# Patient Record
Sex: Male | Born: 1940 | Race: White | Hispanic: No | Marital: Married | Smoking: Never smoker
Health system: Southern US, Community
[De-identification: ages and names within clinical notes are randomized; demographics above are authoritative.]

## PROBLEM LIST (undated history)

## (undated) DIAGNOSIS — C801 Malignant (primary) neoplasm, unspecified: Secondary | ICD-10-CM

## (undated) DIAGNOSIS — I10 Essential (primary) hypertension: Secondary | ICD-10-CM

## (undated) DIAGNOSIS — Z8673 Personal history of transient ischemic attack (TIA), and cerebral infarction without residual deficits: Secondary | ICD-10-CM

## (undated) DIAGNOSIS — I219 Acute myocardial infarction, unspecified: Secondary | ICD-10-CM

## (undated) DIAGNOSIS — R42 Dizziness and giddiness: Secondary | ICD-10-CM

## (undated) DIAGNOSIS — N184 Chronic kidney disease, stage 4 (severe): Secondary | ICD-10-CM

## (undated) DIAGNOSIS — E785 Hyperlipidemia, unspecified: Secondary | ICD-10-CM

## (undated) DIAGNOSIS — I951 Orthostatic hypotension: Secondary | ICD-10-CM

## (undated) DIAGNOSIS — R0602 Shortness of breath: Secondary | ICD-10-CM

## (undated) DIAGNOSIS — F0390 Unspecified dementia without behavioral disturbance: Secondary | ICD-10-CM

## (undated) DIAGNOSIS — D649 Anemia, unspecified: Secondary | ICD-10-CM

## (undated) DIAGNOSIS — Z941 Heart transplant status: Secondary | ICD-10-CM

## (undated) DIAGNOSIS — I639 Cerebral infarction, unspecified: Secondary | ICD-10-CM

## (undated) DIAGNOSIS — E119 Type 2 diabetes mellitus without complications: Secondary | ICD-10-CM

## (undated) DIAGNOSIS — K439 Ventral hernia without obstruction or gangrene: Secondary | ICD-10-CM

## (undated) DIAGNOSIS — Z9289 Personal history of other medical treatment: Secondary | ICD-10-CM

## (undated) DIAGNOSIS — I2510 Atherosclerotic heart disease of native coronary artery without angina pectoris: Secondary | ICD-10-CM

## (undated) DIAGNOSIS — R51 Headache: Secondary | ICD-10-CM

## (undated) HISTORY — PX: CATARACT EXTRACTION: SUR2

## (undated) HISTORY — PX: EYE SURGERY: SHX253

## (undated) HISTORY — PX: CORONARY ARTERY BYPASS GRAFT: SHX141

## (undated) HISTORY — PX: HEART TRANSPLANT: SHX268

## (undated) HISTORY — PX: SKIN GRAFT FULL THICKNESS LEG: SUR1299

## (undated) HISTORY — PX: CARDIAC SURGERY: SHX584

## (undated) HISTORY — PX: VASECTOMY: SHX75

---

## 2013-06-20 DIAGNOSIS — I639 Cerebral infarction, unspecified: Secondary | ICD-10-CM

## 2013-06-20 HISTORY — DX: Cerebral infarction, unspecified: I63.9

## 2014-06-20 ENCOUNTER — Encounter (HOSPITAL_COMMUNITY): Payer: Self-pay | Admitting: Emergency Medicine

## 2014-06-20 ENCOUNTER — Inpatient Hospital Stay (HOSPITAL_COMMUNITY)
Admission: EM | Admit: 2014-06-20 | Discharge: 2014-06-25 | DRG: 682 | Disposition: A | Discharge status: Home / Self Care | Payer: 59 | Attending: Internal Medicine | Admitting: Internal Medicine

## 2014-06-20 DIAGNOSIS — D631 Anemia in chronic kidney disease: Secondary | ICD-10-CM | POA: Diagnosis present

## 2014-06-20 DIAGNOSIS — Z951 Presence of aortocoronary bypass graft: Secondary | ICD-10-CM | POA: Diagnosis not present

## 2014-06-20 DIAGNOSIS — IMO0002 Reserved for concepts with insufficient information to code with codable children: Secondary | ICD-10-CM | POA: Diagnosis present

## 2014-06-20 DIAGNOSIS — N039 Chronic nephritic syndrome with unspecified morphologic changes: Secondary | ICD-10-CM

## 2014-06-20 DIAGNOSIS — I2510 Atherosclerotic heart disease of native coronary artery without angina pectoris: Secondary | ICD-10-CM | POA: Diagnosis present

## 2014-06-20 DIAGNOSIS — Z833 Family history of diabetes mellitus: Secondary | ICD-10-CM | POA: Diagnosis not present

## 2014-06-20 DIAGNOSIS — E440 Moderate protein-calorie malnutrition: Secondary | ICD-10-CM | POA: Diagnosis present

## 2014-06-20 DIAGNOSIS — E119 Type 2 diabetes mellitus without complications: Secondary | ICD-10-CM | POA: Diagnosis present

## 2014-06-20 DIAGNOSIS — E039 Hypothyroidism, unspecified: Secondary | ICD-10-CM

## 2014-06-20 DIAGNOSIS — N179 Acute kidney failure, unspecified: Secondary | ICD-10-CM

## 2014-06-20 DIAGNOSIS — Z8249 Family history of ischemic heart disease and other diseases of the circulatory system: Secondary | ICD-10-CM

## 2014-06-20 DIAGNOSIS — N186 End stage renal disease: Secondary | ICD-10-CM | POA: Diagnosis present

## 2014-06-20 DIAGNOSIS — B259 Cytomegaloviral disease, unspecified: Secondary | ICD-10-CM | POA: Diagnosis present

## 2014-06-20 DIAGNOSIS — I10 Essential (primary) hypertension: Secondary | ICD-10-CM | POA: Diagnosis not present

## 2014-06-20 DIAGNOSIS — Z9849 Cataract extraction status, unspecified eye: Secondary | ICD-10-CM

## 2014-06-20 DIAGNOSIS — I119 Hypertensive heart disease without heart failure: Secondary | ICD-10-CM | POA: Diagnosis present

## 2014-06-20 DIAGNOSIS — E1165 Type 2 diabetes mellitus with hyperglycemia: Secondary | ICD-10-CM | POA: Diagnosis present

## 2014-06-20 DIAGNOSIS — E7800 Pure hypercholesterolemia, unspecified: Secondary | ICD-10-CM

## 2014-06-20 DIAGNOSIS — E785 Hyperlipidemia, unspecified: Secondary | ICD-10-CM | POA: Diagnosis present

## 2014-06-20 DIAGNOSIS — Z7982 Long term (current) use of aspirin: Secondary | ICD-10-CM | POA: Diagnosis not present

## 2014-06-20 DIAGNOSIS — I951 Orthostatic hypotension: Secondary | ICD-10-CM | POA: Diagnosis present

## 2014-06-20 DIAGNOSIS — I129 Hypertensive chronic kidney disease with stage 1 through stage 4 chronic kidney disease, or unspecified chronic kidney disease: Secondary | ICD-10-CM

## 2014-06-20 DIAGNOSIS — I120 Hypertensive chronic kidney disease with stage 5 chronic kidney disease or end stage renal disease: Secondary | ICD-10-CM | POA: Diagnosis not present

## 2014-06-20 DIAGNOSIS — Z941 Heart transplant status: Secondary | ICD-10-CM

## 2014-06-20 DIAGNOSIS — N189 Chronic kidney disease, unspecified: Secondary | ICD-10-CM

## 2014-06-20 DIAGNOSIS — E118 Type 2 diabetes mellitus with unspecified complications: Secondary | ICD-10-CM

## 2014-06-20 DIAGNOSIS — Z8673 Personal history of transient ischemic attack (TIA), and cerebral infarction without residual deficits: Secondary | ICD-10-CM | POA: Diagnosis not present

## 2014-06-20 DIAGNOSIS — Z79899 Other long term (current) drug therapy: Secondary | ICD-10-CM

## 2014-06-20 DIAGNOSIS — N184 Chronic kidney disease, stage 4 (severe): Secondary | ICD-10-CM

## 2014-06-20 HISTORY — DX: Orthostatic hypotension: I95.1

## 2014-06-20 HISTORY — DX: Essential (primary) hypertension: I10

## 2014-06-20 HISTORY — DX: Hypercalcemia: E83.52

## 2014-06-20 HISTORY — DX: Hyperlipidemia, unspecified: E78.5

## 2014-06-20 HISTORY — DX: Personal history of transient ischemic attack (TIA), and cerebral infarction without residual deficits: Z86.73

## 2014-06-20 HISTORY — DX: Cerebral infarction, unspecified: I63.9

## 2014-06-20 HISTORY — DX: Heart transplant status: Z94.1

## 2014-06-20 HISTORY — DX: Ventral hernia without obstruction or gangrene: K43.9

## 2014-06-20 HISTORY — DX: Type 2 diabetes mellitus without complications: E11.9

## 2014-06-20 HISTORY — DX: Chronic kidney disease, stage 4 (severe): N18.4

## 2014-06-20 LAB — CBC
HCT: 33.4 % — ABNORMAL LOW (ref 39.0–52.0)
HEMOGLOBIN: 10.3 g/dL — AB (ref 13.0–17.0)
MCH: 29.8 pg (ref 26.0–34.0)
MCHC: 30.8 g/dL (ref 30.0–36.0)
MCV: 96.5 fL (ref 78.0–100.0)
Platelets: 183 10*3/uL (ref 150–400)
RBC: 3.46 MIL/uL — ABNORMAL LOW (ref 4.22–5.81)
RDW: 14.4 % (ref 11.5–15.5)
WBC: 6.7 10*3/uL (ref 4.0–10.5)

## 2014-06-20 LAB — URINALYSIS, ROUTINE W REFLEX MICROSCOPIC
Bilirubin Urine: NEGATIVE
Glucose, UA: 100 mg/dL — AB
Ketones, ur: NEGATIVE mg/dL
Leukocytes, UA: NEGATIVE
NITRITE: NEGATIVE
Protein, ur: >300 mg/dL — AB
SPECIFIC GRAVITY, URINE: 1.018 (ref 1.005–1.030)
Urobilinogen, UA: 0.2 mg/dL (ref 0.0–1.0)
pH: 7 (ref 5.0–8.0)

## 2014-06-20 LAB — BASIC METABOLIC PANEL
Anion gap: 14 (ref 5–15)
BUN: 60 mg/dL — AB (ref 6–23)
CALCIUM: 8.4 mg/dL (ref 8.4–10.5)
CO2: 22 meq/L (ref 19–32)
CREATININE: 4.76 mg/dL — AB (ref 0.50–1.35)
Chloride: 103 mEq/L (ref 96–112)
GFR calc Af Amer: 13 mL/min — ABNORMAL LOW (ref >90)
GFR calc non Af Amer: 11 mL/min — ABNORMAL LOW (ref >90)
Glucose, Bld: 159 mg/dL — ABNORMAL HIGH (ref 70–99)
Potassium: 4.6 mEq/L (ref 3.7–5.3)
Sodium: 139 mEq/L (ref 137–147)

## 2014-06-20 LAB — MRSA PCR SCREENING: MRSA by PCR: NEGATIVE

## 2014-06-20 LAB — URINE MICROSCOPIC-ADD ON

## 2014-06-20 LAB — GLUCOSE, CAPILLARY: Glucose-Capillary: 113 mg/dL — ABNORMAL HIGH (ref 70–99)

## 2014-06-20 LAB — TROPONIN I

## 2014-06-20 MED ORDER — SODIUM CHLORIDE 0.45 % IV SOLN
INTRAVENOUS | Status: DC
  Administered 2014-06-20: 22:00:00 via INTRAVENOUS

## 2014-06-20 MED ORDER — HYDRALAZINE HCL 20 MG/ML IJ SOLN
5.0000 mg | Freq: Once | INTRAMUSCULAR | Status: AC
  Administered 2014-06-20: 5 mg via INTRAVENOUS
  Filled 2014-06-20: qty 1

## 2014-06-20 MED ORDER — MYCOPHENOLATE MOFETIL 250 MG PO CAPS
500.0000 mg | ORAL_CAPSULE | Freq: Every day | ORAL | Status: DC
  Administered 2014-06-21 – 2014-06-25 (×5): 500 mg via ORAL
  Filled 2014-06-20 (×5): qty 2

## 2014-06-20 MED ORDER — LABETALOL HCL 200 MG PO TABS
200.0000 mg | ORAL_TABLET | Freq: Three times a day (TID) | ORAL | Status: DC
  Administered 2014-06-20: 200 mg via ORAL
  Filled 2014-06-20: qty 1

## 2014-06-20 MED ORDER — FERROUS SULFATE 325 (65 FE) MG PO TABS
325.0000 mg | ORAL_TABLET | Freq: Three times a day (TID) | ORAL | Status: DC
  Administered 2014-06-21 – 2014-06-25 (×13): 325 mg via ORAL
  Filled 2014-06-20 (×16): qty 1

## 2014-06-20 MED ORDER — SODIUM CHLORIDE 0.9 % IV BOLUS (SEPSIS)
1000.0000 mL | Freq: Once | INTRAVENOUS | Status: AC
  Administered 2014-06-20: 1000 mL via INTRAVENOUS

## 2014-06-20 MED ORDER — SACCHAROMYCES BOULARDII 250 MG PO CAPS
250.0000 mg | ORAL_CAPSULE | ORAL | Status: DC
  Administered 2014-06-21 – 2014-06-25 (×3): 250 mg via ORAL
  Filled 2014-06-20 (×3): qty 1

## 2014-06-20 MED ORDER — ACETAMINOPHEN 325 MG PO TABS
650.0000 mg | ORAL_TABLET | Freq: Four times a day (QID) | ORAL | Status: DC | PRN
  Administered 2014-06-21 – 2014-06-24 (×3): 650 mg via ORAL
  Filled 2014-06-20 (×4): qty 2

## 2014-06-20 MED ORDER — METOPROLOL TARTRATE 25 MG PO TABS: 75.0000 mg | ORAL_TABLET | Freq: Two times a day (BID) | ORAL | Status: DC

## 2014-06-20 MED ORDER — INSULIN ASPART 100 UNIT/ML ~~LOC~~ SOLN
0.0000 [IU] | Freq: Three times a day (TID) | SUBCUTANEOUS | Status: DC
  Administered 2014-06-21: 1 [IU] via SUBCUTANEOUS
  Administered 2014-06-22 (×2): 2 [IU] via SUBCUTANEOUS
  Administered 2014-06-23: 1 [IU] via SUBCUTANEOUS
  Administered 2014-06-24: 2 [IU] via SUBCUTANEOUS

## 2014-06-20 MED ORDER — HYDRALAZINE HCL 50 MG PO TABS
100.0000 mg | ORAL_TABLET | Freq: Three times a day (TID) | ORAL | Status: DC
  Administered 2014-06-21 – 2014-06-25 (×13): 100 mg via ORAL
  Filled 2014-06-20 (×15): qty 2

## 2014-06-20 MED ORDER — ACETAMINOPHEN 650 MG RE SUPP: 650.0000 mg | Freq: Four times a day (QID) | RECTAL | Status: DC | PRN

## 2014-06-20 MED ORDER — LEVOTHYROXINE SODIUM 75 MCG PO TABS
75.0000 ug | ORAL_TABLET | Freq: Every day | ORAL | Status: DC
  Administered 2014-06-21 – 2014-06-25 (×5): 75 ug via ORAL
  Filled 2014-06-20 (×6): qty 1

## 2014-06-20 MED ORDER — ASPIRIN EC 325 MG PO TBEC
325.0000 mg | DELAYED_RELEASE_TABLET | Freq: Every day | ORAL | Status: DC
  Administered 2014-06-20 – 2014-06-24 (×5): 325 mg via ORAL
  Filled 2014-06-20 (×6): qty 1

## 2014-06-20 MED ORDER — MYCOPHENOLATE MOFETIL 250 MG PO CAPS
250.0000 mg | ORAL_CAPSULE | Freq: Every day | ORAL | Status: DC
  Administered 2014-06-20 – 2014-06-24 (×5): 250 mg via ORAL
  Filled 2014-06-20 (×7): qty 1

## 2014-06-20 MED ORDER — HEPARIN SODIUM (PORCINE) 5000 UNIT/ML IJ SOLN
5000.0000 [IU] | Freq: Three times a day (TID) | INTRAMUSCULAR | Status: DC
  Administered 2014-06-20 – 2014-06-25 (×14): 5000 [IU] via SUBCUTANEOUS
  Filled 2014-06-20 (×17): qty 1

## 2014-06-20 MED ORDER — BACITRACIN-NEOMYCIN-POLYMYXIN 400-5-5000 EX OINT
TOPICAL_OINTMENT | Freq: Every day | CUTANEOUS | Status: DC
  Administered 2014-06-21 – 2014-06-23 (×3): 1 via TOPICAL

## 2014-06-20 MED ORDER — SIMVASTATIN 10 MG PO TABS: 5.0000 mg | ORAL_TABLET | Freq: Every day | ORAL | Status: DC

## 2014-06-20 MED ORDER — PANTOPRAZOLE SODIUM 40 MG PO TBEC
40.0000 mg | DELAYED_RELEASE_TABLET | Freq: Every day | ORAL | Status: DC
  Administered 2014-06-20 – 2014-06-25 (×6): 40 mg via ORAL
  Filled 2014-06-20 (×6): qty 1

## 2014-06-20 MED ORDER — FLUOXETINE HCL 20 MG PO CAPS
20.0000 mg | ORAL_CAPSULE | Freq: Every day | ORAL | Status: DC
  Administered 2014-06-21 – 2014-06-25 (×5): 20 mg via ORAL
  Filled 2014-06-20 (×6): qty 1

## 2014-06-20 MED ORDER — VITAMIN B-12 1000 MCG PO TABS
1000.0000 ug | ORAL_TABLET | Freq: Every day | ORAL | Status: DC
  Administered 2014-06-21 – 2014-06-25 (×5): 1000 ug via ORAL
  Filled 2014-06-20 (×6): qty 1

## 2014-06-20 MED ORDER — CYCLOSPORINE MODIFIED (NEORAL) 25 MG PO CAPS
50.0000 mg | ORAL_CAPSULE | Freq: Two times a day (BID) | ORAL | Status: DC
  Administered 2014-06-20 – 2014-06-25 (×10): 50 mg via ORAL
  Filled 2014-06-20 (×12): qty 2

## 2014-06-20 MED ORDER — VITAMIN D3 25 MCG (1000 UNIT) PO TABS
1000.0000 [IU] | ORAL_TABLET | Freq: Every day | ORAL | Status: DC
  Administered 2014-06-21 – 2014-06-24 (×4): 1000 [IU] via ORAL
  Filled 2014-06-20 (×5): qty 1

## 2014-06-20 NOTE — H&P (Addendum)
History and Physical:    [name] [name] YJE:[id number] DOB: 1941-08-14 DOA: 06/20/2014  Referring physician:  Dr. Canary Brim PCP: No primary provider on file.   Chief Complaint:  Abnormal labs and hypertension  History of Present Illness:   [name] [name] is an 73 y.o. male  with known history of heart transplant in 2001.  Subsequent medical conditions are hypertension with usual systolic BP in [id number].  His cardiologist recently increased his metoprolol from 50mg  -75 mg bib and his hydralazine from 50 to 100 mg TID.  His nephrologist has been following his CRT for some time. His usual value is 3.9 but Thursday labs were drawn and today when the results were checked he was up to 5. 1. The moving truck was packed and so he was told to report to the ED on arrival into [place].  In Ed  CRt was 4.76.  He was completely asymptomatic but his blood pressure was in the 200's .  He denied chest pain, blurry vision , or headache.  He has been in the 200's before.  He has not been eating or drinking today and he admits that he did not take his BP meds last evening or his neoral because they could not find the meds.   ROS:   Constitutional: No fever, no chills;  Appetite normal; No weight loss, no weight gain, no fatigue.  HEENT: No blurry vision, no diplopia, no pharyngitis, no dysphagia CV: No chest pain, no palpitations, no PND, no orthopnea, trace edema.  Resp: No SOB, no cough, no pleuritic pain. GI: No nausea, no vomiting, no diarrhea, no melena, no hematochezia, no constipation last movement this am., no abdominal pain.  GU: No dysuria, no hematuria, no frequency, no urgency. MSK: no myalgias, no arthralgias.  Neuro:  H/O headache, no focal neurological deficits, no history of seizures.  Psych: treated for depression, no anxiety.  Endo: No heat intolerance, no cold intolerance, no polyuria, no polydipsia  Skin: No rashes, no skin lesions.  Heme: No easy bruising.  Travel history: Drove from  [place] today, moving to [place].   Past Medical History:   Past Medical History  Diagnosis Date  . Renal disorder   . Hypertension   . Diabetes mellitus without complication   . Stroke   . Hypercalcemia     Past Surgical History:   Past Surgical History  Procedure Laterality Date  . Cardiac surgery    . Heart transplant      Social History:   History   Social History  . Marital Status: Married    Spouse Name: N/A    Number of Children: N/A  . Years of Education: N/A   Occupational History  . Education officer, community    Social History Main Topics  . Smoking status: Never Smoker   . Smokeless tobacco: Not on file  . Alcohol Use: No  . Drug Use: No  . Sexual Activity: Not on file   Other Topics Concern  . Not on file   Social History Narrative  . No narrative on file    Family history:   Mom and dad both deceased had heart failure , dm, hypertension. Father had traumatic injury and became paraplegic prior to death.  Allergies   Norvasc  Current Medications:   Prior to Admission medications   Medication Sig Start Date End Date Taking? Authorizing Provider  acetaminophen (TYLENOL) 500 MG tablet Take 1,000 mg by mouth every 6 (six) hours as needed  for mild pain.   Yes Historical Provider, MD  amoxicillin (AMOXIL) 875 MG tablet Take 875 mg by mouth 2 (two) times daily as needed (for sinus's).   Yes Historical Provider, MD  aspirin EC 325 MG tablet Take 325 mg by mouth at bedtime.   Yes Historical Provider, MD  cholecalciferol (VITAMIN D) 1000 UNITS tablet Take 1,000 Units by mouth daily with breakfast.   Yes Historical Provider, MD  cycloSPORINE modified (NEORAL) 50 MG capsule Take 50 mg by mouth 2 (two) times daily.   Yes Historical Provider, MD  diphenoxylate-atropine (LOMOTIL) 2.5-0.025 MG per tablet Take 1 tablet by mouth 4 (four) times daily as needed for diarrhea or loose stools.   Yes Historical Provider, MD  Epoetin Alfa (PROCRIT IJ)  Inject as directed every 30 (thirty) days.   Yes Historical Provider, MD  ferrous sulfate 325 (65 FE) MG tablet Take 325 mg by mouth 3 (three) times daily with meals.   Yes Historical Provider, MD  FLUoxetine (PROZAC) 20 MG capsule Take 20 mg by mouth daily with breakfast.   Yes Historical Provider, MD  glimepiride (AMARYL) 1 MG tablet Take 0.5 mg by mouth daily as needed (for high blood sugar). Takes if blood sugar is over 120 in the morning   Yes Historical Provider, MD  hydrALAZINE (APRESOLINE) 100 MG tablet Take 100 mg by mouth 3 (three) times daily.   Yes Historical Provider, MD  lansoprazole (PREVACID) 30 MG capsule Take 30 mg by mouth 2 (two) times daily before a meal.   Yes Historical Provider, MD  levothyroxine (SYNTHROID, LEVOTHROID) 75 MCG tablet Take 75 mcg by mouth daily before breakfast.   Yes Historical Provider, MD  magnesium oxide (MAG-OX) 400 MG tablet Take 800 mg by mouth 2 (two) times daily.   Yes Historical Provider, MD  metoprolol (LOPRESSOR) 50 MG tablet Take 75 mg by mouth 2 (two) times daily.    Yes Historical Provider, MD  mycophenolate (CELLCEPT) 250 MG capsule Take 250-500 mg by mouth 2 (two) times daily. Takes 500mg  in the morning and 250mg  in the evening   Yes Historical Provider, MD  pravastatin (PRAVACHOL) 10 MG tablet Take 10 mg by mouth daily with breakfast.   Yes Historical Provider, MD  tiZANidine (ZANAFLEX) 4 MG tablet Take 4 mg by mouth every 6 (six) hours as needed for muscle spasms.   Yes Historical Provider, MD  vitamin B-12 (CYANOCOBALAMIN) 1000 MCG tablet Take 1,000 mcg by mouth daily with breakfast.   Yes Historical Provider, MD    Physical Exam:   Filed Vitals:   06/20/14 1922 06/20/14 1930 06/20/14 2002 06/20/14 2015  BP: 236/81 229/83  236/88  Pulse: 65     Temp:   97.9 F (36.6 C)   TempSrc:   Oral   Resp:  16  22  SpO2:         Physical Exam: Blood pressure 236/88, pulse 65, temperature 97.9 F (36.6 C), temperature source Oral, resp.  rate 22, SpO2 100.00%. Gen: No acute distress. Head: Normocephalic, atraumatic. Eyes: PERRL, EOMI, sclerae nonicteric. Mouth: Oropharynx moist, some missing teeth on upper palate, no oral lesions Neck: Supple, no thyromegaly, no lymphadenopathy, no jugular venous distention. Chest: Lungs clear , no RRW CV: Heart sounds distant but regular, no MRG noted Abdomen: Soft, nontender, nondistended with normal active bowel sounds. 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 Neuro: Alert and oriented times 3; cranial nerves II through XII grossly intact. Psych: Mood and affect normal.   Data Review:    Labs: Basic Metabolic Panel:  Recent Labs Lab 06/20/14 1739  NA 139  K 4.6  CL 103  CO2 22  GLUCOSE 159*  BUN 60*  CREATININE 4.76*  CALCIUM 8.4     CBC:  Recent Labs Lab 06/20/14 1739  WBC 6.7  HGB 10.3*  HCT 33.4*  MCV 96.5  PLT 183   Cardiac Enzymes:  Recent Labs Lab 06/20/14 1739  TROPONINI <0.30     CBG: To be measured.  Patient takes amaryl prn.  Will hold in light of worsening renal function and [place] SSI if CBG elevated.  Radiographic Studies: Not Indicated at this time. [place] of Renals ordered for am.   EKG: Independently reviewed.  Sinus with RBB incomplete   Assessment/Plan:   Principle Problem: 1. Hypertensive chronic kidney disease:  Obtain Nephrology consult in am, obtain renal ultrasound in am . Gentle control of blood pressure overnight as this is not a new state for him and he is completely asymptomatic.  Discussed with Nephro- continue cellcept and neoral.  No neurological symptoms so no need for drug levels.  Patient was told by his Nephrologist he may need dialysis in the next 6 months.  He is open to dialysis if needed. Will leave phone  numbers for the patient's physicians in [place] on his chart. Discussed case with Cardiology- they recommend using labetalol in place of the metoprolol.  Please call in the am to obtain official consult as patient will need to establish care.  Active Problems:  2. Acute on chronic renal failure: as above    3.Type II or unspecified type diabetes mellitus with unspecified complication, uncontrolled: check HgA1c. Patient relates last number 5.6 . SSI . Hold oral hypoglycemics with renal failure.   4. Heart transplanted: Cards consult in am please call.  Continue Immunosuppressants. No neurologic symptoms to suggest toxicity.continue statin and aspirin.    5. Unspecified hypothyroidism- check TSH continue home synthroid   6. Hypercholesterolemia- continue statin    7. CMV (cytomegalovirus infection) status positive- JUST FYI . No symptoms to address. 8. Intermittent diarrhea responds to probiotic q other day   DVT prophylaxis: heparin SQ  Code Status: Limited- no cpr, no vent support if found without pulse or without respirations, would like the opportunity to recover and live a little longer so treat treatable issue with medications and vasopressors. Surrogate decision maker is his wife who is a Marine scientist. Patient has a living will . Family Communication: Ruta Hinds [phone], Luvenia Redden [phone] at the bedside. Disposition Plan: Home when stable.  Time spent: 745 pm- 900 pm Discussed with Dr. Melvia Heaps, consult requested for am. Discussed with on Call Cardiology: [place] consultation.  Billey Chang [facility] [phone]    If 7PM-7AM, please contact night-coverage [email or web address] Password [facility] 06/20/2014, 9:39 PM    *

## 2014-06-20 NOTE — ED Notes (Addendum)
Pt reports a headache since April from falling. He reports that he rates his everyday 6/10. He denies blurred vision or double vision. Patient is alert and oriented. He states he has not been eating or drinking very much lately.

## 2014-06-20 NOTE — ED Provider Notes (Signed)
CSN: [id number]     Arrival date & time 06/20/14  1634 History   First MD Initiated Contact with Patient 06/20/14 1722     Chief Complaint  Patient presents with  . Abnormal Lab  . Hypertension     (Consider location/radiation/quality/duration/timing/severity/associated sxs/prior Treatment) HPI Pt presenting with c/o being told he has an elevated creatinine of 5.1.  He saw his primary doctor in [place] earlier this week for a recheck and was notified that creatinine was elevated. He was in the process of moving, so moved to [place]- arrived last night and came to the ED today for evaluation.  He has hx of renal insufficiency but wife states creat has not been above 2 until this past couple of months.  He c/o feeling increased weakness over the past several months as well.   No sob, no chest pain, no leg swelling that is changed from his baseline.  He also has had difficulty controlling his blood pressure over the past several months- despite being on multiple BP meds.  There are no other associated systemic symptoms, there are no other alleviating or modifying factors.   Past Medical History  Diagnosis Date  . CKD III-IV     a. since transplant in 2001 with significant worsening since 06/2013.  Marland Kitchen Hypertension     a. Long h/o HTN, came off of all meds following transplant/wt loss-->meds resumed 06/2013, difficult to control since.  . Diabetes mellitus without complication   . History of stroke   . Hypercalcemia     a. 06/2013 Hospitalized in [place].  Marland Kitchen Heart transplanted     a. 1995 CABG x 4 in [place], [place];  b. 2001 Developed CHF;  c. 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 10/2000 Cardiac transplant @ [facility], [place];  e. Historically nl Bx and stress testing.  Last stress test ~ 5 yrs ago.  Marland Kitchen Hernia of abdominal wall   . Hyperlipidemia   . Orthostatic hypotension     a. Has tried support hose and compression stockings - prefers not to wear.   Past Surgical History  Procedure Laterality  Date  . Cardiac surgery    . Heart transplant    . Cataract extraction    . Vasectomy    . Skin graft full thickness leg      cat scratch that did not heal   Family History  Problem Relation Age of Onset  . Heart attack Father     died @ 68  . Heart attack Mother    History  Substance Use Topics  . Smoking status: Never Smoker   . Smokeless tobacco: Not on file  . Alcohol Use: No    Review of Systems ROS reviewed and all otherwise negative except for mentioned in HPI    Allergies  Norvasc  Home Medications   Prior to Admission medications   Medication Sig Start Date End Date Taking? Authorizing Provider  acetaminophen (TYLENOL) 500 MG tablet Take 1,000 mg by mouth every 6 (six) hours as needed for mild pain.   Yes Historical Provider, MD  amoxicillin (AMOXIL) 875 MG tablet Take 875 mg by mouth 2 (two) times daily as needed (for sinus's).   Yes Historical Provider, MD  aspirin EC 325 MG tablet Take 325 mg by mouth at bedtime.   Yes Historical Provider, MD  cholecalciferol (VITAMIN D) 1000 UNITS tablet Take 1,000 Units by mouth daily with breakfast.   Yes Historical Provider, MD  cycloSPORINE modified (NEORAL) 50 MG capsule  Take 50 mg by mouth 2 (two) times daily.   Yes Historical Provider, MD  diphenoxylate-atropine (LOMOTIL) 2.5-0.025 MG per tablet Take 1 tablet by mouth 4 (four) times daily as needed for diarrhea or loose stools.   Yes Historical Provider, MD  Epoetin Alfa (PROCRIT IJ) Inject as directed every 30 (thirty) days.   Yes Historical Provider, MD  ferrous sulfate 325 (65 FE) MG tablet Take 325 mg by mouth 3 (three) times daily with meals.   Yes Historical Provider, MD  FLUoxetine (PROZAC) 20 MG capsule Take 20 mg by mouth daily with breakfast.   Yes Historical Provider, MD  glimepiride (AMARYL) 1 MG tablet Take 0.5 mg by mouth daily as needed (for high blood sugar). Takes if blood sugar is over 120 in the morning   Yes Historical Provider, MD  hydrALAZINE  (APRESOLINE) 100 MG tablet Take 100 mg by mouth 3 (three) times daily.   Yes Historical Provider, MD  lansoprazole (PREVACID) 30 MG capsule Take 30 mg by mouth 2 (two) times daily before a meal.   Yes Historical Provider, MD  levothyroxine (SYNTHROID, LEVOTHROID) 75 MCG tablet Take 75 mcg by mouth daily before breakfast.   Yes Historical Provider, MD  magnesium oxide (MAG-OX) 400 MG tablet Take 800 mg by mouth 2 (two) times daily.   Yes Historical Provider, MD  metoprolol (LOPRESSOR) 50 MG tablet Take 75 mg by mouth 2 (two) times daily.    Yes Historical Provider, MD  mycophenolate (CELLCEPT) 250 MG capsule Take 250-500 mg by mouth 2 (two) times daily. Takes 500mg  in the morning and 250mg  in the evening   Yes Historical Provider, MD  pravastatin (PRAVACHOL) 10 MG tablet Take 10 mg by mouth daily with breakfast.   Yes Historical Provider, MD  tiZANidine (ZANAFLEX) 4 MG tablet Take 4 mg by mouth every 6 (six) hours as needed for muscle spasms.   Yes Historical Provider, MD  vitamin B-12 (CYANOCOBALAMIN) 1000 MCG tablet Take 1,000 mcg by mouth daily with breakfast.   Yes Historical Provider, MD   BP 234/71  Pulse 62  Temp(Src) 98 F (36.7 C) (Oral)  Resp 17  Ht 6\' 2"  (1.88 m)  Wt 159 lb 13.3 oz (72.5 kg)  BMI 20.51 kg/m2  SpO2 99% Vitals reviewed Physical Exam Physical Examination: General appearance - alert, well appearing, and in no distress Mental status - alert, oriented to person, place, and time Eyes - no conjunctival injection, no scleral icterus Mouth - mucous membranes moist, pharynx normal without lesions Chest - clear to auscultation, no wheezes, rales or rhonchi, symmetric air entry Heart - normal rate, regular rhythm, normal S1, S2, no murmurs, rubs, clicks or gallops Abdomen - soft, nontender, nondistended, no masses or organomegaly Extremities - peripheral pulses normal, bilateral 2+ pitting edema, no clubbing or cyanosis Skin - normal coloration and turgor, no rashes   ED  Course  Procedures (including critical care time)  CRITICAL CARE Performed by: Threasa Beards Total critical care time: 35 Critical care time was exclusive of separately billable procedures and treating other patients. Critical care was necessary to treat or prevent imminent or life-threatening deterioration. Critical care was time spent personally by me on the following activities: development of treatment plan with patient and/or surrogate as well as nursing, discussions with consultants, evaluation of patient's response to treatment, examination of patient, obtaining history from patient or surrogate, ordering and performing treatments and interventions, ordering and review of laboratory studies, ordering and review of radiographic studies, pulse oximetry and  re-evaluation of patient's condition. Labs Review Labs Reviewed  CBC - Abnormal; Notable for the following:    RBC 3.46 (*)    Hemoglobin 10.3 (*)    HCT 33.4 (*)    All other components within normal limits  BASIC METABOLIC PANEL - Abnormal; Notable for the following:    Glucose, Bld 159 (*)    BUN 60 (*)    Creatinine, Ser 4.76 (*)    GFR calc non Af Amer 11 (*)    GFR calc Af Amer 13 (*)    All other components within normal limits  URINALYSIS, ROUTINE W REFLEX MICROSCOPIC - Abnormal; Notable for the following:    Glucose, UA 100 (*)    Hgb urine dipstick TRACE (*)    Protein, ur >300 (*)    All other components within normal limits  BASIC METABOLIC PANEL - Abnormal; Notable for the following:    Glucose, Bld 156 (*)    BUN 57 (*)    Creatinine, Ser 4.91 (*)    Calcium 7.9 (*)    GFR calc non Af Amer 11 (*)    GFR calc Af Amer 12 (*)    All other components within normal limits  HEMOGLOBIN A1C - Abnormal; Notable for the following:    Hemoglobin A1C 6.3 (*)    Mean Plasma Glucose 134 (*)    All other components within normal limits  GLUCOSE, CAPILLARY - Abnormal; Notable for the following:    Glucose-Capillary 113  (*)    All other components within normal limits  IRON AND TIBC - Abnormal; Notable for the following:    TIBC 179 (*)    UIBC 120 (*)    All other components within normal limits  FERRITIN - Abnormal; Notable for the following:    Ferritin 419 (*)    All other components within normal limits  MRSA PCR SCREENING  TROPONIN I  URINE MICROSCOPIC-ADD ON  TSH  VITAMIN B12  FOLATE  SODIUM, URINE, RANDOM  CREATININE, URINE, RANDOM  CBC  RENAL FUNCTION PANEL  LIPID PANEL    Imaging Review US Renal  06/21/2014   CLINICAL DATA:  Acute on chronic renal failure  EXAM: RENAL/URINARY TRACT ULTRASOUND COMPLETE  COMPARISON:  None.  FINDINGS: Right Kidney:  Length: 11.0 cm. 8 mm interpolar calculus. No mass or hydronephrosis.  Left Kidney:  Length: 10.0 cm.  No mass or hydronephrosis.  Bladder:  Within normal limits.  IMPRESSION: 8 mm interpolar right renal calculus.  No hydronephrosis.   Electronically Signed   By: Julian Hy M.D.   On: 06/21/2014 09:21     EKG Interpretation   Date/Time:  Saturday June 20 2014 17:27:50 EDT Ventricular Rate:  65 PR Interval:  121 QRS Duration: 118 QT Interval:  471 QTC Calculation: 490 R Axis:   92 Text Interpretation:  Sinus rhythm Incomplete right bundle branch block No  old tracing to compare Confirmed by [facility]  MD, [place] [phone]) on 06/20/2014  10:30:34 PM      MDM   Final diagnoses:  Hypertensive chronic kidney disease, stage 1-4 or unspecified chronic kidney disease  Acute on chronic renal failure  Heart transplanted  Hypercholesterolemia  Malignant hypertension  Type II or unspecified type diabetes mellitus with unspecified complication, uncontrolled    Pt presenting with c/o elevated creatinine, hypertension in the setting of increased weakness and fatigue.  Pt started on IV fluids, given IV hydralazine for malignant hypertension in the ED.  Admitted to [facility] for further management.  Pt to go to step down bed.      Threasa Beards, MD 06/21/14 [phone]

## 2014-06-20 NOTE — ED Notes (Signed)
Unit called for report, charge nurse to call back to receive report.

## 2014-06-20 NOTE — ED Notes (Signed)
Pt ambulated to restroom with steady gait.

## 2014-06-20 NOTE — ED Notes (Signed)
Pt presents to ed with c/o elevated creatinine (5.1), last month was 3.9. Pt sts they just moved from [place] yesterday; per wife has hx of renal insufficiency, but his crt usually was never over 2 until last month. Pt is hypertensive with SBP over 200. Wife also sts pt is getting increasingly weak for the last 3-4 months. Also sts pt had heart transplant in 2001 which he sts he has no issues with.

## 2014-06-21 ENCOUNTER — Inpatient Hospital Stay (HOSPITAL_COMMUNITY): Payer: 59

## 2014-06-21 ENCOUNTER — Encounter (HOSPITAL_COMMUNITY): Payer: Self-pay | Admitting: Nurse Practitioner

## 2014-06-21 DIAGNOSIS — I10 Essential (primary) hypertension: Secondary | ICD-10-CM | POA: Diagnosis present

## 2014-06-21 DIAGNOSIS — I951 Orthostatic hypotension: Secondary | ICD-10-CM | POA: Diagnosis present

## 2014-06-21 DIAGNOSIS — Z941 Heart transplant status: Secondary | ICD-10-CM

## 2014-06-21 DIAGNOSIS — I119 Hypertensive heart disease without heart failure: Secondary | ICD-10-CM | POA: Diagnosis present

## 2014-06-21 DIAGNOSIS — E1165 Type 2 diabetes mellitus with hyperglycemia: Secondary | ICD-10-CM

## 2014-06-21 DIAGNOSIS — E118 Type 2 diabetes mellitus with unspecified complications: Secondary | ICD-10-CM

## 2014-06-21 DIAGNOSIS — IMO0002 Reserved for concepts with insufficient information to code with codable children: Secondary | ICD-10-CM

## 2014-06-21 DIAGNOSIS — N184 Chronic kidney disease, stage 4 (severe): Secondary | ICD-10-CM | POA: Diagnosis present

## 2014-06-21 DIAGNOSIS — I517 Cardiomegaly: Secondary | ICD-10-CM

## 2014-06-21 LAB — IRON AND TIBC
Iron: 59 ug/dL (ref 42–135)
Saturation Ratios: 33 % (ref 20–55)
TIBC: 179 ug/dL — ABNORMAL LOW (ref 215–435)
UIBC: 120 ug/dL — ABNORMAL LOW (ref 125–400)

## 2014-06-21 LAB — HEMOGLOBIN A1C
Hgb A1c MFr Bld: 6.3 % — ABNORMAL HIGH (ref <5.7)
MEAN PLASMA GLUCOSE: 134 mg/dL — AB (ref <117)

## 2014-06-21 LAB — BASIC METABOLIC PANEL
ANION GAP: 12 (ref 5–15)
BUN: 57 mg/dL — ABNORMAL HIGH (ref 6–23)
CO2: 23 meq/L (ref 19–32)
Calcium: 7.9 mg/dL — ABNORMAL LOW (ref 8.4–10.5)
Chloride: 106 mEq/L (ref 96–112)
Creatinine, Ser: 4.91 mg/dL — ABNORMAL HIGH (ref 0.50–1.35)
GFR calc Af Amer: 12 mL/min — ABNORMAL LOW (ref >90)
GFR calc non Af Amer: 11 mL/min — ABNORMAL LOW (ref >90)
GLUCOSE: 156 mg/dL — AB (ref 70–99)
POTASSIUM: 4.2 meq/L (ref 3.7–5.3)
SODIUM: 141 meq/L (ref 137–147)

## 2014-06-21 LAB — FERRITIN: FERRITIN: 419 ng/mL — AB (ref 22–322)

## 2014-06-21 LAB — GLUCOSE, CAPILLARY
GLUCOSE-CAPILLARY: 144 mg/dL — AB (ref 70–99)
Glucose-Capillary: 80 mg/dL (ref 70–99)
Glucose-Capillary: 119 mg/dL — ABNORMAL HIGH (ref 70–99)

## 2014-06-21 LAB — TSH: TSH: 2.96 u[IU]/mL (ref 0.350–4.500)

## 2014-06-21 LAB — FOLATE: Folate: 4.3 ng/mL

## 2014-06-21 LAB — VITAMIN B12: VITAMIN B 12: 573 pg/mL (ref 211–911)

## 2014-06-21 MED ORDER — GI COCKTAIL ~~LOC~~
30.0000 mL | Freq: Three times a day (TID) | ORAL | Status: DC | PRN
  Administered 2014-06-21 (×2): 30 mL via ORAL
  Filled 2014-06-21 (×2): qty 30

## 2014-06-21 MED ORDER — FUROSEMIDE 40 MG PO TABS
40.0000 mg | ORAL_TABLET | Freq: Two times a day (BID) | ORAL | Status: DC
  Administered 2014-06-21 – 2014-06-25 (×9): 40 mg via ORAL
  Filled 2014-06-21 (×11): qty 1

## 2014-06-21 MED ORDER — PRAVASTATIN SODIUM 10 MG PO TABS
10.0000 mg | ORAL_TABLET | Freq: Every day | ORAL | Status: DC
  Administered 2014-06-21 – 2014-06-25 (×5): 10 mg via ORAL
  Filled 2014-06-21 (×5): qty 1

## 2014-06-21 MED ORDER — LABETALOL HCL 200 MG PO TABS
300.0000 mg | ORAL_TABLET | Freq: Three times a day (TID) | ORAL | Status: DC
  Administered 2014-06-21 (×3): 300 mg via ORAL
  Filled 2014-06-21 (×6): qty 1

## 2014-06-21 NOTE — Progress Notes (Signed)
Echocardiogram 2D Echocardiogram has been performed.  Doyle Askew 06/21/2014, 10:47 AM

## 2014-06-21 NOTE — Consult Note (Signed)
CARDIOLOGY CONSULT NOTE   Patient ID: [name] [name] MRN: [id number], DOB/AGE: Aug 02, 1941   Admit date: 06/20/2014 Date of Consult: 06/21/2014   Primary Physician: No primary provider on file. Primary Cardiologist: new to    Pt. 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  Problem List  Past Medical History  Diagnosis Date  . CKD III-IV     a. since transplant in 2001 with significant worsening since 06/2013.  Marland Kitchen Hypertension     a. Long h/o HTN, came off of all meds following transplant/wt loss-->meds resumed 06/2013, difficult to control since.  . Diabetes mellitus without complication   . History of stroke   . Hypercalcemia     a. 06/2013 Hospitalized in [place].  Marland Kitchen Heart transplanted     a. 1995 CABG x 4 in [place], [place];  b. 2001 Developed CHF;  c. 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 10/2000 Cardiac transplant @ [facility], [place];  e. Historically nl Bx and stress testing.  Last stress test ~ 5 yrs ago.  Marland Kitchen Hernia of abdominal wall   . Hyperlipidemia   . Orthostatic hypotension     a. Has tried support hose and compression stockings - prefers not to wear.      Past Surgical History  Procedure Laterality Date  . Cardiac surgery    . Heart transplant    . Cataract extraction    . Vasectomy    . Skin graft full thickness leg      cat scratch that did not heal     Allergies  Allergies  Allergen Reactions  . Norvasc [Amlodipine Besylate] Swelling    Swelling of the lower body     HPI   41 y /o male with the above complex problem list.  His cardiac hx dates back to 1995, when he began to experience exertional chest pain and dyspnea.  He had an abnl stress test that led to cath revealing severe LM dzs.  He then underwent CABG x 4 in [place], [place].  He lived in [place] @ the time and he subsequently retired in 1996.  He and his wife moved to [place].  In  2001, he developed dyspnea and was found to have LV dysfxn.  He says that his cath apparently did not show significant graft disease.  He sought a second opinion and went back to see his prior cardiologist in [place] who then referred him to [facility] in [place], [place].  There, he was told that he would require a transplant.  He was apparently too sick for transplant initially but subsequently improved and a LVAD was placed 08/2000.  He then underwent successful cardiac transplant in 10/2000.  Post-op course was relatively uncomplicated.  He says that he had regular biopsies and never experienced any rejection.  His last bx was ~ 9 yrs ago, while his last stress test, he believes, was about 5 yrs ago.  He has been followed q27mos @ [facility] in [place] and has overall been doing well without chest pain or significant limitations in activities, though he does experience DOE with inclines.  This has been stable.  Following transplant, he was quite hypertensive but he dieted, lost weight, and was able to come off of both diabetic and antihypertensive meds.  He has had renal insufficiency since transplant and says that his creat was typically in the low 2's, but stable.  In August 2014, he was admitted to the hospital with hypercalcemia and hypertension.  He was placed back on antihypertensive meds including metoprolol and amlodipine.  Amlodipine was subsequently discontinued 2/2 significant lower ext edema and he has since been on hydralazine 100mg  TID and metoprolol 50mg  bid.  His BP is typically in the 170's @ rest but drops to the 130's with standing, at which point he feels lightheaded.  He has worn support and compression hose in the past but has not really tolerated wearing them due to the difficulty of getting them on/off.  He is able to control his degree of orthostasis however, simply by getting up more slowly. His blood pressures in his cardiologist's office for recorded at 140-160 on the most recent  visits.  Also since 06/2013, his creat has been climbing with a new nl in the 3's.  He was followed closely by nephrology in [facility].  Due to his and his wife's health, they decided to move to [facility] to be closer to their son.  In preparation for this move, they arranged for new primary care f/u however the first avail appt was October.  His nephrologist in [place] checked his creat on 7/30, and it was more elevated @ 5.1.  His nephrologist recommended that he have a creat checked @ a local ER in [place] when he arrives in [place].  As a result, upon coming to [place] yesterday, he presented to the [facility] ED for labs.  He was found to be markedly HTN with a BP of 242/96.  Creat returned @ 4.76.  He was not in any acute distress.  Given malignant HTN and acute on chronic renal failure, he was admitted for further evaluation.  He has been placed on labetalol and this has been titrated to 300 mg TID up to this point.  We have been asked to evaluate him given his HTN, significant cardiac hx, and also to assist in arranging for future outpt f/u.  He currently has no complaints. 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  Inpatient Medications  . aspirin EC  325 mg Oral QHS  . cholecalciferol  1,000 Units Oral Q breakfast  . cycloSPORINE modified  50 mg Oral BID  . ferrous sulfate  325 mg Oral TID WC  . FLUoxetine  20 mg Oral Q breakfast  . furosemide  40 mg Oral BID  . heparin  5,000 Units Subcutaneous 3 times per day  . hydrALAZINE  100 mg Oral TID  . insulin aspart  0-9 Units Subcutaneous TID WC  . labetalol  300 mg Oral TID  . levothyroxine  75 mcg Oral QAC breakfast  . mycophenolate  250 mg Oral QHS  . mycophenolate  500 mg Oral Daily  . neomycin-bacitracin-polymyxin   Topical Daily  . pantoprazole  40 mg Oral Daily  . pravastatin  10 mg Oral Daily  . saccharomyces boulardii  250 mg Oral QODAY  . vitamin B-12  1,000 mcg Oral Q breakfast    Family History Family History   Problem Relation Age of Onset  . Heart attack Father     died @ 1  . Heart attack Mother      Social History History   Social History  . Marital Status: Married    Spouse Name: N/A    Number of Children: N/A  . Years of Education: N/A   Occupational History  . Education officer, community    Social History Main  Topics  . Smoking status: Never Smoker   . Smokeless tobacco: Not on file  . Alcohol Use: No  . Drug Use: No  . Sexual Activity: Not on file   Other Topics Concern  . Not on file   Social History Narrative   Patient is from [place].  Moved to [place] in late '90's.  Moved to [place] 06/20/2014 to live closer to son.  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  Plans to continue to work now that he is in [place].     Review of Systems  General:  No chills, fever, night sweats or weight changes.  Cardiovascular:  +++ dyspnea on exertion with inclinces.  No chest pain, edema, orthopnea, palpitations, paroxysmal nocturnal dyspnea. Dermatological: No rash, lesions/masses Respiratory: No cough, +++ dyspnea with inclines. Urologic: No hematuria, dysuria Abdominal:   No nausea, vomiting, diarrhea, bright red blood per rectum, melena, or hematemesis Neurologic:  No visual changes, wkns, changes in mental status. All other systems reviewed and are otherwise negative except as noted above.  Physical Exam  Blood pressure 217/80, pulse 72, temperature 97.9 F (36.6 C), temperature source Oral, resp. rate 16, height 6\' 2"  (1.88 m), weight 159 lb 13.3 oz (72.5 kg), SpO2 97.00%.  General: Pleasant, NAD Psych: Normal affect. Neuro: Alert and oriented X 3. Moves all extremities spontaneously. HEENT: Normal  Neck: Supple without bruits or JVD. Lungs:  Resp regular and unlabored, clear to auscultation, healed median sternotomy scar Heart: RRR no s3, s4, or murmurs. Abdomen: Soft, non-tender, non-distended, BS + x 4.  Extremities: No clubbing, cyanosis or edema.  DP/PT/Radials 2+ and equal bilaterally.  Labs   Recent Labs  06/20/14 1739  TROPONINI <0.30   Lab Results  Component Value Date   WBC 6.7 06/20/2014   HGB 10.3* 06/20/2014   HCT 33.4* 06/20/2014   MCV 96.5 06/20/2014   PLT 183 06/20/2014     Recent Labs Lab 06/21/14 0355  NA 141  K 4.2  CL 106  CO2 23  BUN 57*  CREATININE 4.91*  CALCIUM 7.9*  GLUCOSE 156*   Radiology/Studies  US Renal  06/21/2014   CLINICAL DATA:  Acute on chronic renal failure  EXAM: RENAL/URINARY TRACT ULTRASOUND COMPLETE  COMPARISON:  None.  FINDINGS: Right Kidney:  Length: 11.0 cm. 8 mm interpolar calculus. No mass or hydronephrosis.  Left Kidney:  Length: 10.0 cm.  No mass or hydronephrosis.  Bladder:  Within normal limits.  IMPRESSION: 8 mm interpolar right renal calculus.  No hydronephrosis.   Electronically Signed   By: Julian Hy M.D.   On: 06/21/2014 09:21   ECG  Rsr, 65, inc rbbb, no acute st/t changes.  ASSESSMENT AND PLAN  1.  Accelerated HTN:  Pt has a long h/o HTN but was able to come off of all meds some time following his transplant in 2001.  Meds were resumed in 06/2013 and since then, BP's have been difficult to control.  Timing coincides with worsening renal failure.  He was on lopressor 50mg  bid and hydralazine 100mg  tid @ home previously and says that his resting pressures were typically about 165mmHg.  He also has relative orthostatic hypotension with pressures dropping to 130 with standing, resulting in lightheadedness.  He has worn support/compression hose in the past and prefers not to use them.  He presented yesterday to the ED for f/u of his creatinine and BP was markedly elevated @ 242/96.  Pressures have remained elevated since admission.  Labetalol was added and has since been titrated to 300mg  BID.  He remains on hydralazine 100mg  TID and lasix 40mg  BID was just added by nephrology.  Continue this regimen and assess outcome over the course of the day.  There is still room to  titrate labetalol further, though HR is currently in the mid-60's.  He had previously been tried on amlodipine resulting in lower ext edema.  He is not a candidate for ACE/ARB 2/2 acute on chronic renal failure.  If pressures cannot be reasonably managed with titration of current meds, we could consider minoxidil next.  We will have to watch for orthostatic Ss closely as we titrate meds. Recommend titrating blood pressures to his standing blood pressures.  I did not see in the review of records whether he has had a look done previously at his renal arteries. A renal artery Doppler could reveal whether he has any significant renal artery stenosis that could cause difficult to control blood pressure.  He also used Aleve briefly about 6 months ago but has not used any recently.  2.  H/O ICM s/p cardiac transplant in 2001:  He reports a relatively uneventful post-op course from a cardiac standpoint and has been able to be active @ home and continues to work.  He denies chest pain or dyspnea with normal activities, but does become dyspneic with inclines (stable over time).  Echocardiogram today shows preserved systolic function with moderate LVH We will arrange for outpatient f/u with Dr. Haroldine Laws in the [facility].  He remains on prior home doses of anti-rejection meds.  3. 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  Nephrology following.  4. 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 Monitor standing blood pressures and titrate medicines up. Target blood pressure will be around 364 systolic to 680 systolic. 2. Renal artery Doppler 3. He will followed by the chronic heart failure clinic in light of his cardiac transplantation   W. Tollie Eth, Brooke Bonito. MD [facility]

## 2014-06-21 NOTE — Consult Note (Signed)
Renal Service Consult Note [facility]  [name] [name] 06/21/2014 [name] Requesting Physician:  Dr Grandville Silos  Reason for Consult:  CKD pt w hx heart transplant admitted for acute on CRF and high BP's HPI: The patient is a 73 y.o. year-old with hx of heart transplant in 2001 also has CKD w baseline creat in 3-4 range.  He just moved to this area from [place] a few days ago.  He got word from his transplant doctor in [place] that his labwork which was done this week showed a new elevation in Cr to 5.1.  The patient was in process of moving and didn't have time to see his doctor in [place], so presented to ED yesterday with this history.  He has been advised that CKD is worsening and that he will probably require dialysis soon.  Highest BP's in ED were 242/96 range, still elevated now around 200/60.    Patient says that for the last year or so his BP has been high, difficult to control.  He developed edema from Norvasc in his legs so they are trying to stay away from that.  He was told by his heart doctor that they didn't want to use lasix.  His home BP meds are hydralazine 100 tid, Lopressor 75 bid.  He takes cyclosporine and Cellcept for his heart transplant. He has no tremors, confusion.  His CyA levels over the years have all been "good" according to patient. He rec'd routine heart biopsies for several years after the transplant and he had no problems with rejection.    Here for BP he is getting hydralazine and labetalol.  HR is 72.   ROS  denies use of nsaids  no n/v/d  no abd pain  no lethargy, fatigue or anorexia  no sob, leg swelling or cough  Past Medical History  Past Medical History  Diagnosis Date  . Renal disorder   . Hypertension   . Diabetes mellitus without complication   . Stroke   . Hypercalcemia     stopped antihypertensives that caused this.  . Heart transplanted     LVAD R/L  . Hernia of abdominal wall    Past Surgical History  Past  Surgical History  Procedure Laterality Date  . Cardiac surgery    . Heart transplant    . Cataract extraction    . Vasectomy    . Skin graft full thickness leg      cat scratch that did not heal   Family History No family history on file. Social History  reports that he has never smoked. He does not have any smokeless tobacco history on file. He reports that he does not drink alcohol or use illicit drugs. Allergies  Allergies  Allergen Reactions  . Norvasc [Amlodipine Besylate] Swelling    Swelling of the lower body    Home medications Prior to Admission medications   Medication Sig Start Date End Date Taking? Authorizing Provider  acetaminophen (TYLENOL) 500 MG tablet Take 1,000 mg by mouth every 6 (six) hours as needed for mild pain.   Yes Historical Provider, MD  amoxicillin (AMOXIL) 875 MG tablet Take 875 mg by mouth 2 (two) times daily as needed (for sinus's).   Yes Historical Provider, MD  aspirin EC 325 MG tablet Take 325 mg by mouth at bedtime.   Yes Historical Provider, MD  cholecalciferol (VITAMIN D) 1000 UNITS tablet Take 1,000 Units by mouth daily with breakfast.   Yes Historical Provider, MD  cycloSPORINE  modified (NEORAL) 50 MG capsule Take 50 mg by mouth 2 (two) times daily.   Yes Historical Provider, MD  diphenoxylate-atropine (LOMOTIL) 2.5-0.025 MG per tablet Take 1 tablet by mouth 4 (four) times daily as needed for diarrhea or loose stools.   Yes Historical Provider, MD  Epoetin Alfa (PROCRIT IJ) Inject as directed every 30 (thirty) days.   Yes Historical Provider, MD  ferrous sulfate 325 (65 FE) MG tablet Take 325 mg by mouth 3 (three) times daily with meals.   Yes Historical Provider, MD  FLUoxetine (PROZAC) 20 MG capsule Take 20 mg by mouth daily with breakfast.   Yes Historical Provider, MD  glimepiride (AMARYL) 1 MG tablet Take 0.5 mg by mouth daily as needed (for high blood sugar). Takes if blood sugar is over 120 in the morning   Yes Historical Provider, MD   hydrALAZINE (APRESOLINE) 100 MG tablet Take 100 mg by mouth 3 (three) times daily.   Yes Historical Provider, MD  lansoprazole (PREVACID) 30 MG capsule Take 30 mg by mouth 2 (two) times daily before a meal.   Yes Historical Provider, MD  levothyroxine (SYNTHROID, LEVOTHROID) 75 MCG tablet Take 75 mcg by mouth daily before breakfast.   Yes Historical Provider, MD  magnesium oxide (MAG-OX) 400 MG tablet Take 800 mg by mouth 2 (two) times daily.   Yes Historical Provider, MD  metoprolol (LOPRESSOR) 50 MG tablet Take 75 mg by mouth 2 (two) times daily.    Yes Historical Provider, MD  mycophenolate (CELLCEPT) 250 MG capsule Take 250-500 mg by mouth 2 (two) times daily. Takes 500mg  in the morning and 250mg  in the evening   Yes Historical Provider, MD  pravastatin (PRAVACHOL) 10 MG tablet Take 10 mg by mouth daily with breakfast.   Yes Historical Provider, MD  tiZANidine (ZANAFLEX) 4 MG tablet Take 4 mg by mouth every 6 (six) hours as needed for muscle spasms.   Yes Historical Provider, MD  vitamin B-12 (CYANOCOBALAMIN) 1000 MCG tablet Take 1,000 mcg by mouth daily with breakfast.   Yes Historical Provider, MD   Liver Function Tests No results found for this basename: AST, ALT, ALKPHOS, BILITOT, PROT, ALBUMIN,  in the last 168 hours No results found for this basename: LIPASE, AMYLASE,  in the last 168 hours CBC  Recent Labs Lab 06/20/14 1739  WBC 6.7  HGB 10.3*  HCT 33.4*  MCV 96.5  PLT 093   Basic Metabolic Panel  Recent Labs Lab 06/20/14 1739 06/21/14 0355  NA 139 141  K 4.6 4.2  CL 103 106  CO2 22 23  GLUCOSE 159* 156*  BUN 60* 57*  CREATININE 4.76* 4.91*  CALCIUM 8.4 7.9*    Filed Vitals:   06/21/14 0300 06/21/14 0400 06/21/14 0500 06/21/14 0600  BP: 199/70 189/76 207/71 202/58  Pulse: 74 76 72 72  Temp:  98.2 F (36.8 C)    TempSrc:  Oral    Resp: 18 20 18 16   Height:      Weight:      SpO2: 99% 99% 97% 97%   Exam Pleasant older adult male, no distress, calm No  rash, cyanosis or gangrene Sclera anicteric, throat clear No jvd, flat neck veins, no bruits Chest occ rales R base, o/w clear RRR no MRG, large mid sternal scar Abd is soft, NTND, no ascites or HSM Trace pretib edema bilat LE's, no ulcer or gangrene Pulses diminished in the feet Neuro is fully alert, Ox 3, nonfocal   UA- >300 prot,  0-2 wbc, 3-6 rbc K+ 4.2, CO2 23, BUN 57, Cr 4.91 today    Assessment: 1 Acute on CKD, vs progression of known underlying CKD.  Baseline creat mid 3's and today is 4.9.  At higher levels this does not reflect a huge loss of renal function.  He has no uremic signs or symptoms and won't require HD acutely it appears. We should try and control his BP better if we can.  His heart doctors wanted to avoid lasix but I don't know that this will be possible in the later stages of CKD.  I will d/w the heart surgery team here, but I suspect their concern is to avoid the risk of dehydration and CyA toxicity.  I suspect that vol excess due to CKD is driving the BP issues.Marland KitchenHe is brady cardic and we can't really use ACEi/ARB so there are limited options.  I don't like the idea of an alpha-blocker with a transplanted heart w regards to orthostatic hypotension and risk of syncope.  2 HTN, uncontrolled- as above 3 Heart transplant 2001 on CyA and Cellcept 4 HOH 5 DM2 on oral meds   Plan- will start lasix at 40 mg bid, see if this helps BP. Check daily creat to see where he levels off. Check CyA trough level.  Renal US to r/o obstruction. Can titrate labetalol to max 600 bid if HR will tolerate. No need for RRT now.  [name] MD (pgr) [phone]    ([phone] 06/21/2014, 7:57 AM

## 2014-06-21 NOTE — Progress Notes (Signed)
[facility] PROGRESS NOTE  [name] [name] IWP:[id number] DOB: 04-07-1941 DOA: 06/20/2014 PCP: No primary provider on file.  Assessment/Plan: #1 malignant hypertension Questionable etiology. On admission patient's systolic blood pressures worse in the 230s. Blood pressure currently systolic in the 505. Patient denies any chest pain, no shortness of breath, no abdominal pain. Patient does state that had some chronic headaches have been ongoing for the past few months. Patient states systolic blood pressure usually in the 170s to the 180s. Continue hydralazine. Increased labetalol to 300 mg by mouth 3 times a day. Lasix has been added per nephrology. If no improvement will start low-dose Norvasc at 5 mg daily and titrate. Follow.  #2 acute on chronic kidney disease Unknown etiology. May be secondary to progressive chronic kidney disease. Urinalysis with greater than 300 proteinuria. Check a urine sodium. Check a urine creatinine. Renal ultrasound pending. Lasix has been started per nephrology. Nephrology following and appreciate input and recommendations.  #3 status post heart transplant Continue immunosuppressants. Check a 2-D echo. Continue aspirin, Zocor, labetalol, Lasix. Cardiology has been consulted.  #4 hypothyroidism TSH pending. Continue home dose Synthroid.  #5 hyperlipidemia Check a fasting lipid panel. Continue statin.  #6 type 2 diabetes Hemoglobin A1c pending. Oral hypoglycemic agents on hold. Continue sliding scale.  #7 CMV status positive  #8 history of intermittent diarrhea Continue probiotic every other day.  #9 anemia Likely anemia of chronic disease. Check an anemia panel. Follow H&H.  #10 prophylaxis Heparin for DVT prophylaxis.    Code Status: Partial: Family Communication: Updated patient no family at bedside. Disposition Plan: Remaining step down unit   Consultants:  Nephrology: Dr Melvia Heaps 18 2015  Cardiology pending  Procedures:  Renal  ultrasound 06/21/2014    Antibiotics:  None  HPI/Subjective: Patient sitting up in bed. No chest pain. No shortness of breath. Patient does complain of some headaches. No other associated symptoms.  Objective: Filed Vitals:   06/21/14 0809  BP: 217/80  Pulse:   Temp:   Resp:     Intake/Output Summary (Last 24 hours) at 06/21/14 0902 Last data filed at 06/21/14 0600  Gross per 24 hour  Intake 626.67 ml  Output    475 ml  Net 151.67 ml   Filed Weights   06/20/14 2127  Weight: 72.5 kg (159 lb 13.3 oz)    Exam:   General:  NAD  Cardiovascular: RRR  Respiratory: CTAB  Abdomen: Soft, nontender, nondistended, positive bowel sounds  Musculoskeletal:  No clubbing cyanosis or edema  Data Reviewed: Basic Metabolic Panel:  Recent Labs Lab 06/20/14 1739 06/21/14 0355  NA 139 141  K 4.6 4.2  CL 103 106  CO2 22 23  GLUCOSE 159* 156*  BUN 60* 57*  CREATININE 4.76* 4.91*  CALCIUM 8.4 7.9*   Liver Function Tests: No results found for this basename: AST, ALT, ALKPHOS, BILITOT, PROT, ALBUMIN,  in the last 168 hours No results found for this basename: LIPASE, AMYLASE,  in the last 168 hours No results found for this basename: AMMONIA,  in the last 168 hours CBC:  Recent Labs Lab 06/20/14 1739  WBC 6.7  HGB 10.3*  HCT 33.4*  MCV 96.5  PLT 183   Cardiac Enzymes:  Recent Labs Lab 06/20/14 1739  TROPONINI <0.30   BNP (last 3 results) No results found for this basename: PROBNP,  in the last 8760 hours CBG:  Recent Labs Lab 06/20/14 2227  GLUCAP 113*    Recent Results (from the past 240 hour(s))  MRSA  PCR SCREENING     Status: None   Collection Time    06/20/14  9:55 PM      Result Value Ref Range Status   MRSA by PCR NEGATIVE  NEGATIVE Final   Comment:            The GeneXpert MRSA Assay (FDA     approved for NASAL specimens     only), is one component of a     comprehensive MRSA colonization     surveillance program. It is not      intended to diagnose MRSA     infection nor to guide or     monitor treatment for     MRSA infections.     Studies: No results found.  Scheduled Meds: . aspirin EC  325 mg Oral QHS  . cholecalciferol  1,000 Units Oral Q breakfast  . cycloSPORINE modified  50 mg Oral BID  . ferrous sulfate  325 mg Oral TID WC  . FLUoxetine  20 mg Oral Q breakfast  . heparin  5,000 Units Subcutaneous 3 times per day  . hydrALAZINE  100 mg Oral TID  . insulin aspart  0-9 Units Subcutaneous TID WC  . labetalol  300 mg Oral TID  . levothyroxine  75 mcg Oral QAC breakfast  . mycophenolate  250 mg Oral QHS  . mycophenolate  500 mg Oral Daily  . neomycin-bacitracin-polymyxin   Topical Daily  . pantoprazole  40 mg Oral Daily  . saccharomyces boulardii  250 mg Oral QODAY  . simvastatin  5 mg Oral q1800  . vitamin B-12  1,000 mcg Oral Q breakfast   Continuous Infusions: . sodium chloride 50 mL/hr at 06/20/14 2216    Principal Problem:   Malignant hypertension Active Problems:   Acute on chronic renal failure   Hypertensive chronic kidney disease   Type II or unspecified type diabetes mellitus with unspecified complication, uncontrolled   Heart transplanted   Unspecified hypothyroidism   Hypercholesterolemia   CMV (cytomegalovirus infection) status positive    Time spent: 76 mins    [facility] MD [facility] Pager [phone]. If 7PM-7AM, please contact night-coverage at [email or web address], password [facility] 06/21/2014, 9:02 AM  LOS: 1 day

## 2014-06-21 NOTE — Progress Notes (Signed)
PT Cancellation Note  Patient Details Name: [name] [name] MRN: [id number] DOB: March 09, 1941   Cancelled Treatment:    Reason Eval/Treat Not Completed: Medical issues which prohibited therapy (Increased BP)   Claretha Cooper 06/21/2014, 1:32 PM

## 2014-06-22 DIAGNOSIS — I10 Essential (primary) hypertension: Secondary | ICD-10-CM

## 2014-06-22 DIAGNOSIS — N183 Chronic kidney disease, stage 3 unspecified: Secondary | ICD-10-CM

## 2014-06-22 LAB — SODIUM, URINE, RANDOM: SODIUM UR: 81 meq/L

## 2014-06-22 LAB — CBC
HEMATOCRIT: 27.5 % — AB (ref 39.0–52.0)
HEMOGLOBIN: 8.8 g/dL — AB (ref 13.0–17.0)
MCH: 30.1 pg (ref 26.0–34.0)
MCHC: 32 g/dL (ref 30.0–36.0)
MCV: 94.2 fL (ref 78.0–100.0)
Platelets: 178 10*3/uL (ref 150–400)
RBC: 2.92 MIL/uL — ABNORMAL LOW (ref 4.22–5.81)
RDW: 14.6 % (ref 11.5–15.5)
WBC: 6.5 10*3/uL (ref 4.0–10.5)

## 2014-06-22 LAB — LIPID PANEL
Cholesterol: 139 mg/dL (ref 0–200)
HDL: 29 mg/dL — AB (ref >39)
LDL CALC: 74 mg/dL (ref 0–99)
Total CHOL/HDL Ratio: 4.8 RATIO
Triglycerides: 181 mg/dL — ABNORMAL HIGH (ref <150)
VLDL: 36 mg/dL (ref 0–40)

## 2014-06-22 LAB — RENAL FUNCTION PANEL
ALBUMIN: 2.5 g/dL — AB (ref 3.5–5.2)
ANION GAP: 14 (ref 5–15)
BUN: 57 mg/dL — ABNORMAL HIGH (ref 6–23)
CHLORIDE: 103 meq/L (ref 96–112)
CO2: 21 meq/L (ref 19–32)
CREATININE: 5.09 mg/dL — AB (ref 0.50–1.35)
Calcium: 8 mg/dL — ABNORMAL LOW (ref 8.4–10.5)
GFR calc Af Amer: 12 mL/min — ABNORMAL LOW (ref >90)
GFR calc non Af Amer: 10 mL/min — ABNORMAL LOW (ref >90)
GLUCOSE: 102 mg/dL — AB (ref 70–99)
POTASSIUM: 4.2 meq/L (ref 3.7–5.3)
Phosphorus: 4.6 mg/dL (ref 2.3–4.6)
Sodium: 138 mEq/L (ref 137–147)

## 2014-06-22 LAB — GLUCOSE, CAPILLARY
GLUCOSE-CAPILLARY: 156 mg/dL — AB (ref 70–99)
Glucose-Capillary: 93 mg/dL (ref 70–99)
Glucose-Capillary: 93 mg/dL (ref 70–99)
Glucose-Capillary: 146 mg/dL — ABNORMAL HIGH (ref 70–99)

## 2014-06-22 LAB — CREATININE, URINE, RANDOM: CREATININE, URINE: 64.8 mg/dL

## 2014-06-22 MED ORDER — LABETALOL HCL 200 MG PO TABS
400.0000 mg | ORAL_TABLET | Freq: Three times a day (TID) | ORAL | Status: DC
  Administered 2014-06-22 – 2014-06-25 (×10): 400 mg via ORAL
  Filled 2014-06-22 (×12): qty 2

## 2014-06-22 MED ORDER — ONDANSETRON HCL 4 MG/2ML IJ SOLN
4.0000 mg | Freq: Four times a day (QID) | INTRAMUSCULAR | Status: DC | PRN
  Administered 2014-06-22: 4 mg via INTRAVENOUS
  Filled 2014-06-22: qty 2

## 2014-06-22 NOTE — Progress Notes (Signed)
*  PRELIMINARY RESULTS* Vascular Ultrasound Renal Artery Duplex has been completed. Study was technically difficult due to overlying bowel gas, patient's medical history, and current status. Blood pressure at the time of exam 250/91.  There is no obvious evidence of hemodynamically significant renal artery stenosis greater than 60%. The left intrarenal arteries exhibit elevated resistive indices, suggestive of intrarenal disease. 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 Although the aorta is within normal limits, limited evaluation reveals an area of dilation in the mid segment, however due to overlying bowel gas it was not thoroughly visualized.   06/22/2014 10:31 AM Maudry Mayhew, RVT, RDCS, RDMS

## 2014-06-22 NOTE — Progress Notes (Signed)
SUBJECTIVE:  He feels well.  No pain.  No SOB   PHYSICAL EXAM Filed Vitals:   06/22/14 0200 06/22/14 0320 06/22/14 0323 06/22/14 0400  BP: 198/68 163/78 126/59   Pulse: 83 82 86   Temp:    98.5 F (36.9 C)  TempSrc:    Oral  Resp: 22 18 15    Height:      Weight:    160 lb 4.4 oz (72.7 kg)  SpO2: 97% 99% 97%    General:  No distress Lungs:  Clear Heart:  RRR Abdomen:  Positive bowel sounds, no rebound no guarding Extremities:  No edema Neck:  No JVD  LABS: Lab Results  Component Value Date   TROPONINI <0.30 06/20/2014   Results for orders placed during the hospital encounter of 06/20/14 (from the past 24 hour(s))  GLUCOSE, CAPILLARY     Status: None   Collection Time    06/21/14  8:32 AM      Result Value Ref Range   Glucose-Capillary 80  70 - 99 mg/dL   Comment 1 Documented in Chart     Comment 2 Notify RN    VITAMIN B12     Status: None   Collection Time    06/21/14  9:37 AM      Result Value Ref Range   Vitamin B-12 573  211 - 911 pg/mL  FOLATE     Status: None   Collection Time    06/21/14  9:37 AM      Result Value Ref Range   Folate 4.3    IRON AND TIBC     Status: Abnormal   Collection Time    06/21/14  9:37 AM      Result Value Ref Range   Iron 59  42 - 135 ug/dL   TIBC 179 (*) 215 - 435 ug/dL   Saturation Ratios 33  20 - 55 %   UIBC 120 (*) 125 - 400 ug/dL  FERRITIN     Status: Abnormal   Collection Time    06/21/14  9:37 AM      Result Value Ref Range   Ferritin 419 (*) 22 - 322 ng/mL  GLUCOSE, CAPILLARY     Status: Abnormal   Collection Time    06/21/14  1:04 PM      Result Value Ref Range   Glucose-Capillary 144 (*) 70 - 99 mg/dL  GLUCOSE, CAPILLARY     Status: Abnormal   Collection Time    06/21/14  4:05 PM      Result Value Ref Range   Glucose-Capillary 119 (*) 70 - 99 mg/dL  SODIUM, URINE, RANDOM     Status: None   Collection Time    06/21/14 10:40 PM      Result Value Ref Range   Sodium, Ur 81    CREATININE, URINE, RANDOM      Status: None   Collection Time    06/21/14 10:40 PM      Result Value Ref Range   Creatinine, Urine 64.8    GLUCOSE, CAPILLARY     Status: None   Collection Time    06/22/14 12:13 AM      Result Value Ref Range   Glucose-Capillary 93  70 - 99 mg/dL  CBC     Status: Abnormal   Collection Time    06/22/14  3:40 AM      Result Value Ref Range   WBC 6.5  4.0 - 10.5 K/uL  RBC 2.92 (*) 4.22 - 5.81 MIL/uL   Hemoglobin 8.8 (*) 13.0 - 17.0 g/dL   HCT 27.5 (*) 39.0 - 52.0 %   MCV 94.2  78.0 - 100.0 fL   MCH 30.1  26.0 - 34.0 pg   MCHC 32.0  30.0 - 36.0 g/dL   RDW 14.6  11.5 - 15.5 %   Platelets 178  150 - 400 K/uL  RENAL FUNCTION PANEL     Status: Abnormal   Collection Time    06/22/14  3:40 AM      Result Value Ref Range   Sodium 138  137 - 147 mEq/L   Potassium 4.2  3.7 - 5.3 mEq/L   Chloride 103  96 - 112 mEq/L   CO2 21  19 - 32 mEq/L   Glucose, Bld 102 (*) 70 - 99 mg/dL   BUN 57 (*) 6 - 23 mg/dL   Creatinine, Ser 5.09 (*) 0.50 - 1.35 mg/dL   Calcium 8.0 (*) 8.4 - 10.5 mg/dL   Phosphorus 4.6  2.3 - 4.6 mg/dL   Albumin 2.5 (*) 3.5 - 5.2 g/dL   GFR calc non Af Amer 10 (*) >90 mL/min   GFR calc Af Amer 12 (*) >90 mL/min   Anion gap 14  5 - 15    Intake/Output Summary (Last 24 hours) at 06/22/14 0729 Last data filed at 06/22/14 0600  Gross per 24 hour  Intake    820 ml  Output   1300 ml  Net   -480 ml     ASSESSMENT AND PLAN:  HTN:   BP has fluctuated.  It is back up this AM.  HR will allow beta blocker titration.    CARDIAC TRANSPLANT:  Echo with normal EF.   No change in therapy.   The patient will be followed in the CHF clinic.   CKD:  Creat up slightly.  Nephrology following.  Renal ultrasound with no obstruction.     Jeneen Rinks [facility] 06/22/2014 7:29 AM

## 2014-06-22 NOTE — Consult Note (Signed)
Patient name: [name] [name] MRN: [id number] DOB: Feb 13, 1941 Sex: male   Referred by: Lorrene Reid  Reason for referral:  Chief Complaint  Patient presents with  . Abnormal Lab  . Hypertension    HISTORY OF PRESENT ILLNESS: Patient is a very pleasant 73 year old gentleman who just moved [place] from [place]. He has a long history of cardiac disease having undergone heart transplant in 2001. He has a long history of renal insufficiency and [place] and was told he was having worsening renal function. We are being consulted for discussion of hemodialysis access. He is making urine. He has no history of prior hemodialysis.  Past Medical History  Diagnosis Date  . CKD III-IV     a. since transplant in 2001 with significant worsening since 06/2013.  Marland Kitchen Hypertension     a. Long h/o HTN, came off of all meds following transplant/wt loss-->meds resumed 06/2013, difficult to control since.  . Diabetes mellitus without complication   . History of stroke   . Hypercalcemia     a. 06/2013 Hospitalized in [place].  Marland Kitchen Heart transplanted     a. 1995 CABG x 4 in [place], [place];  b. 2001 Developed CHF;  c. 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 10/2000 Cardiac transplant @ [facility], [place];  e. Historically nl Bx and stress testing.  Last stress test ~ 5 yrs ago.  Marland Kitchen Hernia of abdominal wall   . Hyperlipidemia   . Orthostatic hypotension     a. Has tried support hose and compression stockings - prefers not to wear.    Past Surgical History  Procedure Laterality Date  . Cardiac surgery    . Heart transplant    . Cataract extraction    . Vasectomy    . Skin graft full thickness leg      cat scratch that did not heal    History   Social History  . Marital Status: Married    Spouse Name: N/A    Number of Children: N/A  . Years of Education: N/A   Occupational History  . Education officer, community    Social History Main Topics  . Smoking status: Never Smoker   . Smokeless tobacco: Not on file    . Alcohol Use: No  . Drug Use: No  . Sexual Activity: Not on file   Other Topics Concern  . Not on file   Social History Narrative   Patient is from [place].  Moved to [place] in late '90's.  Moved to [place] 06/20/2014 to live closer to son.  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  Plans to continue to work now that he is in [place].    Family History  Problem Relation Age of Onset  . Heart attack Father     died @ 78  . Heart attack Mother     Allergies as of 06/20/2014 - Review Complete 06/20/2014  Allergen Reaction Noted  . Norvasc [amlodipine besylate] Swelling 06/20/2014    No current facility-administered medications on file prior to encounter.   No current outpatient prescriptions on file prior to encounter.     REVIEW OF SYSTEMS:  Reviewed in his history and physical with nothing to add  PHYSICAL EXAMINATION:  General: The patient is a well-nourished male, in no acute distress. Vital signs are BP 202/85  Pulse 76  Temp(Src) 97.5 F (36.4 C) (Oral)  Resp 23  Ht 6\' 2"  (1.88 m)  Wt 160 lb 4.4 oz (  72.7 kg)  BMI 20.57 kg/m2  SpO2 100% Pulmonary: There is a good air exchange  Musculoskeletal: There are no major deformities.  There is no significant extremity pain. Neurologic: No focal weakness or paresthesias are detected, Skin: There are no ulcer or rashes noted. Psychiatric: The patient has normal affect. Cardiovascular: 2+ radial pulses bilaterally Surface veins are relatively small. Does have a patent cephalic vein at the wrist with an IV site had been discontinued just above the wrist. Very small antecubital vein.   Vascular Lab Studies:  Upper extremity vein map is pending  Impression and Plan:  Long discussion with the patient. I explained options for hemodialysis to include hemodialysis catheter, AV graft and AV fistula. [place] would be to have functioning long-term access prior to initiation of hemodialysis. His vein map is  pending. I explained that if vein for equal right-to-left we would recommend his nondominant left arm as his initial access. I explained preference for fistula placement with the potential non-maturation. Also explained the surgery is not done [facility] and that he would need to have surgery at [facility].  Will review his vein map for suggestions regarding initial access. Deferred to nephrology regarding timing. If he needs surgery during this admission [facility].    Shalisha Clausing [facility] [place] Office: [phone]

## 2014-06-22 NOTE — Evaluation (Signed)
Physical Therapy Evaluation Patient Details Name: [name] [name] MRN: [id number] DOB: 1941/03/15 Today's Date: 06/22/2014   History of Present Illness  [name] [name] is an 73 y.o. male  with known history of heart transplant in 2001.  PMH is signficant for hypertension, stroke, and diabetes. He is admitted for BP in 200s.   Clinical Impression  Pt admitted with malignant hypertension. Pt currently with functional limitations due to the deficits listed below (see PT Problem List).  Pt will benefit from skilled PT to increase their independence and safety with mobility to allow discharge to the venue listed below.  RN reports MD states okay to see pt today despite hypertension at rest and orthostatic hypotension upon standing.  Pt ambulated in hallway and reports no symptoms.  Pt reports increased falls since April and PT was recommended however states "they" wanted to wait for BP to be under control.  Pt would most benefit from outpatient f/u for strengthening and balance.     Follow Up Recommendations Outpatient PT    Equipment Recommendations  None recommended by PT    Recommendations for Other Services       Precautions / Restrictions Precautions Precautions: Fall Precaution Comments: monitor BP Restrictions Weight Bearing Restrictions: No      Mobility  Bed Mobility Overal bed mobility: Modified Independent                Transfers Overall transfer level: Needs assistance Equipment used: Rolling walker (2 wheeled) Transfers: Sit to/from Stand Sit to Stand: Min guard;From elevated surface         General transfer comment: verbal cues for safe technique  Ambulation/Gait Ambulation/Gait assistance: Min guard Ambulation Distance (Feet): 200 Feet Assistive device: Rolling walker (2 wheeled) Gait Pattern/deviations: Step-through pattern;Trunk flexed     General Gait Details: verbal cues for use of RW initially, LLD R >L and pt reports typically wears shoe  with lift  Stairs            Wheelchair Mobility    Modified Rankin (Stroke Patients Only)       Balance Overall balance assessment: History of Falls (reports multiple falls in April and May of this year)                                           Pertinent Vitals/Pain BP supine 214/82 mmHg 78 bpm Standing 152/73 mmHg 86 bpm End of session 192/93 mmHg 80 bpm    Home Living Family/patient expects to be discharged to:: Private residence Living Arrangements: Spouse/significant other Available Help at Discharge: Family Type of Home: Apartment Home Access: Level entry     Home Layout: One level Home Equipment: Cane - single point;Shower seat;Walker - 2 wheels Additional Comments: lives in apt beside son, just moved here from [facility]    Prior Function Level of Independence: Independent with assistive device(s)         Comments: used cane sometimes for outside     Hand Dominance        Extremity/Trunk Assessment   Upper Extremity Assessment: Overall WFL for tasks assessed           Lower Extremity Assessment: Generalized weakness         Communication   Communication: No difficulties  Cognition Arousal/Alertness: Awake/alert Behavior During Therapy: WFL for tasks assessed/performed Overall Cognitive Status: Within Functional Limits for tasks assessed  General Comments      Exercises        Assessment/Plan    PT Assessment Patient needs continued PT services  PT Diagnosis Difficulty walking;Generalized weakness   PT Problem List Decreased strength;Decreased balance;Decreased mobility  PT Treatment Interventions DME instruction;Gait training;Therapeutic activities;Functional mobility training;Therapeutic exercise;Patient/family education;Balance training   PT Goals (Current goals can be found in the Care Plan section) Acute Rehab PT Goals PT Goal Formulation: With patient/family Time For Goal  Achievement: 06/29/14 Potential to Achieve Goals: Good    Frequency Min 3X/week   Barriers to discharge        Co-evaluation               End of Session   Activity Tolerance: Patient tolerated treatment well Patient left: in bed;with call bell/phone within reach;with family/visitor present Nurse Communication: Mobility status         Time: 0601-5615 PT Time Calculation (min): 17 min   Charges:   PT Evaluation $Initial PT Evaluation Tier I: 1 Procedure PT Treatments $Gait Training: 8-22 mins   PT G Codes:          [name] [name],[name] [name] 06/22/2014, 3:44 PM [name] [name], PT, DPT 06/22/2014 Pager: [phone]

## 2014-06-22 NOTE — Evaluation (Signed)
Occupational Therapy Evaluation Patient Details Name: [name] [name] MRN: [id number] DOB: [birth year] Today's Date: 06/22/2014    History of Present Illness [name] [name] is an 73 y.o. male  with known history of heart transplant in 2001.  PMH is signficant for hypertension, stroke, and diabetes. He is admitted for BP in 200s.    Clinical Impression   Pt will benefit from skilled OT to progress ADL independence for d/c home with wife.    Follow Up Recommendations  No OT follow up;Supervision/Assistance - 24 hour    Equipment Recommendations  None recommended by OT    Recommendations for Other Services       Precautions / Restrictions Precautions Precautions: Fall Precaution Comments: monitor BP Restrictions Weight Bearing Restrictions: No      Mobility Bed Mobility Overal bed mobility: Modified Independent                Transfers Overall transfer level: Needs assistance Equipment used: None Transfers: Sit to/from Stand Sit to Stand: Min guard         General transfer comment: verbal cues for safe technique    Balance                                           ADL Overall ADL's : Needs assistance/impaired Eating/Feeding: Independent;Sitting   Grooming: Wash/dry face;Set up;Sitting   Upper Body Bathing: Set up;Sitting   Lower Body Bathing: Sit to/from stand;Min guard   Upper Body Dressing : Set up;Sitting   Lower Body Dressing: Sit to/from stand;Min guard   Toilet Transfer: Min guard;Stand-pivot   Toileting- Clothing Manipulation and Hygiene: Min guard;Sit to/from stand         General ADL Comments: Wife present for session. Pt able to cross LEs up to don socks. Pivot around to sit up in the chair. Pt has a shower seat and states he sometimes uses it.     Vision                     Perception     Praxis      Pertinent Vitals/Pain BP supine 218/82, sitting 179/100, standing 135/70. HR 75 at rest, 99% O2  at rest.     Hand Dominance     Extremity/Trunk Assessment Upper Extremity Assessment Upper Extremity Assessment: Overall WFL for tasks assessed          Communication Communication Communication: No difficulties   Cognition Arousal/Alertness: Awake/alert Behavior During Therapy: WFL for tasks assessed/performed Overall Cognitive Status: Within Functional Limits for tasks assessed                     General Comments       Exercises       Shoulder Instructions      Home Living Family/patient expects to be discharged to:: Private residence Living Arrangements: Spouse/significant other Available Help at Discharge: Family Type of Home: Apartment Home Access: Level entry     Home Layout: One level     Bathroom Shower/Tub: Occupational psychologist: Standard     Home Equipment: Cane - single point;Shower seat;Walker - 2 wheels   Additional Comments: lives in apt beside son, just moved here from [facility]      Prior Functioning/Environment Level of Independence: Independent with assistive device(s)        Comments: used cane sometimes for outside  OT Diagnosis: Generalized weakness   OT Problem List: Decreased strength;Decreased knowledge of use of DME or AE   OT Treatment/Interventions: Self-care/ADL training;Patient/family education;Therapeutic activities;DME and/or AE instruction    OT Goals(Current goals can be found in the care plan section) Acute Rehab OT Goals Patient Stated Goal: agreeable up to chair OT Goal Formulation: With patient/family Time For Goal Achievement: 07/06/14 Potential to Achieve Goals: Good  OT Frequency: Min 2X/week   Barriers to D/C:            Co-evaluation              End of Session    Activity Tolerance: Patient tolerated treatment well Patient left: in chair;with call bell/phone within reach;with family/visitor present   Time: 1510-1529 OT Time Calculation (min): 19 min Charges:  OT  General Charges $OT Visit: 1 Procedure OT Evaluation $Initial OT Evaluation Tier I: 1 Procedure OT Treatments $Therapeutic Activity: 8-22 mins G-Codes:    Jules Schick [phone] 06/22/2014, 3:49 PM

## 2014-06-22 NOTE — Progress Notes (Signed)
[facility] PROGRESS NOTE  [name] [name] GQQ:[id number] DOB: 09/24/1941 DOA: 06/20/2014 PCP: No primary provider on file.  Assessment/Plan: #1 malignant hypertension Questionable etiology. On admission patient's systolic blood pressures worse in the 230s. Blood pressure currently systolic in the 671-245Y and fluctuated over past 24 hours. BP elevated this morning. Patient denies any chest pain, no shortness of breath, no abdominal pain. Patient does state that had some chronic headaches have been ongoing for the past few months. Patient states systolic blood pressure usually in the 170s to the 180s. Renal Duplex pending. Continue hydralazine and lasix.  Labetalol dose has been increased to 400 mg by mouth 3 times a day per cardiology.Patient with history of orthostasis, mnonitor closely.  Follow.  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 May be secondary to progressive chronic kidney disease. Cr at 5.09 fron 4.91 yesterday with no significant change in GFR. Urinalysis with greater than 300 proteinuria. FENa = 4.35%. Renal ultrasound neg for hydronephrosis. pending. Lasix has been started per nephrology. Nephrology following and appreciate input and recommendations.  #3 status post heart transplant Continue immunosuppressants. 2-D echo EF = 55-60% nwma. Continue aspirin, Zocor, labetalol, Lasix. Cardiology FF. Patient to follow up in heart failure clinic as outpatient.  #4 hypothyroidism TSH 2.960.  Continue Synthroid.  #5 hyperlipidemia FLP with LDL 74. Continue statin.  #6 well controlled type 2 diabetes Hemoglobin A1c is 6.3. CBG 93 - 144. Continue sliding scale.  #7 CMV status positive  #8 history of intermittent diarrhea Continue probiotic every other day.  #9 anemia Likely anemia of chronic disease. Hgb 8.8 from 10.3 on admit. No s/sx of bleeding.  #10 prophylaxis Heparin for DVT prophylaxis.    Code Status: Partial: Family Communication:  Updated patient no family at bedside. Disposition Plan: Remaining step down unit   Consultants:  Nephrology: Dr Melvia Heaps 06/21/2014  Cardiology : Dr Wynonia Lawman  06/21/14  Procedures:  Renal ultrasound 06/21/2014  2 d echo 06/21/14  Antibiotics:  None  HPI/Subjective: Patient sitting up in bed. No chest pain. No shortness of breath. Patient with no complaints.  Objective: Filed Vitals:   06/22/14 0800  BP:   Pulse:   Temp: 98.5 F (36.9 C)  Resp:     Intake/Output Summary (Last 24 hours) at 06/22/14 0841 Last data filed at 06/22/14 0755  Gross per 24 hour  Intake    770 ml  Output   1570 ml  Net   -800 ml   Filed Weights   06/20/14 2127 06/22/14 0400  Weight: 72.5 kg (159 lb 13.3 oz) 72.7 kg (160 lb 4.4 oz)    Exam:   General:  NAD  Cardiovascular: RRR  Respiratory: CTAB  Abdomen: Soft, nontender, nondistended, positive bowel sounds  Musculoskeletal:  No clubbing cyanosis or edema  Data Reviewed: Basic Metabolic Panel:  Recent Labs Lab 06/20/14 1739 06/21/14 0355 06/22/14 0340  NA 139 141 138  K 4.6 4.2 4.2  CL 103 106 103  CO2 22 23 21   GLUCOSE 159* 156* 102*  BUN 60* 57* 57*  CREATININE 4.76* 4.91* 5.09*  CALCIUM 8.4 7.9* 8.0*  PHOS  --   --  4.6   Liver Function Tests:  Recent Labs Lab 06/22/14 0340  ALBUMIN 2.5*   No results found for this basename: LIPASE, AMYLASE,  in the last 168 hours No results found for this basename: AMMONIA,  in the last 168 hours CBC:  Recent Labs Lab 06/20/14 1739 06/22/14 0340  WBC 6.7 6.5  HGB 10.3* 8.8*  HCT 33.4* 27.5*  MCV 96.5 94.2  PLT 183 178   Cardiac Enzymes:  Recent Labs Lab 06/20/14 1739  TROPONINI <0.30   BNP (last 3 results) No results found for this basename: PROBNP,  in the last 8760 hours CBG:  Recent Labs Lab 06/21/14 0832 06/21/14 1304 06/21/14 1605 06/22/14 0013 06/22/14 0805  GLUCAP 80 144* 119* 93 93    Recent Results (from the past 240 hour(s))  MRSA PCR  SCREENING     Status: None   Collection Time    06/20/14  9:55 PM      Result Value Ref Range Status   MRSA by PCR NEGATIVE  NEGATIVE Final   Comment:            The GeneXpert MRSA Assay (FDA     approved for NASAL specimens     only), is one component of a     comprehensive MRSA colonization     surveillance program. It is not     intended to diagnose MRSA     infection nor to guide or     monitor treatment for     MRSA infections.     Studies: US Renal  06/21/2014   CLINICAL DATA:  Acute on chronic renal failure  EXAM: RENAL/URINARY TRACT ULTRASOUND COMPLETE  COMPARISON:  None.  FINDINGS: Right Kidney:  Length: 11.0 cm. 8 mm interpolar calculus. No mass or hydronephrosis.  Left Kidney:  Length: 10.0 cm.  No mass or hydronephrosis.  Bladder:  Within normal limits.  IMPRESSION: 8 mm interpolar right renal calculus.  No hydronephrosis.   Electronically Signed   By: Julian Hy M.D.   On: 06/21/2014 09:21    Scheduled Meds: . aspirin EC  325 mg Oral QHS  . cholecalciferol  1,000 Units Oral Q breakfast  . cycloSPORINE modified  50 mg Oral BID  . ferrous sulfate  325 mg Oral TID WC  . FLUoxetine  20 mg Oral Q breakfast  . furosemide  40 mg Oral BID  . heparin  5,000 Units Subcutaneous 3 times per day  . hydrALAZINE  100 mg Oral TID  . insulin aspart  0-9 Units Subcutaneous TID WC  . labetalol  400 mg Oral TID  . levothyroxine  75 mcg Oral QAC breakfast  . mycophenolate  250 mg Oral QHS  . mycophenolate  500 mg Oral Daily  . neomycin-bacitracin-polymyxin   Topical Daily  . pantoprazole  40 mg Oral Daily  . pravastatin  10 mg Oral Daily  . saccharomyces boulardii  250 mg Oral QODAY  . vitamin B-12  1,000 mcg Oral Q breakfast   Continuous Infusions:    Principal Problem:   Malignant hypertension Active Problems:   Acute on chronic renal failure   Hypertensive chronic kidney disease   Type II or unspecified type diabetes mellitus with unspecified complication,  uncontrolled   Hypothyroidism   Hypercholesterolemia   CMV (cytomegalovirus infection) status positive   Heart transplanted   Hypertensive heart disease   CKD III-IV   Orthostatic hypotension    Time spent: 40 mins    [facility] MD [facility] Pager [phone]. If 7PM-7AM, please contact night-coverage at [email or web address], password [facility] 06/22/2014, 8:41 AM  LOS: 2 days

## 2014-06-22 NOTE — Progress Notes (Signed)
[facility] ROUNDING NOTE  BACKGROUND The patient is a 73 y.o. year-old with hx of heart transplant in 2001 also has CKD w baseline creat in 3-4 range. Just moved to this area from [place], got word from his transplant doctor in [place] that his labwork which was done this week showed a new elevation in Cr to 5.1. Patient was in process of moving and didn't have time to see his doctor in [place], so presented to ED 8/1 with this history. He haD been advised that CKD was worsening and that he would probably require dialysis soon. Reported difficult to control hypertension for the past year, with marked orthostasis, with as much as a 40-50 mm drop with standing. Renal artery duplex is negative for RAS.  ASSESSMENT/RECOMMENDATIONS 1 Acute on CKD 4, vs progression of known underlying CKD. Baseline creat mid 3's, 4.9 on admission, 5.09 today. At stage 5 CKD now. Current GFR around 10.  (At higher levels this change does not reflect a huge loss of GFR). He has no uremic signs or symptoms and likely won't require HD acutely. BP control has improved some. He was made aware by his [place] MD's that he would eventually require didalysis but did but did not receive any education on CKD or have any vascular access planning while in [place].  He and wife are agreeable to [facility] evaluation for AVF.  Will order vein mapping, consult VVS. Show dialysis and CKD videos. Check PTH.  2 HTN, Erratic but improving.  Notably orthostatic (786->767 systolic last BP check). Will need to use sitting or standing and not supine BP's for adjustment of meds. 3 Heart transplant 2001 on CyA and Cellcept  4 HOH  5 DM2 on oral meds  6 Anemia Tsat normal. Suspect in large part due to CKD.  Once HTN better controlled, add aranesp.  Subjective:  Got up and walked with PT Martin Majestic well Has sig orthostasis (known prob) Says has prev interfered with his working at [employer] No CP or SOB Aware of his Stage 5 CKD We discussed  access, etc  Objective Vital signs in last 24 hours: Filed Vitals:   06/22/14 0845 06/22/14 1000 06/22/14 1200 06/22/14 1245  BP: 163/88 250/91 228/81 146/77  Pulse: 83 77 88 87  Temp:   97.8 F (36.6 C)   TempSrc:   Oral   Resp: 16 19 16 27   Height:      Weight:      SpO2: 97% 98% 99% 97%   Weight change: 0.2 kg (7.1 oz)  Intake/Output Summary (Last 24 hours) at 06/22/14 1459 Last data filed at 06/22/14 1400  Gross per 24 hour  Intake    240 ml  Output   2120 ml  Net  -1880 ml   Physical Exam:  Blood pressure 146/77, pulse 87, temperature 97.8 F (36.6 C), temperature source Oral, resp. rate 27, height 6\' 2"  (1.88 m), weight 72.7 kg (160 lb 4.4 oz), SpO2 97.00%. Pleasant older adult male, very pleasant  Hearing aids No rash, cyanosis or gangrene  Sclera anicteric, throat clear  No jvd, flat neck veins, no bruits  Chest clear RRR no MRG, large mid sternal scar  Abd is soft, NTND, no ascites or HSM  Trace pretib edema bilat LE's, no ulcer or gangrene  Pulses diminished in the feet  Neuro is fully alert, Ox 3, nonfocal  Labs: Basic Metabolic Panel:  Recent Labs Lab 06/20/14 1739 06/21/14 0355 06/22/14 0340  NA 139 141 138  K  4.6 4.2 4.2  CL 103 106 103  CO2 22 23 21   GLUCOSE 159* 156* 102*  BUN 60* 57* 57*  CREATININE 4.76* 4.91* 5.09*  CALCIUM 8.4 7.9* 8.0*  PHOS  --   --  4.6    Recent Labs Lab 06/22/14 0340  ALBUMIN 2.5*    Recent Labs Lab 06/20/14 1739 06/22/14 0340  WBC 6.7 6.5  HGB 10.3* 8.8*  HCT 33.4* 27.5*  MCV 96.5 94.2  PLT 183 178    Recent Labs Lab 06/20/14 1739  TROPONINI <0.30    Recent Labs Lab 06/21/14 0832 06/21/14 1304 06/21/14 1605 06/22/14 0013 06/22/14 0805  GLUCAP 80 144* 119* 93 93    Recent Labs Lab 06/21/14 0937  IRON 59  TIBC 179*  FERRITIN 419*   Studies/Results: US Renal  06/21/2014   CLINICAL DATA:  Acute on chronic renal failure  EXAM: RENAL/URINARY TRACT ULTRASOUND COMPLETE  COMPARISON:   None.  FINDINGS: Right Kidney:  Length: 11.0 cm. 8 mm interpolar calculus. No mass or hydronephrosis.  Left Kidney:  Length: 10.0 cm.  No mass or hydronephrosis.  Bladder:  Within normal limits.  IMPRESSION: 8 mm interpolar right renal calculus.  No hydronephrosis.   Electronically Signed   By: Julian Hy M.D.   On: 06/21/2014 09:21   Medications:   . aspirin EC  325 mg Oral QHS  . cholecalciferol  1,000 Units Oral Q breakfast  . cycloSPORINE modified  50 mg Oral BID  . ferrous sulfate  325 mg Oral TID WC  . FLUoxetine  20 mg Oral Q breakfast  . furosemide  40 mg Oral BID  . heparin  5,000 Units Subcutaneous 3 times per day  . hydrALAZINE  100 mg Oral TID  . insulin aspart  0-9 Units Subcutaneous TID WC  . labetalol  400 mg Oral TID  . levothyroxine  75 mcg Oral QAC breakfast  . mycophenolate  250 mg Oral QHS  . mycophenolate  500 mg Oral Daily  . neomycin-bacitracin-polymyxin   Topical Daily  . pantoprazole  40 mg Oral Daily  . pravastatin  10 mg Oral Daily  . saccharomyces boulardii  250 mg Oral QODAY  . vitamin B-12  1,000 mcg Oral Q breakfast   Jamal Maes, MD [facility] [phone] pager 06/22/2014, 2:59 PM

## 2014-06-23 DIAGNOSIS — E039 Hypothyroidism, unspecified: Secondary | ICD-10-CM

## 2014-06-23 DIAGNOSIS — N184 Chronic kidney disease, stage 4 (severe): Secondary | ICD-10-CM

## 2014-06-23 DIAGNOSIS — Z01810 Encounter for preprocedural cardiovascular examination: Secondary | ICD-10-CM

## 2014-06-23 LAB — CBC
HEMATOCRIT: 27.2 % — AB (ref 39.0–52.0)
HEMOGLOBIN: 8.8 g/dL — AB (ref 13.0–17.0)
MCH: 30.7 pg (ref 26.0–34.0)
MCHC: 32.4 g/dL (ref 30.0–36.0)
MCV: 94.8 fL (ref 78.0–100.0)
PLATELETS: 172 10*3/uL (ref 150–400)
RBC: 2.87 MIL/uL — ABNORMAL LOW (ref 4.22–5.81)
RDW: 14.6 % (ref 11.5–15.5)
WBC: 5.5 10*3/uL (ref 4.0–10.5)

## 2014-06-23 LAB — RENAL FUNCTION PANEL
Albumin: 2.5 g/dL — ABNORMAL LOW (ref 3.5–5.2)
Anion gap: 13 (ref 5–15)
BUN: 61 mg/dL — AB (ref 6–23)
CALCIUM: 8.1 mg/dL — AB (ref 8.4–10.5)
CHLORIDE: 105 meq/L (ref 96–112)
CO2: 20 meq/L (ref 19–32)
CREATININE: 5.66 mg/dL — AB (ref 0.50–1.35)
GFR calc Af Amer: 10 mL/min — ABNORMAL LOW (ref >90)
GFR calc non Af Amer: 9 mL/min — ABNORMAL LOW (ref >90)
Glucose, Bld: 89 mg/dL (ref 70–99)
Phosphorus: 4.4 mg/dL (ref 2.3–4.6)
Potassium: 4.3 mEq/L (ref 3.7–5.3)
Sodium: 138 mEq/L (ref 137–147)

## 2014-06-23 LAB — CYCLOSPORINE LEVEL: CYCLOSPORINE: 96 ng/mL — AB (ref 140–330)

## 2014-06-23 LAB — GLUCOSE, CAPILLARY
GLUCOSE-CAPILLARY: 113 mg/dL — AB (ref 70–99)
GLUCOSE-CAPILLARY: 127 mg/dL — AB (ref 70–99)
Glucose-Capillary: 75 mg/dL (ref 70–99)
Glucose-Capillary: 96 mg/dL (ref 70–99)
Glucose-Capillary: 141 mg/dL — ABNORMAL HIGH (ref 70–99)

## 2014-06-23 MED ORDER — KIDNEY FAILURE BOOK
Freq: Once | Status: AC
  Administered 2014-06-24: 12:00:00
  Filled 2014-06-23 (×2): qty 1

## 2014-06-23 MED ORDER — DARBEPOETIN ALFA-POLYSORBATE 100 MCG/0.5ML IJ SOLN
100.0000 ug | INTRAMUSCULAR | Status: DC
  Administered 2014-06-23: 100 ug via SUBCUTANEOUS
  Filled 2014-06-23: qty 0.5

## 2014-06-23 MED ORDER — SODIUM BICARBONATE 650 MG PO TABS
650.0000 mg | ORAL_TABLET | Freq: Three times a day (TID) | ORAL | Status: DC
  Administered 2014-06-23 – 2014-06-25 (×6): 650 mg via ORAL
  Filled 2014-06-23 (×8): qty 1

## 2014-06-23 NOTE — Progress Notes (Signed)
SUBJECTIVE:  He feels well.  No pain.  No SOB   PHYSICAL EXAM Filed Vitals:   06/23/14 0400 06/23/14 0455 06/23/14 0800 06/23/14 0849  BP:  219/74  241/77  Pulse:  80  78  Temp: 98.4 F (36.9 C)  98.3 F (36.8 C)   TempSrc: Oral  Oral   Resp:  17    Height:      Weight: 158 lb 15.2 oz (72.1 kg)     SpO2:  98%  100%   General:  No distress Lungs:  Clear Heart:  RRR Abdomen:  Positive bowel sounds, no rebound no guarding Extremities:  No edema Neck:  No JVD  LABS: Lab Results  Component Value Date   TROPONINI <0.30 06/20/2014   Results for orders placed during the hospital encounter of 06/20/14 (from the past 24 hour(s))  GLUCOSE, CAPILLARY     Status: Abnormal   Collection Time    06/22/14 12:10 PM      Result Value Ref Range   Glucose-Capillary 156 (*) 70 - 99 mg/dL  GLUCOSE, CAPILLARY     Status: Abnormal   Collection Time    06/22/14  4:43 PM      Result Value Ref Range   Glucose-Capillary 146 (*) 70 - 99 mg/dL  GLUCOSE, CAPILLARY     Status: None   Collection Time    06/22/14 10:14 PM      Result Value Ref Range   Glucose-Capillary 75  70 - 99 mg/dL  RENAL FUNCTION PANEL     Status: Abnormal   Collection Time    06/23/14  3:44 AM      Result Value Ref Range   Sodium 138  137 - 147 mEq/L   Potassium 4.3  3.7 - 5.3 mEq/L   Chloride 105  96 - 112 mEq/L   CO2 20  19 - 32 mEq/L   Glucose, Bld 89  70 - 99 mg/dL   BUN 61 (*) 6 - 23 mg/dL   Creatinine, Ser 5.66 (*) 0.50 - 1.35 mg/dL   Calcium 8.1 (*) 8.4 - 10.5 mg/dL   Phosphorus 4.4  2.3 - 4.6 mg/dL   Albumin 2.5 (*) 3.5 - 5.2 g/dL   GFR calc non Af Amer 9 (*) >90 mL/min   GFR calc Af Amer 10 (*) >90 mL/min   Anion gap 13  5 - 15  CBC     Status: Abnormal   Collection Time    06/23/14  3:44 AM      Result Value Ref Range   WBC 5.5  4.0 - 10.5 K/uL   RBC 2.87 (*) 4.22 - 5.81 MIL/uL   Hemoglobin 8.8 (*) 13.0 - 17.0 g/dL   HCT 27.2 (*) 39.0 - 52.0 %   MCV 94.8  78.0 - 100.0 fL   MCH 30.7  26.0 -  34.0 pg   MCHC 32.4  30.0 - 36.0 g/dL   RDW 14.6  11.5 - 15.5 %   Platelets 172  150 - 400 K/uL  GLUCOSE, CAPILLARY     Status: None   Collection Time    06/23/14  8:18 AM      Result Value Ref Range   Glucose-Capillary 96  70 - 99 mg/dL   Comment 1 Documented in Chart     Comment 2 Notify RN      Intake/Output Summary (Last 24 hours) at 06/23/14 1102 Last data filed at 06/23/14 0852  Gross per 24 hour  Intake  0 ml  Output   1250 ml  Net  -1250 ml     ASSESSMENT AND PLAN:  HTN:   BP has fluctuated.  Sitting he is in the 712W systolic.  No change to therapy at this point.   I increased the beta blocker yesterday.  I will not suggest a change today.    CARDIAC TRANSPLANT:  Echo with normal EF.   No change in therapy.   The patient will be followed in the Advanced HF clinic.   CKD:  Creat up slightly.  Nephrology following. [facility]VS has been consulted for vascular access for the eventuality of dialysis .     Jeneen Rinks [facility] 06/23/2014 11:02 AM

## 2014-06-23 NOTE — Progress Notes (Signed)
VASCULAR LAB PRELIMINARY  PRELIMINARY  PRELIMINARY  PRELIMINARY  Right  Upper Extremity Vein Map    Cephalic  Segment Diameter Comment  1. Axilla mm Not visualized  2. Mid upper arm mm Not visualized  3. Above AC 1.6 mm Patent compressible  4. In AC 2.1 mm Patent compressible  5. Below AC 2.4 mm Patent compressible  6. Mid forearm 2.5 mm Patent compresible  7. Wrist 2.6 mm Patent compressible   Basilic  Segment Diameter Depth Comment  2. Mid upper arm 6.7 mm 10.8 mm Patent compressible, enters brachial  3. Above AC 5.8 mm 7.7 mm Patent compressible  4. In AC 4.7 mm 6.0 mm Patent compressible Branch which is 6.2 mm with a depth of 6.7 mm  5. Below AC 4.5 mm 10.8 mm Patent compressible  6. Mid forearm 3.1 mm 4.4 mm Patent compressible  7. Wrist 2.0 mm 1.7 mm Patent compressible    Left Upper Extremity Vein Map    Cephalic  Segment Diameter Comment  1. Axilla mm Not visualized  2. Mid upper arm 1.4 mm Patent compressible  3. Above AC mm Not visualized  4. In AC 1.6 mm Patent compressible  5. Below AC mm Not visualized  6. Mid forearm 2.2 mm Patent compressible  7. Wrist 1.8 mm Patent compressible   Basilic  Segment Diameter Depth Comment  2. Mid upper arm 5.3 mm 6.1 mm Patent compressible enters brachial  3. Above AC 6 mm 4.6 mm Patent compressible  4. In AC 4.9 mm 4.7 mm Patent compressible Branch measures 3.3 mm with a depth of 3.2 mm  5. Below AC 4.7 mm 4.6 mm Patent compressible Branch measures 2.7 mm with a depth of 4.3 mm  6. Mid forearm 3.4 mm 1.7 mm Patent compressible  7. Wrist 2.2 mm 1.7 mm Patent compressible    Marykate Heuberger, RVS 06/23/2014, 11:53 AM

## 2014-06-23 NOTE — Progress Notes (Signed)
[facility] ROUNDING NOTE  BACKGROUND The patient is a 73 y.o. year-old with hx of heart transplant in 2001 also has CKD w baseline creat in 3-4 range. 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  Just moved to this area from [place], got word from his transplant doctor in [place] that his labwork which was done this week showed a new elevation in Cr to 5.1. Patient was in process of moving and didn't have time to see his doctor in [place], so presented to ED 8/1 with this history. He had been advised that CKD was worsening and that he would probably require dialysis soon. Reported difficult to control hypertension for the past year, with marked orthostasis, with as much as a 40-50 mm drop with standing. Renal artery duplex is negative for RAS.   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 Baseline creat mid 3's, 4.9 on admission, 5.6 today. At stage 5 CKD now. Current GFR around 10.  (At higher levels this change does not reflect a huge loss of GFR). He has no uremic signs or symptoms and likely won't require HD acutely. BP control has improved some. He was made aware by his [place] MD's that he would eventually require dialysis but did but did not receive any education on CKD or have any vascular access planning while in [place].  Has now been seen by Dr. Donnetta Hutching. He could not place AVF this admission unless pt transferred to [facility] - not sure that is necessary but would like AVF done near future.  PTH is pending.  Adding oral bicarb for slowly declining CO2. To watch educational videos today. CyA level 93 - not high.  HTN, Erratic but improving.  Notably orthostatic. Continue to use sitting or standing and not supine BP's for adjustment of meds.  Heart transplant 2001 on CyA and Cellcept   HOH   DM2 on  oral meds   Anemia Tsat normal. Suspect in large part due to CKD.  Will give dose of Aranesp 100. (was on monthly procrit in [place])  Subjective:  Dr. Donnetta Hutching has seen re access Cards adjusting BP meds. Sig orthostasis Creatinine rising (I suspect impaired autoregulation with BP control playing role currently) Objective Vital signs in last 24 hours: Filed Vitals:   06/23/14 1114 06/23/14 1116 06/23/14 1200 06/23/14 1325  BP: 174/101 109/57  200/78  Pulse: 87 91  80  Temp:   98.2 F (36.8 C)   TempSrc:   Oral   Resp: 15 17  16   Height:      Weight:      SpO2: 99% 99%  99%   Weight change: -0.6 kg (-1 lb 5.2 oz)  Intake/Output Summary (Last 24 hours) at 06/23/14 1557 Last data filed at 06/23/14 1326  Gross per 24 hour  Intake    240 ml  Output   1400 ml  Net  -1160 ml   Physical Exam:  Blood pressure 146/77, pulse 87, temperature 97.8 F (36.6 C), temperature source Oral, resp. rate 27, height 6\' 2"  (1.88 m), weight 72.7 kg (160 lb 4.4 oz), SpO2 97.00%. Pleasant WM Hearing aids No rash, cyanosis or gangrene  Sclera anicteric, throat clear  No jvd, flat neck veins, no bruits  Chest clear RRR no MRG, large mid sternal scar  Abd is soft, NTND, no ascites or HSM  Trace pretib edema bilat LE's,  no ulcer or gangrene  Pulses diminished in the feet  Neuro is fully alert, Ox 3, nonfocal  Labs: Basic Metabolic Panel:  Recent Labs Lab 06/20/14 1739 06/21/14 0355 06/22/14 0340 06/23/14 0344  NA 139 141 138 138  K 4.6 4.2 4.2 4.3  CL 103 106 103 105  CO2 22 23 21 20   GLUCOSE 159* 156* 102* 89  BUN 60* 57* 57* 61*  CREATININE 4.76* 4.91* 5.09* 5.66*  CALCIUM 8.4 7.9* 8.0* 8.1*  PHOS  --   --  4.6 4.4    Recent Labs Lab 06/22/14 0340 06/23/14 0344  ALBUMIN 2.5* 2.5*    Recent Labs Lab 06/20/14 1739 06/22/14 0340 06/23/14 0344  WBC 6.7 6.5 5.5  HGB 10.3* 8.8* 8.8*  HCT 33.4* 27.5* 27.2*  MCV 96.5 94.2 94.8  PLT 183 178 172    Recent Labs Lab  06/20/14 1739  TROPONINI <0.30    Recent Labs Lab 06/22/14 1210 06/22/14 1643 06/22/14 2214 06/23/14 0818 06/23/14 1320  GLUCAP 156* 146* 75 96 141*    Recent Labs Lab 06/21/14 0937  IRON 59  TIBC 179*  FERRITIN 419*   Studies/Results: No results found. Medications:   . aspirin EC  325 mg Oral QHS  . cholecalciferol  1,000 Units Oral Q breakfast  . cycloSPORINE modified  50 mg Oral BID  . ferrous sulfate  325 mg Oral TID WC  . FLUoxetine  20 mg Oral Q breakfast  . furosemide  40 mg Oral BID  . heparin  5,000 Units Subcutaneous 3 times per day  . hydrALAZINE  100 mg Oral TID  . insulin aspart  0-9 Units Subcutaneous TID WC  . labetalol  400 mg Oral TID  . levothyroxine  75 mcg Oral QAC breakfast  . mycophenolate  250 mg Oral QHS  . mycophenolate  500 mg Oral Daily  . neomycin-bacitracin-polymyxin   Topical Daily  . pantoprazole  40 mg Oral Daily  . pravastatin  10 mg Oral Daily  . saccharomyces boulardii  250 mg Oral QODAY  . vitamin B-12  1,000 mcg Oral Q breakfast   Jamal Maes, MD [facility] [phone] pager 06/23/2014, 3:57 PM

## 2014-06-23 NOTE — Progress Notes (Signed)
[facility] PROGRESS NOTE  Marcelle Bebout HWE:[id number] DOB: October 16, 1941 DOA: 06/20/2014 PCP: No primary provider on file.  Assessment/Plan: #1 malignant hypertension Questionable etiology. On admission patient's systolic blood pressures worse in the 230s. Blood pressure currently systolic in the 893Y and fluctuated over past 24 hours. BP elevated this morning. Patient denies any chest pain, no shortness of breath, no abdominal pain. Patient does state that had some chronic headaches have been ongoing for the past few months. Patient states systolic blood pressure usually in the 170s to the 180s. Renal Duplex with no obvious evidence of hemodynamic significant renal artery stenosis greater than 60%. The left intrarenal arteries exhibit elevated resistive indices suggestive of intrarenal disease. 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 Continue hydralazine and lasix.  Labetalol dose has been increased to 400 mg by mouth 3 times a day per cardiology yesterday. Due to patient's history of orthostasis and per nephrology blood pressures likely need to be checked in the sitting position in the standing position only. Cardiology is following and appreciate input and recommendations.  Follow.  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 May be secondary to progressive chronic kidney disease. Cr at 5.66 from 5.09 fron 4.91 yesterday with no significant change in GFR. Urinalysis with greater than 300 proteinuria. FENa = 4.35%. Renal ultrasound neg for hydronephrosis. pending. Lasix has been started per nephrology. Vascular surgery also has been consulted per nephrology for vein mapping. Nephrology following and appreciate input and recommendations.  #3 status post heart transplant Continue immunosuppressants. 2-D echo EF = 55-60% nwma. Continue aspirin, Zocor, labetalol, Lasix. Cardiology FF. Patient to follow up in heart failure  clinic as outpatient.  #4 hypothyroidism TSH 2.960.  Continue Synthroid.  #5 hyperlipidemia FLP with LDL 74. Continue statin.  #6 well controlled type 2 diabetes Hemoglobin A1c is 6.3. CBG 93 - 144. Continue sliding scale.  #7 CMV status positive  #8 history of intermittent diarrhea Continue probiotic every other day.  #9 anemia Likely anemia of chronic disease. Hgb stable and at 8.8 from 8.8 from 10.3 on admit. No s/sx of bleeding.  #10 prophylaxis Heparin for DVT prophylaxis.    Code Status: Partial: Family Communication: Updated patient and wife at bedside. Disposition Plan: Remaining step down unit   Consultants:  Nephrology: Dr Melvia Heaps 06/21/2014  Cardiology : Dr Wynonia Lawman  06/21/14  Vascular surgery: Dr. Donnetta Hutching 06/22/2014  Procedures:  Renal ultrasound 06/21/2014  2 d echo 06/21/14  Renal duplex 06/22/2014  Antibiotics:  None  HPI/Subjective: Patient sitting up in bed. No chest pain. No shortness of breath. Patient with no complaints.  Objective: Filed Vitals:   06/23/14 0849  BP: 241/77  Pulse: 78  Temp:   Resp:     Intake/Output Summary (Last 24 hours) at 06/23/14 0957 Last data filed at 06/23/14 1017  Gross per 24 hour  Intake      0 ml  Output   1550 ml  Net  -1550 ml   Filed Weights   06/20/14 2127 06/22/14 0400 06/23/14 0400  Weight: 72.5 kg (159 lb 13.3 oz) 72.7 kg (160 lb 4.4 oz) 72.1 kg (158 lb 15.2 oz)    Exam:   General:  NAD  Cardiovascular: RRR  Respiratory: CTAB  Abdomen: Soft, nontender, nondistended, positive bowel sounds  Musculoskeletal:  No clubbing cyanosis or edema  Data Reviewed: Basic Metabolic Panel:  Recent Labs Lab 06/20/14 1739 06/21/14 0355 06/22/14 0340 06/23/14 0344  NA 139 141  138 138  K 4.6 4.2 4.2 4.3  CL 103 106 103 105  CO2 22 23 21 20   GLUCOSE 159* 156* 102* 89  BUN 60* 57* 57* 61*  CREATININE 4.76* 4.91* 5.09* 5.66*  CALCIUM 8.4 7.9* 8.0* 8.1*  PHOS  --   --  4.6 4.4   Liver  Function Tests:  Recent Labs Lab 06/22/14 0340 06/23/14 0344  ALBUMIN 2.5* 2.5*   No results found for this basename: LIPASE, AMYLASE,  in the last 168 hours No results found for this basename: AMMONIA,  in the last 168 hours CBC:  Recent Labs Lab 06/20/14 1739 06/22/14 0340 06/23/14 0344  WBC 6.7 6.5 5.5  HGB 10.3* 8.8* 8.8*  HCT 33.4* 27.5* 27.2*  MCV 96.5 94.2 94.8  PLT 183 178 172   Cardiac Enzymes:  Recent Labs Lab 06/20/14 1739  TROPONINI <0.30   BNP (last 3 results) No results found for this basename: PROBNP,  in the last 8760 hours CBG:  Recent Labs Lab 06/22/14 0805 06/22/14 1210 06/22/14 1643 06/22/14 2214 06/23/14 0818  GLUCAP 93 156* 146* 75 96    Recent Results (from the past 240 hour(s))  MRSA PCR SCREENING     Status: None   Collection Time    06/20/14  9:55 PM      Result Value Ref Range Status   MRSA by PCR NEGATIVE  NEGATIVE Final   Comment:            The GeneXpert MRSA Assay (FDA     approved for NASAL specimens     only), is one component of a     comprehensive MRSA colonization     surveillance program. It is not     intended to diagnose MRSA     infection nor to guide or     monitor treatment for     MRSA infections.     Studies: No results found.  Scheduled Meds: . aspirin EC  325 mg Oral QHS  . cholecalciferol  1,000 Units Oral Q breakfast  . cycloSPORINE modified  50 mg Oral BID  . ferrous sulfate  325 mg Oral TID WC  . FLUoxetine  20 mg Oral Q breakfast  . furosemide  40 mg Oral BID  . heparin  5,000 Units Subcutaneous 3 times per day  . hydrALAZINE  100 mg Oral TID  . insulin aspart  0-9 Units Subcutaneous TID WC  . labetalol  400 mg Oral TID  . levothyroxine  75 mcg Oral QAC breakfast  . mycophenolate  250 mg Oral QHS  . mycophenolate  500 mg Oral Daily  . neomycin-bacitracin-polymyxin   Topical Daily  . pantoprazole  40 mg Oral Daily  . pravastatin  10 mg Oral Daily  . saccharomyces boulardii  250 mg Oral  QODAY  . vitamin B-12  1,000 mcg Oral Q breakfast   Continuous Infusions:    Principal Problem:   Malignant hypertension Active Problems:   Acute on chronic renal failure   Hypertensive chronic kidney disease   Type II or unspecified type diabetes mellitus with unspecified complication, uncontrolled   Hypothyroidism   Hypercholesterolemia   CMV (cytomegalovirus infection) status positive   Heart transplanted   Hypertensive heart disease   CKD III-IV   Orthostatic hypotension    Time spent: 40 mins    [facility] MD [facility] Pager [phone]. If 7PM-7AM, please contact night-coverage at [email or web address], password [facility] 06/23/2014, 9:57 AM  LOS: 3 days

## 2014-06-24 LAB — RENAL FUNCTION PANEL
ALBUMIN: 2.5 g/dL — AB (ref 3.5–5.2)
Anion gap: 15 (ref 5–15)
BUN: 62 mg/dL — ABNORMAL HIGH (ref 6–23)
CALCIUM: 8.1 mg/dL — AB (ref 8.4–10.5)
CO2: 21 mEq/L (ref 19–32)
Chloride: 103 mEq/L (ref 96–112)
Creatinine, Ser: 5.86 mg/dL — ABNORMAL HIGH (ref 0.50–1.35)
GFR, EST AFRICAN AMERICAN: 10 mL/min — AB (ref >90)
GFR, EST NON AFRICAN AMERICAN: 9 mL/min — AB (ref >90)
GLUCOSE: 86 mg/dL (ref 70–99)
PHOSPHORUS: 5 mg/dL — AB (ref 2.3–4.6)
POTASSIUM: 4.3 meq/L (ref 3.7–5.3)
Sodium: 139 mEq/L (ref 137–147)

## 2014-06-24 LAB — GLUCOSE, CAPILLARY
GLUCOSE-CAPILLARY: 95 mg/dL (ref 70–99)
Glucose-Capillary: 173 mg/dL — ABNORMAL HIGH (ref 70–99)
Glucose-Capillary: 90 mg/dL (ref 70–99)
Glucose-Capillary: 157 mg/dL — ABNORMAL HIGH (ref 70–99)
Glucose-Capillary: 104 mg/dL — ABNORMAL HIGH (ref 70–99)

## 2014-06-24 LAB — CBC
HCT: 28 % — ABNORMAL LOW (ref 39.0–52.0)
HEMOGLOBIN: 8.9 g/dL — AB (ref 13.0–17.0)
MCH: 30.1 pg (ref 26.0–34.0)
MCHC: 31.8 g/dL (ref 30.0–36.0)
MCV: 94.6 fL (ref 78.0–100.0)
Platelets: 181 10*3/uL (ref 150–400)
RBC: 2.96 MIL/uL — ABNORMAL LOW (ref 4.22–5.81)
RDW: 14.7 % (ref 11.5–15.5)
WBC: 5.1 10*3/uL (ref 4.0–10.5)

## 2014-06-24 LAB — PARATHYROID HORMONE, INTACT (NO CA): PTH: 146.8 pg/mL — ABNORMAL HIGH (ref 14.0–72.0)

## 2014-06-24 MED ORDER — CALCITRIOL 0.25 MCG PO CAPS
0.2500 ug | ORAL_CAPSULE | ORAL | Status: DC
  Administered 2014-06-24: 0.25 ug via ORAL
  Filled 2014-06-24: qty 1

## 2014-06-24 NOTE — Care Management Note (Addendum)
    Page 1 of 1   06/25/2014     4:22:54 PM CARE MANAGEMENT NOTE 06/25/2014  Patient:  Gratz,Seibert   Account Number:  [id number]  Date Initiated:  06/24/2014  Documentation initiated by:  Dessa Phi  Subjective/Objective Assessment:   73 Y/O M ADMITTED W/HTN CRISIS.     Action/Plan:   FROM HOME.   Anticipated DC Date:  06/25/2014   Anticipated DC Plan:  HOME/SELF CARE      DC Planning Services  CM consult  OP Neuro Rehab      Choice offered to / List presented to:             Status of service:  Completed, signed off Medicare Important Message given?   (If response is "NO", the following Medicare IM given date fields will be blank) Date Medicare IM given:   Medicare IM given by:   Date Additional Medicare IM given:   Additional Medicare IM given by:    Discharge Disposition:  HOME/SELF CARE  Per UR Regulation:  Reviewed for med. necessity/level of care/duration of stay  If discussed at [place] of Stay Meetings, dates discussed:   06/25/2014    Comments:  06/25/14 Zayneb Baucum RN,BSN NCM [id number] D/C OTPT THERAPY SENT TO OTPT NEURO REHAB.  06/24/14 Tamberly Pomplun RN,BSN NCM [place].PT-OTPT,OT-N/A.WILL PROVIDE OTPT NEURO REHAB RESOURCE.

## 2014-06-24 NOTE — Progress Notes (Signed)
Patient ID: Beckam Abdulaziz, male   DOB: 03-02-1941, 73 y.o.   MRN: [id number] [facility] PROGRESS NOTE  Altonio Schwertner NOM:[id number] DOB: 07-Jun-1941 DOA: 06/20/2014 PCP: No primary provider on file.  Brief narrative: 73 y.o. male with history of known heart transplant in 2001, HTN, recently moved to [place] from [place]. His cardiologist recently increased his metoprolol from 50mg  to 75 mg BID and hydralazine from 50 to 100 mg TID. His nephrologist has been following his CKD and he is aware his baseline Cr is 3.9. He was referred for an admission due to elevated Cr. In ED, pt noted to have SBP in 200 and Cr 4.76, asymptomatic with no chest pain or shortness of breath, no headaches or visual changes.   Principal Problem:   Malignant hypertension - better controlled - current medical regimen includes: lasix 40 mg PO BID, Hydralazine 100 mg TID, Labetalol 400 mg TID  Active Problems:   Acute on chronic renal failure, stage V - Cr trend since admission: 4.76 --> 4.91 --> 5.09 --> 5.66 --> 5.86 - Dr. Donnetta Hutching consulted for placement of AVF, if needed acutely, pt needs to be transferred to [facility] - repeat BMP in AM   Anemia of chronic disease - secondary to CKD - Hg remains stable at 8 - 9   Type II or unspecified type diabetes mellitus uncontrolled with complication of CKD - currently on SSI  - once discharge, will resume oral Glimepiride    Hypothyroidism - stable, continue synthroid 75 mcg QD   HLD - continue statin    Heart transplanted - on CyA and Cellcept since 2001   Moderate malnutrition - secondary to acute on chronic illnesses outlined above - encouraged PO intake, still poor oral intake   Orthostatic hypotension - use compressive stockings   DVT [prophylaxis: Heparin subQ  Consultants:  Cardiology  Nephrology   Procedures/Studies:  None   Antibiotics:  None   Code Status: Partial  Family Communication: Pt at bedside Disposition Plan: Home when  medically stable, transfer to telemetry bed   HPI/Subjective: No events overnight.   Objective: Filed Vitals:   06/24/14 0300 06/24/14 0400 06/24/14 0500 06/24/14 0600  BP:  217/77    Pulse: 74 74 77 78  Temp:  97.7 F (36.5 C)    TempSrc:  Oral    Resp: 18 17 17 11   Height:      Weight:  72.5 kg (159 lb 13.3 oz)    SpO2: 98% 99% 97% 99%    Intake/Output Summary (Last 24 hours) at 06/24/14 0847 Last data filed at 06/24/14 0700  Gross per 24 hour  Intake    750 ml  Output   2475 ml  Net  -1725 ml    Exam:   General:  Pt is alert, follows commands appropriately, not in acute distress  Cardiovascular: Regular rate and rhythm, no murmurs, no rubs, no gallops  Respiratory: Clear to auscultation bilaterally, no wheezing, no crackles, no rhonchi  Abdomen: Soft, non tender, non distended, bowel sounds present, no guarding  Extremities: trace bilateral LE pitting edema, pulses DP and PT palpable bilaterally  Neuro: Grossly nonfocal  Data Reviewed: Basic Metabolic Panel:  Recent Labs Lab 06/20/14 1739 06/21/14 0355 06/22/14 0340 06/23/14 0344 06/24/14 0310  NA 139 141 138 138 139  K 4.6 4.2 4.2 4.3 4.3  CL 103 106 103 105 103  CO2 22 23 21 20 21   GLUCOSE 159* 156* 102* 89 86  BUN 60* 57* 57* 61* 62*  CREATININE 4.76* 4.91* 5.09* 5.66* 5.86*  CALCIUM 8.4 7.9* 8.0* 8.1* 8.1*  PHOS  --   --  4.6 4.4 5.0*   Liver Function Tests:  Recent Labs Lab 06/22/14 0340 06/23/14 0344 06/24/14 0310  ALBUMIN 2.5* 2.5* 2.5*   CBC:  Recent Labs Lab 06/20/14 1739 06/22/14 0340 06/23/14 0344 06/24/14 0310  WBC 6.7 6.5 5.5 5.1  HGB 10.3* 8.8* 8.8* 8.9*  HCT 33.4* 27.5* 27.2* 28.0*  MCV 96.5 94.2 94.8 94.6  PLT 183 178 172 181   Cardiac Enzymes:  Recent Labs Lab 06/20/14 1739  TROPONINI <0.30   CBG:  Recent Labs Lab 06/22/14 2214 06/23/14 0818 06/23/14 1320 06/23/14 1615 06/23/14 2232  GLUCAP 75 96 141* 113* 127*    Recent Results (from the past  240 hour(s))  MRSA PCR SCREENING     Status: None   Collection Time    06/20/14  9:55 PM      Result Value Ref Range Status   MRSA by PCR NEGATIVE  NEGATIVE Final   Comment:            The GeneXpert MRSA Assay (FDA     approved for NASAL specimens     only), is one component of a     comprehensive MRSA colonization     surveillance program. It is not     intended to diagnose MRSA     infection nor to guide or     monitor treatment for     MRSA infections.     Scheduled Meds: . aspirin EC  325 mg Oral QHS  . cholecalciferol  1,000 Units Oral Q breakfast  . cycloSPORINE modified  50 mg Oral BID  . darbepoetin (ARANESP)   100 mcg Subcutaneous Q Tue-1800  . ferrous sulfate  325 mg Oral TID WC  . FLUoxetine  20 mg Oral Q breakfast  . furosemide  40 mg Oral BID  . heparin  5,000 Units Subcutaneous 3 times per day  . hydrALAZINE  100 mg Oral TID  . insulin aspart  0-9 Units Subcutaneous TID WC  . labetalol  400 mg Oral TID  . levothyroxine  75 mcg Oral QAC breakfast  . mycophenolate  250 mg Oral QHS  . mycophenolate  500 mg Oral Daily  . pantoprazole  40 mg Oral Daily  . pravastatin  10 mg Oral Daily  . saccharomyces boulardii  250 mg Oral QODAY  . sodium bicarbonate  650 mg Oral TID  . vitamin B-12  1,000 mcg Oral Q breakfast   Continuous Infusions:  Faye Ramsay, MD  [facility] Pager [phone]  If 7PM-7AM, please contact night-coverage [email or web address] Password [email or web address] 06/24/2014, 8:47 AM   LOS: 4 days

## 2014-06-24 NOTE — Progress Notes (Signed)
Pt transferred to 1405. Full report given via telephone. VSS for transport.

## 2014-06-24 NOTE — Progress Notes (Signed)
[facility] ROUNDING NOTE  BACKGROUND The patient is a 73 y.o. year-old with hx of heart transplant in 2001 also has CKD w/ baseline creat in 3-4 range. 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  Just moved to this area from [place], got word from his transplant doctor in [place] that his labwork which was done this week showed a new elevation in Cr to 5.1. Patient was in process of moving and didn't have time to see his doctor in [place], so presented to ED 8/1 with this history. He had been advised that CKD was worsening and that he would probably require dialysis soon. Reported difficult to control hypertension for the past year, with marked orthostasis, with as much as a 40-50 mm drop with standing. Renal artery duplex is negative for RAS.   ASSESSMENT/RECOMMENDATIONS  Acute on CKD 4, vs progression of known underlying CKD - now CKD Stage 5. 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 Baseline creat mid 3's, 4.9 on admission, 5.86 today. Current GFR around  9-10.  (At higher levels this change does not reflect a huge loss of GFR). He has no uremic signs or symptoms and likely won't require HD acutely. BP control has improved some. He was made aware by his [place] MD's that he would eventually require dialysis but did but did not receive any education on CKD or have any vascular access planning while in [place].  Has now been seen by Dr. Donnetta Hutching. He could not place AVF this admission unless pt transferred to [facility] - not sure that is necessary but would like AVF done near future. I will contact him  Added oral bicarb for slowly declining CO2.  Saw educational videos yesterday. CyA level 93 - not high. 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 Will start low dose calcitriol  0.25 mcg M,W,F (8/5)  Anemia - on procrit while  living in [place]. Iron stores adequate. S/p Aranesp 100 mcg [place] on 06/23/14. Will arrange for continuation as outpt  HTN, Erratic but improving.  Notably orthostatic. Continue to use sitting or standing and not supine BP's for adjustment of meds. Cards wants elimination of lasix (? Why?)  Heart transplant 2001 on CyA and Cellcept   HOH   DM2 on oral meds    Subjective:  Dr. Donnetta Hutching has seen re access - I will call him about timing - not necessary this admit but will need in near future Cards adjusting BP meds. Sig orthostasis persists (chronic) Creatinine still rising (I suspect impaired autoregulation with BP control playing role currently) but GFR not that much different and no uremic symptoms  Objective Vital signs in last 24 hours: Filed Vitals:   06/24/14 0600 06/24/14 0800 06/24/14 1000 06/24/14 1451  BP:   176/75 159/76  Pulse: 78  77 75  Temp:  97.8 F (36.6 C) 97.6 F (36.4 C) 98.1 F (36.7 C)  TempSrc:  Oral Oral Oral  Resp: 11  14 14   Height:      Weight:      SpO2: 99%  100% 100%   Weight change: 0.4 kg (14.1 oz)  Intake/Output Summary (Last 24 hours) at 06/24/14 1459 Last data filed at 06/24/14 1234  Gross per 24 hour  Intake    750 ml  Output   1875 ml  Net  -1125 ml   Physical Exam:  Blood pressure  146/77, pulse 87, temperature 97.8 F (36.6 C), temperature source Oral, resp. rate 27, height 6\' 2"  (1.88 m), weight 72.7 kg (160 lb 4.4 oz), SpO2 97.00%. Pleasant WM Hearing aids Sclera anicteric No jvd, flat neck veins, no bruits  Chest clear RRR no MRG, large mid sternal scar  Abd is soft, NTND, no ascites or HSM  Trace pretib edema bilat LE's, no ulcer or gangrene  Pulses diminished in the feet  Neuro is fully alert, Ox 3, nonfocal  Labs: Basic Metabolic Panel:  Recent Labs Lab 06/20/14 1739 06/21/14 0355 06/22/14 0340 06/23/14 0344 06/24/14 0310  NA 139 141 138 138 139  K 4.6 4.2 4.2 4.3 4.3  CL 103 106 103 105 103  CO2 22 23 21 20 21    GLUCOSE 159* 156* 102* 89 86  BUN 60* 57* 57* 61* 62*  CREATININE 4.76* 4.91* 5.09* 5.66* 5.86*  CALCIUM 8.4 7.9* 8.0* 8.1* 8.1*  PHOS  --   --  4.6 4.4 5.0*    Recent Labs Lab 06/22/14 0340 06/23/14 0344 06/24/14 0310  ALBUMIN 2.5* 2.5* 2.5*    Recent Labs Lab 06/20/14 1739 06/22/14 0340 06/23/14 0344 06/24/14 0310  WBC 6.7 6.5 5.5 5.1  HGB 10.3* 8.8* 8.8* 8.9*  HCT 33.4* 27.5* 27.2* 28.0*  MCV 96.5 94.2 94.8 94.6  PLT 183 178 172 181    Recent Labs Lab 06/20/14 1739  TROPONINI <0.30    Recent Labs Lab 06/23/14 0818 06/23/14 1320 06/23/14 1615 06/23/14 2232 06/24/14 1234  GLUCAP 96 141* 113* 127* 157*    Recent Labs Lab 06/21/14 0937  IRON 59  TIBC 179*  FERRITIN 419*   Results for [name] [name] (MRN [id number]) as of 06/24/2014 15:00  Ref. Range 06/23/2014 03:44  PTH Latest Range: 14.0-72.0 pg/mL 146.8 (H)   Studies/Results: No results found. Medications:   . aspirin EC  325 mg Oral QHS  . cholecalciferol  1,000 Units Oral Q breakfast  . cycloSPORINE modified  50 mg Oral BID  . darbepoetin (ARANESP) injection - NON-DIALYSIS  100 mcg Subcutaneous Q Tue-1800  . ferrous sulfate  325 mg Oral TID WC  . FLUoxetine  20 mg Oral Q breakfast  . furosemide  40 mg Oral BID  . heparin  5,000 Units Subcutaneous 3 times per day  . hydrALAZINE  100 mg Oral TID  . insulin aspart  0-9 Units Subcutaneous TID WC  . labetalol  400 mg Oral TID  . levothyroxine  75 mcg Oral QAC breakfast  . mycophenolate  250 mg Oral QHS  . mycophenolate  500 mg Oral Daily  . neomycin-bacitracin-polymyxin   Topical Daily  . pantoprazole  40 mg Oral Daily  . pravastatin  10 mg Oral Daily  . saccharomyces boulardii  250 mg Oral QODAY  . sodium bicarbonate  650 mg Oral TID  . vitamin B-12  1,000 mcg Oral Q breakfast   Jamal Maes, MD [facility] [phone] pager 06/24/2014, 2:59 PM

## 2014-06-24 NOTE — Progress Notes (Signed)
SUBJECTIVE:  He feels well.  No pain.  No SOB.  He has not felt orthostatic in the brief time that he stands   PHYSICAL EXAM Filed Vitals:   06/23/14 2245 06/23/14 2251 06/24/14 0000 06/24/14 0400  BP: 167/79 100/53 176/65   Pulse: 73 76 74   Temp:   97.3 F (36.3 C) 97.7 F (36.5 C)  TempSrc:   Oral Oral  Resp: 16  20   Height:      Weight:    159 lb 13.3 oz (72.5 kg)  SpO2: 99%  97%    General:  No distress Lungs:  Clear Heart:  RRR Abdomen:  Positive bowel sounds, no rebound no guarding Extremities:  No edema Neck:  No JVD  LABS: Lab Results  Component Value Date   TROPONINI <0.30 06/20/2014   Results for orders placed during the hospital encounter of 06/20/14 (from the past 24 hour(s))  GLUCOSE, CAPILLARY     Status: None   Collection Time    06/23/14  8:18 AM      Result Value Ref Range   Glucose-Capillary 96  70 - 99 mg/dL   Comment 1 Documented in Chart     Comment 2 Notify RN    GLUCOSE, CAPILLARY     Status: Abnormal   Collection Time    06/23/14  1:20 PM      Result Value Ref Range   Glucose-Capillary 141 (*) 70 - 99 mg/dL  GLUCOSE, CAPILLARY     Status: Abnormal   Collection Time    06/23/14  4:15 PM      Result Value Ref Range   Glucose-Capillary 113 (*) 70 - 99 mg/dL   Comment 1 Documented in Chart     Comment 2 Notify RN    GLUCOSE, CAPILLARY     Status: Abnormal   Collection Time    06/23/14 10:32 PM      Result Value Ref Range   Glucose-Capillary 127 (*) 70 - 99 mg/dL  RENAL FUNCTION PANEL     Status: Abnormal   Collection Time    06/24/14  3:10 AM      Result Value Ref Range   Sodium 139  137 - 147 mEq/L   Potassium 4.3  3.7 - 5.3 mEq/L   Chloride 103  96 - 112 mEq/L   CO2 21  19 - 32 mEq/L   Glucose, Bld 86  70 - 99 mg/dL   BUN 62 (*) 6 - 23 mg/dL   Creatinine, Ser 5.86 (*) 0.50 - 1.35 mg/dL   Calcium 8.1 (*) 8.4 - 10.5 mg/dL   Phosphorus 5.0 (*) 2.3 - 4.6 mg/dL   Albumin 2.5 (*) 3.5 - 5.2 g/dL   GFR calc non Af Amer 9 (*) >90  mL/min   GFR calc Af Amer 10 (*) >90 mL/min   Anion gap 15  5 - 15  CBC     Status: Abnormal   Collection Time    06/24/14  3:10 AM      Result Value Ref Range   WBC 5.1  4.0 - 10.5 K/uL   RBC 2.96 (*) 4.22 - 5.81 MIL/uL   Hemoglobin 8.9 (*) 13.0 - 17.0 g/dL   HCT 28.0 (*) 39.0 - 52.0 %   MCV 94.6  78.0 - 100.0 fL   MCH 30.1  26.0 - 34.0 pg   MCHC 31.8  30.0 - 36.0 g/dL   RDW 14.7  11.5 - 15.5 %  Platelets 181  150 - 400 K/uL    Intake/Output Summary (Last 24 hours) at 06/24/14 2263 Last data filed at 06/24/14 0500  Gross per 24 hour  Intake    720 ml  Output   2175 ml  Net  -1455 ml     ASSESSMENT AND PLAN:  HTN:  Profound orthostasis.  Can use compression stockings (which he has used at home) and consider an abdominal binder for BP control when standing.  Continue current meds.   CARDIAC TRANSPLANT:  Echo with normal EF.   No change in therapy.   The patient will be followed in the Advanced HF clinic.   CKD:  Per nephrology.  I would suggest discontinuing diuretic but will defer to nephrology.   Jeneen Rinks [facility] 06/24/2014 6:32 AM

## 2014-06-25 LAB — RENAL FUNCTION PANEL
ALBUMIN: 2.7 g/dL — AB (ref 3.5–5.2)
Anion gap: 15 (ref 5–15)
BUN: 65 mg/dL — AB (ref 6–23)
CALCIUM: 8.4 mg/dL (ref 8.4–10.5)
CHLORIDE: 101 meq/L (ref 96–112)
CO2: 21 mEq/L (ref 19–32)
CREATININE: 6.39 mg/dL — AB (ref 0.50–1.35)
GFR calc Af Amer: 9 mL/min — ABNORMAL LOW (ref >90)
GFR calc non Af Amer: 8 mL/min — ABNORMAL LOW (ref >90)
Glucose, Bld: 96 mg/dL (ref 70–99)
Phosphorus: 5.2 mg/dL — ABNORMAL HIGH (ref 2.3–4.6)
Potassium: 4.2 mEq/L (ref 3.7–5.3)
Sodium: 137 mEq/L (ref 137–147)

## 2014-06-25 LAB — CBC
HEMATOCRIT: 28.3 % — AB (ref 39.0–52.0)
Hemoglobin: 9 g/dL — ABNORMAL LOW (ref 13.0–17.0)
MCH: 30.1 pg (ref 26.0–34.0)
MCHC: 31.8 g/dL (ref 30.0–36.0)
MCV: 94.6 fL (ref 78.0–100.0)
Platelets: 181 10*3/uL (ref 150–400)
RBC: 2.99 MIL/uL — AB (ref 4.22–5.81)
RDW: 14.7 % (ref 11.5–15.5)
WBC: 5.4 10*3/uL (ref 4.0–10.5)

## 2014-06-25 LAB — GLUCOSE, CAPILLARY
GLUCOSE-CAPILLARY: 85 mg/dL (ref 70–99)
Glucose-Capillary: 119 mg/dL — ABNORMAL HIGH (ref 70–99)

## 2014-06-25 MED ORDER — SACCHAROMYCES BOULARDII 250 MG PO CAPS: 250.0000 mg | ORAL_CAPSULE | ORAL | Status: DC

## 2014-06-25 MED ORDER — CALCITRIOL 0.25 MCG PO CAPS: 0.2500 ug | ORAL_CAPSULE | ORAL | Status: DC

## 2014-06-25 MED ORDER — FUROSEMIDE 40 MG PO TABS: 40.0000 mg | ORAL_TABLET | Freq: Two times a day (BID) | ORAL | Status: DC

## 2014-06-25 MED ORDER — SODIUM BICARBONATE 650 MG PO TABS: 650.0000 mg | ORAL_TABLET | Freq: Three times a day (TID) | ORAL | Status: DC

## 2014-06-25 MED ORDER — LABETALOL HCL 200 MG PO TABS: 400.0000 mg | ORAL_TABLET | Freq: Three times a day (TID) | ORAL | Status: DC

## 2014-06-25 NOTE — Discharge Summary (Signed)
Physician Discharge Summary  Shandon Burlingame XBL:[id number] DOB: 1941-10-26 DOA: 06/20/2014  PCP: No primary provider on file.  Admit date: 06/20/2014 Discharge date: 06/25/2014  Recommendations for Outpatient Follow-up:  1. Pt will need to follow up with PCP in 2-3 weeks post discharge 2. Please obtain BMP to evaluate electrolytes and kidney function 3. Please also check CBC to evaluate Hg and Hct levels 4. Please note that pt has an appointment scheduled with Dr. Lorrene Reid on Monday and he is aware of the time and date  Discharge Diagnoses: Malignant hypertension, with orthostasis and ESRD Principal Problem:   Malignant hypertension Active Problems:   Acute on chronic renal failure   Hypertensive chronic kidney disease   Type II or unspecified type diabetes mellitus with unspecified complication, uncontrolled   Hypothyroidism   Hypercholesterolemia   CMV (cytomegalovirus infection) status positive   Heart transplanted   Hypertensive heart disease   CKD III-IV   Orthostatic hypotension  Discharge Condition: Stable  Diet recommendation: Heart healthy diet discussed in details   Brief narrative:  73 y.o. male with history of known heart transplant in 2001, HTN, recently moved to [place] from [place]. His cardiologist recently increased his metoprolol from 50mg  to 75 mg BID and hydralazine from 50 to 100 mg TID. His nephrologist has been following his CKD and he is aware his baseline Cr is 3.9. He was referred for an admission due to elevated Cr. In ED, pt noted to have SBP in 200 and Cr 4.76, asymptomatic with no chest pain or shortness of breath, no headaches or visual changes.   Principal Problem:  Malignant hypertension  - better controlled  - current medical regimen includes: lasix 40 mg PO BID, Hydralazine 100 mg TID, Labetalol 400 mg TID  Active Problems:  Acute on chronic renal failure, stage V  - Cr trend since admission: 4.76 --> 4.91 --> 5.09 --> 5.66 --> 5.86 -->  6.39 - Dr. Donnetta Hutching consulted for placement of AVF, this can be done in an outpatient setting  Anemia of chronic disease  - secondary to CKD  - Hg remains stable at 8 - 9  Type II or unspecified type diabetes mellitus uncontrolled with complication of CKD  - currently on SSI  - will resume oral Glimepiride upon discharge  Hypothyroidism  - stable, continue synthroid 75 mcg QD  HLD  - continue statin  Heart transplanted  - on CyA and Cellcept since 2001  Moderate malnutrition  - secondary to acute on chronic illnesses outlined above  - encouraged PO intake, still poor oral intake  Orthostatic hypotension  - use compressive stockings   DVT [prophylaxis: Heparin subQ while inpatient   Consultants:  Cardiology  Nephrology  Procedures/Studies:  None  Antibiotics:  None   Code Status: Partial  Family Communication: Pt and wife at bedside  Disposition Plan: Home today    Discharge Exam: Filed Vitals:   06/25/14 0610  BP: 88/52  Pulse:   Temp:   Resp: 18   Filed Vitals:   06/24/14 2159 06/25/14 0603 06/25/14 0608 06/25/14 0610  BP: 189/68 216/85 145/79 88/52  Pulse: 75     Temp: 98.5 F (36.9 C) 97.8 F (36.6 C)    TempSrc: Oral Oral    Resp: 18 18 18 18   Height:      Weight:   71.85 kg (158 lb 6.4 oz)   SpO2: 100% 100% 100% 98%    General: Pt is alert, follows commands appropriately, not in acute distress Cardiovascular: Regular  rate and rhythm, no rubs, no gallops Respiratory: Clear to auscultation bilaterally, no wheezing, no crackles, no rhonchi Abdominal: Soft, non tender, non distended, bowel sounds +, no guarding  Discharge Instructions  Discharge Instructions   Diet - low sodium heart healthy    Complete by:  As directed      Increase activity slowly    Complete by:  As directed             Medication List    STOP taking these medications       amoxicillin 875 MG tablet  Commonly known as:  AMOXIL     cholecalciferol 1000 UNITS tablet   Commonly known as:  VITAMIN D     diphenoxylate-atropine 2.5-0.025 MG per tablet  Commonly known as:  LOMOTIL     magnesium oxide 400 MG tablet  Commonly known as:  MAG-OX     metoprolol 50 MG tablet  Commonly known as:  LOPRESSOR     PROCRIT IJ     tiZANidine 4 MG tablet  Commonly known as:  ZANAFLEX      TAKE these medications       acetaminophen 500 MG tablet  Commonly known as:  TYLENOL  Take 1,000 mg by mouth every 6 (six) hours as needed for mild pain.     aspirin EC 325 MG tablet  Take 325 mg by mouth at bedtime.     calcitRIOL 0.25 MCG capsule  Commonly known as:  ROCALTROL  Take 1 capsule (0.25 mcg total) by mouth every Monday, Wednesday, and Friday.     cycloSPORINE modified 50 MG capsule  Commonly known as:  NEORAL  Take 50 mg by mouth 2 (two) times daily.     ferrous sulfate 325 (65 FE) MG tablet  Take 325 mg by mouth 3 (three) times daily with meals.     FLUoxetine 20 MG capsule  Commonly known as:  PROZAC  Take 20 mg by mouth daily with breakfast.     furosemide 40 MG tablet  Commonly known as:  LASIX  Take 1 tablet (40 mg total) by mouth 2 (two) times daily.     glimepiride 1 MG tablet  Commonly known as:  AMARYL  Take 0.5 mg by mouth daily as needed (for high blood sugar). Takes if blood sugar is over 120 in the morning     hydrALAZINE 100 MG tablet  Commonly known as:  APRESOLINE  Take 100 mg by mouth 3 (three) times daily.     labetalol 200 MG tablet  Commonly known as:  NORMODYNE  Take 2 tablets (400 mg total) by mouth 3 (three) times daily.     lansoprazole 30 MG capsule  Commonly known as:  PREVACID  Take 30 mg by mouth 2 (two) times daily before a meal.     levothyroxine 75 MCG tablet  Commonly known as:  SYNTHROID, LEVOTHROID  Take 75 mcg by mouth daily before breakfast.     mycophenolate 250 MG capsule  Commonly known as:  CELLCEPT  Take 250-500 mg by mouth 2 (two) times daily. Takes 500mg  in the morning and 250mg  in the  evening     pravastatin 10 MG tablet  Commonly known as:  PRAVACHOL  Take 10 mg by mouth daily with breakfast.     saccharomyces boulardii 250 MG capsule  Commonly known as:  FLORASTOR  Take 1 capsule (250 mg total) by mouth every other day.     sodium bicarbonate 650 MG tablet  Take 1 tablet (650 mg total) by mouth 3 (three) times daily.     vitamin B-12 1000 MCG tablet  Commonly known as:  CYANOCOBALAMIN  Take 1,000 mcg by mouth daily with breakfast.           Follow-up Information   Follow up with DUNHAM,CYNTHIA B, MD On 06/29/2014. (Appointment scheduled at 11 am)    Specialty:  Nephrology   Contact information:   [address] [place] [address] [phone]       Follow up with Faye Ramsay, MD. (As needed, call my cell phone [phone])    Specialty:  Internal Medicine   Contact information:   [address] [place] [address] [phone]        The results of significant diagnostics from this hospitalization (including imaging, microbiology, ancillary and laboratory) are listed below for reference.     Microbiology: Recent Results (from the past 240 hour(s))  MRSA PCR SCREENING     Status: None   Collection Time    06/20/14  9:55 PM      Result Value Ref Range Status   MRSA by PCR NEGATIVE  NEGATIVE Final   Comment:            The GeneXpert MRSA Assay (FDA     approved for NASAL specimens     only), is one component of a     comprehensive MRSA colonization     surveillance program. It is not     intended to diagnose MRSA     infection nor to guide or     monitor treatment for     MRSA infections.     Labs: Basic Metabolic Panel:  Recent Labs Lab 06/21/14 0355 06/22/14 0340 06/23/14 0344 06/24/14 0310 06/25/14 0420  NA 141 138 138 139 137  K 4.2 4.2 4.3 4.3 4.2  CL 106 103 105 103 101  CO2 23 21 20 21 21   GLUCOSE 156* 102* 89 86 96  BUN 57* 57* 61* 62* 65*  CREATININE 4.91* 5.09* 5.66* 5.86* 6.39*  CALCIUM 7.9*  8.0* 8.1* 8.1* 8.4  PHOS  --  4.6 4.4 5.0* 5.2*   Liver Function Tests:  Recent Labs Lab 06/22/14 0340 06/23/14 0344 06/24/14 0310 06/25/14 0420  ALBUMIN 2.5* 2.5* 2.5* 2.7*   CBC:  Recent Labs Lab 06/20/14 1739 06/22/14 0340 06/23/14 0344 06/24/14 0310 06/25/14 0420  WBC 6.7 6.5 5.5 5.1 5.4  HGB 10.3* 8.8* 8.8* 8.9* 9.0*  HCT 33.4* 27.5* 27.2* 28.0* 28.3*  MCV 96.5 94.2 94.8 94.6 94.6  PLT 183 178 172 181 181   Cardiac Enzymes:  Recent Labs Lab 06/20/14 1739  TROPONINI <0.30   CBG:  Recent Labs Lab 06/24/14 0752 06/24/14 1234 06/24/14 1708 06/24/14 2203 06/25/14 0734  GLUCAP 90 157* 104* 95 85     SIGNED: Time coordinating discharge: Over 30 minutes  MAGICK-Teletha Petrea, MD  [facility] 06/25/2014, 11:50 AM Pager [phone]  If 7PM-7AM, please contact night-coverage [email or web address] Password [email or web address]

## 2014-06-25 NOTE — Plan of Care (Signed)
Problem: Food- and Nutrition-Related Knowledge Deficit (NB-1.1) Goal: Nutrition education Formal process to instruct or train a patient/client in a skill or to impart knowledge to help patients/clients voluntarily manage or modify food choices and eating behavior to maintain or improve health. Outcome: Completed/Met Date Met:  06/25/14 Nutrition Education Note  RD received verbal consult by RN for Renal Education. Provided Choose-A-Meal Booklet to patient/family. Reviewed food groups and provided written recommended serving sizes specifically determined for patient's current nutritional status.   Explained why diet restrictions are needed and provided lists of foods to limit/avoid that are high potassium, sodium, and phosphorus. Provided specific recommendations on safer alternatives of these foods. Strongly encouraged compliance of this diet.   Discussed importance of protein intake at each meal and snack. Provided examples of how to maximize protein intake throughout the day. Discussed need for fluid restriction with dialysis, importance of minimizing weight gain between HD treatments, and renal-friendly beverage options.  Encouraged pt to discuss specific diet questions/concerns with RD at HD outpatient facility or inpatient renal RD. Teach back method used.  Expect good compliance. Pt and pt's wife asked appropriate questions. [name] noted they eat many meals at restaurants, provided pt and pt's wife with educational materials concerning dining out with CKD. Pt enjoys many high potassium and phosphorus foods. Reviewed leaching and alternative options to maintain nutritional status while complying with recommendations. Had questions regarding with fluid restrictions, provided handout outlining current 1200 ml fluid restriction.   Body mass index is 20.33 kg/(m^2). Pt meets criteria for Normal weight based on current BMI.  Current diet order is Carb Mod/Renal 1200 ml, patient is consuming  approximately 75% of meals at this time. Labs and medications reviewed. No further nutrition interventions warranted at this time. RD contact information provided. If additional nutrition issues arise, please re-consult RD.  Atlee Abide MS RD LDN Clinical Dietitian JHERD:[phone]

## 2014-06-25 NOTE — Discharge Instructions (Signed)
Chronic Kidney Disease °Chronic kidney disease occurs when the kidneys are damaged over a long period. The kidneys are two organs that lie on either side of the spine between the middle of the back and the front of the abdomen. The kidneys:  °· Remove wastes and extra water from the blood.   °· Produce important hormones. These help keep bones strong, regulate blood pressure, and help create red blood cells.   °· Balance the fluids and chemicals in the blood and tissues. °A small amount of kidney damage may not cause problems, but a large amount of damage may make it difficult or impossible for the kidneys to work the way they should. If steps are not taken to slow down the kidney damage or stop it from getting worse, the kidneys may stop working permanently. Most of the time, chronic kidney disease does not go away. However, it can often be controlled, and those with the disease can usually live normal lives. °CAUSES  °The most common causes of chronic kidney disease are diabetes and high blood pressure (hypertension). Chronic kidney disease may also be caused by:  °· Diseases that cause the kidneys' filters to become inflamed.   °· Diseases that affect the immune system.   °· Genetic diseases.   °· Medicines that damage the kidneys, such as anti-inflammatory medicines.   °· Poisoning or exposure to toxic substances.   °· A reoccurring kidney or urinary infection.   °· A problem with urine flow. This may be caused by:   °¨ Cancer.   °¨ Kidney stones.   °¨ An enlarged prostate in males. °SIGNS AND SYMPTOMS  °Because the kidney damage in chronic kidney disease occurs slowly, symptoms develop slowly and may not be obvious until the kidney damage becomes severe. A person may have a kidney disease for years without showing any symptoms. Symptoms can include:  °· Swelling (edema) of the legs, ankles, or feet.   °· Tiredness (lethargy).   °· Nausea or vomiting.   °· Confusion.   °· Problems with urination, such as:    °¨ Decreased urine production.   °¨ Frequent urination, especially at night.   °¨ Frequent accidents in children who are potty trained.   °· Muscle twitches and cramps.   °· Shortness of breath.  °· Weakness.   °· Persistent itchiness.   °· Loss of appetite. °· Metallic taste in the mouth. °· Trouble sleeping. °· Slowed development in children. °· Short stature in children. °DIAGNOSIS  °Chronic kidney disease may be detected and diagnosed by tests, including blood, urine, imaging, or kidney biopsy tests.  °TREATMENT  °Most chronic kidney diseases cannot be cured. Treatment usually involves relieving symptoms and preventing or slowing the progression of the disease. Treatment may include:  °· A special diet. You may need to avoid alcohol and foods that are salty and high in potassium.   °· Medicines. These may:   °¨ Lower blood pressure.   °¨ Relieve anemia.   °¨ Relieve swelling.   °¨ Protect the bones. °HOME CARE INSTRUCTIONS  °· Follow your prescribed diet.   °· Take medicines only as directed by your health care provider. Do not take any new medicines (prescription, over-the-counter, or nutritional supplements) unless approved by your health care provider. Many medicines can worsen your kidney damage or need to have the dose adjusted.   °· Quit smoking if you smoke. Talk to your health care provider about a smoking cessation program.   °· Keep all follow-up visits as directed by your health care provider. °SEEK IMMEDIATE MEDICAL CARE IF: °· Your symptoms get worse or you develop new symptoms.   °· You develop symptoms of end-stage kidney disease. These   include:   °¨ Headaches.   °¨ Abnormally dark or light skin.   °¨ Numbness in the hands or feet.   °¨ Easy bruising.   °¨ Frequent hiccups.   °¨ Menstruation stops.   °· You have a fever.   °· You have decreased urine production.   °· You have pain or bleeding when urinating. °MAKE SURE YOU: °· Understand these instructions. °· Will watch your condition. °· Will  get help right away if you are not doing well or get worse. °FOR MORE INFORMATION  °· American Association of Kidney Patients: www.aakp.org °· National Kidney Foundation: www.kidney.org °· American Kidney Fund: www.akfinc.org °· [employer] Rehabilitation Program: www.lifeoptions.org and www.kidneyschool.org °Document Released: 08/15/2008 Document Revised: 03/23/2014 Document Reviewed: 07/05/2012 °ExitCare® Patient Information ©2015 [employer]. This information is not intended to replace advice given to you by your health care provider. Make sure you discuss any questions you have with your health care provider. ° °

## 2014-06-25 NOTE — Progress Notes (Signed)
Orthostatic VS completed this AM. Pt's numbers were othostatic, but patient denied dizziness.

## 2014-06-30 ENCOUNTER — Other Ambulatory Visit: Payer: Self-pay | Admitting: *Deleted

## 2014-07-01 ENCOUNTER — Encounter (HOSPITAL_COMMUNITY): Payer: Self-pay | Admitting: Pharmacy Technician

## 2014-07-02 ENCOUNTER — Encounter (HOSPITAL_COMMUNITY)
Admission: RE | Admit: 2014-07-02 | Discharge: 2014-07-02 | Disposition: A | Discharge status: Home / Self Care | Payer: 59 | Source: Ambulatory Visit | Attending: Anesthesiology | Admitting: Anesthesiology

## 2014-07-02 ENCOUNTER — Encounter (HOSPITAL_COMMUNITY)
Admission: RE | Admit: 2014-07-02 | Discharge: 2014-07-02 | Disposition: A | Discharge status: Home / Self Care | Payer: 59 | Source: Ambulatory Visit | Attending: Vascular Surgery | Admitting: Vascular Surgery

## 2014-07-02 ENCOUNTER — Encounter (HOSPITAL_COMMUNITY): Payer: Self-pay

## 2014-07-02 DIAGNOSIS — I428 Other cardiomyopathies: Secondary | ICD-10-CM | POA: Diagnosis not present

## 2014-07-02 DIAGNOSIS — Z951 Presence of aortocoronary bypass graft: Secondary | ICD-10-CM | POA: Diagnosis not present

## 2014-07-02 DIAGNOSIS — Z8673 Personal history of transient ischemic attack (TIA), and cerebral infarction without residual deficits: Secondary | ICD-10-CM | POA: Diagnosis not present

## 2014-07-02 DIAGNOSIS — I129 Hypertensive chronic kidney disease with stage 1 through stage 4 chronic kidney disease, or unspecified chronic kidney disease: Secondary | ICD-10-CM | POA: Diagnosis not present

## 2014-07-02 DIAGNOSIS — N189 Chronic kidney disease, unspecified: Secondary | ICD-10-CM | POA: Insufficient documentation

## 2014-07-02 DIAGNOSIS — E119 Type 2 diabetes mellitus without complications: Secondary | ICD-10-CM | POA: Diagnosis not present

## 2014-07-02 DIAGNOSIS — Z01818 Encounter for other preprocedural examination: Secondary | ICD-10-CM | POA: Diagnosis present

## 2014-07-02 DIAGNOSIS — E785 Hyperlipidemia, unspecified: Secondary | ICD-10-CM | POA: Insufficient documentation

## 2014-07-02 DIAGNOSIS — I509 Heart failure, unspecified: Secondary | ICD-10-CM | POA: Insufficient documentation

## 2014-07-02 DIAGNOSIS — I2510 Atherosclerotic heart disease of native coronary artery without angina pectoris: Secondary | ICD-10-CM | POA: Diagnosis not present

## 2014-07-02 HISTORY — DX: Shortness of breath: R06.02

## 2014-07-02 HISTORY — DX: Personal history of other medical treatment: Z92.89

## 2014-07-02 HISTORY — DX: Atherosclerotic heart disease of native coronary artery without angina pectoris: I25.10

## 2014-07-02 HISTORY — DX: Malignant (primary) neoplasm, unspecified: C80.1

## 2014-07-02 HISTORY — DX: Acute myocardial infarction, unspecified: I21.9

## 2014-07-02 HISTORY — DX: Headache: R51

## 2014-07-02 HISTORY — DX: Dizziness and giddiness: R42

## 2014-07-02 HISTORY — DX: Anemia, unspecified: D64.9

## 2014-07-02 NOTE — Progress Notes (Signed)
Anesthesia Note:  Patient is a 73 year old male scheduled for left basilic vein transposition on 07/06/14 by Dr. Donnetta Hutching.  Procedure is posted for MAC anesthesia.   He recently moved to [place] from [place] on 06/19/14 to be closer to his son.  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  OSA screening score was 5.   He presented to the [facility] on the evening of 06/20/14 with HTN (high of 242/96) and Cr ~ 5. Apparently he had seen his PCP in [place] (Dr. Leane Platt, [phone]) earlier that week before his move and was notified that his Cr was elevated at 5.1 and needed further evaluation.  His Cr had been 2 or less until a few months ago. He was admitted with nephrology, cardiology, and vascular surgery consultations. He was diuresed and his is BP regimen was adjusted. Nephrology recommended hemodialysis access in the near future.  Cardiology (seen by Dr. Wynonia Lawman and later by Dr. Percival Spanish) recommended out-patient follow-up at [facility] CHF clinic. His previous cardiologist was Dr. Lyda Jester with the Cardiac Transplant Clinic at the [facility] in [place], [place] [phone]).  Patient has records from the last two years at home with him and plans to bring them with him on the day of surgery.  His new PCP in [place] will be Dr. Billey Chang.  Vitals: 138/79, HR 78, RR 18, O2 sat 100%, T 36.7 C.  His BP was taken while sitting up.  He said it can be significantly higher when he is lying down.  His hydralazine had to be discontinued and his  labetalol had to be decreased after his discharge from the hospital because he nearly passed out with standing once he got home.  He takes his BP and cardiac meds at 12N and 12MN.  He states he will throw up if he takes his medications on an empty stomach.  He tells me that he is not planning to take any medications on the morning of surgery (arrival time is 5:30 AM) as he will have taken his medications (including anti-rejection medications and labetalol) at MN with next dose due at 12N which should be post-op. His surgery is scheduled to end ~ 10AM, which would hopefully allow him the opportunity to take his medications at their usual scheduled time.  I did ask that he bring his medication in with him.  Depending on his BP, his anesthesiologist may still want him to take labetalol early--but since he says he will throw up it may have to be given intravenously.  I think this can be determined on the day of surgery.    EKG on 06/20/14 showed: SR, incomplete right BBB.  Echo on 06/21/14 showed: - Left ventricle: The cavity size was normal. Wall thickness was increased in a pattern of moderate LVH. Systolic function was normal. The estimated ejection fraction was in the range of 55% to 60%. Wall motion was normal; there were no regional wall motion abnormalities. Features are consistent with a pseudonormal left ventricular filling pattern, with concomitant abnormal relaxation and increased filling pressure (grade 2 diastolic  dysfunction). - Aortic valve: There was no regurgitation. - Mitral valve: There was no significant regurgitation. - Left atrium: The atrium was moderately dilated. - Right ventricle: The cavity size was normal. Systolic function was normal. - Right atrium: The atrium was mildly dilated. - Pulmonic valve: Trivial regurgitation. - Tricuspid valve: Trivial regurgitation. - Pulmonary arteries: PA peak pressure: 23 mm Hg (S). Impressions: Normal LV size with moderate LV hypertrophy. EF  55-60%. Normal RV size and systolic function. Biatrial enlargement. Transplanted heart.  He believes his last stress test was ~ 5 years ago.  Patient evaluated today at PAT.  He was examined in a wheelchair.  His wife was at his side.  He denies chest pain or SOB at rest. He has DOE which he feels is stable.  He continues to feel a little dizzy when he stands, but no presyncope since his medication was adjusted after he arrived home from the hospital. His weight has been stable since his discharge. He is able to lie flat if needed, but typically sleeps on his left or right side for comfort.  He denies any issues with anesthesia in the past.  Heart RRR. Lungs clear.  He is wearing compression stocking, but not significant edema noted.  He reports Dr. Lorrene Reid feels he will need to start hemodialysis within the next several months, but at this time there are no plans for catheter insertion.  Hopes are that his AVF will mature by the time he requires dialysis.      Renal artery duplex on 06/22/14 showed: There is no obvious evidence of hemodynamically significant renal artery stenosis greater than 60%. The left intrarenal arteries exhibit elevated resistive indices, suggestive of intrarenal disease. 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 Although the aorta is within normal limits, limited evaluation reveals an area of dilation in the mid segment, however due to overlying bowel gas it was not thoroughly visualized.  CXR on 07/02/14 showed: Post heart transplant. COPD changes with atelectasis and/or scarring in RIGHT middle lobe.  Labs from 06/25/14 noted.  Cr 6.39. K 4.2.  H/H 9.0/28.3.  Glucose 96. He reportedly had labs when he saw Dr. Lorrene Reid on 06/29/14.  Records requested but are still pending.  He was told his potassium was stable. He will get an ISTAT on arrival.  Prior to patient's arrival to PAT, I discussed his history and recent hospitalization  records and testing with anesthesiologist Dr. Glennon Mac.  Echo showed LVH, normal LVEF, grade II diastolic dysfunction. No additional cardiac testing was ordered at that time. Worsening renal function appear to be the more acute issue at present. If no acute changes and same day labs are acceptable then it is anticipated that he can proceed as planned.  Would likely favor MAC over GA if possible. If this is not a staged BVT, he may require GA.  Patient is comfortable talking further with his assigned anesthesiologist and Dr. Donnetta Hutching on the day of surgery to determine the definitive anesthesia plan.   [facility] Short Stay Center/Anesthesiology Phone [phone] 07/02/2014 5:54 PM

## 2014-07-02 NOTE — Progress Notes (Signed)
07/02/14 1523  OBSTRUCTIVE SLEEP APNEA  Have you ever been diagnosed with sleep apnea through a sleep study? No  Do you snore loudly (loud enough to be heard through closed doors)?  0  Do you often feel tired, fatigued, or sleepy during the daytime? 1  Has anyone observed you stop breathing during your sleep? 1  Do you have, or are you being treated for high blood pressure? 1  BMI more than 35 kg/m2? 0  Age over 74 years old? 1  Neck circumference greater than 40 cm/16 inches? 0  Gender: 1  Obstructive Sleep Apnea Score 5  Score 4 or greater  Results sent to PCP

## 2014-07-02 NOTE — Anesthesia Preprocedure Evaluation (Addendum)
Anesthesia Evaluation  Patient identified by MRN, date of birth, ID band Patient awake    Airway Mallampati: II TM Distance: >3 FB Neck ROM: Full    Dental  (+) Dental Advisory Given, Teeth Intact   Pulmonary shortness of breath and with exertion,  breath sounds clear to auscultation        Cardiovascular hypertension, Pt. on medications and Pt. on home beta blockers + CAD and + Past MI Rhythm:Regular Rate:Normal  Heart transplant 2001   Neuro/Psych  Headaches, CVA    GI/Hepatic   Endo/Other  diabetesHypothyroidism   Renal/GU ESRFRenal disease     Musculoskeletal   Abdominal   Peds  Hematology  (+) anemia ,   Anesthesia Other Findings   Reproductive/Obstetrics                        Anesthesia Physical Anesthesia Plan  ASA: IV  Anesthesia Plan: MAC   Post-op Pain Management:    Induction: Intravenous  Airway Management Planned: Nasal Cannula  Additional Equipment:   Intra-op Plan:   Post-operative Plan:   Informed Consent: I have reviewed the patients History and Physical, chart, labs and discussed the procedure including the risks, benefits and alternatives for the proposed anesthesia with the patient or authorized representative who has indicated his/her understanding and acceptance.   Dental advisory given  Plan Discussed with: Anesthesiologist and Surgeon  Anesthesia Plan Comments: (History of cardiac transplant now with worsening renal failure. HTN has been difficult to manage due to significant orthostatic hypotension. See my anesthesia note for additional details.  Myra Gianotti, PA-C )       Anesthesia Quick Evaluation

## 2014-07-02 NOTE — Pre-Procedure Instructions (Addendum)
[name] [name]  07/02/2014   Your procedure is scheduled on:  Monday, August 17.  Report to [facility] Admitting at 5:30 AM.  Call this number if you have problems the morning of surgery: [phone]   Remember:   Do not eat food or drink liquids after midnight Sunday, August 16.   Take these medicines the morning of surgery with A SIP OF WATER: cycloSPORINE modified (NEORAL), FLUoxetine (PROZAC), labetalol (NORMODYNE), lansoprazole (PREVACID),  levothyroxine (SYNTHROID, LEVOTHROID).    Do not wear jewelry.  Do not wear lotions, powders, or perfumes.              Men may shave face and neck.  Do not bring valuables to the [place] is not responsible  for any belongings or valuables.                Contacts, dentures or bridgework may not be worn into surgery.  Leave suitcase in the car. After surgery it may be brought to your room.  For patients admitted to the hospital, discharge time is determined by your  treatment team.               Patients discharged the day of surgery will not be allowed to drive home   Special Instructions: Review  [place] [facility] - Preparing For Surgery.   Please read over the following fact sheets that you were given: Pain Booklet, Coughing and Deep Breathing and Surgical Site Infection Prevention  [facility] - Preparing for Surgery  Before surgery, you can play an important role.  Because skin is not sterile, your skin needs to be as free of germs as possible.  You can reduce the number of germs on you skin by washing with CHG (chlorahexidine gluconate) soap before surgery.  CHG is an antiseptic cleaner which kills germs and bonds with the skin to continue killing germs even after washing.  Please DO NOT use if you have an allergy to CHG or antibacterial soaps.  If your skin becomes reddened/irritated stop using the CHG and inform your nurse when you arrive at Short Stay.  Do not shave (including legs and underarms) for  at least 48 hours prior to the first CHG shower.  You may shave your face.  Please follow these instructions carefully:   1.  Shower with CHG Soap the night before surgery and the morning of Surgery.  2.  If you choose to wash your hair, wash your hair first as usual with your normal shampoo.  3.  After you shampoo, rinse your hair and body thoroughly to remove the shampoo.  4.  Use CHG as you would any other liquid soap.  You can apply CHG directly to the skin and wash gently with scrungie or a clean washcloth.  5.  Apply the CHG Soap to your body ONLY FROM THE NECK DOWN.  Do not use on open wounds or open sores.  Avoid contact with your eyes, ears, mouth and genitals (private parts).  Wash genitals (private parts) with your normal soap.  6.  Wash thoroughly, paying special attention to the area where your surgery will be performed.  7.  Thoroughly rinse your body with warm water from the neck down.  8.  DO NOT shower/wash with your normal soap after using and rinsing off the CHG Soap.  9.  Pat yourself dry with a clean towel.  10.  Wear clean pajamas.            11.  Place clean sheets on your bed the night of your first shower and do not sleep with pets.  Day of Surgery  Do not apply any lotions/deoderants the morning of surgery.  Please wear clean clothes to the hospital/surgery center.

## 2014-07-02 NOTE — Progress Notes (Signed)
Primary - dr. Annamarie Dawley andy (has not seen yet since moving from [place]) Neph. - dr. Lorrene Reid Cardiology - dr. Izell Hickory in [place] - has not seen cardiology since moving to [place] other than while in hospital for bp control.

## 2014-07-05 MED ORDER — DEXTROSE 5 % IV SOLN
1.5000 g | INTRAVENOUS | Status: AC
  Administered 2014-07-06: 1.5 g via INTRAVENOUS
  Filled 2014-07-05: qty 1.5

## 2014-07-05 MED ORDER — SODIUM CHLORIDE 0.9 % IV SOLN
INTRAVENOUS | Status: DC
  Administered 2014-07-06 (×2): via INTRAVENOUS

## 2014-07-06 ENCOUNTER — Encounter (HOSPITAL_COMMUNITY)
Admission: RE | Disposition: A | Discharge status: Home / Self Care | Payer: Self-pay | Source: Ambulatory Visit | Attending: Vascular Surgery

## 2014-07-06 ENCOUNTER — Encounter (HOSPITAL_COMMUNITY): Payer: Self-pay | Admitting: *Deleted

## 2014-07-06 ENCOUNTER — Encounter (HOSPITAL_COMMUNITY): Payer: 59 | Admitting: Vascular Surgery

## 2014-07-06 ENCOUNTER — Ambulatory Visit (HOSPITAL_COMMUNITY)
Admission: RE | Admit: 2014-07-06 | Discharge: 2014-07-06 | Disposition: A | Discharge status: Home / Self Care | Payer: 59 | Source: Ambulatory Visit | Attending: Vascular Surgery | Admitting: Vascular Surgery

## 2014-07-06 ENCOUNTER — Ambulatory Visit (HOSPITAL_COMMUNITY): Payer: 59 | Admitting: Critical Care Medicine

## 2014-07-06 ENCOUNTER — Emergency Department (HOSPITAL_COMMUNITY): Payer: Medicare Other

## 2014-07-06 ENCOUNTER — Encounter (HOSPITAL_COMMUNITY): Payer: Self-pay | Admitting: Emergency Medicine

## 2014-07-06 ENCOUNTER — Telehealth: Payer: Self-pay | Admitting: Vascular Surgery

## 2014-07-06 ENCOUNTER — Observation Stay (HOSPITAL_COMMUNITY): Payer: Medicare Other

## 2014-07-06 ENCOUNTER — Observation Stay (HOSPITAL_COMMUNITY)
Admission: EM | Admit: 2014-07-06 | Discharge: 2014-07-10 | Disposition: A | Discharge status: Home / Self Care | Payer: Medicare Other | Attending: Internal Medicine | Admitting: Internal Medicine

## 2014-07-06 DIAGNOSIS — S42302A Unspecified fracture of shaft of humerus, left arm, initial encounter for closed fracture: Secondary | ICD-10-CM

## 2014-07-06 DIAGNOSIS — Z9849 Cataract extraction status, unspecified eye: Secondary | ICD-10-CM | POA: Diagnosis not present

## 2014-07-06 DIAGNOSIS — N179 Acute kidney failure, unspecified: Secondary | ICD-10-CM

## 2014-07-06 DIAGNOSIS — Z7982 Long term (current) use of aspirin: Secondary | ICD-10-CM | POA: Diagnosis not present

## 2014-07-06 DIAGNOSIS — I252 Old myocardial infarction: Secondary | ICD-10-CM | POA: Insufficient documentation

## 2014-07-06 DIAGNOSIS — R42 Dizziness and giddiness: Secondary | ICD-10-CM | POA: Insufficient documentation

## 2014-07-06 DIAGNOSIS — N184 Chronic kidney disease, stage 4 (severe): Secondary | ICD-10-CM | POA: Diagnosis not present

## 2014-07-06 DIAGNOSIS — I1311 Hypertensive heart and chronic kidney disease without heart failure, with stage 5 chronic kidney disease, or end stage renal disease: Secondary | ICD-10-CM | POA: Diagnosis not present

## 2014-07-06 DIAGNOSIS — E119 Type 2 diabetes mellitus without complications: Secondary | ICD-10-CM | POA: Diagnosis not present

## 2014-07-06 DIAGNOSIS — Z79899 Other long term (current) drug therapy: Secondary | ICD-10-CM | POA: Insufficient documentation

## 2014-07-06 DIAGNOSIS — W06XXXA Fall from bed, initial encounter: Secondary | ICD-10-CM | POA: Diagnosis not present

## 2014-07-06 DIAGNOSIS — E039 Hypothyroidism, unspecified: Secondary | ICD-10-CM

## 2014-07-06 DIAGNOSIS — N189 Chronic kidney disease, unspecified: Secondary | ICD-10-CM

## 2014-07-06 DIAGNOSIS — I2510 Atherosclerotic heart disease of native coronary artery without angina pectoris: Secondary | ICD-10-CM | POA: Diagnosis not present

## 2014-07-06 DIAGNOSIS — Y9389 Activity, other specified: Secondary | ICD-10-CM | POA: Diagnosis not present

## 2014-07-06 DIAGNOSIS — D649 Anemia, unspecified: Secondary | ICD-10-CM | POA: Insufficient documentation

## 2014-07-06 DIAGNOSIS — Z85828 Personal history of other malignant neoplasm of skin: Secondary | ICD-10-CM | POA: Diagnosis not present

## 2014-07-06 DIAGNOSIS — S42293A Other displaced fracture of upper end of unspecified humerus, initial encounter for closed fracture: Secondary | ICD-10-CM | POA: Insufficient documentation

## 2014-07-06 DIAGNOSIS — Z951 Presence of aortocoronary bypass graft: Secondary | ICD-10-CM | POA: Insufficient documentation

## 2014-07-06 DIAGNOSIS — Z888 Allergy status to other drugs, medicaments and biological substances status: Secondary | ICD-10-CM | POA: Insufficient documentation

## 2014-07-06 DIAGNOSIS — Z941 Heart transplant status: Secondary | ICD-10-CM | POA: Insufficient documentation

## 2014-07-06 DIAGNOSIS — Z8673 Personal history of transient ischemic attack (TIA), and cerebral infarction without residual deficits: Secondary | ICD-10-CM | POA: Insufficient documentation

## 2014-07-06 DIAGNOSIS — Y92009 Unspecified place in unspecified non-institutional (private) residence as the place of occurrence of the external cause: Secondary | ICD-10-CM | POA: Diagnosis not present

## 2014-07-06 DIAGNOSIS — I129 Hypertensive chronic kidney disease with stage 1 through stage 4 chronic kidney disease, or unspecified chronic kidney disease: Secondary | ICD-10-CM | POA: Insufficient documentation

## 2014-07-06 DIAGNOSIS — E118 Type 2 diabetes mellitus with unspecified complications: Secondary | ICD-10-CM

## 2014-07-06 DIAGNOSIS — E1165 Type 2 diabetes mellitus with hyperglycemia: Secondary | ICD-10-CM

## 2014-07-06 DIAGNOSIS — M25512 Pain in left shoulder: Secondary | ICD-10-CM

## 2014-07-06 DIAGNOSIS — I951 Orthostatic hypotension: Principal | ICD-10-CM

## 2014-07-06 DIAGNOSIS — Y998 Other external cause status: Secondary | ICD-10-CM | POA: Insufficient documentation

## 2014-07-06 DIAGNOSIS — N185 Chronic kidney disease, stage 5: Secondary | ICD-10-CM

## 2014-07-06 DIAGNOSIS — IMO0002 Reserved for concepts with insufficient information to code with codable children: Secondary | ICD-10-CM

## 2014-07-06 DIAGNOSIS — R55 Syncope and collapse: Secondary | ICD-10-CM

## 2014-07-06 DIAGNOSIS — R0602 Shortness of breath: Secondary | ICD-10-CM | POA: Insufficient documentation

## 2014-07-06 DIAGNOSIS — E860 Dehydration: Secondary | ICD-10-CM

## 2014-07-06 DIAGNOSIS — I119 Hypertensive heart disease without heart failure: Secondary | ICD-10-CM

## 2014-07-06 DIAGNOSIS — E785 Hyperlipidemia, unspecified: Secondary | ICD-10-CM | POA: Diagnosis not present

## 2014-07-06 DIAGNOSIS — B259 Cytomegaloviral disease, unspecified: Secondary | ICD-10-CM

## 2014-07-06 DIAGNOSIS — I10 Essential (primary) hypertension: Secondary | ICD-10-CM

## 2014-07-06 DIAGNOSIS — E7800 Pure hypercholesterolemia, unspecified: Secondary | ICD-10-CM

## 2014-07-06 HISTORY — PX: BASCILIC VEIN TRANSPOSITION: SHX5742

## 2014-07-06 LAB — BASIC METABOLIC PANEL
Anion gap: 19 — ABNORMAL HIGH (ref 5–15)
BUN: 78 mg/dL — ABNORMAL HIGH (ref 6–23)
CHLORIDE: 98 meq/L (ref 96–112)
CO2: 22 meq/L (ref 19–32)
Calcium: 8.8 mg/dL (ref 8.4–10.5)
Creatinine, Ser: 6.39 mg/dL — ABNORMAL HIGH (ref 0.50–1.35)
GFR calc Af Amer: 9 mL/min — ABNORMAL LOW (ref >90)
GFR calc non Af Amer: 8 mL/min — ABNORMAL LOW (ref >90)
Glucose, Bld: 185 mg/dL — ABNORMAL HIGH (ref 70–99)
Potassium: 3.4 mEq/L — ABNORMAL LOW (ref 3.7–5.3)
Sodium: 139 mEq/L (ref 137–147)

## 2014-07-06 LAB — TROPONIN I: Troponin I: <0.3 ng/mL (ref <0.30)

## 2014-07-06 LAB — PHOSPHORUS: PHOSPHORUS: 5 mg/dL — AB (ref 2.3–4.6)

## 2014-07-06 LAB — CBC
HEMATOCRIT: 32.1 % — AB (ref 39.0–52.0)
Hemoglobin: 10.3 g/dL — ABNORMAL LOW (ref 13.0–17.0)
MCH: 30.3 pg (ref 26.0–34.0)
MCHC: 32.1 g/dL (ref 30.0–36.0)
MCV: 94.4 fL (ref 78.0–100.0)
Platelets: 270 10*3/uL (ref 150–400)
RBC: 3.4 MIL/uL — ABNORMAL LOW (ref 4.22–5.81)
RDW: 14.7 % (ref 11.5–15.5)
WBC: 10.4 10*3/uL (ref 4.0–10.5)

## 2014-07-06 LAB — MAGNESIUM: Magnesium: 1.6 mg/dL (ref 1.5–2.5)

## 2014-07-06 LAB — GLUCOSE, CAPILLARY
GLUCOSE-CAPILLARY: 117 mg/dL — AB (ref 70–99)
Glucose-Capillary: 205 mg/dL — ABNORMAL HIGH (ref 70–99)

## 2014-07-06 LAB — POCT I-STAT 4, (NA,K, GLUC, HGB,HCT)
Glucose, Bld: 134 mg/dL — ABNORMAL HIGH (ref 70–99)
HCT: 35 % — ABNORMAL LOW (ref 39.0–52.0)
HEMOGLOBIN: 11.9 g/dL — AB (ref 13.0–17.0)
Potassium: 3.7 mEq/L (ref 3.7–5.3)
Sodium: 138 mEq/L (ref 137–147)

## 2014-07-06 LAB — CBG MONITORING, ED: Glucose-Capillary: 211 mg/dL — ABNORMAL HIGH (ref 70–99)

## 2014-07-06 SURGERY — TRANSPOSITION, VEIN, BASILIC
Anesthesia: Monitor Anesthesia Care | Site: Arm Upper | Laterality: Left

## 2014-07-06 MED ORDER — OXYCODONE HCL 5 MG PO TABS: 5.0000 mg | ORAL_TABLET | Freq: Four times a day (QID) | ORAL | Status: DC | PRN

## 2014-07-06 MED ORDER — OXYCODONE HCL 5 MG PO TABS
5.0000 mg | ORAL_TABLET | Freq: Four times a day (QID) | ORAL | Status: DC | PRN
  Administered 2014-07-07 – 2014-07-08 (×2): 5 mg via ORAL
  Filled 2014-07-06 (×2): qty 1

## 2014-07-06 MED ORDER — PROPOFOL 10 MG/ML IV EMUL
INTRAVENOUS | Status: AC
  Filled 2014-07-06: qty 100

## 2014-07-06 MED ORDER — FUROSEMIDE 40 MG PO TABS
40.0000 mg | ORAL_TABLET | Freq: Two times a day (BID) | ORAL | Status: DC
  Administered 2014-07-06: 40 mg via ORAL
  Filled 2014-07-06 (×5): qty 1

## 2014-07-06 MED ORDER — ONDANSETRON HCL 4 MG/2ML IJ SOLN
INTRAMUSCULAR | Status: DC | PRN
  Administered 2014-07-06: 4 mg via INTRAVENOUS

## 2014-07-06 MED ORDER — FERROUS SULFATE 325 (65 FE) MG PO TABS
325.0000 mg | ORAL_TABLET | Freq: Three times a day (TID) | ORAL | Status: DC
  Administered 2014-07-07 – 2014-07-10 (×10): 325 mg via ORAL
  Filled 2014-07-06 (×13): qty 1

## 2014-07-06 MED ORDER — SUCCINYLCHOLINE CHLORIDE 20 MG/ML IJ SOLN
INTRAMUSCULAR | Status: AC
  Filled 2014-07-06: qty 1

## 2014-07-06 MED ORDER — FENTANYL CITRATE 0.05 MG/ML IJ SOLN: 25.0000 ug | INTRAMUSCULAR | Status: DC | PRN

## 2014-07-06 MED ORDER — ONDANSETRON HCL 4 MG/2ML IJ SOLN: 4.0000 mg | Freq: Once | INTRAMUSCULAR | Status: DC | PRN

## 2014-07-06 MED ORDER — SODIUM CHLORIDE 0.9 % IV BOLUS (SEPSIS)
1000.0000 mL | Freq: Once | INTRAVENOUS | Status: AC
  Administered 2014-07-06: 1000 mL via INTRAVENOUS

## 2014-07-06 MED ORDER — LIDOCAINE-EPINEPHRINE 0.5 %-1:200000 IJ SOLN
INTRAMUSCULAR | Status: DC | PRN
  Administered 2014-07-06: 10 mL

## 2014-07-06 MED ORDER — CHLORHEXIDINE GLUCONATE CLOTH 2 % EX PADS: 6.0000 | MEDICATED_PAD | Freq: Once | CUTANEOUS | Status: DC

## 2014-07-06 MED ORDER — ENOXAPARIN SODIUM 30 MG/0.3ML ~~LOC~~ SOLN
30.0000 mg | SUBCUTANEOUS | Status: DC
  Administered 2014-07-06: 30 mg via SUBCUTANEOUS
  Filled 2014-07-06 (×2): qty 0.3

## 2014-07-06 MED ORDER — SODIUM BICARBONATE 650 MG PO TABS
650.0000 mg | ORAL_TABLET | Freq: Three times a day (TID) | ORAL | Status: DC
  Administered 2014-07-06 – 2014-07-10 (×11): 650 mg via ORAL
  Filled 2014-07-06 (×13): qty 1

## 2014-07-06 MED ORDER — OXYCODONE HCL 5 MG PO TABS: 5.0000 mg | ORAL_TABLET | Freq: Once | ORAL | Status: DC | PRN

## 2014-07-06 MED ORDER — ONDANSETRON HCL 4 MG/2ML IJ SOLN: 4.0000 mg | Freq: Four times a day (QID) | INTRAMUSCULAR | Status: DC | PRN

## 2014-07-06 MED ORDER — ARTIFICIAL TEARS OP OINT
TOPICAL_OINTMENT | OPHTHALMIC | Status: AC
  Filled 2014-07-06: qty 3.5

## 2014-07-06 MED ORDER — MIDAZOLAM HCL 5 MG/5ML IJ SOLN
INTRAMUSCULAR | Status: DC | PRN
  Administered 2014-07-06: 2 mg via INTRAVENOUS

## 2014-07-06 MED ORDER — MIDAZOLAM HCL 2 MG/2ML IJ SOLN
INTRAMUSCULAR | Status: AC
  Filled 2014-07-06: qty 2

## 2014-07-06 MED ORDER — FLUOXETINE HCL 20 MG PO CAPS
20.0000 mg | ORAL_CAPSULE | Freq: Every day | ORAL | Status: DC
  Administered 2014-07-07 – 2014-07-10 (×4): 20 mg via ORAL
  Filled 2014-07-06 (×5): qty 1

## 2014-07-06 MED ORDER — CALCITRIOL 0.25 MCG PO CAPS
0.2500 ug | ORAL_CAPSULE | ORAL | Status: DC
  Administered 2014-07-06 – 2014-07-10 (×3): 0.25 ug via ORAL
  Filled 2014-07-06 (×3): qty 1

## 2014-07-06 MED ORDER — FENTANYL CITRATE 0.05 MG/ML IJ SOLN
INTRAMUSCULAR | Status: DC | PRN
  Administered 2014-07-06 (×2): 25 ug via INTRAVENOUS
  Administered 2014-07-06: 50 ug via INTRAVENOUS

## 2014-07-06 MED ORDER — SODIUM CHLORIDE 0.9 % IJ SOLN
3.0000 mL | Freq: Two times a day (BID) | INTRAMUSCULAR | Status: DC
  Administered 2014-07-06: 3 mL via INTRAVENOUS

## 2014-07-06 MED ORDER — ASPIRIN 325 MG PO TABS
325.0000 mg | ORAL_TABLET | Freq: Every day | ORAL | Status: DC
  Administered 2014-07-06 – 2014-07-09 (×4): 325 mg via ORAL
  Filled 2014-07-06 (×5): qty 1

## 2014-07-06 MED ORDER — ONDANSETRON 8 MG PO TBDP: 8.0000 mg | ORAL_TABLET | Freq: Three times a day (TID) | ORAL | Status: DC | PRN

## 2014-07-06 MED ORDER — FENTANYL CITRATE 0.05 MG/ML IJ SOLN
INTRAMUSCULAR | Status: AC
  Filled 2014-07-06: qty 5

## 2014-07-06 MED ORDER — MYCOPHENOLATE MOFETIL 250 MG PO CAPS
500.0000 mg | ORAL_CAPSULE | Freq: Every day | ORAL | Status: DC
  Administered 2014-07-07 – 2014-07-10 (×4): 500 mg via ORAL
  Filled 2014-07-06 (×5): qty 2

## 2014-07-06 MED ORDER — VITAMIN B-12 1000 MCG PO TABS
1000.0000 ug | ORAL_TABLET | Freq: Every day | ORAL | Status: DC
  Administered 2014-07-07 – 2014-07-10 (×4): 1000 ug via ORAL
  Filled 2014-07-06 (×5): qty 1

## 2014-07-06 MED ORDER — HYDRALAZINE HCL 20 MG/ML IJ SOLN
10.0000 mg | INTRAMUSCULAR | Status: DC | PRN
  Administered 2014-07-06 – 2014-07-08 (×2): 10 mg via INTRAVENOUS
  Filled 2014-07-06 (×2): qty 1

## 2014-07-06 MED ORDER — HEPARIN SODIUM (PORCINE) 5000 UNIT/ML IJ SOLN
INTRAMUSCULAR | Status: DC | PRN
  Administered 2014-07-06: 07:00:00

## 2014-07-06 MED ORDER — EPHEDRINE SULFATE 50 MG/ML IJ SOLN
INTRAMUSCULAR | Status: AC
  Filled 2014-07-06: qty 1

## 2014-07-06 MED ORDER — ACETAMINOPHEN 650 MG RE SUPP: 650.0000 mg | Freq: Four times a day (QID) | RECTAL | Status: DC | PRN

## 2014-07-06 MED ORDER — PANTOPRAZOLE SODIUM 40 MG PO TBEC
40.0000 mg | DELAYED_RELEASE_TABLET | Freq: Every day | ORAL | Status: DC
  Administered 2014-07-06 – 2014-07-10 (×5): 40 mg via ORAL
  Filled 2014-07-06 (×6): qty 1

## 2014-07-06 MED ORDER — SODIUM CHLORIDE 0.9 % IJ SOLN
INTRAMUSCULAR | Status: AC
  Filled 2014-07-06: qty 10

## 2014-07-06 MED ORDER — LEVOTHYROXINE SODIUM 75 MCG PO TABS
75.0000 ug | ORAL_TABLET | Freq: Every day | ORAL | Status: DC
  Administered 2014-07-07 – 2014-07-10 (×4): 75 ug via ORAL
  Filled 2014-07-06 (×5): qty 1

## 2014-07-06 MED ORDER — LIDOCAINE-EPINEPHRINE 0.5 %-1:200000 IJ SOLN
INTRAMUSCULAR | Status: AC
  Filled 2014-07-06: qty 1

## 2014-07-06 MED ORDER — ACETAMINOPHEN 325 MG PO TABS
650.0000 mg | ORAL_TABLET | Freq: Four times a day (QID) | ORAL | Status: DC | PRN
Start: 2014-07-06 — End: 2014-07-10

## 2014-07-06 MED ORDER — PROPOFOL 10 MG/ML IV BOLUS
INTRAVENOUS | Status: AC
  Filled 2014-07-06: qty 20

## 2014-07-06 MED ORDER — LABETALOL HCL 100 MG PO TABS
100.0000 mg | ORAL_TABLET | Freq: Three times a day (TID) | ORAL | Status: DC
  Administered 2014-07-06 – 2014-07-07 (×4): 100 mg via ORAL
  Filled 2014-07-06 (×7): qty 1

## 2014-07-06 MED ORDER — PROPOFOL INFUSION 10 MG/ML OPTIME
INTRAVENOUS | Status: DC | PRN
  Administered 2014-07-06: 75 ug/kg/min via INTRAVENOUS

## 2014-07-06 MED ORDER — CYCLOSPORINE MODIFIED (NEORAL) 25 MG PO CAPS
50.0000 mg | ORAL_CAPSULE | Freq: Every day | ORAL | Status: DC
  Administered 2014-07-07 – 2014-07-10 (×4): 50 mg via ORAL
  Filled 2014-07-06 (×5): qty 2

## 2014-07-06 MED ORDER — SIMVASTATIN 5 MG PO TABS
5.0000 mg | ORAL_TABLET | Freq: Every day | ORAL | Status: DC
  Administered 2014-07-06 – 2014-07-08 (×3): 5 mg via ORAL
  Filled 2014-07-06 (×4): qty 1

## 2014-07-06 MED ORDER — ONDANSETRON HCL 4 MG PO TABS: 4.0000 mg | ORAL_TABLET | Freq: Four times a day (QID) | ORAL | Status: DC | PRN

## 2014-07-06 MED ORDER — SACCHAROMYCES BOULARDII 250 MG PO CAPS
250.0000 mg | ORAL_CAPSULE | ORAL | Status: DC
  Administered 2014-07-07 – 2014-07-09 (×2): 250 mg via ORAL
  Filled 2014-07-06 (×2): qty 1

## 2014-07-06 MED ORDER — INSULIN ASPART 100 UNIT/ML ~~LOC~~ SOLN
0.0000 [IU] | Freq: Three times a day (TID) | SUBCUTANEOUS | Status: DC
  Administered 2014-07-07: 1 [IU] via SUBCUTANEOUS
  Administered 2014-07-07 – 2014-07-09 (×5): 2 [IU] via SUBCUTANEOUS

## 2014-07-06 MED ORDER — 0.9 % SODIUM CHLORIDE (POUR BTL) OPTIME
TOPICAL | Status: DC | PRN
  Administered 2014-07-06: 1000 mL

## 2014-07-06 MED ORDER — OXYCODONE HCL 5 MG/5ML PO SOLN: 5.0000 mg | Freq: Once | ORAL | Status: DC | PRN

## 2014-07-06 MED ORDER — MYCOPHENOLATE MOFETIL 250 MG PO CAPS
250.0000 mg | ORAL_CAPSULE | Freq: Every day | ORAL | Status: DC
  Administered 2014-07-06 – 2014-07-09 (×4): 250 mg via ORAL
  Filled 2014-07-06 (×5): qty 1

## 2014-07-06 MED ORDER — SODIUM CHLORIDE 0.9 % IJ SOLN
3.0000 mL | Freq: Two times a day (BID) | INTRAMUSCULAR | Status: DC
  Administered 2014-07-06 – 2014-07-10 (×8): 3 mL via INTRAVENOUS

## 2014-07-06 MED ORDER — CYCLOSPORINE MODIFIED (NEORAL) 25 MG PO CAPS
25.0000 mg | ORAL_CAPSULE | Freq: Every day | ORAL | Status: DC
  Administered 2014-07-06 – 2014-07-09 (×4): 25 mg via ORAL
  Filled 2014-07-06 (×5): qty 1

## 2014-07-06 MED ORDER — ONDANSETRON HCL 4 MG/2ML IJ SOLN
INTRAMUSCULAR | Status: AC
  Filled 2014-07-06: qty 2

## 2014-07-06 MED ORDER — LIDOCAINE HCL (CARDIAC) 20 MG/ML IV SOLN
INTRAVENOUS | Status: AC
  Filled 2014-07-06: qty 5

## 2014-07-06 SURGICAL SUPPLY — 38 items
BENZOIN TINCTURE PRP APPL 2/3 (GAUZE/BANDAGES/DRESSINGS) ×3 IMPLANT
BLADE 10 SAFETY STRL DISP (BLADE) ×3 IMPLANT
CANISTER SUCTION 2500CC (MISCELLANEOUS) ×3 IMPLANT
CLIP LIGATING EXTRA MED SLVR (CLIP) ×3 IMPLANT
CLIP LIGATING EXTRA SM BLUE (MISCELLANEOUS) ×3 IMPLANT
CLOSURE WOUND 1/2 X4 (GAUZE/BANDAGES/DRESSINGS) ×1
COVER PROBE W GEL 5X96 (DRAPES) ×3 IMPLANT
COVER SURGICAL LIGHT HANDLE (MISCELLANEOUS) ×3 IMPLANT
DECANTER SPIKE VIAL GLASS SM (MISCELLANEOUS) ×3 IMPLANT
ELECT REM PT RETURN 9FT ADLT (ELECTROSURGICAL) ×3
ELECTRODE REM PT RTRN 9FT ADLT (ELECTROSURGICAL) ×1 IMPLANT
GAUZE SPONGE 4X4 12PLY STRL (GAUZE/BANDAGES/DRESSINGS) ×3 IMPLANT
GEL ULTRASOUND 20GR AQUASONIC (MISCELLANEOUS) IMPLANT
GLOVE BIOGEL PI IND STRL 6.5 (GLOVE) ×2 IMPLANT
GLOVE BIOGEL PI IND STRL 7.0 (GLOVE) ×4 IMPLANT
GLOVE BIOGEL PI INDICATOR 6.5 (GLOVE) ×4
GLOVE BIOGEL PI INDICATOR 7.0 (GLOVE) ×8
GLOVE ECLIPSE 6.5 STRL STRAW (GLOVE) ×9 IMPLANT
GLOVE SS BIOGEL STRL SZ 7.5 (GLOVE) ×1 IMPLANT
GLOVE SUPERSENSE BIOGEL SZ 7.5 (GLOVE) ×2
GOWN STRL REUS W/ TWL LRG LVL3 (GOWN DISPOSABLE) ×3 IMPLANT
GOWN STRL REUS W/TWL LRG LVL3 (GOWN DISPOSABLE) ×6
GOWN STRL REUS W/TWL XL LVL3 (GOWN DISPOSABLE) ×6 IMPLANT
KIT BASIN OR (CUSTOM PROCEDURE TRAY) ×3 IMPLANT
KIT ROOM TURNOVER OR (KITS) ×3 IMPLANT
NS IRRIG 1000ML POUR BTL (IV SOLUTION) ×3 IMPLANT
PACK CV ACCESS (CUSTOM PROCEDURE TRAY) ×3 IMPLANT
PAD ARMBOARD 7.5X6 YLW CONV (MISCELLANEOUS) ×6 IMPLANT
STRIP CLOSURE SKIN 1/2X4 (GAUZE/BANDAGES/DRESSINGS) ×2 IMPLANT
SUT PROLENE 6 0 CC (SUTURE) ×3 IMPLANT
SUT SILK 2 0 SH (SUTURE) ×3 IMPLANT
SUT VIC AB 3-0 SH 27 (SUTURE) ×4
SUT VIC AB 3-0 SH 27X BRD (SUTURE) ×2 IMPLANT
TAPE CLOTH SURG 4X10 WHT LF (GAUZE/BANDAGES/DRESSINGS) ×3 IMPLANT
TOWEL OR 17X24 6PK STRL BLUE (TOWEL DISPOSABLE) ×3 IMPLANT
TOWEL OR 17X26 10 PK STRL BLUE (TOWEL DISPOSABLE) ×3 IMPLANT
UNDERPAD 30X30 INCONTINENT (UNDERPADS AND DIAPERS) ×3 IMPLANT
WATER STERILE IRR 1000ML POUR (IV SOLUTION) ×3 IMPLANT

## 2014-07-06 NOTE — Op Note (Signed)
    OPERATIVE REPORT  DATE OF SURGERY: 07/06/2014  PATIENT: [name] [name], 73 y.o. male MRN: [id number]  DOB: 02-12-1941  PRE-OPERATIVE DIAGNOSIS: Chronic renal insufficiency  POST-OPERATIVE DIAGNOSIS:  Same  PROCEDURE: Left upper arm basilic vein transposition fistula  SURGEON:  Curt Jews, M.D.  PHYSICIAN ASSISTANT: Trinh  ANESTHESIA:  Local with sedation  EBL: Minimal ml  Total I/O In: 500 [I.V.:500] Out: 25 [Blood:25]  BLOOD ADMINISTERED: None  DRAINS: None  SPECIMEN: None  COUNTS CORRECT:  YES  PLAN OF CARE: PACU   PATIENT DISPOSITION:  PACU - hemodynamically stable  PROCEDURE DETAILS: The patient was taken to the upper and placed supine position where the area of the left arm left exit were prepped and draped in usual sterile fashion. SonoSite ultrasound was used to mark the level of the cephalic vein from the antecubital space to the axilla. Incision was made at the level of the antecubital space and carried down to isolate the brachial artery which was of good caliber and the basilic vein which was also of good caliber. The vein was mobilized proximally and medial and distally. A separate incision was made in the mid upper arm over the vein and the vein was exposed this level and finally an incision was made at the axilla and the vein was exposed this level as well. Tributary branches were ligated with 30 and 4-0 silk ties and divided. The vein was marked to prevent twisting. The vein was ligated distally and divided it was brought up through the prior created tunnel for the harvest of the basilic vein out through the incision at the axilla. The vein was gently dilated and found to be of excellent caliber. A tunnel was created in the subcutaneous tissue from the level of the brachial artery over the forearm to the level of the exit well. The vein was brought through the tunnel again taking care to prevent twisting. The brachial artery was occluded proximally and  distally was opened 11 blade salicylate Potts scissors. A small arteriotomy was created. The vein was cut to appropriate length and was sewn end-to-side to the artery with a running 6-0 Prolene suture. Clamps were removed and excellent thrill was noted. Patient continued to have a palpable left radial pulse. The wound irrigated with saline. Hemostasis daily cautery. Wounds are closed with 3-0 Vicryl in the subcutaneous and subcuticular tissue. Benzoin Steri-Strips were applied. Patient transferred to the recovery room in stable condition   Curt Jews, M.D. 07/06/2014 11:05 AM

## 2014-07-06 NOTE — Anesthesia Postprocedure Evaluation (Signed)
  Anesthesia Post-op Note  Patient: [name] [name]  Procedure(s) Performed: Procedure(s): BASCILIC VEIN TRANSPOSITION (Left)  Patient Location: PACU  Anesthesia Type:MAC  Level of Consciousness: awake, alert  and oriented  Airway and Oxygen Therapy: Patient Spontanous Breathing and Patient connected to nasal cannula oxygen  Post-op Pain: none  Post-op Assessment: Post-op Vital signs reviewed, Patient's Cardiovascular Status Stable, Respiratory Function Stable, Patent Airway and Pain level controlled  Post-op Vital Signs: stable  Last Vitals:  Filed Vitals:   07/06/14 1046  BP: 145/82  Pulse: 72  Temp:   Resp:     Complications: No apparent anesthesia complications

## 2014-07-06 NOTE — Anesthesia Procedure Notes (Signed)
Procedure Name: MAC Date/Time: 07/06/2014 7:35 AM Performed by: Carola Frost Pre-anesthesia Checklist: Patient identified, Timeout performed, Emergency Drugs available, Suction available and Patient being monitored Patient Re-evaluated:Patient Re-evaluated prior to inductionOxygen Delivery Method: Nasal cannula Intubation Type: IV induction Placement Confirmation: positive ETCO2 and breath sounds checked- equal and bilateral Dental Injury: Teeth and Oropharynx as per pre-operative assessment

## 2014-07-06 NOTE — Telephone Encounter (Signed)
Message copied by Gena Fray on Mon Jul 06, 2014 12:57 PM ------      Message from: Denman George      Created: Mon Jul 06, 2014 11:13 AM      Regarding: Zigmund Daniel log; also needs 4 week f/u w/ TFE; no duplex                   ----- Message -----         From: Alvia Grove, PA-C         Sent: 07/06/2014   9:23 AM           To: [facility] Charge Pool            S/p left BVT 07/06/14            F/u with Dr. Donnetta Hutching in 4 weeks. No duplex.            Thanks,       Maudie Mercury ------

## 2014-07-06 NOTE — ED Notes (Signed)
PT unable to tolerate standing position.  Dr Christy Gentles notified.

## 2014-07-06 NOTE — H&P (View-Only) (Signed)
Patient name: [name] [name] MRN: [id number] DOB: June 01, 1941 Sex: male   Referred by: Lorrene Reid  Reason for referral:  Chief Complaint  Patient presents with  . Abnormal Lab  . Hypertension    HISTORY OF PRESENT ILLNESS: Patient is a very pleasant 73 year old gentleman who just moved [place] from [place]. He has a long history of cardiac disease having undergone heart transplant in 2001. He has a long history of renal insufficiency and [place] and was told he was having worsening renal function. We are being consulted for discussion of hemodialysis access. He is making urine. He has no history of prior hemodialysis.  Past Medical History  Diagnosis Date  . CKD III-IV     a. since transplant in 2001 with significant worsening since 06/2013.  Marland Kitchen Hypertension     a. Long h/o HTN, came off of all meds following transplant/wt loss-->meds resumed 06/2013, difficult to control since.  . Diabetes mellitus without complication   . History of stroke   . Hypercalcemia     a. 06/2013 Hospitalized in [place].  Marland Kitchen Heart transplanted     a. 1995 CABG x 4 in [place], [place];  b. 2001 Developed CHF;  c. 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 10/2000 Cardiac transplant @ [facility], [place];  e. Historically nl Bx and stress testing.  Last stress test ~ 5 yrs ago.  Marland Kitchen Hernia of abdominal wall   . Hyperlipidemia   . Orthostatic hypotension     a. Has tried support hose and compression stockings - prefers not to wear.    Past Surgical History  Procedure Laterality Date  . Cardiac surgery    . Heart transplant    . Cataract extraction    . Vasectomy    . Skin graft full thickness leg      cat scratch that did not heal    History   Social History  . Marital Status: Married    Spouse Name: N/A    Number of Children: N/A  . Years of Education: N/A   Occupational History  . Education officer, community    Social History Main Topics  . Smoking status: Never Smoker   . Smokeless tobacco: Not on file    . Alcohol Use: No  . Drug Use: No  . Sexual Activity: Not on file   Other Topics Concern  . Not on file   Social History Narrative   Patient is from [place].  Moved to [place] in late '90's.  Moved to [place] 06/20/2014 to live closer to son.  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  Plans to continue to work now that he is in [place].    Family History  Problem Relation Age of Onset  . Heart attack Father     died @ 24  . Heart attack Mother     Allergies as of 06/20/2014 - Review Complete 06/20/2014  Allergen Reaction Noted  . Norvasc [amlodipine besylate] Swelling 06/20/2014    No current facility-administered medications on file prior to encounter.   No current outpatient prescriptions on file prior to encounter.     REVIEW OF SYSTEMS:  Reviewed in his history and physical with nothing to add  PHYSICAL EXAMINATION:  General: The patient is a well-nourished male, in no acute distress. Vital signs are BP 202/85  Pulse 76  Temp(Src) 97.5 F (36.4 C) (Oral)  Resp 23  Ht 6\' 2"  (1.88 m)  Wt 160 lb 4.4 oz (  72.7 kg)  BMI 20.57 kg/m2  SpO2 100% Pulmonary: There is a good air exchange  Musculoskeletal: There are no major deformities.  There is no significant extremity pain. Neurologic: No focal weakness or paresthesias are detected, Skin: There are no ulcer or rashes noted. Psychiatric: The patient has normal affect. Cardiovascular: 2+ radial pulses bilaterally Surface veins are relatively small. Does have a patent cephalic vein at the wrist with an IV site had been discontinued just above the wrist. Very small antecubital vein.   Vascular Lab Studies:  Upper extremity vein map is pending  Impression and Plan:  Long discussion with the patient. I explained options for hemodialysis to include hemodialysis catheter, AV graft and AV fistula. [place] would be to have functioning long-term access prior to initiation of hemodialysis. His vein map is  pending. I explained that if vein for equal right-to-left we would recommend his nondominant left arm as his initial access. I explained preference for fistula placement with the potential non-maturation. Also explained the surgery is not done [facility] and that he would need to have surgery at [facility].  Will review his vein map for suggestions regarding initial access. Deferred to nephrology regarding timing. If he needs surgery during this admission will [facility].    Quinlynn Cuthbert [facility] Office: [phone]

## 2014-07-06 NOTE — ED Provider Notes (Signed)
CSN: [id number]     Arrival date & time 07/06/14  1339 History   First MD Initiated Contact with Patient 07/06/14 1358     Chief Complaint  Patient presents with  . orthostatic hypotension       HPI Patient has a history of renal insufficiency and is being followed by nephrology.  He had scheduled dialysis graft placed by Dr. early today in the operating room.  He was sent home from the PACU at approximately noon.  On arrival to home the patient had generalized weakness trying to get out of the car and became lightheaded.  His wife reports "his left arm got tangled up in his walker".  He states he has left shoulder pain at this time.  Looking through the notes the patient is a long-standing history of chronic orthostasis but he feels as though this has been worse.  He's had slight nausea over the past 4 or 5 days without vomiting.  He denies abdominal pain.  He denies chest pain or shortness of breath.  No palpitations.  He reports decreased oral intake over the past several days and he has been n.p.o. since midnight for his surgery today.  He has not eaten or drank anything since his surgery.  He denies abdominal discomfort at this time.  He is asymptomatic when he lies flat except for his mild to moderate left shoulder pain which is worse with range of motion of his left shoulder.  No head injury.  No neck pain.  No significant fall.   Past Medical History  Diagnosis Date  . CKD III-IV     a. since transplant in 2001 with significant worsening since 06/2013.  Marland Kitchen Hypertension     a. Long h/o HTN, came off of all meds following transplant/wt loss-->meds resumed 06/2013, difficult to control since.  Marland Kitchen History of stroke   . Hypercalcemia     a. 06/2013 Hospitalized in [place].  Marland Kitchen Heart transplanted     a. 1995 CABG x 4 in [place], [place];  b. 2001 Developed CHF;  c. 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 10/2000 Cardiac transplant @ [facility], [place];  e. Historically nl Bx and stress testing.  Last stress  test ~ 5 yrs ago.  Marland Kitchen Hernia of abdominal wall   . Hyperlipidemia   . Orthostatic hypotension     a. Has tried support hose and compression stockings - prefers not to wear.  . Coronary artery disease   . Myocardial infarction     PRIOR TO HEART TRANSPLANT  . Shortness of breath     EXERTION  . Orthostatic dizziness   . Stroke 06/2013    NO RESIDUAL - WAS CONFUSED AT TIME - THAT HAS RESOLVED  . Diabetes mellitus without complication     FASTING CBG 90S  . Headache(784.0)   . Cancer     SKIN  . Anemia   . History of blood transfusion    Past Surgical History  Procedure Laterality Date  . Cardiac surgery    . Heart transplant    . Cataract extraction    . Vasectomy    . Skin graft full thickness leg      cat scratch that did not heal  . Coronary artery bypass graft    . Eye surgery Bilateral     CATARACTS   Family History  Problem Relation Age of Onset  . Heart attack Father     died @ 45  . Heart attack Mother  History  Substance Use Topics  . Smoking status: Never Smoker   . Smokeless tobacco: Not on file  . Alcohol Use: No    Review of Systems  All other systems reviewed and are negative.     Allergies  Norvasc  Home Medications   Prior to Admission medications   Medication Sig Start Date End Date Taking? Authorizing Provider  acetaminophen (TYLENOL) 500 MG tablet Take 1,000 mg by mouth every 6 (six) hours as needed for mild pain.   Yes Historical Provider, MD  aspirin 325 MG tablet Take 325 mg by mouth at bedtime.   Yes Historical Provider, MD  calcitRIOL (ROCALTROL) 0.25 MCG capsule Take 0.25 mcg by mouth every Monday, Wednesday, and Friday.   Yes Historical Provider, MD  cycloSPORINE modified (NEORAL) 50 MG capsule Take 50-100 mg by mouth 2 (two) times daily. *takes 100mg  in the morning and 50mg  at bedtime*   Yes Historical Provider, MD  ferrous sulfate 325 (65 FE) MG tablet Take 325 mg by mouth 3 (three) times daily with meals.   Yes Historical  Provider, MD  FLUoxetine (PROZAC) 20 MG capsule Take 20 mg by mouth daily with breakfast.   Yes Historical Provider, MD  furosemide (LASIX) 40 MG tablet Take 40 mg by mouth 2 (two) times daily.   Yes Historical Provider, MD  labetalol (NORMODYNE) 100 MG tablet Take 100 mg by mouth 3 (three) times daily.   Yes Historical Provider, MD  lansoprazole (PREVACID) 15 MG capsule Take 30 mg by mouth 2 (two) times daily.   Yes Historical Provider, MD  levothyroxine (SYNTHROID, LEVOTHROID) 75 MCG tablet Take 75 mcg by mouth daily before breakfast.   Yes Historical Provider, MD  mycophenolate (CELLCEPT) 250 MG capsule Take 250-500 mg by mouth 2 (two) times daily. Takes 500mg  in the morning and 250mg  in the evening   Yes Historical Provider, MD  oxyCODONE (ROXICODONE) 5 MG immediate release tablet Take 1 tablet (5 mg total) by mouth every 6 (six) hours as needed for severe pain. 07/06/14  Yes Alvia Grove, PA-C  pravastatin (PRAVACHOL) 10 MG tablet Take 10 mg by mouth daily with breakfast.   Yes Historical Provider, MD  saccharomyces boulardii (FLORASTOR) 250 MG capsule Take 250 mg by mouth every other day.   Yes Historical Provider, MD  sodium bicarbonate 650 MG tablet Take 650 mg by mouth 3 (three) times daily.   Yes Historical Provider, MD  vitamin B-12 (CYANOCOBALAMIN) 1000 MCG tablet Take 1,000 mcg by mouth daily with breakfast.   Yes Historical Provider, MD   BP 214/76  Pulse 68  Temp(Src) 97.7 F (36.5 C) (Oral)  Resp 16  Ht 6\' 2"  (1.88 m)  Wt 137 lb (62.143 kg)  BMI 17.58 kg/m2  SpO2 100% Physical Exam  Nursing note and vitals reviewed. Constitutional: He is oriented to person, place, and time. He appears well-developed and well-nourished.  HENT:  Head: Normocephalic and atraumatic.  Eyes: EOM are normal.  Neck: Normal range of motion.  Cardiovascular: Normal rate, regular rhythm, normal heart sounds and intact distal pulses.   Pulmonary/Chest: Effort normal and breath sounds normal. No  respiratory distress.  Abdominal: Soft. He exhibits no distension. There is no tenderness.  Musculoskeletal: Normal range of motion.  Normal left radial pulse.  Good thrill in the left fistula.  No expanding hematoma of his left antecubital space.  Mild pain with range of motion of his left shoulder.  No obvious left shoulder deformity.  No tenderness at the  left a.c. joint.  Neurological: He is alert and oriented to person, place, and time.  Skin: Skin is warm and dry.  Psychiatric: He has a normal mood and affect. Judgment normal.    ED Course  Procedures (including critical care time) Labs Review Labs Reviewed  CBC - Abnormal; Notable for the following:    RBC 3.40 (*)    Hemoglobin 10.3 (*)    HCT 32.1 (*)    All other components within normal limits  BASIC METABOLIC PANEL - Abnormal; Notable for the following:    Potassium 3.4 (*)    Glucose, Bld 185 (*)    BUN 78 (*)    Creatinine, Ser 6.39 (*)    GFR calc non Af Amer 8 (*)    GFR calc Af Amer 9 (*)    Anion gap 19 (*)    All other components within normal limits  PHOSPHORUS - Abnormal; Notable for the following:    Phosphorus 5.0 (*)    All other components within normal limits  CBG MONITORING, ED - Abnormal; Notable for the following:    Glucose-Capillary 211 (*)    All other components within normal limits  TROPONIN I  MAGNESIUM   BUN  Date Value Ref Range Status  07/06/2014 78* 6 - 23 mg/dL Final  06/25/2014 65* 6 - 23 mg/dL Final  06/24/2014 62* 6 - 23 mg/dL Final  06/23/2014 61* 6 - 23 mg/dL Final   Creatinine, Ser  Date Value Ref Range Status  07/06/2014 6.39* 0.50 - 1.35 mg/dL Final  06/25/2014 6.39* 0.50 - 1.35 mg/dL Final  06/24/2014 5.86* 0.50 - 1.35 mg/dL Final  06/23/2014 5.66* 0.50 - 1.35 mg/dL Final      Imaging Review No results found.   EKG Interpretation None      MDM   Final diagnoses:  None    I suspect the patient is volume depleted given his decreased oral intake over the past several  days as well as his n.p.o. status since midnight.  He feels better after 1 L of fluid but when I stood him up at the bedside he continues to feel somewhat lightheaded this time.  He was given an additional 1 L of IV fluids at this time.  Prior to the initial fluid bolus he had not urinated since 3:30 in the morning.  He has since given [place] a urine sample that is concentrated.  I suspect this is all dehydration.  Regarding his left shoulder he will undergo x-ray.  His x-rays normal he will need to followup with orthopedic surgery to evaluate for possible internal derangement.  Renal insufficiency otherwise baseline for the patient with mild elevation of his BUN which likely suggests his prerenal state. Care to Dr Christy Gentles to follow up xray and his response to IVF bolus x 2.     Hoy Morn, MD 07/06/14 [phone]

## 2014-07-06 NOTE — ED Notes (Signed)
Attempted report 

## 2014-07-06 NOTE — ED Notes (Signed)
Dr Nichola Sizer wants to evaluate to determine need for step down bed secondary to BP in the 200's.

## 2014-07-06 NOTE — Telephone Encounter (Signed)
Phone #s are busy/disconnected. Mailed appointment letter to home address, dpm

## 2014-07-06 NOTE — ED Notes (Signed)
Dr. Campos at bedside   

## 2014-07-06 NOTE — Transfer of Care (Signed)
Immediate Anesthesia Transfer of Care Note  Patient: [name] [name]  Procedure(s) Performed: Procedure(s): BASCILIC VEIN TRANSPOSITION (Left)  Patient Location: PACU  Anesthesia Type:MAC  Level of Consciousness: awake, alert  and oriented  Airway & Oxygen Therapy: Patient Spontanous Breathing and Patient connected to nasal cannula oxygen  Post-op Assessment: Report given to PACU RN, Post -op Vital signs reviewed and stable and Patient moving all extremities X 4  Post vital signs: Reviewed and stable  Complications: No apparent anesthesia complications

## 2014-07-06 NOTE — ED Notes (Signed)
Pt still at X-ray 

## 2014-07-06 NOTE — ED Provider Notes (Signed)
EKG Interpretation  Date/Time:  Monday July 06 2014 14:02:58 EDT Ventricular Rate:  70 PR Interval:  123 QRS Duration: 124 QT Interval:  484 QTC Calculation: 522 R Axis:   82 Text Interpretation:  Sinus rhythm Right bundle branch block No significant change was found Confirmed by CAMPOS  MD, Lennette Bihari ([id number]) on 07/06/2014 4:38:20 PM       Pt found to have left prox humerus fx He is still orthostatic - upon standing his BP dropped to 70s This corrects upon sitting Will need admission Sling ordered for left UE D/w dr Hal Hope, will admit to tele OBS   Sharyon Cable, MD 07/06/14 2003

## 2014-07-06 NOTE — Interval H&P Note (Signed)
History and Physical Interval Note:  07/06/2014 7:06 AM  [name] [name]  has presented today for surgery, with the diagnosis of End stage renal disease   The various methods of treatment have been discussed with the patient and family. After consideration of risks, benefits and other options for treatment, the patient has consented to  Procedure(s): [place] (Left) as a surgical intervention .  The patient's history has been reviewed, patient examined, no change in status, stable for surgery.  I have reviewed the patient's chart and labs.  Questions were answered to the patient's satisfaction.     Loudon Krakow

## 2014-07-06 NOTE — ED Notes (Signed)
Pt was just discharged home after having dialysis graft placed in L arm today.  Pt was attempting to get out of car and into wheelchair and he fell d/t weakness and lightheadedness.  EMS stated ekg was unremarkable.  Pt ao x 4.  EMS stated pt with orthostatic hypotension (laying bp of 200/91 - standing bp of 81/palp).  Pt c/o L shoulder pain d/t straining L shoulder to get up off of the ground.  Denies chest pain or sob, though c/o nausea.

## 2014-07-06 NOTE — OR Nursing (Signed)
Pt with BP 189/72.  No c/o pain.  Resting BP in 180s.  Pt. With known significant orthostatic hypotension.  MDA aware.  No new orders.

## 2014-07-06 NOTE — ED Notes (Signed)
Dr Christy Gentles stated it is ok for pt to take his personal anti-rejection meds once verified by pharmacy.  Pt took 25 mg cyclosporine and 250 mg cellcept.

## 2014-07-06 NOTE — H&P (Signed)
[facility] History and Physical  [name] [name] ZOX:[id number] DOB: [birth year] DOA: 07/06/2014  Referring physician: ER physician. PCP: No PCP Per Patient   Chief Complaint: Dizziness.  HPI: [name] [name] is a 73 y.o. male with history of cardiac transplant, chronic kidney disease stage V who had left AV fistula placed today and was discharged home felt dizzy after reaching home. Patient walked with help of a walker and by the time he reached the bed he fell on the floor. Did not hit his head or lose consciousness but hurt his left shoulder. In the ER patient was from be profoundly hypotensive on standing up with orthostatic changes. Initially was given 1 L normal saline bolus and patient was given orthostatic another one monitor was given by the ED physician. Patient's x-ray show left humerus fracture. Patient at this time has been admitted for observation. Patient was recently admitted for malignant hypertension another time since patient also had orthostatic changes cardiology was consulted and 2-D echo done showed normal LV function with EF of 81-19% grade 2 diastolic dysfunction(transplanted heart). Patient otherwise denies any headache visual symptoms weakness of the upper lower extremities or any focal deficits denies any nausea vomiting abdominal pain diarrhea fever chills shortness of breath or productive cough.   Review of Systems: As presented in the history of presenting illness, rest negative.  Past Medical History  Diagnosis Date  . CKD III-IV     a. since transplant in 2001 with significant worsening since 06/2013.  Marland Kitchen Hypertension     a. Long h/o HTN, came off of all meds following transplant/wt loss-->meds resumed 06/2013, difficult to control since.  Marland Kitchen History of stroke   . Hypercalcemia     a. 06/2013 Hospitalized in [place].  Marland Kitchen Heart transplanted     a. 1995 CABG x 4 in [place], [place];  b. 2001 Developed CHF;  c. 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 10/2000 Cardiac transplant @  [facility], [place];  e. Historically nl Bx and stress testing.  Last stress test ~ 5 yrs ago.  Marland Kitchen Hernia of abdominal wall   . Hyperlipidemia   . Orthostatic hypotension     a. Has tried support hose and compression stockings - prefers not to wear.  . Coronary artery disease   . Myocardial infarction     PRIOR TO HEART TRANSPLANT  . Shortness of breath     EXERTION  . Orthostatic dizziness   . Stroke 06/2013    NO RESIDUAL - WAS CONFUSED AT TIME - THAT HAS RESOLVED  . Diabetes mellitus without complication     FASTING CBG 90S  . Headache(784.0)   . Cancer     SKIN  . Anemia   . History of blood transfusion    Past Surgical History  Procedure Laterality Date  . Cardiac surgery    . Heart transplant    . Cataract extraction    . Vasectomy    . Skin graft full thickness leg      cat scratch that did not heal  . Coronary artery bypass graft    . Eye surgery Bilateral     CATARACTS   Social History:  reports that he has never smoked. He does not have any smokeless tobacco history on file. He reports that he does not drink alcohol or use illicit drugs. Where does patient live home. Can patient participate in ADLs? Yes.  Allergies  Allergen Reactions  . Norvasc [Amlodipine Besylate] Swelling    Swelling of the lower  body     Family History:  Family History  Problem Relation Age of Onset  . Heart attack Father     died @ 15  . Heart attack Mother       Prior to Admission medications   Medication Sig Start Date End Date Taking? Authorizing Provider  acetaminophen (TYLENOL) 500 MG tablet Take 1,000 mg by mouth every 6 (six) hours as needed for mild pain.   Yes Historical Provider, MD  aspirin 325 MG tablet Take 325 mg by mouth at bedtime.   Yes Historical Provider, MD  calcitRIOL (ROCALTROL) 0.25 MCG capsule Take 0.25 mcg by mouth every Monday, Wednesday, and Friday.   Yes Historical Provider, MD  cycloSPORINE modified (NEORAL) 25 MG capsule Take 25-50 mg by  mouth 2 (two) times daily. 50 mg in the morning and 25 mg at night   Yes Historical Provider, MD  ferrous sulfate 325 (65 FE) MG tablet Take 325 mg by mouth 3 (three) times daily with meals.   Yes Historical Provider, MD  FLUoxetine (PROZAC) 20 MG capsule Take 20 mg by mouth daily with breakfast.   Yes Historical Provider, MD  furosemide (LASIX) 40 MG tablet Take 40 mg by mouth 2 (two) times daily.   Yes Historical Provider, MD  labetalol (NORMODYNE) 100 MG tablet Take 100 mg by mouth 3 (three) times daily.   Yes Historical Provider, MD  lansoprazole (PREVACID) 15 MG capsule Take 30 mg by mouth 2 (two) times daily.   Yes Historical Provider, MD  levothyroxine (SYNTHROID, LEVOTHROID) 75 MCG tablet Take 75 mcg by mouth daily before breakfast.   Yes Historical Provider, MD  mycophenolate (CELLCEPT) 250 MG capsule Take 250-500 mg by mouth 2 (two) times daily. Takes 500mg  in the morning and 250mg  in the evening   Yes Historical Provider, MD  oxyCODONE (ROXICODONE) 5 MG immediate release tablet Take 1 tablet (5 mg total) by mouth every 6 (six) hours as needed for severe pain. 07/06/14  Yes Alvia Grove, PA-C  pravastatin (PRAVACHOL) 10 MG tablet Take 10 mg by mouth daily with breakfast.   Yes Historical Provider, MD  saccharomyces boulardii (FLORASTOR) 250 MG capsule Take 250 mg by mouth every other day.   Yes Historical Provider, MD  sodium bicarbonate 650 MG tablet Take 650 mg by mouth 3 (three) times daily.   Yes Historical Provider, MD  vitamin B-12 (CYANOCOBALAMIN) 1000 MCG tablet Take 1,000 mcg by mouth daily with breakfast.   Yes Historical Provider, MD  ondansetron (ZOFRAN ODT) 8 MG disintegrating tablet Take 1 tablet (8 mg total) by mouth every 8 (eight) hours as needed for nausea or vomiting. 07/06/14   Hoy Morn, MD    Physical Exam: Filed Vitals:   07/06/14 2000 07/06/14 2015 07/06/14 2030 07/06/14 2100  BP: 191/73 207/77 194/86 172/70  Pulse: 80 81 81 82  Temp:      TempSrc:       Resp: 13 18 20 18   Height:      Weight:      SpO2: 100% 100% 100% 100%     General:  Moderately built and nourished.  Eyes: Anicteric no pallor.  ENT: No discharge from ears eyes nose mouth.  Neck: No mass felt.  Cardiovascular: S1-S2 heard.  Respiratory: No rhonchi or crepitations.  Abdomen: Soft nontender bowel sounds present. No guarding rigidity.  Skin: Chronic skin changes.  Musculoskeletal: Left upper extremity is in sling due to the fracture.  Psychiatric: Appears normal.  Neurologic: Alert  awake oriented to time place and person. Moves all extremities 5 x 5. Left upper extremity is in a sling. No facial asymmetry. Tongue is midline.  Labs on Admission:  Basic Metabolic Panel:  Recent Labs Lab 07/06/14 0557 07/06/14 1424 07/06/14 1541  NA 138 139  --   K 3.7 3.4*  --   CL  --  98  --   CO2  --  22  --   GLUCOSE 134* 185*  --   BUN  --  78*  --   CREATININE  --  6.39*  --   CALCIUM  --  8.8  --   MG  --   --  1.6  PHOS  --   --  5.0*   Liver Function Tests: No results found for this basename: AST, ALT, ALKPHOS, BILITOT, PROT, ALBUMIN,  in the last 168 hours No results found for this basename: LIPASE, AMYLASE,  in the last 168 hours No results found for this basename: AMMONIA,  in the last 168 hours CBC:  Recent Labs Lab 07/06/14 0557 07/06/14 1424  WBC  --  10.4  HGB 11.9* 10.3*  HCT 35.0* 32.1*  MCV  --  94.4  PLT  --  270   Cardiac Enzymes:  Recent Labs Lab 07/06/14 1424  TROPONINI <0.30    BNP (last 3 results) No results found for this basename: PROBNP,  in the last 8760 hours CBG:  Recent Labs Lab 07/06/14 0934 07/06/14 1412  GLUCAP 117* 211*    Radiological Exams on Admission: Dg Shoulder Left  07/06/2014   CLINICAL DATA:  Anterior and lateral shoulder pain.  Fall.  EXAM: LEFT SHOULDER - 2+ VIEW  COMPARISON:  None.  FINDINGS: Humeral head is located. The shoulder is internally rotated and there is a nondisplaced  fracture of the inferior aspect of the greater tuberosity. Moderate AC joint osteoarthritis. Scapula appears intact. Surgical clips are present in the upper arm.  There appears to be a transverse nondisplaced fracture plane through the proximal LEFT humerus. The fracture fragments are not displaced and the radiographic appearance is most compatible with a 1 part fracture.  IMPRESSION: Nondisplaced fracture of the proximal humerus. Noncontrast CT recommended for further characterization because the arm is internally rotated on these radiographs.   Electronically Signed   By: Dereck Ligas M.D.   On: 07/06/2014 18:21    EKG: Independently reviewed. No sinus rhythm with RBBB.  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 Near-syncope - with orthostatic changes. Cardiology had recommended last admission 2 weeks ago compression stockings and abdominal binder. Check orthostatics in a.m. and if patient is able to ambulate may be discharged home in a.m. 2. Left humerus fracture status fall - Will need orthopedic consult as outpatient. 3. Chronic kidney disease stage 5 status post left AV fistula placement - patient did receive 2 L normal saline in the ER. Closely follow respiratory status. Continue present medication. Patient is on Lasix. 4. Cardiac transplant - continue immunosuppressants. 5. Diabetes mellitus2 - sliding scale coverage. 6. Hypertension - continue home medications. 7. Hypothyroidism - continue Synthroid. 8. Chronic anemia - follow CBC.    Code Status: Full code.  Family Communication: None.  Disposition Plan: Admit for observation.    Magaret Justo N. [facility] Pager [phone].  If  7PM-7AM, please contact night-coverage [email or web address] Password  [facility] 07/06/2014, 9:26 PM

## 2014-07-07 DIAGNOSIS — I951 Orthostatic hypotension: Secondary | ICD-10-CM | POA: Diagnosis not present

## 2014-07-07 DIAGNOSIS — N185 Chronic kidney disease, stage 5: Secondary | ICD-10-CM

## 2014-07-07 DIAGNOSIS — N184 Chronic kidney disease, stage 4 (severe): Secondary | ICD-10-CM

## 2014-07-07 DIAGNOSIS — S42309A Unspecified fracture of shaft of humerus, unspecified arm, initial encounter for closed fracture: Secondary | ICD-10-CM

## 2014-07-07 DIAGNOSIS — I10 Essential (primary) hypertension: Secondary | ICD-10-CM

## 2014-07-07 LAB — BASIC METABOLIC PANEL
Anion gap: 17 — ABNORMAL HIGH (ref 5–15)
BUN: 76 mg/dL — AB (ref 6–23)
CO2: 21 mEq/L (ref 19–32)
Calcium: 8.3 mg/dL — ABNORMAL LOW (ref 8.4–10.5)
Chloride: 104 mEq/L (ref 96–112)
Creatinine, Ser: 6.05 mg/dL — ABNORMAL HIGH (ref 0.50–1.35)
GFR calc Af Amer: 10 mL/min — ABNORMAL LOW (ref >90)
GFR, EST NON AFRICAN AMERICAN: 8 mL/min — AB (ref >90)
GLUCOSE: 94 mg/dL (ref 70–99)
POTASSIUM: 3.7 meq/L (ref 3.7–5.3)
Sodium: 142 mEq/L (ref 137–147)

## 2014-07-07 LAB — CBC
HEMATOCRIT: 26.8 % — AB (ref 39.0–52.0)
Hemoglobin: 8.6 g/dL — ABNORMAL LOW (ref 13.0–17.0)
MCH: 30.8 pg (ref 26.0–34.0)
MCHC: 32.1 g/dL (ref 30.0–36.0)
MCV: 96.1 fL (ref 78.0–100.0)
Platelets: 237 10*3/uL (ref 150–400)
RBC: 2.79 MIL/uL — AB (ref 4.22–5.81)
RDW: 14.8 % (ref 11.5–15.5)
WBC: 5.8 10*3/uL (ref 4.0–10.5)

## 2014-07-07 LAB — GLUCOSE, CAPILLARY
GLUCOSE-CAPILLARY: 150 mg/dL — AB (ref 70–99)
Glucose-Capillary: 102 mg/dL — ABNORMAL HIGH (ref 70–99)
Glucose-Capillary: 152 mg/dL — ABNORMAL HIGH (ref 70–99)
Glucose-Capillary: 135 mg/dL — ABNORMAL HIGH (ref 70–99)

## 2014-07-07 MED ORDER — HEPARIN SODIUM (PORCINE) 1000 UNIT/ML IJ SOLN
5000.0000 [IU] | Freq: Three times a day (TID) | INTRAMUSCULAR | Status: DC
  Filled 2014-07-07 (×3): qty 5

## 2014-07-07 MED ORDER — NEPRO/CARBSTEADY PO LIQD
237.0000 mL | Freq: Two times a day (BID) | ORAL | Status: DC
  Administered 2014-07-07 – 2014-07-10 (×6): 237 mL via ORAL

## 2014-07-07 NOTE — Care Management Note (Addendum)
    Page 1 of 2   07/10/2014     1:46:56 PM CARE MANAGEMENT NOTE 07/10/2014  Patient:  [name] [name]   Account Number:  [id number]  Date Initiated:  07/07/2014  Documentation initiated by:  Tomi Bamberger  Subjective/Objective Assessment:   dx htn, hx of heart txpt, ckd, ortho hypotension, syncope, non displaced humerus fx  admit- lives with spouse.     Action/Plan:   pt eval- rec hhpt   Anticipated DC Date:  07/10/2014   Anticipated DC Plan:  [place]  CM consult      Choice offered to / List presented to:  C-3 Spouse   DME arranged  [facility]      DME agency  [place] arranged  [place].   Status of service:  Completed, signed off Medicare Important Message given?  NO (If response is "NO", the following Medicare IM given date fields will be blank) Date Medicare IM given:   Medicare IM given by:   Date Additional Medicare IM given:   Additional Medicare IM given by:    Discharge Disposition:  [place]  Per UR Regulation:  Reviewed for med. necessity/level of care/duration of stay  If discussed at [place] of Stay Meetings, dates discussed:    Comments:  07/10/14 [place] RN,BSN [phone] patient dc to home today, [facility] notified, per staff RN patient wife states she does not want a w/ chair.  NCM put private duty list in patient's bag with his clothes.  07/09/14 [place], BSN [phone] NCM spoke with spouse, she chose [facility] for HHPT, and 3 n 1. Referral given to [facility], Butch Penny notified.  Soc will begin 24-48 hrs post dc.  07/07/14 1659 Tomi Bamberger RN, BSN [phone] patient lives with spouse, NCM will continie to follow for dc needs.

## 2014-07-07 NOTE — Progress Notes (Signed)
INITIAL NUTRITION ASSESSMENT  DOCUMENTATION CODES Per approved criteria  -Not Applicable   INTERVENTION: - Nepro Shake po BID, each supplement provides 425 kcal and 19 grams protein - Provided pt and wife with recipes and resources for a renal diet. Answered questions.   NUTRITION DIAGNOSIS: Inadequate oral intake related to CKD as evidenced by reported intake less than estimated needs and suspected weight loss.   Goal: Pt to meet >/= 90% of their estimated nutrition needs   Monitor:  Weight trend, po intake, labs  Reason for Assessment: MST  73 y.o. male  Admitting Dx: Near syncope  ASSESSMENT: 73 y.o. male with history of cardiac transplant, chronic kidney disease stage V who had left AV fistula placed today and was discharged home felt dizzy after reaching home.   - Spoke with pt and wife in room. Pt said that his intake has been poor because he does not like foods compliant with a renal diet, which he has been following. Suspect some form of weight loss, but unknown usual body weight.  - [name] said that she was having trouble with meal planning and asked for recipes. RD provided pt and wife with resources including recipes, a website with renal recipes, and the names of several cookbooks they could purchase at a bookstore or online. - Pt with moderate fat and muscle wasting of the clavicle and acromion.   Labs: Na and K WNL Phos: elevated  Height: Ht Readings from Last 1 Encounters:  07/06/14 6\' 2"  (1.88 m)    Weight: Wt Readings from Last 1 Encounters:  07/07/14 150 lb 5.7 oz (68.2 kg)    Ideal Body Weight: 82.2 kg  % Ideal Body Weight: 83%  Wt Readings from Last 10 Encounters:  07/07/14 150 lb 5.7 oz (68.2 kg)  07/02/14 146 lb 14.4 oz (66.633 kg)  06/25/14 158 lb 6.4 oz (71.85 kg)    Usual Body Weight: 155 lbs  % Usual Body Weight: 97%  BMI:  Body mass index is 19.3 kg/(m^2).  Estimated Nutritional Needs: Kcal: 1850-2150 Protein: 120-130  g Fluid: 1200 ml fluid restriction  Skin: incision on left arm  Diet Order: Diabetic  EDUCATION NEEDS: -Education needs addressed   Intake/Output Summary (Last 24 hours) at 07/07/14 1449 Last data filed at 07/06/14 2222  Gross per 24 hour  Intake   1462 ml  Output    575 ml  Net    887 ml    Last BM: prior to admission   Labs:   Recent Labs Lab 07/06/14 0557 07/06/14 1424 07/06/14 1541 07/07/14 0500  NA 138 139  --  142  K 3.7 3.4*  --  3.7  CL  --  98  --  104  CO2  --  22  --  21  BUN  --  78*  --  76*  CREATININE  --  6.39*  --  6.05*  CALCIUM  --  8.8  --  8.3*  MG  --   --  1.6  --   PHOS  --   --  5.0*  --   GLUCOSE 134* 185*  --  94    CBG (last 3)   Recent Labs  07/06/14 2221 07/07/14 0745 07/07/14 1142  GLUCAP 205* 102* 152*    Scheduled Meds: . aspirin  325 mg Oral QHS  . calcitRIOL  0.25 mcg Oral Q M,W,F  . cycloSPORINE modified  25 mg Oral QHS  . cycloSPORINE modified  50 mg Oral  Daily  . ferrous sulfate  325 mg Oral TID WC  . FLUoxetine  20 mg Oral Q breakfast  . [START ON 07/08/2014] heparin  5,000 Units Intravenous TID  . insulin aspart  0-9 Units Subcutaneous TID WC  . labetalol  100 mg Oral TID  . levothyroxine  75 mcg Oral QAC breakfast  . mycophenolate  250 mg Oral QHS  . mycophenolate  500 mg Oral Daily  . pantoprazole  40 mg Oral Daily  . saccharomyces boulardii  250 mg Oral QODAY  . simvastatin  5 mg Oral q1800  . sodium bicarbonate  650 mg Oral TID  . sodium chloride  3 mL Intravenous Q12H  . vitamin B-12  1,000 mcg Oral Q breakfast    Continuous Infusions:   Past Medical History  Diagnosis Date  . CKD III-IV     a. since transplant in 2001 with significant worsening since 06/2013.  Marland Kitchen Hypertension     a. Long h/o HTN, came off of all meds following transplant/wt loss-->meds resumed 06/2013, difficult to control since.  Marland Kitchen History of stroke   . Hypercalcemia     a. 06/2013 Hospitalized in [place].  Marland Kitchen Heart transplanted      a. 1995 CABG x 4 in [place], [place];  b. 2001 Developed CHF;  c. 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 10/2000 Cardiac transplant @ [facility], [place];  e. Historically nl Bx and stress testing.  Last stress test ~ 5 yrs ago.  Marland Kitchen Hernia of abdominal wall   . Hyperlipidemia   . Orthostatic hypotension     a. Has tried support hose and compression stockings - prefers not to wear.  . Coronary artery disease   . Myocardial infarction     PRIOR TO HEART TRANSPLANT  . Shortness of breath     EXERTION  . Orthostatic dizziness   . Stroke 06/2013    NO RESIDUAL - WAS CONFUSED AT TIME - THAT HAS RESOLVED  . Diabetes mellitus without complication     FASTING CBG 90S  . Headache(784.0)   . Cancer     SKIN  . Anemia   . History of blood transfusion     Past Surgical History  Procedure Laterality Date  . Cardiac surgery    . Heart transplant    . Cataract extraction    . Vasectomy    . Skin graft full thickness leg      cat scratch that did not heal  . Coronary artery bypass graft    . Eye surgery Bilateral     CATARACTS    Terrace Arabia RD, LDN

## 2014-07-07 NOTE — Consult Note (Signed)
Reason for Consult:  Left proximal humerus fracture Referring Physician:   [facility]  [name] [name] is an 73 y.o. male.  HPI:   73 yo male with a history of generalized weakness and a recent fall.  He injured his left shoulder and was found to have a non-displaced left proximal humerus fracture.  He was placed in a sling appropriately.  He does report moderate left shoulder pain.  He has had a recent fistula placed in his left arm for dialysis access.  Past Medical History  Diagnosis Date  . CKD III-IV     a. since transplant in 2001 with significant worsening since 06/2013.  Marland Kitchen Hypertension     a. Long h/o HTN, came off of all meds following transplant/wt loss-->meds resumed 06/2013, difficult to control since.  Marland Kitchen History of stroke   . Hypercalcemia     a. 06/2013 Hospitalized in [place].  Marland Kitchen Heart transplanted     a. 1995 CABG x 4 in [place], [place];  b. 2001 Developed CHF;  c. 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 10/2000 Cardiac transplant @ [facility], [place];  e. Historically nl Bx and stress testing.  Last stress test ~ 5 yrs ago.  Marland Kitchen Hernia of abdominal wall   . Hyperlipidemia   . Orthostatic hypotension     a. Has tried support hose and compression stockings - prefers not to wear.  . Coronary artery disease   . Myocardial infarction     PRIOR TO HEART TRANSPLANT  . Shortness of breath     EXERTION  . Orthostatic dizziness   . Stroke 06/2013    NO RESIDUAL - WAS CONFUSED AT TIME - THAT HAS RESOLVED  . Diabetes mellitus without complication     FASTING CBG 90S  . Headache(784.0)   . Cancer     SKIN  . Anemia   . History of blood transfusion     Past Surgical History  Procedure Laterality Date  . Cardiac surgery    . Heart transplant    . Cataract extraction    . Vasectomy    . Skin graft full thickness leg      cat scratch that did not heal  . Coronary artery bypass graft    . Eye surgery Bilateral     CATARACTS    Family History  Problem Relation Age of Onset   . Heart attack Father     died @ 56  . Heart attack Mother     Social History:  reports that he has never smoked. He does not have any smokeless tobacco history on file. He reports that he does not drink alcohol or use illicit drugs.  Allergies:  Allergies  Allergen Reactions  . Norvasc [Amlodipine Besylate] Swelling    Swelling of the lower body     Medications: I have reviewed the patient's current medications.  Results for orders placed during the hospital encounter of 07/06/14 (from the past 48 hour(s))  CBG MONITORING, ED     Status: Abnormal   Collection Time    07/06/14  2:12 PM      Result Value Ref Range   Glucose-Capillary 211 (*) 70 - 99 mg/dL  CBC     Status: Abnormal   Collection Time    07/06/14  2:24 PM      Result Value Ref Range   WBC 10.4  4.0 - 10.5 K/uL   RBC 3.40 (*) 4.22 - 5.81 MIL/uL   Hemoglobin 10.3 (*) 13.0 -  17.0 g/dL   HCT 32.1 (*) 39.0 - 52.0 %   MCV 94.4  78.0 - 100.0 fL   MCH 30.3  26.0 - 34.0 pg   MCHC 32.1  30.0 - 36.0 g/dL   RDW 14.7  11.5 - 15.5 %   Platelets 270  150 - 400 K/uL  BASIC METABOLIC PANEL     Status: Abnormal   Collection Time    07/06/14  2:24 PM      Result Value Ref Range   Sodium 139  137 - 147 mEq/L   Potassium 3.4 (*) 3.7 - 5.3 mEq/L   Chloride 98  96 - 112 mEq/L   CO2 22  19 - 32 mEq/L   Glucose, Bld 185 (*) 70 - 99 mg/dL   BUN 78 (*) 6 - 23 mg/dL   Creatinine, Ser 6.39 (*) 0.50 - 1.35 mg/dL   Calcium 8.8  8.4 - 10.5 mg/dL   GFR calc non Af Amer 8 (*) >90 mL/min   GFR calc Af Amer 9 (*) >90 mL/min   Comment: (NOTE)     The eGFR has been calculated using the CKD EPI equation.     This calculation has not been validated in all clinical situations.     eGFR's persistently <90 mL/min signify possible Chronic Kidney     Disease.   Anion gap 19 (*) 5 - 15  TROPONIN I     Status: None   Collection Time    07/06/14  2:24 PM      Result Value Ref Range   Troponin I <0.30  <0.30 ng/mL   Comment:            Due  to the release kinetics of cTnI,     a negative result within the first hours     of the onset of symptoms does not rule out     myocardial infarction with certainty.     If myocardial infarction is still suspected,     repeat the test at appropriate intervals.  MAGNESIUM     Status: None   Collection Time    07/06/14  3:41 PM      Result Value Ref Range   Magnesium 1.6  1.5 - 2.5 mg/dL  PHOSPHORUS     Status: Abnormal   Collection Time    07/06/14  3:41 PM      Result Value Ref Range   Phosphorus 5.0 (*) 2.3 - 4.6 mg/dL  GLUCOSE, CAPILLARY     Status: Abnormal   Collection Time    07/06/14 10:21 PM      Result Value Ref Range   Glucose-Capillary 205 (*) 70 - 99 mg/dL  BASIC METABOLIC PANEL     Status: Abnormal   Collection Time    07/07/14  5:00 AM      Result Value Ref Range   Sodium 142  137 - 147 mEq/L   Potassium 3.7  3.7 - 5.3 mEq/L   Chloride 104  96 - 112 mEq/L   CO2 21  19 - 32 mEq/L   Glucose, Bld 94  70 - 99 mg/dL   BUN 76 (*) 6 - 23 mg/dL   Creatinine, Ser 6.05 (*) 0.50 - 1.35 mg/dL   Calcium 8.3 (*) 8.4 - 10.5 mg/dL   GFR calc non Af Amer 8 (*) >90 mL/min   GFR calc Af Amer 10 (*) >90 mL/min   Comment: (NOTE)     The eGFR has been calculated using  the CKD EPI equation.     This calculation has not been validated in all clinical situations.     eGFR's persistently <90 mL/min signify possible Chronic Kidney     Disease.   Anion gap 17 (*) 5 - 15  CBC     Status: Abnormal   Collection Time    07/07/14  5:00 AM      Result Value Ref Range   WBC 5.8  4.0 - 10.5 K/uL   RBC 2.79 (*) 4.22 - 5.81 MIL/uL   Hemoglobin 8.6 (*) 13.0 - 17.0 g/dL   HCT 26.8 (*) 39.0 - 52.0 %   MCV 96.1  78.0 - 100.0 fL   MCH 30.8  26.0 - 34.0 pg   MCHC 32.1  30.0 - 36.0 g/dL   RDW 14.8  11.5 - 15.5 %   Platelets 237  150 - 400 K/uL  GLUCOSE, CAPILLARY     Status: Abnormal   Collection Time    07/07/14  7:45 AM      Result Value Ref Range   Glucose-Capillary 102 (*) 70 - 99  mg/dL  GLUCOSE, CAPILLARY     Status: Abnormal   Collection Time    07/07/14 11:42 AM      Result Value Ref Range   Glucose-Capillary 152 (*) 70 - 99 mg/dL    Dg Chest Port 1 View  07/06/2014   CLINICAL DATA:  Chest pain.  EXAM: PORTABLE CHEST - 1 VIEW  COMPARISON:  07/02/2014.  FINDINGS: The cardiac silhouette, mediastinal and hilar contours are within normal limits and stable. Stable tortuosity and calcification of the thoracic aorta. Stable surgical changes from bypass surgery. Slightly low lung volumes with mild vascular crowding and streaky atelectasis. No infiltrates, edema or effusions. Chronic appearing right-sided pleural thickening. The bony thorax is intact.  IMPRESSION: Low lung volumes with vascular crowding and atelectasis.   Electronically Signed   By: Kalman Jewels M.D.   On: 07/06/2014 22:20   Dg Shoulder Left  07/06/2014   CLINICAL DATA:  Anterior and lateral shoulder pain.  Fall.  EXAM: LEFT SHOULDER - 2+ VIEW  COMPARISON:  None.  FINDINGS: Humeral head is located. The shoulder is internally rotated and there is a nondisplaced fracture of the inferior aspect of the greater tuberosity. Moderate AC joint osteoarthritis. Scapula appears intact. Surgical clips are present in the upper arm.  There appears to be a transverse nondisplaced fracture plane through the proximal LEFT humerus. The fracture fragments are not displaced and the radiographic appearance is most compatible with a 1 part fracture.  IMPRESSION: Nondisplaced fracture of the proximal humerus. Noncontrast CT recommended for further characterization because the arm is internally rotated on these radiographs.   Electronically Signed   By: Dereck Ligas M.D.   On: 07/06/2014 18:21    ROS Blood pressure 170/77, pulse 78, temperature 98.1 F (36.7 C), temperature source Oral, resp. rate 18, height 6' 2"  (1.88 m), weight 68.2 kg (150 lb 5.7 oz), SpO2 99.00%. Physical Exam  Musculoskeletal:       Left shoulder: He  exhibits decreased range of motion, bony tenderness, swelling and decreased strength.    Assessment/Plan: Left non-displaced proximal humerus fracture 1)  I spoke with [name] [name] in length and he understands fully his fracture and the closed/non-operative treatment that is needed for this fracture.  He should do quite well and can wear a sling for comfort.  He will avoid lifting and overhead activities with that arm for the next  6 weeks.  He can again, come out of the sling as comfort allows.  Mcarthur Rossetti 07/07/2014, 3:54 PM

## 2014-07-07 NOTE — Consult Note (Signed)
Reason for Consult:CKD5, Orthostatic Hypotension Referring Physician: Sloan Leiter, MD  [name] [name] is an 73 y.o. male.  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  He also has a history of NIDDM.  After moving to [place] he was told at the beginning of this month to seek care after his lab test in [facility] were abnormal (Increase of Scr from 3.9 to 5.10).  Nephrology was consulted at that time and felt that the rise in SCr was progression of his CKD and represented a small decrease in his GFR.  However his BP management was complicated by significant orthostatic hypotension.  He was evaluated by Dr. Donnetta Hutching in the hospital for vascular access but this was deferred to outpatient follow up. He was discharged from the hospital 06/25/14 and followed up at [facility] on 06/29/14 with Dr. Lorrene Reid.  He then underwent left upper arm fistula creation with Dr. Donnetta Hutching on 8/17.  After discharge from this procedure he felt dizzy and lightheaded and returned to the ED where he was again found to be profoundly orthostatic hypotensive he was given 2L of IVF and admitted for observation. Today he reports that he is feeling well as long as he is lying down in bed.  He and his wife note that nursing staff have tried to stand him a few times to take orthostatic vital signs but he has been unable to do so.  He reports that when he does stand he gets nauseous and has blurry vision.  He reports a mild HA currently that has been going on since arrival to the ED.  He denies any palpitations.  He denies any SOB while lying flat.    He notes his only home BP medication that he was taking PTA was labetalol 100mg  TID, and his last lasix dose was on Sunday 8/16.  His wife reports that he did not drink much fluids prior to his surgery and she feels that he was "dry" before the surgery although he reports he felt very  well the morning of the surgery and notes that he was able to stand up after the surgery without symptoms. Although 45min-1hr later was when he got home and had trouble getting out of wheelchair into house.  Trend in Creatinine: Creatinine, Ser  Date/Time Value Ref Range Status  07/07/2014  5:00 AM 6.05* 0.50 - 1.35 mg/dL Final  07/06/2014  2:24 PM 6.39* 0.50 - 1.35 mg/dL Final  06/25/2014  4:20 AM 6.39* 0.50 - 1.35 mg/dL Final  06/24/2014  3:10 AM 5.86* 0.50 - 1.35 mg/dL Final  06/23/2014  3:44 AM 5.66* 0.50 - 1.35 mg/dL Final  06/22/2014  3:40 AM 5.09* 0.50 - 1.35 mg/dL Final  06/21/2014  3:55 AM 4.91* 0.50 - 1.35 mg/dL Final  06/20/2014  5:39 PM 4.76* 0.50 - 1.35 mg/dL Final    PMH:   Past Medical History  Diagnosis Date  . CKD III-IV     a. since transplant in 2001 with significant worsening since 06/2013.  Marland Kitchen Hypertension     a. Long h/o HTN, came off of all meds following transplant/wt loss-->meds resumed 06/2013, difficult to control since.  Marland Kitchen History of stroke   . Hypercalcemia     a. 06/2013 Hospitalized in [place].  Marland Kitchen Heart transplanted     a. 1995 CABG x 4 in [place], [place];  b. 2001 Developed CHF;  c. 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 10/2000 Cardiac transplant @ [facility], [place];  e. Historically nl Bx and stress testing.  Last stress test ~ 5 yrs ago.  Marland Kitchen Hernia of abdominal wall   . Hyperlipidemia   . Orthostatic hypotension     a. Has tried support hose and compression stockings - prefers not to wear.  . Coronary artery disease   . Myocardial infarction     PRIOR TO HEART TRANSPLANT  . Shortness of breath     EXERTION  . Orthostatic dizziness   . Stroke 06/2013    NO RESIDUAL - WAS CONFUSED AT TIME - THAT HAS RESOLVED  . Diabetes mellitus without complication     FASTING CBG 90S  . Headache(784.0)   . Cancer     SKIN  . Anemia   . History of blood transfusion     PSH:   Past Surgical History  Procedure Laterality Date  . Cardiac surgery    . Heart transplant    .  Cataract extraction    . Vasectomy    . Skin graft full thickness leg      cat scratch that did not heal  . Coronary artery bypass graft    . Eye surgery Bilateral     CATARACTS    Allergies:  Allergies  Allergen Reactions  . Norvasc [Amlodipine Besylate] Swelling    Swelling of the lower body     Medications:   Prior to Admission medications   Medication Sig Start Date End Date Taking? Authorizing Provider  acetaminophen (TYLENOL) 500 MG tablet Take 1,000 mg by mouth every 6 (six) hours as needed for mild pain.   Yes Historical Provider, MD  aspirin 325 MG tablet Take 325 mg by mouth at bedtime.   Yes Historical Provider, MD  calcitRIOL (ROCALTROL) 0.25 MCG capsule Take 0.25 mcg by mouth every Monday, Wednesday, and Friday.   Yes Historical Provider, MD  cycloSPORINE modified (NEORAL) 25 MG capsule Take 25-50 mg by mouth 2 (two) times daily. 50 mg in the morning and 25 mg at night   Yes Historical Provider, MD  ferrous sulfate 325 (65 FE) MG tablet Take 325 mg by mouth 3 (three) times daily with meals.   Yes Historical Provider, MD  FLUoxetine (PROZAC) 20 MG capsule Take 20 mg by mouth daily with breakfast.   Yes Historical Provider, MD  furosemide (LASIX) 40 MG tablet Take 40 mg by mouth 2 (two) times daily.   Yes Historical Provider, MD  labetalol (NORMODYNE) 100 MG tablet Take 100 mg by mouth 3 (three) times daily.   Yes Historical Provider, MD  lansoprazole (PREVACID) 15 MG capsule Take 30 mg by mouth 2 (two) times daily.   Yes Historical Provider, MD  levothyroxine (SYNTHROID, LEVOTHROID) 75 MCG tablet Take 75 mcg by mouth daily before breakfast.   Yes Historical Provider, MD  mycophenolate (CELLCEPT) 250 MG capsule Take 250-500 mg by mouth 2 (two) times daily. Takes 500mg  in the morning and 250mg  in the evening   Yes Historical Provider, MD  oxyCODONE (ROXICODONE) 5 MG immediate release tablet Take 1 tablet (5 mg total) by mouth every 6 (six) hours as needed for severe pain.  07/06/14  Yes Alvia Grove, PA-C  pravastatin (PRAVACHOL) 10 MG tablet Take 10 mg by mouth daily with breakfast.   Yes Historical Provider, MD  saccharomyces boulardii (FLORASTOR) 250 MG capsule Take 250 mg by mouth every other day.   Yes Historical Provider, MD  sodium bicarbonate 650 MG tablet Take 650 mg by mouth 3 (three) times daily.   Yes Historical Provider, MD  vitamin B-12 (CYANOCOBALAMIN) 1000 MCG tablet Take 1,000 mcg by mouth daily with breakfast.   Yes Historical Provider, MD  ondansetron (ZOFRAN ODT) 8 MG disintegrating tablet Take 1 tablet (8 mg total) by mouth every 8 (eight) hours as needed for nausea or vomiting. 07/06/14   Hoy Morn, MD    Inpatient medications: . aspirin  325 mg Oral QHS  . calcitRIOL  0.25 mcg Oral Q M,W,F  . cycloSPORINE modified  25 mg Oral QHS  . cycloSPORINE modified  50 mg Oral Daily  . ferrous sulfate  325 mg Oral TID WC  . FLUoxetine  20 mg Oral Q breakfast  . furosemide  40 mg Oral BID  . [START ON 07/08/2014] heparin  5,000 Units Intravenous TID  . insulin aspart  0-9 Units Subcutaneous TID WC  . labetalol  100 mg Oral TID  . levothyroxine  75 mcg Oral QAC breakfast  . mycophenolate  250 mg Oral QHS  . mycophenolate  500 mg Oral Daily  . pantoprazole  40 mg Oral Daily  . saccharomyces boulardii  250 mg Oral QODAY  . simvastatin  5 mg Oral q1800  . sodium bicarbonate  650 mg Oral TID  . sodium chloride  3 mL Intravenous Q12H  . vitamin B-12  1,000 mcg Oral Q breakfast    Discontinued Meds:   Medications Discontinued During This Encounter  Medication Reason  . cycloSPORINE modified (NEORAL) 50 MG capsule Dose change  . sodium chloride 0.9 % injection 3 mL   . enoxaparin (LOVENOX) injection 30 mg     Social History:  reports that he has never smoked. He does not have any smokeless tobacco history on file. He reports that he does not drink alcohol or use illicit drugs.  Family History:   Family History  Problem Relation Age of  Onset  . Heart attack Father     died @ 61  . Heart attack Mother     Review of Systems  Constitutional: Positive for malaise/fatigue. Negative for fever, chills and diaphoresis.  HENT: Negative for nosebleeds.   Eyes: Positive for blurred vision (on standing).  Respiratory: Negative for cough and shortness of breath.        + DOE  Cardiovascular: Negative for chest pain, palpitations, orthopnea and leg swelling.  Gastrointestinal: Positive for nausea (on standing). Negative for heartburn, vomiting, abdominal pain and diarrhea.  Genitourinary: Negative for dysuria.  Musculoskeletal: Positive for falls (3 falls at home).  Neurological: Positive for dizziness (on standing), weakness and headaches.  Psychiatric/Behavioral: Negative for depression and substance abuse.    Weight change:   Intake/Output Summary (Last 24 hours) at 07/07/14 1126 Last data filed at 07/06/14 2222  Gross per 24 hour  Intake   1462 ml  Output    575 ml  Net    887 ml   BP 152/68  Pulse 86  Temp(Src) 98.3 F (36.8 C) (Oral)  Resp 18  Ht 6\' 2"  (1.88 m)  Wt 150 lb 5.7 oz (68.2 kg)  BMI 19.30 kg/m2  SpO2 99% Filed Vitals:   07/06/14 2321 07/07/14 0539 07/07/14 0630 07/07/14 1031  BP: 165/71 166/76  152/68  Pulse: 85 71  86  Temp:  98.3 F (36.8 C)    TempSrc:  Oral    Resp: 18 18    Height:      Weight:  150 lb 5.7 oz (68.2 kg)   SpO2:  99%       General: resting in bed, NAD HEENT: PERRL, no scleral icterus Cardiac: RRR, no rubs, murmurs or gallops Pulm: clear to auscultation bilaterally, moving normal volumes of air Abd: soft, nontender, nondistended, BS present Ext: palpable thrill in LUE, left arm in sling,  trace pedal edema Neuro: alert and oriented X3, cranial nerves II-XII grossly intact   Labs: Basic Metabolic Panel:  Recent Labs Lab 07/06/14 0557 07/06/14 1424 07/06/14 1541 07/07/14 0500  NA 138 139  --  142  K 3.7 3.4*  --  3.7  CL  --  98  --  104  CO2  --  22  --  21   GLUCOSE 134* 185*  --  94  BUN  --  78*  --  76*  CREATININE  --  6.39*  --  6.05*  CALCIUM  --  8.8  --  8.3*  PHOS  --   --  5.0*  --    Liver Function Tests: No results found for this basename: AST, ALT, ALKPHOS, BILITOT, PROT, ALBUMIN,  in the last 168 hours No results found for this basename: LIPASE, AMYLASE,  in the last 168 hours No results found for this basename: AMMONIA,  in the last 168 hours CBC:  Recent Labs Lab 07/06/14 0557 07/06/14 1424 07/07/14 0500  WBC  --  10.4 5.8  HGB 11.9* 10.3* 8.6*  HCT 35.0* 32.1* 26.8*  MCV  --  94.4 96.1  PLT  --  270 237   PT/INR: @LABRCNTIP (inr:5) Cardiac Enzymes: ) Recent Labs Lab 07/06/14 1424  TROPONINI <0.30   CBG:  Recent Labs Lab 07/06/14 0934 07/06/14 1412 07/06/14 2221 07/07/14 0745  GLUCAP 117* 211* 205* 102*    Iron Studies: No results found for this basename: IRON, TIBC, TRANSFERRIN, FERRITIN,  in the last 168 hours  Xrays/Other Studies: Dg Chest Port 1 View  07/06/2014   CLINICAL DATA:  Chest pain.  EXAM: PORTABLE CHEST - 1 VIEW  COMPARISON:  07/02/2014.  FINDINGS: The cardiac silhouette, mediastinal and hilar contours are within normal limits and stable. Stable tortuosity and calcification of the thoracic aorta. Stable surgical changes from bypass surgery. Slightly low lung volumes with mild vascular crowding and streaky atelectasis. No infiltrates, edema or effusions. Chronic appearing right-sided pleural thickening. The bony thorax is intact.  IMPRESSION: Low lung volumes with vascular crowding and atelectasis.   Electronically Signed   By: Kalman Jewels M.D.   On: 07/06/2014 22:20   Dg Shoulder Left  07/06/2014   CLINICAL DATA:  Anterior and lateral shoulder pain.  Fall.  EXAM: LEFT SHOULDER - 2+ VIEW  COMPARISON:  None.  FINDINGS: Humeral head is located. The shoulder is internally rotated and there is a nondisplaced fracture of the inferior aspect of the greater tuberosity. Moderate AC joint  osteoarthritis. Scapula appears intact. Surgical clips are present in the upper arm.  There appears to be a transverse nondisplaced fracture plane through the proximal LEFT humerus. The fracture fragments are not displaced and the radiographic appearance is most compatible with a 1 part fracture.  IMPRESSION: Nondisplaced fracture of the proximal humerus. Noncontrast CT recommended for further characterization because the arm is internally rotated on these radiographs.   Electronically Signed   By: Dereck Ligas M.D.   On: 07/06/2014 18:21   Diagnostic Studies: Renal U/S 06/21/14, Right kidney 11.0 cm, 61mm interpolar calculus, left kidney 10.0 cm, no mass ,  no hydronephrosis.  Renal Artery Duplex 06/22/14: No evidence of Renal artery stenosis  Echo 06/21/14 EF 55-65%, moderate LVH, Grade 2 DD.  Assessment/Plan: 1.  CKD Stage 5- SCr stable, left fistula placed yesterday in preparation for dialysis.  Patient does not need urgent dialysis at this time. 1. 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  Per his wife's report, his baseline Scr was in the high 2's to 3's but over the last month it has risen to above 6.  No uremic symptoms and will cont to follow 2. Orthostatic hypotension- likely component of hypovolemia from poor PO intake, continue compression hose, hold lasix, agree with cardiology that addition of abdominal binder may be helpful. 1. Discussed with Dr.s Stanford Breed and Lorrene Reid.  Agree with compression hose and abd binder. 2. Would recommend stopping labetalol and switching to B1 selective agent such as metoprolol if ok with cards 3. Would recommend holding lasix for now and follow volume status 4. Titrate meds based on standing BP as able and would follow.  Would also allow for higher supine BP's (242-683 systolic) 5. Would also recommend reclining wheelchair given fall risk (especially once he needs to start  hemodialysis) 6. Consider Mestinon if refractory to current plan 3. Anemia in CKD- will likely need aranesp once BP fluctuations managed (last dose 06/24/14) 4. Hx of Cardiac transplant- continue home medications 5. HTN: continue labetalol 100mg  TID 6. Left non displaced humerus fracture: outpatient ortho follow up   Lucious Groves 07/07/2014, 11:26 AM  Internal Medicine PGY2 Pager [phone]  I have seen and examined this patient and agree with plan as outlined by Dr. Heber Bolton except for additions made above. Aldine Chakraborty A,MD 07/07/2014 3:20 PM

## 2014-07-07 NOTE — Consult Note (Signed)
[facility]  Subjective  - Asked by Dr Sloan Leiter to check pt's fistula.  S/p fall with left humerus fracture.  Pt had left basilic vein fistula by Dr Donnetta Hutching yesterday.   Objective 152/68 86 98.3 F (36.8 C) (Oral) 18 99%  Intake/Output Summary (Last 24 hours) at 07/07/14 1153 Last data filed at 07/06/14 2222  Gross per 24 hour  Intake   1462 ml  Output    575 ml  Net    887 ml   Left upper arm incisions intact, some edema and ecchymosis medial upper arm but no focal hematoma, +thrill  Xray non displaced humerus fracture  Assessment/Planning:  Left humerus fracture Patent fistula no obvious disruption of anastomosis or hematoma Will let Dr Donnetta Hutching know of pt admission   [name] [name] E 07/07/2014 11:53 AM --

## 2014-07-07 NOTE — Progress Notes (Signed)
Patient arrive to unit in no s/s of distress. Pt A&Ox 4. Pt oriented to room and unit. Callbell within reach. Will continue to monitor. VS stable. BP elevated - scheduled and prn meds admin. Pt placed on tele. Whiteboard updated. Report received from ED nurse prior to pt's arrival.

## 2014-07-07 NOTE — Progress Notes (Signed)
PATIENT DETAILS Name: [name] [name] Age: 73 y.o. Sex: male Date of Birth: 12-29-1940 Admit Date: 07/06/2014 Admitting Physician Rise Patience, MD PCP:No PCP Per Patient  Subjective: Patient has no new complaints today. His left shoulder pain is fairly well managed.   Assessment/Plan: Near syncope -secondary to orthostatic hypotension (chronic issue)-likely worsened by fistula placement, anesthesia -complicated medical history-s/p cardiac transplant, Stage 5 CKD-and chronic orthostatic hypotension, will need optimization of antihypertensive and diuretic regimen. Received IVF in the ED. Will consult both Renal and Cards for further assistance.  Chronic kidney disease stage V -S/P left AV fistula placement 8/17 -will need optimization of antihypertensive/diuretic regimen-given severe orthostatic changes-have asked Renal to assist in management  Orthostatic Hypotension -see above-chronic issue-await further recommendations from cards/renal -Continue orthostatic vitals  Left humerus fracture -Consult orthopedics -manage pain with acetaminophen and oxycodone -zofran PRN for nausea  Hx of cardiac transplant -Continue immunosuppressants for now -await cards eval  Hx of HTN -per patient has elevated BP lying down, but has chronic orthostatic hypotension-immediately standing up. On compression stockings at home -cautiously continue with current antihypertensive-pending Renal/Cardiac eval  Type II or unspecified type diabetes mellitus with unspecified complication, uncontrolled -Renal/Carb-modified diet -CBG's stable-with SSI   Hypothyroidism -Continue Synthroid  Chronic Anemia-secondary to CKD -Follow CBCs -Supplement with ferrous sulfate  Disposition: Remain inpatient  DVT Prophylaxis: Changed to prophylactic Heparin given CKD status; patient was already given lovenox dose today, 8/18; will initiate new therapy tomorrow, 8/19.   Code Status: Full code     Family Communication None present  Procedures:  None  CONSULTS:  cardiology, nephrology and orthopedic surgery  Time spent 40 minutes-which includes 50% of the time with face-to-face with patient/ family and coordinating care related to the above assessment and plan.  MEDICATIONS: Scheduled Meds: . aspirin  325 mg Oral QHS  . calcitRIOL  0.25 mcg Oral Q M,W,F  . cycloSPORINE modified  25 mg Oral QHS  . cycloSPORINE modified  50 mg Oral Daily  . enoxaparin (LOVENOX) injection  30 mg Subcutaneous Q24H  . ferrous sulfate  325 mg Oral TID WC  . FLUoxetine  20 mg Oral Q breakfast  . furosemide  40 mg Oral BID  . insulin aspart  0-9 Units Subcutaneous TID WC  . labetalol  100 mg Oral TID  . levothyroxine  75 mcg Oral QAC breakfast  . mycophenolate  250 mg Oral QHS  . mycophenolate  500 mg Oral Daily  . pantoprazole  40 mg Oral Daily  . saccharomyces boulardii  250 mg Oral QODAY  . simvastatin  5 mg Oral q1800  . sodium bicarbonate  650 mg Oral TID  . sodium chloride  3 mL Intravenous Q12H  . vitamin B-12  1,000 mcg Oral Q breakfast   Continuous Infusions:  PRN Meds:.acetaminophen, acetaminophen, hydrALAZINE, ondansetron (ZOFRAN) IV, ondansetron, oxyCODONE  Antibiotics: Anti-infectives   None     PHYSICAL EXAM: Vital signs in last 24 hours: Filed Vitals:   07/06/14 2321 07/07/14 0539 07/07/14 0630 07/07/14 1031  BP: 165/71 166/76  152/68  Pulse: 85 71  86  Temp:  98.3 F (36.8 C)    TempSrc:  Oral    Resp: 18 18    Height:      Weight:   68.2 kg (150 lb 5.7 oz)   SpO2:  99%      Weight change:  Filed Weights   07/06/14 1353 07/06/14 2143 07/07/14 0630  Weight: 62.143 kg (  137 lb) 61.5 kg (135 lb 9.3 oz) 68.2 kg (150 lb 5.7 oz)   Body mass index is 19.3 kg/(m^2).   Gen Exam: Awake and alert with clear speech. Sitting up in bed with left arm in sling. Neck: Supple, No JVD.   Chest: B/L Clear. No wheezes, rales or rhonci CVS: S1 S2 Regular, no murmurs,  gallops or rubs Abdomen: soft, BS +, non tender, non distended.  Extremities: no edema, lower extremities warm to touch. AV Fistula in left arm.   Neurologic: Non Focal.   Skin: No Rash.   Wounds: N/A.  Intake/Output from previous day:  Intake/Output Summary (Last 24 hours) at 07/07/14 1043 Last data filed at 07/06/14 2222  Gross per 24 hour  Intake   1462 ml  Output    575 ml  Net    887 ml    LAB RESULTS: CBC  Recent Labs Lab 07/06/14 0557 07/06/14 1424 07/07/14 0500  WBC  --  10.4 5.8  HGB 11.9* 10.3* 8.6*  HCT 35.0* 32.1* 26.8*  PLT  --  270 237  MCV  --  94.4 96.1  MCH  --  30.3 30.8  MCHC  --  32.1 32.1  RDW  --  14.7 14.8   Chemistries   Recent Labs Lab 07/06/14 0557 07/06/14 1424 07/06/14 1541 07/07/14 0500  NA 138 139  --  142  K 3.7 3.4*  --  3.7  CL  --  98  --  104  CO2  --  22  --  21  GLUCOSE 134* 185*  --  94  BUN  --  78*  --  76*  CREATININE  --  6.39*  --  6.05*  CALCIUM  --  8.8  --  8.3*  MG  --   --  1.6  --    CBG:  Recent Labs Lab 07/06/14 0934 07/06/14 1412 07/06/14 2221 07/07/14 0745  GLUCAP 117* 211* 205* 102*   GFR Estimated Creatinine Clearance: 10.5 ml/min (by C-G formula based on Cr of 6.05).  Cardiac Enzymes  Recent Labs Lab 07/06/14 1424  TROPONINI <0.30    RADIOLOGY STUDIES/RESULTS: Dg Chest 2 View 04-Jul-2014   CLINICAL DATA:  Preoperative evaluation for shunt creation in arm, history hypertension, diabetes, chronic kidney disease, heart transplant  EXAM: CHEST  2 VIEW  COMPARISON:  None  FINDINGS: Post median sternotomy and by history heart transplantation.  Atherosclerotic calcification aorta.  Mediastinal contours with pulmonary vascularity normal.  Atelectasis and/or scarring in RIGHT middle lobe with volume loss.  Underlying emphysematous changes.  Remaining lungs clear.  No infiltrate, pleural effusion or pneumothorax.  No acute osseous findings.  IMPRESSION: Post heart transplant.  COPD changes with  atelectasis and/or scarring in RIGHT middle lobe.   Electronically Signed   By: Lavonia Dana M.D.   On: 07/04/14 16:10   US Renal 06/21/2014   CLINICAL DATA:  Acute on chronic renal failure  EXAM: RENAL/URINARY TRACT ULTRASOUND COMPLETE  COMPARISON:  None.  FINDINGS: Right Kidney:  Length: 11.0 cm. 8 mm interpolar calculus. No mass or hydronephrosis.  Left Kidney:  Length: 10.0 cm.  No mass or hydronephrosis.  Bladder:  Within normal limits.  IMPRESSION: 8 mm interpolar right renal calculus.  No hydronephrosis.   Electronically Signed   By: Julian Hy M.D.   On: 06/21/2014 09:21   Dg Chest Port 1 View 07/06/2014   CLINICAL DATA:  Chest pain.  EXAM: PORTABLE CHEST - 1 VIEW  COMPARISON:  07/02/2014.  FINDINGS: The cardiac silhouette, mediastinal and hilar contours are within normal limits and stable. Stable tortuosity and calcification of the thoracic aorta. Stable surgical changes from bypass surgery. Slightly low lung volumes with mild vascular crowding and streaky atelectasis. No infiltrates, edema or effusions. Chronic appearing right-sided pleural thickening. The bony thorax is intact.  IMPRESSION: Low lung volumes with vascular crowding and atelectasis.   Electronically Signed   By: Kalman Jewels M.D.   On: 07/06/2014 22:20   Dg Shoulder Left 07/06/2014   CLINICAL DATA:  Anterior and lateral shoulder pain.  Fall.  EXAM: LEFT SHOULDER - 2+ VIEW  COMPARISON:  None.  FINDINGS: Humeral head is located. The shoulder is internally rotated and there is a nondisplaced fracture of the inferior aspect of the greater tuberosity. Moderate AC joint osteoarthritis. Scapula appears intact. Surgical clips are present in the upper arm.  There appears to be a transverse nondisplaced fracture plane through the proximal LEFT humerus. The fracture fragments are not displaced and the radiographic appearance is most compatible with a 1 part fracture.  IMPRESSION: Nondisplaced fracture of the proximal humerus.  Noncontrast CT recommended for further characterization because the arm is internally rotated on these radiographs.   Electronically Signed   By: Dereck Ligas M.D.   On: 07/06/2014 18:21    Audelia Acton, PA-S [facility] Pager:[phone]  If 7PM-7AM, please contact night-coverage [email or web address] Password [email or web address] 07/07/2014, 10:43 AM   LOS: 1 day   **Disclaimer: This note may have been dictated with voice recognition software. Similar sounding words can inadvertently be transcribed and this note may contain transcription errors which may not have been corrected upon publication of note.**  Attending Patient was seen, examined,treatment plan was discussed with the Physician extender. I have directly reviewed the clinical findings, lab, imaging studies and management of this patient in detail. I have made the necessary changes to the above noted documentation, and agree with the documentation, as recorded by the Physician extender.  Nena Alexander MD [facility].

## 2014-07-07 NOTE — Consult Note (Signed)
Primary cardiologist: [facility]  HPI: 73 year old male with past medical history of cardiac transplant, orthostatic hypotension, stage 5 renal insufficiency for evaluation of orthostasis. Patient is status post cardiac transplant in December of 2001. He has previously been followed at [facility] in [place]. Recently moved to this area to be closer to family. Presented on August 2 to have his potassium checked. His blood pressure was markedly elevated. His medications were adjusted including changing metoprolol to labetalol, continuing hydralazine and Lasix was added by nephrology. At discharge compression hose were recommended with possible abdominal binder. Following discharge his orthostatic symptoms worsened. He contacted the hospitalist and his hydralazine was discontinued and labetalol decreased. His symptoms of orthostasis improved for 2-3 days. The symptoms then worsened again. He presented yesterday for AV fistula placement. Upon returning home and getting out of his car he developed severe weakness. He fell and fractured his humerus. He did not have chest pain, dyspnea or syncope. He was noted to be severely orthostatic in the emergency room and given fluid. Cardiology asked to evaluate. Note echocardiogram August 2015 showed transplanted heart, normal LV function, left ventricular hypertrophy and biatrial enlargement.  Medications Prior to Admission  Medication Sig Dispense Refill  . acetaminophen (TYLENOL) 500 MG tablet Take 1,000 mg by mouth every 6 (six) hours as needed for mild pain.      Marland Kitchen aspirin 325 MG tablet Take 325 mg by mouth at bedtime.      . calcitRIOL (ROCALTROL) 0.25 MCG capsule Take 0.25 mcg by mouth every Monday, Wednesday, and Friday.      . cycloSPORINE modified (NEORAL) 25 MG capsule Take 25-50 mg by mouth 2 (two) times daily. 50 mg in the morning and 25 mg at night      . ferrous sulfate 325 (65 FE) MG tablet Take 325 mg by mouth 3 (three) times daily with  meals.      Marland Kitchen FLUoxetine (PROZAC) 20 MG capsule Take 20 mg by mouth daily with breakfast.      . furosemide (LASIX) 40 MG tablet Take 40 mg by mouth 2 (two) times daily.      Marland Kitchen labetalol (NORMODYNE) 100 MG tablet Take 100 mg by mouth 3 (three) times daily.      . lansoprazole (PREVACID) 15 MG capsule Take 30 mg by mouth 2 (two) times daily.      Marland Kitchen levothyroxine (SYNTHROID, LEVOTHROID) 75 MCG tablet Take 75 mcg by mouth daily before breakfast.      . mycophenolate (CELLCEPT) 250 MG capsule Take 250-500 mg by mouth 2 (two) times daily. Takes 534m in the morning and 2534min the evening      . oxyCODONE (ROXICODONE) 5 MG immediate release tablet Take 1 tablet (5 mg total) by mouth every 6 (six) hours as needed for severe pain.  20 tablet  0  . pravastatin (PRAVACHOL) 10 MG tablet Take 10 mg by mouth daily with breakfast.      . saccharomyces boulardii (FLORASTOR) 250 MG capsule Take 250 mg by mouth every other day.      . sodium bicarbonate 650 MG tablet Take 650 mg by mouth 3 (three) times daily.      . vitamin B-12 (CYANOCOBALAMIN) 1000 MCG tablet Take 1,000 mcg by mouth daily with breakfast.        Allergies  Allergen Reactions  . Norvasc [Amlodipine Besylate] Swelling    Swelling of the lower body     Past Medical History  Diagnosis Date  .  CKD III-IV     a. since transplant in 2001 with significant worsening since 06/2013.  Marland Kitchen Hypertension     a. Long h/o HTN, came off of all meds following transplant/wt loss-->meds resumed 06/2013, difficult to control since.  Marland Kitchen History of stroke   . Hypercalcemia     a. 06/2013 Hospitalized in [place].  Marland Kitchen Heart transplanted     a. 1995 CABG x 4 in [place], [place];  b. 2001 Developed CHF;  c. 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 10/2000 Cardiac transplant @ [facility], [place];  e. Historically nl Bx and stress testing.  Last stress test ~ 5 yrs ago.  Marland Kitchen Hernia of abdominal wall   . Hyperlipidemia   . Orthostatic hypotension     a. Has tried support hose and  compression stockings - prefers not to wear.  . Coronary artery disease   . Myocardial infarction     PRIOR TO HEART TRANSPLANT  . Shortness of breath     EXERTION  . Orthostatic dizziness   . Stroke 06/2013    NO RESIDUAL - WAS CONFUSED AT TIME - THAT HAS RESOLVED  . Diabetes mellitus without complication     FASTING CBG 90S  . Headache(784.0)   . Cancer     SKIN  . Anemia   . History of blood transfusion     Past Surgical History  Procedure Laterality Date  . Cardiac surgery    . Heart transplant    . Cataract extraction    . Vasectomy    . Skin graft full thickness leg      cat scratch that did not heal  . Coronary artery bypass graft    . Eye surgery Bilateral     CATARACTS    History   Social History  . Marital Status: Married    Spouse Name: N/A    Number of Children: N/A  . Years of Education: N/A   Occupational History  . Education officer, community    Social History Main Topics  . Smoking status: Never Smoker   . Smokeless tobacco: Not on file  . Alcohol Use: No  . Drug Use: No  . Sexual Activity: Not on file   Other Topics Concern  . Not on file   Social History Narrative   Patient is from [place].  Moved to [place] in late '90's.  Moved to [place] 06/20/2014 to live closer to son.  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  Plans to continue to work now that he is in [place].    Family History  Problem Relation Age of Onset  . Heart attack Father     died @ 69  . Heart attack Mother     ROS: Pain in LUE; no fevers or chills, productive cough, hemoptysis, dysphasia, odynophagia, melena, hematochezia, dysuria, hematuria, rash, seizure activity, orthopnea, PND, pedal edema, claudication. Remaining systems are negative.  Physical Exam:   Blood pressure 152/68, pulse 86, temperature 98.3 F (36.8 C), temperature source Oral, resp. rate 18, height 6' 2"  (1.88 m), weight 150 lb 5.7 oz (68.2 kg), SpO2 99.00%.  General:  Well  developed/chronically ill appearin in NAD Skin warm/dry Patient not depressed No peripheral clubbing Back-normal HEENT-normal/normal eyelids Neck supple/normal carotid upstroke bilaterally; no bruits; no JVD; no thyromegaly chest - CTA/ normal expansion CV - RRR/normal S1 and S2; no murmurs, rubs or gallops;  PMI nondisplaced Abdomen -NT/ND, no HSM, no mass, + bowel sounds, no bruit 2+  femoral pulses, no bruits Ext-no edema, chords; Diminished distal pulses; AV fistula left upper extremity and status post fracture of humerus. Neuro-grossly nonfocal  ECG Sinus rhythm, right bundle branch block, nonspecific ST changes.  Results for orders placed during the hospital encounter of 07/06/14 (from the past 48 hour(s))  CBG MONITORING, ED     Status: Abnormal   Collection Time    07/06/14  2:12 PM      Result Value Ref Range   Glucose-Capillary 211 (*) 70 - 99 mg/dL  CBC     Status: Abnormal   Collection Time    07/06/14  2:24 PM      Result Value Ref Range   WBC 10.4  4.0 - 10.5 K/uL   RBC 3.40 (*) 4.22 - 5.81 MIL/uL   Hemoglobin 10.3 (*) 13.0 - 17.0 g/dL   HCT 32.1 (*) 39.0 - 52.0 %   MCV 94.4  78.0 - 100.0 fL   MCH 30.3  26.0 - 34.0 pg   MCHC 32.1  30.0 - 36.0 g/dL   RDW 14.7  11.5 - 15.5 %   Platelets 270  150 - 400 K/uL  BASIC METABOLIC PANEL     Status: Abnormal   Collection Time    07/06/14  2:24 PM      Result Value Ref Range   Sodium 139  137 - 147 mEq/L   Potassium 3.4 (*) 3.7 - 5.3 mEq/L   Chloride 98  96 - 112 mEq/L   CO2 22  19 - 32 mEq/L   Glucose, Bld 185 (*) 70 - 99 mg/dL   BUN 78 (*) 6 - 23 mg/dL   Creatinine, Ser 6.39 (*) 0.50 - 1.35 mg/dL   Calcium 8.8  8.4 - 10.5 mg/dL   GFR calc non Af Amer 8 (*) >90 mL/min   GFR calc Af Amer 9 (*) >90 mL/min   Comment: (NOTE)     The eGFR has been calculated using the CKD EPI equation.     This calculation has not been validated in all clinical situations.     eGFR's persistently <90 mL/min signify possible Chronic  Kidney     Disease.   Anion gap 19 (*) 5 - 15  TROPONIN I     Status: None   Collection Time    07/06/14  2:24 PM      Result Value Ref Range   Troponin I <0.30  <0.30 ng/mL   Comment:            Due to the release kinetics of cTnI,     a negative result within the first hours     of the onset of symptoms does not rule out     myocardial infarction with certainty.     If myocardial infarction is still suspected,     repeat the test at appropriate intervals.  MAGNESIUM     Status: None   Collection Time    07/06/14  3:41 PM      Result Value Ref Range   Magnesium 1.6  1.5 - 2.5 mg/dL  PHOSPHORUS     Status: Abnormal   Collection Time    07/06/14  3:41 PM      Result Value Ref Range   Phosphorus 5.0 (*) 2.3 - 4.6 mg/dL  GLUCOSE, CAPILLARY     Status: Abnormal   Collection Time    07/06/14 10:21 PM      Result Value Ref Range   Glucose-Capillary 205 (*) 70 -  99 mg/dL  BASIC METABOLIC PANEL     Status: Abnormal   Collection Time    07/07/14  5:00 AM      Result Value Ref Range   Sodium 142  137 - 147 mEq/L   Potassium 3.7  3.7 - 5.3 mEq/L   Chloride 104  96 - 112 mEq/L   CO2 21  19 - 32 mEq/L   Glucose, Bld 94  70 - 99 mg/dL   BUN 76 (*) 6 - 23 mg/dL   Creatinine, Ser 6.05 (*) 0.50 - 1.35 mg/dL   Calcium 8.3 (*) 8.4 - 10.5 mg/dL   GFR calc non Af Amer 8 (*) >90 mL/min   GFR calc Af Amer 10 (*) >90 mL/min   Comment: (NOTE)     The eGFR has been calculated using the CKD EPI equation.     This calculation has not been validated in all clinical situations.     eGFR's persistently <90 mL/min signify possible Chronic Kidney     Disease.   Anion gap 17 (*) 5 - 15  CBC     Status: Abnormal   Collection Time    07/07/14  5:00 AM      Result Value Ref Range   WBC 5.8  4.0 - 10.5 K/uL   RBC 2.79 (*) 4.22 - 5.81 MIL/uL   Hemoglobin 8.6 (*) 13.0 - 17.0 g/dL   HCT 26.8 (*) 39.0 - 52.0 %   MCV 96.1  78.0 - 100.0 fL   MCH 30.8  26.0 - 34.0 pg   MCHC 32.1  30.0 - 36.0 g/dL    RDW 14.8  11.5 - 15.5 %   Platelets 237  150 - 400 K/uL  GLUCOSE, CAPILLARY     Status: Abnormal   Collection Time    07/07/14  7:45 AM      Result Value Ref Range   Glucose-Capillary 102 (*) 70 - 99 mg/dL    Dg Chest Port 1 View  07/06/2014   CLINICAL DATA:  Chest pain.  EXAM: PORTABLE CHEST - 1 VIEW  COMPARISON:  07/02/2014.  FINDINGS: The cardiac silhouette, mediastinal and hilar contours are within normal limits and stable. Stable tortuosity and calcification of the thoracic aorta. Stable surgical changes from bypass surgery. Slightly low lung volumes with mild vascular crowding and streaky atelectasis. No infiltrates, edema or effusions. Chronic appearing right-sided pleural thickening. The bony thorax is intact.  IMPRESSION: Low lung volumes with vascular crowding and atelectasis.   Electronically Signed   By: Kalman Jewels M.D.   On: 07/06/2014 22:20   Dg Shoulder Left  07/06/2014   CLINICAL DATA:  Anterior and lateral shoulder pain.  Fall.  EXAM: LEFT SHOULDER - 2+ VIEW  COMPARISON:  None.  FINDINGS: Humeral head is located. The shoulder is internally rotated and there is a nondisplaced fracture of the inferior aspect of the greater tuberosity. Moderate AC joint osteoarthritis. Scapula appears intact. Surgical clips are present in the upper arm.  There appears to be a transverse nondisplaced fracture plane through the proximal LEFT humerus. The fracture fragments are not displaced and the radiographic appearance is most compatible with a 1 part fracture.  IMPRESSION: Nondisplaced fracture of the proximal humerus. Noncontrast CT recommended for further characterization because the arm is internally rotated on these radiographs.   Electronically Signed   By: Dereck Ligas M.D.   On: 07/06/2014 18:21    Assessment/Plan 1 orthostatic hypotension-very difficult situation. Patient has severe orthostasis. However he also  has severe renal insufficiency nearing end-stage. He was initiated on  Lasix by nephrology at last hospitalization. He states he was not edematous. I would like for him to be off of Lasix if possible to decrease orthostatic symptoms (hold for now). I would also like to liberalize fluid restriction if possible. I will discuss this with nephrology. We need to balance treating his renal failure with orthostasic symptoms. I would recommend continuing compression hose. We could also consider an abdominal binder. Finally if above is not successful he may require a medication such as Mestinon. 2 hypertension-We can continue labetalol for now. Further adjustments based on followup readings. We need to allow his blood pressure to run higher in the supine position to decrease orthostatic symptoms with standing. 3 chronic stage 5 renal failure-nephrology following. 4 status post cardiac transplant-continue preadmission medications. 5 status post humerus fracture-management per orthopedics and primary care.  Kirk Ruths MD 07/07/2014, 10:58 AM

## 2014-07-08 ENCOUNTER — Encounter (HOSPITAL_COMMUNITY): Payer: Self-pay | Admitting: Vascular Surgery

## 2014-07-08 DIAGNOSIS — E860 Dehydration: Secondary | ICD-10-CM

## 2014-07-08 DIAGNOSIS — Z941 Heart transplant status: Secondary | ICD-10-CM

## 2014-07-08 DIAGNOSIS — I951 Orthostatic hypotension: Secondary | ICD-10-CM | POA: Diagnosis not present

## 2014-07-08 LAB — BASIC METABOLIC PANEL
Anion gap: 19 — ABNORMAL HIGH (ref 5–15)
BUN: 84 mg/dL — ABNORMAL HIGH (ref 6–23)
CALCIUM: 8.2 mg/dL — AB (ref 8.4–10.5)
CO2: 21 mEq/L (ref 19–32)
Chloride: 102 mEq/L (ref 96–112)
Creatinine, Ser: 6.14 mg/dL — ABNORMAL HIGH (ref 0.50–1.35)
GFR calc non Af Amer: 8 mL/min — ABNORMAL LOW (ref >90)
GFR, EST AFRICAN AMERICAN: 9 mL/min — AB (ref >90)
Glucose, Bld: 106 mg/dL — ABNORMAL HIGH (ref 70–99)
POTASSIUM: 3.8 meq/L (ref 3.7–5.3)
SODIUM: 142 meq/L (ref 137–147)

## 2014-07-08 LAB — CBC
HCT: 24.9 % — ABNORMAL LOW (ref 39.0–52.0)
Hemoglobin: 8.2 g/dL — ABNORMAL LOW (ref 13.0–17.0)
MCH: 30.7 pg (ref 26.0–34.0)
MCHC: 32.9 g/dL (ref 30.0–36.0)
MCV: 93.3 fL (ref 78.0–100.0)
PLATELETS: 204 10*3/uL (ref 150–400)
RBC: 2.67 MIL/uL — ABNORMAL LOW (ref 4.22–5.81)
RDW: 14.8 % (ref 11.5–15.5)
WBC: 6.4 10*3/uL (ref 4.0–10.5)

## 2014-07-08 LAB — GLUCOSE, CAPILLARY
GLUCOSE-CAPILLARY: 118 mg/dL — AB (ref 70–99)
GLUCOSE-CAPILLARY: 153 mg/dL — AB (ref 70–99)
Glucose-Capillary: 161 mg/dL — ABNORMAL HIGH (ref 70–99)

## 2014-07-08 MED ORDER — SENNA 8.6 MG PO TABS
2.0000 | ORAL_TABLET | Freq: Every day | ORAL | Status: DC
  Administered 2014-07-08 – 2014-07-10 (×3): 17.2 mg via ORAL
  Filled 2014-07-08 (×3): qty 2

## 2014-07-08 MED ORDER — PANTOPRAZOLE SODIUM 40 MG PO TBEC: 40.0000 mg | DELAYED_RELEASE_TABLET | Freq: Every day | ORAL | Status: DC

## 2014-07-08 MED ORDER — HEPARIN SODIUM (PORCINE) 5000 UNIT/ML IJ SOLN
5000.0000 [IU] | Freq: Three times a day (TID) | INTRAMUSCULAR | Status: DC
  Administered 2014-07-08 – 2014-07-10 (×6): 5000 [IU] via SUBCUTANEOUS
  Filled 2014-07-08 (×7): qty 1

## 2014-07-08 MED ORDER — DOCUSATE SODIUM 100 MG PO CAPS
100.0000 mg | ORAL_CAPSULE | Freq: Two times a day (BID) | ORAL | Status: DC
  Administered 2014-07-08 – 2014-07-10 (×5): 100 mg via ORAL
  Filled 2014-07-08 (×5): qty 1

## 2014-07-08 MED ORDER — METOPROLOL TARTRATE 25 MG PO TABS
25.0000 mg | ORAL_TABLET | Freq: Two times a day (BID) | ORAL | Status: DC
  Administered 2014-07-08: 25 mg via ORAL
  Filled 2014-07-08 (×4): qty 1

## 2014-07-08 NOTE — Progress Notes (Signed)
S:Patient reports he is feeling better this morning after BP med change from labetalol to metoprolol.  Last night at PM shift change he was still orthostatic with SBP drop of 43 from laying to sitting.  Orthostatics have yet to be rechecked this morning after change to metoprolol.   O:BP 181/72  Pulse 80  Temp(Src) 98.2 F (36.8 C) (Oral)  Resp 18  Ht 6\' 2"  (1.88 m)  Wt 149 lb 6.4 oz (67.767 kg)  BMI 19.17 kg/m2  SpO2 100%  Intake/Output Summary (Last 24 hours) at 07/08/14 0909 Last data filed at 07/08/14 0507  Gross per 24 hour  Intake    627 ml  Output    900 ml  Net   -273 ml   Intake/Output: I/O last 3 completed shifts: In: 1089 [P.O.:1089] Out: 1200 [Urine:1200]  Intake/Output this shift:    Weight change: 12 lb 6.4 oz (5.625 kg) IWP:[id number] in bed CVS:RRR no murmur Resp:CTAB Abd:+ BS, soft non tender ASN:KNLZ arm in sling, +bruit in left upper extremity, no edema   Recent Labs Lab 07/06/14 0557 07/06/14 1424 07/06/14 1541 07/07/14 0500 07/08/14 0814  NA 138 139  --  142 142  K 3.7 3.4*  --  3.7 3.8  CL  --  98  --  104 102  CO2  --  22  --  21 21  GLUCOSE 134* 185*  --  94 106*  BUN  --  78*  --  76* 84*  CREATININE  --  6.39*  --  6.05* 6.14*  CALCIUM  --  8.8  --  8.3* 8.2*  PHOS  --   --  5.0*  --   --    Liver Function Tests: No results found for this basename: AST, ALT, ALKPHOS, BILITOT, PROT, ALBUMIN,  in the last 168 hours No results found for this basename: LIPASE, AMYLASE,  in the last 168 hours No results found for this basename: AMMONIA,  in the last 168 hours CBC:  Recent Labs Lab 07/06/14 1424 07/07/14 0500 07/08/14 0814  WBC 10.4 5.8 6.4  HGB 10.3* 8.6* 8.2*  HCT 32.1* 26.8* 24.9*  MCV 94.4 96.1 93.3  PLT 270 237 204   Cardiac Enzymes:  Recent Labs Lab 07/06/14 1424  TROPONINI <0.30   CBG:  Recent Labs Lab 07/07/14 0745 07/07/14 1142 07/07/14 1655 07/07/14 2143 07/08/14 0746  GLUCAP 102* 152* 150* 135* 118*     Iron Studies: No results found for this basename: IRON, TIBC, TRANSFERRIN, FERRITIN,  in the last 72 hours Studies/Results: Dg Chest Port 1 View  07/06/2014   CLINICAL DATA:  Chest pain.  EXAM: PORTABLE CHEST - 1 VIEW  COMPARISON:  07/02/2014.  FINDINGS: The cardiac silhouette, mediastinal and hilar contours are within normal limits and stable. Stable tortuosity and calcification of the thoracic aorta. Stable surgical changes from bypass surgery. Slightly low lung volumes with mild vascular crowding and streaky atelectasis. No infiltrates, edema or effusions. Chronic appearing right-sided pleural thickening. The bony thorax is intact.  IMPRESSION: Low lung volumes with vascular crowding and atelectasis.   Electronically Signed   By: Kalman Jewels M.D.   On: 07/06/2014 22:20   Dg Shoulder Left  07/06/2014   CLINICAL DATA:  Anterior and lateral shoulder pain.  Fall.  EXAM: LEFT SHOULDER - 2+ VIEW  COMPARISON:  None.  FINDINGS: Humeral head is located. The shoulder is internally rotated and there is a nondisplaced fracture of the inferior aspect of the greater tuberosity. Moderate AC  joint osteoarthritis. Scapula appears intact. Surgical clips are present in the upper arm.  There appears to be a transverse nondisplaced fracture plane through the proximal LEFT humerus. The fracture fragments are not displaced and the radiographic appearance is most compatible with a 1 part fracture.  IMPRESSION: Nondisplaced fracture of the proximal humerus. Noncontrast CT recommended for further characterization because the arm is internally rotated on these radiographs.   Electronically Signed   By: Dereck Ligas M.D.   On: 07/06/2014 18:21   . aspirin  325 mg Oral QHS  . calcitRIOL  0.25 mcg Oral Q M,W,F  . cycloSPORINE modified  25 mg Oral QHS  . cycloSPORINE modified  50 mg Oral Daily  . feeding supplement (NEPRO CARB STEADY)  237 mL Oral BID BM  . ferrous sulfate  325 mg Oral TID WC  . FLUoxetine  20 mg  Oral Q breakfast  . heparin  5,000 Units Intravenous TID  . insulin aspart  0-9 Units Subcutaneous TID WC  . levothyroxine  75 mcg Oral QAC breakfast  . metoprolol tartrate  25 mg Oral BID  . mycophenolate  250 mg Oral QHS  . mycophenolate  500 mg Oral Daily  . pantoprazole  40 mg Oral Daily  . saccharomyces boulardii  250 mg Oral QODAY  . simvastatin  5 mg Oral q1800  . sodium bicarbonate  650 mg Oral TID  . sodium chloride  3 mL Intravenous Q12H  . vitamin B-12  1,000 mcg Oral Q breakfast    BMET    Component Value Date/Time   NA 142 07/08/2014 0814   K 3.8 07/08/2014 0814   CL 102 07/08/2014 0814   CO2 21 07/08/2014 0814   GLUCOSE 106* 07/08/2014 0814   BUN 84* 07/08/2014 0814   CREATININE 6.14* 07/08/2014 0814   CALCIUM 8.2* 07/08/2014 0814   GFRNONAA 8* 07/08/2014 0814   GFRAA 9* 07/08/2014 0814   CBC    Component Value Date/Time   WBC 6.4 07/08/2014 0814   RBC 2.67* 07/08/2014 0814   HGB 8.2* 07/08/2014 0814   HCT 24.9* 07/08/2014 0814   PLT 204 07/08/2014 0814   MCV 93.3 07/08/2014 0814   MCH 30.7 07/08/2014 0814   MCHC 32.9 07/08/2014 0814   RDW 14.8 07/08/2014 0814     Assessment/Plan:  1. 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 2. Orthostatic Hypotension- hold lasix, compression stockings and abdominal binder.  Patient is symptomatically improved after changing labetalol to Metoprolol.  Will need further titration of medication based on standing BP.  Recommend reclining wheelchair due to fall risk and may need mestinon if refractory. 3. Anemia in CKD 4. Hx of Cardiac Transplant- continue home medications 5. ZHG:DJMEQASTMH 25mg  BID, will need to titrate based on standing BP as able, allow for higher supine BP (962-229 systolic) 6. Left non displaced humerus fracture: ortho saw inpatient, conservative therapy  Lucious Groves Internal Medicine PGY2  Pager [phone]  I have seen and examined this patient and agree with  plan as outlined by Dr. Heber Vinton.  Subjectively better, cont with current regimen and follow orthostatic BP's and renal function.  No uremic symptoms, although has advanced CKD at baseline.  Cont to follow for now.  If he does progress to ESRD,he will require a reclining wheelchair to facilitate HD without transfer to a recliner given his increased risk for falls after HD. [name] [name] A,MD 07/08/2014 10:44 AM

## 2014-07-08 NOTE — Progress Notes (Signed)
Subjective: Feeling better.    Objective: Vital signs in last 24 hours: Temp:  [98.1 F (36.7 C)-98.3 F (36.8 C)] 98.2 F (36.8 C) (08/19 0500) Pulse Rate:  [76-86] 80 (08/19 0850) Resp:  [18-20] 18 (08/19 0500) BP: (152-183)/(63-77) 181/72 mmHg (08/19 0850) SpO2:  [99 %-100 %] 100 % (08/19 0500) Weight:  [149 lb 6.4 oz (67.767 kg)] 149 lb 6.4 oz (67.767 kg) (08/19 0500) Last BM Date: 07/07/14  Intake/Output from previous day: 08/18 0701 - 08/19 0700 In: 627 [P.O.:627] Out: 900 [Urine:900] Intake/Output this shift:    Medications Current Facility-Administered Medications  Medication Dose Route Frequency Provider Last Rate Last Dose  . acetaminophen (TYLENOL) tablet 650 mg  650 mg Oral Q6H PRN Rise Patience, MD       Or  . acetaminophen (TYLENOL) suppository 650 mg  650 mg Rectal Q6H PRN Rise Patience, MD      . aspirin tablet 325 mg  325 mg Oral QHS Rise Patience, MD   325 mg at 07/07/14 2213  . calcitRIOL (ROCALTROL) capsule 0.25 mcg  0.25 mcg Oral Q M,W,F Rise Patience, MD   0.25 mcg at 07/06/14 2229  . cycloSPORINE modified (NEORAL) capsule 25 mg  25 mg Oral QHS Rise Patience, MD   25 mg at 07/07/14 2213  . cycloSPORINE modified (NEORAL) capsule 50 mg  50 mg Oral Daily Rise Patience, MD   50 mg at 07/07/14 1031  . feeding supplement (NEPRO CARB STEADY) liquid 237 mL  237 mL Oral BID BM Dagmar Hait, RD   237 mL at 07/07/14 1523  . ferrous sulfate tablet 325 mg  325 mg Oral TID WC Rise Patience, MD   325 mg at 07/08/14 0850  . FLUoxetine (PROZAC) capsule 20 mg  20 mg Oral Q breakfast Rise Patience, MD   20 mg at 07/08/14 0850  . heparin injection 5,000 Units  5,000 Units Intravenous TID Melton Alar, PA-C      . hydrALAZINE (APRESOLINE) injection 10 mg  10 mg Intravenous Q4H PRN Rise Patience, MD   10 mg at 07/08/14 0539  . insulin aspart (novoLOG) injection 0-9 Units  0-9 Units Subcutaneous TID WC Rise Patience, MD   1 Units at 07/07/14 1728  . levothyroxine (SYNTHROID, LEVOTHROID) tablet 75 mcg  75 mcg Oral QAC breakfast Rise Patience, MD   75 mcg at 07/08/14 0850  . metoprolol tartrate (LOPRESSOR) tablet 25 mg  25 mg Oral BID Melton Alar, PA-C   25 mg at 07/08/14 0851  . mycophenolate (CELLCEPT) capsule 250 mg  250 mg Oral QHS Rise Patience, MD   250 mg at 07/07/14 2213  . mycophenolate (CELLCEPT) capsule 500 mg  500 mg Oral Daily Rise Patience, MD   500 mg at 07/07/14 1031  . ondansetron (ZOFRAN) tablet 4 mg  4 mg Oral Q6H PRN Rise Patience, MD       Or  . ondansetron [facility]) injection 4 mg  4 mg Intravenous Q6H PRN Rise Patience, MD      . oxyCODONE (Oxy IR/ROXICODONE) immediate release tablet 5 mg  5 mg Oral Q6H PRN Rise Patience, MD   5 mg at 07/07/14 1409  . pantoprazole (PROTONIX) EC tablet 40 mg  40 mg Oral Daily Rise Patience, MD   40 mg at 07/07/14 1031  . saccharomyces boulardii (FLORASTOR) capsule 250 mg  250 mg Oral  Joellyn Quails, MD   250 mg at 07/07/14 1031  . simvastatin (ZOCOR) tablet 5 mg  5 mg Oral q1800 Rise Patience, MD   5 mg at 07/07/14 1728  . sodium bicarbonate tablet 650 mg  650 mg Oral TID Rise Patience, MD   650 mg at 07/07/14 2213  . sodium chloride 0.9 % injection 3 mL  3 mL Intravenous Q12H Rise Patience, MD   3 mL at 07/07/14 2213  . vitamin B-12 (CYANOCOBALAMIN) tablet 1,000 mcg  1,000 mcg Oral Q breakfast Rise Patience, MD   1,000 mcg at 07/08/14 0850    PE: General appearance: alert, cooperative and no distress Lungs: clear to auscultation bilaterally Heart: regular rate and rhythm and 1/6 sys MM Extremities: No LEE Pulses: 2+ and symmetric Skin: Warm and dry Neurologic: Grossly normal  Lab Results:   Recent Labs  07/06/14 1424 07/07/14 0500 07/08/14 0814  WBC 10.4 5.8 6.4  HGB 10.3* 8.6* 8.2*  HCT 32.1* 26.8* 24.9*  PLT 270 237 204   BMET  Recent Labs   07/06/14 1424 07/07/14 0500 07/08/14 0814  NA 139 142 142  K 3.4* 3.7 3.8  CL 98 104 102  CO2 22 21 21   GLUCOSE 185* 94 106*  BUN 78* 76* 84*  CREATININE 6.39* 6.05* 6.14*  CALCIUM 8.8 8.3* 8.2*      Assessment/Plan  Principal Problem:   Near syncope Active Problems:   Orthostatic hypotension Feeling better.  Still orthostatic last night and hypertensive.  No orthostatic vitals in computer yet this morning after starting lopressor 25mg  bid.  Per renal, ok with higher supine BP.  Compression hose and abd binder.  Lasix stopped.  Per Dr. Stanford Breed, may need to consider mestinon.  He would like to follow  Up with Dr. Stanford Breed once [facility].    Type II or unspecified type diabetes mellitus with unspecified complication, uncontrolled   Hypothyroidism   Heart transplanted   Hypertensive heart disease   CKD, stage 5   Left non-displaced proximal humerus fracture  No surgery needed.       LOS: 2 days    HAGER, BRYAN PA-C 07/08/2014 9:54 AM  Personally seen and examined. Agree with above. Challenging case. Nothing more to add after reviewing Dr. Tilda Franco note.   Will sign off. Please call if ?Marland Kitchen  Candee Furbish, MD

## 2014-07-08 NOTE — Progress Notes (Signed)
Patient ID: [name] [name], male   DOB: [birth year], 73 y.o.   MRN: [id number] Feeling better. Nausea resolved. He continues to have pain from left arm fracture. He does have an excellent thrill in his vein with very good size maturation. Antecubital wound is well healed. Difficult to evaluate his other incisions do to his inability to move his shoulder and sling. Does have some mild partial blistering over the distal upper arm. Discussed with the patient and his wife. Normal appearance at 48 hours from basilic vein transposition. We will see him again in the office in one month for further evaluation

## 2014-07-08 NOTE — Evaluation (Signed)
Occupational Therapy Evaluation Patient Details Name: [name] [name] MRN: [id number] DOB: 10/22/1941 Today's Date: 07/08/2014    History of Present Illness Patient is a 73 y/o male with history of cardiac transplant in 2001, CKD stage V who had left AV fistula placed 8/17. Pt d/ced home, felt dizzy and s/p fall ambulating into home. Did not hit his head or lose consciousness but hurt his left shoulder. Shoulder XRAY-nondisplaced fx proximal humerus. Abdominal binder ordered. PMH positive for HTN, MI, CVA, and DM.   Clinical Impression   Patient admitted with above. Patient mod I > independent PTA. Patient currently functioning at an overall min>mod assist level. Feel patient will benefit from acute OT to enhance overall performance in the areas of BADLs, functional mobility, and functional use of LUE. Patient with nondisplaced proximal humerus fracture. As stated below in note, per Dr. Ninfa Linden "He will avoid lifting and overhead activities with that arm for the next 6 weeks. He can again, come out of the sling as comfort allows.". Patient limited in participation due to orthostatic hypotension and symptoms. During treatment session, patient sat EOB and BP=117/65 with moderate symptoms. Patient sat EOB for ~5 minutes and BP=154/74 with minimal symptoms. Patient stood with min assist for ~30 seconds, sat down secondary to increased dizziness and symptoms and BP=99/56, then checked again after sitting a few minutes, BP=119/70. Notified RN of these readings and recommendation of thigh high TEDs. Recommending CIR for further OT rehab in regards to patient's overall activity tolerance/endurance, BP regulation during functional tasks, ADL independence, functional mobility, bed mobility, and functional use of LUE.       Follow Up Recommendations  CIR;Supervision/Assistance - 24 hour    Equipment Recommendations  3 in 1 bedside comode    Recommendations for Other Services Rehab consult      Precautions / Restrictions Precautions Precautions: Fall Precaution Comments: orthostatic hypotension; abdominal binder, ted hose. Monitor BP. Sling LUE secondary to humerus fx. No WB status noted in chart. Required Braces or Orthoses: Sling Restrictions Weight Bearing Restrictions: No      Mobility Bed Mobility Overal bed mobility: Needs Assistance Bed Mobility: Rolling;Sidelying to Sit;Supine to Sit;Sit to Supine Rolling: Min assist Sidelying to sit: Mod assist;HOB elevated   Sit to supine: Mod assist;HOB elevated      Transfers                 General transfer comment: Unable to fully assess secondary to orthostatic hypotension and symptoms.    Balance  Defer to PT evaluation                                          ADL Overall ADL's : Needs assistance/impaired                                       General ADL Comments: Unable to fully assess ADL function secondary to orthostatic hypotension and symptoms. Patient with binder and knee high TEDs donned. Recommending thigh high TEDs at this time- notified RN of this.See "Clinical Impression" in note for more information regarding BP.      Vision  No apparent visual impairments                          Pertinent Vitals/Pain Pain Assessment:  No/denies pain Pain Score: 0-No pain     Hand Dominance Right   Extremity/Trunk Assessment Upper Extremity Assessment Upper Extremity Assessment: LUE deficits/detail LUE Deficits / Details: Per Dr. Ninfa Linden  He will avoid lifting and overhead activities with that arm for the next 6 weeks.  He can again, come out of the sling as comfort allows. LUE: Unable to fully assess due to immobilization LUE Sensation:  (seems [facility])   Lower Extremity Assessment Lower Extremity Assessment: Defer to PT evaluation       Communication Communication Communication: No difficulties   Cognition Arousal/Alertness: Awake/alert Behavior  During Therapy: WFL for tasks assessed/performed Overall Cognitive Status: Within Functional Limits for tasks assessed                           Shoulder Instructions   Per Dr. Ninfa Linden: "He will avoid lifting and overhead activities with that arm for the next 6 weeks. He can again, come out of the sling as comfort allows."     Home Living Family/patient expects to be discharged to:: Private residence Living Arrangements: Spouse/significant other Available Help at Discharge: Family Type of Home: Apartment Home Access: Stairs to enter [employer] of Steps: 3 wide steps Entrance Stairs-Rails: Right Home Layout: One level     Bathroom Shower/Tub: Tub/shower unit;Curtain Shower/tub characteristics: Architectural technologist: Standard     Home Equipment: Environmental consultant - 4 wheels;Cane - single point;Grab bars - tub/shower;Shower seat   Additional Comments: Just moved from [facility].      Prior Functioning/Environment Level of Independence: Independent with assistive device(s)        Comments: Uses RW vs SPC for ambulation. Independent with ADLs.    OT Diagnosis: Generalized weakness   OT Problem List: Decreased strength;Decreased range of motion;Decreased activity tolerance;Impaired balance (sitting and/or standing);Decreased safety awareness;Impaired UE functional use   OT Treatment/Interventions: Self-care/ADL training;Therapeutic exercise;Energy conservation;DME and/or AE instruction;Therapeutic activities;Patient/family education;Balance training;Splinting    OT Goals(Current goals can be found in the care plan section) Acute Rehab OT Goals Patient Stated Goal:  (none stated) OT Goal Formulation: With patient/family Time For Goal Achievement: 07/15/14 Potential to Achieve Goals: Good ADL Goals Pt Will Perform Upper Body Bathing: with supervision;sitting Pt Will Perform Lower Body Bathing: with supervision;sit to/from stand Pt Will Transfer to Toilet: with min  assist Additional ADL Goal #1: Patient will tolerate at least 5 minutes of standing in order to complete ADL tasks as independently as possible  OT Frequency: Min 2X/week   Barriers to D/C:  (none known at this time)          End of Session Equipment Utilized During Treatment: Gait belt Nurse Communication:  (recommendation of thigh high TEDs and pts BP readings)  Activity Tolerance: Other (comment) (limited secondary to orthostatis and symptoms) Patient left: in bed;with nursing/sitter in room;with family/visitor present   Time: 0277-4128 OT Time Calculation (min): 27 min Charges:  OT General Charges $OT Visit: 1 Procedure OT Evaluation $Initial OT Evaluation Tier I: 1 Procedure OT Treatments $Therapeutic Activity: 8-22 mins G-Codes: OT G-codes **NOT FOR INPATIENT CLASS** Functional Assessment Tool Used: Clinical Judgement Functional Limitation: Self care Self Care Current Status (N8676): At least 20 percent but less than 40 percent impaired, limited or restricted Self Care Goal Status (H2094): At least 1 percent but less than 20 percent impaired, limited or restricted  Jayro Mcmath , MS, OTR/L, CLT Pager: [phone]  07/08/2014, 3:14 PM

## 2014-07-08 NOTE — Progress Notes (Signed)
PATIENT DETAILS Name: [name] [name] Age: 73 y.o. Sex: male Date of Birth: May 11, 1941 Admit Date: 07/06/2014 Admitting Physician Rise Patience, MD PCP:No PCP Per Patient  Subjective: Patient reports difficulty in moving left arm (due to pain from fracture) but also increased swelling.  No other complaints.  He notes that his blood pressure did drop significantly on standing last night.  Assessment/Plan: Near syncope due to Orthostatic Hypotension with a history of hypertension. -secondary to orthostatic hypotension (chronic issue)-likely worsened by fistula placement, anesthesia, and recent lasix from 04/20/94 discharge -complicated medical history-s/p cardiac transplant, Stage 5 CKD-and chronic orthostatic hypotension in the setting of HTN. -Appreciate Cards and Renal consultations.   -Have changed labetalol to Metoprolol and will dose BP meds based on standing BP.  Supine BP expected to be elevated.   -Lasix discontinued for now. -Abdominal binder and TED hose are in place. -Will request a reclining wheel chair DME. -8/19 patient continues to be orthostatic and hypertensive.  Chronic kidney disease stage V -S/P left AV fistula placement 8/17 -will need optimization of antihypertensive/diuretic regimen-given severe orthostatic changes -Urine output appears decreased (in computer) will request strict Is and Os.  Patient does not show signs of uremia. -Renal on board.   Left humerus fracture -appreciate orthopedic consultation-->nonoperative management. -manage pain with acetaminophen and oxycodone -zofran PRN for nausea  Hx of cardiac transplant -Continue immunosuppressants  -Cardiology on board.  Type II or unspecified type diabetes mellitus with unspecified complication, uncontrolled -Renal/Carb-modified diet -CBG's stable-with SSI - sensitive.   Hypothyroidism -Continue Synthroid -06/21/2014 TSH 2.960  Chronic Anemia-secondary to CKD -Hgb appears to be  declining. -Follow CBCs transfuse if hgb drops below 8 (?). -Supplement with ferrous sulfate -baseline Hgb appears to be around 8  New Fistula placement (8/17) to left upper extremity. -Performed by Dr. Donnetta Hutching. -Appreciate vascular consultation (at wife's request) on 8/19 -Have asked vascular to re-round due to increased swelling & wife's concerns. -Will elevate and apply ice for comfort. -no signs of compartment syndrome  Disposition: Remain inpatient  DVT Prophylaxis: Heparin  Code Status: Full code   Family Communication Wife at bedside.  Procedures:  None  CONSULTS:  Nephrology  Cardiology  Orthopedic Surgery  Vascular Surgery.  Time spent 40 minutes-which includes 50% of the time with face-to-face with patient/ family and coordinating care related to the above assessment and plan.  MEDICATIONS: Scheduled Meds: . aspirin  325 mg Oral QHS  . calcitRIOL  0.25 mcg Oral Q M,W,F  . cycloSPORINE modified  25 mg Oral QHS  . cycloSPORINE modified  50 mg Oral Daily  . feeding supplement (NEPRO CARB STEADY)  237 mL Oral BID BM  . ferrous sulfate  325 mg Oral TID WC  . FLUoxetine  20 mg Oral Q breakfast  . heparin subcutaneous  5,000 Units Subcutaneous 3 times per day  . insulin aspart  0-9 Units Subcutaneous TID WC  . levothyroxine  75 mcg Oral QAC breakfast  . metoprolol tartrate  25 mg Oral BID  . mycophenolate  250 mg Oral QHS  . mycophenolate  500 mg Oral Daily  . pantoprazole  40 mg Oral Daily  . saccharomyces boulardii  250 mg Oral QODAY  . simvastatin  5 mg Oral q1800  . sodium bicarbonate  650 mg Oral TID  . sodium chloride  3 mL Intravenous Q12H  . vitamin B-12  1,000 mcg Oral Q breakfast   Continuous Infusions:  PRN Meds:.acetaminophen, acetaminophen, hydrALAZINE, ondansetron (  ZOFRAN) IV, ondansetron, oxyCODONE  Antibiotics: Anti-infectives   None     PHYSICAL EXAM: Vital signs in last 24 hours: Filed Vitals:   07/07/14 2051 07/08/14 0500  07/08/14 0710 07/08/14 0850  BP: 174/72 183/63 167/63 181/72  Pulse: 80 76  80  Temp: 98.3 F (36.8 C) 98.2 F (36.8 C)    TempSrc: Oral Oral    Resp: 20 18    Height:      Weight:  67.767 kg (149 lb 6.4 oz)    SpO2: 99% 100%      Weight change: 5.625 kg (12 lb 6.4 oz) Filed Weights   07/06/14 2143 07/07/14 0630 07/08/14 0500  Weight: 61.5 kg (135 lb 9.3 oz) 68.2 kg (150 lb 5.7 oz) 67.767 kg (149 lb 6.4 oz)   Body mass index is 19.17 kg/(m^2).   Gen Exam: Awake and alert with clear speech. Sitting up in bed with left arm in sling. Neck: Supple, No JVD.   Chest: B/L Clear. No wheezes, rales or rhonci CVS: S1 S2 Regular, no murmurs, gallops or rubs Abdomen: soft, BS +, non tender, non distended.  Extremities: Left upper extremity swollen with ecchymosis from shoulder to elbow.  New fistula site with out infection or drainage.  Lower extremities warm to touch. AV Fistula in left arm.   Neurologic: Non Focal.    Intake/Output from previous day:  Intake/Output Summary (Last 24 hours) at 07/08/14 1313 Last data filed at 07/08/14 0507  Gross per 24 hour  Intake    627 ml  Output    900 ml  Net   -273 ml    LAB RESULTS: CBC  Recent Labs Lab 07/06/14 0557 07/06/14 1424 07/07/14 0500 07/08/14 0814  WBC  --  10.4 5.8 6.4  HGB 11.9* 10.3* 8.6* 8.2*  HCT 35.0* 32.1* 26.8* 24.9*  PLT  --  270 237 204  MCV  --  94.4 96.1 93.3  MCH  --  30.3 30.8 30.7  MCHC  --  32.1 32.1 32.9  RDW  --  14.7 14.8 14.8   Chemistries   Recent Labs Lab 07/06/14 0557 07/06/14 1424 07/06/14 1541 07/07/14 0500 07/08/14 0814  NA 138 139  --  142 142  K 3.7 3.4*  --  3.7 3.8  CL  --  98  --  104 102  CO2  --  22  --  21 21  GLUCOSE 134* 185*  --  94 106*  BUN  --  78*  --  76* 84*  CREATININE  --  6.39*  --  6.05* 6.14*  CALCIUM  --  8.8  --  8.3* 8.2*  MG  --   --  1.6  --   --    CBG:  Recent Labs Lab 07/07/14 1142 07/07/14 1655 07/07/14 2143 07/08/14 0746 07/08/14 1152    GLUCAP 152* 150* 135* 118* 161*   GFR Estimated Creatinine Clearance: 10.3 ml/min (by C-G formula based on Cr of 6.14).  Cardiac Enzymes  Recent Labs Lab 07/06/14 1424  TROPONINI <0.30    RADIOLOGY STUDIES/RESULTS: Dg Chest 2 View 2014-07-17   CLINICAL DATA:  Preoperative evaluation for shunt creation in arm, history hypertension, diabetes, chronic kidney disease, heart transplant  EXAM: CHEST  2 VIEW  COMPARISON:  None  FINDINGS: Post median sternotomy and by history heart transplantation.  Atherosclerotic calcification aorta.  Mediastinal contours with pulmonary vascularity normal.  Atelectasis and/or scarring in RIGHT middle lobe with volume loss.  Underlying emphysematous changes.  Remaining lungs clear.  No infiltrate, pleural effusion or pneumothorax.  No acute osseous findings.  IMPRESSION: Post heart transplant.  COPD changes with atelectasis and/or scarring in RIGHT middle lobe.   Electronically Signed   By: Lavonia Dana M.D.   On: 07/02/2014 16:10   US Renal 06/21/2014   CLINICAL DATA:  Acute on chronic renal failure  EXAM: RENAL/URINARY TRACT ULTRASOUND COMPLETE  COMPARISON:  None.  FINDINGS: Right Kidney:  Length: 11.0 cm. 8 mm interpolar calculus. No mass or hydronephrosis.  Left Kidney:  Length: 10.0 cm.  No mass or hydronephrosis.  Bladder:  Within normal limits.  IMPRESSION: 8 mm interpolar right renal calculus.  No hydronephrosis.   Electronically Signed   By: Julian Hy M.D.   On: 06/21/2014 09:21   Dg Chest Port 1 View 07/06/2014   CLINICAL DATA:  Chest pain.  EXAM: PORTABLE CHEST - 1 VIEW  COMPARISON:  07/02/2014.  FINDINGS: The cardiac silhouette, mediastinal and hilar contours are within normal limits and stable. Stable tortuosity and calcification of the thoracic aorta. Stable surgical changes from bypass surgery. Slightly low lung volumes with mild vascular crowding and streaky atelectasis. No infiltrates, edema or effusions. Chronic appearing right-sided pleural  thickening. The bony thorax is intact.  IMPRESSION: Low lung volumes with vascular crowding and atelectasis.   Electronically Signed   By: Kalman Jewels M.D.   On: 07/06/2014 22:20   Dg Shoulder Left 07/06/2014   CLINICAL DATA:  Anterior and lateral shoulder pain.  Fall.  EXAM: LEFT SHOULDER - 2+ VIEW  COMPARISON:  None.  FINDINGS: Humeral head is located. The shoulder is internally rotated and there is a nondisplaced fracture of the inferior aspect of the greater tuberosity. Moderate AC joint osteoarthritis. Scapula appears intact. Surgical clips are present in the upper arm.  There appears to be a transverse nondisplaced fracture plane through the proximal LEFT humerus. The fracture fragments are not displaced and the radiographic appearance is most compatible with a 1 part fracture.  IMPRESSION: Nondisplaced fracture of the proximal humerus. Noncontrast CT recommended for further characterization because the arm is internally rotated on these radiographs.   Electronically Signed   By: Dereck Ligas M.D.   On: 07/06/2014 18:21    Ruthine Dose [facility] Pager:[phone]  If 7PM-7AM, please contact night-coverage [email or web address] Password [email or web address] 07/08/2014, 1:13 PM   LOS: 2 days   **Disclaimer: This note may have been dictated with voice recognition software. Similar sounding words can inadvertently be transcribed and this note may contain transcription errors which may not have been corrected upon publication of note.**  Attending  Patient was seen, examined,treatment plan was discussed with the Physician extender. I have directly reviewed the clinical findings, lab, imaging studies and management of this patient in detail. I have made the necessary changes to the above noted documentation, and agree with the documentation, as recorded by the Physician extender.  Orson Eva, DO [phone]

## 2014-07-08 NOTE — Evaluation (Addendum)
Physical Therapy Evaluation Patient Details Name: [name] [name] MRN: [id number] DOB: July 13, 1941 Today's Date: 07/08/2014   History of Present Illness  Patient is a 73 y/o male with history of cardiac transplant in 2001, CKD stage V who had left AV fistula placed 8/17. Pt d/ced home, felt dizzy and s/p fall ambulating into home. Did not hit his head or lose consciousness but hurt his left shoulder. Shoulder XRAY-nondisplaced fx proximal humerus. Abdominal binder ordered. PMH positive for HTN, MI, CVA, and DM.   Clinical Impression  Patient presents with functional limitations due to deficits listed in PT problem list (see below). Pt with symptomatic orthostatic hypotension worsened in standing position. BP did not improve with abdominal binder donned. Pt requires assist with bed mobility and transfers due to immobilized LUE, weakness and pain. Education provided on importance of sitting upright to tolerance for acclimation of BP. Pt would benefit from acute PT for further assessment of functional mobility and to improve safe mobility so appropriate disposition recommendations and equipment can be made prior to discharge. Anticipate pt will need 24/7 supervision/assist at home due to not being able to use RW. See orthostatics in following note.   Follow Up Recommendations Supervision/Assistance - 24 hour;Home health PT    Equipment Recommendations  None recommended by PT (TBD pending progress.)    Recommendations for Other Services       Precautions / Restrictions Precautions Precautions: Fall Precaution Comments: orthostatic hypotension; abdominal binder, ted hose. Monitor BP. Sling LUE secondary to humerus fx. No WB status noted in chart. Required Braces or Orthoses: Sling Restrictions Weight Bearing Restrictions: No      Mobility  Bed Mobility Overal bed mobility: Needs Assistance Bed Mobility: Rolling;Sidelying to Sit;Sit to Supine Rolling: Min assist (Able to roll to R/L to  donn abdominal binder with Min A) Sidelying to sit: Mod assist;HOB elevated   Sit to supine: Mod assist   General bed mobility comments: Supine to/from sit x2 secondary to doing orthostatics. Required VC to use RUE to reach for hand rail. Assist scooting bottom to EOB and to get trunk to upright. Able to bring BLEs into bed, assist with lowering trunk into bed and with repositioning with bed in trendelenburg. Dizziness present upon sitting EOB. Orthostatics taken. See note.  Transfers Overall transfer level: Needs assistance Equipment used: Rolling walker (2 wheeled) Transfers: Sit to/from Stand Sit to Stand: Min assist         General transfer comment: Min A due to weakness and inability to use LUE to assist with transfer. RW used for RUE support upon standing. Dizziness, blurry vision noted upon standing. Pt requesting to sit down after a few seconds.  Ambulation/Gait             General Gait Details: Gait TBA as pt not able to tolerate standing position secondary to orthostatic hypotension and increased symptoms- dizziness, blurry vision.  Stairs            Wheelchair Mobility    Modified Rankin (Stroke Patients Only)       Balance Overall balance assessment: Needs assistance;History of Falls Sitting-balance support: Single extremity supported Sitting balance-Leahy Scale: Fair Sitting balance - Comments: Pt became symptomatic sitting EOB with complaints of dizziness and light headedness. Able to sit with UE support, no physical assist required. Symptoms did not resolve with increased time. Postural control: Right lateral lean Standing balance support: Single extremity supported Standing balance-Leahy Scale: Poor Standing balance comment: Not able to tolerate standing secondary to worsening of  symptoms. Able to stand <30 sec with RUE supported on RW, however needed to sit immediately due to blurry vision, dizziness.                              Pertinent Vitals/Pain Pain Assessment: 0-10 Pain Score:  (Not rated on pain scale.) Pain Location: LUE with movement. Pain Descriptors / Indicators: Sore;Discomfort;Guarding Pain Intervention(s): Monitored during session;Repositioned    Home Living Family/patient expects to be discharged to:: Private residence Living Arrangements: Spouse/significant other Available Help at Discharge: Family Type of Home: Apartment Home Access: Stairs to enter Entrance Stairs-Rails: Right Entrance Stairs-Number of Steps: 3 wide steps Home Layout: One level Home Equipment: Walker - 4 wheels;Cane - single point Additional Comments: Just moved from [facility].    Prior Function Level of Independence: Independent with assistive device(s)         Comments: Uses RW vs SPC for ambulation. Independent with ADLs.     Hand Dominance        Extremity/Trunk Assessment   Upper Extremity Assessment: Defer to OT evaluation;LUE deficits/detail       LUE Deficits / Details: LUE in sling. Wrist and digit AROM WFL. Elbow and shoulder strength/AROM not assessed secondary to nondisplaced humerus fx.   Lower Extremity Assessment: Generalized weakness         Communication   Communication: No difficulties  Cognition Arousal/Alertness: Awake/alert Behavior During Therapy: WFL for tasks assessed/performed Overall Cognitive Status: Within Functional Limits for tasks assessed                      General Comments General comments (skin integrity, edema, etc.): Took orthostatics without abdominal binder and then with abdominal binder. See additional note.    Exercises Other Exercises Other Exercises: Performed punches with RUE x20 and shoulder elevation with elbow in flexion raising RUE towards ceiling x20 to assist with increasing BP. Other Exercises: Performed SLR x5 BLEs in supine.      Assessment/Plan    PT Assessment Patient needs continued PT services  PT Diagnosis Difficulty  walking;Generalized weakness;Acute pain   PT Problem List Decreased strength;Cardiopulmonary status limiting activity;Pain;Decreased activity tolerance;Decreased knowledge of use of DME;Decreased balance;Decreased safety awareness;Decreased mobility;Decreased knowledge of precautions  PT Treatment Interventions DME instruction;Balance training;Gait training;Stair training;Patient/family education;Functional mobility training;Therapeutic activities;Therapeutic exercise   PT Goals (Current goals can be found in the Care Plan section) Acute Rehab PT Goals Patient Stated Goal: to be able to walk without feeling dizzy. PT Goal Formulation: With patient/family Time For Goal Achievement: 07/22/14 Potential to Achieve Goals: Good    Frequency Min 3X/week   Barriers to discharge        Co-evaluation Orthostatics performed WITHOUT abdominal binder Supine BP: 144/68  HR 85 bpm  Sa02 100% on RA Sitting BP: 112/65  HR 88 bpm  Sa02 98% on RA Standing BP: 96/61  HR 88 bpm  Sa02 98% on RA   Orthostatic performed WITH abdominal binder donned Supine BP: 157/68  HR 81 bpm  Sa02 99% on RA Sitting BP: 108/66  HR 82 bpm  Sa02 99% on RA Not able to tolerate standing due to increase symptoms of dizziness.  Symptoms of dizziness, blurry vision did not resolve when seated EOB, only when returning to supine position.           End of Session Equipment Utilized During Treatment: Gait belt Activity Tolerance: Treatment limited secondary to medical complications (Comment) (symptomatic orthostatic  hypotension.) Patient left: in bed;with call bell/phone within reach;with bed alarm set;with family/visitor present Nurse Communication: Mobility status;Precautions    Functional Assessment Tool Used: Clinical judgment. Functional Limitation: Changing and maintaining body position Changing and Maintaining Body Position Current Status [phone]): At least 40 percent but less than 60 percent impaired, limited or  restricted Changing and Maintaining Body Position Goal Status (Q3009): At least 20 percent but less than 40 percent impaired, limited or restricted    Time: 0942-1020 PT Time Calculation (min): 38 min   Charges:   PT Evaluation $Initial PT Evaluation Tier I: 1 Procedure PT Treatments $Therapeutic Activity: 23-37 mins   PT G Codes:   Functional Assessment Tool Used: Clinical judgment. Functional Limitation: Changing and maintaining body position    Candy Sledge A 07/08/2014, 10:42 AM Candy Sledge, PT, DPT [phone]

## 2014-07-08 NOTE — Progress Notes (Signed)
Patient was screened by [name] [name] for appropriateness for an Inpatient Acute Rehab consult.  Unfortunately, this patient's diagnosis is not a rehab diagnosis that will be covered by pt's insurance.  We are not recommending a rehab consult at this time.     [place] Admissions Coordinator Cell [phone] Office [phone]

## 2014-07-09 DIAGNOSIS — E039 Hypothyroidism, unspecified: Secondary | ICD-10-CM

## 2014-07-09 DIAGNOSIS — I951 Orthostatic hypotension: Secondary | ICD-10-CM | POA: Diagnosis not present

## 2014-07-09 LAB — RENAL FUNCTION PANEL
Albumin: 2.7 g/dL — ABNORMAL LOW (ref 3.5–5.2)
Anion gap: 18 — ABNORMAL HIGH (ref 5–15)
BUN: 89 mg/dL — ABNORMAL HIGH (ref 6–23)
CALCIUM: 8.4 mg/dL (ref 8.4–10.5)
CO2: 19 meq/L (ref 19–32)
Chloride: 102 mEq/L (ref 96–112)
Creatinine, Ser: 5.72 mg/dL — ABNORMAL HIGH (ref 0.50–1.35)
GFR calc Af Amer: 10 mL/min — ABNORMAL LOW (ref >90)
GFR, EST NON AFRICAN AMERICAN: 9 mL/min — AB (ref >90)
Glucose, Bld: 106 mg/dL — ABNORMAL HIGH (ref 70–99)
PHOSPHORUS: 5 mg/dL — AB (ref 2.3–4.6)
Potassium: 4.1 mEq/L (ref 3.7–5.3)
Sodium: 139 mEq/L (ref 137–147)

## 2014-07-09 LAB — CBC
HCT: 25.7 % — ABNORMAL LOW (ref 39.0–52.0)
Hemoglobin: 8.5 g/dL — ABNORMAL LOW (ref 13.0–17.0)
MCH: 31 pg (ref 26.0–34.0)
MCHC: 33.1 g/dL (ref 30.0–36.0)
MCV: 93.8 fL (ref 78.0–100.0)
PLATELETS: 205 10*3/uL (ref 150–400)
RBC: 2.74 MIL/uL — ABNORMAL LOW (ref 4.22–5.81)
RDW: 14.9 % (ref 11.5–15.5)
WBC: 7.1 10*3/uL (ref 4.0–10.5)

## 2014-07-09 LAB — GLUCOSE, CAPILLARY
GLUCOSE-CAPILLARY: 154 mg/dL — AB (ref 70–99)
Glucose-Capillary: 107 mg/dL — ABNORMAL HIGH (ref 70–99)
Glucose-Capillary: 110 mg/dL — ABNORMAL HIGH (ref 70–99)
Glucose-Capillary: 155 mg/dL — ABNORMAL HIGH (ref 70–99)
Glucose-Capillary: 110 mg/dL — ABNORMAL HIGH (ref 70–99)

## 2014-07-09 MED ORDER — METOPROLOL TARTRATE 12.5 MG HALF TABLET
12.5000 mg | ORAL_TABLET | Freq: Two times a day (BID) | ORAL | Status: DC
  Administered 2014-07-09: 12.5 mg via ORAL
  Filled 2014-07-09 (×4): qty 1

## 2014-07-09 MED ORDER — PRAVASTATIN SODIUM 10 MG PO TABS
10.0000 mg | ORAL_TABLET | Freq: Every day | ORAL | Status: DC
  Administered 2014-07-09: 10 mg via ORAL
  Filled 2014-07-09 (×2): qty 1

## 2014-07-09 NOTE — Progress Notes (Signed)
Physical Therapy Treatment Patient Details Name: [name] [name] MRN: [id number] DOB: Mar 29, 1941 Today's Date: 07/09/2014    History of Present Illness Patient is a 73 y/o male with history of cardiac transplant in 2001, CKD stage V who had left AV fistula placed 8/17. Pt d/ced home, felt dizzy and s/p fall ambulating into home. Did not hit his head or lose consciousness but hurt his left shoulder. Shoulder XRAY-nondisplaced fx proximal humerus. Abdominal binder ordered. PMH positive for HTN, MI, CVA, and DM.    PT Comments    Patient continues to exhibit symptomatic orthostatic hypotension today however able to tolerate standing and short distance ambulation, which is improved mobility from prior session. (+) nausea post standing. Pt requires long seated rest breaks between position changes due to dizziness/nausea. Requires assist standing from low surfaces due to inability to use LUE. Very motivated. Encouraged pt to sit up in chair as long as tolerated today to acclimate body to upright. Will continue to follow and progress as tolerated.    Follow Up Recommendations  Supervision/Assistance - 24 hour;Home health PT     Equipment Recommendations  None recommended by PT    Recommendations for Other Services       Precautions / Restrictions Precautions Precautions: Fall Precaution Comments: orthostatic hypotension; abdominal binder, ted hose. Monitor BP. Sling LUE secondary to humerus fx. No WB status noted in chart. Required Braces or Orthoses: Sling Restrictions Weight Bearing Restrictions: No    Mobility  Bed Mobility Overal bed mobility: Needs Assistance Bed Mobility: Supine to Sit     Supine to sit: Min assist;HOB elevated     General bed mobility comments: Use of rails for assist, Min A with getting trunk upright. Dizziness upon sitting EOB.  Transfers Overall transfer level: Needs assistance Equipment used: Rolling walker (2 wheeled) Transfers: Sit to/from  [facility] Sit to Stand: Mod assist;Min assist Stand pivot transfers: Min guard       General transfer comment: Min A to stand from EOB and recliner secondary to not being able to use LUE; Mod A to stand from low BSC. VC to push off of arm rest through RUE. Not able to use LUE due to being in sling. SPT bed to chair and SPT chair <--> BSC, Min guard for safety due to dizziness. Required long seated rest breaks due to symptoms of dizziness and nausea. (+) nausea post standing. See vitals in vitals section.  Ambulation/Gait Ambulation/Gait assistance: Min guard Ambulation Distance (Feet): 12 Feet Assistive device: Rolling walker (2 wheeled) Gait Pattern/deviations: Step-through pattern Gait velocity: Decreased   General Gait Details: Pt able to use RW for stability pushing with RUE, required therapist assist pushing RW on left side due to LUE in sling. Dizziness and nausea present during ambulation requiring need to sit. BP improved post ambulation bout. See BP in vitals section.   Stairs            Wheelchair Mobility    Modified Rankin (Stroke Patients Only)       Balance Overall balance assessment: Needs assistance Sitting-balance support: No upper extremity supported;Feet supported Sitting balance-Leahy Scale: Fair Sitting balance - Comments: Pt became symptomatic sitting EOB with complaints of dizziness and light headedness. Resolved minimally with increased time. Able to sit without physical assist EOB and in chair, despite symptoms of dizziness. Able to perform peri care independently.     Standing balance-Leahy Scale: Fair Standing balance comment: Able to tolerate static standing for short periods of time without support, however  for dynamic standing requires RUE support for balance and safety. Able to tolerate standing for ~2 minutes without LOB.                    Cognition Arousal/Alertness: Awake/alert Behavior During Therapy: WFL for  tasks assessed/performed Overall Cognitive Status: Within Functional Limits for tasks assessed                      Exercises      General Comments General comments (skin integrity, edema, etc.): Orthostatics taken with abdominal binder and Ted hose donned. See vitals section of note for details.      Pertinent Vitals/Pain Pain Assessment: No/denies pain    Home Living                      Prior Function            PT Goals (current goals can now be found in the care plan section) Progress towards PT goals: Progressing toward goals    Frequency       PT Plan Current plan remains appropriate    Co-evaluation             End of Session Equipment Utilized During Treatment: Gait belt Activity Tolerance: Patient tolerated treatment well (however still limited by symptomatic orthostatic hypotension) Patient left: in chair;with call bell/phone within reach;with family/visitor present     Time: 1212-1300 PT Time Calculation (min): 48 min  Charges:  $Gait Training: 8-22 mins $Therapeutic Activity: 23-37 mins                    G CodesCandy Sledge A 07/16/2014, 1:46 PM Candy Sledge, [place], DPT [phone]

## 2014-07-09 NOTE — Progress Notes (Signed)
Patient has taken off his sling, ted hose, and abd binder. Patient states that he does not want it back on tonight and will have someone help him later on when he awakes.

## 2014-07-09 NOTE — Progress Notes (Signed)
Occupational Therapy Treatment Patient Details Name: [name] [name] MRN: [id number] DOB: 1941/03/21 Today's Date: 07/09/2014    History of present illness Patient is a 73 y/o male with history of cardiac transplant in 2001, CKD stage V who had left AV fistula placed 8/17. Pt d/ced home, felt dizzy and s/p fall ambulating into home. Did not hit his head or lose consciousness but hurt his left shoulder. Shoulder XRAY-nondisplaced fx proximal humerus. Abdominal binder ordered. PMH positive for HTN, MI, CVA, and DM.   OT comments  Focus of session today on compensatory strategies for bathing and dressing given L humerus fx, sling use and positioning.  Pt routinely sits at sink at home on his shower seat to complete grooming.  Wife will supervise tub transfer at home.  Educated pt and wife on use and benefits of 3 in1 particularly over their toilet at home to increase ability to perform sit to stand. Provided and modified shoulder protocol handout to reinforce education provided today.  Follow Up Recommendations  Home health OT;Supervision/Assistance - 24 hour (CIR has declined)    Equipment Recommendations  3 in 1 bedside comode    Recommendations for Other Services      Precautions / Restrictions Precautions Precautions: Fall Precaution Comments: orthostatic hypotension; abdominal binder, ted hose. Monitor BP. Sling LUE secondary to humerus fx. No WB status noted in chart. Required Braces or Orthoses: Sling (for comfort) Restrictions Weight Bearing Restrictions: No       Mobility Bed Mobility Overal bed mobility:  (not tested, pt up in chair) Bed Mobility: Supine to Sit     Supine to sit: Min assist;HOB elevated     General bed mobility comments: Use of rails for assist, Min A with getting trunk upright. Dizziness upon sitting EOB.  Transfers Overall transfer level: Needs assistance Equipment used: None Transfers: Sit to/from [facility] Sit to Stand: Min  assist Stand pivot transfers: Min guard       General transfer comment: min assist due to inability to use L UE to push, encourage to use momentum and R UE to assist himself    Balance Overall balance assessment: Needs assistance Sitting-balance support: No upper extremity supported;Feet supported Sitting balance-Leahy Scale: Fair Sitting balance - Comments: Pt became symptomatic sitting EOB with complaints of dizziness and light headedness. Resolved minimally with increased time. Able to sit without physical assist EOB and in chair, despite symptoms of dizziness. Able to perform peri care independently.     Standing balance-Leahy Scale: Fair Standing balance comment: Able to tolerate static standing for short periods of time without support, however for dynamic standing requires RUE support for balance and safety. Able to tolerate standing for ~2 minutes without LOB.                   ADL Overall ADL's : Needs assistance/impaired                         Toilet Transfer: Minimal assistance;Stand-pivot   Toileting- Clothing Manipulation and Hygiene: Minimal assistance;Sit to/from stand       Functional mobility during ADLs: Minimal assistance General ADL Comments: Instructed pt and wife in compensatory strategies for bathing and dressing.  Instructed in sling donning and doffing, correct position and MD order for comfort only.  No weight bearing orders, but advised pt to avoid using L UE to push up from bed. Pt agreeable to wife supervising tub transfer onto shower seat.  Educated pt and  wife in use of 3 in1.      Vision                     Perception     Praxis      Cognition   Behavior During Therapy: WFL for tasks assessed/performed Overall Cognitive Status: Within Functional Limits for tasks assessed       Memory:  (hx of memory deficits per chart)               Extremity/Trunk Assessment               Exercises     Shoulder  Instructions       General Comments      Pertinent Vitals/ Pain       Pain Assessment: No/denies pain  Home Living                                          Prior Functioning/Environment              Frequency Min 2X/week     Progress Toward Goals  OT Goals(current goals can now be found in the care plan section)  Progress towards OT goals: Progressing toward goals  Acute Rehab OT Goals Patient Stated Goal: to be able to walk without feeling dizzy.  Plan Discharge plan needs to be updated    Co-evaluation                 End of Session Equipment Utilized During Treatment: Gait belt   Activity Tolerance     Patient Left in chair;with call bell/phone within reach;with family/visitor present;with nursing/sitter in room   Nurse Communication Mobility status        Time: 3419-6222 OT Time Calculation (min): 32 min  Charges: OT General Charges $OT Visit: 1 Procedure OT Treatments $Self Care/Home Management : 23-37 mins  [name] [name] 07/09/2014, 2:32 PM [phone]

## 2014-07-09 NOTE — Progress Notes (Signed)
S:Patient skipped PM dose of metoprolol last night and was still found to be severely orthostatic hypotensive this AM although was not wearing compression stockings or abdominal binder.  His metoprolol has been decreased to 12.5mg  BID.  Overall he reports he is doing better and feels fine while in bed.  He has no other complaints.  O:BP 102/62  Pulse 81  Temp(Src) 98.4 F (36.9 C) (Oral)  Resp 16  Ht 6\' 2"  (1.88 m)  Wt 148 lb 12.8 oz (67.495 kg)  BMI 19.10 kg/m2  SpO2 99%  Intake/Output Summary (Last 24 hours) at 07/09/14 0915 Last data filed at 07/09/14 0655  Gross per 24 hour  Intake    680 ml  Output    950 ml  Net   -270 ml   Intake/Output: I/O last 3 completed shifts: In: 680 [P.O.:200; NG/GT:480] Out: 1500 [Urine:1500]  Intake/Output this shift:    Weight change: -9.6 oz (-0.272 kg) YSA:YTKZSWF in bed CVS:RRR no murmur Resp:CTAB Abd:+ BS, soft non tender  UXN:ATFT arm in sling, +bruit and thrill in left upper extremity, no pedal edema   Recent Labs Lab 07/06/14 0557 07/06/14 1424 07/06/14 1541 07/07/14 0500 07/08/14 0814 07/09/14 0534  NA 138 139  --  142 142 139  K 3.7 3.4*  --  3.7 3.8 4.1  CL  --  98  --  104 102 102  CO2  --  22  --  21 21 19   GLUCOSE 134* 185*  --  94 106* 106*  BUN  --  78*  --  76* 84* 89*  CREATININE  --  6.39*  --  6.05* 6.14* 5.72*  ALBUMIN  --   --   --   --   --  2.7*  CALCIUM  --  8.8  --  8.3* 8.2* 8.4  PHOS  --   --  5.0*  --   --  5.0*   Liver Function Tests:  Recent Labs Lab 07/09/14 0534  ALBUMIN 2.7*   No results found for this basename: LIPASE, AMYLASE,  in the last 168 hours No results found for this basename: AMMONIA,  in the last 168 hours CBC:  Recent Labs Lab 07/06/14 1424 07/07/14 0500 07/08/14 0814 07/09/14 0534  WBC 10.4 5.8 6.4 7.1  HGB 10.3* 8.6* 8.2* 8.5*  HCT 32.1* 26.8* 24.9* 25.7*  MCV 94.4 96.1 93.3 93.8  PLT 270 237 204 205   Cardiac Enzymes:  Recent Labs Lab 07/06/14 1424   TROPONINI <0.30   CBG:  Recent Labs Lab 07/08/14 0746 07/08/14 1152 07/08/14 1648 07/08/14 2212 07/09/14 0752  GLUCAP 118* 161* 153* 107* 110*    Iron Studies: No results found for this basename: IRON, TIBC, TRANSFERRIN, FERRITIN,  in the last 72 hours Studies/Results: No results found. Marland Kitchen aspirin  325 mg Oral QHS  . calcitRIOL  0.25 mcg Oral Q M,W,F  . cycloSPORINE modified  25 mg Oral QHS  . cycloSPORINE modified  50 mg Oral Daily  . docusate sodium  100 mg Oral BID  . feeding supplement (NEPRO CARB STEADY)  237 mL Oral BID BM  . ferrous sulfate  325 mg Oral TID WC  . FLUoxetine  20 mg Oral Q breakfast  . heparin subcutaneous  5,000 Units Subcutaneous 3 times per day  . insulin aspart  0-9 Units Subcutaneous TID WC  . levothyroxine  75 mcg Oral QAC breakfast  . metoprolol tartrate  12.5 mg Oral BID  . mycophenolate  250 mg  Oral QHS  . mycophenolate  500 mg Oral Daily  . pantoprazole  40 mg Oral Daily  . saccharomyces boulardii  250 mg Oral QODAY  . senna  2 tablet Oral Daily  . simvastatin  5 mg Oral q1800  . sodium bicarbonate  650 mg Oral TID  . sodium chloride  3 mL Intravenous Q12H  . vitamin B-12  1,000 mcg Oral Q breakfast    BMET    Component Value Date/Time   NA 139 07/09/2014 0534   K 4.1 07/09/2014 0534   CL 102 07/09/2014 0534   CO2 19 07/09/2014 0534   GLUCOSE 106* 07/09/2014 0534   BUN 89* 07/09/2014 0534   CREATININE 5.72* 07/09/2014 0534   CALCIUM 8.4 07/09/2014 0534   GFRNONAA 9* 07/09/2014 0534   GFRAA 10* 07/09/2014 0534   CBC    Component Value Date/Time   WBC 7.1 07/09/2014 0534   RBC 2.74* 07/09/2014 0534   HGB 8.5* 07/09/2014 0534   HCT 25.7* 07/09/2014 0534   PLT 205 07/09/2014 0534   MCV 93.8 07/09/2014 0534   MCH 31.0 07/09/2014 0534   MCHC 33.1 07/09/2014 0534   RDW 14.9 07/09/2014 0534     Assessment/Plan:  1. 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 2. Orthostatic Hypotension- hold  lasix, compression stockings and abdominal binder.  Titration of medication based on standing BP.  Patient had severely orthostatic vital signs this AM but was not using compression stockings or abdominal binder.  Would repeat this later today with compression stockings and binder on.   3. Anemia in CKD 4. Hx of Cardiac Transplant- continue home medications 5. AYT:KZSWFUXNAT 12.5mg  BID, will need to titrate based on standing BP as able, allow for higher supine BP (557-322 systolic) 6. Left non displaced humerus fracture: ortho saw inpatient, conservative therapy  Lucious Groves Internal Medicine PGY2  Pager [phone]  I have seen and examined this patient and agree with plan as outlined by Dr. Heber Altamont. Prestina Raigoza A,MD 07/10/2014 12:32 PM

## 2014-07-09 NOTE — Progress Notes (Addendum)
PATIENT DETAILS Name: [name] [name] Age: 73 y.o. Sex: male Date of Birth: 14-May-1941 Admit Date: 07/06/2014 Admitting Physician Rise Patience, MD PCP:No PCP Per Patient  Subjective: Reports feeling better today.  Assessment/Plan: Near syncope due to Orthostatic Hypotension with a history of hypertension. -still a little dizzy but feeling better -secondary to orthostatic hypotension (chronic issue)-likely worsened by fistula placement, anesthesia, and recent lasix from 05/22/52 discharge -complicated medical history-s/p cardiac transplant, Stage 5 CKD-and chronic orthostatic hypotension in the setting of HTN. -Appreciate Cards and Renal consultations.   -Have changed labetalol to Metoprolol and will dose BP meds based on standing BP.  Metoprolol dose is 12.5 BID. -Supine BP expected to be elevated.   -give metoprolol 12.5mg  as long as standing SBP >109 -Lasix discontinued for now. -Abdominal binder and TED hose are in place. -Will request a reclining wheel chair DME. -8/20 patient continues to be orthostatic and hypertensive, but SBP is improving.   -Repeat PT evaluation is improved.  Patient able to stand and ambulate a short distance.  Chronic kidney disease stage V -S/P left AV fistula placement 8/17 -will need optimization of antihypertensive/diuretic regimen-given severe orthostatic changes -Urine output appears decreased (in computer) will request strict Is and Os.  Patient does not show signs of uremia. -Renal on board.   Left humerus fracture -appreciate orthopedic consultation-->nonoperative management. -manage pain with acetaminophen and oxycodone -zofran PRN for nausea  Hx of cardiac transplant -Continue immunosuppressants  -Cardiology on board.  Type II or unspecified type diabetes mellitus with unspecified complication, uncontrolled -Renal/Carb-modified diet -CBG's stable-with SSI - sensitive.   Hypothyroidism -Continue Synthroid -06/21/2014  TSH 2.960  Chronic Anemia-secondary to CKD -Hgb appears to be declining. -Follow CBCs transfuse if hgb drops below 8 (?). -Supplement with ferrous sulfate -baseline Hgb appears to be around 8  New Fistula placement (8/17) to left upper extremity. -Performed by Dr. Donnetta Hutching. -Appreciate vascular consultation (at wife's request) on 8/19 -Have asked vascular to re-round due to increased swelling & wife's concerns. -Will elevate and apply ice for comfort. -no signs of compartment syndrome  Disposition: Plan home 07/10/14 if clinically stable and able to tolerate standing  DVT Prophylaxis: Heparin  Code Status: Full code   Family Communication Wife at bedside.  Procedures:  None  CONSULTS:  Nephrology  Cardiology  Orthopedic Surgery  Vascular Surgery.  Time spent 40 minutes-which includes 50% of the time with face-to-face with patient/ family and coordinating care related to the above assessment and plan.  MEDICATIONS: Scheduled Meds: . aspirin  325 mg Oral QHS  . calcitRIOL  0.25 mcg Oral Q M,W,F  . cycloSPORINE modified  25 mg Oral QHS  . cycloSPORINE modified  50 mg Oral Daily  . docusate sodium  100 mg Oral BID  . feeding supplement (NEPRO CARB STEADY)  237 mL Oral BID BM  . ferrous sulfate  325 mg Oral TID WC  . FLUoxetine  20 mg Oral Q breakfast  . heparin subcutaneous  5,000 Units Subcutaneous 3 times per day  . insulin aspart  0-9 Units Subcutaneous TID WC  . levothyroxine  75 mcg Oral QAC breakfast  . metoprolol tartrate  12.5 mg Oral BID  . mycophenolate  250 mg Oral QHS  . mycophenolate  500 mg Oral Daily  . pantoprazole  40 mg Oral Daily  . pravastatin  10 mg Oral Daily  . saccharomyces boulardii  250 mg Oral QODAY  . senna  2 tablet Oral Daily  .  sodium bicarbonate  650 mg Oral TID  . sodium chloride  3 mL Intravenous Q12H  . vitamin B-12  1,000 mcg Oral Q breakfast   Continuous Infusions:  PRN Meds:.acetaminophen, acetaminophen, hydrALAZINE,  ondansetron (ZOFRAN) IV, ondansetron, oxyCODONE  Antibiotics: Anti-infectives   None     PHYSICAL EXAM: Vital signs in last 24 hours: Filed Vitals:   07/08/14 2217 07/09/14 0414 07/09/14 0600 07/09/14 1357  BP: 102/62   168/75  Pulse:    80  Temp: 98.7 F (37.1 C)  98.4 F (36.9 C) 97.7 F (36.5 C)  TempSrc: Oral  Oral Oral  Resp: 18  16 16   Height:      Weight:  67.495 kg (148 lb 12.8 oz)    SpO2: 99%   100%    Weight change: -0.272 kg (-9.6 oz) Filed Weights   07/07/14 0630 07/08/14 0500 07/09/14 0414  Weight: 68.2 kg (150 lb 5.7 oz) 67.767 kg (149 lb 6.4 oz) 67.495 kg (148 lb 12.8 oz)   Body mass index is 19.1 kg/(m^2).   Gen Exam: Awake and alert with clear speech. Sitting up in bed with left arm in sling. Neck: Supple, No JVD.   Chest: B/L Clear. No wheezes, rales or rhonci CVS: S1 S2 Regular, no murmurs, gallops or rubs Abdomen: soft, BS +, non tender, non distended.  Extremities: Left upper extremity swollen with ecchymosis from shoulder to elbow.  New fistula site with out infection or drainage.  Lower extremities warm to touch. AV Fistula in left arm.   Neurologic: Non Focal.    Intake/Output from previous day:  Intake/Output Summary (Last 24 hours) at 07/09/14 1629 Last data filed at 07/09/14 0655  Gross per 24 hour  Intake    200 ml  Output    525 ml  Net   -325 ml    LAB RESULTS: CBC  Recent Labs Lab 07/06/14 0557 07/06/14 1424 07/07/14 0500 07/08/14 0814 07/09/14 0534  WBC  --  10.4 5.8 6.4 7.1  HGB 11.9* 10.3* 8.6* 8.2* 8.5*  HCT 35.0* 32.1* 26.8* 24.9* 25.7*  PLT  --  270 237 204 205  MCV  --  94.4 96.1 93.3 93.8  MCH  --  30.3 30.8 30.7 31.0  MCHC  --  32.1 32.1 32.9 33.1  RDW  --  14.7 14.8 14.8 14.9   Chemistries   Recent Labs Lab 07/06/14 0557 07/06/14 1424 07/06/14 1541 07/07/14 0500 07/08/14 0814 07/09/14 0534  NA 138 139  --  142 142 139  K 3.7 3.4*  --  3.7 3.8 4.1  CL  --  98  --  104 102 102  CO2  --  22  --  21  21 19   GLUCOSE 134* 185*  --  94 106* 106*  BUN  --  78*  --  76* 84* 89*  CREATININE  --  6.39*  --  6.05* 6.14* 5.72*  CALCIUM  --  8.8  --  8.3* 8.2* 8.4  MG  --   --  1.6  --   --   --    CBG:  Recent Labs Lab 07/08/14 1152 07/08/14 1648 07/08/14 2212 07/09/14 0752 07/09/14 1219  GLUCAP 161* 153* 107* 110* 154*   GFR Estimated Creatinine Clearance: 11 ml/min (by C-G formula based on Cr of 5.72).  Cardiac Enzymes  Recent Labs Lab 07/06/14 1424  TROPONINI <0.30    RADIOLOGY STUDIES/RESULTS: Dg Chest 2 View Jul 12, 2014   CLINICAL DATA:  Preoperative evaluation  for shunt creation in arm, history hypertension, diabetes, chronic kidney disease, heart transplant  EXAM: CHEST  2 VIEW  COMPARISON:  None  FINDINGS: Post median sternotomy and by history heart transplantation.  Atherosclerotic calcification aorta.  Mediastinal contours with pulmonary vascularity normal.  Atelectasis and/or scarring in RIGHT middle lobe with volume loss.  Underlying emphysematous changes.  Remaining lungs clear.  No infiltrate, pleural effusion or pneumothorax.  No acute osseous findings.  IMPRESSION: Post heart transplant.  COPD changes with atelectasis and/or scarring in RIGHT middle lobe.   Electronically Signed   By: Lavonia Dana M.D.   On: 07/02/2014 16:10   US Renal 06/21/2014   CLINICAL DATA:  Acute on chronic renal failure  EXAM: RENAL/URINARY TRACT ULTRASOUND COMPLETE  COMPARISON:  None.  FINDINGS: Right Kidney:  Length: 11.0 cm. 8 mm interpolar calculus. No mass or hydronephrosis.  Left Kidney:  Length: 10.0 cm.  No mass or hydronephrosis.  Bladder:  Within normal limits.  IMPRESSION: 8 mm interpolar right renal calculus.  No hydronephrosis.   Electronically Signed   By: Julian Hy M.D.   On: 06/21/2014 09:21   Dg Chest Port 1 View 07/06/2014   CLINICAL DATA:  Chest pain.  EXAM: PORTABLE CHEST - 1 VIEW  COMPARISON:  07/02/2014.  FINDINGS: The cardiac silhouette, mediastinal and hilar contours  are within normal limits and stable. Stable tortuosity and calcification of the thoracic aorta. Stable surgical changes from bypass surgery. Slightly low lung volumes with mild vascular crowding and streaky atelectasis. No infiltrates, edema or effusions. Chronic appearing right-sided pleural thickening. The bony thorax is intact.  IMPRESSION: Low lung volumes with vascular crowding and atelectasis.   Electronically Signed   By: Kalman Jewels M.D.   On: 07/06/2014 22:20   Dg Shoulder Left 07/06/2014   CLINICAL DATA:  Anterior and lateral shoulder pain.  Fall.  EXAM: LEFT SHOULDER - 2+ VIEW  COMPARISON:  None.  FINDINGS: Humeral head is located. The shoulder is internally rotated and there is a nondisplaced fracture of the inferior aspect of the greater tuberosity. Moderate AC joint osteoarthritis. Scapula appears intact. Surgical clips are present in the upper arm.  There appears to be a transverse nondisplaced fracture plane through the proximal LEFT humerus. The fracture fragments are not displaced and the radiographic appearance is most compatible with a 1 part fracture.  IMPRESSION: Nondisplaced fracture of the proximal humerus. Noncontrast CT recommended for further characterization because the arm is internally rotated on these radiographs.   Electronically Signed   By: Dereck Ligas M.D.   On: 07/06/2014 18:21    Ruthine Dose [facility] Pager:[phone]  If 7PM-7AM, please contact night-coverage [email or web address] Password [email or web address] 07/09/2014, 4:29 PM   LOS: 3 days   **Disclaimer: This note may have been dictated with voice recognition software. Similar sounding words can inadvertently be transcribed and this note may contain transcription errors which may not have been corrected upon publication of note.**   Attending  Patient was seen, examined,treatment plan was discussed with the Physician extender. I have directly reviewed the clinical findings, lab, imaging studies and  management of this patient in detail. I have made the necessary changes to the above noted documentation, and agree with the documentation, as recorded by the Physician extender.  Orson Eva, DO [phone]

## 2014-07-10 DIAGNOSIS — N179 Acute kidney failure, unspecified: Secondary | ICD-10-CM

## 2014-07-10 DIAGNOSIS — I951 Orthostatic hypotension: Secondary | ICD-10-CM | POA: Diagnosis not present

## 2014-07-10 DIAGNOSIS — N189 Chronic kidney disease, unspecified: Secondary | ICD-10-CM

## 2014-07-10 LAB — BASIC METABOLIC PANEL
ANION GAP: 17 — AB (ref 5–15)
BUN: 96 mg/dL — ABNORMAL HIGH (ref 6–23)
CO2: 22 meq/L (ref 19–32)
CREATININE: 5.61 mg/dL — AB (ref 0.50–1.35)
Calcium: 8.5 mg/dL (ref 8.4–10.5)
Chloride: 99 mEq/L (ref 96–112)
GFR calc Af Amer: 10 mL/min — ABNORMAL LOW (ref >90)
GFR calc non Af Amer: 9 mL/min — ABNORMAL LOW (ref >90)
GLUCOSE: 107 mg/dL — AB (ref 70–99)
Potassium: 4.4 mEq/L (ref 3.7–5.3)
Sodium: 138 mEq/L (ref 137–147)

## 2014-07-10 LAB — GLUCOSE, CAPILLARY
GLUCOSE-CAPILLARY: 103 mg/dL — AB (ref 70–99)
Glucose-Capillary: 127 mg/dL — ABNORMAL HIGH (ref 70–99)

## 2014-07-10 MED ORDER — NEPRO/CARBSTEADY PO LIQD: 237.0000 mL | Freq: Two times a day (BID) | ORAL | Status: DC

## 2014-07-10 MED ORDER — METOPROLOL TARTRATE 12.5 MG HALF TABLET: 12.5000 mg | ORAL_TABLET | Freq: Two times a day (BID) | ORAL | Status: DC

## 2014-07-10 NOTE — Discharge Summary (Signed)
Physician Discharge Summary  [name] [name]:[id number] DOB: 12/17/1940 DOA: 07/06/2014  PCP: Lucrezia Starch, MD  Admit date: 07/06/2014 Discharge date: 07/10/2014  Time spent: 60 minutes  Recommendations for Outpatient Follow-up:  1.  Severe orthostatic hypotension.  Monitor standing BP with abdominal binder and TED hose on.  We recommend not making BP medication decisions based on supine or even sitting BP. 2.  Acute on Chronic renal failure.  Fistula placed.   3.  Fractured but non displaced left shoulder.  Follow up scheduled with Dr. Ninfa Linden.   Discharge Diagnoses:  Principal Problem:   Near syncope Active Problems:   Type II or unspecified type diabetes mellitus with unspecified complication, uncontrolled   Hypothyroidism   Heart transplanted   Hypertensive heart disease   Orthostatic hypotension   Discharge Condition: stable.  Still with significant ortho stasis.  Able to ambulate and transfer.  Diet recommendation: renal diet.  Filed Weights   07/08/14 0500 07/09/14 0414 07/10/14 0653  Weight: 67.767 kg (149 lb 6.4 oz) 67.495 kg (148 lb 12.8 oz) 66.633 kg (146 lb 14.4 oz)    History of present illness at the time of admission:  [name] [name] is a 73 y.o. male with history of cardiac transplant, chronic kidney disease stage V who had left AV fistula placed today and was discharged home.  He felt dizzy after reaching home. Patient walked with help of a walker and by the time he reached the bed he fell on the floor. Did not hit his head or lose consciousness but hurt his left shoulder. In the ER patient was from be profoundly hypotensive on standing up with orthostatic changes. Initially was given 1 L normal saline bolus and patient was given orthostatic another one monitor was given by the ED physician. Patient's x-ray show left humerus fracture. Patient at this time has been admitted for observation. Patient was recently admitted for malignant hypertension another  time since patient also had orthostatic changes cardiology was consulted and 2-D echo done showed normal LV function with EF of 30-09% grade 2 diastolic dysfunction(transplanted heart). Patient otherwise denies any headache visual symptoms weakness of the upper lower extremities or any focal deficits denies any nausea vomiting abdominal pain diarrhea fever chills shortness of breath or productive cough.    Hospital Course:  Near syncope due to Orthostatic Hypotension with a history of hypertension.    -secondary to orthostatic hypotension (chronic issue)-likely worsened by fistula placement, anesthesia, and recent lasix from 06/28/14 discharge   -Have changed labetalol to Metoprolol and will dose BP meds based on standing BP. Metoprolol dose is 12.5 BID.  -Supine BP expected to be elevated.  -give metoprolol 12.5mg  as long as standing SBP >109 (with abdominal binder and TED hose on) -Lasix discontinued  -Abdominal binder and TED hose are in place.  -Appreciate Cards and Renal consultations. -at the time of discharge, patient is still clearly orthostatic when standing but is able to ambulate a few feet without symptoms.  Chronic kidney disease stage V  -S/P left AV fistula placement 8/17  -Patient does not show signs of uremia.  -Renal followed him thru out this hospitalization.  Follow up is scheduled with Dr. Lorrene Reid.  Left humerus fracture  -appreciate orthopedic consultation-->nonoperative management.  -manage pain with acetaminophen and oxycodone  -Follow up with Dr. Ninfa Linden in 1 month. -Home health PT ordered.  Hx of cardiac transplant  -Continue immunosuppressants  -Cardiology consulted this hospitalization.  The patient plans to follow up with Dr. Stanford Breed post discharge.  Type II or unspecified type diabetes mellitus with unspecified complication, uncontrolled  -Renal/Carb-modified diet  -Hgb A1C was 6.3 06/21/2014.  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  -Supplement with ferrous sulfate  -baseline Hgb appears to be around 8  -currently stable.  New Fistula placement (8/17) to left upper extremity.  -Performed by Dr. Donnetta Hutching.  -Appreciate vascular consultation (at wife's request) on 8/19  -Will elevate and apply ice for comfort.  -Some swelling and bruising (normal per VVS).  No signs of compartment syndrome    Procedures:  Fistula placement in left arm just prior to admission.   Discharge Exam: Filed Vitals:   07/09/14 1357 07/09/14 2333 07/10/14 0156 07/10/14 0653  BP: 168/75 195/83 158/79   Pulse: 80 80    Temp: 97.7 F (36.5 C) 97.8 F (36.6 C)  98.5 F (36.9 C)  TempSrc: Oral Oral  Oral  Resp: 16 16    Height:      Weight:    66.633 kg (146 lb 14.4 oz)  SpO2: 100% 99%  100%   Orthostatics on the day of d/c Lying 176/69, Sitting 164/84, standing 93/57.   Metoprolol not given.  Patient much improved.  Gen Exam: Awake and alert with clear speech. Very pleasant.  Wife at bedside. Neck: Supple, No JVD.  Chest: B/L Clear. No wheezes, rales or rhonci  CVS: S1 S2 Regular, no murmurs, gallops or rubs  Abdomen: soft, BS +, non tender, non distended.  Extremities: Left upper extremity less swollen with less ecchymosis from shoulder to elbow. New fistula site with out infection or drainage. Lower extremities warm to touch.  AV Fistula in left arm.  Neurologic: Non Focal.    Discharge Instructions  Discharge Instructions   Increase activity slowly    Complete by:  As directed             Medication List    STOP taking these medications       furosemide 40 MG tablet  Commonly known as:  LASIX     labetalol 100 MG tablet  Commonly known as:  NORMODYNE      TAKE these medications       acetaminophen 500 MG tablet  Commonly known as:  TYLENOL  Take 1,000 mg by mouth every 6 (six) hours as needed for mild pain.     aspirin 325 MG tablet  Take  325 mg by mouth at bedtime.     calcitRIOL 0.25 MCG capsule  Commonly known as:  ROCALTROL  Take 0.25 mcg by mouth every Monday, Wednesday, and Friday.     cycloSPORINE modified 25 MG capsule  Commonly known as:  NEORAL  Take 25-50 mg by mouth 2 (two) times daily. 50 mg in the morning and 25 mg at night     feeding supplement (NEPRO CARB STEADY) Liqd  Take 237 mLs by mouth 2 (two) times daily between meals.     ferrous sulfate 325 (65 FE) MG tablet  Take 325 mg by mouth 3 (three) times daily with meals.     FLUoxetine 20 MG capsule  Commonly known as:  PROZAC  Take 20 mg by mouth daily with breakfast.     lansoprazole 15 MG capsule  Commonly known as:  PREVACID  Take 30 mg by mouth 2 (two) times daily.     levothyroxine 75 MCG tablet  Commonly known as:  SYNTHROID, LEVOTHROID  Take 75  mcg by mouth daily before breakfast.     metoprolol tartrate 12.5 mg Tabs tablet  Commonly known as:  LOPRESSOR  Take 0.5 tablets (12.5 mg total) by mouth 2 (two) times daily if Standing SBP is 110 or greater.     mycophenolate 250 MG capsule  Commonly known as:  CELLCEPT  Take 250-500 mg by mouth 2 (two) times daily. Takes 500mg  in the morning and 250mg  in the evening     ondansetron 8 MG disintegrating tablet  Commonly known as:  ZOFRAN ODT  Take 1 tablet (8 mg total) by mouth every 8 (eight) hours as needed for nausea or vomiting.     oxyCODONE 5 MG immediate release tablet  Commonly known as:  ROXICODONE  Take 1 tablet (5 mg total) by mouth every 6 (six) hours as needed for severe pain.     pravastatin 10 MG tablet  Commonly known as:  PRAVACHOL  Take 10 mg by mouth daily with breakfast.     saccharomyces boulardii 250 MG capsule  Commonly known as:  FLORASTOR  Take 250 mg by mouth every other day.     sodium bicarbonate 650 MG tablet  Take 650 mg by mouth 3 (three) times daily.     vitamin B-12 1000 MCG tablet  Commonly known as:  CYANOCOBALAMIN  Take 1,000 mcg by mouth  daily with breakfast.       Allergies  Allergen Reactions  . Norvasc [Amlodipine Besylate] Swelling    Swelling of the lower body    Follow-up Information   Follow up with DUNHAM,CYNTHIA B, MD. Schedule an appointment as soon as possible for a visit on 07/21/2014. (Appointment with Dr. Lorrene Reid is September 1 at 10:30)    Specialty:  Nephrology   Contact information:   [place] [address] [phone]       Follow up with Mcarthur Rossetti, MD. Schedule an appointment as soon as possible for a visit on 07/28/2014. (Appointment with Dr. Ninfa Linden is on 07/28/14 at 08:45)    Specialty:  Orthopedic Surgery   Contact information:   [place] [address] [phone]       Follow up with Kirk Ruths, MD. Schedule an appointment as soon as possible for a visit in 1 month.   Specialty:  Cardiology   Contact information:   [address] [place] [address] [phone]       Follow up with EARLY, TODD, MD. Schedule an appointment as soon as possible for a visit in 1 month.   Specialty:  Vascular Surgery   Contact information:   [address] [place] [address] [phone]        The results of significant diagnostics from this hospitalization (including imaging, microbiology, ancillary and laboratory) are listed below for reference.    Significant Diagnostic Studies: Dg Chest 2 View  07/02/2014   CLINICAL DATA:  Preoperative evaluation for shunt creation in arm, history hypertension, diabetes, chronic kidney disease, heart transplant  EXAM: CHEST  2 VIEW  COMPARISON:  None  FINDINGS: Post median sternotomy and by history heart transplantation.  Atherosclerotic calcification aorta.  Mediastinal contours with pulmonary vascularity normal.  Atelectasis and/or scarring in RIGHT middle lobe with volume loss.  Underlying emphysematous changes.  Remaining lungs clear.  No infiltrate, pleural effusion or pneumothorax.  No acute  osseous findings.  IMPRESSION: Post heart transplant.  COPD changes with atelectasis and/or scarring in RIGHT middle lobe.   Electronically Signed   By:  Lavonia Dana M.D.   On: 07/02/2014 16:10   US Renal  06/21/2014   CLINICAL DATA:  Acute on chronic renal failure  EXAM: RENAL/URINARY TRACT ULTRASOUND COMPLETE  COMPARISON:  None.  FINDINGS: Right Kidney:  Length: 11.0 cm. 8 mm interpolar calculus. No mass or hydronephrosis.  Left Kidney:  Length: 10.0 cm.  No mass or hydronephrosis.  Bladder:  Within normal limits.  IMPRESSION: 8 mm interpolar right renal calculus.  No hydronephrosis.   Electronically Signed   By: Julian Hy M.D.   On: 06/21/2014 09:21   Dg Chest Port 1 View  07/06/2014   CLINICAL DATA:  Chest pain.  EXAM: PORTABLE CHEST - 1 VIEW  COMPARISON:  07/02/2014.  FINDINGS: The cardiac silhouette, mediastinal and hilar contours are within normal limits and stable. Stable tortuosity and calcification of the thoracic aorta. Stable surgical changes from bypass surgery. Slightly low lung volumes with mild vascular crowding and streaky atelectasis. No infiltrates, edema or effusions. Chronic appearing right-sided pleural thickening. The bony thorax is intact.  IMPRESSION: Low lung volumes with vascular crowding and atelectasis.   Electronically Signed   By: Kalman Jewels M.D.   On: 07/06/2014 22:20   Dg Shoulder Left  07/06/2014   CLINICAL DATA:  Anterior and lateral shoulder pain.  Fall.  EXAM: LEFT SHOULDER - 2+ VIEW  COMPARISON:  None.  FINDINGS: Humeral head is located. The shoulder is internally rotated and there is a nondisplaced fracture of the inferior aspect of the greater tuberosity. Moderate AC joint osteoarthritis. Scapula appears intact. Surgical clips are present in the upper arm.  There appears to be a transverse nondisplaced fracture plane through the proximal LEFT humerus. The fracture fragments are not displaced and the radiographic appearance is most compatible with a 1 part  fracture.  IMPRESSION: Nondisplaced fracture of the proximal humerus. Noncontrast CT recommended for further characterization because the arm is internally rotated on these radiographs.   Electronically Signed   By: Dereck Ligas M.D.   On: 07/06/2014 18:21    Labs: Basic Metabolic Panel:  Recent Labs Lab 07/06/14 1424 07/06/14 1541 07/07/14 0500 07/08/14 0814 07/09/14 0534 07/10/14 0556  NA 139  --  142 142 139 138  K 3.4*  --  3.7 3.8 4.1 4.4  CL 98  --  104 102 102 99  CO2 22  --  21 21 19 22   GLUCOSE 185*  --  94 106* 106* 107*  BUN 78*  --  76* 84* 89* 96*  CREATININE 6.39*  --  6.05* 6.14* 5.72* 5.61*  CALCIUM 8.8  --  8.3* 8.2* 8.4 8.5  MG  --  1.6  --   --   --   --   PHOS  --  5.0*  --   --  5.0*  --    Liver Function Tests:  Recent Labs Lab 07/09/14 0534  ALBUMIN 2.7*   CBC:  Recent Labs Lab 07/06/14 0557 07/06/14 1424 07/07/14 0500 07/08/14 0814 07/09/14 0534  WBC  --  10.4 5.8 6.4 7.1  HGB 11.9* 10.3* 8.6* 8.2* 8.5*  HCT 35.0* 32.1* 26.8* 24.9* 25.7*  MCV  --  94.4 96.1 93.3 93.8  PLT  --  270 237 204 205   Cardiac Enzymes:  Recent Labs Lab 07/06/14 1424  TROPONINI <0.30   : CBG:  Recent Labs Lab 07/09/14 1219 07/09/14 1710 07/09/14 2329 07/10/14 0740 07/10/14 1124  GLUCAP 154* 155* 110* 103* 127*  SignedKaren Kitchens [phone]  [facility] 07/10/2014, 12:04 PM  Attending  Patient was seen, examined,treatment plan was discussed with the Physician extender. I have directly reviewed the clinical findings, lab, imaging studies and management of this patient in detail. I have made the necessary changes to the above noted documentation, and agree with the documentation, as recorded by the Physician extender.  Orson Eva, DO [phone]

## 2014-07-10 NOTE — Clinical Social Work Note (Signed)
CSW has arranged non-emergent ambulance transport for patient home. Address confirmed with wife and patient. DC packet on chart. CSW signing off at this time.   Liz Beach MSW, [place], [place], [phone]

## 2014-07-10 NOTE — Discharge Instructions (Signed)
You can come out of your left arm sling as comfort allows and to occasionally move your elbow and wrist. No overhead lifting or reaching with your left arm/shoulder.  Please check your BP with abdominal binder and TED hose on.  If your standing SBP is above 110 you may take your metoprolol.  If it is below 110 you do not need BP medication.

## 2014-07-10 NOTE — Progress Notes (Signed)
S: Orthostatics checked this morning still positive but improved and he notes that he was able to stand without symptoms. O:BP 158/79  Pulse 80  Temp(Src) 98.5 F (36.9 C) (Oral)  Resp 16  Ht 6\' 2"  (1.88 m)  Wt 146 lb 14.4 oz (66.633 kg)  BMI 18.85 kg/m2  SpO2 100%  Intake/Output Summary (Last 24 hours) at 07/10/14 0859 Last data filed at 07/10/14 9604  Gross per 24 hour  Intake      0 ml  Output    150 ml  Net   -150 ml   Intake/Output: I/O last 3 completed shifts: In: 200 [P.O.:200] Out: 400 [Urine:400]  Intake/Output this shift:  Total I/O In: -  Out: 150 [Urine:150] Weight change: -1 lb 14.4 oz (-0.862 kg) Gen:up to chair eating breakfast CVS:RRR no murmur Resp:CTAB Abd:+ BS, soft non tender  VWU:JWJX arm in sling, +bruit and thrill in left upper extremity, swelling decreased, ted hose and abdominal binder in place   Recent Labs Lab 07/06/14 0557 07/06/14 1424 07/06/14 1541 07/07/14 0500 07/08/14 0814 07/09/14 0534 07/10/14 0556  NA 138 139  --  142 142 139 138  K 3.7 3.4*  --  3.7 3.8 4.1 4.4  CL  --  98  --  104 102 102 99  CO2  --  22  --  21 21 19 22   GLUCOSE 134* 185*  --  94 106* 106* 107*  BUN  --  78*  --  76* 84* 89* 96*  CREATININE  --  6.39*  --  6.05* 6.14* 5.72* 5.61*  ALBUMIN  --   --   --   --   --  2.7*  --   CALCIUM  --  8.8  --  8.3* 8.2* 8.4 8.5  PHOS  --   --  5.0*  --   --  5.0*  --    Liver Function Tests:  Recent Labs Lab 07/09/14 0534  ALBUMIN 2.7*   No results found for this basename: LIPASE, AMYLASE,  in the last 168 hours No results found for this basename: AMMONIA,  in the last 168 hours CBC:  Recent Labs Lab 07/06/14 1424 07/07/14 0500 07/08/14 0814 07/09/14 0534  WBC 10.4 5.8 6.4 7.1  HGB 10.3* 8.6* 8.2* 8.5*  HCT 32.1* 26.8* 24.9* 25.7*  MCV 94.4 96.1 93.3 93.8  PLT 270 237 204 205   Cardiac Enzymes:  Recent Labs Lab 07/06/14 1424  TROPONINI <0.30   CBG:  Recent Labs Lab 07/09/14 0752  07/09/14 1219 07/09/14 1710 07/09/14 2329 07/10/14 0740  GLUCAP 110* 154* 155* 110* 103*    Iron Studies: No results found for this basename: IRON, TIBC, TRANSFERRIN, FERRITIN,  in the last 72 hours Studies/Results: No results found. Marland Kitchen aspirin  325 mg Oral QHS  . calcitRIOL  0.25 mcg Oral Q M,W,F  . cycloSPORINE modified  25 mg Oral QHS  . cycloSPORINE modified  50 mg Oral Daily  . docusate sodium  100 mg Oral BID  . feeding supplement (NEPRO CARB STEADY)  237 mL Oral BID BM  . ferrous sulfate  325 mg Oral TID WC  . FLUoxetine  20 mg Oral Q breakfast  . heparin subcutaneous  5,000 Units Subcutaneous 3 times per day  . insulin aspart  0-9 Units Subcutaneous TID WC  . levothyroxine  75 mcg Oral QAC breakfast  . metoprolol tartrate  12.5 mg Oral BID  . mycophenolate  250 mg Oral QHS  . mycophenolate  500 mg Oral Daily  . pantoprazole  40 mg Oral Daily  . pravastatin  10 mg Oral Daily  . saccharomyces boulardii  250 mg Oral QODAY  . senna  2 tablet Oral Daily  . sodium bicarbonate  650 mg Oral TID  . sodium chloride  3 mL Intravenous Q12H  . vitamin B-12  1,000 mcg Oral Q breakfast    BMET    Component Value Date/Time   NA 138 07/10/2014 0556   K 4.4 07/10/2014 0556   CL 99 07/10/2014 0556   CO2 22 07/10/2014 0556   GLUCOSE 107* 07/10/2014 0556   BUN 96* 07/10/2014 0556   CREATININE 5.61* 07/10/2014 0556   CALCIUM 8.5 07/10/2014 0556   GFRNONAA 9* 07/10/2014 0556   GFRAA 10* 07/10/2014 0556   CBC    Component Value Date/Time   WBC 7.1 07/09/2014 0534   RBC 2.74* 07/09/2014 0534   HGB 8.5* 07/09/2014 0534   HCT 25.7* 07/09/2014 0534   PLT 205 07/09/2014 0534   MCV 93.8 07/09/2014 0534   MCH 31.0 07/09/2014 0534   MCHC 33.1 07/09/2014 0534   RDW 14.9 07/09/2014 0534     Assessment/Plan:  1. 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 I/O do not appear to have been documented for last day.  SCr has improved some to 5.6  today. 2. Orthostatic Hypotension-  compression stockings and abdominal binder.  Titration of medication based on standing BP.  Currently on Metoprolol 12.5mg  BID.  May need mestinon as adjunctive in the future but will try to avoid adding another medication.  May be discharged home today. 3. Anemia in CKD 4. Hx of Cardiac Transplant- continue home medications 5. [id number] 12.5mg  BID, will need to titrate based on standing BP as able, allow for higher supine BP (791-505 systolic) 6. Left non displaced humerus fracture: ortho saw inpatient, conservative therapy  Lucious Groves Internal Medicine PGY2  Pager [phone]  I have seen and examined this patient and agree with plan as outlined by Dr. Heber Hudson.  [name] [name] is stable for discharge and will follow up with Dr. Lorrene Reid as previously scheduled as well as Dr. Stanford Breed. Daman Steffenhagen A,MD 07/10/2014 11:07 AM

## 2014-07-10 NOTE — Progress Notes (Signed)
Genella Mech to be D/C'd Home per MD order.  Discussed with the patient and all questions fully answered.    Medication List    STOP taking these medications       furosemide 40 MG tablet  Commonly known as:  LASIX     labetalol 100 MG tablet  Commonly known as:  NORMODYNE      TAKE these medications       acetaminophen 500 MG tablet  Commonly known as:  TYLENOL  Take 1,000 mg by mouth every 6 (six) hours as needed for mild pain.     aspirin 325 MG tablet  Take 325 mg by mouth at bedtime.     calcitRIOL 0.25 MCG capsule  Commonly known as:  ROCALTROL  Take 0.25 mcg by mouth every Monday, Wednesday, and Friday.     cycloSPORINE modified 25 MG capsule  Commonly known as:  NEORAL  Take 25-50 mg by mouth 2 (two) times daily. 50 mg in the morning and 25 mg at night     feeding supplement (NEPRO CARB STEADY) Liqd  Take 237 mLs by mouth 2 (two) times daily between meals.     ferrous sulfate 325 (65 FE) MG tablet  Take 325 mg by mouth 3 (three) times daily with meals.     FLUoxetine 20 MG capsule  Commonly known as:  PROZAC  Take 20 mg by mouth daily with breakfast.     lansoprazole 15 MG capsule  Commonly known as:  PREVACID  Take 30 mg by mouth 2 (two) times daily.     levothyroxine 75 MCG tablet  Commonly known as:  SYNTHROID, LEVOTHROID  Take 75 mcg by mouth daily before breakfast.     metoprolol tartrate 12.5 mg Tabs tablet  Commonly known as:  LOPRESSOR  Take 0.5 tablets (12.5 mg total) by mouth 2 (two) times daily.     mycophenolate 250 MG capsule  Commonly known as:  CELLCEPT  Take 250-500 mg by mouth 2 (two) times daily. Takes 500mg  in the morning and 250mg  in the evening     ondansetron 8 MG disintegrating tablet  Commonly known as:  ZOFRAN ODT  Take 1 tablet (8 mg total) by mouth every 8 (eight) hours as needed for nausea or vomiting.     oxyCODONE 5 MG immediate release tablet  Commonly known as:  ROXICODONE  Take 1 tablet (5 mg total) by mouth  every 6 (six) hours as needed for severe pain.     pravastatin 10 MG tablet  Commonly known as:  PRAVACHOL  Take 10 mg by mouth daily with breakfast.     saccharomyces boulardii 250 MG capsule  Commonly known as:  FLORASTOR  Take 250 mg by mouth every other day.     sodium bicarbonate 650 MG tablet  Take 650 mg by mouth 3 (three) times daily.     vitamin B-12 1000 MCG tablet  Commonly known as:  CYANOCOBALAMIN  Take 1,000 mcg by mouth daily with breakfast.        VVS, Skin clean, dry and intact without evidence of skin break down, no evidence of skin tears noted. IV catheter discontinued intact. Site without signs and symptoms of complications. Dressing and pressure applied. Sling to left arm.   An After Visit Summary was printed and given to the patient. Patient escorted via stretcher, and D/C home via ambulance  Olene Floss 07/10/2014 1:41 PM

## 2014-07-10 NOTE — Progress Notes (Signed)
Physical Therapy Treatment Patient Details Name: [name] [name] MRN: [id number] DOB: Aug 16, 1941 Today's Date: 07/10/2014    History of Present Illness Patient is a 73 y/o male with history of cardiac transplant in 2001, CKD stage V who had left AV fistula placed 8/17. Pt d/ced home, felt dizzy and s/p fall ambulating into home. Did not hit his head or lose consciousness but hurt his left shoulder. Shoulder XRAY-nondisplaced fx proximal humerus. Abdominal binder ordered. PMH positive for HTN, MI, CVA, and DM.    PT Comments    Patient progressing with mobility and tolerated an increase in ambulation distance today. Continues to exhibit balance deficits during gait training requiring close stand by assist for safety.  Education provided on importance of waiting a few minutes after changing in position so body can acclimate to change in BP and reduce symptoms of orthostatic hypotension. Instructed wife on techniques on how to assist patient with bed mobility/transfers. Pt would benefit from acute PT to improve transfers, gait, balance and overall mobility so pt can maximize independence and safely return to PLOF. Pt taking ambulance home as wife does not want to worry about pt negotiating steps.   Follow Up Recommendations  Supervision/Assistance - 24 hour;Home health PT     Equipment Recommendations  None recommended by PT    Recommendations for Other Services       Precautions / Restrictions Precautions Precautions: Fall Precaution Comments: orthostatic hypotension; abdominal binder, ted hose. Monitor BP. Sling LUE secondary to humerus fx. No WB status noted in chart. Required Braces or Orthoses: Sling Restrictions Weight Bearing Restrictions: No    Mobility  Bed Mobility Overal bed mobility: Needs Assistance Bed Mobility: Rolling;Sidelying to Sit;Sit to Supine Rolling: Min assist Sidelying to sit: Min assist   Sit to supine: Modified independent (Device/Increase time)    General bed mobility comments: Removed rails and HOB flat to simulate bed at home. Instructed pt/wife on how to safely perform transfer, requires Min A to roll and to get trunk upright. Sidelying to/from sit x2.  Transfers Overall transfer level: Needs assistance Equipment used: None Transfers: Sit to/from Stand Sit to Stand: Min guard         General transfer comment: Stood from EOBx4. VC for anterior translation. Encouraged pt to wait a few minutes after changing positions to prevent orthostatic symptoms.  Ambulation/Gait Ambulation/Gait assistance: Min assist Ambulation Distance (Feet): 100 Feet Assistive device: None Gait Pattern/deviations: Step-through pattern;Drifts right/left;Wide base of support Gait velocity: Decreased   General Gait Details: Unsteadiness noted during gait with lack of BUE arm swing. Drifting to rt/left noted as pt not able to use LUE for balance as it is immobilized in sling. (+) nausea during ambulation. See vitals in vital section for BP. VC for upright gaze.   Stairs            Wheelchair Mobility    Modified Rankin (Stroke Patients Only)       Balance     Sitting balance-Leahy Scale: Good Sitting balance - Comments: Pt became dizzy sitting EOB initially, however this resolved with increased time. Cues provided to find an object to gaze at to improve sensation of dizziness. Able to use urinal without difficulty sitting EOB.     Standing balance-Leahy Scale: Fair Standing balance comment: Able to perform static standing for short periods with mild sway noted. (+) nausea. Balance deficits noted during ambulation but no overt LOB.  Cognition Arousal/Alertness: Awake/alert Behavior During Therapy: WFL for tasks assessed/performed Overall Cognitive Status: Within Functional Limits for tasks assessed                      Exercises      General Comments General comments (skin integrity, edema, etc.):  Orthostatic vitals taken, please see vitals section. Abdominal binder and ted hose donned for session.      Pertinent Vitals/Pain Pain Assessment: No/denies pain    Home Living                      Prior Function            PT Goals (current goals can now be found in the care plan section) Progress towards PT goals: Progressing toward goals    Frequency  Min 3X/week    PT Plan Current plan remains appropriate    Co-evaluation             End of Session Equipment Utilized During Treatment: Gait belt Activity Tolerance: Patient tolerated treatment well Patient left: in bed;with call bell/phone within reach;with family/visitor present     Time: 1153-1220 PT Time Calculation (min): 27 min  Charges:  $Gait Training: 8-22 mins $Therapeutic Activity: 8-22 mins                    G CodesCandy [name] A 04-Aug-2014, 12:32 PM [name] [name], [place], DPT [phone]

## 2014-07-30 ENCOUNTER — Other Ambulatory Visit (HOSPITAL_COMMUNITY): Payer: Self-pay | Admitting: *Deleted

## 2014-07-31 ENCOUNTER — Encounter (HOSPITAL_COMMUNITY)
Admission: RE | Admit: 2014-07-31 | Discharge: 2014-07-31 | Disposition: A | Discharge status: Home / Self Care | Payer: 59 | Source: Ambulatory Visit | Attending: Nephrology | Admitting: Nephrology

## 2014-07-31 VITALS — BP 154/81 | HR 78 | Temp 98.6°F | Resp 20

## 2014-07-31 DIAGNOSIS — N039 Chronic nephritic syndrome with unspecified morphologic changes: Principal | ICD-10-CM

## 2014-07-31 DIAGNOSIS — D631 Anemia in chronic kidney disease: Secondary | ICD-10-CM | POA: Diagnosis present

## 2014-07-31 DIAGNOSIS — N185 Chronic kidney disease, stage 5: Secondary | ICD-10-CM | POA: Diagnosis not present

## 2014-07-31 DIAGNOSIS — N184 Chronic kidney disease, stage 4 (severe): Secondary | ICD-10-CM

## 2014-07-31 LAB — RENAL FUNCTION PANEL
Albumin: 2.9 g/dL — ABNORMAL LOW (ref 3.5–5.2)
Anion gap: 18 — ABNORMAL HIGH (ref 5–15)
BUN: 63 mg/dL — AB (ref 6–23)
CO2: 21 mEq/L (ref 19–32)
Calcium: 8.3 mg/dL — ABNORMAL LOW (ref 8.4–10.5)
Chloride: 101 mEq/L (ref 96–112)
Creatinine, Ser: 4.14 mg/dL — ABNORMAL HIGH (ref 0.50–1.35)
GFR calc Af Amer: 15 mL/min — ABNORMAL LOW (ref >90)
GFR calc non Af Amer: 13 mL/min — ABNORMAL LOW (ref >90)
GLUCOSE: 116 mg/dL — AB (ref 70–99)
Phosphorus: 4.2 mg/dL (ref 2.3–4.6)
Potassium: 4 mEq/L (ref 3.7–5.3)
Sodium: 140 mEq/L (ref 137–147)

## 2014-07-31 LAB — POCT HEMOGLOBIN-HEMACUE: Hemoglobin: 9.1 g/dL — ABNORMAL LOW (ref 13.0–17.0)

## 2014-07-31 MED ORDER — DARBEPOETIN ALFA-POLYSORBATE 200 MCG/0.4ML IJ SOLN
INTRAMUSCULAR | Status: AC
  Filled 2014-07-31: qty 0.4

## 2014-07-31 MED ORDER — DARBEPOETIN ALFA-POLYSORBATE 200 MCG/0.4ML IJ SOLN
200.0000 ug | INTRAMUSCULAR | Status: DC
  Administered 2014-07-31: 200 ug via SUBCUTANEOUS

## 2014-07-31 NOTE — Discharge Instructions (Signed)
Darbepoetin Alfa injection What is this medicine? DARBEPOETIN ALFA (dar be POE e tin AL fa) helps your body make more red blood cells. It is used to treat anemia caused by chronic kidney failure and chemotherapy. This medicine may be used for other purposes; ask your health care provider or pharmacist if you have questions. COMMON BRAND NAME(S): Aranesp What should I tell my health care provider before I take this medicine? They need to know if you have any of these conditions: -blood clotting disorders or history of blood clots -cancer patient not on chemotherapy -cystic fibrosis -heart disease, such as angina, heart failure, or a history of a heart attack -hemoglobin level of 12 g/dL or greater -high blood pressure -low levels of folate, iron, or vitamin B12 -seizures -an unusual or allergic reaction to darbepoetin, erythropoietin, albumin, hamster proteins, latex, other medicines, foods, dyes, or preservatives -pregnant or trying to get pregnant -breast-feeding How should I use this medicine? This medicine is for injection into a vein or under the skin. It is usually given by a health care professional in a hospital or clinic setting. If you get this medicine at home, you will be taught how to prepare and give this medicine. Do not shake the solution before you withdraw a dose. Use exactly as directed. Take your medicine at regular intervals. Do not take your medicine more often than directed. It is important that you put your used needles and syringes in a special sharps container. Do not put them in a trash can. If you do not have a sharps container, call your pharmacist or healthcare provider to get one. Talk to your pediatrician regarding the use of this medicine in children. While this medicine may be used in children as young as 1 year for selected conditions, precautions do apply. Overdosage: If you think you have taken too much of this medicine contact a poison control center or  emergency room at once. NOTE: This medicine is only for you. Do not share this medicine with others. What if I miss a dose? If you miss a dose, take it as soon as you can. If it is almost time for your next dose, take only that dose. Do not take double or extra doses. What may interact with this medicine? Do not take this medicine with any of the following medications: -epoetin alfa This list may not describe all possible interactions. Give your health care provider a list of all the medicines, herbs, non-prescription drugs, or dietary supplements you use. Also tell them if you smoke, drink alcohol, or use illegal drugs. Some items may interact with your medicine. What should I watch for while using this medicine? Visit your prescriber or health care professional for regular checks on your progress and for the needed blood tests and blood pressure measurements. It is especially important for the doctor to make sure your hemoglobin level is in the desired range, to limit the risk of potential side effects and to give you the best benefit. Keep all appointments for any recommended tests. Check your blood pressure as directed. Ask your doctor what your blood pressure should be and when you should contact him or her. As your body makes more red blood cells, you may need to take iron, folic acid, or vitamin B supplements. Ask your doctor or health care provider which products are right for you. If you have kidney disease continue dietary restrictions, even though this medication can make you feel better. Talk with your doctor or health   care professional about the foods you eat and the vitamins that you take. What side effects may I notice from receiving this medicine? Side effects that you should report to your doctor or health care professional as soon as possible: -allergic reactions like skin rash, itching or hives, swelling of the face, lips, or tongue -breathing problems -changes in vision -chest  pain -confusion, trouble speaking or understanding -feeling faint or lightheaded, falls -high blood pressure -muscle aches or pains -pain, swelling, warmth in the leg -rapid weight gain -severe headaches -sudden numbness or weakness of the face, arm or leg -trouble walking, dizziness, loss of balance or coordination -seizures (convulsions) -swelling of the ankles, feet, hands -unusually weak or tired Side effects that usually do not require medical attention (report to your doctor or health care professional if they continue or are bothersome): -diarrhea -fever, chills (flu-like symptoms) -headaches -nausea, vomiting -redness, stinging, or swelling at site where injected This list may not describe all possible side effects. Call your doctor for medical advice about side effects. You may report side effects to FDA at 1-800-FDA-1088. Where should I keep my medicine? Keep out of the reach of children. Store in a refrigerator between 2 and 8 degrees C (36 and 46 degrees F). Do not freeze. Do not shake. Throw away any unused portion if using a single-dose vial. Throw away any unused medicine after the expiration date. NOTE: This sheet is a summary. It may not cover all possible information. If you have questions about this medicine, talk to your doctor, pharmacist, or health care provider.  2015, Elsevier/Gold Standard. (2008-10-20 10:23:57)  

## 2014-08-03 ENCOUNTER — Ambulatory Visit: Payer: 59 | Attending: Internal Medicine | Admitting: Physical Therapy

## 2014-08-03 ENCOUNTER — Encounter: Payer: Self-pay | Admitting: Vascular Surgery

## 2014-08-03 DIAGNOSIS — IMO0001 Reserved for inherently not codable concepts without codable children: Secondary | ICD-10-CM | POA: Diagnosis not present

## 2014-08-03 DIAGNOSIS — R269 Unspecified abnormalities of gait and mobility: Secondary | ICD-10-CM | POA: Diagnosis not present

## 2014-08-04 ENCOUNTER — Encounter: Payer: Self-pay | Admitting: Vascular Surgery

## 2014-08-04 ENCOUNTER — Ambulatory Visit (INDEPENDENT_AMBULATORY_CARE_PROVIDER_SITE_OTHER): Payer: Self-pay | Admitting: Vascular Surgery

## 2014-08-04 ENCOUNTER — Telehealth: Payer: Self-pay | Admitting: Cardiology

## 2014-08-04 VITALS — BP 127/75 | HR 77 | Resp 16 | Ht 74.0 in | Wt 144.0 lb

## 2014-08-04 DIAGNOSIS — N186 End stage renal disease: Secondary | ICD-10-CM

## 2014-08-04 NOTE — Telephone Encounter (Signed)
Please refill patient's Cyclosporine 25 mg.  Patient has had a heart transplant.

## 2014-08-04 NOTE — Progress Notes (Signed)
Here today for followup of his basilic vein transposition left arm on 07/06/2014. He has had no difficulty with the procedure. He has had multiple falls due to to hypotension and weakness and didn't fracture his left humerus shortly after his transposition surgery. He continues to be in a sling related to this.  On physical exam his incisions are well-healed and his left arm and he has a very nicely developing basilic vein transposition fistula. The vein size is already excellent and he has an excellent thrill through this as well  Impression and plan stable one month status post left basilic vein transposition. Hopefully will be able to use this if needed in another 2 months as he matures. He reports that his renal function has stabilized somewhat and hopefully can postpone that for hemodialysis as long as possible. She will see [place] again on as-needed basis

## 2014-08-04 NOTE — Telephone Encounter (Signed)
Returned call to patient spoke to wife she stated husband needed refill for cyclosporine.Message sent to [facility] for approval.

## 2014-08-04 NOTE — Telephone Encounter (Signed)
Patient followed by Dr Percival Spanish or CHF clinic [facility]

## 2014-08-04 NOTE — Telephone Encounter (Signed)
Will forward for dr hochrein review

## 2014-08-05 NOTE — Telephone Encounter (Signed)
I spoke with Dr. Haroldine Laws and called the patient.  The patient has enough meds for now and Dr. Haroldine Laws will arrange follow up in HF clinic.

## 2014-08-06 ENCOUNTER — Telehealth: Payer: Self-pay | Admitting: Cardiology

## 2014-08-06 MED ORDER — CYCLOSPORINE MODIFIED (NEORAL) 25 MG PO CAPS: 25.0000 mg | ORAL_CAPSULE | Freq: Two times a day (BID) | ORAL | Status: DC

## 2014-08-06 NOTE — Telephone Encounter (Signed)
[name] [name] spoke with Dr. Percival Spanish yesterday who told her Dr. Haroldine Laws would be calling them.  [name] [name] only has one pill left and she thought Dr. Percival Spanish was calling a new Rx in.  Will send in a 30 day supply and she will call to make an appt with Dr. Haroldine Laws for her husband. Refill sent in #90 with no refills.  To get future refills from Dr. Haroldine Laws per Dr. Percival Spanish.

## 2014-08-06 NOTE — Telephone Encounter (Signed)
ok 

## 2014-08-06 NOTE — Telephone Encounter (Signed)
[name] called in stating that MR.Grant is on his last pill of Cyclosporine 25mg  and would like it refilled. She stated that she called the pharmacy about this medication 3 days ago and the pharmacy stated that they had not received a response from the office. Please call   Thanks

## 2014-08-10 ENCOUNTER — Encounter: Payer: 59 | Admitting: Physical Therapy

## 2014-08-12 ENCOUNTER — Encounter: Payer: 59 | Admitting: Physical Therapy

## 2014-08-14 ENCOUNTER — Encounter (HOSPITAL_COMMUNITY)
Admission: RE | Admit: 2014-08-14 | Discharge: 2014-08-14 | Disposition: A | Discharge status: Home / Self Care | Payer: 59 | Source: Ambulatory Visit | Attending: Nephrology | Admitting: Nephrology

## 2014-08-14 VITALS — BP 135/67 | HR 80 | Temp 97.5°F | Resp 20

## 2014-08-14 DIAGNOSIS — D631 Anemia in chronic kidney disease: Secondary | ICD-10-CM | POA: Diagnosis not present

## 2014-08-14 DIAGNOSIS — N184 Chronic kidney disease, stage 4 (severe): Secondary | ICD-10-CM

## 2014-08-14 LAB — IRON AND TIBC
IRON: 69 ug/dL (ref 42–135)
Saturation Ratios: 36 % (ref 20–55)
TIBC: 192 ug/dL — ABNORMAL LOW (ref 215–435)
UIBC: 123 ug/dL — AB (ref 125–400)

## 2014-08-14 LAB — CBC WITH DIFFERENTIAL/PLATELET
BASOS ABS: 0 10*3/uL (ref 0.0–0.1)
Basophils Relative: 1 % (ref 0–1)
Eosinophils Absolute: 0.1 10*3/uL (ref 0.0–0.7)
Eosinophils Relative: 1 % (ref 0–5)
HEMATOCRIT: 27.1 % — AB (ref 39.0–52.0)
Hemoglobin: 8.7 g/dL — ABNORMAL LOW (ref 13.0–17.0)
LYMPHS PCT: 26 % (ref 12–46)
Lymphs Abs: 1.6 10*3/uL (ref 0.7–4.0)
MCH: 29.8 pg (ref 26.0–34.0)
MCHC: 32.1 g/dL (ref 30.0–36.0)
MCV: 92.8 fL (ref 78.0–100.0)
MONO ABS: 0.3 10*3/uL (ref 0.1–1.0)
Monocytes Relative: 5 % (ref 3–12)
NEUTROS ABS: 4.2 10*3/uL (ref 1.7–7.7)
NEUTROS PCT: 67 % (ref 43–77)
Platelets: 283 10*3/uL (ref 150–400)
RBC: 2.92 MIL/uL — ABNORMAL LOW (ref 4.22–5.81)
RDW: 14.4 % (ref 11.5–15.5)
WBC: 6.2 10*3/uL (ref 4.0–10.5)

## 2014-08-14 LAB — URINALYSIS, ROUTINE W REFLEX MICROSCOPIC
Bilirubin Urine: NEGATIVE
Glucose, UA: NEGATIVE mg/dL
Ketones, ur: NEGATIVE mg/dL
NITRITE: NEGATIVE
Protein, ur: >300 mg/dL — AB
SPECIFIC GRAVITY, URINE: 1.017 (ref 1.005–1.030)
UROBILINOGEN UA: 0.2 mg/dL (ref 0.0–1.0)
pH: 5 (ref 5.0–8.0)

## 2014-08-14 LAB — URINE MICROSCOPIC-ADD ON

## 2014-08-14 LAB — FERRITIN: FERRITIN: 736 ng/mL — AB (ref 22–322)

## 2014-08-14 MED ORDER — DARBEPOETIN ALFA-POLYSORBATE 200 MCG/0.4ML IJ SOLN
INTRAMUSCULAR | Status: AC
  Administered 2014-08-14: 200 ug via SUBCUTANEOUS
  Filled 2014-08-14: qty 0.4

## 2014-08-14 MED ORDER — DARBEPOETIN ALFA-POLYSORBATE 200 MCG/0.4ML IJ SOLN
200.0000 ug | INTRAMUSCULAR | Status: DC
  Administered 2014-08-14: 200 ug via SUBCUTANEOUS

## 2014-08-17 ENCOUNTER — Encounter: Payer: 59 | Admitting: Physical Therapy

## 2014-08-18 ENCOUNTER — Encounter: Payer: Self-pay | Admitting: Cardiology

## 2014-08-18 ENCOUNTER — Ambulatory Visit (INDEPENDENT_AMBULATORY_CARE_PROVIDER_SITE_OTHER): Payer: 59 | Admitting: Cardiology

## 2014-08-18 VITALS — BP 136/70 | HR 84 | Ht 74.0 in | Wt 139.3 lb

## 2014-08-18 DIAGNOSIS — N186 End stage renal disease: Secondary | ICD-10-CM

## 2014-08-18 DIAGNOSIS — I951 Orthostatic hypotension: Secondary | ICD-10-CM

## 2014-08-18 MED ORDER — METOPROLOL TARTRATE 25 MG PO TABS: 12.5000 mg | ORAL_TABLET | Freq: Two times a day (BID) | ORAL | Status: DC

## 2014-08-18 MED ORDER — CYCLOSPORINE MODIFIED (NEORAL) 25 MG PO CAPS: 25.0000 mg | ORAL_CAPSULE | Freq: Two times a day (BID) | ORAL | Status: DC

## 2014-08-18 NOTE — Assessment & Plan Note (Signed)
Continue statin. 

## 2014-08-18 NOTE — Assessment & Plan Note (Signed)
Continue present medications. He will followup in the CHF clinic which was already scheduled. He may need followup at [facility] or in [place].

## 2014-08-18 NOTE — Progress Notes (Signed)
HPI: FU history of cardiac transplant, orthostatic hypotension, stage 5 renal insufficiency. Patient is status post cardiac transplant in December of 2001. He has previously been followed at [facility] in [place]. Recently moved to this area to be closer to family. Patient has had significant issues with orthostasis. He has had medication adjustments made. He became severely dizzy in August falling and fracturing his humerus. Lasix was discontinued And labetalol change to metoprolol. Compression hose and abdominal binder recommended. Note echocardiogram August 2015 showed transplanted heart, normal LV function, left ventricular hypertrophy and biatrial enlargement. Since he was last seen he denies dyspnea, chest pain or palpitations. He continues to have orthostatic symptoms. Essentially unchanged compared to previous.   Current Outpatient Prescriptions  Medication Sig Dispense Refill  . acetaminophen (TYLENOL) 500 MG tablet Take 1,000 mg by mouth every 6 (six) hours as needed for mild pain.      Marland Kitchen aspirin 325 MG tablet Take 325 mg by mouth at bedtime.      . calcitRIOL (ROCALTROL) 0.25 MCG capsule Take 0.25 mcg by mouth every Monday, Wednesday, and Friday.      . cycloSPORINE modified (NEORAL) 25 MG capsule Take 1-2 capsules (25-50 mg total) by mouth 2 (two) times daily. 50 mg in the morning and 25 mg at night  90 capsule  0  . diphenoxylate-atropine (LOMOTIL) 2.5-0.025 MG per tablet Take 1 tablet by mouth daily as needed for diarrhea or loose stools.      . ferrous sulfate 325 (65 FE) MG tablet Take 325 mg by mouth 3 (three) times daily with meals.      Marland Kitchen FLUoxetine (PROZAC) 20 MG capsule Take 20 mg by mouth daily with breakfast.      . lansoprazole (PREVACID) 15 MG capsule Take 30 mg by mouth 2 (two) times daily.      Marland Kitchen levothyroxine (SYNTHROID, LEVOTHROID) 75 MCG tablet Take 75 mcg by mouth daily before breakfast.      . magnesium oxide (MAG-OX) 400 MG tablet Take 400 mg by  mouth daily.      . metoprolol tartrate (LOPRESSOR) 25 MG tablet Take 12.5-25 mg by mouth 2 (two) times daily.      . mycophenolate (CELLCEPT) 250 MG capsule Take 250-500 mg by mouth 2 (two) times daily. Takes 500mg  in the morning and 250mg  in the evening      . Nutritional Supplements (FEEDING SUPPLEMENT, NEPRO CARB STEADY,) LIQD Take 237 mLs by mouth 2 (two) times daily between meals.  60 Can  6  . ondansetron (ZOFRAN ODT) 8 MG disintegrating tablet Take 1 tablet (8 mg total) by mouth every 8 (eight) hours as needed for nausea or vomiting.  12 tablet  0  . oxyCODONE (ROXICODONE) 5 MG immediate release tablet Take 1 tablet (5 mg total) by mouth every 6 (six) hours as needed for severe pain.  20 tablet  0  . pravastatin (PRAVACHOL) 10 MG tablet Take 10 mg by mouth daily with breakfast.      . saccharomyces boulardii (FLORASTOR) 250 MG capsule Take 250 mg by mouth every other day.      . sodium bicarbonate 650 MG tablet Take 650 mg by mouth 3 (three) times daily.      . vitamin B-12 (CYANOCOBALAMIN) 1000 MCG tablet Take 1,000 mcg by mouth daily with breakfast.       No current facility-administered medications for this visit.     Past Medical History  Diagnosis Date  . CKD  III-IV     a. since transplant in 2001 with significant worsening since 06/2013.  Marland Kitchen Hypertension     a. Long h/o HTN, came off of all meds following transplant/wt loss-->meds resumed 06/2013, difficult to control since.  Marland Kitchen History of stroke   . Hypercalcemia     a. 06/2013 Hospitalized in [place].  Marland Kitchen Heart transplanted     a. 1995 CABG x 4 in [place], [place];  b. 2001 Developed CHF;  c. 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 10/2000 Cardiac transplant @ [facility], [place];  e. Historically nl Bx and stress testing.  Last stress test ~ 5 yrs ago.  Marland Kitchen Hernia of abdominal wall   . Hyperlipidemia   . Orthostatic hypotension     a. Has tried support hose and compression stockings - prefers not to wear.  . Coronary artery disease   .  Myocardial infarction     PRIOR TO HEART TRANSPLANT  . Shortness of breath     EXERTION  . Orthostatic dizziness   . Stroke 06/2013    NO RESIDUAL - WAS CONFUSED AT TIME - THAT HAS RESOLVED  . Diabetes mellitus without complication     FASTING CBG 90S  . Headache(784.0)   . Cancer     SKIN  . Anemia   . History of blood transfusion     Past Surgical History  Procedure Laterality Date  . Cardiac surgery    . Heart transplant    . Cataract extraction    . Vasectomy    . Skin graft full thickness leg      cat scratch that did not heal  . Coronary artery bypass graft    . Eye surgery Bilateral     CATARACTS  . Bascilic vein transposition Left 07/06/2014    Procedure: Reading;  Surgeon: Rosetta Posner, MD;  Location: [facility];  Service: Vascular;  Laterality: Left;    History   Social History  . Marital Status: Married    Spouse Name: N/A    Number of Children: N/A  . Years of Education: N/A   Occupational History  . Education officer, community    Social History Main Topics  . Smoking status: Never Smoker   . Smokeless tobacco: Never Used  . Alcohol Use: No  . Drug Use: No  . Sexual Activity: Not on file   Other Topics Concern  . Not on file   Social History Narrative   Patient is from [place].  Moved to [place] in late '90's.  Moved to [place] 06/20/2014 to live closer to son.  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  Plans to continue to work now that he is in [place].    ROS: no fevers or chills, productive cough, hemoptysis, dysphasia, odynophagia, melena, hematochezia, dysuria, hematuria, rash, seizure activity, orthopnea, PND, pedal edema, claudication. Remaining systems are negative.  Physical Exam: Well-developed frail in no acute distress.  Skin is warm and dry.  HEENT is normal.  Neck is supple.  Chest is clear to auscultation with normal expansion.  Cardiovascular exam is regular rate and rhythm.  Abdominal exam nontender or  distended. No masses palpated. Extremities show no edema. neuro grossly intact

## 2014-08-18 NOTE — Assessment & Plan Note (Signed)
He continues to have orthostatic symptoms. We will decrease metoprolol 12.5 mg by mouth twice a day. We will allow his blood pressure to run higher in the supine position to prevent orthostatic symptoms. His systolic typically runs 163-846 in the lying position. Continue compression hose and abdominal binder. I stressed the importance of increased by mouth fluid intake and sodium intake. If symptoms persist he may require Mestinon in the future.

## 2014-08-18 NOTE — Patient Instructions (Signed)
Your physician recommends that you schedule a follow-up appointment in: WITH DR BENSIMHON AS SCHEDULED  DECREASE METOPROLOL TO 12.5 MG TWICE DAILY

## 2014-08-18 NOTE — Assessment & Plan Note (Signed)
Followed by nephrology. 

## 2014-08-20 ENCOUNTER — Encounter: Payer: 59 | Admitting: Physical Therapy

## 2014-08-21 ENCOUNTER — Encounter (HOSPITAL_COMMUNITY): Payer: 59

## 2014-08-24 ENCOUNTER — Encounter: Payer: 59 | Admitting: Physical Therapy

## 2014-08-24 ENCOUNTER — Encounter (HOSPITAL_COMMUNITY)
Admission: RE | Admit: 2014-08-24 | Discharge: 2014-08-24 | Disposition: A | Discharge status: Home / Self Care | Payer: 59 | Source: Ambulatory Visit | Attending: Nephrology | Admitting: Nephrology

## 2014-08-24 VITALS — BP 152/76 | HR 80 | Temp 98.3°F | Resp 20

## 2014-08-24 DIAGNOSIS — N184 Chronic kidney disease, stage 4 (severe): Secondary | ICD-10-CM | POA: Insufficient documentation

## 2014-08-24 MED ORDER — DARBEPOETIN ALFA-POLYSORBATE 200 MCG/0.4ML IJ SOLN
INTRAMUSCULAR | Status: AC
  Administered 2014-08-24: 200 ug via SUBCUTANEOUS
  Filled 2014-08-24: qty 0.4

## 2014-08-24 MED ORDER — DARBEPOETIN ALFA-POLYSORBATE 200 MCG/0.4ML IJ SOLN
200.0000 ug | INTRAMUSCULAR | Status: DC
  Administered 2014-08-24: 200 ug via SUBCUTANEOUS

## 2014-08-24 NOTE — Progress Notes (Signed)
Called and spoke with [place] at [facility] and reported hemocue today of 7.8, no complaints of SOB or chest pain, and has not seen any blood when gone to the bathroom.  Pt did report a fall last week, is on aspirin, and notable black eye on the left visible.  I reported all of the above to [place], and no new orders received.

## 2014-08-25 ENCOUNTER — Encounter (HOSPITAL_COMMUNITY): Payer: Self-pay | Admitting: Emergency Medicine

## 2014-08-25 ENCOUNTER — Emergency Department (HOSPITAL_COMMUNITY): Payer: 59

## 2014-08-25 ENCOUNTER — Emergency Department (HOSPITAL_COMMUNITY)
Admission: EM | Admit: 2014-08-25 | Discharge: 2014-08-26 | Disposition: A | Discharge status: Home / Self Care | Payer: 59 | Attending: Emergency Medicine | Admitting: Emergency Medicine

## 2014-08-25 ENCOUNTER — Ambulatory Visit: Payer: 59 | Admitting: Physical Therapy

## 2014-08-25 DIAGNOSIS — Z951 Presence of aortocoronary bypass graft: Secondary | ICD-10-CM | POA: Diagnosis not present

## 2014-08-25 DIAGNOSIS — Z8719 Personal history of other diseases of the digestive system: Secondary | ICD-10-CM | POA: Insufficient documentation

## 2014-08-25 DIAGNOSIS — I2510 Atherosclerotic heart disease of native coronary artery without angina pectoris: Secondary | ICD-10-CM | POA: Diagnosis not present

## 2014-08-25 DIAGNOSIS — I129 Hypertensive chronic kidney disease with stage 1 through stage 4 chronic kidney disease, or unspecified chronic kidney disease: Secondary | ICD-10-CM | POA: Insufficient documentation

## 2014-08-25 DIAGNOSIS — H8112 Benign paroxysmal vertigo, left ear: Secondary | ICD-10-CM

## 2014-08-25 DIAGNOSIS — Z941 Heart transplant status: Secondary | ICD-10-CM | POA: Diagnosis not present

## 2014-08-25 DIAGNOSIS — E876 Hypokalemia: Secondary | ICD-10-CM | POA: Insufficient documentation

## 2014-08-25 DIAGNOSIS — E860 Dehydration: Secondary | ICD-10-CM | POA: Diagnosis not present

## 2014-08-25 DIAGNOSIS — Z7982 Long term (current) use of aspirin: Secondary | ICD-10-CM | POA: Insufficient documentation

## 2014-08-25 DIAGNOSIS — I252 Old myocardial infarction: Secondary | ICD-10-CM | POA: Insufficient documentation

## 2014-08-25 DIAGNOSIS — N184 Chronic kidney disease, stage 4 (severe): Secondary | ICD-10-CM | POA: Insufficient documentation

## 2014-08-25 DIAGNOSIS — E878 Other disorders of electrolyte and fluid balance, not elsewhere classified: Secondary | ICD-10-CM

## 2014-08-25 DIAGNOSIS — Z8673 Personal history of transient ischemic attack (TIA), and cerebral infarction without residual deficits: Secondary | ICD-10-CM | POA: Diagnosis not present

## 2014-08-25 DIAGNOSIS — D649 Anemia, unspecified: Secondary | ICD-10-CM | POA: Diagnosis not present

## 2014-08-25 DIAGNOSIS — Z79899 Other long term (current) drug therapy: Secondary | ICD-10-CM | POA: Diagnosis not present

## 2014-08-25 DIAGNOSIS — Z85828 Personal history of other malignant neoplasm of skin: Secondary | ICD-10-CM | POA: Insufficient documentation

## 2014-08-25 DIAGNOSIS — E119 Type 2 diabetes mellitus without complications: Secondary | ICD-10-CM | POA: Insufficient documentation

## 2014-08-25 DIAGNOSIS — E785 Hyperlipidemia, unspecified: Secondary | ICD-10-CM | POA: Insufficient documentation

## 2014-08-25 DIAGNOSIS — K529 Noninfective gastroenteritis and colitis, unspecified: Secondary | ICD-10-CM

## 2014-08-25 DIAGNOSIS — R197 Diarrhea, unspecified: Secondary | ICD-10-CM | POA: Insufficient documentation

## 2014-08-25 LAB — CBC
HEMATOCRIT: 27.6 % — AB (ref 39.0–52.0)
Hemoglobin: 8.9 g/dL — ABNORMAL LOW (ref 13.0–17.0)
MCH: 30.3 pg (ref 26.0–34.0)
MCHC: 32.2 g/dL (ref 30.0–36.0)
MCV: 93.9 fL (ref 78.0–100.0)
PLATELETS: 318 10*3/uL (ref 150–400)
RBC: 2.94 MIL/uL — ABNORMAL LOW (ref 4.22–5.81)
RDW: 15.7 % — ABNORMAL HIGH (ref 11.5–15.5)
WBC: 3.9 10*3/uL — AB (ref 4.0–10.5)

## 2014-08-25 LAB — COMPREHENSIVE METABOLIC PANEL
ALBUMIN: 3.1 g/dL — AB (ref 3.5–5.2)
ALK PHOS: 105 U/L (ref 39–117)
ALT: 7 U/L (ref 0–53)
AST: 9 U/L (ref 0–37)
Anion gap: 18 — ABNORMAL HIGH (ref 5–15)
BILIRUBIN TOTAL: 0.2 mg/dL — AB (ref 0.3–1.2)
BUN: 46 mg/dL — ABNORMAL HIGH (ref 6–23)
CHLORIDE: 108 meq/L (ref 96–112)
CO2: 13 mEq/L — ABNORMAL LOW (ref 19–32)
Calcium: 7.7 mg/dL — ABNORMAL LOW (ref 8.4–10.5)
Creatinine, Ser: 3.52 mg/dL — ABNORMAL HIGH (ref 0.50–1.35)
GFR calc Af Amer: 18 mL/min — ABNORMAL LOW (ref >90)
GFR calc non Af Amer: 16 mL/min — ABNORMAL LOW (ref >90)
Glucose, Bld: 97 mg/dL (ref 70–99)
POTASSIUM: 2.9 meq/L — AB (ref 3.7–5.3)
SODIUM: 139 meq/L (ref 137–147)
TOTAL PROTEIN: 5.9 g/dL — AB (ref 6.0–8.3)

## 2014-08-25 LAB — POCT HEMOGLOBIN-HEMACUE: Hemoglobin: 7.8 g/dL — ABNORMAL LOW (ref 13.0–17.0)

## 2014-08-25 LAB — PHOSPHORUS: PHOSPHORUS: 3.8 mg/dL (ref 2.3–4.6)

## 2014-08-25 LAB — I-STAT CHEM 8, ED
BUN: 44 mg/dL — ABNORMAL HIGH (ref 6–23)
CHLORIDE: 114 meq/L — AB (ref 96–112)
Calcium, Ion: 1.14 mmol/L (ref 1.13–1.30)
Creatinine, Ser: 3.8 mg/dL — ABNORMAL HIGH (ref 0.50–1.35)
Glucose, Bld: 95 mg/dL (ref 70–99)
HEMATOCRIT: 42 % (ref 39.0–52.0)
HEMOGLOBIN: 14.3 g/dL (ref 13.0–17.0)
POTASSIUM: 2.9 meq/L — AB (ref 3.7–5.3)
SODIUM: 141 meq/L (ref 137–147)
TCO2: 12 mmol/L (ref 0–100)

## 2014-08-25 LAB — APTT: APTT: 40 s — AB (ref 24–37)

## 2014-08-25 LAB — IRON AND TIBC
IRON: 38 ug/dL — AB (ref 42–135)
Saturation Ratios: 27 % (ref 20–55)
TIBC: 143 ug/dL — ABNORMAL LOW (ref 215–435)
UIBC: 105 ug/dL — ABNORMAL LOW (ref 125–400)

## 2014-08-25 LAB — BASIC METABOLIC PANEL
Anion gap: 16 — ABNORMAL HIGH (ref 5–15)
BUN: 47 mg/dL — AB (ref 6–23)
CO2: 12 mEq/L — ABNORMAL LOW (ref 19–32)
Calcium: 7.5 mg/dL — ABNORMAL LOW (ref 8.4–10.5)
Chloride: 112 mEq/L (ref 96–112)
Creatinine, Ser: 3.24 mg/dL — ABNORMAL HIGH (ref 0.50–1.35)
GFR, EST AFRICAN AMERICAN: 20 mL/min — AB (ref >90)
GFR, EST NON AFRICAN AMERICAN: 18 mL/min — AB (ref >90)
Glucose, Bld: 112 mg/dL — ABNORMAL HIGH (ref 70–99)
POTASSIUM: 3.3 meq/L — AB (ref 3.7–5.3)
Sodium: 140 mEq/L (ref 137–147)

## 2014-08-25 LAB — TYPE AND SCREEN
ABO/RH(D): O POS
ANTIBODY SCREEN: NEGATIVE

## 2014-08-25 LAB — I-STAT CG4 LACTIC ACID, ED: Lactic Acid, Venous: 0.97 mmol/L (ref 0.5–2.2)

## 2014-08-25 LAB — ABO/RH: ABO/RH(D): O POS

## 2014-08-25 LAB — PROTIME-INR
INR: 1.32 (ref 0.00–1.49)
Prothrombin Time: 16.4 seconds — ABNORMAL HIGH (ref 11.6–15.2)

## 2014-08-25 LAB — RETICULOCYTES
RBC.: 2.93 MIL/uL — ABNORMAL LOW (ref 4.22–5.81)
RETIC COUNT ABSOLUTE: 38.1 10*3/uL (ref 19.0–186.0)
Retic Ct Pct: 1.3 % (ref 0.4–3.1)

## 2014-08-25 LAB — MAGNESIUM: Magnesium: 1.4 mg/dL — ABNORMAL LOW (ref 1.5–2.5)

## 2014-08-25 LAB — TSH: TSH: 3.83 u[IU]/mL (ref 0.350–4.500)

## 2014-08-25 LAB — FERRITIN: FERRITIN: 612 ng/mL — AB (ref 22–322)

## 2014-08-25 LAB — FOLATE: Folate: 7.5 ng/mL

## 2014-08-25 LAB — VITAMIN B12: Vitamin B-12: 880 pg/mL (ref 211–911)

## 2014-08-25 MED ORDER — MECLIZINE HCL 25 MG PO TABS: 50.0000 mg | ORAL_TABLET | Freq: Once | ORAL | Status: DC

## 2014-08-25 MED ORDER — POTASSIUM CHLORIDE CRYS ER 20 MEQ PO TBCR: 20.0000 meq | EXTENDED_RELEASE_TABLET | Freq: Every day | ORAL | Status: DC

## 2014-08-25 MED ORDER — METOPROLOL TARTRATE 25 MG PO TABS: 12.5000 mg | ORAL_TABLET | Freq: Once | ORAL | Status: DC

## 2014-08-25 MED ORDER — MECLIZINE HCL 50 MG PO TABS: 25.0000 mg | ORAL_TABLET | Freq: Three times a day (TID) | ORAL | Status: DC | PRN

## 2014-08-25 MED ORDER — POTASSIUM CHLORIDE 10 MEQ/100ML IV SOLN
10.0000 meq | INTRAVENOUS | Status: AC
  Administered 2014-08-25 (×3): 10 meq via INTRAVENOUS
  Filled 2014-08-25 (×3): qty 100

## 2014-08-25 MED ORDER — ONDANSETRON HCL 4 MG/2ML IJ SOLN
4.0000 mg | Freq: Once | INTRAMUSCULAR | Status: AC
  Administered 2014-08-25: 4 mg via INTRAVENOUS
  Filled 2014-08-25: qty 2

## 2014-08-25 MED ORDER — MECLIZINE HCL 25 MG PO TABS
25.0000 mg | ORAL_TABLET | Freq: Once | ORAL | Status: AC
  Administered 2014-08-25: 25 mg via ORAL
  Filled 2014-08-25: qty 1

## 2014-08-25 MED ORDER — SODIUM CHLORIDE 0.9 % IV BOLUS (SEPSIS)
1000.0000 mL | Freq: Once | INTRAVENOUS | Status: AC
  Administered 2014-08-25: 1000 mL via INTRAVENOUS

## 2014-08-25 MED ORDER — POTASSIUM CHLORIDE CRYS ER 20 MEQ PO TBCR
40.0000 meq | EXTENDED_RELEASE_TABLET | Freq: Once | ORAL | Status: AC
  Administered 2014-08-25: 40 meq via ORAL
  Filled 2014-08-25: qty 2

## 2014-08-25 MED ORDER — SODIUM BICARBONATE 8.4 % IV SOLN
50.0000 meq | Freq: Once | INTRAVENOUS | Status: AC
  Administered 2014-08-25: 50 meq via INTRAVENOUS
  Filled 2014-08-25: qty 50

## 2014-08-25 NOTE — ED Notes (Signed)
Critical lab value potassium 2.9. MD notified.

## 2014-08-25 NOTE — ED Notes (Signed)
Pt here with wife and son for low hgb of 7.8 from 8.9 within a week. Still has diarrhea but tested negative for c-diff. Has been falling a lot lately, uses a cane and walker, bruising to left eye. Broken left shoulder and left wrist pain. States his eyes can't focus. Pt has bilateral nystagmus. No droop or drift noted.

## 2014-08-25 NOTE — Discharge Instructions (Signed)
Please follow with your primary care doctor in the next 2 days for a check-up. They must obtain records for further management.  ° °Do not hesitate to return to the Emergency Department for any new, worsening or concerning symptoms.  ° ° °Benign Positional Vertigo °Vertigo means you feel like you or your surroundings are moving when they are not. Benign positional vertigo is the most common form of vertigo. Benign means that the cause of your condition is not serious. Benign positional vertigo is more common in older adults. °CAUSES  °Benign positional vertigo is the result of an upset in the labyrinth system. This is an area in the middle ear that helps control your balance. This may be caused by a viral infection, head injury, or repetitive motion. However, often no specific cause is found. °SYMPTOMS  °Symptoms of benign positional vertigo occur when you move your head or eyes in different directions. Some of the symptoms may include: °· Loss of balance and falls. °· Vomiting. °· Blurred vision. °· Dizziness. °· Nausea. °· Involuntary eye movements (nystagmus). °DIAGNOSIS  °Benign positional vertigo is usually diagnosed by physical exam. If the specific cause of your benign positional vertigo is unknown, your caregiver may perform imaging tests, such as magnetic resonance imaging (MRI) or computed tomography (CT). °TREATMENT  °Your caregiver may recommend movements or procedures to correct the benign positional vertigo. Medicines such as meclizine, benzodiazepines, and medicines for nausea may be used to treat your symptoms. In rare cases, if your symptoms are caused by certain conditions that affect the inner ear, you may need surgery. °HOME CARE INSTRUCTIONS  °· Follow your caregiver's instructions. °· Move slowly. Do not make sudden body or head movements. °· Avoid driving. °· Avoid operating heavy machinery. °· Avoid performing any tasks that would be dangerous to you or others during a vertigo  episode. °· Drink enough fluids to keep your urine clear or pale yellow. °SEEK IMMEDIATE MEDICAL CARE IF:  °· You develop problems with walking, weakness, numbness, or using your arms, hands, or legs. °· You have difficulty speaking. °· You develop severe headaches. °· Your nausea or vomiting continues or gets worse. °· You develop visual changes. °· Your family or friends notice any behavioral changes. °· Your condition gets worse. °· You have a fever. °· You develop a stiff neck or sensitivity to light. °MAKE SURE YOU:  °· Understand these instructions. °· Will watch your condition. °· Will get help right away if you are not doing well or get worse. °Document Released: 08/14/2006 Document Revised: 01/29/2012 Document Reviewed: 07/27/2011 °ExitCare® Patient Information ©2015 [employer]. This information is not intended to replace advice given to you by your health care provider. Make sure you discuss any questions you have with your health care provider. ° °

## 2014-08-25 NOTE — ED Provider Notes (Signed)
CSN: [id number]     Arrival date & time 08/25/14  1734 History   First MD Initiated Contact with Patient 08/25/14 1753     Chief Complaint  Patient presents with  . Abnormal Lab  . Dizziness  . Diarrhea    neg for C-diff     (Consider location/radiation/quality/duration/timing/severity/associated sxs/prior Treatment) HPI  [name] [name] is a 73 y.o. male with past medical history significant for heart transplant,  worsening chronic kidney disease, CVA, hyperlipidemia, orthostatic hypotension, presenting for evaluation of hemoglobin which dropped from 7.8 from 8.9 within the last week. Last colonoscopy was in January of 2015 with no abnormality. Patient has chronic diarrhea but tested negative for C. difficile 4 days ago. Stool is dark however he has been taking iron pills. As per wife the stool changed to a yellowish color approximately 3 days ago. Patient denies any chest pain, shortness of breath, applications, he has orthostatic hypotension which is unchanged. Patient states he has a change in his vision with vertigo which is worse when looking to his left. States that he has had this multiple times in the past but it has lasted all day today which is longer than normal. As per his wife he has been taking physical therapy for canalith reproducing. He denies abdominal pain, nausea vomiting, change in urination, rash, headache, fever or chills. As per wife he's been taking EPO shots for chronic anemia. Patient states that he fell several weeks ago when going from sitting to standing he became dizzy. He has a  hairline fracture of the left humerus.  Past Medical History  Diagnosis Date  . CKD III-IV     a. since transplant in 2001 with significant worsening since 06/2013.  Marland Kitchen Hypertension     a. Long h/o HTN, came off of all meds following transplant/wt loss-->meds resumed 06/2013, difficult to control since.  Marland Kitchen History of stroke   . Hypercalcemia     a. 06/2013 Hospitalized in [place].  Marland Kitchen Heart  transplanted     a. 1995 CABG x 4 in [place], [place];  b. 2001 Developed CHF;  c. 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 10/2000 Cardiac transplant @ [facility], [place];  e. Historically nl Bx and stress testing.  Last stress test ~ 5 yrs ago.  Marland Kitchen Hernia of abdominal wall   . Hyperlipidemia   . Orthostatic hypotension     a. Has tried support hose and compression stockings - prefers not to wear.  . Coronary artery disease   . Myocardial infarction     PRIOR TO HEART TRANSPLANT  . Shortness of breath     EXERTION  . Orthostatic dizziness   . Stroke 06/2013    NO RESIDUAL - WAS CONFUSED AT TIME - THAT HAS RESOLVED  . Diabetes mellitus without complication     FASTING CBG 90S  . Headache(784.0)   . Cancer     SKIN  . Anemia   . History of blood transfusion    Past Surgical History  Procedure Laterality Date  . Cardiac surgery    . Heart transplant    . Cataract extraction    . Vasectomy    . Skin graft full thickness leg      cat scratch that did not heal  . Coronary artery bypass graft    . Eye surgery Bilateral     CATARACTS  . Bascilic vein transposition Left 07/06/2014    Procedure: Moncure;  Surgeon: Rosetta Posner, MD;  Location: [facility]  OR;  Service: Vascular;  Laterality: Left;   Family History  Problem Relation Age of Onset  . Heart attack Father     died @ 34  . Heart attack Mother    History  Substance Use Topics  . Smoking status: Never Smoker   . Smokeless tobacco: Never Used  . Alcohol Use: No    Review of Systems  10 systems reviewed and found to be negative, except as noted in the HPI.   Allergies  Norvasc  Home Medications   Prior to Admission medications   Medication Sig Start Date End Date Taking? Authorizing Provider  acetaminophen (TYLENOL) 500 MG tablet Take 1,000 mg by mouth every 6 (six) hours as needed for mild pain.   Yes Historical Provider, MD  aspirin 325 MG tablet Take 325 mg by mouth at bedtime.   Yes Historical Provider,  MD  calcitRIOL (ROCALTROL) 0.25 MCG capsule Take 0.25 mcg by mouth every Monday, Wednesday, and Friday.   Yes Historical Provider, MD  cycloSPORINE modified (NEORAL) 25 MG capsule Take 25-50 mg by mouth 2 (two) times daily. 50 mg in the morning and 25 mg at night 08/18/14  Yes Lelon Perla, MD  diphenoxylate-atropine (LOMOTIL) 2.5-0.025 MG per tablet Take 1 tablet by mouth daily as needed for diarrhea or loose stools.   Yes Historical Provider, MD  ferrous sulfate 325 (65 FE) MG tablet Take 325 mg by mouth 3 (three) times daily with meals.   Yes Historical Provider, MD  FLUoxetine (PROZAC) 20 MG capsule Take 20 mg by mouth daily with breakfast.   Yes Historical Provider, MD  lansoprazole (PREVACID) 15 MG capsule Take 15 mg by mouth 2 (two) times daily.    Yes Historical Provider, MD  levothyroxine (SYNTHROID, LEVOTHROID) 75 MCG tablet Take 75 mcg by mouth daily before breakfast.   Yes Historical Provider, MD  magnesium oxide (MAG-OX) 400 MG tablet Take 400 mg by mouth daily.   Yes Historical Provider, MD  metoprolol tartrate (LOPRESSOR) 25 MG tablet Take 0.5 tablets (12.5 mg total) by mouth 2 (two) times daily. 08/18/14  Yes Lelon Perla, MD  mycophenolate (CELLCEPT) 250 MG capsule Take 250 mg by mouth 2 (two) times daily. Take 250mg   in the morning and 250mg  in the evening   Yes Historical Provider, MD  ondansetron (ZOFRAN ODT) 8 MG disintegrating tablet Take 1 tablet (8 mg total) by mouth every 8 (eight) hours as needed for nausea or vomiting. 07/06/14  Yes Hoy Morn, MD  oxyCODONE (ROXICODONE) 5 MG immediate release tablet Take 1 tablet (5 mg total) by mouth every 6 (six) hours as needed for severe pain. 07/06/14  Yes Alvia Grove, PA-C  pravastatin (PRAVACHOL) 10 MG tablet Take 10 mg by mouth daily with breakfast.   Yes Historical Provider, MD  saccharomyces boulardii (FLORASTOR) 250 MG capsule Take 250 mg by mouth every other day.   Yes Historical Provider, MD  sodium bicarbonate 650  MG tablet Take 650 mg by mouth 4 (four) times daily.    Yes Historical Provider, MD  vitamin B-12 (CYANOCOBALAMIN) 1000 MCG tablet Take 1,000 mcg by mouth daily with breakfast.   Yes Historical Provider, MD  meclizine (ANTIVERT) 50 MG tablet Take 0.5 tablets (25 mg total) by mouth 3 (three) times daily as needed. 08/25/14   Marlene Beidler, PA-C  potassium chloride SA (K-DUR,KLOR-CON) 20 MEQ tablet Take 1 tablet (20 mEq total) by mouth daily. 08/25/14   Aldean Pipe, PA-C   BP 192/81  Pulse 77  Temp(Src) 98 F (36.7 C) (Oral)  Resp 17  SpO2 100% Physical Exam  Nursing note and vitals reviewed. Constitutional: He is oriented to person, place, and time. No distress.  HENT:  Head: Normocephalic.  Mouth/Throat: Oropharynx is clear and moist.  Right periOrbital ecchymoses  Eyes: Conjunctivae and EOM are normal.  Bilateral pupil irregularity, equal bilaterally, normal size and reactive.   Neck: Normal range of motion. Neck supple. No thyromegaly present.  No midline C-spine  tenderness to palpation or step-offs appreciated. Patient has full range of motion without pain.   Cardiovascular: Normal rate, regular rhythm and intact distal pulses.   Left a.c. fistula with good thrill  Pulmonary/Chest: Effort normal and breath sounds normal. No stridor. No respiratory distress. He has no wheezes. He has no rales. He exhibits no tenderness.  Abdominal: Soft. Bowel sounds are normal. He exhibits no distension and no mass. There is no tenderness. There is no rebound and no guarding.  Musculoskeletal: Normal range of motion. He exhibits no edema and no tenderness.  Neurological: He is alert and oriented to person, place, and time.  Dix-Hallpike shows horizontal nystagmus with left pointing beats.   II-Visual fields grossly intact. III/IV/VI-Extraocular movements intact.  Pupils reactive bilaterally. V/VII-Smile symmetric, equal eyebrow raise,  facial sensation intact VIII- Hearing grossly  intact IX/X-Normal gag XI-bilateral shoulder shrug XII-midline tongue extension Motor: 5/5 bilaterally with normal tone and bulk Cerebellar: Normal finger-to-nose  and normal heel-to-shin test.   Romberg negative    Psychiatric: He has a normal mood and affect.    ED Course  Procedures (including critical care time) Labs Review Labs Reviewed  CBC - Abnormal; Notable for the following:    WBC 3.9 (*)    RBC 2.94 (*)    Hemoglobin 8.9 (*)    HCT 27.6 (*)    RDW 15.7 (*)    All other components within normal limits  COMPREHENSIVE METABOLIC PANEL - Abnormal; Notable for the following:    Potassium 2.9 (*)    CO2 13 (*)    BUN 46 (*)    Creatinine, Ser 3.52 (*)    Calcium 7.7 (*)    Total Protein 5.9 (*)    Albumin 3.1 (*)    Total Bilirubin 0.2 (*)    GFR calc non Af Amer 16 (*)    GFR calc Af Amer 18 (*)    Anion gap 18 (*)    All other components within normal limits  PROTIME-INR - Abnormal; Notable for the following:    Prothrombin Time 16.4 (*)    All other components within normal limits  APTT - Abnormal; Notable for the following:    aPTT 40 (*)    All other components within normal limits  MAGNESIUM - Abnormal; Notable for the following:    Magnesium 1.4 (*)    All other components within normal limits  IRON AND TIBC - Abnormal; Notable for the following:    Iron 38 (*)    TIBC 143 (*)    UIBC 105 (*)    All other components within normal limits  FERRITIN - Abnormal; Notable for the following:    Ferritin 612 (*)    All other components within normal limits  RETICULOCYTES - Abnormal; Notable for the following:    RBC. 2.93 (*)    All other components within normal limits  BASIC METABOLIC PANEL - Abnormal; Notable for the following:    Potassium 3.3 (*)    CO2 12 (*)  Glucose, Bld 112 (*)    BUN 47 (*)    Creatinine, Ser 3.24 (*)    Calcium 7.5 (*)    GFR calc non Af Amer 18 (*)    GFR calc Af Amer 20 (*)    Anion gap 16 (*)    All other  components within normal limits  I-STAT CHEM 8, ED - Abnormal; Notable for the following:    Potassium 2.9 (*)    Chloride 114 (*)    BUN 44 (*)    Creatinine, Ser 3.80 (*)    All other components within normal limits  PHOSPHORUS  TSH  VITAMIN B12  FOLATE  GI PATHOGEN PANEL BY PCR, STOOL  POC OCCULT BLOOD, ED  I-STAT CG4 LACTIC ACID, ED  TYPE AND SCREEN  ABO/RH    Imaging Review Ct Head Wo Contrast  08/25/2014   CLINICAL DATA:  73 year old male patient with recent history of multiple falls. Bruising over the left eye. Broken left shoulder and left wrist pain. Difficulty focusing his vision. No loss of consciousness.  EXAM: CT HEAD WITHOUT CONTRAST  TECHNIQUE: Contiguous axial images were obtained from the base of the skull through the vertex without intravenous contrast.  COMPARISON:  No priors.  FINDINGS: Physiologic calcifications in the basal ganglia bilaterally. Moderate cerebral and cerebellar atrophy. Patchy and confluent areas of decreased attenuation are noted throughout the deep and periventricular white matter of the cerebral hemispheres bilaterally, compatible with chronic microvascular ischemic disease. Well-defined foci of low attenuation in the right basal ganglia and right cerebellar hemisphere, compatible with old lacunar infarctions. No acute intracranial abnormalities. Specifically, no evidence of acute intracranial hemorrhage, no definite findings of acute/subacute cerebral ischemia, no mass, mass effect, hydrocephalus or abnormal intra or extra-axial fluid collections. Visualized paranasal sinuses and mastoids are well pneumatized. No acute displaced skull fractures are identified.  IMPRESSION: 1. No acute intracranial abnormalities. 2. Moderate cerebral and cerebellar atrophy with chronic microvascular ischemic changes in the cerebral white matter, as above. There are also old lacunar infarcts in the right basal ganglia and right cerebellar hemisphere.   Electronically Signed    By: Vinnie Langton M.D.   On: 08/25/2014 20:00     EKG Interpretation   Date/Time:  Tuesday August 25 2014 19:11:15 EDT Ventricular Rate:  74 PR Interval:  127 QRS Duration: 128 QT Interval:  432 QTC Calculation: 479 R Axis:   87 Text Interpretation:  Sinus rhythm Right bundle branch block No  significant change since last tracing Confirmed by YAO  MD, DAVID ([id number])  on 08/25/2014 11:48:31 PM      MDM   Final diagnoses:  Chronic anemia  BPPV (benign paroxysmal positional vertigo), left  Hypokalemia  Low bicarbonate level  Chronic diarrhea  Dehydration    Filed Vitals:   08/25/14 2200 08/25/14 2300 08/25/14 2330 08/25/14 2345  BP: 195/72 193/80 192/81   Pulse: 82 86 77   Temp:    98 F (36.7 C)  TempSrc:    Oral  Resp: 15 17 17    SpO2: 100% 100% 100%     Medications  metoprolol tartrate (LOPRESSOR) tablet 12.5 mg (12.5 mg Oral Not Given 08/25/14 2223)  ondansetron (ZOFRAN) injection 4 mg (4 mg Intravenous Given 08/25/14 2013)  potassium chloride 10 mEq in 100 mL IVPB (0 mEq Intravenous Stopped 08/25/14 2331)  potassium chloride SA (K-DUR,KLOR-CON) CR tablet 40 mEq (40 mEq Oral Given 08/25/14 2012)  sodium chloride 0.9 % bolus 1,000 mL (0 mLs Intravenous Stopped 08/25/14 2116)  meclizine (ANTIVERT) tablet 25 mg (25 mg Oral Given 08/25/14 2301)  sodium bicarbonate injection 50 mEq (50 mEq Intravenous Given 08/25/14 2332)    [name] [name] is a 73 y.o. male presenting with anemia and vertigo. Recheck of patient's H&H shows a hemoglobin of 8.9 over 27.6. Patient is asymptomatic. No indication for emergent transfusion at this time. Patient's creatinine is at his baseline. Patient's neuro exam is nonfocal, it is most consistent with a benign peripheral paroxysmal vertigo. Patient has been given information on the Apley maneuver and will be referred to ENT. Patient's potassium is found to be low at 2.9. There are no EKG changes. It is likely secondary to his chronic  diarrhea. Patient will be supplemented with 40 mEq by mouth and 10 mEq IV and runs of 3. Recheck shows potassium to be 3.3 after 2 runs IV and by mouth supplementation.  Patient's bicarbonate is low at 12 however he has not had 3 out of the 4 bicarbonate supplements that he was supposed to have today. I think this is also compounded by the diarrhea. Patient has an anion gap of 18 on arrival which is improved to 16 at discharge. Patient will be given an amp of bicarbonate via IV and advised to restart his medications at home.Discussed case with attending MD who agrees with plan and stability to d/c to home.   This is a shared visit with the attending physician who personally evaluated the patient and agrees with the care plan.   Evaluation does not show pathology that would require ongoing emergent intervention or inpatient treatment. Pt is hemodynamically stable and mentating appropriately. Discussed findings and plan with patient/guardian, who agrees with care plan. All questions answered. Return precautions discussed and outpatient follow up given.   New Prescriptions   MECLIZINE (ANTIVERT) 50 MG TABLET    Take 0.5 tablets (25 mg total) by mouth 3 (three) times daily as needed.   POTASSIUM CHLORIDE SA (K-DUR,KLOR-CON) 20 MEQ TABLET    Take 1 tablet (20 mEq total) by mouth daily.         Monico Blitz, PA-C 08/25/14 [place], PA-C 08/26/14 0003

## 2014-08-26 LAB — POC OCCULT BLOOD, ED: Fecal Occult Bld: NEGATIVE

## 2014-08-27 ENCOUNTER — Encounter: Payer: 59 | Admitting: Physical Therapy

## 2014-08-27 NOTE — ED Provider Notes (Signed)
Medical screening examination/treatment/procedure(s) were conducted as a shared visit with non-physician practitioner(s) and myself.  I personally evaluated the patient during the encounter.   EKG Interpretation   Date/Time:  Tuesday August 25 2014 19:11:15 EDT Ventricular Rate:  74 PR Interval:  127 QRS Duration: 128 QT Interval:  432 QTC Calculation: 479 R Axis:   87 Text Interpretation:  Sinus rhythm Right bundle branch block No  significant change since last tracing Confirmed by Emonnie Cannady  MD, Rosalie Gelpi ([id number])  on 08/25/2014 11:48:31 PM      [name] [name] is a 73 y.o. male hx of heart transplant, CKD here with anemia. Has chronic diarrhea and was neg for C diff 4 days ago. Had labs done yesterday that showed Hg dropped to 7.8 from 8.9 at baseline. He is on iron so stool is always dark. Denies passing out. However, patient has hx of vertigo and has been feeling dizzy when he stands up. Last fall for several weeks ago. Denies chest pain or shortness of breath. On exam, chronically ill. Minimal L nystagmus. Nl finger to nose. No pronator drift. CN 2-12 intact. Abdomen soft, nontender. Hg at baseline. However, bicarb 12 with K 2.9. Given Kdur and KCL 10 meq x 3. Also given IVF. Patient is on Na bicarb at home and didn't take 3 doses today. Given 1 amp bicarb as well in the ED. I think most of his symptoms are from chronic diarrhea. Will give Kdur 20 meq daily and I told him to take his bicarb at home. Recommend repeat BMP in a week.    Wandra Arthurs, MD 08/27/14 1800

## 2014-08-28 ENCOUNTER — Encounter (HOSPITAL_COMMUNITY): Payer: 59

## 2014-08-31 ENCOUNTER — Encounter (HOSPITAL_COMMUNITY)
Admission: RE | Admit: 2014-08-31 | Discharge: 2014-08-31 | Disposition: A | Discharge status: Home / Self Care | Payer: 59 | Source: Ambulatory Visit | Attending: Nephrology | Admitting: Nephrology

## 2014-08-31 ENCOUNTER — Encounter: Payer: 59 | Admitting: Physical Therapy

## 2014-08-31 VITALS — BP 130/75 | HR 80 | Temp 98.0°F

## 2014-08-31 DIAGNOSIS — N184 Chronic kidney disease, stage 4 (severe): Secondary | ICD-10-CM | POA: Diagnosis not present

## 2014-08-31 LAB — POCT HEMOGLOBIN-HEMACUE: HEMOGLOBIN: 8.4 g/dL — AB (ref 13.0–17.0)

## 2014-08-31 LAB — URINALYSIS W MICROSCOPIC (NOT AT ARMC)
Bilirubin Urine: NEGATIVE
Glucose, UA: NEGATIVE mg/dL
Ketones, ur: NEGATIVE mg/dL
LEUKOCYTES UA: NEGATIVE
NITRITE: NEGATIVE
Specific Gravity, Urine: 1.014 (ref 1.005–1.030)
UROBILINOGEN UA: 0.2 mg/dL (ref 0.0–1.0)
pH: 5 (ref 5.0–8.0)

## 2014-08-31 MED ORDER — DARBEPOETIN ALFA-POLYSORBATE 200 MCG/0.4ML IJ SOLN
200.0000 ug | INTRAMUSCULAR | Status: DC
  Administered 2014-08-31: 200 ug via SUBCUTANEOUS

## 2014-08-31 MED ORDER — DARBEPOETIN ALFA-POLYSORBATE 200 MCG/0.4ML IJ SOLN
INTRAMUSCULAR | Status: AC
  Filled 2014-08-31: qty 0.4

## 2014-09-01 ENCOUNTER — Ambulatory Visit: Payer: 59 | Attending: Orthopaedic Surgery | Admitting: Physical Therapy

## 2014-09-01 DIAGNOSIS — R269 Unspecified abnormalities of gait and mobility: Secondary | ICD-10-CM | POA: Insufficient documentation

## 2014-09-01 DIAGNOSIS — I951 Orthostatic hypotension: Secondary | ICD-10-CM | POA: Insufficient documentation

## 2014-09-01 LAB — URINE CULTURE
Colony Count: NO GROWTH
Culture: NO GROWTH

## 2014-09-02 ENCOUNTER — Encounter: Payer: 59 | Admitting: Physical Therapy

## 2014-09-03 ENCOUNTER — Ambulatory Visit: Payer: 59 | Admitting: Physical Therapy

## 2014-09-03 DIAGNOSIS — I951 Orthostatic hypotension: Secondary | ICD-10-CM | POA: Diagnosis not present

## 2014-09-07 ENCOUNTER — Encounter (HOSPITAL_COMMUNITY)
Admission: RE | Admit: 2014-09-07 | Discharge: 2014-09-07 | Disposition: A | Discharge status: Home / Self Care | Payer: 59 | Source: Ambulatory Visit | Attending: Nephrology | Admitting: Nephrology

## 2014-09-07 ENCOUNTER — Encounter (HOSPITAL_COMMUNITY): Payer: 59

## 2014-09-07 VITALS — BP 142/83 | HR 86 | Temp 97.6°F | Resp 18

## 2014-09-07 DIAGNOSIS — N184 Chronic kidney disease, stage 4 (severe): Secondary | ICD-10-CM

## 2014-09-07 LAB — RENAL FUNCTION PANEL
ALBUMIN: 2.7 g/dL — AB (ref 3.5–5.2)
Anion gap: 13 (ref 5–15)
BUN: 40 mg/dL — ABNORMAL HIGH (ref 6–23)
CALCIUM: 8.1 mg/dL — AB (ref 8.4–10.5)
CO2: 20 mEq/L (ref 19–32)
Chloride: 105 mEq/L (ref 96–112)
Creatinine, Ser: 3.13 mg/dL — ABNORMAL HIGH (ref 0.50–1.35)
GFR, EST AFRICAN AMERICAN: 21 mL/min — AB (ref >90)
GFR, EST NON AFRICAN AMERICAN: 18 mL/min — AB (ref >90)
Glucose, Bld: 102 mg/dL — ABNORMAL HIGH (ref 70–99)
PHOSPHORUS: 4.1 mg/dL (ref 2.3–4.6)
Potassium: 4.7 mEq/L (ref 3.7–5.3)
SODIUM: 138 meq/L (ref 137–147)

## 2014-09-07 LAB — CYCLOSPORINE LEVEL

## 2014-09-07 LAB — POCT HEMOGLOBIN-HEMACUE: Hemoglobin: 8.8 g/dL — ABNORMAL LOW (ref 13.0–17.0)

## 2014-09-07 MED ORDER — DARBEPOETIN ALFA-POLYSORBATE 200 MCG/0.4ML IJ SOLN
INTRAMUSCULAR | Status: AC
  Filled 2014-09-07: qty 0.4

## 2014-09-07 MED ORDER — DARBEPOETIN ALFA-POLYSORBATE 200 MCG/0.4ML IJ SOLN
200.0000 ug | INTRAMUSCULAR | Status: DC
  Administered 2014-09-07: 200 ug via SUBCUTANEOUS

## 2014-09-08 ENCOUNTER — Ambulatory Visit: Payer: 59 | Admitting: Physical Therapy

## 2014-09-08 LAB — CYCLOSPORINE: Cyclosporine, LabCorp: 129 ng/mL (ref 100–400)

## 2014-09-10 ENCOUNTER — Ambulatory Visit: Payer: 59 | Admitting: Physical Therapy

## 2014-09-10 DIAGNOSIS — I951 Orthostatic hypotension: Secondary | ICD-10-CM | POA: Diagnosis not present

## 2014-09-21 ENCOUNTER — Encounter (HOSPITAL_COMMUNITY)
Admission: RE | Admit: 2014-09-21 | Discharge: 2014-09-21 | Disposition: A | Discharge status: Home / Self Care | Payer: 59 | Source: Ambulatory Visit | Attending: Nephrology | Admitting: Nephrology

## 2014-09-21 DIAGNOSIS — N184 Chronic kidney disease, stage 4 (severe): Secondary | ICD-10-CM | POA: Diagnosis not present

## 2014-09-21 LAB — POCT HEMOGLOBIN-HEMACUE: Hemoglobin: 11.7 g/dL — ABNORMAL LOW (ref 13.0–17.0)

## 2014-09-21 MED ORDER — DARBEPOETIN ALFA 200 MCG/0.4ML IJ SOSY
200.0000 ug | PREFILLED_SYRINGE | INTRAMUSCULAR | Status: DC
  Administered 2014-09-21: 200 ug via SUBCUTANEOUS

## 2014-09-21 MED ORDER — DARBEPOETIN ALFA 200 MCG/0.4ML IJ SOSY
PREFILLED_SYRINGE | INTRAMUSCULAR | Status: AC
  Administered 2014-09-21: 200 ug via SUBCUTANEOUS
  Filled 2014-09-21: qty 0.4

## 2014-09-22 ENCOUNTER — Encounter (HOSPITAL_COMMUNITY): Payer: Self-pay

## 2014-09-22 ENCOUNTER — Ambulatory Visit: Payer: 59 | Attending: Orthopaedic Surgery | Admitting: Physical Therapy

## 2014-09-22 ENCOUNTER — Ambulatory Visit (HOSPITAL_COMMUNITY)
Admission: RE | Admit: 2014-09-22 | Discharge: 2014-09-22 | Disposition: A | Discharge status: Home / Self Care | Payer: 59 | Source: Ambulatory Visit | Attending: Internal Medicine | Admitting: Internal Medicine

## 2014-09-22 VITALS — BP 171/80 | HR 71 | Resp 18 | Wt 162.0 lb

## 2014-09-22 DIAGNOSIS — E119 Type 2 diabetes mellitus without complications: Secondary | ICD-10-CM | POA: Diagnosis not present

## 2014-09-22 DIAGNOSIS — I951 Orthostatic hypotension: Secondary | ICD-10-CM | POA: Insufficient documentation

## 2014-09-22 DIAGNOSIS — I5022 Chronic systolic (congestive) heart failure: Secondary | ICD-10-CM | POA: Diagnosis not present

## 2014-09-22 DIAGNOSIS — R269 Unspecified abnormalities of gait and mobility: Secondary | ICD-10-CM | POA: Insufficient documentation

## 2014-09-22 DIAGNOSIS — I10 Essential (primary) hypertension: Secondary | ICD-10-CM | POA: Diagnosis not present

## 2014-09-22 DIAGNOSIS — N185 Chronic kidney disease, stage 5: Secondary | ICD-10-CM | POA: Insufficient documentation

## 2014-09-22 DIAGNOSIS — Z941 Heart transplant status: Secondary | ICD-10-CM

## 2014-09-22 DIAGNOSIS — N184 Chronic kidney disease, stage 4 (severe): Secondary | ICD-10-CM

## 2014-09-22 NOTE — Progress Notes (Signed)
Patient c/o recent Hx of orthostatic vital signs per PT and other Dr. Thomasene Lot.  Sitting: 171/80 Standing 152/84

## 2014-09-22 NOTE — Patient Instructions (Signed)
Your physician recommends that you schedule a follow-up appointment in: 4 months  

## 2014-09-22 NOTE — Progress Notes (Signed)
Patient ID: [name] [name], male   DOB: [birth year], 73 y.o.   MRN: [id number]      HPI:  Mr. 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 He has previously been followed at [facility] in [facility] but recently moved to [place] to be near family. He plans on going back to [facility] 1-2x/year for f/u. They have been following his immunosuppressive levels.   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 He has been followed by Dr. Stanford Breed. He is referred to the [facility] for [place] to assume his care.  Echocardiogram August 2015 showed transplanted heart, normal LV function, left ventricular hypertrophy and biatrial enlargement.   He has really not had any problems from a cardiac perspective since OHTx but has struggled greatly with orthostasis for years and now getting worse.  He has had several falls and in August he fell and fractured his shoulder. Lasix was discontinued and labetalol change to metoprolol. Compression hose and abdominal binder recommended. Dr. Stanford Breed decreased his Toprol in September. Management of his orthostasis has been complicated by severe resting HTN. Today he saw Dr. Lorrene Reid and SBP was 200. Toprol increased back up to 25 bid. Had recent fall and has ecchymosis under his L eye. Denies orthopnea, PND, or edema.  Says resting SBP ~180 but can fall as much as 60 points. Still unsteady on his feet and going to balance training and PT. Has not taken lasix in a long time. Weight stable.    Current Outpatient Prescriptions  Medication Sig Dispense Refill  . acetaminophen (TYLENOL) 500 MG tablet Take 1,000 mg by mouth every 6 (six) hours as needed for mild pain.    Marland Kitchen aspirin 325 MG tablet Take 325 mg by mouth at bedtime.    . calcitRIOL (ROCALTROL) 0.25 MCG capsule Take 0.25 mcg by mouth every Monday, Wednesday, and Friday.    . cycloSPORINE modified (NEORAL) 25 MG  capsule Take 50 mg by mouth 2 (two) times daily. 50 mg in the morning and 25 mg at night    . diphenoxylate-atropine (LOMOTIL) 2.5-0.025 MG per tablet Take 1 tablet by mouth daily as needed for diarrhea or loose stools.    . ferrous sulfate 325 (65 FE) MG tablet Take 325 mg by mouth 3 (three) times daily with meals.    Marland Kitchen FLUoxetine (PROZAC) 20 MG capsule Take 20 mg by mouth daily with breakfast.    . lansoprazole (PREVACID) 15 MG capsule Take 15 mg by mouth 2 (two) times daily.     Marland Kitchen levothyroxine (SYNTHROID, LEVOTHROID) 75 MCG tablet Take 75 mcg by mouth daily before breakfast.    . magnesium oxide (MAG-OX) 400 MG tablet Take 400 mg by mouth daily.    . meclizine (ANTIVERT) 50 MG tablet Take 0.5 tablets (25 mg total) by mouth 3 (three) times daily as needed. 15 tablet 0  . metoprolol tartrate (LOPRESSOR) 25 MG tablet Take 0.5 tablets (12.5 mg total) by mouth 2 (two) times daily. (Patient taking differently: Take 25 mg by mouth 2 (two) times daily. ) 90 tablet 3  . mycophenolate (CELLCEPT) 250 MG capsule Take 250 mg by mouth 2 (two) times daily. Take 250mg   in the morning and 250mg  in the evening    . ondansetron (ZOFRAN ODT) 8 MG disintegrating tablet Take 1 tablet (8 mg total) by mouth every 8 (eight) hours as needed for  nausea or vomiting. 12 tablet 0  . oxyCODONE (ROXICODONE) 5 MG immediate release tablet Take 1 tablet (5 mg total) by mouth every 6 (six) hours as needed for severe pain. 20 tablet 0  . pravastatin (PRAVACHOL) 10 MG tablet Take 10 mg by mouth daily with breakfast.    . saccharomyces boulardii (FLORASTOR) 250 MG capsule Take 250 mg by mouth daily.     . sodium bicarbonate 650 MG tablet Take 650 mg by mouth 4 (four) times daily.     . vitamin B-12 (CYANOCOBALAMIN) 1000 MCG tablet Take 1,000 mcg by mouth daily with breakfast.     No current facility-administered medications for this encounter.     Past Medical History  Diagnosis Date  . CKD III-IV     a. since transplant in  2001 with significant worsening since 06/2013.  Marland Kitchen Hypertension     a. Long h/o HTN, came off of all meds following transplant/wt loss-->meds resumed 06/2013, difficult to control since.  Marland Kitchen History of stroke   . Hypercalcemia     a. 06/2013 Hospitalized in [place].  Marland Kitchen Heart transplanted     a. 1995 CABG x 4 in [place], [place];  b. 2001 Developed CHF;  c. 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 10/2000 Cardiac transplant @ [facility], [place];  e. Historically nl Bx and stress testing.  Last stress test ~ 5 yrs ago.  Marland Kitchen Hernia of abdominal wall   . Hyperlipidemia   . Orthostatic hypotension     a. Has tried support hose and compression stockings - prefers not to wear.  . Coronary artery disease   . Myocardial infarction     PRIOR TO HEART TRANSPLANT  . Shortness of breath     EXERTION  . Orthostatic dizziness   . Stroke 06/2013    NO RESIDUAL - WAS CONFUSED AT TIME - THAT HAS RESOLVED  . Diabetes mellitus without complication     FASTING CBG 90S  . Headache(784.0)   . Cancer     SKIN  . Anemia   . History of blood transfusion     Past Surgical History  Procedure Laterality Date  . Cardiac surgery    . Heart transplant    . Cataract extraction    . Vasectomy    . Skin graft full thickness leg      cat scratch that did not heal  . Coronary artery bypass graft    . Eye surgery Bilateral     CATARACTS  . Bascilic vein transposition Left 07/06/2014    Procedure: [place];  Surgeon: Rosetta Posner, MD;  Location: [facility];  Service: Vascular;  Laterality: Left;    History   Social History  . Marital Status: Married    Spouse Name: N/A    Number of Children: N/A  . Years of Education: N/A   Occupational History  . Education officer, community    Social History Main Topics  . Smoking status: Never Smoker   . Smokeless tobacco: Never Used  . Alcohol Use: No  . Drug Use: No  . Sexual Activity: Not on file   Other Topics Concern  . Not on file   Social History Narrative    Patient is from [place].  Moved to [place] in late '90's.  Moved to [place] 06/20/2014 to live closer to son.  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  Plans to continue to work now that he is in [place].  ROS: no fevers or chills, productive cough, hemoptysis, dysphasia, odynophagia, melena, hematochezia, dysuria, hematuria, rash, seizure activity, orthopnea, PND, pedal edema, claudication. Remaining systems are negative.  Filed Vitals:   09/22/14 1154  BP: 171/80  Pulse: 71  Resp: 18  Weight: 162 lb (73.483 kg)  SpO2: 98%    Physical Exam: Well-developed frail in no acute distress.  Skin is warm and dry.  HEENT is normal x for ecchymosis under his left eye Neck is supple.  Chest is clear to auscultation with normal expansion.  Cardiovascular exam is regular rate and rhythm.  Abdominal exam nontender or distended. No masses palpated. + binder Extremities show no edema. + compression hose neuro grossly intact  Assessment/Plan:  1. Chronic systolic HF s/p OHTx 0109 2. CKD, stage 5 3. Orthostatic hypotension 4. DM2 5. Severe HTN  From a cardiac standpoint doing very well nearly 14 years post transplant. Volume status looks good. Main issue is now severe orthostatic hypotension complicated by severe resting hypertension. This is being managed by Dr. Lorrene Reid. Can also consider having him see Dr. Caryl Comes if needed.   From at transplant perspective I explained that we are an advanced HF Center but we do not do transplants here or determine post- transplant care. That said, we are very willing to work with his transplant team in [facility] to obtain all tests needed and help manage his care closely. I have given them my card to share with the transplant coordinator so they can reach out to [place].   Return to clinic in 4 months.   Benay Spice 12:38 PM

## 2014-09-22 NOTE — Addendum Note (Signed)
Encounter addended by: Kerry Dory, CMA on: 09/22/2014 12:59 PM<BR>     Documentation filed: Patient Instructions Section

## 2014-09-24 ENCOUNTER — Ambulatory Visit: Payer: 59 | Admitting: Physical Therapy

## 2014-09-29 ENCOUNTER — Inpatient Hospital Stay (HOSPITAL_COMMUNITY): Admission: RE | Admit: 2014-09-29 | Payer: 59 | Source: Ambulatory Visit

## 2014-09-29 ENCOUNTER — Ambulatory Visit: Payer: 59 | Admitting: Physical Therapy

## 2014-09-29 DIAGNOSIS — I951 Orthostatic hypotension: Secondary | ICD-10-CM | POA: Diagnosis not present

## 2014-10-01 ENCOUNTER — Ambulatory Visit: Payer: 59 | Admitting: Physical Therapy

## 2014-10-01 ENCOUNTER — Inpatient Hospital Stay (HOSPITAL_COMMUNITY): Admission: RE | Admit: 2014-10-01 | Payer: 59 | Source: Ambulatory Visit

## 2014-10-05 ENCOUNTER — Encounter (HOSPITAL_COMMUNITY): Payer: Self-pay | Admitting: Emergency Medicine

## 2014-10-05 ENCOUNTER — Emergency Department (HOSPITAL_COMMUNITY): Payer: 59

## 2014-10-05 ENCOUNTER — Inpatient Hospital Stay (HOSPITAL_COMMUNITY)
Admission: EM | Admit: 2014-10-05 | Discharge: 2014-10-14 | DRG: 682 | Disposition: A | Discharge status: Skilled Nursing Facility | Payer: 59 | Attending: Internal Medicine | Admitting: Internal Medicine

## 2014-10-05 ENCOUNTER — Inpatient Hospital Stay (HOSPITAL_COMMUNITY): Payer: 59

## 2014-10-05 DIAGNOSIS — Z9841 Cataract extraction status, right eye: Secondary | ICD-10-CM | POA: Diagnosis not present

## 2014-10-05 DIAGNOSIS — Z8673 Personal history of transient ischemic attack (TIA), and cerebral infarction without residual deficits: Secondary | ICD-10-CM

## 2014-10-05 DIAGNOSIS — N186 End stage renal disease: Secondary | ICD-10-CM | POA: Diagnosis present

## 2014-10-05 DIAGNOSIS — E039 Hypothyroidism, unspecified: Secondary | ICD-10-CM | POA: Diagnosis present

## 2014-10-05 DIAGNOSIS — E7800 Pure hypercholesterolemia, unspecified: Secondary | ICD-10-CM | POA: Diagnosis present

## 2014-10-05 DIAGNOSIS — E119 Type 2 diabetes mellitus without complications: Secondary | ICD-10-CM | POA: Diagnosis present

## 2014-10-05 DIAGNOSIS — E46 Unspecified protein-calorie malnutrition: Secondary | ICD-10-CM | POA: Diagnosis present

## 2014-10-05 DIAGNOSIS — E1122 Type 2 diabetes mellitus with diabetic chronic kidney disease: Secondary | ICD-10-CM | POA: Diagnosis present

## 2014-10-05 DIAGNOSIS — N179 Acute kidney failure, unspecified: Secondary | ICD-10-CM | POA: Diagnosis present

## 2014-10-05 DIAGNOSIS — I951 Orthostatic hypotension: Secondary | ICD-10-CM | POA: Diagnosis present

## 2014-10-05 DIAGNOSIS — M25561 Pain in right knee: Secondary | ICD-10-CM | POA: Diagnosis present

## 2014-10-05 DIAGNOSIS — R7989 Other specified abnormal findings of blood chemistry: Secondary | ICD-10-CM

## 2014-10-05 DIAGNOSIS — I25811 Atherosclerosis of native coronary artery of transplanted heart without angina pectoris: Secondary | ICD-10-CM | POA: Diagnosis present

## 2014-10-05 DIAGNOSIS — Z681 Body mass index (BMI) 19 or less, adult: Secondary | ICD-10-CM | POA: Diagnosis not present

## 2014-10-05 DIAGNOSIS — Z79899 Other long term (current) drug therapy: Secondary | ICD-10-CM

## 2014-10-05 DIAGNOSIS — Z7982 Long term (current) use of aspirin: Secondary | ICD-10-CM

## 2014-10-05 DIAGNOSIS — R531 Weakness: Secondary | ICD-10-CM

## 2014-10-05 DIAGNOSIS — Z951 Presence of aortocoronary bypass graft: Secondary | ICD-10-CM | POA: Diagnosis not present

## 2014-10-05 DIAGNOSIS — E785 Hyperlipidemia, unspecified: Secondary | ICD-10-CM | POA: Diagnosis present

## 2014-10-05 DIAGNOSIS — Z794 Long term (current) use of insulin: Secondary | ICD-10-CM

## 2014-10-05 DIAGNOSIS — D638 Anemia in other chronic diseases classified elsewhere: Secondary | ICD-10-CM | POA: Diagnosis present

## 2014-10-05 DIAGNOSIS — Z941 Heart transplant status: Secondary | ICD-10-CM | POA: Diagnosis not present

## 2014-10-05 DIAGNOSIS — N2581 Secondary hyperparathyroidism of renal origin: Secondary | ICD-10-CM | POA: Diagnosis present

## 2014-10-05 DIAGNOSIS — Z8249 Family history of ischemic heart disease and other diseases of the circulatory system: Secondary | ICD-10-CM

## 2014-10-05 DIAGNOSIS — I509 Heart failure, unspecified: Secondary | ICD-10-CM | POA: Diagnosis present

## 2014-10-05 DIAGNOSIS — Z992 Dependence on renal dialysis: Secondary | ICD-10-CM | POA: Diagnosis not present

## 2014-10-05 DIAGNOSIS — I10 Essential (primary) hypertension: Secondary | ICD-10-CM | POA: Insufficient documentation

## 2014-10-05 DIAGNOSIS — W19XXXA Unspecified fall, initial encounter: Secondary | ICD-10-CM | POA: Diagnosis present

## 2014-10-05 DIAGNOSIS — I120 Hypertensive chronic kidney disease with stage 5 chronic kidney disease or end stage renal disease: Secondary | ICD-10-CM | POA: Diagnosis present

## 2014-10-05 DIAGNOSIS — Z9842 Cataract extraction status, left eye: Secondary | ICD-10-CM | POA: Diagnosis not present

## 2014-10-05 DIAGNOSIS — R296 Repeated falls: Secondary | ICD-10-CM | POA: Diagnosis present

## 2014-10-05 DIAGNOSIS — I110 Hypertensive heart disease with heart failure: Secondary | ICD-10-CM

## 2014-10-05 DIAGNOSIS — H9190 Unspecified hearing loss, unspecified ear: Secondary | ICD-10-CM | POA: Diagnosis present

## 2014-10-05 DIAGNOSIS — R34 Anuria and oliguria: Secondary | ICD-10-CM | POA: Diagnosis present

## 2014-10-05 DIAGNOSIS — I5022 Chronic systolic (congestive) heart failure: Secondary | ICD-10-CM | POA: Diagnosis present

## 2014-10-05 DIAGNOSIS — N189 Chronic kidney disease, unspecified: Secondary | ICD-10-CM

## 2014-10-05 DIAGNOSIS — I252 Old myocardial infarction: Secondary | ICD-10-CM

## 2014-10-05 DIAGNOSIS — I169 Hypertensive crisis, unspecified: Secondary | ICD-10-CM | POA: Insufficient documentation

## 2014-10-05 DIAGNOSIS — K30 Functional dyspepsia: Secondary | ICD-10-CM | POA: Diagnosis not present

## 2014-10-05 DIAGNOSIS — H9193 Unspecified hearing loss, bilateral: Secondary | ICD-10-CM | POA: Diagnosis present

## 2014-10-05 DIAGNOSIS — N184 Chronic kidney disease, stage 4 (severe): Secondary | ICD-10-CM | POA: Diagnosis present

## 2014-10-05 DIAGNOSIS — E1129 Type 2 diabetes mellitus with other diabetic kidney complication: Secondary | ICD-10-CM | POA: Diagnosis present

## 2014-10-05 DIAGNOSIS — J90 Pleural effusion, not elsewhere classified: Secondary | ICD-10-CM

## 2014-10-05 DIAGNOSIS — M25569 Pain in unspecified knee: Secondary | ICD-10-CM

## 2014-10-05 LAB — CBC WITH DIFFERENTIAL/PLATELET
Basophils Absolute: 0 10*3/uL (ref 0.0–0.1)
Basophils Relative: 1 % (ref 0–1)
EOS ABS: 0 10*3/uL (ref 0.0–0.7)
Eosinophils Relative: 1 % (ref 0–5)
HEMATOCRIT: 41 % (ref 39.0–52.0)
Hemoglobin: 12.3 g/dL — ABNORMAL LOW (ref 13.0–17.0)
LYMPHS ABS: 1.5 10*3/uL (ref 0.7–4.0)
Lymphocytes Relative: 32 % (ref 12–46)
MCH: 29.9 pg (ref 26.0–34.0)
MCHC: 30 g/dL (ref 30.0–36.0)
MCV: 99.5 fL (ref 78.0–100.0)
MONOS PCT: 6 % (ref 3–12)
Monocytes Absolute: 0.3 10*3/uL (ref 0.1–1.0)
Neutro Abs: 2.8 10*3/uL (ref 1.7–7.7)
Neutrophils Relative %: 60 % (ref 43–77)
Platelets: 216 10*3/uL (ref 150–400)
RBC: 4.12 MIL/uL — AB (ref 4.22–5.81)
RDW: 15.6 % — ABNORMAL HIGH (ref 11.5–15.5)
WBC: 4.7 10*3/uL (ref 4.0–10.5)

## 2014-10-05 LAB — BASIC METABOLIC PANEL
Anion gap: 17 — ABNORMAL HIGH (ref 5–15)
BUN: 59 mg/dL — ABNORMAL HIGH (ref 6–23)
CALCIUM: 8.3 mg/dL — AB (ref 8.4–10.5)
CO2: 22 meq/L (ref 19–32)
Chloride: 101 mEq/L (ref 96–112)
Creatinine, Ser: 5.41 mg/dL — ABNORMAL HIGH (ref 0.50–1.35)
GFR calc Af Amer: 11 mL/min — ABNORMAL LOW (ref >90)
GFR calc non Af Amer: 9 mL/min — ABNORMAL LOW (ref >90)
GLUCOSE: 130 mg/dL — AB (ref 70–99)
POTASSIUM: 4.4 meq/L (ref 3.7–5.3)
Sodium: 140 mEq/L (ref 137–147)

## 2014-10-05 LAB — GLUCOSE, CAPILLARY: GLUCOSE-CAPILLARY: 205 mg/dL — AB (ref 70–99)

## 2014-10-05 LAB — TROPONIN I: Troponin I: <0.3 ng/mL (ref <0.30)

## 2014-10-05 LAB — PRO B NATRIURETIC PEPTIDE

## 2014-10-05 MED ORDER — VITAMIN D3 25 MCG (1000 UNIT) PO TABS
1000.0000 [IU] | ORAL_TABLET | Freq: Every day | ORAL | Status: DC
  Administered 2014-10-06 – 2014-10-14 (×9): 1000 [IU] via ORAL
  Filled 2014-10-05 (×9): qty 1

## 2014-10-05 MED ORDER — HEPARIN SODIUM (PORCINE) 5000 UNIT/ML IJ SOLN
5000.0000 [IU] | Freq: Three times a day (TID) | INTRAMUSCULAR | Status: DC
  Administered 2014-10-06 – 2014-10-14 (×23): 5000 [IU] via SUBCUTANEOUS
  Filled 2014-10-05 (×27): qty 1

## 2014-10-05 MED ORDER — DIPHENOXYLATE-ATROPINE 2.5-0.025 MG PO TABS: 1.0000 | ORAL_TABLET | Freq: Every day | ORAL | Status: DC | PRN

## 2014-10-05 MED ORDER — ASPIRIN EC 325 MG PO TBEC
325.0000 mg | DELAYED_RELEASE_TABLET | Freq: Every day | ORAL | Status: DC
  Administered 2014-10-05 – 2014-10-13 (×9): 325 mg via ORAL
  Filled 2014-10-05 (×10): qty 1

## 2014-10-05 MED ORDER — FLUTICASONE PROPIONATE 50 MCG/ACT NA SUSP: 1.0000 | Freq: Every day | NASAL | Status: DC | PRN

## 2014-10-05 MED ORDER — FUROSEMIDE 20 MG PO TABS: 40.0000 mg | ORAL_TABLET | Freq: Two times a day (BID) | ORAL | Status: DC

## 2014-10-05 MED ORDER — PRAVASTATIN SODIUM 10 MG PO TABS
10.0000 mg | ORAL_TABLET | Freq: Every day | ORAL | Status: DC
  Administered 2014-10-06 – 2014-10-14 (×9): 10 mg via ORAL
  Filled 2014-10-05 (×11): qty 1

## 2014-10-05 MED ORDER — MAGNESIUM OXIDE 400 MG PO TABS
400.0000 mg | ORAL_TABLET | Freq: Every day | ORAL | Status: DC
  Administered 2014-10-05 – 2014-10-13 (×9): 400 mg via ORAL
  Filled 2014-10-05 (×10): qty 1

## 2014-10-05 MED ORDER — SODIUM CHLORIDE 0.9 % IJ SOLN
3.0000 mL | Freq: Two times a day (BID) | INTRAMUSCULAR | Status: DC
  Administered 2014-10-05 – 2014-10-14 (×16): 3 mL via INTRAVENOUS

## 2014-10-05 MED ORDER — HYDRALAZINE HCL 20 MG/ML IJ SOLN
10.0000 mg | INTRAMUSCULAR | Status: DC | PRN
  Administered 2014-10-05 – 2014-10-13 (×14): 10 mg via INTRAVENOUS
  Filled 2014-10-05 (×12): qty 1

## 2014-10-05 MED ORDER — ONDANSETRON HCL 4 MG PO TABS
4.0000 mg | ORAL_TABLET | Freq: Four times a day (QID) | ORAL | Status: DC | PRN
  Administered 2014-10-06: 4 mg via ORAL
  Filled 2014-10-05: qty 1

## 2014-10-05 MED ORDER — PROMETHAZINE HCL 25 MG PO TABS
12.5000 mg | ORAL_TABLET | Freq: Four times a day (QID) | ORAL | Status: DC | PRN
  Filled 2014-10-05: qty 1

## 2014-10-05 MED ORDER — METOPROLOL TARTRATE 25 MG PO TABS
25.0000 mg | ORAL_TABLET | Freq: Two times a day (BID) | ORAL | Status: DC
  Administered 2014-10-05: 25 mg via ORAL
  Filled 2014-10-05 (×3): qty 1

## 2014-10-05 MED ORDER — CYCLOSPORINE MODIFIED (NEORAL) 25 MG PO CAPS
50.0000 mg | ORAL_CAPSULE | Freq: Two times a day (BID) | ORAL | Status: DC
  Administered 2014-10-05 – 2014-10-14 (×18): 50 mg via ORAL
  Filled 2014-10-05 (×19): qty 2

## 2014-10-05 MED ORDER — CALCITRIOL 0.25 MCG PO CAPS
0.2500 ug | ORAL_CAPSULE | ORAL | Status: DC
  Administered 2014-10-07 – 2014-10-14 (×4): 0.25 ug via ORAL
  Filled 2014-10-05 (×4): qty 1

## 2014-10-05 MED ORDER — ACETAMINOPHEN 325 MG PO TABS
650.0000 mg | ORAL_TABLET | Freq: Four times a day (QID) | ORAL | Status: DC | PRN
  Administered 2014-10-06: 650 mg via ORAL
  Filled 2014-10-05: qty 2

## 2014-10-05 MED ORDER — ONDANSETRON HCL 4 MG/2ML IJ SOLN: 4.0000 mg | Freq: Four times a day (QID) | INTRAMUSCULAR | Status: DC | PRN

## 2014-10-05 MED ORDER — MORPHINE SULFATE 2 MG/ML IJ SOLN: 2.0000 mg | INTRAMUSCULAR | Status: DC | PRN

## 2014-10-05 MED ORDER — FLUOXETINE HCL 20 MG PO CAPS
20.0000 mg | ORAL_CAPSULE | Freq: Every day | ORAL | Status: DC
  Administered 2014-10-06 – 2014-10-14 (×9): 20 mg via ORAL
  Filled 2014-10-05 (×10): qty 1

## 2014-10-05 MED ORDER — ACETAMINOPHEN 650 MG RE SUPP: 650.0000 mg | Freq: Four times a day (QID) | RECTAL | Status: DC | PRN

## 2014-10-05 MED ORDER — FERROUS SULFATE 325 (65 FE) MG PO TABS
325.0000 mg | ORAL_TABLET | Freq: Three times a day (TID) | ORAL | Status: DC
  Administered 2014-10-06 – 2014-10-14 (×19): 325 mg via ORAL
  Filled 2014-10-05 (×28): qty 1

## 2014-10-05 MED ORDER — LEVOTHYROXINE SODIUM 75 MCG PO TABS
75.0000 ug | ORAL_TABLET | Freq: Every day | ORAL | Status: DC
  Administered 2014-10-06 – 2014-10-14 (×9): 75 ug via ORAL
  Filled 2014-10-05 (×10): qty 1

## 2014-10-05 MED ORDER — MYCOPHENOLATE MOFETIL 250 MG PO CAPS
250.0000 mg | ORAL_CAPSULE | Freq: Two times a day (BID) | ORAL | Status: DC
  Administered 2014-10-05 – 2014-10-14 (×18): 250 mg via ORAL
  Filled 2014-10-05 (×19): qty 1

## 2014-10-05 MED ORDER — PANTOPRAZOLE SODIUM 20 MG PO TBEC
20.0000 mg | DELAYED_RELEASE_TABLET | Freq: Every day | ORAL | Status: DC
  Administered 2014-10-06 – 2014-10-14 (×9): 20 mg via ORAL
  Filled 2014-10-05 (×9): qty 1

## 2014-10-05 MED ORDER — VITAMIN B-12 1000 MCG PO TABS
1000.0000 ug | ORAL_TABLET | Freq: Every day | ORAL | Status: DC
  Administered 2014-10-06 – 2014-10-14 (×9): 1000 ug via ORAL
  Filled 2014-10-05 (×10): qty 1

## 2014-10-05 MED ORDER — SODIUM BICARBONATE 650 MG PO TABS
650.0000 mg | ORAL_TABLET | Freq: Four times a day (QID) | ORAL | Status: DC
  Administered 2014-10-05 – 2014-10-14 (×28): 650 mg via ORAL
  Filled 2014-10-05 (×38): qty 1

## 2014-10-05 NOTE — ED Notes (Signed)
Meal tray ordered 

## 2014-10-05 NOTE — ED Notes (Signed)
Per EMS, pt comes from home with c/o generalized weakness for the past 2 weeks. Pt had difficulty getting from the chair to the commode as stated per wife. Pt was seen in the hospital 2 weeks ago from a fall. Pt A&Ox4, NAD noted. Pt denies chest pain/SOB. Pt has +3 BLE. BP 216/120, cbg 194, RBBB noted on 12 lead. 20G placed in right forearm.

## 2014-10-05 NOTE — ED Provider Notes (Addendum)
CSN: [id number]     Arrival date & time 10/05/14  1224 History   First MD Initiated Contact with Patient 10/05/14 1230     Chief Complaint  Patient presents with  . Weakness      HPI  Nephrology:  Dr. Lorrene Reid  Cardiology: Dr. Tempie Hoist PCP is Dr. Dierdre Forth at [facility].  Patient presents for evaluation of knee pain, weakness, and inability to ambulate.  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 This performed at [facility] in [place]. Moved to [place] in August to be closer to his son. Has had frequent exacerbations with CHF and hypertension or the last few months despite being well controlled for the most part since his surgery.  Has developed a case of fluid in his legs over the last few weeks. 4 days ago, he states he was getting something out of the refrigerator. He was using his cane. He dropped some ice on the floor. He leaned over to pick it up. He was holding onto the refrigerator door which opened. He fell onto the floor. He is unable to get himself up. Paramedics came to the home and helped him into his chair. Since that time he has been unable to get out of his chair without assistance. His son lives next door. Each time he is gotten to the bathroom since that time it is been without his son. Patient states that he is limited by generalized weakness, and pain in his right knee.  Past Medical History  Diagnosis Date  . CKD III-IV     a. since transplant in 2001 with significant worsening since 06/2013.  Marland Kitchen Hypertension     a. Long h/o HTN, came off of all meds following transplant/wt loss-->meds resumed 06/2013, difficult to control since.  Marland Kitchen History of stroke   . Hypercalcemia     a. 06/2013 Hospitalized in [place].  Marland Kitchen Heart transplanted     a. 1995 CABG x 4 in [place], [place];  b. 2001 Developed CHF;  c. 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 10/2000 Cardiac transplant @ [facility], [place];  e.  Historically nl Bx and stress testing.  Last stress test ~ 5 yrs ago.  Marland Kitchen Hernia of abdominal wall   . Hyperlipidemia   . Orthostatic hypotension     a. Has tried support hose and compression stockings - prefers not to wear.  . Coronary artery disease   . Myocardial infarction     PRIOR TO HEART TRANSPLANT  . Shortness of breath     EXERTION  . Orthostatic dizziness   . Stroke 06/2013    NO RESIDUAL - WAS CONFUSED AT TIME - THAT HAS RESOLVED  . Diabetes mellitus without complication     FASTING CBG 90S  . Headache(784.0)   . Cancer     SKIN  . Anemia   . History of blood transfusion    Past Surgical History  Procedure Laterality Date  . Cardiac surgery    . Heart transplant    . Cataract extraction    . Vasectomy    . Skin graft full thickness leg      cat scratch that did not heal  . Coronary artery bypass graft    . Eye surgery Bilateral     CATARACTS  . Bascilic vein transposition Left 07/06/2014    Procedure: [place];  Surgeon: Rosetta Posner, MD;  Location: [facility];  Service: Vascular;  Laterality:  Left;   Family History  Problem Relation Age of Onset  . Heart attack Father     died @ 45  . Heart attack Mother    History  Substance Use Topics  . Smoking status: Never Smoker   . Smokeless tobacco: Never Used  . Alcohol Use: No    Review of Systems  Constitutional: Negative for fever, chills, diaphoresis, appetite change and fatigue.  HENT: Negative for mouth sores, sore throat and trouble swallowing.   Eyes: Negative for visual disturbance.  Respiratory: Negative for cough, chest tightness, shortness of breath and wheezing.   Cardiovascular: Negative for chest pain.  Gastrointestinal: Negative for nausea, vomiting, abdominal pain, diarrhea and abdominal distention.  Endocrine: Negative for polydipsia, polyphagia and polyuria.  Genitourinary: Negative for dysuria, frequency and hematuria.  Musculoskeletal: Positive for arthralgias and gait  problem.  Skin: Negative for color change, pallor and rash.  Neurological: Positive for weakness. Negative for dizziness, syncope, light-headedness and headaches.  Hematological: Does not bruise/bleed easily.  Psychiatric/Behavioral: Negative for behavioral problems and confusion.      Allergies  Norvasc  Home Medications   Prior to Admission medications   Medication Sig Start Date End Date Taking? Authorizing Provider  acetaminophen (TYLENOL) 500 MG tablet Take 1,000 mg by mouth every 6 (six) hours as needed for mild pain.   Yes Historical Provider, MD  aspirin EC 325 MG tablet Take 325 mg by mouth at bedtime.   Yes Historical Provider, MD  calcitRIOL (ROCALTROL) 0.25 MCG capsule Take 0.25 mcg by mouth every Monday, Wednesday, and Friday.   Yes Historical Provider, MD  cholecalciferol (VITAMIN D) 1000 UNITS tablet Take 1,000 Units by mouth daily.   Yes Historical Provider, MD  cycloSPORINE modified (NEORAL) 25 MG capsule Take 50 mg by mouth 2 (two) times daily.  08/18/14  Yes Lelon Perla, MD  diphenoxylate-atropine (LOMOTIL) 2.5-0.025 MG per tablet Take 1 tablet by mouth daily as needed for diarrhea or loose stools.   Yes Historical Provider, MD  ferrous sulfate 325 (65 FE) MG tablet Take 325 mg by mouth 3 (three) times daily with meals.   Yes Historical Provider, MD  FLUoxetine (PROZAC) 20 MG capsule Take 20 mg by mouth daily with breakfast.   Yes Historical Provider, MD  fluticasone (FLONASE) 50 MCG/ACT nasal spray Place 1 spray into both nostrils daily as needed for allergies or rhinitis.   Yes Historical Provider, MD  furosemide (LASIX) 40 MG tablet Take 40 mg by mouth 2 (two) times daily.   Yes Historical Provider, MD  lansoprazole (PREVACID) 15 MG capsule Take 15 mg by mouth 2 (two) times daily.    Yes Historical Provider, MD  levothyroxine (SYNTHROID, LEVOTHROID) 75 MCG tablet Take 75 mcg by mouth daily before breakfast.   Yes Historical Provider, MD  magnesium oxide (MAG-OX)  400 MG tablet Take 400 mg by mouth at bedtime.    Yes Historical Provider, MD  meclizine (ANTIVERT) 50 MG tablet Take 0.5 tablets (25 mg total) by mouth 3 (three) times daily as needed. 08/25/14  Yes Nicole Pisciotta, PA-C  metoprolol tartrate (LOPRESSOR) 25 MG tablet Take 25 mg by mouth 2 (two) times daily.   Yes Historical Provider, MD  mycophenolate (CELLCEPT) 250 MG capsule Take 250 mg by mouth 2 (two) times daily.    Yes Historical Provider, MD  OVER THE COUNTER MEDICATION Apply 1 application topically daily as needed (for toe nail fungus).   Yes Historical Provider, MD  pravastatin (PRAVACHOL) 10 MG tablet Take  10 mg by mouth daily with breakfast.   Yes Historical Provider, MD  Probiotic Product (PROBIOTIC DAILY PO) Take 1 tablet by mouth daily.   Yes Historical Provider, MD  promethazine (PHENERGAN) 12.5 MG tablet Take 12.5 mg by mouth every 6 (six) hours as needed for nausea or vomiting.   Yes Historical Provider, MD  sodium bicarbonate 650 MG tablet Take 650 mg by mouth 4 (four) times daily.    Yes Historical Provider, MD  vitamin B-12 (CYANOCOBALAMIN) 1000 MCG tablet Take 1,000 mcg by mouth daily with breakfast.   Yes Historical Provider, MD   BP 190/124 mmHg  Pulse 77  Temp(Src) 97.4 F (36.3 C) (Oral)  Resp 22  SpO2 100% Physical Exam  Constitutional: He is oriented to person, place, and time. He appears well-developed. No distress.  Thin appearing  HENT:  Head: Normocephalic.  Eyes: Conjunctivae are normal. Pupils are equal, round, and reactive to light. No scleral icterus.  Neck: Normal range of motion. Neck supple. No thyromegaly present.  Cardiovascular: Normal rate and regular rhythm.  Exam reveals no gallop and no friction rub.   No murmur heard. Pulmonary/Chest: Effort normal and breath sounds normal. No respiratory distress. He has no wheezes. He has no rales.  Abdominal: Soft. Bowel sounds are normal. He exhibits no distension. There is no tenderness. There is no  rebound.  Musculoskeletal: Normal range of motion.       Legs: Neurological: He is alert and oriented to person, place, and time.  Skin: Skin is warm and dry. No rash noted.  1+ bilateral lower extremity edema.  Psychiatric: He has a normal mood and affect. His behavior is normal.    ED Course  Procedures (including critical care time) Labs Review Labs Reviewed  CBC WITH DIFFERENTIAL - Abnormal; Notable for the following:    RBC 4.12 (*)    Hemoglobin 12.3 (*)    RDW 15.6 (*)    All other components within normal limits  BASIC METABOLIC PANEL - Abnormal; Notable for the following:    Glucose, Bld 130 (*)    BUN 59 (*)    Creatinine, Ser 5.41 (*)    Calcium 8.3 (*)    GFR calc non Af Amer 9 (*)    GFR calc Af Amer 11 (*)    Anion gap 17 (*)    All other components within normal limits  PRO B NATRIURETIC PEPTIDE - Abnormal; Notable for the following:    Pro B Natriuretic peptide (BNP) >70000.0 (*)    All other components within normal limits  TROPONIN I    Imaging Review Dg Chest 1 View  10/05/2014   CLINICAL DATA:  CHF, cough  EXAM: CHEST - 1 VIEW  COMPARISON:  07/06/2014  FINDINGS: Partially loculated right pleural effusion at the base. Mild bilateral interstitial thickening. No focal consolidation or pneumothorax. There is stable cardiomegaly. There is evidence of prior CABG.  IMPRESSION: 1. Findings concerning for mild CHF. 2. Loculated right pleural effusion.   Electronically Signed   By: Kathreen Devoid   On: 10/05/2014 14:01   Dg Hip Complete Right  10/05/2014   CLINICAL DATA:  Fall 3 days ago. Right hip pain and right knee pain. Swelling  EXAM: RIGHT HIP - COMPLETE 2+ VIEW  COMPARISON:  None.  FINDINGS: The right hip appears located. There is mild osteoarthritis changes and possible chondrocalcinosis noted. There is a mild cortical irregularity involving the superior right pubic rami. Nondisplaced fracture cannot be excluded. Degenerative disc disease  noted within the  lumbar spine.  IMPRESSION: 1. No evidence for hip fracture. 2. Cannot rule out right pubic rami fractures. If there are symptoms referable to this area a CT or MRI of the pelvis may provide a more sensitive assessment for underlying fracture in this area.   Electronically Signed   By: Kerby Moors M.D.   On: 10/05/2014 14:01   Dg Knee Complete 4 Views Right  10/05/2014   CLINICAL DATA:  Right knee pain after fall.  EXAM: RIGHT KNEE - COMPLETE 4+ VIEW  COMPARISON:  None.  FINDINGS: Diffuse osteopenia is noted. Vascular calcifications are noted. No fracture or dislocation is noted. No significant joint effusion is noted. Mild narrowing of the medial lateral joint spaces is noted with chondrocalcinosis.  IMPRESSION: Mild degenerative joint disease is noted. No acute abnormality seen in the right knee.   Electronically Signed   By: Sabino Dick M.D.   On: 10/05/2014 14:01     EKG Interpretation None      MDM   Final diagnoses:  CHF (congestive heart failure)  Knee pain  Fall  Essential hypertension  Azotemia    Weakness and knee pain. We'll x-ray his knee and hip. Well look at his electrolytes, as he states he was recently started back on Lasix after being off for some time. Electrolyte evaluation.  15:58:  Pt Bun 59, Cr. 5.4  Up from 40/3.13 on 10/16.  Patient's blood pressure is been difficult to control as outpatient. He has had continued episodes of orthostatic hypotension, and intermittent marked hypertension. Hypertensive here today. Unable to function at home due to weakness which may be orthostatic-related. Has not been syncopal.    Tanna Furry, MD 10/05/14 [place], MD 10/05/14 [place], MD 10/05/14 [phone]

## 2014-10-05 NOTE — ED Notes (Signed)
Patient is refusing Hydralazine for his blood pressure. His wife is going to return to talk to him about medications he needs per patient, states he want her to be here with him to help him make decisions. Patient states that he is very cautious of BP medications because when he stands up after taking them he passes out.

## 2014-10-05 NOTE — ED Notes (Signed)
Pt states he feel four days ago, was seen for it, but now c/o right knee pain.

## 2014-10-05 NOTE — Consult Note (Signed)
Reason for Consult:AKI/CKD  Referring Physician: Rhetta Mura, MD  [name] [name] is an 73 y.o. male.  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 Jamal Maes) presumably due to DM and calcineurin inhibitor toxicity who presented to [facility] ED complaining of weakness.  He was found to have a markedly elevated SBP in the 200's, and worsening renal function with a Scr of 5.41 (the trend in Scr is seen below).  We were asked to help evaluate and manage his AKI/CKD.  Of note, he is followed by the CHF team and has noted his profound orthostasis and elevated supine SBP's in the 170-180 range was tolerable to prevent more falls.  His EF was preserved and has good allograft function but did have LVH on ECHO.  He first presented to our practice in August of this year and was noted to have a Scr of 6.39 which over the last 2 months improved to 3.13 without any specific therapy but now has a significant jump in only a 4 week period. He did undergo AVF placement by Dr. Sherren Mocha Early on 1/44/81 without complications.  Pt reports that he has been having more falls at home and fell while working with PT a few days ago and hurt his right knee and was not able to transfer from his wheelchair without full assistance (which his wife cannot perform alone).  He has not been feeling well all day today and did not get out of his chair.  He also reports some orthopnea over the last 2-3 days but denies any N/V/D but has noted a drop in his appetite.  He reports that his sitting SBP's have been in the 200-210's at home prior to admission.    Trend in Creatinine: CREATININE, SER  Date/Time Value Ref Range Status  10/05/2014 02:20 PM 5.41* 0.50 - 1.35 mg/dL Final  09/07/2014 12:51 PM 3.13* 0.50 - 1.35 mg/dL Final  08/25/2014 10:19 PM 3.24* 0.50 - 1.35  mg/dL Final  08/25/2014 07:07 PM 3.80* 0.50 - 1.35 mg/dL Final  08/25/2014 05:55 PM 3.52* 0.50 - 1.35 mg/dL Final  07/31/2014 11:32 AM 4.14* 0.50 - 1.35 mg/dL Final  07/10/2014 05:56 AM 5.61* 0.50 - 1.35 mg/dL Final  07/09/2014 05:34 AM 5.72* 0.50 - 1.35 mg/dL Final  07/08/2014 08:14 AM 6.14* 0.50 - 1.35 mg/dL Final  07/07/2014 05:00 AM 6.05* 0.50 - 1.35 mg/dL Final  07/06/2014 02:24 PM 6.39* 0.50 - 1.35 mg/dL Final  06/25/2014 04:20 AM 6.39* 0.50 - 1.35 mg/dL Final  06/24/2014 03:10 AM 5.86* 0.50 - 1.35 mg/dL Final  06/23/2014 03:44 AM 5.66* 0.50 - 1.35 mg/dL Final  06/22/2014 03:40 AM 5.09* 0.50 - 1.35 mg/dL Final  06/21/2014 03:55 AM 4.91* 0.50 - 1.35 mg/dL Final  06/20/2014 05:39 PM 4.76* 0.50 - 1.35 mg/dL Final    PMH:   Past Medical History  Diagnosis Date  . CKD III-IV     a. since transplant in 2001 with significant worsening since 06/2013.  Marland Kitchen Hypertension     a. Long h/o HTN, came off of all meds following transplant/wt loss-->meds resumed 06/2013, difficult to control since.  Marland Kitchen History of stroke   . Hypercalcemia     a. 06/2013 Hospitalized in [place].  Marland Kitchen Heart transplanted     a. 1995 CABG x 4 in [place], [place];  b. 2001 Developed CHF;  c. 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 10/2000 Cardiac transplant @ [facility], [place];  e. Historically nl Bx and stress testing.  Last stress test ~ 5 yrs ago.  Marland Kitchen Hernia of abdominal wall   . Hyperlipidemia   . Orthostatic hypotension     a. Has tried support hose and compression stockings - prefers not to wear.  . Coronary artery disease   . Myocardial infarction     PRIOR TO HEART TRANSPLANT  . Shortness of breath     EXERTION  . Orthostatic dizziness   . Stroke 06/2013    NO RESIDUAL - WAS CONFUSED AT TIME - THAT HAS RESOLVED  . Diabetes mellitus without complication     FASTING CBG 90S  . Headache(784.0)   . Cancer     SKIN  . Anemia   . History of blood transfusion     PSH:   Past Surgical History  Procedure Laterality Date   . Cardiac surgery    . Heart transplant    . Cataract extraction    . Vasectomy    . Skin graft full thickness leg      cat scratch that did not heal  . Coronary artery bypass graft    . Eye surgery Bilateral     CATARACTS  . Bascilic vein transposition Left 07/06/2014    Procedure: [place];  Surgeon: Rosetta Posner, MD;  Location: [facility];  Service: Vascular;  Laterality: Left;    Allergies:  Allergies  Allergen Reactions  . Norvasc [Amlodipine Besylate] Swelling and Other (See Comments)    Swelling of the lower body     Medications:   Prior to Admission medications   Medication Sig Start Date End Date Taking? Authorizing Provider  acetaminophen (TYLENOL) 500 MG tablet Take 1,000 mg by mouth every 6 (six) hours as needed for mild pain.   Yes Historical Provider, MD  aspirin EC 325 MG tablet Take 325 mg by mouth at bedtime.   Yes Historical Provider, MD  calcitRIOL (ROCALTROL) 0.25 MCG capsule Take 0.25 mcg by mouth every Monday, Wednesday, and Friday.   Yes Historical Provider, MD  cholecalciferol (VITAMIN D) 1000 UNITS tablet Take 1,000 Units by mouth daily.   Yes Historical Provider, MD  cycloSPORINE modified (NEORAL) 25 MG capsule Take 50 mg by mouth 2 (two) times daily.  08/18/14  Yes Lelon Perla, MD  diphenoxylate-atropine (LOMOTIL) 2.5-0.025 MG per tablet Take 1 tablet by mouth daily as needed for diarrhea or loose stools.   Yes Historical Provider, MD  ferrous sulfate 325 (65 FE) MG tablet Take 325 mg by mouth 3 (three) times daily with meals.   Yes Historical Provider, MD  FLUoxetine (PROZAC) 20 MG capsule Take 20 mg by mouth daily with breakfast.   Yes Historical Provider, MD  fluticasone (FLONASE) 50 MCG/ACT nasal spray Place 1 spray into both nostrils daily as needed for allergies or rhinitis.   Yes Historical Provider, MD  furosemide (LASIX) 40 MG tablet Take 40 mg by mouth 2 (two) times daily.   Yes Historical Provider, MD  lansoprazole (PREVACID)  15 MG capsule Take 15 mg by mouth 2 (two) times daily.    Yes Historical Provider, MD  levothyroxine (SYNTHROID, LEVOTHROID) 75 MCG tablet Take 75 mcg by mouth daily before breakfast.   Yes Historical Provider, MD  magnesium oxide (MAG-OX) 400 MG tablet Take 400 mg by mouth at bedtime.    Yes Historical Provider, MD  meclizine (ANTIVERT) 50 MG tablet Take 0.5 tablets (25 mg total) by mouth 3 (three) times daily as needed. 08/25/14  Yes Nicole Pisciotta, PA-C  metoprolol tartrate (LOPRESSOR) 25 MG tablet Take 25 mg by mouth 2 (two) times daily.   Yes Historical Provider, MD  mycophenolate (CELLCEPT) 250 MG capsule Take 250 mg by mouth 2 (two) times daily.    Yes Historical Provider, MD  OVER THE COUNTER MEDICATION Apply 1 application topically daily as needed (for toe nail fungus).   Yes Historical Provider, MD  pravastatin (PRAVACHOL) 10 MG tablet Take 10 mg by mouth daily with breakfast.   Yes Historical Provider, MD  Probiotic Product (PROBIOTIC DAILY PO) Take 1 tablet by mouth daily.   Yes Historical Provider, MD  promethazine (PHENERGAN) 12.5 MG tablet Take 12.5 mg by mouth every 6 (six) hours as needed for nausea or vomiting.   Yes Historical Provider, MD  sodium bicarbonate 650 MG tablet Take 650 mg by mouth 4 (four) times daily.    Yes Historical Provider, MD  vitamin B-12 (CYANOCOBALAMIN) 1000 MCG tablet Take 1,000 mcg by mouth daily with breakfast.   Yes Historical Provider, MD    Inpatient medications: . aspirin EC  325 mg Oral QHS  . calcitRIOL  0.25 mcg Oral Q M,W,F  . cholecalciferol  1,000 Units Oral Daily  . cycloSPORINE modified  50 mg Oral BID  . ferrous sulfate  325 mg Oral TID WC  . [START ON 10/06/2014] FLUoxetine  20 mg Oral Q breakfast  . heparin  5,000 Units Subcutaneous 3 times per day  . [START ON 10/06/2014] levothyroxine  75 mcg Oral QAC breakfast  . magnesium oxide  400 mg Oral QHS  . metoprolol tartrate  25 mg Oral BID  . mycophenolate  250 mg Oral BID  .  pantoprazole  20 mg Oral Daily  . [START ON 10/06/2014] pravastatin  10 mg Oral Q breakfast  . sodium bicarbonate  650 mg Oral QID  . sodium chloride  3 mL Intravenous Q12H  . [START ON 10/06/2014] vitamin B-12  1,000 mcg Oral Q breakfast    Discontinued Meds:   Medications Discontinued During This Encounter  Medication Reason  . aspirin 325 MG tablet Entry Error  . metoprolol tartrate (LOPRESSOR) 25 MG tablet Dose change  . ondansetron (ZOFRAN ODT) 8 MG disintegrating tablet Completed Course  . oxyCODONE (ROXICODONE) 5 MG immediate release tablet Completed Course  . saccharomyces boulardii (FLORASTOR) 250 MG capsule Dose change  . furosemide (LASIX) tablet 40 mg     Social History:  reports that he has never smoked. He has never used smokeless tobacco. He reports that he does not drink alcohol or use illicit drugs.  Family History:   Family History  Problem Relation Age of Onset  . Heart attack Father     died @ 87  . Heart attack Mother     Pertinent items are noted in HPI. Weight change:  No intake or output data in the 24 hours ending 10/05/14 1930 BP 206/85 mmHg  Pulse 73  Temp(Src) 97.4 F (36.3 C) (Oral)  Resp 18  SpO2 100% Filed Vitals:   10/05/14 1745 10/05/14 1800 10/05/14 1830 10/05/14 1845  BP: 188/81 204/87 201/85 206/85  Pulse: 72 75 73 73  Temp:      TempSrc:      Resp: 18 20 16 18   SpO2: 100% 100% 99% 100%     General appearance: alert, cooperative and no distress Head: Normocephalic, without  obvious abnormality, atraumatic Eyes: negative findings: lids and lashes normal, conjunctivae and sclerae normal and corneas clear Neck: no adenopathy, no carotid bruit, no JVD, supple, symmetrical, trachea midline and thyroid not enlarged, symmetric, no tenderness/mass/nodules Resp: clear to auscultation bilaterally Cardio: Regular rate and rhythm, II/VI SEM at LLSB, no rub GI: soft, non-tender; bowel sounds normal; no masses,  no organomegaly Extremities:  edema trace pretib/pedal edema and LUE AVF +T/B  Labs: Basic Metabolic Panel:  Recent Labs Lab 10/05/14 1420  NA 140  K 4.4  CL 101  CO2 22  GLUCOSE 130*  BUN 59*  CREATININE 5.41*  CALCIUM 8.3*   Liver Function Tests: No results for input(s): AST, ALT, ALKPHOS, BILITOT, PROT, ALBUMIN in the last 168 hours. No results for input(s): LIPASE, AMYLASE in the last 168 hours. No results for input(s): AMMONIA in the last 168 hours. CBC:  Recent Labs Lab 10/05/14 1420  WBC 4.7  NEUTROABS 2.8  HGB 12.3*  HCT 41.0  MCV 99.5  PLT 216   PT/INR: @LABRCNTIP (inr:5) Cardiac Enzymes: ) Recent Labs Lab 10/05/14 1420  TROPONINI <0.30   CBG: No results for input(s): GLUCAP in the last 168 hours.  Iron Studies: No results for input(s): IRON, TIBC, TRANSFERRIN, FERRITIN in the last 168 hours.  Xrays/Other Studies: Dg Chest 1 View  10/05/2014   CLINICAL DATA:  CHF, cough  EXAM: CHEST - 1 VIEW  COMPARISON:  07/06/2014  FINDINGS: Partially loculated right pleural effusion at the base. Mild bilateral interstitial thickening. No focal consolidation or pneumothorax. There is stable cardiomegaly. There is evidence of prior CABG.  IMPRESSION: 1. Findings concerning for mild CHF. 2. Loculated right pleural effusion.   Electronically Signed   By: Kathreen Devoid   On: 10/05/2014 14:01   Dg Hip Complete Right  10/05/2014   CLINICAL DATA:  Fall 3 days ago. Right hip pain and right knee pain. Swelling  EXAM: RIGHT HIP - COMPLETE 2+ VIEW  COMPARISON:  None.  FINDINGS: The right hip appears located. There is mild osteoarthritis changes and possible chondrocalcinosis noted. There is a mild cortical irregularity involving the superior right pubic rami. Nondisplaced fracture cannot be excluded. Degenerative disc disease noted within the lumbar spine.  IMPRESSION: 1. No evidence for hip fracture. 2. Cannot rule out right pubic rami fractures. If there are symptoms referable to this area a CT or MRI of the  pelvis may provide a more sensitive assessment for underlying fracture in this area.   Electronically Signed   By: Kerby Moors M.D.   On: 10/05/2014 14:01   Ct Chest Wo Contrast  10/05/2014   CLINICAL DATA:  Followup abnormal chest x-ray.  EXAM: CT CHEST WITHOUT CONTRAST  TECHNIQUE: Multidetector CT imaging of the chest was performed following the standard protocol without IV contrast.  COMPARISON:  Chest x-ray, same date.  FINDINGS: Chest wall: No supraclavicular or axillary mass or lymphadenopathy. Diffuse body wall edema is noted. The thyroid gland is grossly normal. The bony thorax is intact. There is a deep compression fracture of A07 of uncertain age. Recommend correlation with back pain.  Mediastinum: The heart is mildly enlarged. No pericardial effusion. There are scattered mediastinal and hilar lymph nodes but no mass or adenopathy. Calcified lymph nodes are also noted. The aorta demonstrates advanced atherosclerotic calcifications but no focal aneurysm. No obvious coronary artery calcifications. The esophagus is grossly normal.  Lungs: There are loculated bilateral pleural fluid collections, right greater than left. No pulmonary edema or worrisome pulmonary lesions.  Upper abdomen: Advanced atherosclerotic calcifications involving the aorta. Calcified granulomas are noted in the spleen. Diffuse body wall edema is noted suggesting anasarca.  IMPRESSION: 1. Extensive bilateral loculated pleural fluid collections, right greater than left. There is associated compressive atelectasis but no focal infiltrates. 2. Mild cardiac enlargement but no mediastinal or hilar mass or adenopathy. 3. Diffuse body wall edema may suggest anasarca. 4. T12 compression fracture of uncertain age.   Electronically Signed   By: Kalman Jewels M.D.   On: 10/05/2014 19:18   Ct Pelvis Wo Contrast  10/05/2014   CLINICAL DATA:  Pelvic pain. Multiple falls in the last 3-4 days. Difficulty moving the left leg. Right hip pain.   EXAM: CT PELVIS WITHOUT CONTRAST  TECHNIQUE: Multidetector CT imaging of the pelvis was performed following the standard protocol without intravenous contrast.  COMPARISON:  Radiographs dated 10/05/2014  FINDINGS: There is no fracture or dislocation. No bone destruction. There are slight degenerative changes of both hip joints with joint space narrowing and small marginal osteophytes. No appreciable joint effusions. Pelvic bones are intact. Prominent chronic calcifications in origins of the inch during tendons bilaterally. No acute abnormalities in those regions. Sacrum and coccyx are intact.  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  The patient is diffuse anasarca.  No mass lesions or adenopathy.  IMPRESSION: No acute osseous abnormality. 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 Minimal degenerative changes of the hips.   Electronically Signed   By: Rozetta Nunnery M.D.   On: 10/05/2014 16:31   Dg Knee Complete 4 Views Right  10/05/2014   CLINICAL DATA:  Right knee pain after fall.  EXAM: RIGHT KNEE - COMPLETE 4+ VIEW  COMPARISON:  None.  FINDINGS: Diffuse osteopenia is noted. Vascular calcifications are noted. No fracture or dislocation is noted. No significant joint effusion is noted. Mild narrowing of the medial lateral joint spaces is noted with chondrocalcinosis.  IMPRESSION: Mild degenerative joint disease is noted. No acute abnormality seen in the right knee.   Electronically Signed   By: Sabino Dick M.D.   On: 10/05/2014 14:01     Assessment/Plan: 1.  AKI/CKD- unclear etiology, he presented similarly in August 2015 and improved without any specific therapy.  Agree with holding lasix for now and following UOP and daily Scr.  No urgent indication for dialysis, however if he develops worsening orthopnea off of diuretics may want to consider initiating dialysis.  He is having mild uremic symptoms with anorexia, however I am very concerned  that dialysis would worsen his already difficult to control orthostatic hypotension. 2. H/o heart transplant- per cardiology notes, excellent allograft function. Cont to follow with CHF team 1. Continue with cellcept and cyclosporine 2. Check levels in am 3. HTN- mainly elevated supine BP readings.  Would recommend checking only sitting BP readings, ideally with his legs over the edge of the bed.  Per Cardiology, they were allowing for supine SBP's in the 170-180 range.   4. Right knee pain- s/p fall, Xray without evidence of fx but pt unable to bear weight and transfer, recommend possible ortho eval 5. Anemia of chronic disease- stable 6. Deconditioning- pt is unable to transfer independently due to weakness and pain, recommend PT/OT evaluation and possible inpt rehab 7. Vascular access- LUE AVF is mature and ready for use if needed (placed 07/06/14) 8. 

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
## 2014-10-05 NOTE — ED Notes (Signed)
Pt states he took his daily medication now. Calcitriol, Pravastatin, Iron, Probiotic, Sodium Bicarb, and Vitamin B12
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
## 2014-10-06 ENCOUNTER — Inpatient Hospital Stay (HOSPITAL_COMMUNITY): Admission: RE | Admit: 2014-10-06 | Payer: 59 | Source: Ambulatory Visit

## 2014-10-06 ENCOUNTER — Inpatient Hospital Stay (HOSPITAL_COMMUNITY): Payer: 59

## 2014-10-06 ENCOUNTER — Ambulatory Visit: Payer: 59 | Admitting: Physical Therapy

## 2014-10-06 DIAGNOSIS — R531 Weakness: Secondary | ICD-10-CM

## 2014-10-06 DIAGNOSIS — I951 Orthostatic hypotension: Secondary | ICD-10-CM

## 2014-10-06 DIAGNOSIS — E1129 Type 2 diabetes mellitus with other diabetic kidney complication: Secondary | ICD-10-CM

## 2014-10-06 DIAGNOSIS — E039 Hypothyroidism, unspecified: Secondary | ICD-10-CM | POA: Diagnosis present

## 2014-10-06 DIAGNOSIS — J90 Pleural effusion, not elsewhere classified: Secondary | ICD-10-CM | POA: Diagnosis present

## 2014-10-06 DIAGNOSIS — N189 Chronic kidney disease, unspecified: Secondary | ICD-10-CM

## 2014-10-06 DIAGNOSIS — E038 Other specified hypothyroidism: Secondary | ICD-10-CM

## 2014-10-06 DIAGNOSIS — H9190 Unspecified hearing loss, unspecified ear: Secondary | ICD-10-CM

## 2014-10-06 DIAGNOSIS — N179 Acute kidney failure, unspecified: Secondary | ICD-10-CM

## 2014-10-06 LAB — URINALYSIS, ROUTINE W REFLEX MICROSCOPIC
Bilirubin Urine: NEGATIVE
GLUCOSE, UA: 100 mg/dL — AB
Ketones, ur: NEGATIVE mg/dL
LEUKOCYTES UA: NEGATIVE
NITRITE: NEGATIVE
Protein, ur: >300 mg/dL — AB
SPECIFIC GRAVITY, URINE: 1.012 (ref 1.005–1.030)
Urobilinogen, UA: 0.2 mg/dL (ref 0.0–1.0)
pH: 7 (ref 5.0–8.0)

## 2014-10-06 LAB — CBC
HEMATOCRIT: 41.8 % (ref 39.0–52.0)
Hemoglobin: 12.5 g/dL — ABNORMAL LOW (ref 13.0–17.0)
MCH: 29.8 pg (ref 26.0–34.0)
MCHC: 29.9 g/dL — ABNORMAL LOW (ref 30.0–36.0)
MCV: 99.8 fL (ref 78.0–100.0)
Platelets: 223 10*3/uL (ref 150–400)
RBC: 4.19 MIL/uL — AB (ref 4.22–5.81)
RDW: 15.8 % — ABNORMAL HIGH (ref 11.5–15.5)
WBC: 7.1 10*3/uL (ref 4.0–10.5)

## 2014-10-06 LAB — COMPREHENSIVE METABOLIC PANEL
ALBUMIN: 2.7 g/dL — AB (ref 3.5–5.2)
ALT: 6 U/L (ref 0–53)
AST: 9 U/L (ref 0–37)
Alkaline Phosphatase: 78 U/L (ref 39–117)
Anion gap: 19 — ABNORMAL HIGH (ref 5–15)
BUN: 61 mg/dL — ABNORMAL HIGH (ref 6–23)
CHLORIDE: 99 meq/L (ref 96–112)
CO2: 22 mEq/L (ref 19–32)
CREATININE: 5.53 mg/dL — AB (ref 0.50–1.35)
Calcium: 8.3 mg/dL — ABNORMAL LOW (ref 8.4–10.5)
GFR calc Af Amer: 11 mL/min — ABNORMAL LOW (ref >90)
GFR calc non Af Amer: 9 mL/min — ABNORMAL LOW (ref >90)
Glucose, Bld: 119 mg/dL — ABNORMAL HIGH (ref 70–99)
Potassium: 3.9 mEq/L (ref 3.7–5.3)
SODIUM: 140 meq/L (ref 137–147)
Total Bilirubin: 0.4 mg/dL (ref 0.3–1.2)
Total Protein: 5.1 g/dL — ABNORMAL LOW (ref 6.0–8.3)

## 2014-10-06 LAB — URINE MICROSCOPIC-ADD ON

## 2014-10-06 LAB — CREATININE, URINE, RANDOM: CREATININE, URINE: 42.24 mg/dL

## 2014-10-06 LAB — SODIUM, URINE, RANDOM: Sodium, Ur: 115 mEq/L

## 2014-10-06 LAB — PHOSPHORUS: Phosphorus: 4.6 mg/dL (ref 2.3–4.6)

## 2014-10-06 MED ORDER — TAMSULOSIN HCL 0.4 MG PO CAPS
0.4000 mg | ORAL_CAPSULE | Freq: Every day | ORAL | Status: DC
  Administered 2014-10-06 – 2014-10-13 (×8): 0.4 mg via ORAL
  Filled 2014-10-06 (×9): qty 1

## 2014-10-06 MED ORDER — SENNOSIDES-DOCUSATE SODIUM 8.6-50 MG PO TABS
1.0000 | ORAL_TABLET | Freq: Every day | ORAL | Status: DC
  Administered 2014-10-06 – 2014-10-13 (×8): 1 via ORAL
  Filled 2014-10-06 (×11): qty 1

## 2014-10-06 MED ORDER — METOPROLOL TARTRATE 25 MG PO TABS
37.5000 mg | ORAL_TABLET | Freq: Two times a day (BID) | ORAL | Status: DC
  Administered 2014-10-06 – 2014-10-14 (×17): 37.5 mg via ORAL
  Filled 2014-10-06 (×19): qty 1

## 2014-10-06 NOTE — Evaluation (Signed)
Physical Therapy Evaluation Patient Details Name: [name] [name] MRN: [id number] DOB: [birth year] Today's Date: 10/06/2014   History of Present Illness  Pt is a 73 y/o male with a PMH of CDK followed by Nephrology, HTN, prior heart transplant done at [facility], CAD who presents to the ED with weakness and multiple falls. The patient was noted to have markedly elevated bp with sbp in the 200's and worsening renal function. Of note, the patient has a hx of sensitivity to aggressive bp meds and has been noted to become hypotensive with treatment.   Clinical Impression  Pt admitted with the above. Pt currently with functional limitations due to the deficits listed below (see PT Problem List). At the time of PT eval pt was limited by dizziness and suspect orthostatic hypotension. Recommend taking orthostatic vitals next session. Pt will benefit from skilled PT to increase their independence and safety with mobility to allow discharge to the venue listed below. Pt agreeable to SNF and states if he were to return home, he does not feel he could enter his home due to the steps.       Follow Up Recommendations SNF;Supervision/Assistance - 24 hour    Equipment Recommendations  None recommended by PT    Recommendations for Other Services       Precautions / Restrictions Precautions Precautions: Fall Restrictions Weight Bearing Restrictions: No      Mobility  Bed Mobility Overal bed mobility: Needs Assistance Bed Mobility: Supine to Sit     Supine to sit: Supervision     General bed mobility comments: Pt was able to transition to EOB with no physical assistance. Increased time and heavy use of bed rails for support. Supervision for safety.   Transfers Overall transfer level: Needs assistance Equipment used: Rolling walker (2 wheeled) Transfers: Sit to/from Stand Sit to Stand: Min assist         General transfer comment: Minimal assistance needed to power-up to full standing  position. VC's for hand placement on seated surface as trying to pull up from walker. Pt also cued to take his time and stand for a moment prior to initiating gait training. States he is not dizzy at this time.   Ambulation/Gait Ambulation/Gait assistance: Min guard Ambulation Distance (Feet): 40 Feet Assistive device: Rolling walker (2 wheeled) Gait Pattern/deviations: Step-through pattern;Decreased stride length;Trunk flexed Gait velocity: Decreased Gait velocity interpretation: Below normal speed for age/gender General Gait Details: Pt ambulated out into the hallway and immediately asked to turn around and go back to the room due to dizziness. Pt able to ambulate back to the recliner without assistance, but appeared relieved to be sitting down. At this time NT entered to take vitals, and BP was 198/85 after ~2 minutes of sitting. Suspect pt became orthostatic during gait training.   Stairs            Wheelchair Mobility    Modified Rankin (Stroke Patients Only)       Balance Overall balance assessment: History of Falls                                           Pertinent Vitals/Pain Pain Assessment: Faces Faces Pain Scale: Hurts a little bit Pain Descriptors / Indicators: Headache Pain Intervention(s): Monitored during session (Pt threw up his medications and cannot get any more pain medication at this time. Cool rag placed on neck and  over top of head.)    Home Living Family/patient expects to be discharged to:: Skilled nursing facility Living Arrangements: Spouse/significant other (Son lives next door)                    Prior Function Level of Independence: Needs assistance   Gait / Transfers Assistance Needed: Pt states he walks with a cane or walker depending on the day and how he feels. States he is limited with distance.   ADL's / Homemaking Assistance Needed: Wife assists with bath every other day and dressing. Also assists with  shoes/socks        Hand Dominance   Dominant Hand: Right    Extremity/Trunk Assessment   Upper Extremity Assessment: Defer to OT evaluation           Lower Extremity Assessment: Generalized weakness      Cervical / Trunk Assessment: Normal (Forward head posture)  Communication   Communication: HOH  Cognition Arousal/Alertness: Awake/alert Behavior During Therapy: Flat affect Overall Cognitive Status: Within Functional Limits for tasks assessed                      General Comments      Exercises        Assessment/Plan    PT Assessment Patient needs continued PT services  PT Diagnosis Difficulty walking;Generalized weakness   PT Problem List Decreased strength;Decreased range of motion;Decreased balance;Decreased activity tolerance;Decreased mobility;Decreased knowledge of use of DME;Decreased safety awareness;Decreased knowledge of precautions  PT Treatment Interventions DME instruction;Gait training;Stair training;Functional mobility training;Therapeutic activities;Therapeutic exercise;Neuromuscular re-education;Patient/family education   PT Goals (Current goals can be found in the Care Plan section) Acute Rehab PT Goals Patient Stated Goal: To return home after rehab at the SNF level PT Goal Formulation: With patient Time For Goal Achievement: 10/20/14 Potential to Achieve Goals: Good    Frequency Min 2X/week   Barriers to discharge        Co-evaluation               End of Session Equipment Utilized During Treatment: Gait belt Activity Tolerance: Treatment limited secondary to medical complications (Comment) (Dizziness) Patient left: in chair;with chair alarm set;with call bell/phone within reach (Chair alarm pad in place. NT to get box as there was not one available in the room.) Nurse Communication: Mobility status         Time: 1696-7893 PT Time Calculation (min) (ACUTE ONLY): 26 min   Charges:   PT Evaluation $Initial PT  Evaluation Tier I: 1 Procedure PT Treatments $Gait Training: 8-22 mins $Therapeutic Activity: 8-22 mins   PT G Codes:          Rolinda Roan 10/06/2014, 10:58 AM  Rolinda Roan, PT, DPT Acute Rehabilitation Services Pager: [phone]

## 2014-10-06 NOTE — Progress Notes (Signed)
Patient presented to [place] for thoracentesis, upon reviewing images minimal pleural fluid seen that is amendable to percutaneous drainage, procedure cancelled.  Tsosie Billing PA-C Interventional Radiology  10/06/14  3:38 PM

## 2014-10-06 NOTE — Progress Notes (Signed)
[facility] PROGRESS NOTE  [name] [name] DXI:[id number] DOB: [birth year] DOA: 10/05/2014 PCP: Lucrezia Starch, MD  Assessment/Plan:  Principal Problem: Malignant hypertension: goal supine SBP 170-180 due to recurrent falls/syncope from orthostatic hypotension.  Increase metoprolol to 37.5 bid and monitor orthostatics. Patient refused iv hydralazine due to concerns of orthostasis. Active Problems:   Weakness generalized: unclear etiology, could be from orthostatic hypotension, deconditioning, worsening renal failure.  No definite evidence of infection, but have asked ir to see whether diagnostic thoracentsis would be possible to r/o indolent infection, empyema. Pt/ot eval pending   Acute on chronic renal failure: renal following   Orthostatic hypotension: see above. Will also place compression hose   Heart transplanted   CKD III-IV   Loculated pleural effusion: chronicity uncertain, have discussed with IR. May need tap if accessible   Hearing impaired: has hearing aides Headache: tylenol prn DM 2, monitor CBGs, continue carb mod. Not on meds per med rec. Hypothyroidism: recent TSH ok  Code Status:  full Family Communication:   Disposition Plan:    Consultants:  nephrology  Procedures:     Antibiotics:    HPI/Subjective: C/o headache x1 hour. No cough, fever, chills, dyspnea, weight loss, n/v/d  Objective: Filed Vitals:   10/06/14 0617  BP: 192/67  Pulse:   Temp:   Resp:     Intake/Output Summary (Last 24 hours) at 10/06/14 0941 Last data filed at 10/06/14 0537  Gross per 24 hour  Intake    483 ml  Output    425 ml  Net     58 ml   Filed Weights   10/05/14 2034  Weight: 72.621 kg (160 lb 1.6 oz)    Exam:   General:  comfortable  Cardiovascular: RRR without MGR  Respiratory: bronchial BS on right, otherwise CTA without WRR  Abdomen: S, NT, ND  Ext: no CCE  Basic Metabolic Panel:  Recent Labs Lab 10/05/14 1420 10/06/14 0700  NA  140 140  K 4.4 3.9  CL 101 99  CO2 22 22  GLUCOSE 130* 119*  BUN 59* 61*  CREATININE 5.41* 5.53*  CALCIUM 8.3* 8.3*  PHOS  --  4.6   Liver Function Tests:  Recent Labs Lab 10/06/14 0700  AST 9  ALT 6  ALKPHOS 78  BILITOT 0.4  PROT 5.1*  ALBUMIN 2.7*   No results for input(s): LIPASE, AMYLASE in the last 168 hours. No results for input(s): AMMONIA in the last 168 hours. CBC:  Recent Labs Lab 10/05/14 1420 10/06/14 0700  WBC 4.7 7.1  NEUTROABS 2.8  --   HGB 12.3* 12.5*  HCT 41.0 41.8  MCV 99.5 99.8  PLT 216 223   Cardiac Enzymes:  Recent Labs Lab 10/05/14 1420  TROPONINI <0.30   BNP (last 3 results)  Recent Labs  10/05/14 1420  PROBNP >70000.0*   CBG:  Recent Labs Lab 10/05/14 2057  GLUCAP 205*    No results found for this or any previous visit (from the past 240 hour(s)).   Studies: Dg Chest 1 View  10/05/2014   CLINICAL DATA:  CHF, cough  EXAM: CHEST - 1 VIEW  COMPARISON:  07/06/2014  FINDINGS: Partially loculated right pleural effusion at the base. Mild bilateral interstitial thickening. No focal consolidation or pneumothorax. There is stable cardiomegaly. There is evidence of prior CABG.  IMPRESSION: 1. Findings concerning for mild CHF. 2. Loculated right pleural effusion.   Electronically Signed   By: Kathreen Devoid   On: 10/05/2014 14:01   Dg  Hip Complete Right  10/05/2014   CLINICAL DATA:  Fall 3 days ago. Right hip pain and right knee pain. Swelling  EXAM: RIGHT HIP - COMPLETE 2+ VIEW  COMPARISON:  None.  FINDINGS: The right hip appears located. There is mild osteoarthritis changes and possible chondrocalcinosis noted. There is a mild cortical irregularity involving the superior right pubic rami. Nondisplaced fracture cannot be excluded. Degenerative disc disease noted within the lumbar spine.  IMPRESSION: 1. No evidence for hip fracture. 2. Cannot rule out right pubic rami fractures. If there are symptoms referable to this area a CT or MRI of  the pelvis may provide a more sensitive assessment for underlying fracture in this area.   Electronically Signed   By: Kerby Moors M.D.   On: 10/05/2014 14:01   Ct Chest Wo Contrast  10/05/2014   CLINICAL DATA:  Followup abnormal chest x-ray.  EXAM: CT CHEST WITHOUT CONTRAST  TECHNIQUE: Multidetector CT imaging of the chest was performed following the standard protocol without IV contrast.  COMPARISON:  Chest x-ray, same date.  FINDINGS: Chest wall: No supraclavicular or axillary mass or lymphadenopathy. Diffuse body wall edema is noted. The thyroid gland is grossly normal. The bony thorax is intact. There is a deep compression fracture of Z12 of uncertain age. Recommend correlation with back pain.  Mediastinum: The heart is mildly enlarged. No pericardial effusion. There are scattered mediastinal and hilar lymph nodes but no mass or adenopathy. Calcified lymph nodes are also noted. The aorta demonstrates advanced atherosclerotic calcifications but no focal aneurysm. No obvious coronary artery calcifications. The esophagus is grossly normal.  Lungs: There are loculated bilateral pleural fluid collections, right greater than left. No pulmonary edema or worrisome pulmonary lesions.  Upper abdomen: Advanced atherosclerotic calcifications involving the aorta. Calcified granulomas are noted in the spleen. Diffuse body wall edema is noted suggesting anasarca.  IMPRESSION: 1. Extensive bilateral loculated pleural fluid collections, right greater than left. There is associated compressive atelectasis but no focal infiltrates. 2. Mild cardiac enlargement but no mediastinal or hilar mass or adenopathy. 3. Diffuse body wall edema may suggest anasarca. 4. T12 compression fracture of uncertain age.   Electronically Signed   By: Kalman Jewels M.D.   On: 10/05/2014 19:18   Ct Pelvis Wo Contrast  10/05/2014   CLINICAL DATA:  Pelvic pain. Multiple falls in the last 3-4 days. Difficulty moving the left leg. Right hip  pain.  EXAM: CT PELVIS WITHOUT CONTRAST  TECHNIQUE: Multidetector CT imaging of the pelvis was performed following the standard protocol without intravenous contrast.  COMPARISON:  Radiographs dated 10/05/2014  FINDINGS: There is no fracture or dislocation. No bone destruction. There are slight degenerative changes of both hip joints with joint space narrowing and small marginal osteophytes. No appreciable joint effusions. Pelvic bones are intact. Prominent chronic calcifications in origins of the inch during tendons bilaterally. No acute abnormalities in those regions. Sacrum and coccyx are intact.  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  The patient is diffuse anasarca.  No mass lesions or adenopathy.  IMPRESSION: No acute osseous abnormality. 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 Minimal degenerative changes of the hips.   Electronically Signed   By: Rozetta Nunnery M.D.   On: 10/05/2014 16:31   Dg Knee Complete 4 Views Right  10/05/2014   CLINICAL DATA:  Right knee pain after fall.  EXAM: RIGHT KNEE - COMPLETE 4+ VIEW  COMPARISON:  None.  FINDINGS:  Diffuse osteopenia is noted. Vascular calcifications are noted. No fracture or dislocation is noted. No significant joint effusion is noted. Mild narrowing of the medial lateral joint spaces is noted with chondrocalcinosis.  IMPRESSION: Mild degenerative joint disease is noted. No acute abnormality seen in the right knee.   Electronically Signed   By: Sabino Dick M.D.   On: 10/05/2014 14:01    Scheduled Meds: . aspirin EC  325 mg Oral QHS  . calcitRIOL  0.25 mcg Oral Q M,W,F  . cholecalciferol  1,000 Units Oral Daily  . cycloSPORINE modified  50 mg Oral BID  . ferrous sulfate  325 mg Oral TID WC  . FLUoxetine  20 mg Oral Q breakfast  . heparin  5,000 Units Subcutaneous 3 times per day  . levothyroxine  75 mcg Oral QAC breakfast  . magnesium oxide  400 mg Oral QHS  . metoprolol  tartrate  25 mg Oral BID  . mycophenolate  250 mg Oral BID  . pantoprazole  20 mg Oral Daily  . pravastatin  10 mg Oral Q breakfast  . sodium bicarbonate  650 mg Oral QID  . sodium chloride  3 mL Intravenous Q12H  . vitamin B-12  1,000 mcg Oral Q breakfast   Continuous Infusions:   Time spent: 35 minutes  [facility] Pager [phone]. If 7PM-7AM, please contact night-coverage at [email or web address], password [facility] 10/06/2014, 9:41 AM  LOS: 1 day

## 2014-10-06 NOTE — Progress Notes (Signed)
Patient ID: [name] [name], male   DOB: May 29, 1941, 73 y.o.   MRN: [id number]  [facility] Progress Note    Assessment/ Plan:   1. 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 Agree with holding lasix for now and following UOP and daily Scr. His poor appetite may be a reflection of early uremic symptoms however, no acute dialysis needs at this time. Given his comorbidities-most prominently orthostatic hypotension, he will likely have a very difficult time at dialysis. 2. H/o heart transplant- per cardiology notes, excellent allograft function. Cont to follow with CHF team, continue antirejection therapy 3. HTN- with supine hypertension with prominent orthostatic drops upon standing-the compromise has been using his sitting (with legs dependent) blood pressures as our yardstick for management.  4. Right knee pain- s/p fall, Xray without evidence of fx but pt unable to bear weight and transfer, recommend possible ortho eval 5. Anemia of chronic disease- stable 6. Deconditioning- pt is unable to transfer independently due to weakness and pain, recommend PT/OT evaluation and possible inpt rehab 7. Vascular access- LUE AVF is mature and ready for use if needed (placed 07/06/14) 8. Metabolic bone disease: Continued on vit D, calcium and phosphorus levels within acceptable limits-PTH pending   Subjective:   He reports to be feeling slightly better-no acute events overnight. Wife at the bedside complaining that he does not drink enough fluids.   Objective:   BP 198/85 mmHg  Pulse 80  Temp(Src) 97.7 F (36.5 C) (Oral)  Resp 17  Ht 6\' 2"  (1.88 m)  Wt 72.621 kg (160 lb 1.6 oz)  BMI 20.55 kg/m2  SpO2 100%  Intake/Output Summary (Last 24 hours) at 10/06/14 1121 Last data filed at 10/06/14 1028  Gross per 24 hour  Intake    603 ml   Output    425 ml  Net    178 ml   Weight change:   Physical Exam: OIL:[id number] sitting up in his recliner VIF:BPPHK regular in rate and rhythm, S1 and S2 normal Resp:poor inspiratory effort-clear to auscultation  FEX:MDYJ, obese, nontender WLK:HVFMB lower extremity edema. Left brachiocephalic fistula has a palpable thrill  Imaging: Dg Chest 1 View  10/05/2014   CLINICAL DATA:  CHF, cough  EXAM: CHEST - 1 VIEW  COMPARISON:  07/06/2014  FINDINGS: Partially loculated right pleural effusion at the base. Mild bilateral interstitial thickening. No focal consolidation or pneumothorax. There is stable cardiomegaly. There is evidence of prior CABG.  IMPRESSION: 1. Findings concerning for mild CHF. 2. Loculated right pleural effusion.   Electronically Signed   By: Kathreen Devoid   On: 10/05/2014 14:01   Dg Hip Complete Right  10/05/2014   CLINICAL DATA:  Fall 3 days ago. Right hip pain and right knee pain. Swelling  EXAM: RIGHT HIP - COMPLETE 2+ VIEW  COMPARISON:  None.  FINDINGS: The right hip appears located. There is mild osteoarthritis changes and possible chondrocalcinosis noted. There is a mild cortical irregularity involving the superior right pubic rami. Nondisplaced fracture cannot be excluded. Degenerative disc disease noted within the lumbar spine.  IMPRESSION: 1. No evidence for hip fracture. 2. Cannot rule out right pubic rami fractures. If there are symptoms referable to this area a CT or MRI of the pelvis may provide a more sensitive assessment for underlying fracture in this area.   Electronically Signed   By: Lovena Le  Clovis Riley M.D.   On: 10/05/2014 14:01   Ct Chest Wo Contrast  10/05/2014   CLINICAL DATA:  Followup abnormal chest x-ray.  EXAM: CT CHEST WITHOUT CONTRAST  TECHNIQUE: Multidetector CT imaging of the chest was performed following the standard protocol without IV contrast.  COMPARISON:  Chest x-ray, same date.  FINDINGS: Chest wall: No supraclavicular or axillary mass or  lymphadenopathy. Diffuse body wall edema is noted. The thyroid gland is grossly normal. The bony thorax is intact. There is a deep compression fracture of K81 of uncertain age. Recommend correlation with back pain.  Mediastinum: The heart is mildly enlarged. No pericardial effusion. There are scattered mediastinal and hilar lymph nodes but no mass or adenopathy. Calcified lymph nodes are also noted. The aorta demonstrates advanced atherosclerotic calcifications but no focal aneurysm. No obvious coronary artery calcifications. The esophagus is grossly normal.  Lungs: There are loculated bilateral pleural fluid collections, right greater than left. No pulmonary edema or worrisome pulmonary lesions.  Upper abdomen: Advanced atherosclerotic calcifications involving the aorta. Calcified granulomas are noted in the spleen. Diffuse body wall edema is noted suggesting anasarca.  IMPRESSION: 1. Extensive bilateral loculated pleural fluid collections, right greater than left. There is associated compressive atelectasis but no focal infiltrates. 2. Mild cardiac enlargement but no mediastinal or hilar mass or adenopathy. 3. Diffuse body wall edema may suggest anasarca. 4. T12 compression fracture of uncertain age.   Electronically Signed   By: Kalman Jewels M.D.   On: 10/05/2014 19:18   Ct Pelvis Wo Contrast  10/05/2014   CLINICAL DATA:  Pelvic pain. Multiple falls in the last 3-4 days. Difficulty moving the left leg. Right hip pain.  EXAM: CT PELVIS WITHOUT CONTRAST  TECHNIQUE: Multidetector CT imaging of the pelvis was performed following the standard protocol without intravenous contrast.  COMPARISON:  Radiographs dated 10/05/2014  FINDINGS: There is no fracture or dislocation. No bone destruction. There are slight degenerative changes of both hip joints with joint space narrowing and small marginal osteophytes. No appreciable joint effusions. Pelvic bones are intact. Prominent chronic calcifications in origins of the  inch during tendons bilaterally. No acute abnormalities in those regions. Sacrum and coccyx are intact.  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  The patient is diffuse anasarca.  No mass lesions or adenopathy.  IMPRESSION: No acute osseous abnormality. 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 Minimal degenerative changes of the hips.   Electronically Signed   By: Rozetta Nunnery M.D.   On: 10/05/2014 16:31   Dg Knee Complete 4 Views Right  10/05/2014   CLINICAL DATA:  Right knee pain after fall.  EXAM: RIGHT KNEE - COMPLETE 4+ VIEW  COMPARISON:  None.  FINDINGS: Diffuse osteopenia is noted. Vascular calcifications are noted. No fracture or dislocation is noted. No significant joint effusion is noted. Mild narrowing of the medial lateral joint spaces is noted with chondrocalcinosis.  IMPRESSION: Mild degenerative joint disease is noted. No acute abnormality seen in the right knee.   Electronically Signed   By: Sabino Dick M.D.   On: 10/05/2014 14:01    Labs: BMET  Recent Labs Lab 10/05/14 1420 10/06/14 0700  NA 140 140  K 4.4 3.9  CL 101 99  CO2 22 22  GLUCOSE 130* 119*  BUN 59* 61*  CREATININE 5.41* 5.53*  CALCIUM 8.3* 8.3*  PHOS  --  4.6   CBC  Recent Labs Lab 10/05/14 1420 10/06/14 0700  WBC 4.7 7.1  NEUTROABS 2.8  --   HGB 12.3* 12.5*  HCT 41.0 41.8  MCV 99.5 99.8  PLT 216 223   Medications:    . aspirin EC  325 mg Oral QHS  . calcitRIOL  0.25 mcg Oral Q M,W,F  . cholecalciferol  1,000 Units Oral Daily  . cycloSPORINE modified  50 mg Oral BID  . ferrous sulfate  325 mg Oral TID WC  . FLUoxetine  20 mg Oral Q breakfast  . heparin  5,000 Units Subcutaneous 3 times per day  . levothyroxine  75 mcg Oral QAC breakfast  . magnesium oxide  400 mg Oral QHS  . metoprolol tartrate  37.5 mg Oral BID  . mycophenolate  250 mg Oral BID  . pantoprazole  20 mg Oral Daily  . pravastatin  10 mg Oral Q breakfast  .  sodium bicarbonate  650 mg Oral QID  . sodium chloride  3 mL Intravenous Q12H  . tamsulosin  0.4 mg Oral QPC supper  . vitamin B-12  1,000 mcg Oral Q breakfast   Elmarie Shiley, MD 10/06/2014, 11:21 AM

## 2014-10-06 NOTE — Progress Notes (Signed)
Utilization review completed.  

## 2014-10-06 NOTE — Progress Notes (Signed)
Received call from patient's wife, who verbalized her worry regarding patient's lack of appetite.  Wished nursing staff to encourage patient to drink fluids.  Offered fluids to patient; however, he refused at this time.  Will continue to offer hydration to patient throughout shift.

## 2014-10-06 NOTE — Evaluation (Signed)
Occupational Therapy Evaluation Patient Details Name: [name] [name] MRN: [id number] DOB: January 14, 1941 Today's Date: 10/06/2014    History of Present Illness Pt is a 73 y/o male with a PMH of CDK followed by Nephrology, HTN, prior heart transplant done at [facility], CAD who presents to the ED with weakness and multiple falls. The patient was noted to have markedly elevated bp with sbp in the 200's and worsening renal function. Of note, the patient has a hx of sensitivity to aggressive bp meds and has been noted to become hypotensive with treatment.    Clinical Impression   Pt admitted with above. Wife assisting pt with ADLs last two weeks. Feel pt will benefit from acute OT to increase independence with BADLs and increase strength. Recommending SNF for rehab. **BP sitting 198/84 and 159/88 standing.    Follow Up Recommendations  SNF;Supervision/Assistance - 24 hour    Equipment Recommendations  None recommended by OT    Recommendations for Other Services       Precautions / Restrictions Precautions Precautions: Fall;orthostatic Restrictions Weight Bearing Restrictions: No      Mobility Bed Mobility      General bed mobility comments: not assessed  Transfers Overall transfer level: Needs assistance Equipment used: Rolling walker (2 wheeled) Transfers: Sit to/from Stand Sit to Stand: Max assist         General transfer comment: assist to boost. Cues for technique.     Balance Overall balance assessment: History of Falls                                          ADL Overall ADL's : Needs assistance/impaired     Grooming: Set up;Sitting           Upper Body Dressing : Set up;Sitting   Lower Body Dressing: Minimal assistance;Sit to/from stand   Toilet Transfer: Maximal assistance;RW (sit to stand from chair)           Functional mobility during ADLs:  (did not ambulate) General ADL Comments: Educated on LB dressing technique. Pt  able to donn/doff socks. Session limited due to BP/dizziness/nausea. Discussed danger of ambulating due to BP.     Vision                     Perception     Praxis      Pertinent Vitals/Pain Pain Assessment: Faces Faces Pain Scale: No hurt  **BP sitting 198/84 and 159/88 standing     Hand Dominance Right   Extremity/Trunk Assessment Upper Extremity Assessment Upper Extremity Assessment: Generalized weakness;LUE deficits/detail LUE Deficits / Details: reports previous shoulder injury; unable to achieve full AROM, but appears [facility]   Lower Extremity Assessment Lower Extremity Assessment: Defer to PT evaluation      Communication Communication Communication: HOH   Cognition Arousal/Alertness: Awake/alert Behavior During Therapy: Flat affect Overall Cognitive Status: Within Functional Limits for tasks assessed                     General Comments       Exercises       Shoulder Instructions      Home Living Family/patient expects to be discharged to:: Skilled nursing facility Living Arrangements: Spouse/significant other (son lives next door)  Additional Comments: Just moved from [facility] pt has toilet riser and BSC; tub/shower      Prior Functioning/Environment Level of Independence: Needs assistance  Gait / Transfers Assistance Needed: Pt states he walks with a cane or walker depending on the day and how he feels. States he is limited with distance.  ADL's / Homemaking Assistance Needed: For last 2 weeks, Wife assists with bath every other day and dressing. Also assists with shoes/socks; before that pt able to dress and bathe himself        OT Diagnosis: Generalized weakness   OT Problem List: Decreased strength;Decreased activity tolerance;Decreased knowledge of use of DME or AE;Decreased knowledge of precautions   OT Treatment/Interventions: Self-care/ADL training;DME and/or AE instruction;Therapeutic  activities;Therapeutic exercise;Patient/family education;Balance training    OT Goals(Current goals can be found in the care plan section) Acute Rehab OT Goals Patient Stated Goal: not stated OT Goal Formulation: With patient/family Time For Goal Achievement: 10/13/14 Potential to Achieve Goals: Good ADL Goals Pt Will Perform Lower Body Dressing: sit to/from stand;with supervision Pt Will Transfer to Toilet: with supervision;ambulating Pt Will Perform Toileting - Clothing Manipulation and hygiene: sit to/from stand;with supervision  OT Frequency: Min 2X/week   Barriers to D/C:            Co-evaluation              End of Session Equipment Utilized During Treatment: Gait belt;Rolling walker Nurse Communication: Other (comment) (BP)  Activity Tolerance: Other (comment) (dizziness/nausea/BP) Patient left: in chair;with call bell/phone within reach;with family/visitor present   Time: 8841-6606 OT Time Calculation (min): 14 min Charges:  OT General Charges $OT Visit: 1 Procedure OT Evaluation $Initial OT Evaluation Tier I: 1 Procedure G-CodesBenito Mccreedy OTR/L [id number] 10/06/2014, 11:45 AM

## 2014-10-07 DIAGNOSIS — N184 Chronic kidney disease, stage 4 (severe): Secondary | ICD-10-CM

## 2014-10-07 LAB — BASIC METABOLIC PANEL WITH GFR
Anion gap: 18 — ABNORMAL HIGH (ref 5–15)
BUN: 59 mg/dL — ABNORMAL HIGH (ref 6–23)
CO2: 21 meq/L (ref 19–32)
Calcium: 8 mg/dL — ABNORMAL LOW (ref 8.4–10.5)
Chloride: 102 meq/L (ref 96–112)
Creatinine, Ser: 5.61 mg/dL — ABNORMAL HIGH (ref 0.50–1.35)
GFR calc Af Amer: 10 mL/min — ABNORMAL LOW
GFR calc non Af Amer: 9 mL/min — ABNORMAL LOW
Glucose, Bld: 96 mg/dL (ref 70–99)
Potassium: 4.2 meq/L (ref 3.7–5.3)
Sodium: 141 meq/L (ref 137–147)

## 2014-10-07 LAB — GLUCOSE, CAPILLARY: Glucose-Capillary: 104 mg/dL — ABNORMAL HIGH (ref 70–99)

## 2014-10-07 MED ORDER — FAMOTIDINE 20 MG PO TABS
20.0000 mg | ORAL_TABLET | Freq: Two times a day (BID) | ORAL | Status: DC | PRN
  Administered 2014-10-07: 20 mg via ORAL
  Filled 2014-10-07 (×2): qty 1

## 2014-10-07 NOTE — Plan of Care (Signed)
Problem: Phase I Progression Outcomes Goal: Pain controlled with appropriate interventions Outcome: Completed/Met Date Met:  10/07/14 Goal: Voiding-avoid urinary catheter unless indicated Outcome: Completed/Met Date Met:  10/07/14

## 2014-10-07 NOTE — Clinical Social Work Note (Signed)
Received consult for SNF placement for patient. Talked with patient and family and they are agreeable to [facility]. Full assessment to follow.  Shaniah Baltes Givens, MSW, LCSW [phone]

## 2014-10-07 NOTE — Progress Notes (Signed)
Patient ID: [name] [name], male   DOB: 09/02/1941, 73 y.o.   MRN: [id number]  [facility] Progress Note    Assessment/ Plan:   1. 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 Urine output charted is in the oliguric range (patient feels that he has voided more than this) and renal function unchanged since yesterday. We'll continue to monitor closely on conservative management to decide on need to start dialysis (patient understands that he will have a difficult time on dialysis due to his hypotension). 2. H/o heart transplant- per cardiology notes, good allograft function. No changes to current antirejection therapy 3. HTN- with supine hypertension with prominent orthostatic drops upon standing-the compromise has been using his sitting (with legs dependent) blood pressures as our yardstick for management.  4. Right knee pain- s/p fall, Xray without evidence of fx but pt unable to bear weight and transfer, recommend possible ortho eval 5. Anemia of chronic disease- stable 6. Deconditioning- pt is unable to transfer independently due to weakness and pain, recommend PT/OT evaluation and possible inpt rehab 7. Vascular access- LUE AVF is mature and ready for use if needed (placed 07/06/14) 8. Metabolic bone disease: Continued on vit D, calcium and phosphorus levels within acceptable limits-PTH pending  Subjective:   No acute events overnight-went down for thoracentesis yesterday evening but did not have the procedure done due to small amount of pleural fluid   Objective:   BP 174/72 mmHg  Pulse 73  Temp(Src) 97.9 F (36.6 C) (Oral)  Resp 17  Ht 6\' 2"  (1.88 m)  Wt 72.621 kg (160 lb 1.6 oz)  BMI 20.55 kg/m2  SpO2 98%  Intake/Output Summary (Last 24 hours) at 10/07/14 1051 Last data filed at 10/07/14 0916  Gross per 24 hour  Intake   1080 ml  Output    200 ml  Net     880 ml   Weight change:   Physical Exam: RAQ:TMAUQJFH comfortably in bed LKT:GYBWL regular in rate and rhythm, S1 and S2 normal Resp:decreased breath sounds at bases otherwise clear SLH:TDSK, flat, nontender AJG:OTLXB bipedal lower extremity edema  Imaging: Dg Chest 1 View  10/05/2014   CLINICAL DATA:  CHF, cough  EXAM: CHEST - 1 VIEW  COMPARISON:  07/06/2014  FINDINGS: Partially loculated right pleural effusion at the base. Mild bilateral interstitial thickening. No focal consolidation or pneumothorax. There is stable cardiomegaly. There is evidence of prior CABG.  IMPRESSION: 1. Findings concerning for mild CHF. 2. Loculated right pleural effusion.   Electronically Signed   By: Kathreen Devoid   On: 10/05/2014 14:01   Dg Hip Complete Right  10/05/2014   CLINICAL DATA:  Fall 3 days ago. Right hip pain and right knee pain. Swelling  EXAM: RIGHT HIP - COMPLETE 2+ VIEW  COMPARISON:  None.  FINDINGS: The right hip appears located. There is mild osteoarthritis changes and possible chondrocalcinosis noted. There is a mild cortical irregularity involving the superior right pubic rami. Nondisplaced fracture cannot be excluded. Degenerative disc disease noted within the lumbar spine.  IMPRESSION: 1. No evidence for hip fracture. 2. Cannot rule out right pubic rami fractures. If there are symptoms referable to this area a CT or MRI of the pelvis may provide a more sensitive assessment for underlying fracture in this area.   Electronically Signed   By: Kerby Moors M.D.   On: 10/05/2014 14:01   Ct Chest Wo Contrast  10/05/2014   CLINICAL DATA:  Followup abnormal chest x-ray.  EXAM: CT CHEST WITHOUT CONTRAST  TECHNIQUE: Multidetector CT imaging of the chest was performed following the standard protocol without IV contrast.  COMPARISON:  Chest x-ray, same date.  FINDINGS: Chest wall: No supraclavicular or axillary mass or lymphadenopathy. Diffuse body wall edema is noted. The thyroid gland is grossly  normal. The bony thorax is intact. There is a deep compression fracture of O03 of uncertain age. Recommend correlation with back pain.  Mediastinum: The heart is mildly enlarged. No pericardial effusion. There are scattered mediastinal and hilar lymph nodes but no mass or adenopathy. Calcified lymph nodes are also noted. The aorta demonstrates advanced atherosclerotic calcifications but no focal aneurysm. No obvious coronary artery calcifications. The esophagus is grossly normal.  Lungs: There are loculated bilateral pleural fluid collections, right greater than left. No pulmonary edema or worrisome pulmonary lesions.  Upper abdomen: Advanced atherosclerotic calcifications involving the aorta. Calcified granulomas are noted in the spleen. Diffuse body wall edema is noted suggesting anasarca.  IMPRESSION: 1. Extensive bilateral loculated pleural fluid collections, right greater than left. There is associated compressive atelectasis but no focal infiltrates. 2. Mild cardiac enlargement but no mediastinal or hilar mass or adenopathy. 3. Diffuse body wall edema may suggest anasarca. 4. T12 compression fracture of uncertain age.   Electronically Signed   By: Kalman Jewels M.D.   On: 10/05/2014 19:18   Ct Pelvis Wo Contrast  10/05/2014   CLINICAL DATA:  Pelvic pain. Multiple falls in the last 3-4 days. Difficulty moving the left leg. Right hip pain.  EXAM: CT PELVIS WITHOUT CONTRAST  TECHNIQUE: Multidetector CT imaging of the pelvis was performed following the standard protocol without intravenous contrast.  COMPARISON:  Radiographs dated 10/05/2014  FINDINGS: There is no fracture or dislocation. No bone destruction. There are slight degenerative changes of both hip joints with joint space narrowing and small marginal osteophytes. No appreciable joint effusions. Pelvic bones are intact. Prominent chronic calcifications in origins of the inch during tendons bilaterally. No acute abnormalities in those regions. Sacrum  and coccyx are intact.  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  The patient is diffuse anasarca.  No mass lesions or adenopathy.  IMPRESSION: No acute osseous abnormality. 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 Minimal degenerative changes of the hips.   Electronically Signed   By: Rozetta Nunnery M.D.   On: 10/05/2014 16:31   [place] Chest  10/06/2014   CLINICAL DATA:  Pleural effusion.  EXAM: CHEST ULTRASOUND  COMPARISON:  CT scan of October 05, 2014.  FINDINGS: Mild amount of fluid is present within the right hemi thorax which appears to be septated. Minimal left pleural effusion is noted.  IMPRESSION: Minimal left pleural effusion. Mild right pleural effusion is noted which appears to be loculated and septated.   Electronically Signed   By: Sabino Dick M.D.   On: 10/06/2014 15:48   Dg Knee Complete 4 Views Right  10/05/2014   CLINICAL DATA:  Right knee pain after fall.  EXAM: RIGHT KNEE - COMPLETE 4+ VIEW  COMPARISON:  None.  FINDINGS: Diffuse osteopenia is noted. Vascular calcifications are noted. No fracture or dislocation is noted. No significant joint effusion is noted. Mild narrowing of the medial lateral joint spaces is noted with chondrocalcinosis.  IMPRESSION: Mild degenerative joint disease is noted. No acute abnormality seen in the right knee.   Electronically Signed   By: Jeneen Rinks  Green M.D.   On: 10/05/2014 14:01    Labs: BMET  Recent Labs Lab 10/05/14 1420 10/06/14 0700 10/07/14 0600  NA 140 140 141  K 4.4 3.9 4.2  CL 101 99 102  CO2 22 22 21   GLUCOSE 130* 119* 96  BUN 59* 61* 59*  CREATININE 5.41* 5.53* 5.61*  CALCIUM 8.3* 8.3* 8.0*  PHOS  --  4.6  --    CBC  Recent Labs Lab 10/05/14 1420 10/06/14 0700  WBC 4.7 7.1  NEUTROABS 2.8  --   HGB 12.3* 12.5*  HCT 41.0 41.8  MCV 99.5 99.8  PLT 216 223   Medications:    . aspirin EC  325 mg Oral QHS  . calcitRIOL  0.25 mcg Oral Q M,W,F  .  cholecalciferol  1,000 Units Oral Daily  . cycloSPORINE modified  50 mg Oral BID  . ferrous sulfate  325 mg Oral TID WC  . FLUoxetine  20 mg Oral Q breakfast  . heparin  5,000 Units Subcutaneous 3 times per day  . levothyroxine  75 mcg Oral QAC breakfast  . magnesium oxide  400 mg Oral QHS  . metoprolol tartrate  37.5 mg Oral BID  . mycophenolate  250 mg Oral BID  . pantoprazole  20 mg Oral Daily  . pravastatin  10 mg Oral Q breakfast  . senna-docusate  1 tablet Oral Daily  . sodium bicarbonate  650 mg Oral QID  . sodium chloride  3 mL Intravenous Q12H  . tamsulosin  0.4 mg Oral QPC supper  . vitamin B-12  1,000 mcg Oral Q breakfast   Elmarie Shiley, MD 10/07/2014, 10:51 AM

## 2014-10-07 NOTE — Progress Notes (Signed)
Pt alert with c/o indigestion at this time. Notified on call, Tylene Fantasia, NP. New orders received.

## 2014-10-07 NOTE — Progress Notes (Signed)
[facility] PROGRESS NOTE  [name] [name] HKV:[id number] DOB: 1940/12/31 DOA: 10/05/2014 PCP: Lucrezia Starch, MD  Assessment/Plan:  Principal Problem: Malignant hypertension: goal supine SBP 170-180 due to recurrent falls/syncope from orthostatic hypotension.  Better on higher metoprolol dose Active Problems:   Weakness generalized: likely multifactorial:  orthostatic hypotension, deconditioning, worsening renal failure.  B12, folate ok. Check cpk and NH3. To SNF when stable   Acute on chronic renal failure: renal following   Orthostatic hypotension: see above   Heart transplanted   CKD III-IV   Loculated pleural effusion: not enough to tap   Hearing impaired: has hearing aides Headache: resolved DM 2, monitor CBGs, continue carb mod. Not on meds per med rec. Hypothyroidism: recent TSH ok Right knee pain: xray negative, nontender, able to bear weight. No evidence of fracture or significant effusion or ligamentous laxity. Ace wrap for comfort. No need for ortho consult   Code Status:  full Family Communication:   Disposition Plan:    Consultants:  nephrology  Procedures:     Antibiotics:    HPI/Subjective: Up with PT today. Able to bear weight on right leg. Feels a little stronger. No h/a  Objective: Filed Vitals:   10/07/14 0915  BP: 174/72  Pulse: 73  Temp: 97.9 F (36.6 C)  Resp: 17    Intake/Output Summary (Last 24 hours) at 10/07/14 1711 Last data filed at 10/07/14 0916  Gross per 24 hour  Intake    840 ml  Output    200 ml  Net    640 ml   Filed Weights   10/05/14 2034  Weight: 72.621 kg (160 lb 1.6 oz)    Exam:   General:  comfortable  Cardiovascular: RRR without MGR  Respiratory: bronchial BS on right, otherwise CTA without WRR  Abdomen: S, NT, ND  Ext: no CCE. Right knee with full rom, no tenderness, warmth or significant effusion.  Basic Metabolic Panel:  Recent Labs Lab 10/05/14 1420 10/06/14 0700 10/07/14 0600   NA 140 140 141  K 4.4 3.9 4.2  CL 101 99 102  CO2 22 22 21   GLUCOSE 130* 119* 96  BUN 59* 61* 59*  CREATININE 5.41* 5.53* 5.61*  CALCIUM 8.3* 8.3* 8.0*  PHOS  --  4.6  --    Liver Function Tests:  Recent Labs Lab 10/06/14 0700  AST 9  ALT 6  ALKPHOS 78  BILITOT 0.4  PROT 5.1*  ALBUMIN 2.7*   No results for input(s): LIPASE, AMYLASE in the last 168 hours. No results for input(s): AMMONIA in the last 168 hours. CBC:  Recent Labs Lab 10/05/14 1420 10/06/14 0700  WBC 4.7 7.1  NEUTROABS 2.8  --   HGB 12.3* 12.5*  HCT 41.0 41.8  MCV 99.5 99.8  PLT 216 223   Cardiac Enzymes:  Recent Labs Lab 10/05/14 1420  TROPONINI <0.30   BNP (last 3 results)  Recent Labs  10/05/14 1420  PROBNP >70000.0*   CBG:  Recent Labs Lab 10/05/14 2057 10/07/14 0553  GLUCAP 205* 104*    No results found for this or any previous visit (from the past 240 hour(s)).   Studies: Ct Chest Wo Contrast  10/05/2014   CLINICAL DATA:  Followup abnormal chest x-ray.  EXAM: CT CHEST WITHOUT CONTRAST  TECHNIQUE: Multidetector CT imaging of the chest was performed following the standard protocol without IV contrast.  COMPARISON:  Chest x-ray, same date.  FINDINGS: Chest wall: No supraclavicular or axillary mass or lymphadenopathy. Diffuse body wall edema  is noted. The thyroid gland is grossly normal. The bony thorax is intact. There is a deep compression fracture of Z61 of uncertain age. Recommend correlation with back pain.  Mediastinum: The heart is mildly enlarged. No pericardial effusion. There are scattered mediastinal and hilar lymph nodes but no mass or adenopathy. Calcified lymph nodes are also noted. The aorta demonstrates advanced atherosclerotic calcifications but no focal aneurysm. No obvious coronary artery calcifications. The esophagus is grossly normal.  Lungs: There are loculated bilateral pleural fluid collections, right greater than left. No pulmonary edema or worrisome pulmonary  lesions.  Upper abdomen: Advanced atherosclerotic calcifications involving the aorta. Calcified granulomas are noted in the spleen. Diffuse body wall edema is noted suggesting anasarca.  IMPRESSION: 1. Extensive bilateral loculated pleural fluid collections, right greater than left. There is associated compressive atelectasis but no focal infiltrates. 2. Mild cardiac enlargement but no mediastinal or hilar mass or adenopathy. 3. Diffuse body wall edema may suggest anasarca. 4. T12 compression fracture of uncertain age.   Electronically Signed   By: Kalman Jewels M.D.   On: 10/05/2014 19:18   [place] Chest  10/06/2014   CLINICAL DATA:  Pleural effusion.  EXAM: CHEST ULTRASOUND  COMPARISON:  CT scan of October 05, 2014.  FINDINGS: Mild amount of fluid is present within the right hemi thorax which appears to be septated. Minimal left pleural effusion is noted.  IMPRESSION: Minimal left pleural effusion. Mild right pleural effusion is noted which appears to be loculated and septated.   Electronically Signed   By: Sabino Dick M.D.   On: 10/06/2014 15:48    Scheduled Meds: . aspirin EC  325 mg Oral QHS  . calcitRIOL  0.25 mcg Oral Q M,W,F  . cholecalciferol  1,000 Units Oral Daily  . cycloSPORINE modified  50 mg Oral BID  . ferrous sulfate  325 mg Oral TID WC  . FLUoxetine  20 mg Oral Q breakfast  . heparin  5,000 Units Subcutaneous 3 times per day  . levothyroxine  75 mcg Oral QAC breakfast  . magnesium oxide  400 mg Oral QHS  . metoprolol tartrate  37.5 mg Oral BID  . mycophenolate  250 mg Oral BID  . pantoprazole  20 mg Oral Daily  . pravastatin  10 mg Oral Q breakfast  . senna-docusate  1 tablet Oral Daily  . sodium bicarbonate  650 mg Oral QID  . sodium chloride  3 mL Intravenous Q12H  . tamsulosin  0.4 mg Oral QPC supper  . vitamin B-12  1,000 mcg Oral Q breakfast   Continuous Infusions:   Time spent: 35 minutes  [facility] Pager [phone]. If 7PM-7AM,  please contact night-coverage at [email or web address], password [facility] 10/07/2014, 5:11 PM  LOS: 2 days

## 2014-10-08 ENCOUNTER — Ambulatory Visit: Payer: 59 | Admitting: Physical Therapy

## 2014-10-08 DIAGNOSIS — E1122 Type 2 diabetes mellitus with diabetic chronic kidney disease: Secondary | ICD-10-CM

## 2014-10-08 LAB — AMMONIA: Ammonia: 27 umol/L (ref 11–60)

## 2014-10-08 LAB — BASIC METABOLIC PANEL
Anion gap: 19 — ABNORMAL HIGH (ref 5–15)
BUN: 59 mg/dL — ABNORMAL HIGH (ref 6–23)
CALCIUM: 8.1 mg/dL — AB (ref 8.4–10.5)
CHLORIDE: 102 meq/L (ref 96–112)
CO2: 20 meq/L (ref 19–32)
CREATININE: 5.89 mg/dL — AB (ref 0.50–1.35)
GFR calc Af Amer: 10 mL/min — ABNORMAL LOW (ref >90)
GFR calc non Af Amer: 9 mL/min — ABNORMAL LOW (ref >90)
GLUCOSE: 74 mg/dL (ref 70–99)
Potassium: 4.7 mEq/L (ref 3.7–5.3)
Sodium: 141 mEq/L (ref 137–147)

## 2014-10-08 LAB — GLUCOSE, CAPILLARY: Glucose-Capillary: 94 mg/dL (ref 70–99)

## 2014-10-08 LAB — CK: Total CK: 206 U/L (ref 7–232)

## 2014-10-08 LAB — HEPATITIS B SURFACE ANTIBODY,QUALITATIVE: HEP B S AB: NEGATIVE

## 2014-10-08 LAB — HEPATITIS B SURFACE ANTIGEN: Hepatitis B Surface Ag: NEGATIVE

## 2014-10-08 MED ORDER — PENTAFLUOROPROP-TETRAFLUOROETH EX AERO: 1.0000 "application " | INHALATION_SPRAY | CUTANEOUS | Status: DC | PRN

## 2014-10-08 MED ORDER — SODIUM CHLORIDE 0.9 % IV SOLN: 100.0000 mL | INTRAVENOUS | Status: DC | PRN

## 2014-10-08 MED ORDER — ALTEPLASE 2 MG IJ SOLR
2.0000 mg | Freq: Once | INTRAMUSCULAR | Status: DC | PRN
  Filled 2014-10-08: qty 2

## 2014-10-08 MED ORDER — HEPARIN SODIUM (PORCINE) 1000 UNIT/ML DIALYSIS
1000.0000 [IU] | INTRAMUSCULAR | Status: DC | PRN
  Filled 2014-10-08: qty 1

## 2014-10-08 MED ORDER — LIDOCAINE-PRILOCAINE 2.5-2.5 % EX CREA: 1.0000 "application " | TOPICAL_CREAM | CUTANEOUS | Status: DC | PRN

## 2014-10-08 MED ORDER — HEPARIN SODIUM (PORCINE) 1000 UNIT/ML DIALYSIS
2000.0000 [IU] | INTRAMUSCULAR | Status: DC | PRN
  Filled 2014-10-08: qty 2

## 2014-10-08 MED ORDER — NEPRO/CARBSTEADY PO LIQD: 237.0000 mL | ORAL | Status: DC | PRN

## 2014-10-08 MED ORDER — LIDOCAINE HCL (PF) 1 % IJ SOLN: 5.0000 mL | INTRAMUSCULAR | Status: DC | PRN

## 2014-10-08 NOTE — Plan of Care (Signed)
Problem: Phase II Progression Outcomes Goal: IV changed to normal saline lock Outcome: Completed/Met Date Met:  10/08/14 Goal: Obtain order to discontinue catheter if appropriate Outcome: Not Applicable Date Met:  88/32/54

## 2014-10-08 NOTE — Progress Notes (Signed)
Patient ID: [name] [name], male   DOB: 01-28-1941, 73 y.o.   MRN: [id number]  [facility] Progress Note   Assessment/ Plan:   1. 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 Fortunately, he is not manifesting any obvious uremic symptoms and does not have any acute electrolyte abnormalities. We discussed starting dialysis before he starts manifesting any of these and he is willing to begin. Hemodialysis will be ordered for today and the process started to get him an outpatient dialysis unit 2. H/o heart transplant- per cardiology notes, good allograft function. No changes to current antirejection therapy 3. HTN- with supine hypertension with prominent orthostatic drops upon standing-the compromise has been using his sitting (with legs dependent) blood pressures as our yardstick for management.  4. Right knee pain- s/p fall, Xray without evidence of fx but pt unable to bear weight and transfer, plans noted for placement to a skilled nursing facility-possibly [place] and rehabilitation (proximity to where he lives) 5. Anemia of chronic disease- stable 6. Vascular access- LUE AVF is mature and ready for use if needed (placed 07/06/14) 7. Metabolic bone disease: Continued on vit D, calcium and phosphorus levels within acceptable limits-check PTH level  Subjective:   Reports that he feels fair-denies any nausea, vomiting or dysgeusia however had some dyspepsia last night.    Objective:   BP 162/77 mmHg  Pulse 74  Temp(Src) 99.1 F (37.3 C) (Oral)  Resp 20  Ht 6\' 2"  (1.88 m)  Wt 72.621 kg (160 lb 1.6 oz)  BMI 20.55 kg/m2  SpO2 96%  Intake/Output Summary (Last 24 hours) at 10/08/14 1014 Last data filed at 10/08/14 0643  Gross per 24 hour  Intake     80 ml  Output    275 ml  Net   -195 ml   Weight change:   Physical Exam: Gen: Comfortably resting in bed, wife at bedside  CVS: Pulse  regular in rate and rhythm, S1 and S2 normal Resp: Fine rales bibasally, no rhonchi Abd: Soft, obese, nontender Ext: Trace to 1+ lower extremity edema  Imaging: [place] Chest  10/06/2014   CLINICAL DATA:  Pleural effusion.  EXAM: CHEST ULTRASOUND  COMPARISON:  CT scan of October 05, 2014.  FINDINGS: Mild amount of fluid is present within the right hemi thorax which appears to be septated. Minimal left pleural effusion is noted.  IMPRESSION: Minimal left pleural effusion. Mild right pleural effusion is noted which appears to be loculated and septated.   Electronically Signed   By: Sabino Dick M.D.   On: 10/06/2014 15:48    Labs: BMET  Recent Labs Lab 10/05/14 1420 10/06/14 0700 10/07/14 0600 10/08/14 0420  NA 140 140 141 141  K 4.4 3.9 4.2 4.7  CL 101 99 102 102  CO2 22 22 21 20   GLUCOSE 130* 119* 96 74  BUN 59* 61* 59* 59*  CREATININE 5.41* 5.53* 5.61* 5.89*  CALCIUM 8.3* 8.3* 8.0* 8.1*  PHOS  --  4.6  --   --    CBC  Recent Labs Lab 10/05/14 1420 10/06/14 0700  WBC 4.7 7.1  NEUTROABS 2.8  --   HGB 12.3* 12.5*  HCT 41.0 41.8  MCV 99.5 99.8  PLT 216 223    Medications:    . aspirin EC  325 mg Oral QHS  . calcitRIOL  0.25 mcg Oral Q M,W,F  . cholecalciferol  1,000 Units Oral Daily  .  cycloSPORINE modified  50 mg Oral BID  . ferrous sulfate  325 mg Oral TID WC  . FLUoxetine  20 mg Oral Q breakfast  . heparin  5,000 Units Subcutaneous 3 times per day  . levothyroxine  75 mcg Oral QAC breakfast  . magnesium oxide  400 mg Oral QHS  . metoprolol tartrate  37.5 mg Oral BID  . mycophenolate  250 mg Oral BID  . pantoprazole  20 mg Oral Daily  . pravastatin  10 mg Oral Q breakfast  . senna-docusate  1 tablet Oral Daily  . sodium bicarbonate  650 mg Oral QID  . sodium chloride  3 mL Intravenous Q12H  . tamsulosin  0.4 mg Oral QPC supper  . vitamin B-12  1,000 mcg Oral Q breakfast   Elmarie Shiley, MD 10/08/2014, 10:14 AM

## 2014-10-08 NOTE — Progress Notes (Signed)
Physical Therapy Treatment Patient Details Name: [name] [name] MRN: [id number] DOB: 09-Nov-1941 Today's Date: 10/08/2014    History of Present Illness Pt is a 73 y/o male with a PMH of CDK followed by Nephrology, HTN, prior heart transplant done at [facility], CAD who presents to the ED with weakness and multiple falls. The patient was noted to have markedly elevated bp with sbp in the 200's and worsening renal function. Of note, the patient has a hx of sensitivity to aggressive bp meds and has been noted to become hypotensive with treatment.     PT Comments    Pt progressing towards physical therapy goals. Pt did not report any dizziness throughout session, which was the main limiting factor for mobility on PT eval. However, pt demonstrated need for increased assist to maintain balance, both in sitting and in standing. Signficant change from PT eval in this aspect.   Orthostatics taken during session: 192/78 in supine, 164/87 in sitting, 156/78 in standing, 187/83 seated after transfer to recliner.   MD present at beginning of session and requests ACE wrap for R knee.  Therapist appropriately wrapped knee and pt reports no pain throughout session.   Follow Up Recommendations  SNF;Supervision/Assistance - 24 hour     Equipment Recommendations  None recommended by PT    Recommendations for Other Services       Precautions / Restrictions Precautions Precautions: Fall Restrictions Weight Bearing Restrictions: No    Mobility  Bed Mobility Overal bed mobility: Needs Assistance Bed Mobility: Supine to Sit     Supine to sit: Min assist     General bed mobility comments: Pt required assistance for trunk support for anterior lean. Pt appeared to have difficulty initiating trunk elevation but also maintaining seated balance EOB. Pt denies any dizziness.   Transfers Overall transfer level: Needs assistance Equipment used: Rolling walker (2 wheeled) Transfers: Sit to/from  Stand Sit to Stand: Mod assist         General transfer comment: Assist to power-up to full standing. Pt with difficulty initiating movement. VC's for proper hand placement. Once standing, pt required mod assist to maintain standing balance, and demonstrated a heavy posterior lean.   Ambulation/Gait Ambulation/Gait assistance: Mod assist Ambulation Distance (Feet): 5 Feet Assistive device: Rolling walker (2 wheeled) Gait Pattern/deviations: Decreased stride length;Shuffle;Trunk flexed Gait velocity: Decreased Gait velocity interpretation: Below normal speed for age/gender General Gait Details: Pt was able to take steps from bed>chair. Assist was required to gain and maintain balance prior to initiation of gait training. Descent to chair was controlled by therapist.    Stairs            Wheelchair Mobility    Modified Rankin (Stroke Patients Only)       Balance Overall balance assessment: Needs assistance Sitting-balance support: Feet supported;Bilateral upper extremity supported Sitting balance-Leahy Scale: Poor     Standing balance support: Bilateral upper extremity supported Standing balance-Leahy Scale: Poor                      Cognition Arousal/Alertness: Awake/alert Behavior During Therapy: Flat affect Overall Cognitive Status: Within Functional Limits for tasks assessed                      Exercises      General Comments        Pertinent Vitals/Pain Pain Assessment: No/denies pain Pain Intervention(s): Other (comment) (Per MD R knee was wrapped with ACE bandage.)  Home Living                      Prior Function            PT Goals (current goals can now be found in the care plan section) Acute Rehab PT Goals Patient Stated Goal: not stated PT Goal Formulation: With patient Time For Goal Achievement: 10/20/14 Potential to Achieve Goals: Good Progress towards PT goals: Progressing toward goals    Frequency   Min 2X/week    PT Plan Current plan remains appropriate    Co-evaluation             End of Session Equipment Utilized During Treatment: Gait belt Activity Tolerance: Patient limited by fatigue Patient left: in chair;with call bell/phone within reach;with family/visitor present     Time: 5277-8242 PT Time Calculation (min) (ACUTE ONLY): 37 min  Charges:  $Gait Training: 8-22 mins $Therapeutic Activity: 8-22 mins                    G Codes:      Rolinda Roan 10-10-2014, 11:46 AM   Rolinda Roan, PT, DPT Acute Rehabilitation Services Pager: [phone]

## 2014-10-08 NOTE — Progress Notes (Signed)
[facility] PROGRESS NOTE  [name] [name] WER:[id number] DOB: 11/09/1941 DOA: 10/05/2014 PCP: Lucrezia Starch, MD  Summary 73 year old white male with multiple medical problems presented with worsening weakness after a fall 2 weeks ago. Workup thus far significant only for worsening renal failure. Has a mature left AV fistula and will start dialysis today.  Assessment/Plan:  Principal Problem: Malignant hypertension: goal supine SBP 170-180 due to recurrent falls/syncope from orthostatic hypotension.  Better on higher metoprolol dose Active Problems:   Weakness generalized: likely multifactorial:  orthostatic hypotension, deconditioning, worsening renal failure.  B12, folate cpk and NH3 ok. To SNF when clipped   Progression of CKD, now ESRD: Will be started on dialysis today and clipping process to be started. Discussed with Dr. Posey Pronto.   Orthostatic hypotension: see above. Was able to ambulate with physical therapy in the hallway yesterday.   Heart transplanted   Loculated pleural effusion: not enough to tap    Hearing impaired: has hearing aides DM 2, monitor CBGs, continue carb mod. Not on meds per med rec. Hypothyroidism: recent TSH ok Right knee pain: xray negative, nontender, able to bear weight. No evidence of fracture or significant effusion or ligamentous laxity. Ace wrap for comfort. No need for ortho consult   Code Status:  full Family Communication:  Wife at bedside Disposition Plan:  Skilled nursing facility after clipped  Consultants:  nephrology  Procedures:     Antibiotics:    HPI/Subjective: No new complaints. Walked to the hallway with physical therapy. No reported difficulty bearing weight on the right leg.  Objective: Filed Vitals:   10/08/14 0740  BP: 162/77  Pulse: 74  Temp:   Resp:     Intake/Output Summary (Last 24 hours) at 10/08/14 1108 Last data filed at 10/08/14 0643  Gross per 24 hour  Intake     80 ml  Output    275 ml   Net   -195 ml   Filed Weights   10/05/14 2034  Weight: 72.621 kg (160 lb 1.6 oz)    Exam:   General:  comfortable  Cardiovascular: RRR without MGR  Respiratory: bronchial BS on right, otherwise CTA without WRR  Abdomen: S, NT, ND  Ext: no CCE. Right knee with full rom, no tenderness, warmth or significant effusion.  Basic Metabolic Panel:  Recent Labs Lab 10/05/14 1420 10/06/14 0700 10/07/14 0600 10/08/14 0420  NA 140 140 141 141  K 4.4 3.9 4.2 4.7  CL 101 99 102 102  CO2 22 22 21 20   GLUCOSE 130* 119* 96 74  BUN 59* 61* 59* 59*  CREATININE 5.41* 5.53* 5.61* 5.89*  CALCIUM 8.3* 8.3* 8.0* 8.1*  PHOS  --  4.6  --   --    Liver Function Tests:  Recent Labs Lab 10/06/14 0700  AST 9  ALT 6  ALKPHOS 78  BILITOT 0.4  PROT 5.1*  ALBUMIN 2.7*   No results for input(s): LIPASE, AMYLASE in the last 168 hours.  Recent Labs Lab 10/08/14 0420  AMMONIA 27   CBC:  Recent Labs Lab 10/05/14 1420 10/06/14 0700  WBC 4.7 7.1  NEUTROABS 2.8  --   HGB 12.3* 12.5*  HCT 41.0 41.8  MCV 99.5 99.8  PLT 216 223   Cardiac Enzymes:  Recent Labs Lab 10/05/14 1420 10/08/14 0420  CKTOTAL  --  206  TROPONINI <0.30  --    BNP (last 3 results)  Recent Labs  10/05/14 1420  PROBNP >70000.0*   CBG:  Recent Labs Lab  10/05/14 2057 10/07/14 0553 10/08/14 0642  GLUCAP 205* 104* 94    No results found for this or any previous visit (from the past 240 hour(s)).   Studies: [place] Chest  10/06/2014   CLINICAL DATA:  Pleural effusion.  EXAM: CHEST ULTRASOUND  COMPARISON:  CT scan of October 05, 2014.  FINDINGS: Mild amount of fluid is present within the right hemi thorax which appears to be septated. Minimal left pleural effusion is noted.  IMPRESSION: Minimal left pleural effusion. Mild right pleural effusion is noted which appears to be loculated and septated.   Electronically Signed   By: Sabino Dick M.D.   On: 10/06/2014 15:48    Scheduled Meds: . aspirin  EC  325 mg Oral QHS  . calcitRIOL  0.25 mcg Oral Q M,W,F  . cholecalciferol  1,000 Units Oral Daily  . cycloSPORINE modified  50 mg Oral BID  . ferrous sulfate  325 mg Oral TID WC  . FLUoxetine  20 mg Oral Q breakfast  . heparin  5,000 Units Subcutaneous 3 times per day  . levothyroxine  75 mcg Oral QAC breakfast  . magnesium oxide  400 mg Oral QHS  . metoprolol tartrate  37.5 mg Oral BID  . mycophenolate  250 mg Oral BID  . pantoprazole  20 mg Oral Daily  . pravastatin  10 mg Oral Q breakfast  . senna-docusate  1 tablet Oral Daily  . sodium bicarbonate  650 mg Oral QID  . sodium chloride  3 mL Intravenous Q12H  . tamsulosin  0.4 mg Oral QPC supper  . vitamin B-12  1,000 mcg Oral Q breakfast   Continuous Infusions:   Time spent: 35 minutes  [facility] Pager [phone]. If 7PM-7AM, please contact night-coverage at [email or web address], password [facility] 10/08/2014, 11:08 AM  LOS: 3 days

## 2014-10-09 DIAGNOSIS — Z992 Dependence on renal dialysis: Secondary | ICD-10-CM

## 2014-10-09 LAB — CBC
HEMATOCRIT: 41.9 % (ref 39.0–52.0)
HEMOGLOBIN: 12.7 g/dL — AB (ref 13.0–17.0)
MCH: 29.7 pg (ref 26.0–34.0)
MCHC: 30.3 g/dL (ref 30.0–36.0)
MCV: 98.1 fL (ref 78.0–100.0)
Platelets: 175 10*3/uL (ref 150–400)
RBC: 4.27 MIL/uL (ref 4.22–5.81)
RDW: 15.5 % (ref 11.5–15.5)
WBC: 4.8 10*3/uL (ref 4.0–10.5)

## 2014-10-09 LAB — HEPATITIS B CORE ANTIBODY, TOTAL: Hep B Core Total Ab: NONREACTIVE

## 2014-10-09 LAB — GLUCOSE, CAPILLARY
GLUCOSE-CAPILLARY: 98 mg/dL (ref 70–99)
Glucose-Capillary: 144 mg/dL — ABNORMAL HIGH (ref 70–99)
Glucose-Capillary: 99 mg/dL (ref 70–99)
Glucose-Capillary: 146 mg/dL — ABNORMAL HIGH (ref 70–99)

## 2014-10-09 LAB — RENAL FUNCTION PANEL
Albumin: 2.5 g/dL — ABNORMAL LOW (ref 3.5–5.2)
Anion gap: 16 — ABNORMAL HIGH (ref 5–15)
BUN: 43 mg/dL — AB (ref 6–23)
CO2: 24 meq/L (ref 19–32)
Calcium: 8.1 mg/dL — ABNORMAL LOW (ref 8.4–10.5)
Chloride: 100 mEq/L (ref 96–112)
Creatinine, Ser: 4.91 mg/dL — ABNORMAL HIGH (ref 0.50–1.35)
GFR calc Af Amer: 12 mL/min — ABNORMAL LOW (ref >90)
GFR calc non Af Amer: 11 mL/min — ABNORMAL LOW (ref >90)
Glucose, Bld: 108 mg/dL — ABNORMAL HIGH (ref 70–99)
Phosphorus: 4.1 mg/dL (ref 2.3–4.6)
Potassium: 3.8 mEq/L (ref 3.7–5.3)
Sodium: 140 mEq/L (ref 137–147)

## 2014-10-09 NOTE — Procedures (Signed)
Patient seen on Hemodialysis. QB 250, UF goal 1.5L Treatment adjusted as needed.  Elmarie Shiley MD [facility] Office # [phone] Pager # [phone] 8:52 AM

## 2014-10-09 NOTE — Clinical Social Work Placement (Addendum)
Clinical Social Work Department CLINICAL SOCIAL WORK PLACEMENT NOTE 10/09/2014  Patient:  [name] [name],[name] [name]  Account Number:  [id number] Admit date:  10/05/2014  Clinical Social Worker:  Tytiana Coles Givens, LCSW  Date/time:  10/09/2014 08:52 AM  Clinical Social Work is seeking post-discharge placement for this patient at the following level of care:   [place]   (*CSW will update this form in Epic as items are completed)   10/07/2014  Patient/family provided with [place] Department of Clinical Social Work's list of facilities offering this level of care within the geographic area requested by the patient (or if unable, by the patient's family).  10/07/2014  Patient/family informed of their freedom to choose among providers that offer the needed level of care, that participate in Medicare, Medicaid or managed care program needed by the patient, have an available bed and are willing to accept the patient.    Patient/family informed of [facility]' ownership interest in [facility], as well as of the fact that they are under no obligation to receive care at this facility.  PASARR submitted to EDS on 10/09/14 PASARR number received on 10/09/14  FL2 transmitted to all facilities in geographic area requested by pt/family on  10/08/2014 FL2 transmitted to all facilities within larger geographic area on   Patient informed that his/her managed care company has contracts with or will negotiate with  certain facilities, including the following:    Patient/family informed of bed offers received: 10/09/14  Patient chooses bed at [facility] Physician recommends and patient chooses bed at    Patient to be transferred to [facility] on 10/14/14   Patient to be transferred to facility by ambulance Patient and family notified of transfer on 10/14/14  Name of family member notified: Wife, Johann Santone at the bedside.  The following physician request were entered in  Epic:  Additional Comments: 10/09/14: Patient is new dialysis and is being set-up at [facility]. Call made and message left for [facility], admissions director at [facility] skilled facility to accept bed offer.

## 2014-10-09 NOTE — Progress Notes (Signed)
Physical Therapy Treatment Patient Details Name: [name] [name] MRN: [id number] DOB: [birth year] Today's Date: 10/09/2014    History of Present Illness Pt is a 73 y/o male with a PMH of CDK followed by Nephrology, HTN, prior heart transplant done at [facility], CAD who presents to the ED with weakness and multiple falls. The patient was noted to have markedly elevated bp with sbp in the 200's and worsening renal function. Of note, the patient has a hx of sensitivity to aggressive bp meds and has been noted to become hypotensive with treatment.     PT Comments    Pt progressing towards physical therapy goals. Continues to be limited with ambulation due to dizziness/orthostatics. Pt denies any R knee pain throughout session. Continue to recommend SNF at d/c for continued strengthening and balance training to improve independence and overall safety.   Orthostatic vitals taken during session: 185/78 in supine, 169/82 in sitting, 121/72 in standing  Follow Up Recommendations  SNF;Supervision/Assistance - 24 hour     Equipment Recommendations  None recommended by PT    Recommendations for Other Services       Precautions / Restrictions Precautions Precautions: Fall Restrictions Weight Bearing Restrictions: No    Mobility  Bed Mobility Overal bed mobility: Needs Assistance Bed Mobility: Supine to Sit     Supine to sit: Min assist     General bed mobility comments: Pt required assistance for trunk support for anterior lean. Pt appeared to have difficulty initiating trunk elevation but also maintaining seated balance EOB. Pt denies any dizziness.   Transfers Overall transfer level: Needs assistance Equipment used: Rolling walker (2 wheeled) Transfers: Sit to/from Stand Sit to Stand: Min assist;Min guard         General transfer comment: Pt required assistance to power up to full standing initially, and on second attempt required min guard. Pt had difficulty maintaining  standing while BP was taken, stating he felt dizzy. Steadying assist required.   Ambulation/Gait Ambulation/Gait assistance: Min guard Ambulation Distance (Feet): 30 Feet Assistive device: Rolling walker (2 wheeled) Gait Pattern/deviations: Step-through pattern;Decreased stride length;Trunk flexed Gait velocity: Decreased Gait velocity interpretation: Below normal speed for age/gender General Gait Details: VC's for improved posture. Pt moving very quickly from one place to another, however overall gait speed was still decreased even though he was rushing.    Stairs            Wheelchair Mobility    Modified Rankin (Stroke Patients Only)       Balance Overall balance assessment: Needs assistance Sitting-balance support: Feet supported;No upper extremity supported Sitting balance-Leahy Scale: Poor     Standing balance support: Bilateral upper extremity supported Standing balance-Leahy Scale: Poor                      Cognition Arousal/Alertness: Awake/alert Behavior During Therapy: Flat affect Overall Cognitive Status: Within Functional Limits for tasks assessed                      Exercises      General Comments        Pertinent Vitals/Pain Pain Assessment: No/denies pain    Home Living                      Prior Function            PT Goals (current goals can now be found in the care plan section) Acute Rehab PT Goals Patient Stated  Goal: not stated PT Goal Formulation: With patient Time For Goal Achievement: 10/20/14 Potential to Achieve Goals: Good Progress towards PT goals: Progressing toward goals    Frequency  Min 2X/week    PT Plan Current plan remains appropriate    Co-evaluation PT/OT/SLP Co-Evaluation/Treatment: Yes Reason for Co-Treatment: For patient/therapist safety (Pt orthostatic and began HD today.) PT goals addressed during session: Mobility/safety with mobility;Balance;Proper use of DME        End of Session Equipment Utilized During Treatment: Gait belt Activity Tolerance: Patient limited by fatigue Patient left: in chair;with call bell/phone within reach;with family/visitor present     Time: 6837-2902 PT Time Calculation (min) (ACUTE ONLY): 29 min  Charges:  $Gait Training: 8-22 mins                    G Codes:      Rolinda Roan 11-04-2014, 4:10 PM  Rolinda Roan, PT, DPT Acute Rehabilitation Services Pager: [phone]

## 2014-10-09 NOTE — Clinical Social Work Psychosocial (Signed)
Clinical Social Work Department BRIEF PSYCHOSOCIAL ASSESSMENT 10/09/2014  Patient:  Finan,Demarus     Account Number:  [id number]     Admit date:  10/05/2014  Clinical Social Worker:  Frederico Hamman  Date/Time:  10/09/2014 08:41 AM  Referred by:  Physician  Date Referred:  10/07/2014 Referred for  SNF Placement   Other Referral:   Interview type:  Patient Other interview type:   CSW talked with patient, his wife and son who were at the bedside.    PSYCHOSOCIAL DATA Living Status:  WIFE Admitted from facility:   Level of care:   Primary support name:  Aldin Drees Primary support relationship to patient:  SPOUSE Degree of support available:   Strong support from wife and son [name].    CURRENT CONCERNS Current Concerns  Post-Acute Placement   Other Concerns:    SOCIAL WORK ASSESSMENT / PLAN CSW talked with patient and family about discharge plans and recommendation of ST rehab. Initially wife stated that patient would be going home, however patient and then son indicated that [place] rehab at a facility was desired. CSW talked with family about SNF search process and provided a list of facilities in [place]. CSW advised that they live in [place] and would like the skilled facility that is out there. CSW also informed that patient may go on dialysis and if so they want the dialysis center in [facility].   Assessment/plan status:  Psychosocial Support/Ongoing Assessment of Needs Other assessment/ plan:   Information/referral to community resources:   Skilled facility list for [facility]    PATIENT'S/FAMILY'S RESPONSE TO PLAN OF CARE: Patient and family very receptive to talking with CSW about ST rehab. and were engaged in the conversation.

## 2014-10-09 NOTE — Progress Notes (Signed)
Occupational Therapy Treatment Patient Details Name: [name] [name] MRN: [id number] DOB: 1941-01-23 Today's Date: 10/09/2014    History of present illness Pt is a 73 y/o male with a PMH of CDK followed by Nephrology, HTN, prior heart transplant done at [facility], CAD who presents to the ED with weakness and multiple falls. The patient was noted to have markedly elevated bp with sbp in the 200's and worsening renal function. Of note, the patient has a hx of sensitivity to aggressive bp meds and has been noted to become hypotensive with treatment.    OT comments  Standing ADL and and ADL transfers continue to be limited by pt's orthostasis and dizziness.  Instructed in safety to prevent falling in sitting.   See PT note for orthostatic blood pressures.  Follow Up Recommendations  SNF;Supervision/Assistance - 24 hour    Equipment Recommendations  None recommended by OT    Recommendations for Other Services      Precautions / Restrictions Precautions Precautions: Fall Restrictions Weight Bearing Restrictions: No       Mobility Bed Mobility Overal bed mobility: Needs Assistance Bed Mobility: Supine to Sit;Sit to Supine     Supine to sit: Min assist Sit to supine: Supervision   General bed mobility comments: Pt required assistance for trunk support for anterior lean. Pt appeared to have difficulty initiating trunk elevation but also maintaining seated balance EOB. Pt denies any dizziness.   Transfers Overall transfer level: Needs assistance Equipment used: Rolling walker (2 wheeled) Transfers: Sit to/from Stand Sit to Stand: Min assist;Min guard         General transfer comment: Pt required assistance to power up to full standing initially, and on second attempt required min guard. Pt had difficulty maintaining standing while BP was taken, stating he felt dizzy. Steadying assist required.     Balance Overall balance assessment: Needs assistance Sitting-balance  support: Feet supported;No upper extremity supported Sitting balance-Leahy Scale: Poor     Standing balance support: Bilateral upper extremity supported Standing balance-Leahy Scale: Poor                     ADL Overall ADL's : Needs assistance/impaired     Grooming: Set up;Sitting;Wash/dry hands                   Toilet Transfer: Minimal assistance;RW;Ambulation           Functional mobility during ADLs: Minimal assistance;+2 for safety/equipment;Rolling walker (second person due to orthostasis/dizziness) General ADL Comments: Instructed to avoid bending toward floor or feet during ADL to avoid falls.      Vision                     Perception     Praxis      Cognition   Behavior During Therapy: Flat affect Overall Cognitive Status: History of cognitive impairments - at baseline                       Extremity/Trunk Assessment               Exercises     Shoulder Instructions       General Comments      Pertinent Vitals/ Pain       Pain Assessment: No/denies pain  Home Living  Prior Functioning/Environment              Frequency Min 2X/week     Progress Toward Goals  OT Goals(current goals can now be found in the care plan section)  Progress towards OT goals: Progressing toward goals  Acute Rehab OT Goals Patient Stated Goal: not stated Time For Goal Achievement: 10/13/14  Plan Discharge plan remains appropriate    Co-evaluation    PT/OT/SLP Co-Evaluation/Treatment: Yes Reason for Co-Treatment: For patient/therapist safety PT goals addressed during session: Mobility/safety with mobility;Balance;Proper use of DME OT goals addressed during session: ADL's and self-care      End of Session     Activity Tolerance Other (comment) (dizziness in standing)   Patient Left in bed;with call bell/phone within reach;with family/visitor present    Nurse Communication          Time: 8485-9276 OT Time Calculation (min): 29 min  Charges: OT General Charges $OT Visit: 1 Procedure OT Treatments $Therapeutic Activity: 8-22 mins  Malka So 10/09/2014, 4:27 PM  [phone]

## 2014-10-09 NOTE — Progress Notes (Signed)
[facility] PROGRESS NOTE  [name] [name] JQB:[id number] DOB: 1941/06/23 DOA: 10/05/2014 PCP: Lucrezia Starch, MD  Summary 73 year old white male with multiple medical problems presented with worsening weakness after a fall 2 weeks ago. Workup thus far significant only for worsening renal failure. Has a mature left AV fistula and will start dialysis today.  Assessment/Plan: Malignant hypertension: goal supine SBP 170-180 due to recurrent falls/syncope from orthostatic hypotension.  Better on higher metoprolol dose    Weakness generalized: likely multifactorial:  orthostatic hypotension, deconditioning, worsening renal failure.  B12, folate cpk and NH3 ok. To SNF when clipped   Progression of CKD, now ESRD: dialysis and clipping process to be started. nephrology   Orthostatic hypotension: see above. Was able to ambulate with physical therapy in the hallway yesterday.   Heart transplanted   Loculated pleural effusion: not enough to tap    Hearing impaired: has hearing aides DM 2, monitor CBGs, continue carb mod. Not on meds per med rec. Hypothyroidism: recent TSH ok Right knee pain: xray negative, nontender, able to bear weight. No evidence of fracture or significant effusion or ligamentous laxity. Ace wrap for comfort. No need for ortho consult   Code Status:  full Family Communication:  Wife at bedside Disposition Plan:  Skilled nursing facility after clipped  Consultants:  nephrology  Procedures:     Antibiotics:    HPI/Subjective: Did fine in dialysis  Objective: Filed Vitals:   10/09/14 1030  BP: 162/81  Pulse: 73  Temp:   Resp:     Intake/Output Summary (Last 24 hours) at 10/09/14 1100 Last data filed at 10/09/14 0451  Gross per 24 hour  Intake    240 ml  Output   1300 ml  Net  -1060 ml   Filed Weights   10/08/14 1330 10/08/14 1543 10/09/14 0822  Weight: 74.1 kg (163 lb 5.8 oz) 72.4 kg (159 lb 9.8 oz) 73.3 kg (161 lb 9.6 oz)     Exam:   General:  comfortable  Cardiovascular: RRR without MGR  Respiratory: bronchial BS on right, otherwise CTA without WRR  Abdomen: S, NT, ND  Ext: no CCE. Right knee with full rom, no tenderness, warmth or significant effusion.  Basic Metabolic Panel:  Recent Labs Lab 10/05/14 1420 10/06/14 0700 10/07/14 0600 10/08/14 0420 10/09/14 0841  NA 140 140 141 141 140  K 4.4 3.9 4.2 4.7 3.8  CL 101 99 102 102 100  CO2 22 22 21 20 24   GLUCOSE 130* 119* 96 74 108*  BUN 59* 61* 59* 59* 43*  CREATININE 5.41* 5.53* 5.61* 5.89* 4.91*  CALCIUM 8.3* 8.3* 8.0* 8.1* 8.1*  PHOS  --  4.6  --   --  4.1   Liver Function Tests:  Recent Labs Lab 10/06/14 0700 10/09/14 0841  AST 9  --   ALT 6  --   ALKPHOS 78  --   BILITOT 0.4  --   PROT 5.1*  --   ALBUMIN 2.7* 2.5*   No results for input(s): LIPASE, AMYLASE in the last 168 hours.  Recent Labs Lab 10/08/14 0420  AMMONIA 27   CBC:  Recent Labs Lab 10/05/14 1420 10/06/14 0700 10/09/14 0841  WBC 4.7 7.1 4.8  NEUTROABS 2.8  --   --   HGB 12.3* 12.5* 12.7*  HCT 41.0 41.8 41.9  MCV 99.5 99.8 98.1  PLT 216 223 175   Cardiac Enzymes:  Recent Labs Lab 10/05/14 1420 10/08/14 0420  CKTOTAL  --  206  TROPONINI <0.30  --  BNP (last 3 results)  Recent Labs  10/05/14 1420  PROBNP >70000.0*   CBG:  Recent Labs Lab 10/05/14 2057 10/07/14 0553 10/08/14 0642 10/08/14 1914 10/09/14 0636  GLUCAP 205* 104* 94 144* 99    No results found for this or any previous visit (from the past 240 hour(s)).   Studies: No results found.  Scheduled Meds: . aspirin EC  325 mg Oral QHS  . calcitRIOL  0.25 mcg Oral Q M,W,F  . cholecalciferol  1,000 Units Oral Daily  . cycloSPORINE modified  50 mg Oral BID  . ferrous sulfate  325 mg Oral TID WC  . FLUoxetine  20 mg Oral Q breakfast  . heparin  5,000 Units Subcutaneous 3 times per day  . levothyroxine  75 mcg Oral QAC breakfast  . magnesium oxide  400 mg Oral  QHS  . metoprolol tartrate  37.5 mg Oral BID  . mycophenolate  250 mg Oral BID  . pantoprazole  20 mg Oral Daily  . pravastatin  10 mg Oral Q breakfast  . senna-docusate  1 tablet Oral Daily  . sodium bicarbonate  650 mg Oral QID  . sodium chloride  3 mL Intravenous Q12H  . tamsulosin  0.4 mg Oral QPC supper  . vitamin B-12  1,000 mcg Oral Q breakfast   Continuous Infusions:   Time spent: 25 minutes  Tationa Stech  [facility] Pager [phone]. If 7PM-7AM, please contact night-coverage at [email or web address], password [facility] 10/09/2014, 11:00 AM  LOS: 4 days

## 2014-10-09 NOTE — Progress Notes (Signed)
Patient ID: Jeffory Snelgrove, male   DOB: 1941-03-09, 73 y.o.   MRN: [id number]   [facility] Progress Note   Assessment/ Plan:   1. ESRD following progression of CKD with intermittent acute renal failure: Started on hemodialysis in order to avert uremic symptoms/electrolyte abnormalities/volume overload. Using his left brachiocephalic fistula without problems-process initiated to try and get into an outpatient dialysis unit. Discussed yet again with the patient that ultrafiltration/dialysis may be a challenging venture with his chronic history of orthostatic blood pressure drops. 2. H/o heart transplant- per cardiology notes, good allograft function. No changes to current antirejection therapy 3. HTN- with supine hypertension with prominent orthostatic drops upon standing-the compromise has been using his sitting (with legs dependent) blood pressures as our yardstick for management.  4. Right knee pain- s/p fall, Xray without evidence of fx but pt unable to bear weight and transfer, plans noted for placement to a skilled nursing facility-possibly [place] and rehabilitation (proximity to where he lives) 5. Anemia of chronic disease- stable 6. Metabolic bone disease: Continued on vit D, calcium and phosphorus levels within acceptable limits  Subjective:   Reports to be feeling fair-no different since dialysis yesterday    Objective:   BP 189/93 mmHg  Pulse 67  Temp(Src) 97.5 F (36.4 C) (Oral)  Resp 14  Ht 6\' 2"  (1.88 m)  Wt 73.3 kg (161 lb 9.6 oz)  BMI 20.74 kg/m2  SpO2 100%  Physical Exam: Gen: Resting comfortably at dialysis CVS: Pulse regular in rate and rhythm, S1 and S2 normal Resp: Clear to auscultation-no rales Abd: Soft, flat, nontender and bowel sounds are normal Ext: Trace lower extremity edema  Labs: BMET  Recent Labs Lab 10/05/14 1420 10/06/14 0700 10/07/14 0600 10/08/14 0420  NA 140 140 141 141  K 4.4 3.9 4.2 4.7  CL 101 99 102 102   CO2 22 22 21 20   GLUCOSE 130* 119* 96 74  BUN 59* 61* 59* 59*  CREATININE 5.41* 5.53* 5.61* 5.89*  CALCIUM 8.3* 8.3* 8.0* 8.1*  PHOS  --  4.6  --   --    CBC  Recent Labs Lab 10/05/14 1420 10/06/14 0700  WBC 4.7 7.1  NEUTROABS 2.8  --   HGB 12.3* 12.5*  HCT 41.0 41.8  MCV 99.5 99.8  PLT 216 223   Medications:    . aspirin EC  325 mg Oral QHS  . calcitRIOL  0.25 mcg Oral Q M,W,F  . cholecalciferol  1,000 Units Oral Daily  . cycloSPORINE modified  50 mg Oral BID  . ferrous sulfate  325 mg Oral TID WC  . FLUoxetine  20 mg Oral Q breakfast  . heparin  5,000 Units Subcutaneous 3 times per day  . levothyroxine  75 mcg Oral QAC breakfast  . magnesium oxide  400 mg Oral QHS  . metoprolol tartrate  37.5 mg Oral BID  . mycophenolate  250 mg Oral BID  . pantoprazole  20 mg Oral Daily  . pravastatin  10 mg Oral Q breakfast  . senna-docusate  1 tablet Oral Daily  . sodium bicarbonate  650 mg Oral QID  . sodium chloride  3 mL Intravenous Q12H  . tamsulosin  0.4 mg Oral QPC supper  . vitamin B-12  1,000 mcg Oral Q breakfast   Elmarie Shiley, MD 10/09/2014, 8:39 AM

## 2014-10-10 LAB — RENAL FUNCTION PANEL
ALBUMIN: 2.3 g/dL — AB (ref 3.5–5.2)
Anion gap: 15 (ref 5–15)
BUN: 30 mg/dL — ABNORMAL HIGH (ref 6–23)
CALCIUM: 8.1 mg/dL — AB (ref 8.4–10.5)
CO2: 25 mEq/L (ref 19–32)
CREATININE: 4.24 mg/dL — AB (ref 0.50–1.35)
Chloride: 100 mEq/L (ref 96–112)
GFR calc Af Amer: 15 mL/min — ABNORMAL LOW (ref >90)
GFR, EST NON AFRICAN AMERICAN: 13 mL/min — AB (ref >90)
Glucose, Bld: 128 mg/dL — ABNORMAL HIGH (ref 70–99)
Phosphorus: 3.7 mg/dL (ref 2.3–4.6)
Potassium: 3.8 mEq/L (ref 3.7–5.3)
Sodium: 140 mEq/L (ref 137–147)

## 2014-10-10 LAB — CBC
HCT: 40.4 % (ref 39.0–52.0)
Hemoglobin: 12 g/dL — ABNORMAL LOW (ref 13.0–17.0)
MCH: 28.6 pg (ref 26.0–34.0)
MCHC: 29.7 g/dL — AB (ref 30.0–36.0)
MCV: 96.4 fL (ref 78.0–100.0)
PLATELETS: 162 10*3/uL (ref 150–400)
RBC: 4.19 MIL/uL — ABNORMAL LOW (ref 4.22–5.81)
RDW: 15.3 % (ref 11.5–15.5)
WBC: 4.8 10*3/uL (ref 4.0–10.5)

## 2014-10-10 LAB — GLUCOSE, CAPILLARY: Glucose-Capillary: 86 mg/dL (ref 70–99)

## 2014-10-10 NOTE — Progress Notes (Signed)
Patient ID: [name] [name], male   DOB: 12/22/1940, 73 y.o.   MRN: [id number]  [facility] Progress Note   Assessment/ Plan:   1. ESRD following progression of CKD with intermittent acute renal failure: Dialysis started 2 days ago to address slowly evolving uremic symptoms and worsening renal function. Using his left brachiocephalic fistula without problems-process initiated to try and get into an outpatient dialysis unit. Discussed yet again with the patient that ultrafiltration/dialysis may be a challenging venture with his chronic history of orthostatic blood pressure drops. Currently getting dialysis (third treatment)-next on Monday (modified TTS) 2. H/o heart transplant- per cardiology notes, good allograft function. No changes to current antirejection therapy 3. HTN- with supine hypertension with prominent orthostatic drops upon standing-the compromise has been using his sitting (with legs dependent) blood pressures as our yardstick for management.  4. Right knee pain- s/p fall, Xray without evidence of fx but pt unable to bear weight and transfer, plans noted for placement to a skilled nursing facility-possibly [place] and rehabilitation (proximity to where he lives) 5. Anemia of chronic disease- stable 6. Metabolic bone disease: Continued on vit D, calcium and phosphorus levels within acceptable limits  Subjective:   Reports to be feeling better today with improvement of his energy/appetite overnight-no acute events overnight    Objective:   BP 196/79 mmHg  Pulse 73  Temp(Src) 97.9 F (36.6 C) (Oral)  Resp 18  Ht 6\' 2"  (1.88 m)  Wt 72.3 kg (159 lb 6.3 oz)  BMI 20.46 kg/m2  SpO2 99%  Intake/Output Summary (Last 24 hours) at 10/10/14 0914 Last data filed at 10/10/14 0449  Gross per 24 hour  Intake    360 ml  Output   1575 ml  Net  -1215 ml   Weight change: -0.8 kg (-1 lb 12.2 oz)  Physical Exam: Gen: Comfortably resting in dialysis CVS: Pulse  regular rate and rhythm, S1 and S2 normal Resp: Clear to auscultation-no rales/rhonchi Abd: Soft, obese, nontender, bowel sounds normal Ext: Trace ankle edema  Imaging: No results found.  Labs: BMET  Recent Labs Lab 10/05/14 1420 10/06/14 0700 10/07/14 0600 10/08/14 0420 10/09/14 0841  NA 140 140 141 141 140  K 4.4 3.9 4.2 4.7 3.8  CL 101 99 102 102 100  CO2 22 22 21 20 24   GLUCOSE 130* 119* 96 74 108*  BUN 59* 61* 59* 59* 43*  CREATININE 5.41* 5.53* 5.61* 5.89* 4.91*  CALCIUM 8.3* 8.3* 8.0* 8.1* 8.1*  PHOS  --  4.6  --   --  4.1   CBC  Recent Labs Lab 10/05/14 1420 10/06/14 0700 10/09/14 0841  WBC 4.7 7.1 4.8  NEUTROABS 2.8  --   --   HGB 12.3* 12.5* 12.7*  HCT 41.0 41.8 41.9  MCV 99.5 99.8 98.1  PLT 216 223 175   Medications:    . aspirin EC  325 mg Oral QHS  . calcitRIOL  0.25 mcg Oral Q M,W,F  . cholecalciferol  1,000 Units Oral Daily  . cycloSPORINE modified  50 mg Oral BID  . ferrous sulfate  325 mg Oral TID WC  . FLUoxetine  20 mg Oral Q breakfast  . heparin  5,000 Units Subcutaneous 3 times per day  . levothyroxine  75 mcg Oral QAC breakfast  . magnesium oxide  400 mg Oral QHS  . metoprolol tartrate  37.5 mg Oral BID  . mycophenolate  250 mg Oral BID  . pantoprazole  20 mg Oral Daily  .  pravastatin  10 mg Oral Q breakfast  . senna-docusate  1 tablet Oral Daily  . sodium bicarbonate  650 mg Oral QID  . sodium chloride  3 mL Intravenous Q12H  . tamsulosin  0.4 mg Oral QPC supper  . vitamin B-12  1,000 mcg Oral Q breakfast   Elmarie Shiley, MD 10/10/2014, 9:14 AM

## 2014-10-10 NOTE — Progress Notes (Signed)
[facility] PROGRESS NOTE  Drakkar Medeiros TGG:[id number] DOB: 1941/01/14 DOA: 10/05/2014 PCP: Lucrezia Starch, MD  Summary 73 year old white male with multiple medical problems presented with worsening weakness after a fall 2 weeks ago. Workup thus far significant only for worsening renal failure. Has a mature left AV fistula and will start dialysis today.  Assessment/Plan: Malignant hypertension: goal supine SBP 170-180 due to recurrent falls/syncope from orthostatic hypotension.  Better on higher metoprolol dose    Weakness generalized: likely multifactorial:  orthostatic hypotension, deconditioning, worsening renal failure.  B12, folate cpk and NH3 ok. To SNF when clipped   Progression of CKD, now ESRD: dialysis and clipping in process . nephrology   Orthostatic hypotension: see above. Was able to ambulate with physical therapy in the hallway yesterday.   Heart transplanted   Loculated pleural effusion: not enough to tap    Hearing impaired: has hearing aides DM 2, monitor CBGs, continue carb mod. Not on meds per med rec. Hypothyroidism: recent TSH ok Right knee pain: xray negative, nontender, able to bear weight. No evidence of fracture or significant effusion or ligamentous laxity. Ace wrap for comfort. No need for ortho consult   Code Status:  full Family Communication:  Wife at bedside 11/20 Disposition Plan:  Skilled nursing facility after clipped  Consultants:  nephrology  Procedures:     Antibiotics:    HPI/Subjective: Eating breakfast, no new c/o  Objective: Filed Vitals:   10/10/14 0449  BP: 196/79  Pulse: 73  Temp: 97.9 F (36.6 C)  Resp: 18    Intake/Output Summary (Last 24 hours) at 10/10/14 0830 Last data filed at 10/10/14 0449  Gross per 24 hour  Intake    360 ml  Output   1575 ml  Net  -1215 ml   Filed Weights   10/08/14 1543 10/09/14 0822 10/09/14 1107  Weight: 72.4 kg (159 lb 9.8 oz) 73.3 kg (161 lb 9.6 oz) 72.3 kg (159 lb  6.3 oz)    Exam:   General:  comfortable  Cardiovascular: RRR without MGR  Respiratory: bronchial BS on right, otherwise CTA without WRR  Abdomen: S, NT, ND  Ext: no CCE. Right knee with full rom, no tenderness, warmth or significant effusion.  Basic Metabolic Panel:  Recent Labs Lab 10/05/14 1420 10/06/14 0700 10/07/14 0600 10/08/14 0420 10/09/14 0841  NA 140 140 141 141 140  K 4.4 3.9 4.2 4.7 3.8  CL 101 99 102 102 100  CO2 22 22 21 20 24   GLUCOSE 130* 119* 96 74 108*  BUN 59* 61* 59* 59* 43*  CREATININE 5.41* 5.53* 5.61* 5.89* 4.91*  CALCIUM 8.3* 8.3* 8.0* 8.1* 8.1*  PHOS  --  4.6  --   --  4.1   Liver Function Tests:  Recent Labs Lab 10/06/14 0700 10/09/14 0841  AST 9  --   ALT 6  --   ALKPHOS 78  --   BILITOT 0.4  --   PROT 5.1*  --   ALBUMIN 2.7* 2.5*   No results for input(s): LIPASE, AMYLASE in the last 168 hours.  Recent Labs Lab 10/08/14 0420  AMMONIA 27   CBC:  Recent Labs Lab 10/05/14 1420 10/06/14 0700 10/09/14 0841  WBC 4.7 7.1 4.8  NEUTROABS 2.8  --   --   HGB 12.3* 12.5* 12.7*  HCT 41.0 41.8 41.9  MCV 99.5 99.8 98.1  PLT 216 223 175   Cardiac Enzymes:  Recent Labs Lab 10/05/14 1420 10/08/14 0420  CKTOTAL  --  [place] <0.30  --    BNP (last 3 results)  Recent Labs  10/05/14 1420  PROBNP >70000.0*   CBG:  Recent Labs Lab 10/08/14 1914 10/09/14 0636 10/09/14 1217 10/09/14 1620 10/10/14 0610  GLUCAP 144* 99 98 146* 86    No results found for this or any previous visit (from the past 240 hour(s)).   Studies: No results found.  Scheduled Meds: . aspirin EC  325 mg Oral QHS  . calcitRIOL  0.25 mcg Oral Q M,W,F  . cholecalciferol  1,000 Units Oral Daily  . cycloSPORINE modified  50 mg Oral BID  . ferrous sulfate  325 mg Oral TID WC  . FLUoxetine  20 mg Oral Q breakfast  . heparin  5,000 Units Subcutaneous 3 times per day  . levothyroxine  75 mcg Oral QAC breakfast  . magnesium oxide  400 mg  Oral QHS  . metoprolol tartrate  37.5 mg Oral BID  . mycophenolate  250 mg Oral BID  . pantoprazole  20 mg Oral Daily  . pravastatin  10 mg Oral Q breakfast  . senna-docusate  1 tablet Oral Daily  . sodium bicarbonate  650 mg Oral QID  . sodium chloride  3 mL Intravenous Q12H  . tamsulosin  0.4 mg Oral QPC supper  . vitamin B-12  1,000 mcg Oral Q breakfast   Continuous Infusions:   Time spent: 15 minutes  Antinio Sanderfer  [facility] Pager [phone]. If 7PM-7AM, please contact night-coverage at [email or web address], password [facility] 10/10/2014, 8:30 AM  LOS: 5 days

## 2014-10-11 LAB — GLUCOSE, CAPILLARY
Glucose-Capillary: 80 mg/dL (ref 70–99)
Glucose-Capillary: 111 mg/dL — ABNORMAL HIGH (ref 70–99)

## 2014-10-11 NOTE — Plan of Care (Signed)
Problem: Phase I Progression Outcomes Goal: OOB as tolerated unless otherwise ordered Outcome: Progressing     

## 2014-10-11 NOTE — Progress Notes (Signed)
Patient ID: [name] [name], male   DOB: 06-14-1941, 73 y.o.   MRN: [id number]  [facility] Progress Note   Assessment/ Plan:   1. ESRD following progression of CKD with intermittent acute renal failure: Dialysis started to address slowly evolving uremic symptoms and worsening renal function-he reports feeling significantly better with regards to appetite/energy following initiation of dialysis. Using his left brachiocephalic fistula without problems-process initiated to try and get into an outpatient dialysis unit. Discussed yet again with the patient that ultrafiltration/dialysis may be a challenging venture with his chronic history of orthostatic blood pressure drops. Next HD tomorrow. 2. H/o heart transplant- per cardiology notes, good allograft function. No changes to current antirejection therapy 3. HTN- with supine hypertension with prominent orthostatic drops upon standing-the compromise has been using his sitting (with legs dependent) blood pressures as our yardstick for management.  4. Right knee pain- s/p fall, Xray without evidence of fx but pt unable to bear weight and transfer, plans noted for placement to a skilled nursing facility-possibly [place] and rehabilitation (proximity to where he lives) 5. Anemia of chronic disease- stable 6. Metabolic bone disease: Continued on vit D, calcium and phosphorus levels within acceptable limits  Subjective:   Reports to be feeling better, tolerating dialysis well and appetite improved    Objective:   BP 187/82 mmHg  Pulse 65  Temp(Src) 98.5 F (36.9 C) (Oral)  Resp 18  Ht 6\' 2"  (1.88 m)  Wt 69.9 kg (154 lb 1.6 oz)  BMI 19.78 kg/m2  SpO2 98%  Physical Exam: Gen: Comfortably resting in bed, wife by bedside CVS: Pulse regular in rate and rhythm, S1 and S2 normal Resp: Clear to auscultation, no rales/rhonchi Abd: Soft, flat, nontender Ext: No lower extremity edema  Labs: BMET  Recent Labs Lab  10/05/14 1420 10/06/14 0700 10/07/14 0600 10/08/14 0420 10/09/14 0841 10/10/14 0912  NA 140 140 141 141 140 140  K 4.4 3.9 4.2 4.7 3.8 3.8  CL 101 99 102 102 100 100  CO2 22 22 21 20 24 25   GLUCOSE 130* 119* 96 74 108* 128*  BUN 59* 61* 59* 59* 43* 30*  CREATININE 5.41* 5.53* 5.61* 5.89* 4.91* 4.24*  CALCIUM 8.3* 8.3* 8.0* 8.1* 8.1* 8.1*  PHOS  --  4.6  --   --  4.1 3.7   CBC  Recent Labs Lab 10/05/14 1420 10/06/14 0700 10/09/14 0841 10/10/14 0912  WBC 4.7 7.1 4.8 4.8  NEUTROABS 2.8  --   --   --   HGB 12.3* 12.5* 12.7* 12.0*  HCT 41.0 41.8 41.9 40.4  MCV 99.5 99.8 98.1 96.4  PLT 216 223 175 162   Medications:    . aspirin EC  325 mg Oral QHS  . calcitRIOL  0.25 mcg Oral Q M,W,F  . cholecalciferol  1,000 Units Oral Daily  . cycloSPORINE modified  50 mg Oral BID  . ferrous sulfate  325 mg Oral TID WC  . FLUoxetine  20 mg Oral Q breakfast  . heparin  5,000 Units Subcutaneous 3 times per day  . levothyroxine  75 mcg Oral QAC breakfast  . magnesium oxide  400 mg Oral QHS  . metoprolol tartrate  37.5 mg Oral BID  . mycophenolate  250 mg Oral BID  . pantoprazole  20 mg Oral Daily  . pravastatin  10 mg Oral Q breakfast  . senna-docusate  1 tablet Oral Daily  . sodium bicarbonate  650 mg Oral QID  . sodium chloride  3 mL Intravenous Q12H  . tamsulosin  0.4 mg Oral QPC supper  . vitamin B-12  1,000 mcg Oral Q breakfast   Elmarie Shiley, MD 10/11/2014, 9:50 AM

## 2014-10-11 NOTE — Progress Notes (Signed)
[facility] PROGRESS NOTE  [name] [name] TXH:[id number] DOB: Oct 27, 1941 DOA: 10/05/2014 PCP: Lucrezia Starch, MD  Summary 73 year old white male with multiple medical problems presented with worsening weakness after a fall 2 weeks ago. Workup thus far significant only for worsening renal failure. Has a mature left AV fistula and will start dialysis today.  Assessment/Plan: Malignant hypertension: goal supine SBP 170-180 due to recurrent falls/syncope from orthostatic hypotension.  Better on higher metoprolol dose    Weakness generalized: likely multifactorial:  orthostatic hypotension, deconditioning, worsening renal failure.  B12, folate cpk and NH3 ok. To SNF when clipped   Progression of CKD, now ESRD: dialysis and clipping in process . nephrology   Orthostatic hypotension: see above. Was able to ambulate with physical therapy in the hallway yesterday.   Heart transplanted   Loculated pleural effusion: not enough to tap    Hearing impaired: has hearing aides DM 2, monitor CBGs, continue carb mod. Not on meds per med rec. Hypothyroidism: recent TSH ok Right knee pain: xray negative, nontender, able to bear weight. No evidence of fracture or significant effusion or ligamentous laxity. Ace wrap for comfort. No need for ortho consult   Code Status:  full Family Communication:  Wife at bedside 11/20 Disposition Plan:  Skilled nursing facility after clipped  Consultants:  nephrology  Procedures:     Antibiotics:    HPI/Subjective: "I'm doing ok"  Objective: Filed Vitals:   10/11/14 0446  BP: 195/71  Pulse: 66  Temp: 97.5 F (36.4 C)  Resp: 17    Intake/Output Summary (Last 24 hours) at 10/11/14 0901 Last data filed at 10/10/14 2110  Gross per 24 hour  Intake    240 ml  Output   1150 ml  Net   -910 ml   Filed Weights   10/09/14 1107 10/10/14 0906 10/10/14 1208  Weight: 72.3 kg (159 lb 6.3 oz) 71 kg (156 lb 8.4 oz) 69.9 kg (154 lb 1.6 oz)     Exam:   General:  comfortable  Cardiovascular: RRR without MGR  Respiratory: bronchial BS on right, otherwise CTA without WRR  Abdomen: S, NT, ND  Ext: no CCE. Right knee with full rom, no tenderness, warmth or significant effusion.  Basic Metabolic Panel:  Recent Labs Lab 10/06/14 0700 10/07/14 0600 10/08/14 0420 10/09/14 0841 10/10/14 0912  NA 140 141 141 140 140  K 3.9 4.2 4.7 3.8 3.8  CL 99 102 102 100 100  CO2 22 21 20 24 25   GLUCOSE 119* 96 74 108* 128*  BUN 61* 59* 59* 43* 30*  CREATININE 5.53* 5.61* 5.89* 4.91* 4.24*  CALCIUM 8.3* 8.0* 8.1* 8.1* 8.1*  PHOS 4.6  --   --  4.1 3.7   Liver Function Tests:  Recent Labs Lab 10/06/14 0700 10/09/14 0841 10/10/14 0912  AST 9  --   --   ALT 6  --   --   ALKPHOS 78  --   --   BILITOT 0.4  --   --   PROT 5.1*  --   --   ALBUMIN 2.7* 2.5* 2.3*   No results for input(s): LIPASE, AMYLASE in the last 168 hours.  Recent Labs Lab 10/08/14 0420  AMMONIA 27   CBC:  Recent Labs Lab 10/05/14 1420 10/06/14 0700 10/09/14 0841 10/10/14 0912  WBC 4.7 7.1 4.8 4.8  NEUTROABS 2.8  --   --   --   HGB 12.3* 12.5* 12.7* 12.0*  HCT 41.0 41.8 41.9 40.4  MCV 99.5 99.8  98.1 96.4  PLT 216 223 175 162   Cardiac Enzymes:  Recent Labs Lab 10/05/14 1420 10/08/14 0420  CKTOTAL  --  206  TROPONINI <0.30  --    BNP (last 3 results)  Recent Labs  10/05/14 1420  PROBNP >70000.0*   CBG:  Recent Labs Lab 10/09/14 0636 10/09/14 1217 10/09/14 1620 10/10/14 0610 10/11/14 0656  GLUCAP 99 98 146* 86 80    No results found for this or any previous visit (from the past 240 hour(s)).   Studies: No results found.  Scheduled Meds: . aspirin EC  325 mg Oral QHS  . calcitRIOL  0.25 mcg Oral Q M,W,F  . cholecalciferol  1,000 Units Oral Daily  . cycloSPORINE modified  50 mg Oral BID  . ferrous sulfate  325 mg Oral TID WC  . FLUoxetine  20 mg Oral Q breakfast  . heparin  5,000 Units Subcutaneous 3 times per  day  . levothyroxine  75 mcg Oral QAC breakfast  . magnesium oxide  400 mg Oral QHS  . metoprolol tartrate  37.5 mg Oral BID  . mycophenolate  250 mg Oral BID  . pantoprazole  20 mg Oral Daily  . pravastatin  10 mg Oral Q breakfast  . senna-docusate  1 tablet Oral Daily  . sodium bicarbonate  650 mg Oral QID  . sodium chloride  3 mL Intravenous Q12H  . tamsulosin  0.4 mg Oral QPC supper  . vitamin B-12  1,000 mcg Oral Q breakfast   Continuous Infusions:   Time spent: 15 minutes  [name] [name]  [facility] Pager [phone]. If 7PM-7AM, please contact night-coverage at [email or web address], password [facility] 10/11/2014, 9:01 AM  LOS: 6 days

## 2014-10-11 NOTE — Plan of Care (Signed)
Problem: Phase II Progression Outcomes Goal: Vital signs remain stable Outcome: Completed/Met Date Met:  10/11/14

## 2014-10-12 DIAGNOSIS — Z992 Dependence on renal dialysis: Secondary | ICD-10-CM

## 2014-10-12 DIAGNOSIS — N186 End stage renal disease: Secondary | ICD-10-CM

## 2014-10-12 LAB — RENAL FUNCTION PANEL
Albumin: 2.3 g/dL — ABNORMAL LOW (ref 3.5–5.2)
Anion gap: 11 (ref 5–15)
BUN: 19 mg/dL (ref 6–23)
CHLORIDE: 101 meq/L (ref 96–112)
CO2: 27 meq/L (ref 19–32)
Calcium: 7.8 mg/dL — ABNORMAL LOW (ref 8.4–10.5)
Creatinine, Ser: 3.52 mg/dL — ABNORMAL HIGH (ref 0.50–1.35)
GFR, EST AFRICAN AMERICAN: 18 mL/min — AB (ref >90)
GFR, EST NON AFRICAN AMERICAN: 16 mL/min — AB (ref >90)
Glucose, Bld: 105 mg/dL — ABNORMAL HIGH (ref 70–99)
Phosphorus: 2.9 mg/dL (ref 2.3–4.6)
Potassium: 3.6 mEq/L — ABNORMAL LOW (ref 3.7–5.3)
SODIUM: 139 meq/L (ref 137–147)

## 2014-10-12 LAB — GLUCOSE, CAPILLARY: Glucose-Capillary: 78 mg/dL (ref 70–99)

## 2014-10-12 LAB — CBC
HCT: 39.9 % (ref 39.0–52.0)
Hemoglobin: 11.9 g/dL — ABNORMAL LOW (ref 13.0–17.0)
MCH: 28.9 pg (ref 26.0–34.0)
MCHC: 29.8 g/dL — ABNORMAL LOW (ref 30.0–36.0)
MCV: 96.8 fL (ref 78.0–100.0)
Platelets: 143 10*3/uL — ABNORMAL LOW (ref 150–400)
RBC: 4.12 MIL/uL — AB (ref 4.22–5.81)
RDW: 15.1 % (ref 11.5–15.5)
WBC: 3 10*3/uL — AB (ref 4.0–10.5)

## 2014-10-12 LAB — PARATHYROID HORMONE, INTACT (NO CA): PTH: 56 pg/mL (ref 14–64)

## 2014-10-12 NOTE — Procedures (Signed)
I have seen and examined this patient and agree with the plan of care  Patient ween on dialysis  CLIP  Possible [facility] 10/12/2014, 11:29 AM

## 2014-10-12 NOTE — Progress Notes (Signed)
[facility] PROGRESS NOTE  [name] [name] ZJI:[id number] DOB: [birth year] DOA: 10/05/2014 PCP: [name] Starch, MD  Summary 73 year old white male with multiple medical problems presented with worsening weakness after a fall 2 weeks ago. Workup thus far significant only for worsening renal failure. Has a mature left AV fistula and will start dialysis this admission.  Needs to be Clipped and have SNF placement  Assessment/Plan: Malignant hypertension: goal supine SBP 170-180 due to recurrent falls/syncope from orthostatic hypotension.  Better on higher metoprolol dose    Weakness generalized: likely multifactorial:  orthostatic hypotension, deconditioning, worsening renal failure.  B12, folate cpk and NH3 ok. To SNF when clipped   Progression of CKD, now ESRD: dialysis and clipping in process . nephrology   Orthostatic hypotension: see above. Was able to ambulate with physical therapy in the hallway   Heart transplanted   Loculated pleural effusion: not enough to tap    Hearing impaired: has hearing aides DM 2, monitor CBGs, continue carb mod. Not on meds per med rec. Hypothyroidism: recent TSH ok Right knee pain: xray negative, nontender, able to bear weight. No evidence of fracture or significant effusion or ligamentous laxity. Ace wrap for comfort. No need for ortho consult   Code Status:  full Family Communication:  Wife at bedside 11/20 Disposition Plan:  Skilled nursing facility after clipped  Consultants:  nephrology  Procedures:     Antibiotics:    HPI/Subjective: No overnight events No SOB, no CP  Objective: Filed Vitals:   10/12/14 0500  BP: 192/46  Pulse: 67  Temp: 98 F (36.7 C)  Resp: 18    Intake/Output Summary (Last 24 hours) at 10/12/14 0756 Last data filed at 10/11/14 2154  Gross per 24 hour  Intake      3 ml  Output      0 ml  Net      3 ml   Filed Weights   10/10/14 0906 10/10/14 1208 10/11/14 2100  Weight: 71 kg (156 lb 8.4  oz) 69.9 kg (154 lb 1.6 oz) 70.1 kg (154 lb 8.7 oz)    Exam:   General:  comfortable  Cardiovascular: RRR without MGR  Respiratory: CTA, no wheezing  Abdomen: S, NT, ND  Ext: no CCE.  Basic Metabolic Panel:  Recent Labs Lab 10/06/14 0700 10/07/14 0600 10/08/14 0420 10/09/14 0841 10/10/14 0912  NA 140 141 141 140 140  K 3.9 4.2 4.7 3.8 3.8  CL 99 102 102 100 100  CO2 22 21 20 24 25   GLUCOSE 119* 96 74 108* 128*  BUN 61* 59* 59* 43* 30*  CREATININE 5.53* 5.61* 5.89* 4.91* 4.24*  CALCIUM 8.3* 8.0* 8.1* 8.1* 8.1*  PHOS 4.6  --   --  4.1 3.7   Liver Function Tests:  Recent Labs Lab 10/06/14 0700 10/09/14 0841 10/10/14 0912  AST 9  --   --   ALT 6  --   --   ALKPHOS 78  --   --   BILITOT 0.4  --   --   PROT 5.1*  --   --   ALBUMIN 2.7* 2.5* 2.3*   No results for input(s): LIPASE, AMYLASE in the last 168 hours.  Recent Labs Lab 10/08/14 0420  AMMONIA 27   CBC:  Recent Labs Lab 10/05/14 1420 10/06/14 0700 10/09/14 0841 10/10/14 0912  WBC 4.7 7.1 4.8 4.8  NEUTROABS 2.8  --   --   --   HGB 12.3* 12.5* 12.7* 12.0*  HCT 41.0 41.8 41.9  40.4  MCV 99.5 99.8 98.1 96.4  PLT 216 223 175 162   Cardiac Enzymes:  Recent Labs Lab 10/05/14 1420 10/08/14 0420  CKTOTAL  --  206  TROPONINI <0.30  --    BNP (last 3 results)  Recent Labs  10/05/14 1420  PROBNP >70000.0*   CBG:  Recent Labs Lab 10/09/14 1620 10/10/14 0610 10/11/14 0656 10/11/14 1715 10/12/14 0719  GLUCAP 146* 86 80 111* 78    No results found for this or any previous visit (from the past 240 hour(s)).   Studies: No results found.  Scheduled Meds: . aspirin EC  325 mg Oral QHS  . calcitRIOL  0.25 mcg Oral Q M,W,F  . cholecalciferol  1,000 Units Oral Daily  . cycloSPORINE modified  50 mg Oral BID  . ferrous sulfate  325 mg Oral TID WC  . FLUoxetine  20 mg Oral Q breakfast  . heparin  5,000 Units Subcutaneous 3 times per day  . levothyroxine  75 mcg Oral QAC breakfast   . magnesium oxide  400 mg Oral QHS  . metoprolol tartrate  37.5 mg Oral BID  . mycophenolate  250 mg Oral BID  . pantoprazole  20 mg Oral Daily  . pravastatin  10 mg Oral Q breakfast  . senna-docusate  1 tablet Oral Daily  . sodium bicarbonate  650 mg Oral QID  . sodium chloride  3 mL Intravenous Q12H  . tamsulosin  0.4 mg Oral QPC supper  . vitamin B-12  1,000 mcg Oral Q breakfast   Continuous Infusions:   Time spent: 15 minutes  [name]  [facility] Pager [phone]. If 7PM-7AM, please contact night-coverage at [email or web address], password [facility] 10/12/2014, 7:56 AM  LOS: 7 days

## 2014-10-12 NOTE — Progress Notes (Signed)
PT Cancellation Note  Patient Details Name: [name] [name] MRN: [id number] DOB: 1941-06-06   Cancelled Treatment:    Reason Eval/Treat Not Completed: Patient at procedure or test/unavailable.  Pt in HD.  PT will check back tomorrow as time allows.    Thanks,    Barbarann Ehlers. Guin, [place], DPT [phone]   10/12/2014, 4:17 PM

## 2014-10-12 NOTE — Progress Notes (Signed)
OT Cancellation Note  Patient Details Name: [name] [name] MRN: [id number] DOB: [birth year]   Cancelled Treatment:    Reason Eval/Treat Not Completed: Patient at procedure or test/ unavailable (HD). Will continue to follow.  Malka So 10/12/2014, 11:42 AM

## 2014-10-13 LAB — GLUCOSE, CAPILLARY
GLUCOSE-CAPILLARY: 102 mg/dL — AB (ref 70–99)
Glucose-Capillary: 170 mg/dL — ABNORMAL HIGH (ref 70–99)

## 2014-10-13 NOTE — Plan of Care (Signed)
Problem: Phase II Progression Outcomes Goal: Discharge plan established Outcome: Completed/Met Date Met:  10/13/14

## 2014-10-13 NOTE — Progress Notes (Signed)
[facility] PROGRESS NOTE  Lundon Verdejo POE:[id number] DOB: 1941/05/13 DOA: 10/05/2014 PCP: Lucrezia Starch, MD  Summary 73 year old white male with multiple medical problems presented with worsening weakness after a fall 2 weeks ago. Workup thus far significant only for worsening renal failure. Has a mature left AV fistula and will start dialysis this admission.  Needs to be Clipped and have SNF placement  Assessment/Plan: Malignant hypertension: goal supine SBP 170-180 due to recurrent falls/syncope from orthostatic hypotension.  Better on higher metoprolol dose    Weakness generalized: likely multifactorial:  orthostatic hypotension, deconditioning, worsening renal failure.  B12, folate cpk and NH3 ok. To SNF when clipped   Progression of CKD, now ESRD: dialysis and clipping in process . nephrology   Orthostatic hypotension: see above. Was able to ambulate with physical therapy in the hallway   Heart transplanted   Loculated pleural effusion: not enough to tap    Hearing impaired: has hearing aides DM 2, monitor CBGs, continue carb mod. Not on meds per med rec. Hypothyroidism: recent TSH ok Right knee pain: xray negative, nontender, able to bear weight. No evidence of fracture or significant effusion or ligamentous laxity. Ace wrap for comfort. No need for ortho consult   Code Status:  full Family Communication:  Wife at bedside 11/20 Disposition Plan:  Skilled nursing facility after clipped  Consultants:  nephrology  Procedures:     Antibiotics:    HPI/Subjective: Doing well this AM Had HD yest and tolerated with no adverse events  Objective: Filed Vitals:   10/13/14 0728  BP: 171/68  Pulse: 67  Temp:   Resp:     Intake/Output Summary (Last 24 hours) at 10/13/14 0843 Last data filed at 10/13/14 0600  Gross per 24 hour  Intake    240 ml  Output   1000 ml  Net   -760 ml   Filed Weights   10/10/14 1208 10/11/14 2100 10/12/14 2137  Weight:  69.9 kg (154 lb 1.6 oz) 70.1 kg (154 lb 8.7 oz) 68.9 kg (151 lb 14.4 oz)    Exam:   General:  comfortable  Cardiovascular: RRR without MGR  Respiratory: CTA, no wheezing  Abdomen: S, NT, ND  Ext: no CCE.  Basic Metabolic Panel:  Recent Labs Lab 10/07/14 0600 10/08/14 0420 10/09/14 0841 10/10/14 0912 10/12/14 1330  NA 141 141 140 140 139  K 4.2 4.7 3.8 3.8 3.6*  CL 102 102 100 100 101  CO2 21 20 24 25 27   GLUCOSE 96 74 108* 128* 105*  BUN 59* 59* 43* 30* 19  CREATININE 5.61* 5.89* 4.91* 4.24* 3.52*  CALCIUM 8.0* 8.1* 8.1* 8.1* 7.8*  PHOS  --   --  4.1 3.7 2.9   Liver Function Tests:  Recent Labs Lab 10/09/14 0841 10/10/14 0912 10/12/14 1330  ALBUMIN 2.5* 2.3* 2.3*   No results for input(s): LIPASE, AMYLASE in the last 168 hours.  Recent Labs Lab 10/08/14 0420  AMMONIA 27   CBC:  Recent Labs Lab 10/09/14 0841 10/10/14 0912 10/12/14 1330  WBC 4.8 4.8 3.0*  HGB 12.7* 12.0* 11.9*  HCT 41.9 40.4 39.9  MCV 98.1 96.4 96.8  PLT 175 162 143*   Cardiac Enzymes:  Recent Labs Lab 10/08/14 0420  CKTOTAL 206   BNP (last 3 results)  Recent Labs  10/05/14 1420  PROBNP >70000.0*   CBG:  Recent Labs Lab 10/10/14 0610 10/11/14 0656 10/11/14 1715 10/12/14 0719 10/13/14 0726  GLUCAP 86 80 111* 78 102*    No  results found for this or any previous visit (from the past 240 hour(s)).   Studies: No results found.  Scheduled Meds: . aspirin EC  325 mg Oral QHS  . calcitRIOL  0.25 mcg Oral Q M,W,F  . cholecalciferol  1,000 Units Oral Daily  . cycloSPORINE modified  50 mg Oral BID  . ferrous sulfate  325 mg Oral TID WC  . FLUoxetine  20 mg Oral Q breakfast  . heparin  5,000 Units Subcutaneous 3 times per day  . levothyroxine  75 mcg Oral QAC breakfast  . magnesium oxide  400 mg Oral QHS  . metoprolol tartrate  37.5 mg Oral BID  . mycophenolate  250 mg Oral BID  . pantoprazole  20 mg Oral Daily  . pravastatin  10 mg Oral Q breakfast  .  senna-docusate  1 tablet Oral Daily  . sodium bicarbonate  650 mg Oral QID  . sodium chloride  3 mL Intravenous Q12H  . tamsulosin  0.4 mg Oral QPC supper  . vitamin B-12  1,000 mcg Oral Q breakfast   Continuous Infusions:   Time spent: 15 minutes  VANN, JESSICA  [facility] Pager [phone]. If 7PM-7AM, please contact night-coverage at [email or web address], password [facility] 10/13/2014, 8:43 AM  LOS: 8 days

## 2014-10-13 NOTE — Plan of Care (Signed)
Problem: Phase III Progression Outcomes Goal: Pain controlled on oral analgesia Outcome: Completed/Met Date Met:  10/13/14 Goal: Voiding independently Outcome: Completed/Met Date Met:  10/13/14 Goal: Foley discontinued Outcome: Not Applicable Date Met:  25/48/62

## 2014-10-13 NOTE — Progress Notes (Signed)
10/13/2014 9:15 AM Hemodialysis Outpatient Note; this patient has been accepted at the [place] center on a TTS 1st shift schedule. The center can begin treatment on Saturday November 28th and patient would need to be there at 6:00 AM. Thank you. Gordy Savers

## 2014-10-13 NOTE — Progress Notes (Signed)
Physical Therapy Treatment Patient Details Name: [name] [name] MRN: [id number] DOB: 1941-06-27 Today's Date: 10/13/2014    History of Present Illness Pt is a 73 y/o male with a PMH of CDK followed by Nephrology, HTN, prior heart transplant done at [facility], CAD who presents to the ED with weakness and multiple falls. The patient was noted to have markedly elevated bp with sbp in the 200's and worsening renal function. Of note, the patient has a hx of sensitivity to aggressive bp meds and has been noted to become hypotensive with treatment.     PT Comments    Pt progressing towards physical therapy goals. Pt demonstrated great improvement in overall gait pattern, with a controlled gait speed and no unsteadiness/LOB. Pt was able to also improve gait distance to 60'. No dizziness noted throughout session.  Orthostatic Vitals: Supine: 176/68, Seated: 158/73, Standing (0 minutes): 116/66, Standing (3 minutes): 145/78  Follow Up Recommendations  SNF;Supervision/Assistance - 24 hour     Equipment Recommendations  None recommended by PT    Recommendations for Other Services       Precautions / Restrictions Precautions Precautions: Fall Restrictions Weight Bearing Restrictions: No    Mobility  Bed Mobility Overal bed mobility: Needs Assistance Bed Mobility: Supine to Sit;Sit to Supine     Supine to sit: Min guard     General bed mobility comments: Pt was able to transition to EOB without physical assistance. Close guarding of trunk for safety, however he was able to maintain self-supported sitting with bed rail for support.   Transfers Overall transfer level: Needs assistance Equipment used: Rolling walker (2 wheeled) Transfers: Sit to/from Stand Sit to Stand: Min guard         General transfer comment: Pt able to power-up to full standing position without assistance. Pt was cued for improved posture once achieving full stand.   Ambulation/Gait Ambulation/Gait  assistance: Min guard Ambulation Distance (Feet): 60 Feet Assistive device: Rolling walker (2 wheeled) Gait Pattern/deviations: Step-through pattern;Decreased stride length;Trunk flexed Gait velocity: Decreased Gait velocity interpretation: Below normal speed for age/gender General Gait Details: VC's for improved posture and to maintain a controlled speed.    Stairs            Wheelchair Mobility    Modified Rankin (Stroke Patients Only)       Balance Overall balance assessment: Needs assistance Sitting-balance support: Feet supported;No upper extremity supported Sitting balance-Leahy Scale: Fair     Standing balance support: Bilateral upper extremity supported;During functional activity Standing balance-Leahy Scale: Poor                      Cognition Arousal/Alertness: Awake/alert Behavior During Therapy: Flat affect Overall Cognitive Status: History of cognitive impairments - at baseline                      Exercises      General Comments        Pertinent Vitals/Pain Pain Assessment: No/denies pain    Home Living                      Prior Function            PT Goals (current goals can now be found in the care plan section) Acute Rehab PT Goals Patient Stated Goal: not stated PT Goal Formulation: With patient Time For Goal Achievement: 10/20/14 Potential to Achieve Goals: Good Progress towards PT goals: Progressing toward goals  Frequency  Min 2X/week    PT Plan Current plan remains appropriate    Co-evaluation             End of Session Equipment Utilized During Treatment: Gait belt Activity Tolerance: Patient tolerated treatment well Patient left: in chair;with call bell/phone within reach;with chair alarm set;with family/visitor present     Time: 1547-1610 PT Time Calculation (min) (ACUTE ONLY): 23 min  Charges:  $Gait Training: 8-22 mins $Therapeutic Activity: 8-22 mins                    G  Codes:      Rolinda Roan 10/30/2014, 5:01 PM  Rolinda Roan, PT, DPT Acute Rehabilitation Services Pager: [phone]

## 2014-10-13 NOTE — Progress Notes (Signed)
[facility] ROUNDING NOTE   Subjective:   Interval History: no complaints awaiting   Objective:  Vital signs in last 24 hours:  Temp:  [97.5 F (36.4 C)-98.2 F (36.8 C)] 97.9 F (36.6 C) (11/24 0500) Pulse Rate:  [66-79] 67 (11/24 0728) Resp:  [10-19] 18 (11/24 0500) BP: (160-212)/(68-114) 171/68 mmHg (11/24 0728) SpO2:  [97 %-100 %] 100 % (11/24 0500) Weight:  [68.9 kg (151 lb 14.4 oz)] 68.9 kg (151 lb 14.4 oz) (11/23 2137)  Weight change: -1.2 kg (-2 lb 10.3 oz) Filed Weights   10/10/14 1208 10/11/14 2100 10/12/14 2137  Weight: 69.9 kg (154 lb 1.6 oz) 70.1 kg (154 lb 8.7 oz) 68.9 kg (151 lb 14.4 oz)    Intake/Output: I/O last 3 completed shifts: In: 243 [P.O.:240; I.V.:3] Out: 1000 [Other:1000]   Intake/Output this shift:     CVS- RRR RS- CTA ABD- BS present soft non-distended EXT- no edema AVF left arm   Basic Metabolic Panel:  Recent Labs Lab 10/07/14 0600 10/08/14 0420 10/09/14 0841 10/10/14 0912 10/12/14 1330  NA 141 141 140 140 139  K 4.2 4.7 3.8 3.8 3.6*  CL 102 102 100 100 101  CO2 21 20 24 25 27   GLUCOSE 96 74 108* 128* 105*  BUN 59* 59* 43* 30* 19  CREATININE 5.61* 5.89* 4.91* 4.24* 3.52*  CALCIUM 8.0* 8.1* 8.1* 8.1* 7.8*  PHOS  --   --  4.1 3.7 2.9    Liver Function Tests:  Recent Labs Lab 10/09/14 0841 10/10/14 0912 10/12/14 1330  ALBUMIN 2.5* 2.3* 2.3*   No results for input(s): LIPASE, AMYLASE in the last 168 hours.  Recent Labs Lab 10/08/14 0420  AMMONIA 27    CBC:  Recent Labs Lab 10/09/14 0841 10/10/14 0912 10/12/14 1330  WBC 4.8 4.8 3.0*  HGB 12.7* 12.0* 11.9*  HCT 41.9 40.4 39.9  MCV 98.1 96.4 96.8  PLT 175 162 143*    Cardiac Enzymes:  Recent Labs Lab 10/08/14 0420  CKTOTAL 206    BNP: Invalid input(s): POCBNP  CBG:  Recent Labs Lab 10/09/14 1620 10/10/14 0610 10/11/14 0656 10/11/14 1715 10/12/14 0719  GLUCAP 146* 86 80 111* 62    Microbiology: Results for orders  placed or performed during the hospital encounter of 08/31/14  Urine culture     Status: None   Collection Time: 08/31/14  1:32 PM  Result Value Ref Range Status   Specimen Description URINE, CLEAN CATCH  Final   Special Requests NONE  Final   Culture  Setup Time   Final    08/31/2014 20:42 Performed at [place] Performed at [facility]  Final   Culture NO GROWTH Performed at [facility]  Final   Report Status 09/01/2014 FINAL  Final    Coagulation Studies: No results for input(s): LABPROT, INR in the last 72 hours.  Urinalysis: No results for input(s): COLORURINE, LABSPEC, PHURINE, GLUCOSEU, HGBUR, BILIRUBINUR, KETONESUR, PROTEINUR, UROBILINOGEN, NITRITE, LEUKOCYTESUR in the last 72 hours.  Invalid input(s): APPERANCEUR    Imaging: No results found.   Medications:     . aspirin EC  325 mg Oral QHS  . calcitRIOL  0.25 mcg Oral Q M,W,F  . cholecalciferol  1,000 Units Oral Daily  . cycloSPORINE modified  50 mg Oral BID  . ferrous sulfate  325 mg Oral TID WC  . FLUoxetine  20 mg Oral Q breakfast  . heparin  5,000 Units Subcutaneous  3 times per day  . levothyroxine  75 mcg Oral QAC breakfast  . magnesium oxide  400 mg Oral QHS  . metoprolol tartrate  37.5 mg Oral BID  . mycophenolate  250 mg Oral BID  . pantoprazole  20 mg Oral Daily  . pravastatin  10 mg Oral Q breakfast  . senna-docusate  1 tablet Oral Daily  . sodium bicarbonate  650 mg Oral QID  . sodium chloride  3 mL Intravenous Q12H  . tamsulosin  0.4 mg Oral QPC supper  . vitamin B-12  1,000 mcg Oral Q breakfast   acetaminophen **OR** acetaminophen, diphenoxylate-atropine, famotidine, fluticasone, hydrALAZINE, morphine injection, ondansetron **OR** ondansetron (ZOFRAN) IV, promethazine  Assessment/ Plan:  1. ESRD following progression of CKD with intermittent acute renal failure: Dialysis started to address slowly evolving uremic symptoms and worsening  renal function-he reports feeling significantly better with regards to appetite/energy following initiation of dialysis. Using his left brachiocephalic fistula without problems-process initiated to try and get into an outpatient dialysis unit.  Next HD tomorrow. 2. H/o heart transplant- per cardiology notes, good allograft function. No changes to current antirejection therapy 3. HTN- with supine hypertension with prominent orthostatic drops upon standing-the compromise has been using his sitting (with legs dependent) blood pressures as our yardstick for management.  4. Right knee pain- s/p fall, Xray without evidence of fx but pt unable to bear weight and transfer, plans noted for placement to a skilled nursing facility-possibly [facility] and rehabilitation (proximity to where he lives) 5. Anemia of chronic disease- stable 6. Metabolic bone disease: Continued on vit D, calcium and phosphorus levels within acceptable limits Plan dialysis in AM   LOS: 8 [name] [name] W @TODAY @7 :50 AM

## 2014-10-13 NOTE — Clinical Social Work Note (Signed)
CSW talked with patient, wife [name] and son [name] regarding facility choice for patient. Their choice is [facility] skilled facility and call made to them, however CSW informed that they will not be able to transport patient to dialysis as he is 1st shift and has to be there at 6 am for his 1st treatment and 6:30 thereafter. CSW advised patient and [place] was given. Call made to this facility and CSW advised that they will not be able to meet his complex medical needs. CSW followed up with family and Mrs. Pinson still wants [facility] and requested that CSW inform them that she will transport him to each dialysis session. Call made to [facility] and message left for admissions director. Patient and family aware that patient's anticipated d/c date is Wednesday, 11/25. CSW will follow-up with Rober Minion on Wednesday.  Adrie Picking Givens, MSW, LCSW [phone]

## 2014-10-14 LAB — CBC
HEMATOCRIT: 38.1 % — AB (ref 39.0–52.0)
Hemoglobin: 11.3 g/dL — ABNORMAL LOW (ref 13.0–17.0)
MCH: 28.6 pg (ref 26.0–34.0)
MCHC: 29.7 g/dL — AB (ref 30.0–36.0)
MCV: 96.5 fL (ref 78.0–100.0)
Platelets: 131 10*3/uL — ABNORMAL LOW (ref 150–400)
RBC: 3.95 MIL/uL — AB (ref 4.22–5.81)
RDW: 14.8 % (ref 11.5–15.5)
WBC: 4.1 10*3/uL (ref 4.0–10.5)

## 2014-10-14 LAB — RENAL FUNCTION PANEL
Albumin: 2.3 g/dL — ABNORMAL LOW (ref 3.5–5.2)
Anion gap: 10 (ref 5–15)
BUN: 24 mg/dL — AB (ref 6–23)
CO2: 27 meq/L (ref 19–32)
Calcium: 7.8 mg/dL — ABNORMAL LOW (ref 8.4–10.5)
Chloride: 100 mEq/L (ref 96–112)
Creatinine, Ser: 3.7 mg/dL — ABNORMAL HIGH (ref 0.50–1.35)
GFR calc non Af Amer: 15 mL/min — ABNORMAL LOW (ref >90)
GFR, EST AFRICAN AMERICAN: 17 mL/min — AB (ref >90)
Glucose, Bld: 95 mg/dL (ref 70–99)
PHOSPHORUS: 3.1 mg/dL (ref 2.3–4.6)
POTASSIUM: 4.3 meq/L (ref 3.7–5.3)
Sodium: 137 mEq/L (ref 137–147)

## 2014-10-14 LAB — GLUCOSE, CAPILLARY: GLUCOSE-CAPILLARY: 92 mg/dL (ref 70–99)

## 2014-10-14 MED ORDER — PENTAFLUOROPROP-TETRAFLUOROETH EX AERO
1.0000 | INHALATION_SPRAY | CUTANEOUS | Status: DC | PRN
Start: 2014-10-14 — End: 2014-10-14

## 2014-10-14 MED ORDER — ACETAMINOPHEN 325 MG PO TABS: 650.0000 mg | ORAL_TABLET | Freq: Four times a day (QID) | ORAL | Status: AC | PRN

## 2014-10-14 MED ORDER — LIDOCAINE-PRILOCAINE 2.5-2.5 % EX CREA: 1.0000 "application " | TOPICAL_CREAM | CUTANEOUS | Status: DC | PRN

## 2014-10-14 MED ORDER — SODIUM CHLORIDE 0.9 % IV SOLN: 100.0000 mL | INTRAVENOUS | Status: DC | PRN

## 2014-10-14 MED ORDER — NEPRO/CARBSTEADY PO LIQD: 237.0000 mL | ORAL | Status: DC | PRN

## 2014-10-14 MED ORDER — HEPARIN SODIUM (PORCINE) 1000 UNIT/ML DIALYSIS
1000.0000 [IU] | INTRAMUSCULAR | Status: DC | PRN
  Filled 2014-10-14: qty 1

## 2014-10-14 MED ORDER — SENNOSIDES-DOCUSATE SODIUM 8.6-50 MG PO TABS: 1.0000 | ORAL_TABLET | Freq: Every day | ORAL | Status: DC

## 2014-10-14 MED ORDER — LIDOCAINE HCL (PF) 1 % IJ SOLN: 5.0000 mL | INTRAMUSCULAR | Status: DC | PRN

## 2014-10-14 MED ORDER — ALTEPLASE 2 MG IJ SOLR
2.0000 mg | Freq: Once | INTRAMUSCULAR | Status: DC | PRN
Start: 2014-10-14 — End: 2014-10-14

## 2014-10-14 MED ORDER — METOPROLOL TARTRATE 25 MG PO TABS: 37.5000 mg | ORAL_TABLET | Freq: Two times a day (BID) | ORAL | Status: DC

## 2014-10-14 NOTE — Care Management Note (Signed)
CARE MANAGEMENT NOTE 10/14/2014  Patient:  [name] [name]   Account Number:  [id number]  Date Initiated:  10/14/2014  Documentation initiated by:  Lashan Macias  Subjective/Objective Assessment:   CM following for progression and d/c planning     Action/Plan:   Plan for short term rehab at SNF.   Anticipated DC Date:  10/14/2014   Anticipated DC Plan:  SKILLED NURSING FACILITY         Choice offered to / List presented to:             Status of service:   Medicare Important Message given?   (If response is "NO", the following Medicare IM given date fields will be blank) Date Medicare IM given:   Medicare IM given by:   Date Additional Medicare IM given:   Additional Medicare IM given by:    Discharge Disposition:  [place]  Per UR Regulation:    If discussed at Long Length of Stay Meetings, dates discussed:    Comments:

## 2014-10-14 NOTE — Procedures (Signed)
I have seen and examined this patient and agree with the plan of care . Patient seen on dilaysis and is comfortable receiving his treatment [name] [name] W 10/14/2014, 12:51 PM

## 2014-10-14 NOTE — Progress Notes (Signed)
Report given to nurse at [facility] skilled facility.

## 2014-10-14 NOTE — Discharge Summary (Signed)
Physician Discharge Summary  [name] [name] ZYS:[id number] DOB: 1941-09-27 DOA: 10/05/2014  PCP: [name] Starch, MD  Admit date: 10/05/2014 Discharge date: 10/14/2014  Time spent: greater than 30 minutes  Recommendations for Outpatient Follow-up:  1. Goal supine systolic blood pressure [phone] due to recurrent falls and syncope from orthostatic hypotension  Discharge Diagnoses:  Principal Problem:   Malignant hypertension Active Problems: CKD, now end stage renal failure   Hypercholesterolemia   Heart transplanted   Orthostatic hypotension   Loculated pleural effusion   Weakness generalized   Hearing impaired   DM (diabetes mellitus), type 2 with renal complications   Hypothyroidism   ESRD on dialysis   Discharge Condition: stable  Filed Weights   10/12/14 2137 10/14/14 0752 10/14/14 1214  Weight: 68.9 kg (151 lb 14.4 oz) 69.2 kg (152 lb 8.9 oz) 66.2 kg (145 lb 15.1 oz)    History of present illness:  CDK followed by Nephrology, HTN, prior heart transplant done at [facility], CAD who presents to the ED with weakness and multiple falls. The patient was noted to have markedly elevated bp with sbp in the 200's and worsening renal function with Cr of 5.4 (previous Cr of 3.13 in 10/15). The patient was given 10mg  IV hydralazine with some improvement in bp. Of note, the patient has a hx of sensitivity to aggressive bp meds and has been noted to become hypotensive with treatment. Given renal failure and hypertensive crisis, the hospitalist service was consulted for admission.  Hospital Course:  Admitted to hospitalists telemetry with nephrology consulting.  Diuretics held.   Weakness generalized: likely multifactorial: orthostatic hypotension, deconditioning, worsening renal failure/Uremia, protein calorie malnutrition. Workup significant only for worsening renal function. Infectious workup, TSH, B12, folate cpk and NH3 unremarkable. Patient wears TED hose and noted abdominal  binder at home due to the severity of his orthostasis. He continued to have orthostatic blood pressure drop but was able to ambulate with physical therapy. Creatinine did not improve off diuretics. Patient had a poor appetite weakness and was likely becoming uremic. He had a previous AV fistula placed which has matured and patient started dialysis during this hospitalization. Physical therapy is recommending skilled nursing facility for strengthening. He is medically stable for discharge today.  Malignant hypertension: goal supine SBP 170-180 due to recurrent falls/syncope from orthostatic hypotension. Blood pressure has been better controlled on a higher dose of metoprolol. 25 mg twice daily was changed to 37.5 mg twice daily without significant impairment of his ability to stand and walk.    Progression of CKD, now ESRD: Has been doing fine on dialysis. Outpatient dialysis has been arranged.  He has been dialyzed on a Monday Abigail Butts Friday schedule which I understand will continue as an outpatient.   Orthostatic hypotension: see above. Was able to ambulate with physical therapy in the hallway. would avoid tight control of supine blood pressure. See above    Heart transplanted immunosuppressant therapy was continued   Loculated pleural effusion: No evidence of ongoing infection. Likely chronic. Interventional radiology consulted for thoracentesis, but effusions too small to tap. No further workup needed.   Hearing impaired: has hearing aides  DM 2, diet controlled.  Hypothyroidism: recent TSH ok  Right knee pain: xray negative, nontender, able to bear weight. No evidence of fracture or significant effusion or ligamentous laxity. Ace wrap for comfort. No need for ortho consult  Consultants:  nephrology  Procedures:   Hemodialysis  Discharge Exam: Filed Vitals:   10/14/14 1214  BP: 186/88  Pulse:  69  Temp: 98.1 F (36.7 C)  Resp: 21    General: Alert oriented and  comfortable. Cardiovascular: Regular rate rhythm without murmurs gallops rubs Respiratory: Clear to auscultation bilaterally without wheezes rhonchi or rales Abdomen: Abdominal binder in place. Remedies: TED hose in place. No pitting edema.  Discharge Instructions    Discharge instructions    Complete by:  As directed   Wear ted hose and abdominal binder when out of bed. Renal diet     Walk with assistance    Complete by:  As directed           Current Discharge Medication List    START taking these medications   Details  senna-docusate (SENOKOT-S) 8.6-50 MG per tablet Take 1 tablet by mouth daily.      CONTINUE these medications which have CHANGED   Details  acetaminophen (TYLENOL) 325 MG tablet Take 2 tablets (650 mg total) by mouth every 6 (six) hours as needed for mild pain (or Fever >/= 101).    metoprolol tartrate (LOPRESSOR) 25 MG tablet Take 1.5 tablets (37.5 mg total) by mouth 2 (two) times daily.      CONTINUE these medications which have NOT CHANGED   Details  aspirin EC 325 MG tablet Take 325 mg by mouth at bedtime.    calcitRIOL (ROCALTROL) 0.25 MCG capsule Take 0.25 mcg by mouth every Monday, Wednesday, and Friday.    cholecalciferol (VITAMIN D) 1000 UNITS tablet Take 1,000 Units by mouth daily.    cycloSPORINE modified (NEORAL) 25 MG capsule Take 50 mg by mouth 2 (two) times daily.     diphenoxylate-atropine (LOMOTIL) 2.5-0.025 MG per tablet Take 1 tablet by mouth daily as needed for diarrhea or loose stools.    ferrous sulfate 325 (65 FE) MG tablet Take 325 mg by mouth 3 (three) times daily with meals.    FLUoxetine (PROZAC) 20 MG capsule Take 20 mg by mouth daily with breakfast.    fluticasone (FLONASE) 50 MCG/ACT nasal spray Place 1 spray into both nostrils daily as needed for allergies or rhinitis.    lansoprazole (PREVACID) 15 MG capsule Take 15 mg by mouth 2 (two) times daily.     levothyroxine (SYNTHROID, LEVOTHROID) 75 MCG tablet Take 75 mcg by  mouth daily before breakfast.    magnesium oxide (MAG-OX) 400 MG tablet Take 400 mg by mouth at bedtime.     mycophenolate (CELLCEPT) 250 MG capsule Take 250 mg by mouth 2 (two) times daily.     OVER THE COUNTER MEDICATION Apply 1 application topically daily as needed (for toe nail fungus).    pravastatin (PRAVACHOL) 10 MG tablet Take 10 mg by mouth daily with breakfast.    sodium bicarbonate 650 MG tablet Take 650 mg by mouth 4 (four) times daily.     tamsulosin (FLOMAX) 0.4 MG CAPS capsule Take 0.4 mg by mouth daily after supper.    vitamin B-12 (CYANOCOBALAMIN) 1000 MCG tablet Take 1,000 mcg by mouth daily with breakfast.    promethazine (PHENERGAN) 12.5 MG tablet Take 12.5 mg by mouth every 6 (six) hours as needed for nausea or vomiting.      STOP taking these medications     furosemide (LASIX) 40 MG tablet      meclizine (ANTIVERT) 50 MG tablet      Probiotic Product (PROBIOTIC DAILY PO)        Allergies  Allergen Reactions  . Norvasc [Amlodipine Besylate] Swelling and Other (See Comments)    Swelling of the  lower body       The results of significant diagnostics from this hospitalization (including imaging, microbiology, ancillary and laboratory) are listed below for reference.    Significant Diagnostic Studies: Dg Chest 1 View  10/05/2014   CLINICAL DATA:  CHF, cough  EXAM: CHEST - 1 VIEW  COMPARISON:  07/06/2014  FINDINGS: Partially loculated right pleural effusion at the base. Mild bilateral interstitial thickening. No focal consolidation or pneumothorax. There is stable cardiomegaly. There is evidence of prior CABG.  IMPRESSION: 1. Findings concerning for mild CHF. 2. Loculated right pleural effusion.   Electronically Signed   By: Kathreen Devoid   On: 10/05/2014 14:01   Dg Hip Complete Right  10/05/2014   CLINICAL DATA:  Fall 3 days ago. Right hip pain and right knee pain. Swelling  EXAM: RIGHT HIP - COMPLETE 2+ VIEW  COMPARISON:  None.  FINDINGS: The right hip  appears located. There is mild osteoarthritis changes and possible chondrocalcinosis noted. There is a mild cortical irregularity involving the superior right pubic rami. Nondisplaced fracture cannot be excluded. Degenerative disc disease noted within the lumbar spine.  IMPRESSION: 1. No evidence for hip fracture. 2. Cannot rule out right pubic rami fractures. If there are symptoms referable to this area a CT or MRI of the pelvis may provide a more sensitive assessment for underlying fracture in this area.   Electronically Signed   By: Kerby Moors M.D.   On: 10/05/2014 14:01   Ct Chest Wo Contrast  10/05/2014   CLINICAL DATA:  Followup abnormal chest x-ray.  EXAM: CT CHEST WITHOUT CONTRAST  TECHNIQUE: Multidetector CT imaging of the chest was performed following the standard protocol without IV contrast.  COMPARISON:  Chest x-ray, same date.  FINDINGS: Chest wall: No supraclavicular or axillary mass or lymphadenopathy. Diffuse body wall edema is noted. The thyroid gland is grossly normal. The bony thorax is intact. There is a deep compression fracture of H84 of uncertain age. Recommend correlation with back pain.  Mediastinum: The heart is mildly enlarged. No pericardial effusion. There are scattered mediastinal and hilar lymph nodes but no mass or adenopathy. Calcified lymph nodes are also noted. The aorta demonstrates advanced atherosclerotic calcifications but no focal aneurysm. No obvious coronary artery calcifications. The esophagus is grossly normal.  Lungs: There are loculated bilateral pleural fluid collections, right greater than left. No pulmonary edema or worrisome pulmonary lesions.  Upper abdomen: Advanced atherosclerotic calcifications involving the aorta. Calcified granulomas are noted in the spleen. Diffuse body wall edema is noted suggesting anasarca.  IMPRESSION: 1. Extensive bilateral loculated pleural fluid collections, right greater than left. There is associated compressive atelectasis  but no focal infiltrates. 2. Mild cardiac enlargement but no mediastinal or hilar mass or adenopathy. 3. Diffuse body wall edema may suggest anasarca. 4. T12 compression fracture of uncertain age.   Electronically Signed   By: Kalman Jewels M.D.   On: 10/05/2014 19:18   Ct Pelvis Wo Contrast  10/05/2014   CLINICAL DATA:  Pelvic pain. Multiple falls in the last 3-4 days. Difficulty moving the left leg. Right hip pain.  EXAM: CT PELVIS WITHOUT CONTRAST  TECHNIQUE: Multidetector CT imaging of the pelvis was performed following the standard protocol without intravenous contrast.  COMPARISON:  Radiographs dated 10/05/2014  FINDINGS: There is no fracture or dislocation. No bone destruction. There are slight degenerative changes of both hip joints with joint space narrowing and small marginal osteophytes. No appreciable joint effusions. Pelvic bones are intact. Prominent chronic calcifications in  origins of the inch during tendons bilaterally. No acute abnormalities in those regions. Sacrum and coccyx are intact.  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  The patient is diffuse anasarca.  No mass lesions or adenopathy.  IMPRESSION: No acute osseous abnormality. 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 Minimal degenerative changes of the hips.   Electronically Signed   By: Rozetta Nunnery M.D.   On: 10/05/2014 16:31   [place] Chest  10/06/2014   CLINICAL DATA:  Pleural effusion.  EXAM: CHEST ULTRASOUND  COMPARISON:  CT scan of October 05, 2014.  FINDINGS: Mild amount of fluid is present within the right hemi thorax which appears to be septated. Minimal left pleural effusion is noted.  IMPRESSION: Minimal left pleural effusion. Mild right pleural effusion is noted which appears to be loculated and septated.   Electronically Signed   By: Sabino Dick M.D.   On: 10/06/2014 15:48   Dg Knee Complete 4 Views Right  10/05/2014   CLINICAL DATA:  Right knee pain  after fall.  EXAM: RIGHT KNEE - COMPLETE 4+ VIEW  COMPARISON:  None.  FINDINGS: Diffuse osteopenia is noted. Vascular calcifications are noted. No fracture or dislocation is noted. No significant joint effusion is noted. Mild narrowing of the medial lateral joint spaces is noted with chondrocalcinosis.  IMPRESSION: Mild degenerative joint disease is noted. No acute abnormality seen in the right knee.   Electronically Signed   By: Sabino Dick M.D.   On: 10/05/2014 14:01    Microbiology: No results found for this or any previous visit (from the past 240 hour(s)).   Labs: Basic Metabolic Panel:  Recent Labs Lab 10/08/14 0420 10/09/14 0841 10/10/14 0912 10/12/14 1330 10/14/14 0619  NA 141 140 140 139 137  K 4.7 3.8 3.8 3.6* 4.3  CL 102 100 100 101 100  CO2 20 24 25 27 27   GLUCOSE 74 108* 128* 105* 95  BUN 59* 43* 30* 19 24*  CREATININE 5.89* 4.91* 4.24* 3.52* 3.70*  CALCIUM 8.1* 8.1* 8.1* 7.8* 7.8*  PHOS  --  4.1 3.7 2.9 3.1   Liver Function Tests:  Recent Labs Lab 10/09/14 0841 10/10/14 0912 10/12/14 1330 10/14/14 0619  ALBUMIN 2.5* 2.3* 2.3* 2.3*   No results for input(s): LIPASE, AMYLASE in the last 168 hours.  Recent Labs Lab 10/08/14 0420  AMMONIA 27   CBC:  Recent Labs Lab 10/09/14 0841 10/10/14 0912 10/12/14 1330 10/14/14 0619  WBC 4.8 4.8 3.0* 4.1  HGB 12.7* 12.0* 11.9* 11.3*  HCT 41.9 40.4 39.9 38.1*  MCV 98.1 96.4 96.8 96.5  PLT 175 162 143* 131*   Cardiac Enzymes:  Recent Labs Lab 10/08/14 0420  CKTOTAL 206   BNP: BNP (last 3 results)  Recent Labs  10/05/14 1420  PROBNP >70000.0*   CBG:  Recent Labs Lab 10/11/14 1715 10/12/14 0719 10/13/14 0726 10/13/14 1918 10/14/14 0617  GLUCAP 111* 78 102* 170* 92       Signed:  Shila Kruczek L  [facility] 10/14/2014, 12:35 PM

## 2014-10-14 NOTE — Progress Notes (Signed)
Patient Discharge:  Disposition: Pt discharged to [facility]  IV: Removed  Telemetry: Removed. CCMD notified  Transportation: Via ambulance  Belongings: All belongings taken with pt.

## 2014-10-20 ENCOUNTER — Non-Acute Institutional Stay (SKILLED_NURSING_FACILITY): Payer: 59 | Admitting: Internal Medicine

## 2014-10-20 DIAGNOSIS — E7800 Pure hypercholesterolemia, unspecified: Secondary | ICD-10-CM

## 2014-10-20 DIAGNOSIS — J90 Pleural effusion, not elsewhere classified: Secondary | ICD-10-CM

## 2014-10-20 DIAGNOSIS — N189 Chronic kidney disease, unspecified: Secondary | ICD-10-CM

## 2014-10-20 DIAGNOSIS — I10 Essential (primary) hypertension: Secondary | ICD-10-CM

## 2014-10-20 DIAGNOSIS — M25561 Pain in right knee: Secondary | ICD-10-CM

## 2014-10-20 DIAGNOSIS — I951 Orthostatic hypotension: Secondary | ICD-10-CM

## 2014-10-20 DIAGNOSIS — Z992 Dependence on renal dialysis: Secondary | ICD-10-CM

## 2014-10-20 DIAGNOSIS — E1122 Type 2 diabetes mellitus with diabetic chronic kidney disease: Secondary | ICD-10-CM

## 2014-10-20 DIAGNOSIS — R531 Weakness: Secondary | ICD-10-CM

## 2014-10-20 DIAGNOSIS — Z941 Heart transplant status: Secondary | ICD-10-CM

## 2014-10-20 DIAGNOSIS — N186 End stage renal disease: Secondary | ICD-10-CM

## 2014-10-20 NOTE — Progress Notes (Signed)
MRN: [id number] Name: [name] [name]  Sex: male Age: 74 y.o. DOB: Jun 07, 1941  [place] #: [facility] Facility/Room: 104 Level Of Care: SNF Provider: Inocencio Homes D Emergency Contacts: Extended Emergency Contact Information Primary Emergency Contact: Ripberger,Judy Address: [address], [place] [address] [place] Phone: [phone] Mobile Phone: [phone] Relation: Spouse Secondary Emergency Contact: Auerbach,Chris Address: [place], [place] [address] [place] Phone: [phone] Relation: Son     Allergies: Norvasc  Chief Complaint  Patient presents with  . New Admit To SNF    HPI: Patient is 73 y.o. male who was admitted for hypertensive urgency and acute on chronic renal failure who is admitted to SNF for OT/PT for generalized weakness.  Past Medical History  Diagnosis Date  . CKD III-IV     a. since transplant in 2001 with significant worsening since 06/2013.  Marland Kitchen Hypertension     a. Long h/o HTN, came off of all meds following transplant/wt loss-->meds resumed 06/2013, difficult to control since.  Marland Kitchen History of stroke   . Hypercalcemia     a. 06/2013 Hospitalized in [place].  Marland Kitchen Heart transplanted     a. 1995 CABG x 4 in [place], [place];  b. 2001 Developed CHF;  c. 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 10/2000 Cardiac transplant @ [facility], [place];  e. Historically nl Bx and stress testing.  Last stress test ~ 5 yrs ago.  Marland Kitchen Hernia of abdominal wall   . Hyperlipidemia   . Orthostatic hypotension     a. Has tried support hose and compression stockings - prefers not to wear.  . Coronary artery disease   . Myocardial infarction     PRIOR TO HEART TRANSPLANT  . Shortness of breath     EXERTION  . Orthostatic dizziness   . Stroke 06/2013    NO RESIDUAL - WAS CONFUSED AT TIME - THAT HAS RESOLVED  . Diabetes mellitus without complication     FASTING CBG 90S  .  Headache(784.0)   . Cancer     SKIN  . Anemia   . History of blood transfusion     Past Surgical History  Procedure Laterality Date  . Cardiac surgery    . Heart transplant    . Cataract extraction    . Vasectomy    . Skin graft full thickness leg      cat scratch that did not heal  . Coronary artery bypass graft    . Eye surgery Bilateral     CATARACTS  . Bascilic vein transposition Left 07/06/2014    Procedure: [place];  Surgeon: Rosetta Posner, MD;  Location: [facility] OR;  Service: Vascular;  Laterality: Left;      Medication List       This list is accurate as of: 10/20/14 11:59 PM.  Always use your most recent med list.               acetaminophen 325 MG tablet  Commonly known as:  TYLENOL  Take 2 tablets (650 mg total) by mouth every 6 (six) hours as needed for mild pain (or Fever >/= 101).     aspirin EC 325 MG tablet  Take 325 mg by mouth at bedtime.     calcitRIOL 0.25 MCG capsule  Commonly known as:  ROCALTROL  Take  0.25 mcg by mouth every Monday, Wednesday, and Friday.     cholecalciferol 1000 UNITS tablet  Commonly known as:  VITAMIN D  Take 1,000 Units by mouth daily.     cycloSPORINE modified 25 MG capsule  Commonly known as:  NEORAL  Take 50 mg by mouth 2 (two) times daily.     diphenoxylate-atropine 2.5-0.025 MG per tablet  Commonly known as:  LOMOTIL  Take 1 tablet by mouth daily as needed for diarrhea or loose stools.     ferrous sulfate 325 (65 FE) MG tablet  Take 325 mg by mouth 3 (three) times daily with meals.     FLUoxetine 20 MG capsule  Commonly known as:  PROZAC  Take 20 mg by mouth daily with breakfast.     fluticasone 50 MCG/ACT nasal spray  Commonly known as:  FLONASE  Place 1 spray into both nostrils daily as needed for allergies or rhinitis.     lansoprazole 15 MG capsule  Commonly known as:  PREVACID  Take 15 mg by mouth 2 (two) times daily.     levothyroxine 75 MCG tablet  Commonly known as:  SYNTHROID,  LEVOTHROID  Take 75 mcg by mouth daily before breakfast.     magnesium oxide 400 MG tablet  Commonly known as:  MAG-OX  Take 400 mg by mouth at bedtime.     metoprolol tartrate 25 MG tablet  Commonly known as:  LOPRESSOR  Take 1.5 tablets (37.5 mg total) by mouth 2 (two) times daily.     mycophenolate 250 MG capsule  Commonly known as:  CELLCEPT  Take 250 mg by mouth 2 (two) times daily.     OVER THE COUNTER MEDICATION  Apply 1 application topically daily as needed (for toe nail fungus).     pravastatin 10 MG tablet  Commonly known as:  PRAVACHOL  Take 10 mg by mouth daily with breakfast.     promethazine 12.5 MG tablet  Commonly known as:  PHENERGAN  Take 12.5 mg by mouth every 6 (six) hours as needed for nausea or vomiting.     senna-docusate 8.6-50 MG per tablet  Commonly known as:  Senokot-S  Take 1 tablet by mouth daily.     sodium bicarbonate 650 MG tablet  Take 650 mg by mouth 4 (four) times daily.     tamsulosin 0.4 MG Caps capsule  Commonly known as:  FLOMAX  Take 0.4 mg by mouth daily after supper.     vitamin B-12 1000 MCG tablet  Commonly known as:  CYANOCOBALAMIN  Take 1,000 mcg by mouth daily with breakfast.        No orders of the defined types were placed in this encounter.     There is no immunization history on file for this patient.  History  Substance Use Topics  . Smoking status: Never Smoker   . Smokeless tobacco: Never Used  . Alcohol Use: No    Family history is noncontributory    Review of Systems  DATA OBTAINED: from patient GENERAL:  no fevers, appetite changes SKIN: No itching, rash or wounds EYES: No eye pain, redness, discharge EARS: No earache, tinnitus, change in hearing NOSE: No congestion, drainage or bleeding  MOUTH/THROAT: No mouth or tooth pain, No sore throat RESPIRATORY: No cough, wheezing, SOB CARDIAC: No chest pain, palpitations, lower extremity edema  GI: No abdominal pain, No N/V/D or constipation, No  heartburn or reflux  GU: No dysuria, frequency or urgency, or incontinence  MUSCULOSKELETAL: No  unrelieved bone/joint pain NEUROLOGIC: No headache, dizziness  PSYCHIATRIC: No overt anxiety or sadness, No behavior issue.   Filed Vitals:   10/20/14 2209  BP: 194/88  Pulse: 71  Temp: 97 F (36.1 C)  Resp: 20    Physical Exam  GENERAL APPEARANCE: Alert,mod conversant,  No acute distress.  SKIN: No diaphoresis rash HEAD: Normocephalic, atraumatic  EYES: Conjunctiva/lids clear. Pupils round, reactive. EOMs intact.  EARS: External exam WNL, canals clear. Hearing grossly normal.  NOSE: No deformity or discharge.  MOUTH/THROAT: Lips w/o lesions  RESPIRATORY: Breathing is even, unlabored. Lung sounds are clear   CARDIOVASCULAR: Heart RRR no murmurs, rubs or gallops. No peripheral edema.   GASTROINTESTINAL: Abdomen is soft, non-tender, not distended w/ normal bowel sounds. GENITOURINARY: Bladder non tender, not distended  MUSCULOSKELETAL: No abnormal joints or musculature NEUROLOGIC:  Cranial nerves 2-12 grossly intact. Moves all extremities  PSYCHIATRIC: Mild dementia, no behavioral issues  Patient Active Problem List   Diagnosis Date Noted  . Knee pain, right 10/21/2014  . ESRD on dialysis 10/12/2014  . Loculated pleural effusion 10/06/2014  . Weakness generalized 10/06/2014  . Hearing impaired 10/06/2014  . DM (diabetes mellitus), type 2 with renal complications 82/95/6213  . Hypothyroidism 10/06/2014  . Hypertension 10/05/2014  . Hypertensive crisis 10/05/2014  . End stage renal disease 08/04/2014  . Near syncope 07/06/2014  . Malignant hypertension 06/21/2014  . Heart transplanted   . Hypertensive heart disease   . CKD III-IV   . Orthostatic hypotension   . Acute on chronic renal failure 06/20/2014  . Hypertensive chronic kidney disease 06/20/2014  . Type II or unspecified type diabetes mellitus with unspecified complication, uncontrolled 06/20/2014  . Hypothyroidism  06/20/2014  . Hypercholesterolemia 06/20/2014  . CMV (cytomegalovirus infection) status positive 06/20/2014    CBC    Component Value Date/Time   WBC 4.1 10/14/2014 0619   RBC 3.95* 10/14/2014 0619   RBC 2.93* 08/25/2014 1831   HGB 11.3* 10/14/2014 0619   HCT 38.1* 10/14/2014 0619   PLT 131* 10/14/2014 0619   MCV 96.5 10/14/2014 0619   LYMPHSABS 1.5 10/05/2014 1420   MONOABS 0.3 10/05/2014 1420   EOSABS 0.0 10/05/2014 1420   BASOSABS 0.0 10/05/2014 1420    CMP     Component Value Date/Time   NA 137 10/14/2014 0619   K 4.3 10/14/2014 0619   CL 100 10/14/2014 0619   CO2 27 10/14/2014 0619   GLUCOSE 95 10/14/2014 0619   BUN 24* 10/14/2014 0619   CREATININE 3.70* 10/14/2014 0619   CALCIUM 7.8* 10/14/2014 0619   PROT 5.1* 10/06/2014 0700   ALBUMIN 2.3* 10/14/2014 0619   AST 9 10/06/2014 0700   ALT 6 10/06/2014 0700   ALKPHOS 78 10/06/2014 0700   BILITOT 0.4 10/06/2014 0700   GFRNONAA 15* 10/14/2014 0619   GFRAA 17* 10/14/2014 0619    Assessment and Plan  Malignant hypertension goal supine SBP 170-180 due to recurrent falls/syncope from orthostatic hypotension. Blood pressure has been better controlled on a higher dose of metoprolol. 25 mg twice daily was changed to 37.5 mg twice daily without significant impairment of his ability to stand and walk  Orthostatic hypotension Becomes hypotensive with BP meds;need to be careful with dosing; goal supine SBP 170-180 due to recurrent falls/syncope from orthostatic hypotension  Weakness generalized : likely multifactorial: orthostatic hypotension, deconditioning, worsening renal failure/Uremia, protein calorie malnutrition. Workup significant only for worsening renal function. Infectious workup, TSH, B12, folate cpk and NH3 unremarkable. Patient wears TED hose and  noted abdominal binder at home due to the severity of his orthostasis. He continued to have orthostatic blood pressure drop but was able to ambulate with physical  therapy. Creatinine did not improve off diuretics. Patient had a poor appetite weakness and was likely becoming uremic. He had a previous AV fistula placed which has matured and patient started dialysis during this hospitalization. Physical therapy is recommending skilled nursing facility for strengthening. He is medically stable for discharge today.  ESRD on dialysis Has been doing fine on dialysis. Outpatient dialysis has been arranged. He has been dialyzed on a Monday Abigail Butts Friday schedule which I understand will continue as an outpatient.   Heart transplanted immunosuppressant therapy was continued  Knee pain, right xray negative, nontender, able to bear weight. No evidence of fracture or significant effusion or ligamentous laxity. Ace wrap for comfort. No need for ortho consult  Loculated pleural effusion No evidence of ongoing infection. Likely chronic. Interventional radiology consulted for thoracentesis, but effusions too small to tap. No further workup needed.  DM (diabetes mellitus), type 2 with renal complications Diet controlled  Hypothyroidism Lst TSH fine;cont current dose 75 mcg  Hypercholesterolemia LDL 74, HDL 29 on current therapy pravachol 40 mg    Hennie Duos, MD

## 2014-10-21 ENCOUNTER — Encounter: Payer: Self-pay | Admitting: Internal Medicine

## 2014-10-21 DIAGNOSIS — M25561 Pain in right knee: Secondary | ICD-10-CM | POA: Insufficient documentation

## 2014-10-21 NOTE — Assessment & Plan Note (Signed)
No evidence of ongoing infection. Likely chronic. Interventional radiology consulted for thoracentesis, but effusions too small to tap. No further workup needed.

## 2014-10-21 NOTE — Assessment & Plan Note (Signed)
Becomes hypotensive with BP meds;need to be careful with dosing; goal supine SBP 170-180 due to recurrent falls/syncope from orthostatic hypotension

## 2014-10-21 NOTE — Assessment & Plan Note (Signed)
Diet controlled.  

## 2014-10-21 NOTE — Assessment & Plan Note (Signed)
xray negative, nontender, able to bear weight. No evidence of fracture or significant effusion or ligamentous laxity. Ace wrap for comfort. No need for ortho consult

## 2014-10-21 NOTE — Assessment & Plan Note (Signed)
Lst TSH fine;cont current dose 75 mcg

## 2014-10-21 NOTE — Assessment & Plan Note (Signed)
immunosuppressant therapy was continued

## 2014-10-21 NOTE — Assessment & Plan Note (Signed)
Has been doing fine on dialysis. Outpatient dialysis has been arranged. He has been dialyzed on a Monday [name] [name] Friday schedule which I understand will continue as an outpatient.

## 2014-10-21 NOTE — Assessment & Plan Note (Signed)
LDL 74, HDL 29 on current therapy pravachol 40 mg

## 2014-10-21 NOTE — Assessment & Plan Note (Signed)
:   likely multifactorial: orthostatic hypotension, deconditioning, worsening renal failure/Uremia, protein calorie malnutrition. Workup significant only for worsening renal function. Infectious workup, TSH, B12, folate cpk and NH3 unremarkable. Patient wears TED hose and noted abdominal binder at home due to the severity of his orthostasis. He continued to have orthostatic blood pressure drop but was able to ambulate with physical therapy. Creatinine did not improve off diuretics. Patient had a poor appetite weakness and was likely becoming uremic. He had a previous AV fistula placed which has matured and patient started dialysis during this hospitalization. Physical therapy is recommending skilled nursing facility for strengthening. He is medically stable for discharge today.

## 2014-10-21 NOTE — Assessment & Plan Note (Signed)
goal supine SBP 170-180 due to recurrent falls/syncope from orthostatic hypotension. Blood pressure has been better controlled on a higher dose of metoprolol. 25 mg twice daily was changed to 37.5 mg twice daily without significant impairment of his ability to stand and walk

## 2014-10-30 ENCOUNTER — Non-Acute Institutional Stay (SKILLED_NURSING_FACILITY): Payer: Medicare Other | Admitting: Internal Medicine

## 2014-10-30 DIAGNOSIS — R531 Weakness: Secondary | ICD-10-CM

## 2014-10-30 DIAGNOSIS — Z941 Heart transplant status: Secondary | ICD-10-CM

## 2014-10-30 DIAGNOSIS — I951 Orthostatic hypotension: Secondary | ICD-10-CM

## 2014-10-30 DIAGNOSIS — Z992 Dependence on renal dialysis: Secondary | ICD-10-CM

## 2014-10-30 DIAGNOSIS — E119 Type 2 diabetes mellitus without complications: Secondary | ICD-10-CM | POA: Diagnosis not present

## 2014-10-30 DIAGNOSIS — N186 End stage renal disease: Secondary | ICD-10-CM

## 2014-10-30 DIAGNOSIS — I10 Essential (primary) hypertension: Secondary | ICD-10-CM | POA: Diagnosis not present

## 2014-10-30 NOTE — Progress Notes (Signed)
Patient ID: [name] [name], male   DOB: 12/17/1940, 73 y.o.   MRN: [id number] MRN: [id number] Name: [name] [name]  Sex: male Age: 73 y.o. DOB: [birth year]  [place] #: [facility] Facility/Room: 104 Level Of Care: SNF Provider: Wille Celeste Emergency Contacts: Extended Emergency Contact Information Primary Emergency Contact: Gaxiola,Judy Address: [address] [place], [place] [address] [place] Phone: [phone] Mobile Phone: [phone] Relation: Spouse Secondary Emergency Contact: Siharath,Chris Address: [place], [place] [address] [place] Phone: [phone] Relation: Son     Allergies: Norvasc  Chief Complaint  Patient presents with  . Discharge Note    HPI: Patient is 73 y.o. male who was admitted for hypertensive urgency and acute on chronic renal failure who is admitted to SNF for OT/PT for generalized weakness--- has a very supportive family in fact his wife and son are in the room today.  He has begun dialysis secondary to end-stage renal disease and apparently is tolerating this fairly well.  Does have a history of orthostatic hypotension that is symptomatic with syncope-goal is systolic [phone] and his blood pressure is 170 on exam today-he is on metoprolol this was recently increased apparently he has tolerated this-he continues with an abdominal binder as well secondary to the orthostatic issues he has progressed well with therapy he will be going home but will need extensive nursing for PT and OT as well as a rolling walker for ambulation.  Although a very fragile individual with numerous diagnoses these appear to be stable-he does have a history of heart transplant continues on immunosuppressive therapy.  Also history of diabetes type 2 which has been diet controlled --  Past Medical History  Diagnosis Date  . CKD III-IV     a. since transplant in 2001 with  significant worsening since 06/2013.  Marland Kitchen Hypertension     a. Long h/o HTN, came off of all meds following transplant/wt loss-->meds resumed 06/2013, difficult to control since.  Marland Kitchen History of stroke   . Hypercalcemia     a. 06/2013 Hospitalized in [place].  Marland Kitchen Heart transplanted     a. 1995 CABG x 4 in [place], [place];  b. 2001 Developed CHF;  c. 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 10/2000 Cardiac transplant @ [facility], [place];  e. Historically nl Bx and stress testing.  Last stress test ~ 5 yrs ago.  Marland Kitchen Hernia of abdominal wall   . Hyperlipidemia   . Orthostatic hypotension     a. Has tried support hose and compression stockings - prefers not to wear.  . Coronary artery disease   . Myocardial infarction     PRIOR TO HEART TRANSPLANT  . Shortness of breath     EXERTION  . Orthostatic dizziness   . Stroke 06/2013    NO RESIDUAL - WAS CONFUSED AT TIME - THAT HAS RESOLVED  . Diabetes mellitus without complication     FASTING CBG 90S  . Headache(784.0)   . Cancer     SKIN  . Anemia   . History of blood transfusion     Past Surgical History  Procedure Laterality Date  . Cardiac surgery    . Heart transplant    . Cataract extraction    . Vasectomy    . Skin graft full thickness leg      cat scratch  that did not heal  . Coronary artery bypass graft    . Eye surgery Bilateral     CATARACTS  . Bascilic vein transposition Left 07/06/2014    Procedure: [place];  Surgeon: Rosetta Posner, MD;  Location: [facility];  Service: Vascular;  Laterality: Left;      Medication List       This list is accurate as of: 10/30/14 11:59 PM.  Always use your most recent med list.               acetaminophen 325 MG tablet  Commonly known as:  TYLENOL  Take 2 tablets (650 mg total) by mouth every 6 (six) hours as needed for mild pain (or Fever >/= 101).     aspirin EC 325 MG tablet  Take 325 mg by mouth at bedtime.     calcitRIOL 0.25 MCG capsule  Commonly known as:  ROCALTROL  Take  0.25 mcg by mouth every Monday, Wednesday, and Friday.     cholecalciferol 1000 UNITS tablet  Commonly known as:  VITAMIN D  Take 1,000 Units by mouth daily.     cycloSPORINE modified 25 MG capsule  Commonly known as:  NEORAL  Take 50 mg by mouth 2 (two) times daily.     diphenoxylate-atropine 2.5-0.025 MG per tablet  Commonly known as:  LOMOTIL  Take 1 tablet by mouth daily as needed for diarrhea or loose stools.     ferrous sulfate 325 (65 FE) MG tablet  Take 325 mg by mouth 3 (three) times daily with meals.     FLUoxetine 20 MG capsule  Commonly known as:  PROZAC  Take 20 mg by mouth daily with breakfast.     fluticasone 50 MCG/ACT nasal spray  Commonly known as:  FLONASE  Place 1 spray into both nostrils daily as needed for allergies or rhinitis.     lansoprazole 15 MG capsule  Commonly known as:  PREVACID  Take 15 mg by mouth 2 (two) times daily.     levothyroxine 75 MCG tablet  Commonly known as:  SYNTHROID, LEVOTHROID  Take 75 mcg by mouth daily before breakfast.     magnesium oxide 400 MG tablet  Commonly known as:  MAG-OX  Take 400 mg by mouth at bedtime.     metoprolol tartrate 25 MG tablet  Commonly known as:  LOPRESSOR  Take 1.5 tablets (37.5 mg total) by mouth 2 (two) times daily.     mycophenolate 250 MG capsule  Commonly known as:  CELLCEPT  Take 250 mg by mouth 2 (two) times daily.     OVER THE COUNTER MEDICATION  Apply 1 application topically daily as needed (for toe nail fungus).     pravastatin 10 MG tablet  Commonly known as:  PRAVACHOL  Take 10 mg by mouth daily with breakfast.     promethazine 12.5 MG tablet  Commonly known as:  PHENERGAN  Take 12.5 mg by mouth every 6 (six) hours as needed for nausea or vomiting.     senna-docusate 8.6-50 MG per tablet  Commonly known as:  Senokot-S  Take 1 tablet by mouth daily.     sodium bicarbonate 650 MG tablet  Take 650 mg by mouth 4 (four) times daily.     tamsulosin 0.4 MG Caps capsule   Commonly known as:  FLOMAX  Take 0.4 mg by mouth daily after supper.     vitamin B-12 1000 MCG tablet  Commonly known as:  CYANOCOBALAMIN  Take 1,000 mcg by mouth daily with breakfast.        No orders of the defined types were placed in this encounter.     There is no immunization history on file for this patient.  History  Substance Use Topics  . Smoking status: Never Smoker   . Smokeless tobacco: Never Used  . Alcohol Use: No    Family history is noncontributory    Review of Systems  DATA OBTAINED: from patient GENERAL:  no fevers, appetite changes SKIN: No itching, rash or wounds EYES: No eye pain, redness, discharge EARS: No earache, tinnitus, change in hearing NOSE: No congestion, drainage or bleeding  MOUTH/THROAT: No mouth or tooth pain, No sore throat RESPIRATORY: No cough, wheezing, SOB CARDIAC: No chest pain, palpitations, lower extremity edema  GI: No abdominal pain, No N/V/D or constipation, No heartburn or reflux  GU: No dysuria, frequency or urgency, or incontinence  MUSCULOSKELETAL: No unrelieved bone/joint pain NEUROLOGIC: No headache, dizziness  PSYCHIATRIC: No overt anxiety or sadness, No behavior issue.   There were no vitals filed for this visit.  Physical Exam Temperature 97.9 pulse 67 respirations 20 blood pressure 170/95 GENERAL APPEARANCE: Alert,mod conversant,  No acute distressss.  SKIN: No diaphoresis rash--shunt  site upper right arm bruit appreciated HEAD: Normocephalic, atraumatic  EYES: Conjunctiva/lids clear. Pupils round, reactive. EOMs intact.  EARS: External exam WNL, canals clear. Hearing grossly normal.  NOSE: No deformity or discharge.  MOUTH/THROAT: Lips w/o lesions  RESPIRATORY: Breathing is even, unlabored. Lung sounds are clear   CARDIOVASCULAR: Heart RRR no murmurs, rubs or gallops. No peripheral edema.   GASTROINTESTINAL: Abdomen is soft, non-tender, not distended w/ normal bowel sounds.--Abdominal binder is in  place GENITOURINARY: Bladder non tender, not distended  MUSCULOSKELETAL: No abnormal joints or musculature--general weakness he does ambulate with a rolling walker NEUROLOGIC:  Cranial nerves 2-12 grossly intact. Moves all extremities  PSYCHIATRIC: Mild dementia, no behavioral issues  Patient Active Problem List   Diagnosis Date Noted  . Diabetes type 2, controlled 11/01/2014  . Knee pain, right 10/21/2014  . ESRD on dialysis 10/12/2014  . Loculated pleural effusion 10/06/2014  . Weakness generalized 10/06/2014  . Hearing impaired 10/06/2014  . DM (diabetes mellitus), type 2 with renal complications 75/64/3329  . Hypothyroidism 10/06/2014  . Hypertension 10/05/2014  . Hypertensive crisis 10/05/2014  . End stage renal disease 08/04/2014  . Near syncope 07/06/2014  . Malignant hypertension 06/21/2014  . Heart transplanted   . Hypertensive heart disease   . CKD III-IV   . Orthostatic hypotension   . Acute on chronic renal failure 06/20/2014  . Hypertensive chronic kidney disease 06/20/2014  . Type II or unspecified type diabetes mellitus with unspecified complication, uncontrolled 06/20/2014  . Hypothyroidism 06/20/2014  . Hypercholesterolemia 06/20/2014  . CMV (cytomegalovirus infection) status positive 06/20/2014    CBC    Component Value Date/Time   WBC 4.1 10/14/2014 0619   RBC 3.95* 10/14/2014 0619   RBC 2.93* 08/25/2014 1831   HGB 11.3* 10/14/2014 0619   HCT 38.1* 10/14/2014 0619   PLT 131* 10/14/2014 0619   MCV 96.5 10/14/2014 0619   LYMPHSABS 1.5 10/05/2014 1420   MONOABS 0.3 10/05/2014 1420   EOSABS 0.0 10/05/2014 1420   BASOSABS 0.0 10/05/2014 1420    CMP     Component Value Date/Time   NA 137 10/14/2014 0619   K 4.3 10/14/2014 0619   CL 100 10/14/2014 0619   CO2 27 10/14/2014 0619   GLUCOSE 95 10/14/2014  2111   BUN 24* 10/14/2014 0619   CREATININE 3.70* 10/14/2014 0619   CALCIUM 7.8* 10/14/2014 0619   PROT 5.1* 10/06/2014 0700   ALBUMIN 2.3*  10/14/2014 0619   AST 9 10/06/2014 0700   ALT 6 10/06/2014 0700   ALKPHOS 78 10/06/2014 0700   BILITOT 0.4 10/06/2014 0700   GFRNONAA 15* 10/14/2014 0619   GFRAA 17* 10/14/2014 0619    Assessment and Plan   Over 1-history of weakness-likely multi factor with history of orthostatic hypotension and end-stage renal disease.  He has gained strength but still has significant debility will need continued PT and OT nursing support-as well as a rolling walker for ambulation  He has begun dialysis.  #2-history of heart transplant he does continue on immunosuppressive therapy appears to be doing relatively well in March.  #3 history of orthostatic hypotension-again goal systolic is [id number] apparently has been stable during his stay here he does wear an abdominal binder  #4-history of diabetes type 2-this is diet-controlled.  #5-history of hypothyroidism-recent TSH was within normal limits he is on Synthroid.  #6-history of loculated pleural effusion-it is thought this is chronic--no further workup recommended per last evaluation.  #7-history hyperlipidemia-he is on Pravachol LDL recently 74 HDL 29.  Again patient will be discharged home with PT OT and nursing support as well as a rolling walker-will need expedient follow-up by primary care provider-continues on dialysis for end-stage renal disease  [id number]-of note greater than 30 minutes spent on this discharge summary-scripts have been written  No problem-specific assessment & plan notes found for this encounter.   Gabreil Yonkers C,

## 2014-11-01 ENCOUNTER — Encounter: Payer: Self-pay | Admitting: Internal Medicine

## 2014-11-01 DIAGNOSIS — E119 Type 2 diabetes mellitus without complications: Secondary | ICD-10-CM | POA: Insufficient documentation

## 2014-11-06 ENCOUNTER — Telehealth (HOSPITAL_COMMUNITY): Payer: Self-pay | Admitting: Vascular Surgery

## 2014-11-06 NOTE — Telephone Encounter (Signed)
PT wife called pt will be switching to [place]  She would like doctor bensimhon to order  anti rejection medications.. Pt wife would like to talk to [facility] about seeing heart transplant doctor in @ [place].Marland Kitchen PLEASE ADVISE

## 2014-11-06 NOTE — Telephone Encounter (Signed)
Spoke w/pt's wife she states it is too hard for them to travel to [place], [place] for post transplant care and they wish to transfer everything here, will discuss w/Dr Bensimhon who he would recommend, she states he has plenty of meds for now

## 2014-11-11 ENCOUNTER — Ambulatory Visit: Payer: Medicare HMO | Admitting: Physical Therapy

## 2014-11-11 NOTE — Telephone Encounter (Signed)
Can contact Transplant program at [facility].

## 2014-12-04 ENCOUNTER — Emergency Department (HOSPITAL_COMMUNITY): Payer: Medicare HMO

## 2014-12-04 ENCOUNTER — Encounter (HOSPITAL_COMMUNITY): Payer: Self-pay | Admitting: *Deleted

## 2014-12-04 ENCOUNTER — Emergency Department (HOSPITAL_COMMUNITY)
Admission: EM | Admit: 2014-12-04 | Discharge: 2014-12-04 | Disposition: A | Discharge status: Home / Self Care | Payer: Medicare HMO | Attending: Emergency Medicine | Admitting: Emergency Medicine

## 2014-12-04 DIAGNOSIS — R52 Pain, unspecified: Secondary | ICD-10-CM

## 2014-12-04 DIAGNOSIS — Z941 Heart transplant status: Secondary | ICD-10-CM | POA: Diagnosis not present

## 2014-12-04 DIAGNOSIS — I2510 Atherosclerotic heart disease of native coronary artery without angina pectoris: Secondary | ICD-10-CM | POA: Insufficient documentation

## 2014-12-04 DIAGNOSIS — W1839XA Other fall on same level, initial encounter: Secondary | ICD-10-CM | POA: Diagnosis not present

## 2014-12-04 DIAGNOSIS — I129 Hypertensive chronic kidney disease with stage 1 through stage 4 chronic kidney disease, or unspecified chronic kidney disease: Secondary | ICD-10-CM | POA: Insufficient documentation

## 2014-12-04 DIAGNOSIS — S7001XA Contusion of right hip, initial encounter: Secondary | ICD-10-CM | POA: Diagnosis not present

## 2014-12-04 DIAGNOSIS — I252 Old myocardial infarction: Secondary | ICD-10-CM | POA: Diagnosis not present

## 2014-12-04 DIAGNOSIS — E785 Hyperlipidemia, unspecified: Secondary | ICD-10-CM | POA: Diagnosis not present

## 2014-12-04 DIAGNOSIS — Y9289 Other specified places as the place of occurrence of the external cause: Secondary | ICD-10-CM | POA: Insufficient documentation

## 2014-12-04 DIAGNOSIS — Z859 Personal history of malignant neoplasm, unspecified: Secondary | ICD-10-CM | POA: Insufficient documentation

## 2014-12-04 DIAGNOSIS — E119 Type 2 diabetes mellitus without complications: Secondary | ICD-10-CM | POA: Insufficient documentation

## 2014-12-04 DIAGNOSIS — S79911A Unspecified injury of right hip, initial encounter: Secondary | ICD-10-CM | POA: Diagnosis present

## 2014-12-04 DIAGNOSIS — Y9301 Activity, walking, marching and hiking: Secondary | ICD-10-CM | POA: Diagnosis not present

## 2014-12-04 DIAGNOSIS — Z7982 Long term (current) use of aspirin: Secondary | ICD-10-CM | POA: Diagnosis not present

## 2014-12-04 DIAGNOSIS — D649 Anemia, unspecified: Secondary | ICD-10-CM | POA: Diagnosis not present

## 2014-12-04 DIAGNOSIS — Z8673 Personal history of transient ischemic attack (TIA), and cerebral infarction without residual deficits: Secondary | ICD-10-CM | POA: Diagnosis not present

## 2014-12-04 DIAGNOSIS — Z79899 Other long term (current) drug therapy: Secondary | ICD-10-CM | POA: Insufficient documentation

## 2014-12-04 DIAGNOSIS — Y998 Other external cause status: Secondary | ICD-10-CM | POA: Insufficient documentation

## 2014-12-04 DIAGNOSIS — N184 Chronic kidney disease, stage 4 (severe): Secondary | ICD-10-CM | POA: Diagnosis not present

## 2014-12-04 DIAGNOSIS — T1490XA Injury, unspecified, initial encounter: Secondary | ICD-10-CM

## 2014-12-04 DIAGNOSIS — W19XXXA Unspecified fall, initial encounter: Secondary | ICD-10-CM

## 2014-12-04 LAB — I-STAT CHEM 8, ED
BUN: 38 mg/dL — ABNORMAL HIGH (ref 6–23)
CALCIUM ION: 1.03 mmol/L — AB (ref 1.13–1.30)
Chloride: 93 mEq/L — ABNORMAL LOW (ref 96–112)
Creatinine, Ser: 3.1 mg/dL — ABNORMAL HIGH (ref 0.50–1.35)
Glucose, Bld: 145 mg/dL — ABNORMAL HIGH (ref 70–99)
HCT: 33 % — ABNORMAL LOW (ref 39.0–52.0)
Hemoglobin: 11.2 g/dL — ABNORMAL LOW (ref 13.0–17.0)
POTASSIUM: 3.6 mmol/L (ref 3.5–5.1)
SODIUM: 138 mmol/L (ref 135–145)
TCO2: 30 mmol/L (ref 0–100)

## 2014-12-04 MED ORDER — HYDROCODONE-ACETAMINOPHEN 5-325 MG PO TABS: 1.0000 | ORAL_TABLET | Freq: Four times a day (QID) | ORAL | Status: DC | PRN

## 2014-12-04 NOTE — ED Notes (Signed)
Pt is a dialysis pt dialyzed yesterday.  Fistula lt arm

## 2014-12-04 NOTE — ED Provider Notes (Signed)
CSN: [id number]     Arrival date & time 12/04/14  1532 History   First MD Initiated Contact with Patient 12/04/14 1742     Chief Complaint  Patient presents with  . Hip Pain     (Consider location/radiation/quality/duration/timing/severity/associated sxs/prior Treatment) Patient is a 74 y.o. male presenting with hip pain. The history is provided by the patient and a relative.  Hip Pain This is a new (pt finishing up at at dialysis yesterday and standing in the lobby and lost balance and fell on the floor) problem. The current episode started yesterday. The problem occurs constantly. The problem has not changed since onset.Pertinent negatives include no abdominal pain, no headaches and no shortness of breath. Associated symptoms comments: Unable to bear weight on the right hip due to pain since fall yesterday.. The symptoms are aggravated by standing. The symptoms are relieved by rest. He has tried rest for the symptoms. The treatment provided no relief.    Past Medical History  Diagnosis Date  . CKD III-IV     a. since transplant in 2001 with significant worsening since 06/2013.  Marland Kitchen Hypertension     a. Long h/o HTN, came off of all meds following transplant/wt loss-->meds resumed 06/2013, difficult to control since.  Marland Kitchen History of stroke   . Hypercalcemia     a. 06/2013 Hospitalized in [place].  Marland Kitchen Heart transplanted     a. 1995 CABG x 4 in [place], [place];  b. 2001 Developed CHF;  c. 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 10/2000 Cardiac transplant @ [facility], [place];  e. Historically nl Bx and stress testing.  Last stress test ~ 5 yrs ago.  Marland Kitchen Hernia of abdominal wall   . Hyperlipidemia   . Orthostatic hypotension     a. Has tried support hose and compression stockings - prefers not to wear.  . Coronary artery disease   . Myocardial infarction     PRIOR TO HEART TRANSPLANT  . Shortness of breath     EXERTION  . Orthostatic dizziness   . Stroke 06/2013    NO RESIDUAL - WAS CONFUSED AT TIME -  THAT HAS RESOLVED  . Diabetes mellitus without complication     FASTING CBG 90S  . Headache(784.0)   . Cancer     SKIN  . Anemia   . History of blood transfusion    Past Surgical History  Procedure Laterality Date  . Cardiac surgery    . Heart transplant    . Cataract extraction    . Vasectomy    . Skin graft full thickness leg      cat scratch that did not heal  . Coronary artery bypass graft    . Eye surgery Bilateral     CATARACTS  . Bascilic vein transposition Left 07/06/2014    Procedure: [place];  Surgeon: Rosetta Posner, MD;  Location: [facility] OR;  Service: Vascular;  Laterality: Left;   Family History  Problem Relation Age of Onset  . Heart attack Father     died @ 3  . Heart attack Mother    History  Substance Use Topics  . Smoking status: Never Smoker   . Smokeless tobacco: Never Used  . Alcohol Use: No    Review of Systems  Respiratory: Negative for chest tightness and shortness of breath.   Gastrointestinal: Negative for nausea, vomiting and abdominal pain.  Neurological: Negative for dizziness, syncope and headaches.  All other systems reviewed and are negative.  Allergies  Norvasc  Home Medications   Prior to Admission medications   Medication Sig Start Date End Date Taking? Authorizing Provider  acetaminophen (TYLENOL) 325 MG tablet Take 2 tablets (650 mg total) by mouth every 6 (six) hours as needed for mild pain (or Fever >/= 101). 10/14/14   Delfina Redwood, MD  aspirin EC 325 MG tablet Take 325 mg by mouth at bedtime.    Historical Provider, MD  calcitRIOL (ROCALTROL) 0.25 MCG capsule Take 0.25 mcg by mouth every Monday, Wednesday, and Friday.    Historical Provider, MD  cholecalciferol (VITAMIN D) 1000 UNITS tablet Take 1,000 Units by mouth daily.    Historical Provider, MD  cycloSPORINE modified (NEORAL) 25 MG capsule Take 50 mg by mouth 2 (two) times daily.  08/18/14   Lelon Perla, MD  diphenoxylate-atropine  (LOMOTIL) 2.5-0.025 MG per tablet Take 1 tablet by mouth daily as needed for diarrhea or loose stools.    Historical Provider, MD  ferrous sulfate 325 (65 FE) MG tablet Take 325 mg by mouth 3 (three) times daily with meals.    Historical Provider, MD  FLUoxetine (PROZAC) 20 MG capsule Take 20 mg by mouth daily with breakfast.    Historical Provider, MD  fluticasone (FLONASE) 50 MCG/ACT nasal spray Place 1 spray into both nostrils daily as needed for allergies or rhinitis.    Historical Provider, MD  lansoprazole (PREVACID) 15 MG capsule Take 15 mg by mouth 2 (two) times daily.     Historical Provider, MD  levothyroxine (SYNTHROID, LEVOTHROID) 75 MCG tablet Take 75 mcg by mouth daily before breakfast.    Historical Provider, MD  magnesium oxide (MAG-OX) 400 MG tablet Take 400 mg by mouth at bedtime.     Historical Provider, MD  metoprolol tartrate (LOPRESSOR) 25 MG tablet Take 1.5 tablets (37.5 mg total) by mouth 2 (two) times daily. 10/14/14   Delfina Redwood, MD  mycophenolate (CELLCEPT) 250 MG capsule Take 250 mg by mouth 2 (two) times daily.     Historical Provider, MD  OVER THE COUNTER MEDICATION Apply 1 application topically daily as needed (for toe nail fungus).    Historical Provider, MD  pravastatin (PRAVACHOL) 10 MG tablet Take 10 mg by mouth daily with breakfast.    Historical Provider, MD  promethazine (PHENERGAN) 12.5 MG tablet Take 12.5 mg by mouth every 6 (six) hours as needed for nausea or vomiting.    Historical Provider, MD  senna-docusate (SENOKOT-S) 8.6-50 MG per tablet Take 1 tablet by mouth daily. 10/14/14   Delfina Redwood, MD  sodium bicarbonate 650 MG tablet Take 650 mg by mouth 4 (four) times daily.     Historical Provider, MD  tamsulosin (FLOMAX) 0.4 MG CAPS capsule Take 0.4 mg by mouth daily after supper.    Historical Provider, MD  vitamin B-12 (CYANOCOBALAMIN) 1000 MCG tablet Take 1,000 mcg by mouth daily with breakfast.    Historical Provider, MD   BP 190/69 mmHg   Pulse 64  Temp(Src) 97.7 F (36.5 C) (Oral)  Resp 16  Ht 6\' 2"  (1.88 m)  Wt 147 lb (66.679 kg)  BMI 18.87 kg/m2  SpO2 100% Physical Exam  Constitutional: He is oriented to person, place, and time. He appears well-developed and well-nourished. No distress.  HENT:  Head: Normocephalic and atraumatic.  Mouth/Throat: Oropharynx is clear and moist.  Eyes: Conjunctivae and EOM are normal. Pupils are equal, round, and reactive to light.  Neck: Normal range of motion. Neck supple.  Cardiovascular:  Normal rate.   Pulmonary/Chest: Effort normal.  Musculoskeletal: Normal range of motion. He exhibits tenderness. He exhibits no edema.       Right hip: He exhibits tenderness and bony tenderness. He exhibits normal range of motion, normal strength and no deformity.       Legs: Neurological: He is alert and oriented to person, place, and time.  Skin: Skin is warm and dry. No rash noted. No erythema.  Psychiatric: He has a normal mood and affect. His behavior is normal.  Nursing note and vitals reviewed.   ED Course  Procedures (including critical care time) Labs Review Labs Reviewed - No data to display  Imaging Review Ct Hip Right Wo Contrast  12/04/2014   CLINICAL DATA:  Increasing right hip pain after fall yesterday. Unable to bear weight on right leg.  EXAM: CT OF THE RIGHT HIP WITHOUT CONTRAST  TECHNIQUE: Multidetector CT imaging of the right hip was performed according to the standard protocol. Multiplanar CT image reconstructions were also generated.  COMPARISON:  Femur radiograph earlier this day. Pelvis CT 10/05/2014  FINDINGS: The cortical margins of the pelvis and right hip are intact. There is no acute fracture. There is a vascular channel at the lateral aspect of the greater trochanter. Osteoarthritis noted of the right hip with joint space narrowing and osteophytes, similar to prior exam. No evidence of fracture of the included pubic rami are sacrum. Chronic tendinopathy at the  hamstrings insertion. There is no large hip joint effusion. Soft tissue edema appears to be related to anasarca diminished from prior exam. Dense atherosclerotic vascular calcifications are seen.  IMPRESSION: Osteoarthritis of the right hip. No CT findings of acute fracture. MRI could be considered if there is persistent clinical concern for fracture.   Electronically Signed   By: Jeb Levering M.D.   On: 12/04/2014 18:55   Dg Femur, Min 2 Views Right  12/04/2014   CLINICAL DATA:  Initial encounter for fall yesterday. Bruise on right hip.  EXAM: DG FEMUR 2+V*R*  COMPARISON:  None.  FINDINGS: Vascular calcifications. Medial and lateral compartment joint space narrowing. Chondrocalcinosis within the menisci. Artifact degradation superiorly, secondary to primarily clothing. There may be overlying soft tissues as well. Patellofemoral joint space narrowing. No knee joint effusion.  IMPRESSION: No acute osseous abnormality.  Vascular calcifications.  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   Electronically Signed   By: Abigail Miyamoto M.D.   On: 12/04/2014 17:24     EKG Interpretation None      MDM   Final diagnoses:  Fall  Pain  Contusion, hip, right, initial encounter    Patient presenting after a fall at dialysis yesterday and unable to bear weight on his right leg due to hip pain.  Patient states he lost balance and fell. Family states that this is not unusual patient is seeing PT for recurrent falls. He denies syncope or loss of consciousness. No head injury and he does not take anticoagulation. On exam able to range the hip without signs of dislocation and only minimal tenderness with internal and extra normal rotation. However patient unable to bear weight due to pain.  Plain films are negative however concern for occult hip fracture and a CT ordered  CT neg for occult fx.  Will give pain control and  f/u with ortho.   Blanchie Dessert, MD 12/04/14 [id number]

## 2014-12-04 NOTE — ED Notes (Signed)
The pt fell yesterday at dialysis.  He has had rt hip since then.  He has had problems with his balance and he has had bumerus falls

## 2014-12-04 NOTE — ED Notes (Signed)
Demetrios Loll on right leg yesterday when walking with cane yesterday. Able to stand on scene with assitance

## 2014-12-23 ENCOUNTER — Encounter: Payer: Self-pay | Admitting: Physician Assistant

## 2014-12-23 ENCOUNTER — Ambulatory Visit: Payer: Medicare HMO | Attending: Orthopaedic Surgery | Admitting: Physical Therapy

## 2014-12-23 DIAGNOSIS — R269 Unspecified abnormalities of gait and mobility: Secondary | ICD-10-CM | POA: Insufficient documentation

## 2014-12-23 DIAGNOSIS — I951 Orthostatic hypotension: Secondary | ICD-10-CM | POA: Insufficient documentation

## 2014-12-25 DIAGNOSIS — R269 Unspecified abnormalities of gait and mobility: Secondary | ICD-10-CM | POA: Diagnosis present

## 2014-12-25 DIAGNOSIS — I951 Orthostatic hypotension: Secondary | ICD-10-CM | POA: Diagnosis present

## 2014-12-26 ENCOUNTER — Observation Stay (HOSPITAL_COMMUNITY)
Admission: EM | Admit: 2014-12-26 | Discharge: 2014-12-27 | Disposition: A | Discharge status: Home / Self Care | Payer: Medicare HMO | Attending: Internal Medicine | Admitting: Internal Medicine

## 2014-12-26 ENCOUNTER — Emergency Department (HOSPITAL_COMMUNITY): Payer: Medicare HMO

## 2014-12-26 ENCOUNTER — Encounter (HOSPITAL_COMMUNITY): Payer: Self-pay | Admitting: *Deleted

## 2014-12-26 DIAGNOSIS — Z941 Heart transplant status: Secondary | ICD-10-CM | POA: Diagnosis not present

## 2014-12-26 DIAGNOSIS — Z888 Allergy status to other drugs, medicaments and biological substances status: Secondary | ICD-10-CM | POA: Diagnosis not present

## 2014-12-26 DIAGNOSIS — R51 Headache: Secondary | ICD-10-CM | POA: Diagnosis not present

## 2014-12-26 DIAGNOSIS — N184 Chronic kidney disease, stage 4 (severe): Secondary | ICD-10-CM

## 2014-12-26 DIAGNOSIS — I6200 Nontraumatic subdural hemorrhage, unspecified: Secondary | ICD-10-CM | POA: Diagnosis not present

## 2014-12-26 DIAGNOSIS — R4182 Altered mental status, unspecified: Secondary | ICD-10-CM | POA: Diagnosis not present

## 2014-12-26 DIAGNOSIS — I2510 Atherosclerotic heart disease of native coronary artery without angina pectoris: Secondary | ICD-10-CM | POA: Diagnosis not present

## 2014-12-26 DIAGNOSIS — E876 Hypokalemia: Secondary | ICD-10-CM | POA: Diagnosis not present

## 2014-12-26 DIAGNOSIS — Z992 Dependence on renal dialysis: Secondary | ICD-10-CM | POA: Insufficient documentation

## 2014-12-26 DIAGNOSIS — I252 Old myocardial infarction: Secondary | ICD-10-CM | POA: Insufficient documentation

## 2014-12-26 DIAGNOSIS — E119 Type 2 diabetes mellitus without complications: Secondary | ICD-10-CM | POA: Diagnosis not present

## 2014-12-26 DIAGNOSIS — I951 Orthostatic hypotension: Secondary | ICD-10-CM | POA: Insufficient documentation

## 2014-12-26 DIAGNOSIS — E039 Hypothyroidism, unspecified: Secondary | ICD-10-CM | POA: Insufficient documentation

## 2014-12-26 DIAGNOSIS — M25561 Pain in right knee: Secondary | ICD-10-CM

## 2014-12-26 DIAGNOSIS — I169 Hypertensive crisis, unspecified: Secondary | ICD-10-CM

## 2014-12-26 DIAGNOSIS — Z8673 Personal history of transient ischemic attack (TIA), and cerebral infarction without residual deficits: Secondary | ICD-10-CM | POA: Insufficient documentation

## 2014-12-26 DIAGNOSIS — E785 Hyperlipidemia, unspecified: Secondary | ICD-10-CM | POA: Insufficient documentation

## 2014-12-26 DIAGNOSIS — E7800 Pure hypercholesterolemia, unspecified: Secondary | ICD-10-CM | POA: Diagnosis present

## 2014-12-26 DIAGNOSIS — I10 Essential (primary) hypertension: Secondary | ICD-10-CM | POA: Diagnosis present

## 2014-12-26 DIAGNOSIS — S065X9A Traumatic subdural hemorrhage with loss of consciousness of unspecified duration, initial encounter: Secondary | ICD-10-CM

## 2014-12-26 DIAGNOSIS — R7989 Other specified abnormal findings of blood chemistry: Secondary | ICD-10-CM

## 2014-12-26 DIAGNOSIS — N179 Acute kidney failure, unspecified: Secondary | ICD-10-CM

## 2014-12-26 DIAGNOSIS — Z7982 Long term (current) use of aspirin: Secondary | ICD-10-CM | POA: Insufficient documentation

## 2014-12-26 DIAGNOSIS — D649 Anemia, unspecified: Secondary | ICD-10-CM | POA: Insufficient documentation

## 2014-12-26 DIAGNOSIS — N186 End stage renal disease: Secondary | ICD-10-CM | POA: Insufficient documentation

## 2014-12-26 DIAGNOSIS — E038 Other specified hypothyroidism: Secondary | ICD-10-CM

## 2014-12-26 DIAGNOSIS — R778 Other specified abnormalities of plasma proteins: Secondary | ICD-10-CM

## 2014-12-26 DIAGNOSIS — B259 Cytomegaloviral disease, unspecified: Secondary | ICD-10-CM

## 2014-12-26 DIAGNOSIS — R531 Weakness: Secondary | ICD-10-CM

## 2014-12-26 DIAGNOSIS — I120 Hypertensive chronic kidney disease with stage 5 chronic kidney disease or end stage renal disease: Secondary | ICD-10-CM | POA: Diagnosis not present

## 2014-12-26 DIAGNOSIS — N189 Chronic kidney disease, unspecified: Secondary | ICD-10-CM

## 2014-12-26 DIAGNOSIS — J90 Pleural effusion, not elsewhere classified: Secondary | ICD-10-CM

## 2014-12-26 DIAGNOSIS — S065XAA Traumatic subdural hemorrhage with loss of consciousness status unknown, initial encounter: Secondary | ICD-10-CM | POA: Diagnosis present

## 2014-12-26 DIAGNOSIS — R55 Syncope and collapse: Secondary | ICD-10-CM

## 2014-12-26 DIAGNOSIS — Z79899 Other long term (current) drug therapy: Secondary | ICD-10-CM | POA: Diagnosis not present

## 2014-12-26 HISTORY — DX: Unspecified dementia, unspecified severity, without behavioral disturbance, psychotic disturbance, mood disturbance, and anxiety: F03.90

## 2014-12-26 LAB — CBC WITH DIFFERENTIAL/PLATELET
Basophils Absolute: 0 10*3/uL (ref 0.0–0.1)
Basophils Relative: 1 % (ref 0–1)
Eosinophils Absolute: 0 10*3/uL (ref 0.0–0.7)
Eosinophils Relative: 0 % (ref 0–5)
HCT: 31.8 % — ABNORMAL LOW (ref 39.0–52.0)
HEMOGLOBIN: 10 g/dL — AB (ref 13.0–17.0)
LYMPHS PCT: 20 % (ref 12–46)
Lymphs Abs: 1.3 10*3/uL (ref 0.7–4.0)
MCH: 30.7 pg (ref 26.0–34.0)
MCHC: 31.4 g/dL (ref 30.0–36.0)
MCV: 97.5 fL (ref 78.0–100.0)
MONOS PCT: 8 % (ref 3–12)
Monocytes Absolute: 0.5 10*3/uL (ref 0.1–1.0)
NEUTROS PCT: 71 % (ref 43–77)
Neutro Abs: 4.8 10*3/uL (ref 1.7–7.7)
Platelets: 291 10*3/uL (ref 150–400)
RBC: 3.26 MIL/uL — AB (ref 4.22–5.81)
RDW: 15.5 % (ref 11.5–15.5)
WBC: 6.8 10*3/uL (ref 4.0–10.5)

## 2014-12-26 LAB — COMPREHENSIVE METABOLIC PANEL
ALBUMIN: 3.4 g/dL — AB (ref 3.5–5.2)
ALK PHOS: 142 U/L — AB (ref 39–117)
ALT: 12 U/L (ref 0–53)
AST: 19 U/L (ref 0–37)
Anion gap: 7 (ref 5–15)
BILIRUBIN TOTAL: 0.6 mg/dL (ref 0.3–1.2)
BUN: 14 mg/dL (ref 6–23)
CHLORIDE: 96 mmol/L (ref 96–112)
CO2: 35 mmol/L — ABNORMAL HIGH (ref 19–32)
Calcium: 8.6 mg/dL (ref 8.4–10.5)
Creatinine, Ser: 2.03 mg/dL — ABNORMAL HIGH (ref 0.50–1.35)
GFR calc Af Amer: 35 mL/min — ABNORMAL LOW (ref >90)
GFR calc non Af Amer: 31 mL/min — ABNORMAL LOW (ref >90)
GLUCOSE: 155 mg/dL — AB (ref 70–99)
Potassium: 3.1 mmol/L — ABNORMAL LOW (ref 3.5–5.1)
Sodium: 138 mmol/L (ref 135–145)
Total Protein: 6.1 g/dL (ref 6.0–8.3)

## 2014-12-26 LAB — PROTIME-INR
INR: 1.11 (ref 0.00–1.49)
Prothrombin Time: 14.4 seconds (ref 11.6–15.2)

## 2014-12-26 LAB — CBG MONITORING, ED: GLUCOSE-CAPILLARY: 141 mg/dL — AB (ref 70–99)

## 2014-12-26 LAB — APTT: APTT: 39 s — AB (ref 24–37)

## 2014-12-26 LAB — TROPONIN I: Troponin I: 0.04 ng/mL — ABNORMAL HIGH (ref <0.031)

## 2014-12-26 NOTE — ED Provider Notes (Signed)
CSN: [id number]     Arrival date & time 12/26/14  1947 History   First MD Initiated Contact with Patient 12/26/14 2030     Chief Complaint  Patient presents with  . Altered Mental Status    (Consider location/radiation/quality/duration/timing/severity/associated sxs/prior Treatment) HPI Comments: Patient is a 74 year old male with a complicated medical history including CKD, on T/Th/Sa dialysis, s/p heart transplant in 2001 (on antirejection medications), HTN, CVA, CAD, and DM. He presents to the emergency department today for further evaluation of altered mental status. Patient reports that he has been experiencing "episodes" over the course of the day. He states that he woke up feeling confused and it has been hard for him to communicate at times. Most specifically he notes difficulty verbalizing his thoughts and "putting thoughts together". Family most recently noticed this at 48 today. Family reports the episode lasting for approximately 1 hour. They did note some mild slurring. Family reports nonsensical sentences, but also slowing of speech in instances where his sentences are appropriate. Patient reports a mild circumferential headache today. He states that he has had similar headaches since April 2015. He denies the use of blood thinners [place]: He is on an aspirin regimen daily. He does not report any fever, chest pain, shortness of breath, syncope or near-syncope, extremity numbness/weakness, and nausea or vomiting. No reports of facial drooping.  PCP - Dr. Billey Chang Cardiologist - Dr. Stanford Breed Nephrology - Dr. Mercy Moore Transplant Doctor - currently in [facility] as patient relocated in 06/2014; he has an appointment scheduled with [facility] cardiology transplant division this coming Wednesday.  Family reports that patient's history of CVA was noted after frequent episodes of falling. CVA at this time was not noted to be acute. They believe he had a TIA workup performed at this time which was  normal, but cannot definitively confirm this. He did have an echo in 06/2014 which showed moderate LVH and LVEF of 55-60%.  Patient is a 74 y.o. male presenting with altered mental status. The history is provided by the patient. No language interpreter was used.  Altered Mental Status Presenting symptoms: confusion   Associated symptoms: headaches   Associated symptoms: no fever, no light-headedness, no nausea, no vomiting and no weakness     Past Medical History  Diagnosis Date  . CKD III-IV     a. since transplant in 2001 with significant worsening since 06/2013.  Marland Kitchen Hypertension     a. Long h/o HTN, came off of all meds following transplant/wt loss-->meds resumed 06/2013, difficult to control since.  Marland Kitchen History of stroke   . Hypercalcemia     a. 06/2013 Hospitalized in [place].  Marland Kitchen Heart transplanted     a. 1995 CABG x 4 in [place], [place];  b. 2001 Developed CHF;  c. 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 10/2000 Cardiac transplant @ [facility], [place];  e. Historically nl Bx and stress testing.  Last stress test ~ 5 yrs ago.  Marland Kitchen Hernia of abdominal wall   . Hyperlipidemia   . Orthostatic hypotension     a. Has tried support hose and compression stockings - prefers not to wear.  . Coronary artery disease   . Myocardial infarction     PRIOR TO HEART TRANSPLANT  . Shortness of breath     EXERTION  . Orthostatic dizziness   . Stroke 06/2013    NO RESIDUAL - WAS CONFUSED AT TIME - THAT HAS RESOLVED  . Diabetes mellitus without complication     FASTING CBG 90S  .  Headache(784.0)   . Cancer     SKIN  . Anemia   . History of blood transfusion    Past Surgical History  Procedure Laterality Date  . Cardiac surgery    . Heart transplant    . Cataract extraction    . Vasectomy    . Skin graft full thickness leg      cat scratch that did not heal  . Coronary artery bypass graft    . Eye surgery Bilateral     CATARACTS  . Bascilic vein transposition Left 07/06/2014    Procedure: [place];  Surgeon: Rosetta Posner, MD;  Location: [facility] OR;  Service: Vascular;  Laterality: Left;   Family History  Problem Relation Age of Onset  . Heart attack Father     died @ 20  . Heart attack Mother    History  Substance Use Topics  . Smoking status: Never Smoker   . Smokeless tobacco: Never Used  . Alcohol Use: No    Review of Systems  Constitutional: Negative for fever.  Respiratory: Negative for shortness of breath.   Cardiovascular: Negative for chest pain.  Gastrointestinal: Negative for nausea and vomiting.  Neurological: Positive for speech difficulty and headaches. Negative for syncope, facial asymmetry, weakness, light-headedness and numbness.  Psychiatric/Behavioral: Positive for confusion.  All other systems reviewed and are negative.   Allergies  Norvasc  Home Medications   Prior to Admission medications   Medication Sig Start Date End Date Taking? Authorizing Provider  acetaminophen (TYLENOL) 325 MG tablet Take 2 tablets (650 mg total) by mouth every 6 (six) hours as needed for mild pain (or Fever >/= 101). 10/14/14  Yes Delfina Redwood, MD  aspirin EC 325 MG tablet Take 325 mg by mouth at bedtime.   Yes Historical Provider, MD  cholecalciferol (VITAMIN D) 1000 UNITS tablet Take 1,000 Units by mouth daily.   Yes Historical Provider, MD  cycloSPORINE modified (NEORAL) 25 MG capsule Take 50 mg by mouth 2 (two) times daily.  08/18/14  Yes Lelon Perla, MD  ferrous sulfate 325 (65 FE) MG tablet Take 325 mg by mouth 3 (three) times daily with meals.   Yes Historical Provider, MD  FLUoxetine (PROZAC) 20 MG capsule Take 20 mg by mouth daily with breakfast.   Yes Historical Provider, MD  lansoprazole (PREVACID) 15 MG capsule Take 30 mg by mouth 2 (two) times daily.    Yes Historical Provider, MD  levothyroxine (SYNTHROID, LEVOTHROID) 75 MCG tablet Take 75 mcg by mouth daily before breakfast.   Yes Historical Provider, MD  metoprolol tartrate (LOPRESSOR) 25  MG tablet Take 1.5 tablets (37.5 mg total) by mouth 2 (two) times daily. 10/14/14  Yes Delfina Redwood, MD  mycophenolate (CELLCEPT) 250 MG capsule Take 250 mg by mouth 2 (two) times daily.    Yes Historical Provider, MD  pravastatin (PRAVACHOL) 10 MG tablet Take 10 mg by mouth daily with breakfast.   Yes Historical Provider, MD  tamsulosin (FLOMAX) 0.4 MG CAPS capsule Take 0.4 mg by mouth daily after supper.   Yes Historical Provider, MD  vitamin B-12 (CYANOCOBALAMIN) 1000 MCG tablet Take 1,000 mcg by mouth daily with breakfast.   Yes Historical Provider, MD  HYDROcodone-acetaminophen (NORCO/VICODIN) 5-325 MG per tablet Take 1-2 tablets by mouth every 6 (six) hours as needed for moderate pain or severe pain. 12/04/14   Blanchie Dessert, MD  senna-docusate (SENOKOT-S) 8.6-50 MG per tablet Take 1 tablet by mouth daily. 10/14/14  Delfina Redwood, MD   BP 177/63 mmHg  Pulse 79  Temp(Src) 98.6 F (37 C) (Oral)  Resp 21  SpO2 99%   Physical Exam  Constitutional: He is oriented to person, place, and time. He appears well-developed and well-nourished. No distress.  Nontoxic/nonseptic. Patient is chronically thin appearing and pleasant  HENT:  Head: Normocephalic and atraumatic.  Mouth/Throat: Oropharynx is clear and moist. No oropharyngeal exudate.  Eyes: Conjunctivae and EOM are normal. Pupils are equal, round, and reactive to light. No scleral icterus.  Neck: Normal range of motion.  Cardiovascular: Normal rate, regular rhythm and intact distal pulses.   Heart regular rate and rhythm. Left carotid bruit noted. No JVD. There is a palpable thrill over patient's LUE fistula.  Pulmonary/Chest: Effort normal and breath sounds normal. No respiratory distress. He has no wheezes. He has no rales.  Respirations even and unlabored  Abdominal: Soft. He exhibits no distension. There is no tenderness.  Soft, nontender.  Musculoskeletal: Normal range of motion.  Neurological: He is alert and  oriented to person, place, and time. He has normal reflexes. No cranial nerve deficit. He exhibits normal muscle tone. Coordination normal.  GCS 15. Speech is goal oriented. Patient answers questions appropriately and follows simple commands. No cranial nerve deficits appreciated; symmetric eyebrow raise, no facial drooping, tongue midline. Patient has equal grip strength bilaterally and 5/5 strength against resistance in all major muscle groups bilaterally. Patient moves extremities without ataxia. No pronator drift noted. Sensation to light touch intact. DTRs normal and symmetric.  Skin: Skin is warm and dry. No rash noted. He is not diaphoretic. No erythema. No pallor.  Psychiatric: He has a normal mood and affect. His behavior is normal.  Nursing note and vitals reviewed.   ED Course  Procedures (including critical care time) Labs Review Labs Reviewed  CBC WITH DIFFERENTIAL/PLATELET - Abnormal; Notable for the following:    RBC 3.26 (*)    Hemoglobin 10.0 (*)    HCT 31.8 (*)    All other components within normal limits  COMPREHENSIVE METABOLIC PANEL - Abnormal; Notable for the following:    Potassium 3.1 (*)    CO2 35 (*)    Glucose, Bld 155 (*)    Creatinine, Ser 2.03 (*)    Albumin 3.4 (*)    Alkaline Phosphatase 142 (*)    GFR calc non Af Amer 31 (*)    GFR calc Af Amer 35 (*)    All other components within normal limits  APTT - Abnormal; Notable for the following:    aPTT 39 (*)    All other components within normal limits  TROPONIN I - Abnormal; Notable for the following:    Troponin I 0.04 (*)    All other components within normal limits  CBG MONITORING, ED - Abnormal; Notable for the following:    Glucose-Capillary 141 (*)    All other components within normal limits  PROTIME-INR    Imaging Review Ct Head Wo Contrast  12/26/2014   CLINICAL DATA:  Acute altered mental status.  EXAM: CT HEAD WITHOUT CONTRAST  TECHNIQUE: Contiguous axial images were obtained from the  base of the skull through the vertex without intravenous contrast.  COMPARISON:  08/25/2014.  FINDINGS: There is a complex extra-axial fluid collection on the left side which is likely a mixed subdural hematoma with old and new blood. There is mass effect on the upper left cerebrum with compression of the sulci. There is also mild mass effect on the  left lateral ventricle and a maximum of 4 mm of left-to-right shift. No findings for hemispheric infarction or parenchymal hemorrhage. The brainstem and cerebellum are grossly normal. Stable dural calcifications.  No skull fracture. No bone lesions. The paranasal sinuses and mastoid air cells are clear. The globes are intact.  IMPRESSION: 1. 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 Compression of the left high parietal sulci and 4 mm of left-to-right shift. 2. No hemispheric infarction or intraparenchymal hemorrhage.   Electronically Signed   By: Kalman Jewels M.D.   On: 12/26/2014 21:51     EKG Interpretation   Date/Time:  Saturday December 26 2014 20:21:01 EST Ventricular Rate:  80 PR Interval:  116 QRS Duration: 115 QT Interval:  452 QTC Calculation: 521 R Axis:   91 Text Interpretation:  Sinus rhythm Borderline short PR interval Incomplete  right bundle branch block agree. inferior t wave inversion compared with  old. Confirmed by Johnney Killian, MD, Jeannie Done [phone]) on 12/26/2014 8:33:30 PM      MDM   Final diagnoses:  Subdural hematoma  Altered mental status, unspecified altered mental status type    74 year old male with a comp located past medical history presents to the emergency department for further evaluation of altered mental status. Patient states that he had difficulty verbalizing his thoughts and putting his thoughts together into a sentence over the course of the day today. The symptoms have been intermittent, but worsened after his dialysis.  Patient had a nonfocal neurologic exam today. CT scan was  ordered which shows a mixed subdural hematoma with 4 mm of left to right shift. Dr. Ellene Route neurosurgery was consulted who recommends inpatient monitoring. He will see the patient in the morning under consultation and recommends admission to the hospitalist service. Dr. Maudie Mercury of [facility] to admit. It was noted that the patient had a very mildly elevated troponin today to 0.04. Suspect this may be secondary to operator error or the patient's history of CKD and dialysis. Patient with no c/o CP or SOB. No acute ischemic changes on EKG.   Filed Vitals:   12/26/14 2029 12/26/14 2103 12/26/14 2208  BP: 168/69 177/63 191/73  Pulse: 80 79 79  Temp: 98.6 F (37 C)    TempSrc: Oral    Resp: 20 21 24   SpO2: 99% 99% 100%     Antonietta Breach, PA-C 12/26/14 9432  Charlesetta Shanks, MD 12/26/14 2354

## 2014-12-26 NOTE — ED Notes (Signed)
The pt was last ok last pm.  When he woke  Up this am he was confused.  He went to dialysis was picked up at 1700  He has had 2 episodes of speech difficulty.  None now.  His wife feels like he is still confused.  No pain anywhere.  His speech is clear now.    Dialysis fistula  Lt arm

## 2014-12-26 NOTE — H&P (Signed)
Gilliam Hawkes is an 74 y.o. male.    Dr. Jonni Sanger (pcp) Fleet Contras (nephrologist)  Chief Complaint: altered ms HPI: 74 yo male with mild dementia, ESRD, CAD s/p CABG apparently c/o ams starting this am, pt unable to get out what he wanted to, and express himself which was unusual per his wife. Pt has been falling since April.  Last was about 2 weeks ago.  Pt was brought to ED for evaluation and found to have subdural hematoma.  Neurosurgery recommended medical admission.   Past Medical History  Diagnosis Date  . CKD III-IV     a. since transplant in 2001 with significant worsening since 06/2013.  Marland Kitchen Hypertension     a. Long h/o HTN, came off of all meds following transplant/wt loss-->meds resumed 06/2013, difficult to control since.  Marland Kitchen History of stroke   . Hypercalcemia     a. 06/2013 Hospitalized in [place].  Marland Kitchen Heart transplanted     a. 1995 CABG x 4 in [place], [place];  b. 2001 Developed CHF;  c. 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 10/2000 Cardiac transplant @ [facility], [place];  e. Historically nl Bx and stress testing.  Last stress test ~ 5 yrs ago.  Marland Kitchen Hernia of abdominal wall   . Hyperlipidemia   . Orthostatic hypotension     a. Has tried support hose and compression stockings - prefers not to wear.  . Coronary artery disease   . Myocardial infarction     PRIOR TO HEART TRANSPLANT  . Shortness of breath     EXERTION  . Orthostatic dizziness   . Stroke 06/2013    NO RESIDUAL - WAS CONFUSED AT TIME - THAT HAS RESOLVED  . Diabetes mellitus without complication     FASTING CBG 90S  . Headache(784.0)   . Cancer     SKIN  . Anemia   . History of blood transfusion   . Dementia     mild    Past Surgical History  Procedure Laterality Date  . Cardiac surgery    . Heart transplant    . Cataract extraction    . Vasectomy    . Skin graft full thickness leg      cat scratch that did not heal  . Coronary artery bypass graft    . Eye surgery Bilateral     CATARACTS  . Bascilic  vein transposition Left 07/06/2014    Procedure: [place];  Surgeon: Rosetta Posner, MD;  Location: [facility] OR;  Service: Vascular;  Laterality: Left;    Family History  Problem Relation Age of Onset  . Heart attack Father     died @ 61  . Heart attack Mother    Social History:  reports that he has never smoked. He has never used smokeless tobacco. He reports that he does not drink alcohol or use illicit drugs.  Allergies:  Allergies  Allergen Reactions  . Norvasc [Amlodipine Besylate] Swelling and Other (See Comments)    Swelling of the lower body    Medications reviewed  (Not in a hospital admission)  Results for orders placed or performed during the hospital encounter of 12/26/14 (from the past 48 hour(s))  CBG monitoring, ED     Status: Abnormal   Collection Time: 12/26/14  8:05 PM  Result Value Ref Range   Glucose-Capillary 141 (H) 70 - 99 mg/dL   Comment 1 Notify RN   CBC with Differential     Status: Abnormal   Collection  Time: 12/26/14  8:06 PM  Result Value Ref Range   WBC 6.8 4.0 - 10.5 K/uL   RBC 3.26 (L) 4.22 - 5.81 MIL/uL   Hemoglobin 10.0 (L) 13.0 - 17.0 g/dL   HCT 31.8 (L) 39.0 - 52.0 %   MCV 97.5 78.0 - 100.0 fL   MCH 30.7 26.0 - 34.0 pg   MCHC 31.4 30.0 - 36.0 g/dL   RDW 15.5 11.5 - 15.5 %   Platelets 291 150 - 400 K/uL   Neutrophils Relative % 71 43 - 77 %   Neutro Abs 4.8 1.7 - 7.7 K/uL   Lymphocytes Relative 20 12 - 46 %   Lymphs Abs 1.3 0.7 - 4.0 K/uL   Monocytes Relative 8 3 - 12 %   Monocytes Absolute 0.5 0.1 - 1.0 K/uL   Eosinophils Relative 0 0 - 5 %   Eosinophils Absolute 0.0 0.0 - 0.7 K/uL   Basophils Relative 1 0 - 1 %   Basophils Absolute 0.0 0.0 - 0.1 K/uL  Comprehensive metabolic panel     Status: Abnormal   Collection Time: 12/26/14  8:06 PM  Result Value Ref Range   Sodium 138 135 - 145 mmol/L   Potassium 3.1 (L) 3.5 - 5.1 mmol/L   Chloride 96 96 - 112 mmol/L   CO2 35 (H) 19 - 32 mmol/L   Glucose, Bld 155 (H) 70 - 99  mg/dL   BUN 14 6 - 23 mg/dL   Creatinine, Ser 2.03 (H) 0.50 - 1.35 mg/dL   Calcium 8.6 8.4 - 10.5 mg/dL   Total Protein 6.1 6.0 - 8.3 g/dL   Albumin 3.4 (L) 3.5 - 5.2 g/dL   AST 19 0 - 37 U/L   ALT 12 0 - 53 U/L   Alkaline Phosphatase 142 (H) 39 - 117 U/L   Total Bilirubin 0.6 0.3 - 1.2 mg/dL   GFR calc non Af Amer 31 (L) >90 mL/min   GFR calc Af Amer 35 (L) >90 mL/min    Comment: (NOTE) The eGFR has been calculated using the CKD EPI equation. This calculation has not been validated in all clinical situations. eGFR's persistently <90 mL/min signify possible Chronic Kidney Disease.    Anion gap 7 5 - 15  APTT     Status: Abnormal   Collection Time: 12/26/14  8:06 PM  Result Value Ref Range   aPTT 39 (H) 24 - 37 seconds    Comment:        IF BASELINE aPTT IS ELEVATED, SUGGEST PATIENT RISK ASSESSMENT BE USED TO DETERMINE APPROPRIATE ANTICOAGULANT THERAPY.   Troponin I     Status: Abnormal   Collection Time: 12/26/14  8:06 PM  Result Value Ref Range   Troponin I 0.04 (H) <0.031 ng/mL    Comment:        PERSISTENTLY INCREASED TROPONIN VALUES IN THE RANGE OF 0.04-0.49 ng/mL CAN BE SEEN IN:       -UNSTABLE ANGINA       -CONGESTIVE HEART FAILURE       -MYOCARDITIS       -CHEST TRAUMA       -ARRYHTHMIAS       -LATE PRESENTING MYOCARDIAL INFARCTION       -COPD   CLINICAL FOLLOW-UP RECOMMENDED.   Protime-INR     Status: None   Collection Time: 12/26/14  8:06 PM  Result Value Ref Range   Prothrombin Time 14.4 11.6 - 15.2 seconds   INR 1.11 0.00 - 1.49  Ct Head Wo Contrast  12/26/2014   CLINICAL DATA:  Acute altered mental status.  EXAM: CT HEAD WITHOUT CONTRAST  TECHNIQUE: Contiguous axial images were obtained from the base of the skull through the vertex without intravenous contrast.  COMPARISON:  08/25/2014.  FINDINGS: There is a complex extra-axial fluid collection on the left side which is likely a mixed subdural hematoma with old and new blood. There is mass effect on  the upper left cerebrum with compression of the sulci. There is also mild mass effect on the left lateral ventricle and a maximum of 4 mm of left-to-right shift. No findings for hemispheric infarction or parenchymal hemorrhage. The brainstem and cerebellum are grossly normal. Stable dural calcifications.  No skull fracture. No bone lesions. The paranasal sinuses and mastoid air cells are clear. The globes are intact.  IMPRESSION: 1. 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 Compression of the left high parietal sulci and 4 mm of left-to-right shift. 2. No hemispheric infarction or intraparenchymal hemorrhage.   Electronically Signed   By: Kalman Jewels M.D.   On: 12/26/2014 21:51    Review of Systems  Constitutional: Negative for fever, chills, weight loss, malaise/fatigue and diaphoresis.  HENT: Negative for congestion, ear discharge, ear pain, hearing loss, nosebleeds, sore throat and tinnitus.   Eyes: Negative for blurred vision, double vision, photophobia, pain, discharge and redness.  Respiratory: Negative for cough, hemoptysis, sputum production, shortness of breath, wheezing and stridor.   Cardiovascular: Negative for chest pain, palpitations, orthopnea, claudication, leg swelling and PND.  Gastrointestinal: Negative for heartburn, nausea, vomiting, abdominal pain, diarrhea, constipation, blood in stool and melena.  Genitourinary: Negative for dysuria, urgency, frequency, hematuria and flank pain.  Musculoskeletal: Negative for myalgias, back pain, joint pain, falls and neck pain.  Skin: Negative for itching and rash.  Neurological: Negative for dizziness, tingling, tremors, sensory change, speech change, focal weakness, seizures, loss of consciousness, weakness and headaches.  Endo/Heme/Allergies: Negative for environmental allergies and polydipsia. Does not bruise/bleed easily.  Psychiatric/Behavioral: Negative for depression, suicidal ideas, hallucinations,  memory loss and substance abuse. The patient is not nervous/anxious and does not have insomnia.     Blood pressure 191/73, pulse 79, temperature 98.6 F (37 C), temperature source Oral, resp. rate 24, SpO2 100 %. Physical Exam  Constitutional: He is oriented to person, place, and time. He appears well-developed and well-nourished.  HENT:  Head: Normocephalic and atraumatic.  Mouth/Throat: No oropharyngeal exudate.  Eyes: Conjunctivae and EOM are normal. Pupils are equal, round, and reactive to light. No scleral icterus.  Neck: Normal range of motion. Neck supple. No JVD present. No tracheal deviation present. No thyromegaly present.  Cardiovascular: Normal rate and regular rhythm.  Exam reveals no gallop and no friction rub.   No murmur heard. Respiratory: Effort normal and breath sounds normal. No respiratory distress. He has no wheezes. He has no rales.  GI: Soft. Bowel sounds are normal. He exhibits no distension. There is no tenderness. There is no rebound and no guarding.  Musculoskeletal: Normal range of motion. He exhibits no edema or tenderness.  Lymphadenopathy:    He has no cervical adenopathy.  Neurological: He is alert and oriented to person, place, and time. He has normal reflexes. He displays normal reflexes. No cranial nerve deficit. He exhibits normal muscle tone. Coordination normal.  Skin: Skin is warm and dry. No rash noted. No erythema. No pallor.  Psychiatric: He has a normal mood and affect. His behavior is normal.  Judgment and thought content normal.     Assessment/Plan AMS secondary to subdural hematoma Subdural hematoma Appreciate neurosurgery input NPO for now except for medication.  Check CT scan in am  ESRD T, T, S Please consult nephrology in am  Anemia Check cbc in am  Dm2 fsbs ac and qhs, iss   Hypokalemia Replete with kcl 20 meq po qday x 1 days Check cmp in am  Jani Gravel 12/26/2014, 11:53 PM

## 2014-12-27 ENCOUNTER — Inpatient Hospital Stay (HOSPITAL_COMMUNITY): Payer: Medicare HMO

## 2014-12-27 DIAGNOSIS — E119 Type 2 diabetes mellitus without complications: Secondary | ICD-10-CM

## 2014-12-27 DIAGNOSIS — I6200 Nontraumatic subdural hemorrhage, unspecified: Secondary | ICD-10-CM | POA: Diagnosis not present

## 2014-12-27 DIAGNOSIS — E780 Pure hypercholesterolemia: Secondary | ICD-10-CM | POA: Diagnosis not present

## 2014-12-27 DIAGNOSIS — Z992 Dependence on renal dialysis: Secondary | ICD-10-CM

## 2014-12-27 DIAGNOSIS — N186 End stage renal disease: Secondary | ICD-10-CM | POA: Diagnosis not present

## 2014-12-27 LAB — CBC WITH DIFFERENTIAL/PLATELET
Basophils Absolute: 0 10*3/uL (ref 0.0–0.1)
Basophils Relative: 1 % (ref 0–1)
Eosinophils Absolute: 0 10*3/uL (ref 0.0–0.7)
Eosinophils Relative: 1 % (ref 0–5)
HCT: 32 % — ABNORMAL LOW (ref 39.0–52.0)
Hemoglobin: 9.9 g/dL — ABNORMAL LOW (ref 13.0–17.0)
LYMPHS PCT: 31 % (ref 12–46)
Lymphs Abs: 1.8 10*3/uL (ref 0.7–4.0)
MCH: 30.4 pg (ref 26.0–34.0)
MCHC: 30.9 g/dL (ref 30.0–36.0)
MCV: 98.2 fL (ref 78.0–100.0)
MONO ABS: 0.6 10*3/uL (ref 0.1–1.0)
Monocytes Relative: 10 % (ref 3–12)
NEUTROS PCT: 57 % (ref 43–77)
Neutro Abs: 3.5 10*3/uL (ref 1.7–7.7)
PLATELETS: 281 10*3/uL (ref 150–400)
RBC: 3.26 MIL/uL — AB (ref 4.22–5.81)
RDW: 15.4 % (ref 11.5–15.5)
WBC: 5.9 10*3/uL (ref 4.0–10.5)

## 2014-12-27 LAB — COMPREHENSIVE METABOLIC PANEL
ALK PHOS: 143 U/L — AB (ref 39–117)
ALT: 12 U/L (ref 0–53)
ANION GAP: 4 — AB (ref 5–15)
AST: 14 U/L (ref 0–37)
Albumin: 3.2 g/dL — ABNORMAL LOW (ref 3.5–5.2)
BUN: 22 mg/dL (ref 6–23)
CALCIUM: 8.6 mg/dL (ref 8.4–10.5)
CO2: 38 mmol/L — AB (ref 19–32)
Chloride: 96 mmol/L (ref 96–112)
Creatinine, Ser: 2.71 mg/dL — ABNORMAL HIGH (ref 0.50–1.35)
GFR, EST AFRICAN AMERICAN: 25 mL/min — AB (ref >90)
GFR, EST NON AFRICAN AMERICAN: 22 mL/min — AB (ref >90)
GLUCOSE: 97 mg/dL (ref 70–99)
POTASSIUM: 3 mmol/L — AB (ref 3.5–5.1)
SODIUM: 138 mmol/L (ref 135–145)
TOTAL PROTEIN: 6.2 g/dL (ref 6.0–8.3)
Total Bilirubin: 0.6 mg/dL (ref 0.3–1.2)

## 2014-12-27 MED ORDER — HYDRALAZINE HCL 20 MG/ML IJ SOLN
10.0000 mg | Freq: Four times a day (QID) | INTRAMUSCULAR | Status: DC | PRN
  Administered 2014-12-27 (×2): 10 mg via INTRAVENOUS
  Filled 2014-12-27 (×2): qty 1

## 2014-12-27 MED ORDER — FLUOXETINE HCL 20 MG PO CAPS
20.0000 mg | ORAL_CAPSULE | Freq: Every day | ORAL | Status: DC
  Administered 2014-12-27: 20 mg via ORAL
  Filled 2014-12-27 (×2): qty 1

## 2014-12-27 MED ORDER — FERROUS SULFATE 325 (65 FE) MG PO TABS
325.0000 mg | ORAL_TABLET | Freq: Three times a day (TID) | ORAL | Status: DC
  Administered 2014-12-27 (×2): 325 mg via ORAL
  Filled 2014-12-27 (×4): qty 1

## 2014-12-27 MED ORDER — POTASSIUM CHLORIDE 10 MEQ/100ML IV SOLN
10.0000 meq | INTRAVENOUS | Status: DC
  Administered 2014-12-27: 10 meq via INTRAVENOUS
  Filled 2014-12-27: qty 100

## 2014-12-27 MED ORDER — PANTOPRAZOLE SODIUM 20 MG PO TBEC
20.0000 mg | DELAYED_RELEASE_TABLET | Freq: Every day | ORAL | Status: DC
  Administered 2014-12-27: 20 mg via ORAL
  Filled 2014-12-27: qty 1

## 2014-12-27 MED ORDER — VITAMIN B-12 1000 MCG PO TABS
1000.0000 ug | ORAL_TABLET | Freq: Every day | ORAL | Status: DC
  Administered 2014-12-27: 1000 ug via ORAL
  Filled 2014-12-27 (×2): qty 1

## 2014-12-27 MED ORDER — SODIUM CHLORIDE 0.9 % IV SOLN: 250.0000 mL | INTRAVENOUS | Status: DC | PRN

## 2014-12-27 MED ORDER — VITAMIN D 1000 UNITS PO TABS
1000.0000 [IU] | ORAL_TABLET | Freq: Every day | ORAL | Status: DC
  Administered 2014-12-27: 1000 [IU] via ORAL
  Filled 2014-12-27: qty 1

## 2014-12-27 MED ORDER — METOPROLOL TARTRATE 25 MG PO TABS: 50.0000 mg | ORAL_TABLET | Freq: Two times a day (BID) | ORAL | Status: AC

## 2014-12-27 MED ORDER — LEVOTHYROXINE SODIUM 50 MCG PO TABS
75.0000 ug | ORAL_TABLET | Freq: Every day | ORAL | Status: DC
  Administered 2014-12-27: 75 ug via ORAL
  Filled 2014-12-27 (×2): qty 1

## 2014-12-27 MED ORDER — PRAVASTATIN SODIUM 20 MG PO TABS
10.0000 mg | ORAL_TABLET | Freq: Every day | ORAL | Status: DC
  Administered 2014-12-27: 10 mg via ORAL
  Filled 2014-12-27 (×2): qty 1

## 2014-12-27 MED ORDER — TAMSULOSIN HCL 0.4 MG PO CAPS: 0.4000 mg | ORAL_CAPSULE | Freq: Every day | ORAL | Status: DC

## 2014-12-27 MED ORDER — ISOSORBIDE MONONITRATE ER 30 MG PO TB24
30.0000 mg | ORAL_TABLET | Freq: Every day | ORAL | Status: DC
  Filled 2014-12-27: qty 1

## 2014-12-27 MED ORDER — METOPROLOL TARTRATE 25 MG PO TABS: 37.5000 mg | ORAL_TABLET | Freq: Two times a day (BID) | ORAL | Status: DC

## 2014-12-27 MED ORDER — MYCOPHENOLATE MOFETIL 250 MG PO CAPS
250.0000 mg | ORAL_CAPSULE | Freq: Two times a day (BID) | ORAL | Status: DC
  Administered 2014-12-27: 250 mg via ORAL
  Filled 2014-12-27 (×3): qty 1

## 2014-12-27 MED ORDER — POTASSIUM CHLORIDE 10 MEQ/100ML IV SOLN
10.0000 meq | INTRAVENOUS | Status: AC
  Administered 2014-12-27 (×2): 10 meq via INTRAVENOUS
  Filled 2014-12-27 (×2): qty 100

## 2014-12-27 MED ORDER — METOPROLOL TARTRATE 50 MG PO TABS
50.0000 mg | ORAL_TABLET | Freq: Two times a day (BID) | ORAL | Status: DC
  Administered 2014-12-27: 50 mg via ORAL
  Filled 2014-12-27: qty 2

## 2014-12-27 MED ORDER — SODIUM CHLORIDE 0.9 % IJ SOLN
3.0000 mL | INTRAMUSCULAR | Status: DC | PRN
Start: 2014-12-27 — End: 2014-12-27

## 2014-12-27 MED ORDER — POTASSIUM CHLORIDE CRYS ER 20 MEQ PO TBCR
20.0000 meq | EXTENDED_RELEASE_TABLET | Freq: Once | ORAL | Status: AC
  Administered 2014-12-27: 20 meq via ORAL
  Filled 2014-12-27: qty 1

## 2014-12-27 MED ORDER — ACETAMINOPHEN 325 MG PO TABS
650.0000 mg | ORAL_TABLET | Freq: Four times a day (QID) | ORAL | Status: DC | PRN
  Administered 2014-12-27: 650 mg via ORAL
  Filled 2014-12-27: qty 2

## 2014-12-27 MED ORDER — CYCLOSPORINE MODIFIED (NEORAL) 25 MG PO CAPS
50.0000 mg | ORAL_CAPSULE | Freq: Two times a day (BID) | ORAL | Status: DC
  Administered 2014-12-27: 50 mg via ORAL
  Filled 2014-12-27 (×2): qty 2

## 2014-12-27 MED ORDER — ASPIRIN EC 325 MG PO TBEC: 325.0000 mg | DELAYED_RELEASE_TABLET | Freq: Every day | ORAL | Status: DC

## 2014-12-27 MED ORDER — SODIUM CHLORIDE 0.9 % IJ SOLN
3.0000 mL | Freq: Two times a day (BID) | INTRAMUSCULAR | Status: DC
  Administered 2014-12-27: 3 mL via INTRAVENOUS

## 2014-12-27 NOTE — Progress Notes (Signed)
Patient's wife refused the prescription for lopressor, said the pt still has some.

## 2014-12-27 NOTE — Progress Notes (Signed)
Patient is being d/c home. D/c instruction given. Patient verbalized understanding. Condition stable.

## 2014-12-27 NOTE — Progress Notes (Signed)
Physical Therapy Evaluation Patient Details Name: [name] [name] MRN: [id number] DOB: 1941/03/01 Today's Date: 12/27/2014   History of Present Illness  Patient is a 74 yo male admitted 12/26/14 with AMS and headache.  Patient has had multiple falls.  Patient with SDH.  PMH:  ESRD on HD, mild dementia, CAD, CABG  Clinical Impression  Patient presents with problems listed below.  Patient is min assist with mobility and gait.  For d/c home today.  Patient is already receiving OP PT from [place] at [facility] - will continue this at d/c.    Follow Up Recommendations Outpatient PT;Supervision/Assistance - 24 hour    Equipment Recommendations  None recommended by PT    Recommendations for Other Services       Precautions / Restrictions Precautions Precautions: Fall Precaution Comments: History of multiple falls Restrictions Weight Bearing Restrictions: No      Mobility  Bed Mobility Overal bed mobility: Modified Independent             General bed mobility comments: Use of rail  Transfers Overall transfer level: Needs assistance Equipment used: Rolling walker (2 wheeled) Transfers: Sit to/from Stand Sit to Stand: Min assist         General transfer comment: Verbal cues for hand placement.  Assist to rise to standing and for balance initially  Ambulation/Gait Ambulation/Gait assistance: Min guard Ambulation Distance (Feet): 120 Feet Assistive device: Rolling walker (2 wheeled) Gait Pattern/deviations: Step-through pattern;Decreased stride length;Shuffle;Trunk flexed Gait velocity: Decreased Gait velocity interpretation: Below normal speed for age/gender General Gait Details: Verbal cues to stand upright - flexed posture.  Stairs            Wheelchair Mobility    Modified Rankin (Stroke Patients Only)       Balance Overall balance assessment: Needs assistance         Standing balance support: Bilateral upper extremity supported Standing  balance-Leahy Scale: Poor                               Pertinent Vitals/Pain Pain Assessment: 0-10 Pain Score: 3  Pain Location: Lt hip from fall Pain Descriptors / Indicators: Aching;Sore Pain Intervention(s): Limited activity within patient's tolerance;Repositioned    Home Living Family/patient expects to be discharged to:: Private residence Living Arrangements: Spouse/significant other Available Help at Discharge: Family;Available 24 hours/day (Wife and son) Type of Home: Apartment Home Access: Stairs to enter Entrance Stairs-Rails: Right Entrance Stairs-Number of Steps: 3 wide steps Home Layout: One level Home Equipment: Walker - 4 wheels;Cane - single point;Bedside commode;Shower seat;Grab bars - tub/shower;Wheelchair - manual      Prior Function Level of Independence: Independent with assistive device(s);Needs assistance (Prior to last fall)   Gait / Transfers Assistance Needed: Pt states he walks with a cane or walker depending on the day and how he feels. States he is limited with distance.   ADL's / Homemaking Assistance Needed: For last 2 weeks, Wife assists with bath every other day and dressing. Also assists with shoes/socks; before that pt able to dress and bathe himself        Hand Dominance   Dominant Hand: Right    Extremity/Trunk Assessment   Upper Extremity Assessment: Generalized weakness           Lower Extremity Assessment: Generalized weakness      Cervical / Trunk Assessment: Kyphotic  Communication   Communication: HOH  Cognition Arousal/Alertness: Awake/alert Behavior During Therapy:  WFL for tasks assessed/performed Overall Cognitive Status: History of cognitive impairments - at baseline                      General Comments      Exercises        Assessment/Plan    PT Assessment Patient needs continued PT services;All further PT needs can be met in the next venue of care  PT Diagnosis Difficulty  walking;Abnormality of gait;Generalized weakness;Acute pain   PT Problem List Decreased strength;Decreased activity tolerance;Decreased balance;Decreased mobility;Decreased knowledge of use of DME;Pain  PT Treatment Interventions     PT Goals (Current goals can be found in the Care Plan section)      Frequency Other (Comment) (f/u OP PT)   Barriers to discharge        Co-evaluation               End of Session Equipment Utilized During Treatment: Gait belt Activity Tolerance: Patient limited by fatigue Patient left: in bed;with call bell/phone within reach;with family/visitor present Nurse Communication: Mobility status (Ready for d/c - has OP PT already in progress)    Functional Assessment Tool Used: Clinical judgement Functional Limitation: Mobility: Walking and moving around Mobility: Walking and Moving Around Current Status (S2831): At least 1 percent but less than 20 percent impaired, limited or restricted Mobility: Walking and Moving Around Goal Status [phone]): At least 1 percent but less than 20 percent impaired, limited or restricted Mobility: Walking and Moving Around Discharge Status [phone]): At least 1 percent but less than 20 percent impaired, limited or restricted    Time: 1323-1336 PT Time Calculation (min) (ACUTE ONLY): 13 min   Charges:   PT Evaluation $Initial PT Evaluation Tier I: 1 Procedure     PT G Codes:   PT G-Codes **NOT FOR INPATIENT CLASS** Functional Assessment Tool Used: Clinical judgement Functional Limitation: Mobility: Walking and moving around Mobility: Walking and Moving Around Current Status (T0626): At least 1 percent but less than 20 percent impaired, limited or restricted Mobility: Walking and Moving Around Goal Status [phone]): At least 1 percent but less than 20 percent impaired, limited or restricted Mobility: Walking and Moving Around Discharge Status [phone]): At least 1 percent but less than 20 percent impaired, limited or  restricted    Despina Pole 12/27/2014, 1:47 PM Carita Pian. Sanjuana Kava, [place] Pager [phone]

## 2014-12-27 NOTE — Progress Notes (Signed)
Paged PT awaiting a call back.

## 2014-12-27 NOTE — Progress Notes (Signed)
Patient ID: [name] [name]  male  ZOX:[id number]    DOB: 1940/12/21    DOA: 12/26/2014  PCP: Lucrezia Starch, MD   Brief history of present illness  74 yo male with mild dementia, ESRD on HD TTS, CAD s/p CABG apparently had episode of altered mental status and continued to have episodes of confusion throughout the day since yesterday afternoon. Patient's family reported that the episodes of confusion were lasting about an hour with mild slurring. Patient also reported headache, mild but also has similar headaches since April 2015. He is on aspirin daily. Per wife, patient has been following since April of last year and last fall was 2 weeks ago. Patient was brought to ED. CT head showed subdural hematoma, mixed on the left side with chronic and acute blood, compression of the left high parietal sulci and 4 mm of left-to-right shift. Neurosurgery was consulted, Dr. Ellene Route who recommended inpatient monitoring and will evaluate patient in a.m.  Assessment/Plan: Principal Problem:   Subdural hematoma likely related to multiple falls - CT head showed mixed hematoma on the left side with chronic and acute blood, compression of the left high parietal sulci and 4 mm of left-to-right shift. - Hold aspirin, repeat CT head planned today - Neurosurgery evaluation pending - PTOT evaluation after the repeat CT head is done.  Active Problems:   Hypercholesterolemia - Continue statin    Malignant hypertension: With history of orthostatic hypotension, goal systolic BP [phone] - Patient has a significant history of orthostatic hypotension, wears abdominal binders. - Currently on Lopressor 37.5 mg BID at home, BP however running in high 180s to 200s, will increase Lopressor to 50 mg twice a day at home, avoid too tight titration    End stage renal disease on HD - Patient is on hemodialysis Tuesday Thursday and Saturday, had hemodialysis yesterday    Hypothyroidism - Continue Synthroid    Diabetes type  2, controlled - Blood sugars currently stable   DVT Prophylaxis: SCDs  Code Status: Full code  Family Communication: Discussed in detail with patient's wife at the bedside  Disposition: Awaiting repeat CT head, neurosurgery evaluation and PT evaluation, hopefully DC home later today if medically stable  Consultants:  Neurosurgery  Procedures:  CT head  Antibiotics:  None    Subjective: Patient seen and examined, no acute complaints, no dizziness, lightheadedness or any chest pain, shortness of breath  Objective: Weight change:  No intake or output data in the 24 hours ending 12/27/14 1122 Blood pressure 186/60, pulse 66, temperature 98.1 F (36.7 C), temperature source Oral, resp. rate 20, height 6\' 2"  (1.88 m), weight 63.4 kg (139 lb 12.4 oz), SpO2 100 %.  Physical Exam: General: Alert and awake, oriented x3, not in any acute distress., Hearing deficit CVS: S1-S2 clear, no murmur rubs or gallops Chest: clear to auscultation bilaterally, no wheezing, rales or rhonchi Abdomen: soft nontender, nondistended, normal bowel sounds  Extremities: no cyanosis, clubbing or edema noted bilaterally Neuro: Cranial nerves II-XII intact, no focal neurological deficits  Lab Results: Basic Metabolic Panel:  Recent Labs Lab 12/26/14 2006 12/27/14 0446  NA 138 138  K 3.1* 3.0*  CL 96 96  CO2 35* 38*  GLUCOSE 155* 97  BUN 14 22  CREATININE 2.03* 2.71*  CALCIUM 8.6 8.6   Liver Function Tests:  Recent Labs Lab 12/26/14 2006 12/27/14 0446  AST 19 14  ALT 12 12  ALKPHOS 142* 143*  BILITOT 0.6 0.6  PROT 6.1 6.2  ALBUMIN 3.4*  3.2*   No results for input(s): LIPASE, AMYLASE in the last 168 hours. No results for input(s): AMMONIA in the last 168 hours. CBC:  Recent Labs Lab 12/26/14 2006 12/27/14 0446  WBC 6.8 5.9  NEUTROABS 4.8 3.5  HGB 10.0* 9.9*  HCT 31.8* 32.0*  MCV 97.5 98.2  PLT 291 281   Cardiac Enzymes:  Recent Labs Lab 12/26/14 2006  TROPONINI  0.04*   BNP: Invalid input(s): POCBNP CBG:  Recent Labs Lab 12/26/14 2005  GLUCAP 141*     Micro Results: No results found for this or any previous visit (from the past 240 hour(s)).  Studies/Results: Ct Head Wo Contrast  12/26/2014   CLINICAL DATA:  Acute altered mental status.  EXAM: CT HEAD WITHOUT CONTRAST  TECHNIQUE: Contiguous axial images were obtained from the base of the skull through the vertex without intravenous contrast.  COMPARISON:  08/25/2014.  FINDINGS: There is a complex extra-axial fluid collection on the left side which is likely a mixed subdural hematoma with old and new blood. There is mass effect on the upper left cerebrum with compression of the sulci. There is also mild mass effect on the left lateral ventricle and a maximum of 4 mm of left-to-right shift. No findings for hemispheric infarction or parenchymal hemorrhage. The brainstem and cerebellum are grossly normal. Stable dural calcifications.  No skull fracture. No bone lesions. The paranasal sinuses and mastoid air cells are clear. The globes are intact.  IMPRESSION: 1. 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 Compression of the left high parietal sulci and 4 mm of left-to-right shift. 2. No hemispheric infarction or intraparenchymal hemorrhage.   Electronically Signed   By: Kalman Jewels M.D.   On: 12/26/2014 21:51   Ct Hip Right Wo Contrast  12/04/2014   CLINICAL DATA:  Increasing right hip pain after fall yesterday. Unable to bear weight on right leg.  EXAM: CT OF THE RIGHT HIP WITHOUT CONTRAST  TECHNIQUE: Multidetector CT imaging of the right hip was performed according to the standard protocol. Multiplanar CT image reconstructions were also generated.  COMPARISON:  Femur radiograph earlier this day. Pelvis CT 10/05/2014  FINDINGS: The cortical margins of the pelvis and right hip are intact. There is no acute fracture. There is a vascular channel at the lateral aspect of the  greater trochanter. Osteoarthritis noted of the right hip with joint space narrowing and osteophytes, similar to prior exam. No evidence of fracture of the included pubic rami are sacrum. Chronic tendinopathy at the hamstrings insertion. There is no large hip joint effusion. Soft tissue edema appears to be related to anasarca diminished from prior exam. Dense atherosclerotic vascular calcifications are seen.  IMPRESSION: Osteoarthritis of the right hip. No CT findings of acute fracture. MRI could be considered if there is persistent clinical concern for fracture.   Electronically Signed   By: Jeb Levering M.D.   On: 12/04/2014 18:55   Dg Femur, Min 2 Views Right  12/04/2014   CLINICAL DATA:  Initial encounter for fall yesterday. Bruise on right hip.  EXAM: DG FEMUR 2+V*R*  COMPARISON:  None.  FINDINGS: Vascular calcifications. Medial and lateral compartment joint space narrowing. Chondrocalcinosis within the menisci. Artifact degradation superiorly, secondary to primarily clothing. There may be overlying soft tissues as well. Patellofemoral joint space narrowing. No knee joint effusion.  IMPRESSION: No acute osseous abnormality.  Vascular calcifications.  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  Electronically Signed   By: Abigail Miyamoto M.D.   On: 12/04/2014 17:24    Medications: Scheduled Meds: . aspirin EC  325 mg Oral QHS  . cholecalciferol  1,000 Units Oral Daily  . cycloSPORINE modified  50 mg Oral BID  . ferrous sulfate  325 mg Oral TID WC  . FLUoxetine  20 mg Oral Q breakfast  . levothyroxine  75 mcg Oral QAC breakfast  . metoprolol tartrate  50 mg Oral BID  . mycophenolate  250 mg Oral BID  . pantoprazole  20 mg Oral Daily  . pravastatin  10 mg Oral Q breakfast  . sodium chloride  3 mL Intravenous Q12H  . tamsulosin  0.4 mg Oral QPC supper  . vitamin B-12  1,000 mcg Oral Q breakfast   Time  spent 25 minutes   LOS: 1 day   RAI,RIPUDEEP M.D. [facility] 12/27/2014, 11:22 AM Pager: [phone]  If 7PM-7AM, please contact night-coverage [email or web address] Password [email or web address]

## 2014-12-27 NOTE — Discharge Summary (Signed)
Physician Discharge Summary  Patient ID: [name] [name] MRN: [id number] DOB/AGE: 1941-09-30 74 y.o.  Admit date: 12/26/2014 Discharge date: 12/27/2014  Primary Care Physician:  [name] Starch, MD  Discharge Diagnoses:    . Subdural hematoma . End stage renal disease . Hypothyroidism . Malignant hypertension . Hypercholesterolemia  Consults: Neurosurgery, Dr. Ellene Route   Recommendations for Outpatient Follow-up:  Patient was recommended to hold aspirin until further instructions from his PCP or neurosurgery, Dr. Ellene Route   DIET: Carb modified diet    Allergies:   Allergies  Allergen Reactions  . Norvasc [Amlodipine Besylate] Swelling and Other (See Comments)    Swelling of the lower body      Discharge Medications:   Medication List    STOP taking these medications        aspirin EC 325 MG tablet      TAKE these medications        acetaminophen 325 MG tablet  Commonly known as:  TYLENOL  Take 2 tablets (650 mg total) by mouth every 6 (six) hours as needed for mild pain (or Fever >/= 101).     cholecalciferol 1000 UNITS tablet  Commonly known as:  VITAMIN D  Take 1,000 Units by mouth daily.     cycloSPORINE modified 25 MG capsule  Commonly known as:  NEORAL  Take 50 mg by mouth 2 (two) times daily.     ferrous sulfate 325 (65 FE) MG tablet  Take 325 mg by mouth 3 (three) times daily with meals.     FLUoxetine 20 MG capsule  Commonly known as:  PROZAC  Take 20 mg by mouth daily with breakfast.     HYDROcodone-acetaminophen 5-325 MG per tablet  Commonly known as:  NORCO/VICODIN  Take 1-2 tablets by mouth every 6 (six) hours as needed for moderate pain or severe pain.     lansoprazole 15 MG capsule  Commonly known as:  PREVACID  Take 30 mg by mouth 2 (two) times daily.     levothyroxine 75 MCG tablet  Commonly known as:  SYNTHROID, LEVOTHROID  Take 75 mcg by mouth daily before breakfast.     metoprolol tartrate 25 MG tablet  Commonly known as:   LOPRESSOR  Take 2 tablets (50 mg total) by mouth 2 (two) times daily.     mycophenolate 250 MG capsule  Commonly known as:  CELLCEPT  Take 250 mg by mouth 2 (two) times daily.     pravastatin 10 MG tablet  Commonly known as:  PRAVACHOL  Take 10 mg by mouth daily with breakfast.     senna-docusate 8.6-50 MG per tablet  Commonly known as:  Senokot-S  Take 1 tablet by mouth daily.     tamsulosin 0.4 MG Caps capsule  Commonly known as:  FLOMAX  Take 0.4 mg by mouth daily after supper.     vitamin B-12 1000 MCG tablet  Commonly known as:  CYANOCOBALAMIN  Take 1,000 mcg by mouth daily with breakfast.         Brief H and P: For complete details please refer to admission H and P, but in brief40 yo male with mild dementia, ESRD on HD TTS, CAD s/p CABG apparently had episode of altered mental status and continued to have episodes of confusion throughout the day since yesterday afternoon. Patient's family reported that the episodes of confusion were lasting about an hour with mild slurring. Patient also reported headache, mild but also has similar headaches since April 2015. He is on aspirin daily.  Per wife, patient has been following since April of last year and last fall was 2 weeks ago. Patient was brought to ED. CT head showed subdural hematoma, mixed on the left side with chronic and acute blood, compression of the left high parietal sulci and 4 mm of left-to-right shift. Neurosurgery was consulted, Dr. Ellene Route.   Hospital Course:   Subdural hematoma likely related to multiple falls  CT head showed mixed hematoma on the left side with chronic and acute blood, compression of the left high parietal sulci and 4 mm of left-to-right shift. Neurosurgery was consulted, patient was seen by Dr. Ellene Route. [place] patient is neurologically  intact at the time except for mild dementia. Repeat CT scan was canceled by neurosurgery. Recommended follow-up outpatient in 1 month. Continue to hold aspirin for  now. PT evaluation will be done prior to discharge. Per patient's wife, patient is wheelchair bound and plans to take him home with home health. She has [facility] arranged.   Hypercholesterolemia - Continue statin   Malignant hypertension: With history of orthostatic hypotension, goal systolic BP 390-300 - Patient has a significant history of orthostatic hypotension, wears abdominal binders. - Currently on Lopressor 37.5 mg BID at home, BP however running in high 180s to 200s, will increase Lopressor to 50 mg twice a day at home, avoid too tight titration   End stage renal disease on HD - Patient is on hemodialysis Tuesday Thursday and Saturday, had hemodialysis yesterday   Hypothyroidism - Continue Synthroid   Diabetes type 2, controlled - Blood sugars currently stable  Day of Discharge BP 186/60 mmHg  Pulse 66  Temp(Src) 98.1 F (36.7 C) (Oral)  Resp 20  Ht 6\' 2"  (1.88 m)  Wt 63.4 kg (139 lb 12.4 oz)  BMI 17.94 kg/m2  SpO2 100%  Physical Exam: General: Alert and awake oriented x 3, NAD HEENT: anicteric sclera, pupils reactive to light and accommodation CVS: S1-S2 clear no murmur rubs or gallops Chest: clear to auscultation bilaterally, no wheezing rales or rhonchi Abdomen: soft nontender, nondistended, normal bowel sounds Extremities: no cyanosis, clubbing or edema noted bilaterally, gait not tested Neuro: Cranial nerves II-XII intact, no focal neurological deficits   The results of significant diagnostics from this hospitalization (including imaging, microbiology, ancillary and laboratory) are listed below for reference.    LAB RESULTS: Basic Metabolic Panel:  Recent Labs Lab 12/26/14 2006 12/27/14 0446  NA 138 138  K 3.1* 3.0*  CL 96 96  CO2 35* 38*  GLUCOSE 155* 97  BUN 14 22  CREATININE 2.03* 2.71*  CALCIUM 8.6 8.6   Liver Function Tests:  Recent Labs Lab 12/26/14 2006 12/27/14 0446  AST 19 14  ALT 12 12  ALKPHOS 142* 143*   BILITOT 0.6 0.6  PROT 6.1 6.2  ALBUMIN 3.4* 3.2*   No results for input(s): LIPASE, AMYLASE in the last 168 hours. No results for input(s): AMMONIA in the last 168 hours. CBC:  Recent Labs Lab 12/26/14 2006 12/27/14 0446  WBC 6.8 5.9  NEUTROABS 4.8 3.5  HGB 10.0* 9.9*  HCT 31.8* 32.0*  MCV 97.5 98.2  PLT 291 281   Cardiac Enzymes:  Recent Labs Lab 12/26/14 2006  TROPONINI 0.04*   BNP: Invalid input(s): POCBNP CBG:  Recent Labs Lab 12/26/14 2005  GLUCAP 141*    Significant Diagnostic Studies:  Ct Head Wo Contrast  12/26/2014   CLINICAL DATA:  Acute altered mental status.  EXAM: CT HEAD WITHOUT CONTRAST  TECHNIQUE: Contiguous axial images  were obtained from the base of the skull through the vertex without intravenous contrast.  COMPARISON:  08/25/2014.  FINDINGS: There is a complex extra-axial fluid collection on the left side which is likely a mixed subdural hematoma with old and new blood. There is mass effect on the upper left cerebrum with compression of the sulci. There is also mild mass effect on the left lateral ventricle and a maximum of 4 mm of left-to-right shift. No findings for hemispheric infarction or parenchymal hemorrhage. The brainstem and cerebellum are grossly normal. Stable dural calcifications.  No skull fracture. No bone lesions. The paranasal sinuses and mastoid air cells are clear. The globes are intact.  IMPRESSION: 1. 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 Compression of the left high parietal sulci and 4 mm of left-to-right shift. 2. No hemispheric infarction or intraparenchymal hemorrhage.   Electronically Signed   By: Kalman Jewels M.D.   On: 12/26/2014 21:51    2D ECHO:   Disposition and Follow-up:     Discharge Instructions    Diet Carb Modified    Complete by:  As directed      Discharge instructions    Complete by:  As directed   Fall precautions, HOLD aspirin until further instructions from your  primary care physician or neurosurgeon.     Increase activity slowly    Complete by:  As directed             DISPOSITION: Home   DISCHARGE FOLLOW-UP Follow-up Information    Follow up with DUNHAM,CYNTHIA B, MD. Schedule an appointment as soon as possible for a visit in 2 weeks.   Specialty:  Nephrology   Why:  for hospital follow-up   Contact information:   [place] [address] [phone]       Follow up with Earleen Newport, MD. Schedule an appointment as soon as possible for a visit in 4 weeks.   Specialty:  Neurosurgery   Why:  for hospital follow-up   Contact information:   [address] [place] [address] [place] [address] [phone]        Time spent on Discharge: 25 minutes  Signed:   Denman Pichardo M.D. [facility] 12/27/2014, 12:32 PM Pager: [phone]

## 2014-12-27 NOTE — Consult Note (Signed)
Reason for Consult: Subdural hematoma Referring Physician: Dr. Allison Quarry is an 74 y.o. male.  HPI: Patient is a 74 year old right-handed male who has had a number of falls over the past years time. He is on chronic hemodialysis. He was somewhat confused the other day and brought to the emergency department where CT scan demonstrated a presence of a moderate size left subdural hematoma with approximately 4 mm of shift. The hematoma is both chronic and subacute. There is mild mass effect. Clinically the patient denies any headaches. His wife is noted that he has some mild dementia as a baseline but lately he was more confused which brought the suggestion for the workup.  Past Medical History  Diagnosis Date  . CKD III-IV     a. since transplant in 2001 with significant worsening since 06/2013.  Marland Kitchen Hypertension     a. Long h/o HTN, came off of all meds following transplant/wt loss-->meds resumed 06/2013, difficult to control since.  Marland Kitchen History of stroke   . Hypercalcemia     a. 06/2013 Hospitalized in [place].  Marland Kitchen Heart transplanted     a. 1995 CABG x 4 in [place], [place];  b. 2001 Developed CHF;  c. 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 10/2000 Cardiac transplant @ [facility], [place];  e. Historically nl Bx and stress testing.  Last stress test ~ 5 yrs ago.  Marland Kitchen Hernia of abdominal wall   . Hyperlipidemia   . Orthostatic hypotension     a. Has tried support hose and compression stockings - prefers not to wear.  . Coronary artery disease   . Myocardial infarction     PRIOR TO HEART TRANSPLANT  . Shortness of breath     EXERTION  . Orthostatic dizziness   . Stroke 06/2013    NO RESIDUAL - WAS CONFUSED AT TIME - THAT HAS RESOLVED  . Diabetes mellitus without complication     FASTING CBG 90S  . Headache(784.0)   . Cancer     SKIN  . Anemia   . History of blood transfusion   . Dementia     mild    Past Surgical History  Procedure Laterality Date  . Cardiac surgery    . Heart transplant     . Cataract extraction    . Vasectomy    . Skin graft full thickness leg      cat scratch that did not heal  . Coronary artery bypass graft    . Eye surgery Bilateral     CATARACTS  . Bascilic vein transposition Left 07/06/2014    Procedure: [place];  Surgeon: Rosetta Posner, MD;  Location: [facility] OR;  Service: Vascular;  Laterality: Left;    Family History  Problem Relation Age of Onset  . Heart attack Father     died @ 27  . Heart attack Mother     Social History:  reports that he has never smoked. He has never used smokeless tobacco. He reports that he does not drink alcohol or use illicit drugs.  Allergies:  Allergies  Allergen Reactions  . Norvasc [Amlodipine Besylate] Swelling and Other (See Comments)    Swelling of the lower body     Medications: I have reviewed the patient's current medications.  Results for orders placed or performed during the hospital encounter of 12/26/14 (from the past 48 hour(s))  CBG monitoring, ED     Status: Abnormal   Collection Time: 12/26/14  8:05 PM  Result Value  Ref Range   Glucose-Capillary 141 (H) 70 - 99 mg/dL   Comment 1 Notify RN   CBC with Differential     Status: Abnormal   Collection Time: 12/26/14  8:06 PM  Result Value Ref Range   WBC 6.8 4.0 - 10.5 K/uL   RBC 3.26 (L) 4.22 - 5.81 MIL/uL   Hemoglobin 10.0 (L) 13.0 - 17.0 g/dL   HCT 31.8 (L) 39.0 - 52.0 %   MCV 97.5 78.0 - 100.0 fL   MCH 30.7 26.0 - 34.0 pg   MCHC 31.4 30.0 - 36.0 g/dL   RDW 15.5 11.5 - 15.5 %   Platelets 291 150 - 400 K/uL   Neutrophils Relative % 71 43 - 77 %   Neutro Abs 4.8 1.7 - 7.7 K/uL   Lymphocytes Relative 20 12 - 46 %   Lymphs Abs 1.3 0.7 - 4.0 K/uL   Monocytes Relative 8 3 - 12 %   Monocytes Absolute 0.5 0.1 - 1.0 K/uL   Eosinophils Relative 0 0 - 5 %   Eosinophils Absolute 0.0 0.0 - 0.7 K/uL   Basophils Relative 1 0 - 1 %   Basophils Absolute 0.0 0.0 - 0.1 K/uL  Comprehensive metabolic panel     Status: Abnormal    Collection Time: 12/26/14  8:06 PM  Result Value Ref Range   Sodium 138 135 - 145 mmol/L   Potassium 3.1 (L) 3.5 - 5.1 mmol/L   Chloride 96 96 - 112 mmol/L   CO2 35 (H) 19 - 32 mmol/L   Glucose, Bld 155 (H) 70 - 99 mg/dL   BUN 14 6 - 23 mg/dL   Creatinine, Ser 2.03 (H) 0.50 - 1.35 mg/dL   Calcium 8.6 8.4 - 10.5 mg/dL   Total Protein 6.1 6.0 - 8.3 g/dL   Albumin 3.4 (L) 3.5 - 5.2 g/dL   AST 19 0 - 37 U/L   ALT 12 0 - 53 U/L   Alkaline Phosphatase 142 (H) 39 - 117 U/L   Total Bilirubin 0.6 0.3 - 1.2 mg/dL   GFR calc non Af Amer 31 (L) >90 mL/min   GFR calc Af Amer 35 (L) >90 mL/min    Comment: (NOTE) The eGFR has been calculated using the CKD EPI equation. This calculation has not been validated in all clinical situations. eGFR's persistently <90 mL/min signify possible Chronic Kidney Disease.    Anion gap 7 5 - 15  APTT     Status: Abnormal   Collection Time: 12/26/14  8:06 PM  Result Value Ref Range   aPTT 39 (H) 24 - 37 seconds    Comment:        IF BASELINE aPTT IS ELEVATED, SUGGEST PATIENT RISK ASSESSMENT BE USED TO DETERMINE APPROPRIATE ANTICOAGULANT THERAPY.   Troponin I     Status: Abnormal   Collection Time: 12/26/14  8:06 PM  Result Value Ref Range   Troponin I 0.04 (H) <0.031 ng/mL    Comment:        PERSISTENTLY INCREASED TROPONIN VALUES IN THE RANGE OF 0.04-0.49 ng/mL CAN BE SEEN IN:       -UNSTABLE ANGINA       -CONGESTIVE HEART FAILURE       -MYOCARDITIS       -CHEST TRAUMA       -ARRYHTHMIAS       -LATE PRESENTING MYOCARDIAL INFARCTION       -COPD   CLINICAL FOLLOW-UP RECOMMENDED.   Protime-INR     Status:  None   Collection Time: 12/26/14  8:06 PM  Result Value Ref Range   Prothrombin Time 14.4 11.6 - 15.2 seconds   INR 1.11 0.00 - 1.49  CBC with Differential/Platelet     Status: Abnormal   Collection Time: 12/27/14  4:46 AM  Result Value Ref Range   WBC 5.9 4.0 - 10.5 K/uL   RBC 3.26 (L) 4.22 - 5.81 MIL/uL   Hemoglobin 9.9 (L) 13.0 - 17.0  g/dL   HCT 32.0 (L) 39.0 - 52.0 %   MCV 98.2 78.0 - 100.0 fL   MCH 30.4 26.0 - 34.0 pg   MCHC 30.9 30.0 - 36.0 g/dL   RDW 15.4 11.5 - 15.5 %   Platelets 281 150 - 400 K/uL   Neutrophils Relative % 57 43 - 77 %   Neutro Abs 3.5 1.7 - 7.7 K/uL   Lymphocytes Relative 31 12 - 46 %   Lymphs Abs 1.8 0.7 - 4.0 K/uL   Monocytes Relative 10 3 - 12 %   Monocytes Absolute 0.6 0.1 - 1.0 K/uL   Eosinophils Relative 1 0 - 5 %   Eosinophils Absolute 0.0 0.0 - 0.7 K/uL   Basophils Relative 1 0 - 1 %   Basophils Absolute 0.0 0.0 - 0.1 K/uL  Comprehensive metabolic panel     Status: Abnormal   Collection Time: 12/27/14  4:46 AM  Result Value Ref Range   Sodium 138 135 - 145 mmol/L   Potassium 3.0 (L) 3.5 - 5.1 mmol/L   Chloride 96 96 - 112 mmol/L   CO2 38 (H) 19 - 32 mmol/L   Glucose, Bld 97 70 - 99 mg/dL   BUN 22 6 - 23 mg/dL   Creatinine, Ser 2.71 (H) 0.50 - 1.35 mg/dL   Calcium 8.6 8.4 - 10.5 mg/dL   Total Protein 6.2 6.0 - 8.3 g/dL   Albumin 3.2 (L) 3.5 - 5.2 g/dL   AST 14 0 - 37 U/L   ALT 12 0 - 53 U/L   Alkaline Phosphatase 143 (H) 39 - 117 U/L   Total Bilirubin 0.6 0.3 - 1.2 mg/dL   GFR calc non Af Amer 22 (L) >90 mL/min   GFR calc Af Amer 25 (L) >90 mL/min    Comment: (NOTE) The eGFR has been calculated using the CKD EPI equation. This calculation has not been validated in all clinical situations. eGFR's persistently <90 mL/min signify possible Chronic Kidney Disease.    Anion gap 4 (L) 5 - 15    Ct Head Wo Contrast  12/26/2014   CLINICAL DATA:  Acute altered mental status.  EXAM: CT HEAD WITHOUT CONTRAST  TECHNIQUE: Contiguous axial images were obtained from the base of the skull through the vertex without intravenous contrast.  COMPARISON:  08/25/2014.  FINDINGS: There is a complex extra-axial fluid collection on the left side which is likely a mixed subdural hematoma with old and new blood. There is mass effect on the upper left cerebrum with compression of the sulci. There is  also mild mass effect on the left lateral ventricle and a maximum of 4 mm of left-to-right shift. No findings for hemispheric infarction or parenchymal hemorrhage. The brainstem and cerebellum are grossly normal. Stable dural calcifications.  No skull fracture. No bone lesions. The paranasal sinuses and mastoid air cells are clear. The globes are intact.  IMPRESSION: 1. 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 Compression of the left high parietal sulci and 4 mm  of left-to-right shift. 2. No hemispheric infarction or intraparenchymal hemorrhage.   Electronically Signed   By: Kalman Jewels M.D.   On: 12/26/2014 21:51    Review of Systems  Musculoskeletal: Positive for falls.  Neurological: Positive for weakness.   Blood pressure 186/60, pulse 66, temperature 98.1 F (36.7 C), temperature source Oral, resp. rate 20, height 6' 2"  (1.88 m), weight 63.4 kg (139 lb 12.4 oz), SpO2 100 %. Physical Exam  Constitutional: He appears well-developed and well-nourished.  HENT:  Head: Normocephalic and atraumatic.  Eyes: Conjunctivae and EOM are normal. Pupils are equal, round, and reactive to light.  Neck: Neck supple.  Neurological:  Alert oriented. Cranial nerves reveal pupils are 3 mm equal reactive round. Motor function is intact without evidence of a cortical drift. The neck is supple and no meningismus is noted. Gait was not tested as the patient has a severe contusion in his right hip has been nonweightbearing.  Skin: Skin is warm and dry.  Psychiatric: He has a normal mood and affect.    Assessment/Plan: Chronic and subacute subdural hematoma on the left with mild mass effect. Neurologically intact at the current time save for mild dementia. Follow-up will be on an outpatient basis in a month's time.  Navi Erber J 12/27/2014, 11:53 AM

## 2014-12-27 NOTE — Progress Notes (Signed)
MD paged to notify that the neuro surgeon said it is ok for the patient to d/c form his own stand point

## 2014-12-27 NOTE — Progress Notes (Signed)
Patient admitted from ED. Patient alert and oriented x 4. Patient oriented to room and wife at bed side. Patient denies any need at this time.

## 2014-12-28 ENCOUNTER — Other Ambulatory Visit (HOSPITAL_COMMUNITY): Payer: Self-pay | Admitting: Neurosurgery

## 2014-12-29 ENCOUNTER — Ambulatory Visit: Payer: Medicare Other | Admitting: Physician Assistant

## 2014-12-30 ENCOUNTER — Ambulatory Visit: Payer: Medicare HMO | Admitting: Physical Therapy

## 2015-01-04 ENCOUNTER — Ambulatory Visit: Payer: Medicare Other | Admitting: Gastroenterology

## 2015-01-04 ENCOUNTER — Ambulatory Visit: Payer: Medicare HMO | Admitting: Physical Therapy

## 2015-01-06 ENCOUNTER — Encounter: Payer: Self-pay | Admitting: Physical Therapy

## 2015-01-06 ENCOUNTER — Ambulatory Visit: Payer: Medicare HMO | Admitting: Physical Therapy

## 2015-01-06 DIAGNOSIS — M25551 Pain in right hip: Secondary | ICD-10-CM

## 2015-01-06 DIAGNOSIS — I951 Orthostatic hypotension: Secondary | ICD-10-CM | POA: Diagnosis not present

## 2015-01-06 DIAGNOSIS — R262 Difficulty in walking, not elsewhere classified: Secondary | ICD-10-CM

## 2015-01-06 DIAGNOSIS — R5381 Other malaise: Secondary | ICD-10-CM

## 2015-01-06 NOTE — Therapy (Signed)
[address] [place], [place], [address] Phone: [phone]   Fax:  [phone]  Physical Therapy Treatment  Patient Details  Name: [name] [name] MRN: [id number] Date of Birth: January 13, 1941 Referring Provider:  Mcarthur Rossetti*  Encounter Date: 01/06/2015      PT End of Session - 01/06/15 1628    Visit Number 2   PT Start Time 0881   Equipment Utilized During Treatment Gait belt   Activity Tolerance Patient limited by fatigue      Past Medical History  Diagnosis Date  . CKD III-IV     a. since transplant in 2001 with significant worsening since 06/2013.  Marland Kitchen Hypertension     a. Long h/o HTN, came off of all meds following transplant/wt loss-->meds resumed 06/2013, difficult to control since.  Marland Kitchen History of stroke   . Hypercalcemia     a. 06/2013 Hospitalized in [place].  Marland Kitchen Heart transplanted     a. 1995 CABG x 4 in [place], [place];  b. 2001 Developed CHF;  c. 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 10/2000 Cardiac transplant @ [facility], [place];  e. Historically nl Bx and stress testing.  Last stress test ~ 5 yrs ago.  Marland Kitchen Hernia of abdominal wall   . Hyperlipidemia   . Orthostatic hypotension     a. Has tried support hose and compression stockings - prefers not to wear.  . Coronary artery disease   . Myocardial infarction     PRIOR TO HEART TRANSPLANT  . Shortness of breath     EXERTION  . Orthostatic dizziness   . Stroke 06/2013    NO RESIDUAL - WAS CONFUSED AT TIME - THAT HAS RESOLVED  . Diabetes mellitus without complication     FASTING CBG 90S  . Headache(784.0)   . Cancer     SKIN  . Anemia   . History of blood transfusion   . Dementia     mild    Past Surgical History  Procedure Laterality Date  . Cardiac surgery    . Heart transplant    . Cataract extraction    . Vasectomy    . Skin graft full thickness leg      cat scratch that did not heal  . Coronary artery bypass graft    .  Eye surgery Bilateral     CATARACTS  . Bascilic vein transposition Left 07/06/2014    Procedure: [place];  Surgeon: Rosetta Posner, MD;  Location: [place];  Service: Vascular;  Laterality: Left;    There were no vitals taken for this visit.  Visit Diagnosis:  Debility  Hip pain, right  Difficulty walking      Subjective Assessment - 01/06/15 1622    Symptoms Patient has had 20+ falls over the past year.  He also has a hip contusion.  Wife reports that he was diagnosed with a subdural hematoma recently.   Pertinent History on dialysis, numerous falls, debility   Limitations Standing;Walking;House hold activities   How long can you stand comfortably? 2 mins   How long can you walk comfortably? 20 feet   Patient Stated Goals no falls   Currently in Pain? Yes   Pain Score 2    Pain Location Hip   Pain Orientation Right   Pain Descriptors / Indicators Aching   Pain Type Acute pain   Pain Onset More than a month ago   Pain Frequency Occasional  Aggravating Factors  sitting   Pain Relieving Factors the "last treatment really helped"   Effect of Pain on Daily Activities limited   Multiple Pain Sites No                    [facility] Adult PT Treatment/Exercise - 01/06/15 0001    Ambulation/Gait   Ambulation/Gait --  ambulates with CGA with FWW 2x50 feet and between all machin   Knee/Hip Exercises: Aerobic   Stationary Bike nu step L5 x 7 mins   Knee/Hip Exercises: Standing   Heel Raises --  2x10   Other Standing Knee Exercises 2 1/2 # marches, hip abduction and extension   Other Standing Knee Exercises side stepping, backward walking   Moist Heat Therapy   Number Minutes Moist Heat 15 Minutes   Moist Heat Location --  low back in sitting   Electrical Stimulation   Electrical Stimulation Location lumbar right hip area   Electrical Stimulation Goals Pain   Knee/Hip Exercises: Machines for Strengthening   Cybex Knee Extension 5# 2x15   Cybex Knee  Flexion 20# 2x15      Seated exercises of marching, LAQ's, heel and toe raises               PT Long Term Goals - 01/06/15 1630    PT LONG TERM GOAL #1   Title No falls over a 4 week period   Time 8   Period Weeks   Status New   PT LONG TERM GOAL #2   Title walk 120 feet with walker and supervision    Time 8   Period Weeks   Status New   PT LONG TERM GOAL #3   Title transfer independently   Time 8   Period Weeks   Status New   PT LONG TERM GOAL #4   Title decrease hip pain 50%   Time 8   Period Weeks   Status New               Plan - 01/06/15 1628    Clinical Impression Statement Patient reports last treatment really helped the pain, wife reports that he has been transferring better and with less assistance   Pt will benefit from skilled therapeutic intervention in order to improve on the following deficits Abnormal gait;Cardiopulmonary status limiting activity;Decreased activity tolerance;Decreased balance;Decreased endurance;Decreased safety awareness;Decreased strength;Difficulty walking;Pain   Rehab Potential Good   PT Frequency 2x / week   PT Duration 8 weeks   PT Treatment/Interventions Electrical Stimulation;Ultrasound;Moist Heat;Gait training;Functional mobility training;Balance training;Neuromuscular re-education;Therapeutic exercise;Therapeutic activities;Patient/family education   PT Next Visit Plan continue to add exercises and balance   Consulted and Agree with Plan of Care Patient;Family member/caregiver   Family Member Consulted wife        Problem List Patient Active Problem List   Diagnosis Date Noted  . Subdural hematoma 12/26/2014  . Diabetes type 2, controlled 11/01/2014  . Knee pain, right 10/21/2014  . ESRD on dialysis 10/12/2014  . Loculated pleural effusion 10/06/2014  . Weakness generalized 10/06/2014  . Hearing impaired 10/06/2014  . DM (diabetes mellitus), type 2 with renal complications 52/77/8242  . Hypothyroidism  10/06/2014  . Hypertension 10/05/2014  . Hypertensive crisis 10/05/2014  . End stage renal disease 08/04/2014  . Near syncope 07/06/2014  . Malignant hypertension 06/21/2014  . Heart transplanted   . Hypertensive heart disease   . CKD III-IV   . Orthostatic hypotension   . Acute on chronic renal failure  06/20/2014  . Hypertensive chronic kidney disease 06/20/2014  . Type II or unspecified type diabetes mellitus with unspecified complication, uncontrolled 06/20/2014  . Hypothyroidism 06/20/2014  . Hypercholesterolemia 06/20/2014  . CMV (cytomegalovirus infection) status positive 06/20/2014    Sumner Boast, PT 01/06/2015, 4:56 PM  [address] [place], [place], [address] Phone: [phone]   Fax:  [phone]

## 2015-01-11 ENCOUNTER — Ambulatory Visit: Payer: Medicare HMO | Admitting: Physical Therapy

## 2015-01-11 DIAGNOSIS — M25551 Pain in right hip: Secondary | ICD-10-CM

## 2015-01-11 DIAGNOSIS — R262 Difficulty in walking, not elsewhere classified: Secondary | ICD-10-CM

## 2015-01-11 DIAGNOSIS — R5381 Other malaise: Secondary | ICD-10-CM

## 2015-01-11 DIAGNOSIS — I951 Orthostatic hypotension: Secondary | ICD-10-CM | POA: Diagnosis not present

## 2015-01-11 NOTE — Therapy (Signed)
[address] [place], [place], [address] Phone: [phone]   Fax:  [phone]  Physical Therapy Treatment  Patient Details  Name: [name] [name] MRN: [id number] Date of Birth: 1941-05-19 Referring Provider:  Mcarthur Rossetti*  Encounter Date: 01/11/2015      PT End of Session - 01/11/15 1638    Visit Number 3   Number of Visits 16   PT Start Time 1545   PT Stop Time 1639   PT Time Calculation (min) 54 min   Equipment Utilized During Treatment Gait belt   Activity Tolerance Patient limited by fatigue   Behavior During Therapy [facility] for tasks assessed/performed      Past Medical History  Diagnosis Date  . CKD III-IV     a. since transplant in 2001 with significant worsening since 06/2013.  Marland Kitchen Hypertension     a. Long h/o HTN, came off of all meds following transplant/wt loss-->meds resumed 06/2013, difficult to control since.  Marland Kitchen History of stroke   . Hypercalcemia     a. 06/2013 Hospitalized in [place].  Marland Kitchen Heart transplanted     a. 1995 CABG x 4 in [place], [place];  b. 2001 Developed CHF;  c. 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 10/2000 Cardiac transplant @ [facility], [place];  e. Historically nl Bx and stress testing.  Last stress test ~ 5 yrs ago.  Marland Kitchen Hernia of abdominal wall   . Hyperlipidemia   . Orthostatic hypotension     a. Has tried support hose and compression stockings - prefers not to wear.  . Coronary artery disease   . Myocardial infarction     PRIOR TO HEART TRANSPLANT  . Shortness of breath     EXERTION  . Orthostatic dizziness   . Stroke 06/2013    NO RESIDUAL - WAS CONFUSED AT TIME - THAT HAS RESOLVED  . Diabetes mellitus without complication     FASTING CBG 90S  . Headache(784.0)   . Cancer     SKIN  . Anemia   . History of blood transfusion   . Dementia     mild    Past Surgical History  Procedure Laterality Date  . Cardiac surgery    . Heart transplant    . Cataract  extraction    . Vasectomy    . Skin graft full thickness leg      cat scratch that did not heal  . Coronary artery bypass graft    . Eye surgery Bilateral     CATARACTS  . Bascilic vein transposition Left 07/06/2014    Procedure: [place];  Surgeon: Rosetta Posner, MD;  Location: [place];  Service: Vascular;  Laterality: Left;    There were no vitals taken for this visit.  Visit Diagnosis:  Debility  Hip pain, right  Difficulty walking      Subjective Assessment - 01/11/15 1551    Symptoms doing good   Currently in Pain? No/denies   Multiple Pain Sites No                    [facility] Adult PT Treatment/Exercise - 01/11/15 0001    Balance   Balance Assessed Yes   Static Standing Balance   Static Standing - Balance Support No upper extremity supported   Static Standing - Level of Assistance 4: Min assist   Static Standing - Comment/# of Minutes 3 min  head turns   Exercises  Exercises Knee/Hip   Knee/Hip Exercises: Aerobic   Stationary Bike nustep level 5  6 min   Knee/Hip Exercises: Machines for Strengthening   Cybex Knee Extension 10#  3x10   Cybex Knee Flexion 25#   3x10   Knee/Hip Exercises: Standing   Other Standing Knee Exercises 4 way hip  2x15 right; 1set left   Knee/Hip Exercises: Machines for Strengthening   Cybex Leg Press 30#  2x15                PT Education - 01/11/15 1616    Education provided Yes   Education Details theraband hip exercise handout with red theraband   Person(s) Educated Patient;Child(ren)   Methods Explanation;Demonstration   Comprehension Verbalized understanding;Returned demonstration             PT Long Term Goals - 01/06/15 1630    PT LONG TERM GOAL #1   Title No falls over a 4 week period   Time 8   Period Weeks   Status New   PT LONG TERM GOAL #2   Title walk 120 feet with walker and supervision    Time 8   Period Weeks   Status New   PT LONG TERM GOAL #3   Title transfer  independently   Time 8   Period Weeks   Status New   PT LONG TERM GOAL #4   Title decrease hip pain 50%   Time 8   Period Weeks   Status New               Plan - 01/11/15 1624    Clinical Impression Statement Patient reports no pain.  Decreased strength in bilateral hips.  Unable to perform left hip abduction with 5 pounds on the pulleys.   Pt will benefit from skilled therapeutic intervention in order to improve on the following deficits Abnormal gait;Cardiopulmonary status limiting activity;Decreased activity tolerance;Decreased balance;Decreased endurance;Decreased safety awareness;Decreased strength;Difficulty walking;Pain   Rehab Potential Good   PT Frequency 2x / week   PT Duration 8 weeks   PT Treatment/Interventions Electrical Stimulation;Ultrasound;Moist Heat;Gait training;Functional mobility training;Balance training;Neuromuscular re-education;Therapeutic exercise;Therapeutic activities;Patient/family education   PT Next Visit Plan continue to add exercises and balance   Consulted and Agree with Plan of Care Patient;Family member/caregiver        Problem List Patient Active Problem List   Diagnosis Date Noted  . Subdural hematoma 12/26/2014  . Diabetes type 2, controlled 11/01/2014  . Knee pain, right 10/21/2014  . ESRD on dialysis 10/12/2014  . Loculated pleural effusion 10/06/2014  . Weakness generalized 10/06/2014  . Hearing impaired 10/06/2014  . DM (diabetes mellitus), type 2 with renal complications 98/33/8250  . Hypothyroidism 10/06/2014  . Hypertension 10/05/2014  . Hypertensive crisis 10/05/2014  . End stage renal disease 08/04/2014  . Near syncope 07/06/2014  . Malignant hypertension 06/21/2014  . Heart transplanted   . Hypertensive heart disease   . CKD III-IV   . Orthostatic hypotension   . Acute on chronic renal failure 06/20/2014  . Hypertensive chronic kidney disease 06/20/2014  . Type II or unspecified type diabetes mellitus with  unspecified complication, uncontrolled 06/20/2014  . Hypothyroidism 06/20/2014  . Hypercholesterolemia 06/20/2014  . CMV (cytomegalovirus infection) status positive 06/20/2014    Julio Storr  PTA 01/11/2015, 4:39 PM  [address] [place], [place], [address] Phone: [phone]   Fax:  [phone]

## 2015-01-13 ENCOUNTER — Ambulatory Visit: Payer: Medicare HMO | Admitting: Physical Therapy

## 2015-01-18 ENCOUNTER — Encounter: Payer: Self-pay | Admitting: Physical Therapy

## 2015-01-18 ENCOUNTER — Ambulatory Visit: Payer: Medicare HMO | Admitting: Physical Therapy

## 2015-01-18 DIAGNOSIS — I951 Orthostatic hypotension: Secondary | ICD-10-CM | POA: Diagnosis not present

## 2015-01-18 DIAGNOSIS — R5381 Other malaise: Secondary | ICD-10-CM

## 2015-01-18 DIAGNOSIS — M25551 Pain in right hip: Secondary | ICD-10-CM

## 2015-01-18 DIAGNOSIS — R262 Difficulty in walking, not elsewhere classified: Secondary | ICD-10-CM

## 2015-01-18 NOTE — Therapy (Signed)
[address] [place], [place], [address] Phone: [phone]   Fax:  [phone]  Physical Therapy Treatment  Patient Details  Name: [name] [name] MRN: [id number] Date of Birth: 08/30/1941 Referring Provider:  Mcarthur Rossetti*  Encounter Date: 01/18/2015      PT End of Session - 01/18/15 1737    Visit Number 4   Number of Visits 16   PT Start Time 1640   PT Stop Time 1738   PT Time Calculation (min) 58 min   Equipment Utilized During Treatment Gait belt   Activity Tolerance Patient limited by fatigue;Patient tolerated treatment well   Behavior During Therapy [facility] for tasks assessed/performed      Past Medical History  Diagnosis Date  . CKD III-IV     a. since transplant in 2001 with significant worsening since 06/2013.  Marland Kitchen Hypertension     a. Long h/o HTN, came off of all meds following transplant/wt loss-->meds resumed 06/2013, difficult to control since.  Marland Kitchen History of stroke   . Hypercalcemia     a. 06/2013 Hospitalized in [place].  Marland Kitchen Heart transplanted     a. 1995 CABG x 4 in [place], [place];  b. 2001 Developed CHF;  c. 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 10/2000 Cardiac transplant @ [facility], [place];  e. Historically nl Bx and stress testing.  Last stress test ~ 5 yrs ago.  Marland Kitchen Hernia of abdominal wall   . Hyperlipidemia   . Orthostatic hypotension     a. Has tried support hose and compression stockings - prefers not to wear.  . Coronary artery disease   . Myocardial infarction     PRIOR TO HEART TRANSPLANT  . Shortness of breath     EXERTION  . Orthostatic dizziness   . Stroke 06/2013    NO RESIDUAL - WAS CONFUSED AT TIME - THAT HAS RESOLVED  . Diabetes mellitus without complication     FASTING CBG 90S  . Headache(784.0)   . Cancer     SKIN  . Anemia   . History of blood transfusion   . Dementia     mild    Past Surgical History  Procedure Laterality Date  . Cardiac surgery    .  Heart transplant    . Cataract extraction    . Vasectomy    . Skin graft full thickness leg      cat scratch that did not heal  . Coronary artery bypass graft    . Eye surgery Bilateral     CATARACTS  . Bascilic vein transposition Left 07/06/2014    Procedure: [place];  Surgeon: Rosetta Posner, MD;  Location: [place];  Service: Vascular;  Laterality: Left;    There were no vitals taken for this visit.  Visit Diagnosis:  Difficulty walking  Hip pain, right  Debility      Subjective Assessment - 01/18/15 1642    Symptoms doing good   Currently in Pain? No/denies   Multiple Pain Sites No                    [facility] Adult PT Treatment/Exercise - 01/18/15 0001    Transfers   Transfers Sit to Stand;Stand to Sit   Sit to Stand 7: Independent;With upper extremity assist   Stand to Sit 7: Independent;Without upper extremity assist   Ambulation/Gait   Ambulation/Gait Yes   Ambulation/Gait Assistance 5: Supervision   Ambulation Distance (  Feet) 300 Feet   Assistive device 4-wheeled walker   Gait Pattern Step-through pattern   Ambulation Surface Level;Indoor;Other (comment)  carpet and tile   Balance   Balance Assessed Yes   Static Standing Balance   Tandem Stance - Right Leg 3  seconds   Tandem Stance - Left Leg 3  seconds   Exercises   Exercises Knee/Hip   Knee/Hip Exercises: Aerobic   Stationary Bike 6 minutes  Nustep level 6   Knee/Hip Exercises: Machines for Strengthening   Cybex Knee Extension 10#  2x15   Cybex Knee Flexion 25#   2x15   Cybex Leg Press 30#  calf press 2x15   Knee/Hip Exercises: Supine   Bridges 2 sets;10 reps   Straight Leg Raises 2 sets;15 reps   Knee/Hip Exercises: Sidelying   Hip ABduction 2 sets;10 reps   Hip ADduction 2 sets;10 reps   Knee/Hip Exercises: Machines for Strengthening   Total Gym Leg Press 30#  2x15 position 6                PT Education - 01/18/15 1736    Education provided Yes    Education Details reissued theraband exercises; supine and sidelying hip HEP for bed   Person(s) Educated Patient;Spouse   Methods Explanation;Demonstration   Comprehension Verbalized understanding;Returned demonstration             PT Long Term Goals - 01/18/15 1742    PT LONG TERM GOAL #1   Title No falls over a 4 week period   Time 8   Period Weeks   Status On-going   PT LONG TERM GOAL #2   Title walk 120 feet with walker and supervision    Time 8   Period Weeks   Status Achieved   PT LONG TERM GOAL #3   Title transfer independently   Time 8   Period Weeks   Status Achieved   PT LONG TERM GOAL #4   Title decrease hip pain 50%   Time 8   Period Weeks   Status Achieved               Plan - 01/18/15 1744    Clinical Impression Statement Patient tends to fall to the right when he loses balance.  Unable to perform sidelying hip add with right hip.   Pt will benefit from skilled therapeutic intervention in order to improve on the following deficits Abnormal gait;Cardiopulmonary status limiting activity;Decreased activity tolerance;Decreased balance;Decreased endurance;Decreased safety awareness;Decreased strength;Difficulty walking;Pain   Rehab Potential Good   PT Frequency 2x / week   PT Duration 8 weeks   PT Treatment/Interventions Electrical Stimulation;Ultrasound;Moist Heat;Gait training;Functional mobility training;Balance training;Neuromuscular re-education;Therapeutic exercise;Therapeutic activities;Patient/family education   PT Next Visit Plan Continue to strengthen.   Consulted and Agree with Plan of Care Patient;Family member/caregiver   Family Member Consulted wife        Problem List Patient Active Problem List   Diagnosis Date Noted  . Subdural hematoma 12/26/2014  . Diabetes type 2, controlled 11/01/2014  . Knee pain, right 10/21/2014  . ESRD on dialysis 10/12/2014  . Loculated pleural effusion 10/06/2014  . Weakness generalized 10/06/2014   . Hearing impaired 10/06/2014  . DM (diabetes mellitus), type 2 with renal complications 78/24/2353  . Hypothyroidism 10/06/2014  . Hypertension 10/05/2014  . Hypertensive crisis 10/05/2014  . End stage renal disease 08/04/2014  . Near syncope 07/06/2014  . Malignant hypertension 06/21/2014  . Heart transplanted   . Hypertensive heart disease   .  CKD III-IV   . Orthostatic hypotension   . Acute on chronic renal failure 06/20/2014  . Hypertensive chronic kidney disease 06/20/2014  . Type II or unspecified type diabetes mellitus with unspecified complication, uncontrolled 06/20/2014  . Hypothyroidism 06/20/2014  . Hypercholesterolemia 06/20/2014  . CMV (cytomegalovirus infection) status positive 06/20/2014    Alishea Beaudin  PTA 01/18/2015, 5:48 PM  [address] [place], [place], [address] Phone: [phone]   Fax:  [phone]

## 2015-01-20 ENCOUNTER — Ambulatory Visit: Payer: Medicare HMO | Attending: Orthopaedic Surgery | Admitting: Physical Therapy

## 2015-01-20 ENCOUNTER — Encounter: Payer: Self-pay | Admitting: Physical Therapy

## 2015-01-20 DIAGNOSIS — R5381 Other malaise: Secondary | ICD-10-CM

## 2015-01-20 DIAGNOSIS — R262 Difficulty in walking, not elsewhere classified: Secondary | ICD-10-CM

## 2015-01-20 DIAGNOSIS — I951 Orthostatic hypotension: Secondary | ICD-10-CM | POA: Diagnosis present

## 2015-01-20 DIAGNOSIS — R269 Unspecified abnormalities of gait and mobility: Secondary | ICD-10-CM | POA: Diagnosis present

## 2015-01-20 NOTE — Therapy (Signed)
[address] [place], [place], [address] Phone: [phone]   Fax:  [phone]  Physical Therapy Treatment  Patient Details  Name: [name] [name] MRN: [id number] Date of Birth: 08-25-1941 Referring Provider:  Mcarthur Rossetti*  Encounter Date: 01/20/2015      PT End of Session - 01/20/15 1701    Visit Number 5   Number of Visits 16   PT Start Time 7673   PT Stop Time 1700   PT Time Calculation (min) 47 min      Past Medical History  Diagnosis Date  . CKD III-IV     a. since transplant in 2001 with significant worsening since 06/2013.  Marland Kitchen Hypertension     a. Long h/o HTN, came off of all meds following transplant/wt loss-->meds resumed 06/2013, difficult to control since.  Marland Kitchen History of stroke   . Hypercalcemia     a. 06/2013 Hospitalized in [place].  Marland Kitchen Heart transplanted     a. 1995 CABG x 4 in [place], [place];  b. 2001 Developed CHF;  c. 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 10/2000 Cardiac transplant @ [facility], [place];  e. Historically nl Bx and stress testing.  Last stress test ~ 5 yrs ago.  Marland Kitchen Hernia of abdominal wall   . Hyperlipidemia   . Orthostatic hypotension     a. Has tried support hose and compression stockings - prefers not to wear.  . Coronary artery disease   . Myocardial infarction     PRIOR TO HEART TRANSPLANT  . Shortness of breath     EXERTION  . Orthostatic dizziness   . Stroke 06/2013    NO RESIDUAL - WAS CONFUSED AT TIME - THAT HAS RESOLVED  . Diabetes mellitus without complication     FASTING CBG 90S  . Headache(784.0)   . Cancer     SKIN  . Anemia   . History of blood transfusion   . Dementia     mild    Past Surgical History  Procedure Laterality Date  . Cardiac surgery    . Heart transplant    . Cataract extraction    . Vasectomy    . Skin graft full thickness leg      cat scratch that did not heal  . Coronary artery bypass graft    . Eye surgery Bilateral      CATARACTS  . Bascilic vein transposition Left 07/06/2014    Procedure: [place];  Surgeon: Rosetta Posner, MD;  Location: [place];  Service: Vascular;  Laterality: Left;    There were no vitals taken for this visit.  Visit Diagnosis:  Difficulty walking  Debility      Subjective Assessment - 01/20/15 1614    Symptoms no falls, feeling better   Pertinent History on dialysis, numerous falls, debility   Limitations Standing;Walking;House hold activities   How long can you stand comfortably? 2 mins   How long can you walk comfortably? 100 feet   Patient Stated Goals no falls   Currently in Pain? Yes   Pain Score 2    Pain Location Hip   Pain Orientation Right   Pain Descriptors / Indicators Aching   Pain Type Acute pain   Pain Onset More than a month ago   Pain Relieving Factors the treatment here                    [facility] Adult PT Treatment/Exercise -  01/20/15 0001    Transfers   Transfers Sit to Stand   Sit to Stand --  worked on using no Counsellor   Tandem Stance - Right Leg 2   Tandem Stance - Left Leg 2   [place]berg - Eyes Closed 3   High Level Balance   High Level Balance Activities Side stepping;Backward walking;Direction changes;Turns;Tandem walking;Marching backwards;Weight-shifting turns;Negotitating around obstacles;Other (comment)   High Level Balance Comments Airex activities   Knee/Hip Exercises: Aerobic   Stationary Bike Nu Step level 5 minutes   Knee/Hip Exercises: Machines for Strengthening   Cybex Knee Extension 10# 2x15   Cybex Knee Flexion 25# 2x15   Cybex Leg Press 30# 3x10   Knee/Hip Exercises: Standing   Heel Raises 2 sets;10 reps                     PT Long Term Goals - 01/20/15 1714    PT LONG TERM GOAL #1   Status On-going   PT LONG TERM GOAL #2   Status On-going   PT LONG TERM GOAL #3   Status On-going   PT LONG TERM GOAL #4   Status On-going               Plan  - 01/20/15 1711    Clinical Impression Statement overall doing much better.  No falls   Pt will benefit from skilled therapeutic intervention in order to improve on the following deficits Abnormal gait;Cardiopulmonary status limiting activity;Decreased activity tolerance;Decreased balance;Decreased endurance;Decreased safety awareness;Decreased strength;Difficulty walking;Pain   Rehab Potential Good   PT Frequency 2x / week   PT Duration 8 weeks   PT Treatment/Interventions Electrical Stimulation;Ultrasound;Moist Heat;Gait training;Functional mobility training;Balance training;Neuromuscular re-education;Therapeutic exercise;Therapeutic activities;Patient/family education   PT Next Visit Plan Continue to strengthen.   Consulted and Agree with Plan of Care Patient;Family member/caregiver        Problem List Patient Active Problem List   Diagnosis Date Noted  . Subdural hematoma 12/26/2014  . Diabetes type 2, controlled 11/01/2014  . Knee pain, right 10/21/2014  . ESRD on dialysis 10/12/2014  . Loculated pleural effusion 10/06/2014  . Weakness generalized 10/06/2014  . Hearing impaired 10/06/2014  . DM (diabetes mellitus), type 2 with renal complications 09/13/8526  . Hypothyroidism 10/06/2014  . Hypertension 10/05/2014  . Hypertensive crisis 10/05/2014  . End stage renal disease 08/04/2014  . Near syncope 07/06/2014  . Malignant hypertension 06/21/2014  . Heart transplanted   . Hypertensive heart disease   . CKD III-IV   . Orthostatic hypotension   . Acute on chronic renal failure 06/20/2014  . Hypertensive chronic kidney disease 06/20/2014  . Type II or unspecified type diabetes mellitus with unspecified complication, uncontrolled 06/20/2014  . Hypothyroidism 06/20/2014  . Hypercholesterolemia 06/20/2014  . CMV (cytomegalovirus infection) status positive 06/20/2014    Sumner Boast, PT 01/20/2015, 5:15 PM  [address] [place], [place], [address] Phone: [phone]   Fax:  [phone]

## 2015-01-22 ENCOUNTER — Other Ambulatory Visit: Payer: Self-pay | Admitting: Neurological Surgery

## 2015-01-22 DIAGNOSIS — S065XAA Traumatic subdural hemorrhage with loss of consciousness status unknown, initial encounter: Secondary | ICD-10-CM

## 2015-01-22 DIAGNOSIS — S065X9A Traumatic subdural hemorrhage with loss of consciousness of unspecified duration, initial encounter: Secondary | ICD-10-CM

## 2015-01-25 ENCOUNTER — Encounter: Payer: Self-pay | Admitting: Physical Therapy

## 2015-01-25 ENCOUNTER — Ambulatory Visit: Payer: Medicare HMO | Admitting: Physical Therapy

## 2015-01-25 ENCOUNTER — Ambulatory Visit
Admission: RE | Admit: 2015-01-25 | Discharge: 2015-01-25 | Disposition: A | Discharge status: Home / Self Care | Payer: Commercial Managed Care - HMO | Source: Ambulatory Visit | Attending: Neurological Surgery | Admitting: Neurological Surgery

## 2015-01-25 DIAGNOSIS — S065X9A Traumatic subdural hemorrhage with loss of consciousness of unspecified duration, initial encounter: Secondary | ICD-10-CM

## 2015-01-25 DIAGNOSIS — R262 Difficulty in walking, not elsewhere classified: Secondary | ICD-10-CM

## 2015-01-25 DIAGNOSIS — R5381 Other malaise: Secondary | ICD-10-CM

## 2015-01-25 DIAGNOSIS — S065XAA Traumatic subdural hemorrhage with loss of consciousness status unknown, initial encounter: Secondary | ICD-10-CM

## 2015-01-25 DIAGNOSIS — I951 Orthostatic hypotension: Secondary | ICD-10-CM | POA: Diagnosis not present

## 2015-01-25 NOTE — Therapy (Signed)
[address] [place], [place], [address] Phone: [phone]   Fax:  [phone]  Physical Therapy Treatment  Patient Details  Name: [name] [name] MRN: [id number] Date of Birth: [birth year] Referring Provider:  Mcarthur Rossetti*  Encounter Date: 01/25/2015      PT End of Session - 01/25/15 1733    Visit Number 6   Number of Visits 16   PT Start Time 9798   PT Stop Time 1740   PT Time Calculation (min) 48 min      Past Medical History  Diagnosis Date  . CKD III-IV     a. since transplant in 2001 with significant worsening since 06/2013.  Marland Kitchen Hypertension     a. Long h/o HTN, came off of all meds following transplant/wt loss-->meds resumed 06/2013, difficult to control since.  Marland Kitchen History of stroke   . Hypercalcemia     a. 06/2013 Hospitalized in [place].  Marland Kitchen Heart transplanted     a. 1995 CABG x 4 in [place], [place];  b. 2001 Developed CHF;  c. 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 10/2000 Cardiac transplant @ [facility], [place];  e. Historically nl Bx and stress testing.  Last stress test ~ 5 yrs ago.  Marland Kitchen Hernia of abdominal wall   . Hyperlipidemia   . Orthostatic hypotension     a. Has tried support hose and compression stockings - prefers not to wear.  . Coronary artery disease   . Myocardial infarction     PRIOR TO HEART TRANSPLANT  . Shortness of breath     EXERTION  . Orthostatic dizziness   . Stroke 06/2013    NO RESIDUAL - WAS CONFUSED AT TIME - THAT HAS RESOLVED  . Diabetes mellitus without complication     FASTING CBG 90S  . Headache(784.0)   . Cancer     SKIN  . Anemia   . History of blood transfusion   . Dementia     mild    Past Surgical History  Procedure Laterality Date  . Cardiac surgery    . Heart transplant    . Cataract extraction    . Vasectomy    . Skin graft full thickness leg      cat scratch that did not heal  . Coronary artery bypass graft    . Eye surgery Bilateral      CATARACTS  . Bascilic vein transposition Left 07/06/2014    Procedure: [place];  Surgeon: Rosetta Posner, MD;  Location: [place];  Service: Vascular;  Laterality: Left;    There were no vitals taken for this visit.  Visit Diagnosis:  Difficulty walking  Debility      Subjective Assessment - 01/25/15 1653    Symptoms Still no falls, His wife does try to limit him on activity only due to the fact that he has had so many falls, she does report that she let him use the vacuum over the weekend without diff   Pertinent History on dialysis, numerous falls, debility   Limitations Standing;Walking;House hold activities   How long can you stand comfortably? 2 mins   How long can you walk comfortably? 100 feet   Patient Stated Goals no falls   Currently in Pain? No/denies                    [facility] Adult PT Treatment/Exercise - 01/25/15 0001    Transfers   Sit to  Stand --  worked at higher level no hands sit to stand   Ambulation/Gait   Ambulation/Gait Assistance 5: Supervision   Ambulation Distance (Feet) 300 Feet   Assistive device Straight cane   High Level Balance   High Level Balance Activities Side stepping;Backward walking;Direction changes;Turns;Head turns;Tandem walking   High Level Balance Comments ball tosses, some airex activities   Knee/Hip Exercises: Aerobic   Stationary Bike Nu Step level 5  6 minutes   Knee/Hip Exercises: Machines for Strengthening   Cybex Knee Extension 10# 2x15   Cybex Knee Flexion 25# 2x15   Cybex Leg Press 30# 3x10   Knee/Hip Exercises: Standing   Other Standing Knee Exercises 4 way hip                     PT Long Term Goals - 01/20/15 1714    PT LONG TERM GOAL #1   Status On-going   PT LONG TERM GOAL #2   Status On-going   PT LONG TERM GOAL #3   Status On-going   PT LONG TERM GOAL #4   Status On-going               Plan - 01/25/15 1733    Clinical Impression Statement Continues to  improve, still weak with standing, did well with SPC today   Pt will benefit from skilled therapeutic intervention in order to improve on the following deficits Abnormal gait;Cardiopulmonary status limiting activity;Decreased activity tolerance;Decreased balance;Decreased endurance;Decreased safety awareness;Decreased strength;Difficulty walking;Pain   Rehab Potential Good   PT Frequency 2x / week   PT Duration 8 weeks   PT Treatment/Interventions Electrical Stimulation;Ultrasound;Moist Heat;Gait training;Functional mobility training;Balance training;Neuromuscular re-education;Therapeutic exercise;Therapeutic activities;Patient/family education   PT Next Visit Plan Continue to strengthen. and work on balance        Problem List Patient Active Problem List   Diagnosis Date Noted  . Subdural hematoma 12/26/2014  . Diabetes type 2, controlled 11/01/2014  . Knee pain, right 10/21/2014  . ESRD on dialysis 10/12/2014  . Loculated pleural effusion 10/06/2014  . Weakness generalized 10/06/2014  . Hearing impaired 10/06/2014  . DM (diabetes mellitus), type 2 with renal complications 33/83/2919  . Hypothyroidism 10/06/2014  . Hypertension 10/05/2014  . Hypertensive crisis 10/05/2014  . End stage renal disease 08/04/2014  . Near syncope 07/06/2014  . Malignant hypertension 06/21/2014  . Heart transplanted   . Hypertensive heart disease   . CKD III-IV   . Orthostatic hypotension   . Acute on chronic renal failure 06/20/2014  . Hypertensive chronic kidney disease 06/20/2014  . Type II or unspecified type diabetes mellitus with unspecified complication, uncontrolled 06/20/2014  . Hypothyroidism 06/20/2014  . Hypercholesterolemia 06/20/2014  . CMV (cytomegalovirus infection) status positive 06/20/2014    Sumner Boast, PT 01/25/2015, 5:43 PM  [address] [address] [place], [place], [address] Phone: [phone]   Fax:   [phone]

## 2015-01-27 ENCOUNTER — Ambulatory Visit: Payer: Medicare HMO | Admitting: Physical Therapy

## 2015-02-01 ENCOUNTER — Encounter: Payer: Self-pay | Admitting: Physical Therapy

## 2015-02-01 ENCOUNTER — Ambulatory Visit: Payer: Medicare HMO | Admitting: Physical Therapy

## 2015-02-01 DIAGNOSIS — R262 Difficulty in walking, not elsewhere classified: Secondary | ICD-10-CM

## 2015-02-01 DIAGNOSIS — R5381 Other malaise: Secondary | ICD-10-CM

## 2015-02-01 DIAGNOSIS — I951 Orthostatic hypotension: Secondary | ICD-10-CM | POA: Diagnosis not present

## 2015-02-01 NOTE — Therapy (Signed)
[address] [place], [place], [address] Phone: [phone]   Fax:  [phone]  Physical Therapy Treatment  Patient Details  Name: [name] [name] MRN: [id number] Date of Birth: 10-28-1941 Referring Provider:  Mcarthur Rossetti*  Encounter Date: 02/01/2015      PT End of Session - 02/01/15 1147    Visit Number 7   Number of Visits 16   PT Start Time 1100   PT Stop Time 1027   PT Time Calculation (min) 45 min      Past Medical History  Diagnosis Date  . CKD III-IV     a. since transplant in 2001 with significant worsening since 06/2013.  Marland Kitchen Hypertension     a. Long h/o HTN, came off of all meds following transplant/wt loss-->meds resumed 06/2013, difficult to control since.  Marland Kitchen History of stroke   . Hypercalcemia     a. 06/2013 Hospitalized in [place].  Marland Kitchen Heart transplanted     a. 1995 CABG x 4 in [place], [place];  b. 2001 Developed CHF;  c. 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 10/2000 Cardiac transplant @ [facility], [place];  e. Historically nl Bx and stress testing.  Last stress test ~ 5 yrs ago.  Marland Kitchen Hernia of abdominal wall   . Hyperlipidemia   . Orthostatic hypotension     a. Has tried support hose and compression stockings - prefers not to wear.  . Coronary artery disease   . Myocardial infarction     PRIOR TO HEART TRANSPLANT  . Shortness of breath     EXERTION  . Orthostatic dizziness   . Stroke 06/2013    NO RESIDUAL - WAS CONFUSED AT TIME - THAT HAS RESOLVED  . Diabetes mellitus without complication     FASTING CBG 90S  . Headache(784.0)   . Cancer     SKIN  . Anemia   . History of blood transfusion   . Dementia     mild    Past Surgical History  Procedure Laterality Date  . Cardiac surgery    . Heart transplant    . Cataract extraction    . Vasectomy    . Skin graft full thickness leg      cat scratch that did not heal  . Coronary artery bypass graft    . Eye surgery  Bilateral     CATARACTS  . Bascilic vein transposition Left 07/06/2014    Procedure: [place];  Surgeon: Rosetta Posner, MD;  Location: [place];  Service: Vascular;  Laterality: Left;    There were no vitals filed for this visit.  Visit Diagnosis:  Difficulty walking  Debility      Subjective Assessment - 02/01/15 1115    Symptoms No falls, I am feeling a lot better.   Pertinent History on dialysis, numerous falls, debility   Limitations Standing;Walking;House hold activities   How long can you stand comfortably? 2 mins   How long can you walk comfortably? 300 feet   Patient Stated Goals no falls   Currently in Pain? No/denies   Pain Onset More than a month ago                       [facility] Adult PT Treatment/Exercise - 02/01/15 0001    Transfers   Transfers --  worked on floor to sitting   Ambulation/Gait   Ambulation/Gait Assistance 5: Runner, broadcasting/film/video (  Feet) 350 Feet  did this around building 2 x after rest   Assistive device 4-wheeled walker   Ambulation Surface Outdoor   Knee/Hip Exercises: Aerobic   Stationary Bike Nu Step level 6  6 minutes   Knee/Hip Exercises: Machines for Strengthening   Cybex Knee Extension 10# 2x15   Cybex Knee Flexion 25# 2x15   Cybex Leg Press 30# 3x10                     PT Long Term Goals - 01/20/15 1714    PT LONG TERM GOAL #1   Status On-going   PT LONG TERM GOAL #2   Status On-going   PT LONG TERM GOAL #3   Status On-going   PT LONG TERM GOAL #4   Status On-going               Plan - 02/01/15 1148    Clinical Impression Statement short of breath going up slope, with working on floor to stand transfer we found that his gluteus medius is very weak and had difficulty getting up on to knees   Pt will benefit from skilled therapeutic intervention in order to improve on the following deficits Abnormal gait;Cardiopulmonary status limiting activity;Decreased activity  tolerance;Decreased balance;Decreased endurance;Decreased safety awareness;Decreased strength;Difficulty walking;Pain   Rehab Potential Good   PT Frequency 2x / week   PT Duration 6 weeks   PT Treatment/Interventions Electrical Stimulation;Ultrasound;Moist Heat;Gait training;Functional mobility training;Balance training;Neuromuscular re-education;Therapeutic exercise;Therapeutic activities;Patient/family education   PT Next Visit Plan Continue to strengthen. and work on Chartered loss adjuster with Plan of Care Patient;Family member/caregiver   Family Member Consulted wife        Problem List Patient Active Problem List   Diagnosis Date Noted  . Subdural hematoma 12/26/2014  . Diabetes type 2, controlled 11/01/2014  . Knee pain, right 10/21/2014  . ESRD on dialysis 10/12/2014  . Loculated pleural effusion 10/06/2014  . Weakness generalized 10/06/2014  . Hearing impaired 10/06/2014  . DM (diabetes mellitus), type 2 with renal complications 62/26/3335  . Hypothyroidism 10/06/2014  . Hypertension 10/05/2014  . Hypertensive crisis 10/05/2014  . End stage renal disease 08/04/2014  . Near syncope 07/06/2014  . Malignant hypertension 06/21/2014  . Heart transplanted   . Hypertensive heart disease   . CKD III-IV   . Orthostatic hypotension   . Acute on chronic renal failure 06/20/2014  . Hypertensive chronic kidney disease 06/20/2014  . Type II or unspecified type diabetes mellitus with unspecified complication, uncontrolled 06/20/2014  . Hypothyroidism 06/20/2014  . Hypercholesterolemia 06/20/2014  . CMV (cytomegalovirus infection) status positive 06/20/2014    Sumner Boast, PT 02/01/2015, 11:49 AM  [address] [place], [place], [address] Phone: [phone]   Fax:  [phone]

## 2015-02-03 ENCOUNTER — Ambulatory Visit: Payer: Medicare HMO | Admitting: Physical Therapy

## 2015-02-10 ENCOUNTER — Ambulatory Visit (INDEPENDENT_AMBULATORY_CARE_PROVIDER_SITE_OTHER): Payer: Medicare HMO | Admitting: Podiatry

## 2015-02-10 ENCOUNTER — Encounter: Payer: Self-pay | Admitting: Podiatry

## 2015-02-10 VITALS — BP 162/68 | HR 82 | Resp 12

## 2015-02-10 DIAGNOSIS — B351 Tinea unguium: Secondary | ICD-10-CM | POA: Diagnosis not present

## 2015-02-10 DIAGNOSIS — M79676 Pain in unspecified toe(s): Secondary | ICD-10-CM | POA: Diagnosis not present

## 2015-02-10 NOTE — Patient Instructions (Signed)
Diabetes and Foot Care Diabetes may cause you to have problems because of poor blood supply (circulation) to your feet and legs. This may cause the skin on your feet to become thinner, break easier, and heal more slowly. Your skin may become dry, and the skin may peel and crack. You may also have nerve damage in your legs and feet causing decreased feeling in them. You may not notice minor injuries to your feet that could lead to infections or more serious problems. Taking care of your feet is one of the most important things you can do for yourself.  HOME CARE INSTRUCTIONS  Wear shoes at all times, even in the house. Do not go barefoot. Bare feet are easily injured.  Check your feet daily for blisters, cuts, and redness. If you cannot see the bottom of your feet, use a mirror or ask someone for help.  Wash your feet with warm water (do not use hot water) and mild soap. Then pat your feet and the areas between your toes until they are completely dry. Do not soak your feet as this can dry your skin.  Apply a moisturizing lotion or petroleum jelly (that does not contain alcohol and is unscented) to the skin on your feet and to dry, brittle toenails. Do not apply lotion between your toes.  Trim your toenails straight across. Do not dig under them or around the cuticle. File the edges of your nails with an emery board or nail file.  Do not cut corns or calluses or try to remove them with medicine.  Wear clean socks or stockings every day. Make sure they are not too tight. Do not wear knee-high stockings since they may decrease blood flow to your legs.  Wear shoes that fit properly and have enough cushioning. To break in new shoes, wear them for just a few hours a day. This prevents you from injuring your feet. Always look in your shoes before you put them on to be sure there are no objects inside.  Do not cross your legs. This may decrease the blood flow to your feet.  If you find a minor scrape,  cut, or break in the skin on your feet, keep it and the skin around it clean and dry. These areas may be cleansed with mild soap and water. Do not cleanse the area with peroxide, alcohol, or iodine.  When you remove an adhesive bandage, be sure not to damage the skin around it.  If you have a wound, look at it several times a day to make sure it is healing.  Do not use heating pads or hot water bottles. They may burn your skin. If you have lost feeling in your feet or legs, you may not know it is happening until it is too late.  Make sure your health care provider performs a complete foot exam at least annually or more often if you have foot problems. Report any cuts, sores, or bruises to your health care provider immediately. SEEK MEDICAL CARE IF:   You have an injury that is not healing.  You have cuts or breaks in the skin.  You have an ingrown nail.  You notice redness on your legs or feet.  You feel burning or tingling in your legs or feet.  You have pain or cramps in your legs and feet.  Your legs or feet are numb.  Your feet always feel cold. SEEK IMMEDIATE MEDICAL CARE IF:   There is increasing redness,   swelling, or pain in or around a wound.  There is a red line that goes up your leg.  Pus is coming from a wound.  You develop a fever or as directed by your health care provider.  You notice a bad smell coming from an ulcer or wound. Document Released: 11/03/2000 Document Revised: 07/09/2013 Document Reviewed: 04/15/2013 ExitCare Patient Information 2015 [employer]. This information is not intended to replace advice given to you by your health care provider. Make sure you discuss any questions you have with your health care provider.  

## 2015-02-10 NOTE — Progress Notes (Signed)
   Subjective:    Patient ID: [name] [name], male    DOB: 31-Aug-1941, 74 y.o.   MRN: [id number]  HPI  N-SORE L-B/L TOENAILS D-LONG-TERM O-SLOWLY C-LONGER A-PRESSURE T-TRIM  Patient has recent podiatric care, however, patient's insurance would not provide surfaces and patient is [facility]  Patient denies any history of claudication or foot ulcerations  Review of Systems  All other systems reviewed and are negative.      Objective:   Physical Exam  Orientated 3 hard hearing patient presents with his wife present in the room  Vascular: DP pulse 1/4 bilaterally PT pulses 1/4 bilaterally  Neurological: Vibratory sensation nonreactive bilaterally Ankle reflex reactive bilaterally Sensation to 10 g monofilament wire intact 5/5 right and 4/5 left  Dermatological: Atrophic skin without any hair growth bilaterally The toenails are incurvated, elongated, discolored and tender to palpation 6-10  Musculoskeletal: Patient walks slowly with a roller walker Taylor's bunion bilaterally Hammertoe deformities 2-4 right Patient states that he wears a 5/8 inch full heel and sole left to control back pain There is no obvious limb length discrepancy noted         Assessment & Plan:   Assessment: Type II diabetic Decrease pedal pulses suggestive of diabetic peripheral arterial disease Gait disturbance Symptomatic onychomycoses 6-10  Plan: Debridement toenails 10 without a bleeding Referred patient to shoe repair to install a 5/8 inch full heel and sole lift on the right shoe) patient has used this shoe modification for long period of time and is requesting continuing this shoe modification)

## 2015-02-15 ENCOUNTER — Ambulatory Visit: Payer: Medicare HMO | Admitting: Physical Therapy

## 2015-02-17 ENCOUNTER — Ambulatory Visit: Payer: Medicare HMO | Admitting: Physical Therapy

## 2015-02-18 ENCOUNTER — Telehealth: Payer: Self-pay

## 2015-02-18 ENCOUNTER — Ambulatory Visit: Payer: Medicare HMO | Admitting: Physical Therapy

## 2015-02-18 NOTE — Telephone Encounter (Signed)
PATIENT WANTS TO BE DISCHARGED

## 2015-03-22 ENCOUNTER — Ambulatory Visit (INDEPENDENT_AMBULATORY_CARE_PROVIDER_SITE_OTHER): Payer: 59 | Admitting: Gastroenterology

## 2015-03-22 ENCOUNTER — Encounter: Payer: Self-pay | Admitting: Gastroenterology

## 2015-03-22 VITALS — BP 204/90 | HR 64 | Ht 74.0 in | Wt 145.0 lb

## 2015-03-22 DIAGNOSIS — R195 Other fecal abnormalities: Secondary | ICD-10-CM | POA: Diagnosis not present

## 2015-03-22 DIAGNOSIS — Z941 Heart transplant status: Secondary | ICD-10-CM | POA: Diagnosis not present

## 2015-03-22 DIAGNOSIS — N184 Chronic kidney disease, stage 4 (severe): Secondary | ICD-10-CM

## 2015-03-22 DIAGNOSIS — Z8719 Personal history of other diseases of the digestive system: Secondary | ICD-10-CM | POA: Diagnosis not present

## 2015-03-22 DIAGNOSIS — K2270 Barrett's esophagus without dysplasia: Secondary | ICD-10-CM | POA: Diagnosis not present

## 2015-03-22 NOTE — Assessment & Plan Note (Signed)
Patient has chronic renal disease but overall is medically stable.

## 2015-03-22 NOTE — Assessment & Plan Note (Signed)
Heme positive stool could reflect inflammation from IBD.  Occult bleeding from polyps, AVMs and neoplasm are other considerations.  Recommendations #1 colonoscopy

## 2015-03-22 NOTE — Progress Notes (Signed)
_                                                                                                                History of Present Illness:  Mr. Oaxaca is a 74 year old white male with history of heart transplant, end-stage renal disease on dialysis, type 2 diabetes, Barrett's esophagus, referred at the request of Dr. Jonni Sanger for evaluation of Hemoccult-positive stools.  This was noted on routine testing.  2 months ago hemoglobin was 9.9 per with baseline 11.2.  He takes iron.  MCV is normal.  He has a history of Barrett's esophagus and was last examined over 2 years ago.  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  The actual colonoscopy report is not available.  He complains of intermittent diarrhea that may last for a week or 2 but is well-controlled with Imodium.  No history of overt rectal bleeding.  He denies dysphagia but does have occasional coughing when he swallows.  He is without abdominal pain.  He complains of rectal soreness.   Past Medical History  Diagnosis Date  . CKD III-IV     a. since transplant in 2001 with significant worsening since 06/2013.  Marland Kitchen Hypertension     a. Long h/o HTN, came off of all meds following transplant/wt loss-->meds resumed 06/2013, difficult to control since.  Marland Kitchen History of stroke   . Hypercalcemia     a. 06/2013 Hospitalized in [place].  Marland Kitchen Heart transplanted     a. 1995 CABG x 4 in [place], [place];  b. 2001 Developed CHF;  c. 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 10/2000 Cardiac transplant @ [facility], [place];  e. Historically nl Bx and stress testing.  Last stress test ~ 5 yrs ago.  Marland Kitchen Hernia of abdominal wall   . Hyperlipidemia   . Orthostatic hypotension     a. Has tried support hose and compression stockings - prefers not to wear.  . Coronary artery disease   . Myocardial infarction     PRIOR TO HEART TRANSPLANT  . Shortness of breath     EXERTION  . Orthostatic  dizziness   . Stroke 06/2013    NO RESIDUAL - WAS CONFUSED AT TIME - THAT HAS RESOLVED  . Diabetes mellitus without complication     FASTING CBG 90S  . Headache(784.0)   . Cancer     SKIN  . Anemia   . History of blood transfusion   . Dementia     mild   Past Surgical History  Procedure Laterality Date  . Cardiac surgery    . Heart transplant    . Cataract extraction    . Vasectomy    . Skin graft full thickness leg      cat scratch that did not heal  . Coronary artery bypass graft    . Eye surgery Bilateral     CATARACTS  . Bascilic vein transposition Left 07/06/2014    Procedure: BASCILIC  VEIN TRANSPOSITION;  Surgeon: Rosetta Posner, MD;  Location: [facility] OR;  Service: Vascular;  Laterality: Left;   family history includes Diabetes in his father and mother; Heart attack in his father and mother; Kidney disease in his maternal aunt. Current Outpatient Prescriptions  Medication Sig Dispense Refill  . acetaminophen (TYLENOL) 325 MG tablet Take 2 tablets (650 mg total) by mouth every 6 (six) hours as needed for mild pain (or Fever >/= 101).    . cholecalciferol (VITAMIN D) 1000 UNITS tablet Take 1,000 Units by mouth daily.    . cycloSPORINE modified (NEORAL) 25 MG capsule Take 75 mg by mouth 3 (three) times daily.     . diphenoxylate-atropine (LOMOTIL) 2.5-0.025 MG per tablet Take 1 tablet by mouth.    . donepezil (ARICEPT) 10 MG tablet Take 5 mg by mouth.    . ferrous sulfate 325 (65 FE) MG tablet Take 325 mg by mouth 3 (three) times daily with meals.    Marland Kitchen FLUoxetine (PROZAC) 20 MG capsule Take 20 mg by mouth daily with breakfast.    . lansoprazole (PREVACID) 15 MG capsule Take 15 mg by mouth daily.     Marland Kitchen levothyroxine (SYNTHROID, LEVOTHROID) 75 MCG tablet Take 75 mcg by mouth daily before breakfast.    . metoprolol tartrate (LOPRESSOR) 25 MG tablet Take 2 tablets (50 mg total) by mouth 2 (two) times daily. 120 tablet 0  . multivitamin (RENA-VIT) TABS tablet   5  . mycophenolate  (CELLCEPT) 250 MG capsule Take 250 mg by mouth daily.     . pravastatin (PRAVACHOL) 10 MG tablet Take 10 mg by mouth daily with breakfast.    . vitamin B-12 (CYANOCOBALAMIN) 1000 MCG tablet Take 1,000 mcg by mouth daily with breakfast.     No current facility-administered medications for this visit.   Allergies as of 03/22/2015 - Review Complete 03/22/2015  Allergen Reaction Noted  . Norvasc [amlodipine besylate] Swelling and Other (See Comments) 06/20/2014    reports that he has never smoked. He has never used smokeless tobacco. He reports that he does not drink alcohol or use illicit drugs.   Review of Systems: Pertinent positive and negative review of systems were noted in the above HPI section. All other review of systems were otherwise negative.  Vital signs were reviewed in today's medical record Physical Exam: General: Chronically ill-appearing male in no acute distress Skin: anicteric Head: Normocephalic and atraumatic Eyes:  sclerae anicteric, EOMI Ears: Normal auditory acuity Mouth: No deformity or lesions Neck: Supple, no masses or thyromegaly Lymph Nodes: no lymphadenopathy Lungs: Clear throughout to auscultation Heart: Regular rate and rhythm; no murmurs, rubs or bruits Gastroinestinal: Soft, non tender and non distended. No masses, hepatosplenomegaly noted. Normal Bowel sounds.  There is a small midline hernia Rectal:deferred Musculoskeletal: Symmetrical with no gross deformities  Skin: No lesions on visible extremities Pulses:  Normal pulses noted Extremities: No clubbing, cyanosis, edema or deformities noted Neurological: Alert oriented x 4, grossly nonfocal Cervical Nodes:  No significant cervical adenopathy Inguinal Nodes: No significant inguinal adenopathy Psychological:  Alert and cooperative. Normal mood and affect  See Assessment and Plan under Problem List

## 2015-03-22 NOTE — Assessment & Plan Note (Signed)
Patient complains of intermittent diarrhea but no bleeding.  Diagnosis was provided by a biopsy report.

## 2015-03-22 NOTE — Patient Instructions (Signed)
You have been scheduled for an endoscopy and colonoscopy. Please follow the written instructions given to you at your visit today. Please pick up your prep supplies at the pharmacy within the next 1-3 days. If you use inhalers (even only as needed), please bring them with you on the day of your procedure. Your physician has requested that you go to www.startemmi.com and enter the access code given to you at your visit today. This web site gives a general overview about your procedure. However, you should still follow specific instructions given to you by our office regarding your preparation for the procedure.  You are advised to get with your PCP concerning your blood pressure   We have given you a Moviprep kit today

## 2015-03-22 NOTE — Assessment & Plan Note (Addendum)
Plan follow-up endoscopy  Cc Dr. Jonni Sanger

## 2015-03-25 ENCOUNTER — Encounter: Payer: Self-pay | Admitting: Gastroenterology

## 2015-04-05 ENCOUNTER — Encounter: Payer: 59 | Admitting: Gastroenterology

## 2015-04-22 ENCOUNTER — Emergency Department (HOSPITAL_COMMUNITY): Payer: Medicare HMO

## 2015-04-22 ENCOUNTER — Encounter (HOSPITAL_COMMUNITY): Payer: Self-pay | Admitting: Emergency Medicine

## 2015-04-22 ENCOUNTER — Inpatient Hospital Stay (HOSPITAL_COMMUNITY)
Admission: EM | Admit: 2015-04-22 | Discharge: 2015-04-27 | DRG: 689 | Disposition: A | Discharge status: Home / Self Care | Payer: Medicare HMO | Attending: Internal Medicine | Admitting: Internal Medicine

## 2015-04-22 ENCOUNTER — Other Ambulatory Visit (HOSPITAL_COMMUNITY): Payer: Self-pay

## 2015-04-22 DIAGNOSIS — N390 Urinary tract infection, site not specified: Secondary | ICD-10-CM | POA: Diagnosis present

## 2015-04-22 DIAGNOSIS — E7800 Pure hypercholesterolemia, unspecified: Secondary | ICD-10-CM | POA: Diagnosis present

## 2015-04-22 DIAGNOSIS — Z992 Dependence on renal dialysis: Secondary | ICD-10-CM

## 2015-04-22 DIAGNOSIS — Z941 Heart transplant status: Secondary | ICD-10-CM

## 2015-04-22 DIAGNOSIS — B9620 Unspecified Escherichia coli [E. coli] as the cause of diseases classified elsewhere: Secondary | ICD-10-CM | POA: Diagnosis present

## 2015-04-22 DIAGNOSIS — F329 Major depressive disorder, single episode, unspecified: Secondary | ICD-10-CM | POA: Diagnosis present

## 2015-04-22 DIAGNOSIS — R4182 Altered mental status, unspecified: Secondary | ICD-10-CM

## 2015-04-22 DIAGNOSIS — I252 Old myocardial infarction: Secondary | ICD-10-CM

## 2015-04-22 DIAGNOSIS — R531 Weakness: Secondary | ICD-10-CM | POA: Diagnosis not present

## 2015-04-22 DIAGNOSIS — I10 Essential (primary) hypertension: Secondary | ICD-10-CM

## 2015-04-22 DIAGNOSIS — E785 Hyperlipidemia, unspecified: Secondary | ICD-10-CM | POA: Diagnosis present

## 2015-04-22 DIAGNOSIS — Z8673 Personal history of transient ischemic attack (TIA), and cerebral infarction without residual deficits: Secondary | ICD-10-CM

## 2015-04-22 DIAGNOSIS — F32A Depression, unspecified: Secondary | ICD-10-CM | POA: Diagnosis present

## 2015-04-22 DIAGNOSIS — D509 Iron deficiency anemia, unspecified: Secondary | ICD-10-CM | POA: Diagnosis present

## 2015-04-22 DIAGNOSIS — N2581 Secondary hyperparathyroidism of renal origin: Secondary | ICD-10-CM | POA: Diagnosis present

## 2015-04-22 DIAGNOSIS — R509 Fever, unspecified: Secondary | ICD-10-CM

## 2015-04-22 DIAGNOSIS — E43 Unspecified severe protein-calorie malnutrition: Secondary | ICD-10-CM | POA: Diagnosis present

## 2015-04-22 DIAGNOSIS — E119 Type 2 diabetes mellitus without complications: Secondary | ICD-10-CM | POA: Diagnosis present

## 2015-04-22 DIAGNOSIS — Z681 Body mass index (BMI) 19 or less, adult: Secondary | ICD-10-CM

## 2015-04-22 DIAGNOSIS — N186 End stage renal disease: Secondary | ICD-10-CM | POA: Diagnosis present

## 2015-04-22 DIAGNOSIS — K2270 Barrett's esophagus without dysplasia: Secondary | ICD-10-CM | POA: Diagnosis present

## 2015-04-22 DIAGNOSIS — Z951 Presence of aortocoronary bypass graft: Secondary | ICD-10-CM

## 2015-04-22 DIAGNOSIS — D72829 Elevated white blood cell count, unspecified: Secondary | ICD-10-CM | POA: Diagnosis present

## 2015-04-22 DIAGNOSIS — G9341 Metabolic encephalopathy: Secondary | ICD-10-CM | POA: Diagnosis present

## 2015-04-22 DIAGNOSIS — I120 Hypertensive chronic kidney disease with stage 5 chronic kidney disease or end stage renal disease: Secondary | ICD-10-CM | POA: Diagnosis present

## 2015-04-22 DIAGNOSIS — Z9849 Cataract extraction status, unspecified eye: Secondary | ICD-10-CM

## 2015-04-22 DIAGNOSIS — Z888 Allergy status to other drugs, medicaments and biological substances status: Secondary | ICD-10-CM

## 2015-04-22 DIAGNOSIS — F0390 Unspecified dementia without behavioral disturbance: Secondary | ICD-10-CM | POA: Diagnosis present

## 2015-04-22 DIAGNOSIS — E039 Hypothyroidism, unspecified: Secondary | ICD-10-CM | POA: Diagnosis present

## 2015-04-22 DIAGNOSIS — R319 Hematuria, unspecified: Secondary | ICD-10-CM | POA: Diagnosis present

## 2015-04-22 DIAGNOSIS — D638 Anemia in other chronic diseases classified elsewhere: Secondary | ICD-10-CM | POA: Diagnosis present

## 2015-04-22 DIAGNOSIS — G9340 Encephalopathy, unspecified: Secondary | ICD-10-CM | POA: Diagnosis present

## 2015-04-22 LAB — CBC
HCT: 40.1 % (ref 39.0–52.0)
Hemoglobin: 12.7 g/dL — ABNORMAL LOW (ref 13.0–17.0)
MCH: 30.5 pg (ref 26.0–34.0)
MCHC: 31.7 g/dL (ref 30.0–36.0)
MCV: 96.4 fL (ref 78.0–100.0)
Platelets: 156 10*3/uL (ref 150–400)
RBC: 4.16 MIL/uL — ABNORMAL LOW (ref 4.22–5.81)
RDW: 14 % (ref 11.5–15.5)
WBC: 11.9 10*3/uL — ABNORMAL HIGH (ref 4.0–10.5)

## 2015-04-22 LAB — CBG MONITORING, ED: Glucose-Capillary: 130 mg/dL — ABNORMAL HIGH (ref 65–99)

## 2015-04-22 LAB — COMPREHENSIVE METABOLIC PANEL
ALT: 21 U/L (ref 17–63)
ANION GAP: 12 (ref 5–15)
AST: 20 U/L (ref 15–41)
Albumin: 3.7 g/dL (ref 3.5–5.0)
Alkaline Phosphatase: 98 U/L (ref 38–126)
BUN: 14 mg/dL (ref 6–20)
CO2: 32 mmol/L (ref 22–32)
Calcium: 8.8 mg/dL — ABNORMAL LOW (ref 8.9–10.3)
Chloride: 93 mmol/L — ABNORMAL LOW (ref 101–111)
Creatinine, Ser: 3.39 mg/dL — ABNORMAL HIGH (ref 0.61–1.24)
GFR, EST AFRICAN AMERICAN: 19 mL/min — AB (ref >60)
GFR, EST NON AFRICAN AMERICAN: 16 mL/min — AB (ref >60)
GLUCOSE: 138 mg/dL — AB (ref 65–99)
Potassium: 3.5 mmol/L (ref 3.5–5.1)
SODIUM: 137 mmol/L (ref 135–145)
Total Bilirubin: 0.7 mg/dL (ref 0.3–1.2)
Total Protein: 6.8 g/dL (ref 6.5–8.1)

## 2015-04-22 LAB — URINE MICROSCOPIC-ADD ON

## 2015-04-22 LAB — URINALYSIS, ROUTINE W REFLEX MICROSCOPIC
BILIRUBIN URINE: NEGATIVE
GLUCOSE, UA: NEGATIVE mg/dL
Ketones, ur: NEGATIVE mg/dL
Nitrite: NEGATIVE
PH: 7 (ref 5.0–8.0)
Protein, ur: >300 mg/dL — AB
SPECIFIC GRAVITY, URINE: 1.016 (ref 1.005–1.030)
UROBILINOGEN UA: 1 mg/dL (ref 0.0–1.0)

## 2015-04-22 LAB — I-STAT CHEM 8, ED
BUN: 16 mg/dL (ref 6–20)
CHLORIDE: 94 mmol/L — AB (ref 101–111)
Calcium, Ion: 1 mmol/L — ABNORMAL LOW (ref 1.13–1.30)
Creatinine, Ser: 3.3 mg/dL — ABNORMAL HIGH (ref 0.61–1.24)
GLUCOSE: 136 mg/dL — AB (ref 65–99)
HEMATOCRIT: 41 % (ref 39.0–52.0)
HEMOGLOBIN: 13.9 g/dL (ref 13.0–17.0)
Potassium: 3.5 mmol/L (ref 3.5–5.1)
Sodium: 137 mmol/L (ref 135–145)
TCO2: 31 mmol/L (ref 0–100)

## 2015-04-22 LAB — POC OCCULT BLOOD, ED: Fecal Occult Bld: NEGATIVE

## 2015-04-22 MED ORDER — VITAMIN D3 25 MCG (1000 UNIT) PO TABS
1000.0000 [IU] | ORAL_TABLET | Freq: Every day | ORAL | Status: DC
  Administered 2015-04-23 – 2015-04-25 (×3): 1000 [IU] via ORAL
  Filled 2015-04-22 (×4): qty 1

## 2015-04-22 MED ORDER — METOPROLOL TARTRATE 50 MG PO TABS
75.0000 mg | ORAL_TABLET | Freq: Two times a day (BID) | ORAL | Status: DC
  Administered 2015-04-23 – 2015-04-27 (×9): 75 mg via ORAL
  Filled 2015-04-22 (×10): qty 1

## 2015-04-22 MED ORDER — ACETAMINOPHEN 650 MG RE SUPP: 650.0000 mg | Freq: Four times a day (QID) | RECTAL | Status: DC | PRN

## 2015-04-22 MED ORDER — DONEPEZIL HCL 10 MG PO TABS
10.0000 mg | ORAL_TABLET | Freq: Every day | ORAL | Status: DC
  Filled 2015-04-22: qty 1

## 2015-04-22 MED ORDER — DEXTROSE 5 % IV SOLN
1.0000 g | Freq: Once | INTRAVENOUS | Status: AC
  Administered 2015-04-22: 1 g via INTRAVENOUS
  Filled 2015-04-22: qty 10

## 2015-04-22 MED ORDER — FLUOXETINE HCL 20 MG PO CAPS
20.0000 mg | ORAL_CAPSULE | Freq: Every day | ORAL | Status: DC
Start: 2015-04-23 — End: 2015-04-27
  Administered 2015-04-23 – 2015-04-27 (×5): 20 mg via ORAL
  Filled 2015-04-22 (×7): qty 1

## 2015-04-22 MED ORDER — ACETAMINOPHEN 325 MG PO TABS
650.0000 mg | ORAL_TABLET | Freq: Four times a day (QID) | ORAL | Status: DC | PRN
  Administered 2015-04-23 – 2015-04-27 (×3): 650 mg via ORAL
  Filled 2015-04-22 (×3): qty 2

## 2015-04-22 MED ORDER — VITAMIN B-12 1000 MCG PO TABS
1000.0000 ug | ORAL_TABLET | Freq: Every day | ORAL | Status: DC
  Administered 2015-04-23 – 2015-04-27 (×5): 1000 ug via ORAL
  Filled 2015-04-22 (×6): qty 1

## 2015-04-22 MED ORDER — CEFTRIAXONE SODIUM 1 G IJ SOLR
1.0000 g | INTRAMUSCULAR | Status: DC
  Administered 2015-04-23: 1 g via INTRAVENOUS
  Filled 2015-04-22 (×3): qty 10

## 2015-04-22 MED ORDER — RENA-VITE PO TABS
1.0000 | ORAL_TABLET | Freq: Every day | ORAL | Status: DC
  Administered 2015-04-23 – 2015-04-26 (×4): 1 via ORAL
  Filled 2015-04-22 (×5): qty 1

## 2015-04-22 MED ORDER — PRAVASTATIN SODIUM 10 MG PO TABS
10.0000 mg | ORAL_TABLET | Freq: Every day | ORAL | Status: DC
  Administered 2015-04-23 – 2015-04-27 (×5): 10 mg via ORAL
  Filled 2015-04-22 (×6): qty 1

## 2015-04-22 MED ORDER — CYCLOSPORINE MODIFIED (NEORAL) 25 MG PO CAPS
75.0000 mg | ORAL_CAPSULE | Freq: Two times a day (BID) | ORAL | Status: DC
  Administered 2015-04-23 – 2015-04-27 (×10): 75 mg via ORAL
  Filled 2015-04-22 (×11): qty 3

## 2015-04-22 MED ORDER — PANTOPRAZOLE SODIUM 20 MG PO TBEC
20.0000 mg | DELAYED_RELEASE_TABLET | Freq: Every day | ORAL | Status: DC
  Administered 2015-04-23 – 2015-04-27 (×5): 20 mg via ORAL
  Filled 2015-04-22 (×5): qty 1

## 2015-04-22 MED ORDER — HEPARIN SODIUM (PORCINE) 5000 UNIT/ML IJ SOLN
5000.0000 [IU] | Freq: Three times a day (TID) | INTRAMUSCULAR | Status: DC
  Administered 2015-04-23 – 2015-04-27 (×13): 5000 [IU] via SUBCUTANEOUS
  Filled 2015-04-22 (×16): qty 1

## 2015-04-22 MED ORDER — MYCOPHENOLATE MOFETIL 250 MG PO CAPS
500.0000 mg | ORAL_CAPSULE | Freq: Two times a day (BID) | ORAL | Status: DC
Start: 2015-04-22 — End: 2015-04-27
  Administered 2015-04-23 – 2015-04-27 (×9): 500 mg via ORAL
  Filled 2015-04-22 (×11): qty 2

## 2015-04-22 MED ORDER — FERROUS SULFATE 325 (65 FE) MG PO TABS
325.0000 mg | ORAL_TABLET | Freq: Three times a day (TID) | ORAL | Status: DC
  Administered 2015-04-23 – 2015-04-25 (×6): 325 mg via ORAL
  Filled 2015-04-22 (×10): qty 1

## 2015-04-22 MED ORDER — LEVOTHYROXINE SODIUM 75 MCG PO TABS
75.0000 ug | ORAL_TABLET | Freq: Every day | ORAL | Status: DC
  Administered 2015-04-23 – 2015-04-27 (×5): 75 ug via ORAL
  Filled 2015-04-22 (×7): qty 1

## 2015-04-22 MED ORDER — SODIUM CHLORIDE 0.9 % IV SOLN
INTRAVENOUS | Status: DC
  Administered 2015-04-22: 23:00:00 via INTRAVENOUS

## 2015-04-22 MED ORDER — ACETAMINOPHEN 500 MG PO TABS
1000.0000 mg | ORAL_TABLET | Freq: Once | ORAL | Status: AC
  Administered 2015-04-22: 1000 mg via ORAL
  Filled 2015-04-22: qty 2

## 2015-04-22 NOTE — ED Provider Notes (Signed)
CSN: [id number]     Arrival date & time 04/22/15  1824 History   First MD Initiated Contact with Patient 04/22/15 1858     Chief Complaint  Patient presents with  . Weakness     (Consider location/radiation/quality/duration/timing/severity/associated sxs/prior Treatment) The history is provided by the patient, medical records and the spouse. No language interpreter was used.     [name] [name] is a 74 y.o. male  with a hx of ESRD (T, Th, Sat), heart transplant (on cellcept), HTN, CVA 2014, orthostatic hypotension, anemia, mild dementia, NIDDM presents to the Emergency Department complaining of gradual, persistent, progressively worsening generalized weakness and confusion onset this afternoon after dialysis.  [name] reports that he normally walks with a walker and was ambulatory as normal this morning with normal mentation.  She reports that after dialysis they brought him to the car in a wheelchair and upon arriving home, he was unable to get out of the wheelchair.  She reports c/o dysuria and hematuria beginning last night with foul smelling urine.  The dialysis center wrote an Rx for this today, but did not test the urine and they have not had time to fill the Rx.    History obtained from pt's wife.  Level 5 caveat for AMS.     Past Medical History  Diagnosis Date  . CKD III-IV     a. since transplant in 2001 with significant worsening since 06/2013.  Marland Kitchen Hypertension     a. Long h/o HTN, came off of all meds following transplant/wt loss-->meds resumed 06/2013, difficult to control since.  Marland Kitchen History of stroke   . Hypercalcemia     a. 06/2013 Hospitalized in [place].  Marland Kitchen Heart transplanted     a. 1995 CABG x 4 in [place], [place];  b. 2001 Developed CHF;  c. 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 10/2000 Cardiac transplant @ [facility], [place];  e. Historically nl Bx and stress testing.  Last stress test ~ 5 yrs ago.  Marland Kitchen Hernia of abdominal wall   . Hyperlipidemia   . Orthostatic hypotension      a. Has tried support hose and compression stockings - prefers not to wear.  . Coronary artery disease   . Myocardial infarction     PRIOR TO HEART TRANSPLANT  . Shortness of breath     EXERTION  . Orthostatic dizziness   . Stroke 06/2013    NO RESIDUAL - WAS CONFUSED AT TIME - THAT HAS RESOLVED  . Diabetes mellitus without complication     FASTING CBG 90S  . Headache(784.0)   . Cancer     SKIN  . Anemia   . History of blood transfusion   . Dementia     mild   Past Surgical History  Procedure Laterality Date  . Cardiac surgery    . Heart transplant    . Cataract extraction    . Vasectomy    . Skin graft full thickness leg      cat scratch that did not heal  . Coronary artery bypass graft    . Eye surgery Bilateral     CATARACTS  . Bascilic vein transposition Left 07/06/2014    Procedure: Dickinson;  Surgeon: Rosetta Posner, MD;  Location: [facility] OR;  Service: Vascular;  Laterality: Left;   Family History  Problem Relation Age of Onset  . Heart attack Father     died @ 76  . Diabetes Father   . Heart attack Mother   .  Diabetes Mother   . Kidney disease Maternal Aunt    History  Substance Use Topics  . Smoking status: Never Smoker   . Smokeless tobacco: Never Used  . Alcohol Use: No    Review of Systems  Unable to perform ROS: Mental status change      Allergies  Norvasc  Home Medications   Prior to Admission medications   Medication Sig Start Date End Date Taking? Authorizing Provider  acetaminophen (TYLENOL) 325 MG tablet Take 2 tablets (650 mg total) by mouth every 6 (six) hours as needed for mild pain (or Fever >/= 101). 10/14/14   Delfina Redwood, MD  cholecalciferol (VITAMIN D) 1000 UNITS tablet Take 1,000 Units by mouth daily.    Historical Provider, MD  cycloSPORINE modified (NEORAL) 25 MG capsule Take 75 mg by mouth 3 (three) times daily.  08/18/14   Lelon Perla, MD  diphenoxylate-atropine (LOMOTIL) 2.5-0.025 MG per tablet Take 1  tablet by mouth. 03/01/15   Historical Provider, MD  donepezil (ARICEPT) 10 MG tablet Take 5 mg by mouth. 03/01/15   Historical Provider, MD  ferrous sulfate 325 (65 FE) MG tablet Take 325 mg by mouth 3 (three) times daily with meals.    Historical Provider, MD  FLUoxetine (PROZAC) 20 MG capsule Take 20 mg by mouth daily with breakfast.    Historical Provider, MD  lansoprazole (PREVACID) 15 MG capsule Take 15 mg by mouth daily.     Historical Provider, MD  levothyroxine (SYNTHROID, LEVOTHROID) 75 MCG tablet Take 75 mcg by mouth daily before breakfast.    Historical Provider, MD  metoprolol tartrate (LOPRESSOR) 25 MG tablet Take 2 tablets (50 mg total) by mouth 2 (two) times daily. 12/27/14   Ripudeep Krystal Eaton, MD  multivitamin (RENA-VIT) TABS tablet  03/08/15   Historical Provider, MD  mycophenolate (CELLCEPT) 250 MG capsule Take 250 mg by mouth daily.     Historical Provider, MD  pravastatin (PRAVACHOL) 10 MG tablet Take 10 mg by mouth daily with breakfast.    Historical Provider, MD  vitamin B-12 (CYANOCOBALAMIN) 1000 MCG tablet Take 1,000 mcg by mouth daily with breakfast.    Historical Provider, MD   BP 182/71 mmHg  Pulse 79  Temp(Src) 101.8 F (38.8 C) (Rectal)  Resp 23  SpO2 100% Physical Exam  Constitutional: He appears well-developed and well-nourished. No distress.  Awake, alert,  HENT:  Head: Normocephalic and atraumatic.  Mouth/Throat: Oropharynx is clear and moist. No oropharyngeal exudate.  Eyes: Conjunctivae are normal. No scleral icterus.  Neck: Normal range of motion. Neck supple.  Cardiovascular: Normal rate, regular rhythm, normal heart sounds and intact distal pulses.   No murmur heard. Pulmonary/Chest: Effort normal and breath sounds normal. No respiratory distress. He has no wheezes.  Equal chest expansion  Abdominal: Soft. Bowel sounds are normal. He exhibits no distension and no mass. There is no tenderness. There is no rebound and no guarding.  Genitourinary: Testes  normal and penis normal. Right testis shows no mass, no swelling and no tenderness. Right testis is descended. Left testis shows no mass, no swelling and no tenderness. Left testis is descended. Circumcised. No phimosis, paraphimosis, hypospadias, penile erythema or penile tenderness. No discharge found.  Foul smelling urine with some hematuria  Musculoskeletal: Normal range of motion. He exhibits no edema.  Neurological: He is alert.  Speech is clear, but answers are confused  Alert to person only Moves extremities without ataxia  Skin: Skin is warm and dry. He is not  diaphoretic.  Psychiatric: He has a normal mood and affect.  Nursing note and vitals reviewed.   ED Course  Procedures (including critical care time) Labs Review Labs Reviewed  CBC - Abnormal; Notable for the following:    WBC 11.9 (*)    RBC 4.16 (*)    Hemoglobin 12.7 (*)    All other components within normal limits  URINALYSIS, ROUTINE W REFLEX MICROSCOPIC (NOT AT [facility]) - Abnormal; Notable for the following:    Color, Urine RED (*)    APPearance TURBID (*)    Hgb urine dipstick LARGE (*)    Protein, ur >300 (*)    Leukocytes, UA LARGE (*)    All other components within normal limits  COMPREHENSIVE METABOLIC PANEL - Abnormal; Notable for the following:    Chloride 93 (*)    Glucose, Bld 138 (*)    Creatinine, Ser 3.39 (*)    Calcium 8.8 (*)    GFR calc non Af Amer 16 (*)    GFR calc Af Amer 19 (*)    All other components within normal limits  URINE MICROSCOPIC-ADD ON - Abnormal; Notable for the following:    Bacteria, UA MANY (*)    All other components within normal limits  CBG MONITORING, ED - Abnormal; Notable for the following:    Glucose-Capillary 130 (*)    All other components within normal limits  I-STAT CHEM 8, ED - Abnormal; Notable for the following:    Chloride 94 (*)    Creatinine, Ser 3.30 (*)    Glucose, Bld 136 (*)    Calcium, Ion 1.00 (*)    All other components within normal limits   URINE CULTURE  POC OCCULT BLOOD, ED    Imaging Review Dg Chest Port 1 View  04/22/2015   CLINICAL DATA:  Acute onset of altered mental status. Weakness. Initial encounter.  EXAM: PORTABLE CHEST - 1 VIEW  COMPARISON:  Chest radiograph and CT of the chest performed 10/05/2014  FINDINGS: The lungs are well-aerated. Minimal right basilar atelectasis is noted, with mild chronic blunting of the right costophrenic angle. There is no evidence of pleural effusion or pneumothorax.  The cardiomediastinal silhouette is normal in size. The patient is status post median sternotomy. No acute osseous abnormalities are seen.  IMPRESSION: Minimal right basilar atelectasis noted.  Lungs otherwise clear.   Electronically Signed   By: Garald Balding M.D.   On: 04/22/2015 21:15     EKG Interpretation None      MDM   Final diagnoses:  Altered mental status  UTI (lower urinary tract infection)  Fever, unspecified fever cause    Alarik Radu presents with altered mental status and complaints of dysuria and hematuria beginning last night.  Patient febrile on rectal exam. No tachycardia, hypoxia or hypovolemia.  Labs reassuring with mild leukocytosis of 11.9. Urinalysis with large leukocytes and too numerous to count white blood cells along with many bacteria. Chest x-ray without evidence of consolidation. Patient's fever and altered mental status likely secondary to urinary tract infection. Urine culture sent and patient begun on Rocephin. Tylenol given for fever. Patient will need admission. Patient does not meet SIRS or sepsis criteria.  Pt with HTN, wife states this is his normal BP.    10:23 PM Discussed with Dr. Alcario Drought who will admit to med-Surg.  BP 182/71 mmHg  Pulse 79  Temp(Src) 101.8 F (38.8 C) (Rectal)  Resp 23  SpO2 100%   Abigail Butts, PA-C 04/22/15 [place]  Johnney Killian, MD 04/22/15 2235

## 2015-04-22 NOTE — ED Notes (Signed)
Pt arrived by [facility] with c/o weakness. Pt had dialysis today and has UTI but not started on antibiotic yet, wife was going to pick up medication today. Pt was too weak to get out of car so wife called EMS. Pt denies any pain and stated that he just feels bad. Pt also had a heart transplant 14 years ago and started dialysis 8 months ago. BP-188/87 HR-82 CBG-144

## 2015-04-22 NOTE — ED Notes (Signed)
Pt placed in a gown and hooked up to the monitor with the 5 lead, BP cuff and pulse ox 

## 2015-04-22 NOTE — H&P (Signed)
[facility] History and Physical  [name] [name] WYS:[id number] DOB: [birth year] DOA: 04/22/2015  Referring physician: EDP PCP: Leamon Arnt, MD   Chief Complaint: Weakness   HPI: [name] [name] is a 74 y.o. male with h/o ESRD (dialysis TTS), orthotopic heart transplant.  Last dialysis earlier today.  Patient presents to the ED with c/o generalized weakness and confusion following dialysis session earlier today.  He was diagnosed with UTI at dialysis, given script, but hadnt started meds yet.  Review of Systems: Systems reviewed.  As above, otherwise negative  Past Medical History  Diagnosis Date  . CKD III-IV     a. since transplant in 2001 with significant worsening since 06/2013.  Marland Kitchen Hypertension     a. Long h/o HTN, came off of all meds following transplant/wt loss-->meds resumed 06/2013, difficult to control since.  Marland Kitchen History of stroke   . Hypercalcemia     a. 06/2013 Hospitalized in [place].  Marland Kitchen Heart transplanted     a. 1995 CABG x 4 in [place], [place];  b. 2001 Developed CHF;  c. 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 10/2000 Cardiac transplant @ [facility], [place];  e. Historically nl Bx and stress testing.  Last stress test ~ 5 yrs ago.  Marland Kitchen Hernia of abdominal wall   . Hyperlipidemia   . Orthostatic hypotension     a. Has tried support hose and compression stockings - prefers not to wear.  . Coronary artery disease   . Myocardial infarction     PRIOR TO HEART TRANSPLANT  . Shortness of breath     EXERTION  . Orthostatic dizziness   . Stroke 06/2013    NO RESIDUAL - WAS CONFUSED AT TIME - THAT HAS RESOLVED  . Diabetes mellitus without complication     FASTING CBG 90S  . Headache(784.0)   . Cancer     SKIN  . Anemia   . History of blood transfusion   . Dementia     mild   Past Surgical History  Procedure Laterality Date  . Cardiac surgery    . Heart transplant    . Cataract extraction    . Vasectomy    . Skin graft full thickness leg      cat scratch that did  not heal  . Coronary artery bypass graft    . Eye surgery Bilateral     CATARACTS  . Bascilic vein transposition Left 07/06/2014    Procedure: [place];  Surgeon: Rosetta Posner, MD;  Location: [place];  Service: Vascular;  Laterality: Left;   Social History:  reports that he has never smoked. He has never used smokeless tobacco. He reports that he does not drink alcohol or use illicit drugs.  Allergies  Allergen Reactions  . Norvasc [Amlodipine Besylate] Swelling and Other (See Comments)    Swelling of the lower body     Family History  Problem Relation Age of Onset  . Heart attack Father     died @ 58  . Diabetes Father   . Heart attack Mother   . Diabetes Mother   . Kidney disease Maternal Aunt      Prior to Admission medications   Medication Sig Start Date End Date Taking? Authorizing Provider  acetaminophen (TYLENOL) 325 MG tablet Take 2 tablets (650 mg total) by mouth every 6 (six) hours as needed for mild pain (or Fever >/= 101). 10/14/14   Delfina Redwood, MD  cholecalciferol (VITAMIN D) 1000 UNITS tablet Take 1,000 Units  by mouth daily.    Historical Provider, MD  cycloSPORINE modified (NEORAL) 25 MG capsule Take 75 mg by mouth 3 (three) times daily.  08/18/14   Lelon Perla, MD  diphenoxylate-atropine (LOMOTIL) 2.5-0.025 MG per tablet Take 1 tablet by mouth. 03/01/15   Historical Provider, MD  donepezil (ARICEPT) 10 MG tablet Take 5 mg by mouth. 03/01/15   Historical Provider, MD  ferrous sulfate 325 (65 FE) MG tablet Take 325 mg by mouth 3 (three) times daily with meals.    Historical Provider, MD  FLUoxetine (PROZAC) 20 MG capsule Take 20 mg by mouth daily with breakfast.    Historical Provider, MD  lansoprazole (PREVACID) 15 MG capsule Take 15 mg by mouth daily.     Historical Provider, MD  levothyroxine (SYNTHROID, LEVOTHROID) 75 MCG tablet Take 75 mcg by mouth daily before breakfast.    Historical Provider, MD  metoprolol tartrate (LOPRESSOR) 25 MG  tablet Take 2 tablets (50 mg total) by mouth 2 (two) times daily. 12/27/14   Ripudeep Krystal Eaton, MD  multivitamin (RENA-VIT) TABS tablet  03/08/15   Historical Provider, MD  mycophenolate (CELLCEPT) 250 MG capsule Take 250 mg by mouth daily.     Historical Provider, MD  pravastatin (PRAVACHOL) 10 MG tablet Take 10 mg by mouth daily with breakfast.    Historical Provider, MD  vitamin B-12 (CYANOCOBALAMIN) 1000 MCG tablet Take 1,000 mcg by mouth daily with breakfast.    Historical Provider, MD   Physical Exam: Filed Vitals:   04/22/15 2230  BP: 163/65  Pulse:   Temp:   Resp: 18    BP 163/65 mmHg  Pulse 79  Temp(Src) 101.8 F (38.8 C) (Rectal)  Resp 18  SpO2 100%  General Appearance:    Alert, oriented, no distress, appears stated age  Head:    Normocephalic, atraumatic  Eyes:    PERRL, EOMI, sclera non-icteric        Nose:   Nares without drainage or epistaxis. Mucosa, turbinates normal  Throat:   Moist mucous membranes. Oropharynx without erythema or exudate.  Neck:   Supple. No carotid bruits.  No thyromegaly.  No lymphadenopathy.   Back:     No CVA tenderness, no spinal tenderness  Lungs:     Clear to auscultation bilaterally, without wheezes, rhonchi or rales  Chest wall:    No tenderness to palpitation  Heart:    Regular rate and rhythm without murmurs, gallops, rubs  Abdomen:     Soft, non-tender, nondistended, normal bowel sounds, no organomegaly  Genitalia:    deferred  Rectal:    deferred  Extremities:   No clubbing, cyanosis or edema.  Pulses:   2+ and symmetric all extremities  Skin:   Skin color, texture, turgor normal, no rashes or lesions  Lymph nodes:   Cervical, supraclavicular, and axillary nodes normal  Neurologic:   CNII-XII intact. Normal strength, sensation and reflexes      throughout    Labs on Admission:  Basic Metabolic Panel:  Recent Labs Lab 04/22/15 2002 04/22/15 2017  NA 137 137  K 3.5 3.5  CL 93* 94*  CO2 32  --   GLUCOSE 138* 136*  BUN 14  16  CREATININE 3.39* 3.30*  CALCIUM 8.8*  --    Liver Function Tests:  Recent Labs Lab 04/22/15 2002  AST 20  ALT 21  ALKPHOS 98  BILITOT 0.7  PROT 6.8  ALBUMIN 3.7   No results for input(s): LIPASE, AMYLASE in the last  168 hours. No results for input(s): AMMONIA in the last 168 hours. CBC:  Recent Labs Lab 04/22/15 2002 04/22/15 2017  WBC 11.9*  --   HGB 12.7* 13.9  HCT 40.1 41.0  MCV 96.4  --   PLT 156  --    Cardiac Enzymes: No results for input(s): CKTOTAL, CKMB, CKMBINDEX, TROPONINI in the last 168 hours.  BNP (last 3 results)  Recent Labs  10/05/14 1420  PROBNP >70000.0*   CBG:  Recent Labs Lab 04/22/15 2001  GLUCAP 130*    Radiological Exams on Admission: Dg Chest Port 1 View  04/22/2015   CLINICAL DATA:  Acute onset of altered mental status. Weakness. Initial encounter.  EXAM: PORTABLE CHEST - 1 VIEW  COMPARISON:  Chest radiograph and CT of the chest performed 10/05/2014  FINDINGS: The lungs are well-aerated. Minimal right basilar atelectasis is noted, with mild chronic blunting of the right costophrenic angle. There is no evidence of pleural effusion or pneumothorax.  The cardiomediastinal silhouette is normal in size. The patient is status post median sternotomy. No acute osseous abnormalities are seen.  IMPRESSION: Minimal right basilar atelectasis noted.  Lungs otherwise clear.   Electronically Signed   By: Garald Balding M.D.   On: 04/22/2015 21:15    EKG: Independently reviewed.  Assessment/Plan Principal Problem:   UTI (lower urinary tract infection) Active Problems:   Heart transplanted   Hypertension   ESRD on dialysis   1. UTI -  1. Tylenol for fever 2. Recheck CBC in AM 3. Rocephin 4. Cultures pending 2. Heart transplant 1. Continue anti-rejection meds, dosages and frequency was updated and confirmed directly with family at bedside. 3. ESRD - dialysis TTS 1. Call nephrology if patient still in hospital past tomorrow for  Saturday dialysis 2. BMP in AM 4. HTN - continue metoprolol 5. H/o DM - see this in chart but not on any DM meds, will put him on carb mod diet.   Code Status: Full Code  Family Communication: Family at bedside Disposition Plan: Admit to obs   Time spent: 70 min  GARDNER, JARED M. [facility] Pager [phone]  If 7AM-7PM, please contact the day team taking care of the patient [email or web address] Password [facility] 04/22/2015, 10:43 PM

## 2015-04-22 NOTE — Progress Notes (Signed)
ER RN called and gave report. 

## 2015-04-22 NOTE — Progress Notes (Signed)
Rocephin 1g Q24H is appropriate in ESRD.  Pharmacy will sign off.  Wynona Neat, PharmD, BCPS 04/22/2015 10:48 PM

## 2015-04-23 ENCOUNTER — Encounter (HOSPITAL_COMMUNITY): Payer: Self-pay | Admitting: *Deleted

## 2015-04-23 DIAGNOSIS — I252 Old myocardial infarction: Secondary | ICD-10-CM | POA: Diagnosis not present

## 2015-04-23 DIAGNOSIS — N186 End stage renal disease: Secondary | ICD-10-CM

## 2015-04-23 DIAGNOSIS — D638 Anemia in other chronic diseases classified elsewhere: Secondary | ICD-10-CM | POA: Diagnosis present

## 2015-04-23 DIAGNOSIS — Z8673 Personal history of transient ischemic attack (TIA), and cerebral infarction without residual deficits: Secondary | ICD-10-CM | POA: Diagnosis not present

## 2015-04-23 DIAGNOSIS — F329 Major depressive disorder, single episode, unspecified: Secondary | ICD-10-CM | POA: Diagnosis present

## 2015-04-23 DIAGNOSIS — R319 Hematuria, unspecified: Secondary | ICD-10-CM | POA: Diagnosis present

## 2015-04-23 DIAGNOSIS — Z941 Heart transplant status: Secondary | ICD-10-CM | POA: Diagnosis not present

## 2015-04-23 DIAGNOSIS — Z9849 Cataract extraction status, unspecified eye: Secondary | ICD-10-CM | POA: Diagnosis not present

## 2015-04-23 DIAGNOSIS — Z888 Allergy status to other drugs, medicaments and biological substances status: Secondary | ICD-10-CM | POA: Diagnosis not present

## 2015-04-23 DIAGNOSIS — Z992 Dependence on renal dialysis: Secondary | ICD-10-CM

## 2015-04-23 DIAGNOSIS — F32A Depression, unspecified: Secondary | ICD-10-CM | POA: Diagnosis present

## 2015-04-23 DIAGNOSIS — E119 Type 2 diabetes mellitus without complications: Secondary | ICD-10-CM | POA: Diagnosis present

## 2015-04-23 DIAGNOSIS — E780 Pure hypercholesterolemia: Secondary | ICD-10-CM | POA: Diagnosis present

## 2015-04-23 DIAGNOSIS — N390 Urinary tract infection, site not specified: Principal | ICD-10-CM

## 2015-04-23 DIAGNOSIS — G9341 Metabolic encephalopathy: Secondary | ICD-10-CM | POA: Diagnosis present

## 2015-04-23 DIAGNOSIS — K2270 Barrett's esophagus without dysplasia: Secondary | ICD-10-CM | POA: Diagnosis present

## 2015-04-23 DIAGNOSIS — E43 Unspecified severe protein-calorie malnutrition: Secondary | ICD-10-CM

## 2015-04-23 DIAGNOSIS — N2581 Secondary hyperparathyroidism of renal origin: Secondary | ICD-10-CM | POA: Diagnosis present

## 2015-04-23 DIAGNOSIS — F0390 Unspecified dementia without behavioral disturbance: Secondary | ICD-10-CM | POA: Diagnosis present

## 2015-04-23 DIAGNOSIS — B9620 Unspecified Escherichia coli [E. coli] as the cause of diseases classified elsewhere: Secondary | ICD-10-CM | POA: Diagnosis present

## 2015-04-23 DIAGNOSIS — E039 Hypothyroidism, unspecified: Secondary | ICD-10-CM | POA: Diagnosis present

## 2015-04-23 DIAGNOSIS — I120 Hypertensive chronic kidney disease with stage 5 chronic kidney disease or end stage renal disease: Secondary | ICD-10-CM | POA: Diagnosis present

## 2015-04-23 DIAGNOSIS — G9340 Encephalopathy, unspecified: Secondary | ICD-10-CM | POA: Diagnosis present

## 2015-04-23 DIAGNOSIS — R531 Weakness: Secondary | ICD-10-CM | POA: Diagnosis present

## 2015-04-23 DIAGNOSIS — E785 Hyperlipidemia, unspecified: Secondary | ICD-10-CM | POA: Diagnosis present

## 2015-04-23 DIAGNOSIS — D509 Iron deficiency anemia, unspecified: Secondary | ICD-10-CM | POA: Diagnosis present

## 2015-04-23 DIAGNOSIS — Z951 Presence of aortocoronary bypass graft: Secondary | ICD-10-CM | POA: Diagnosis not present

## 2015-04-23 DIAGNOSIS — D72829 Elevated white blood cell count, unspecified: Secondary | ICD-10-CM | POA: Diagnosis present

## 2015-04-23 DIAGNOSIS — Z681 Body mass index (BMI) 19 or less, adult: Secondary | ICD-10-CM | POA: Diagnosis not present

## 2015-04-23 LAB — BASIC METABOLIC PANEL
Anion gap: 14 (ref 5–15)
BUN: 19 mg/dL (ref 6–20)
CHLORIDE: 94 mmol/L — AB (ref 101–111)
CO2: 29 mmol/L (ref 22–32)
Calcium: 8.3 mg/dL — ABNORMAL LOW (ref 8.9–10.3)
Creatinine, Ser: 4.18 mg/dL — ABNORMAL HIGH (ref 0.61–1.24)
GFR calc Af Amer: 15 mL/min — ABNORMAL LOW (ref >60)
GFR, EST NON AFRICAN AMERICAN: 13 mL/min — AB (ref >60)
Glucose, Bld: 92 mg/dL (ref 65–99)
Potassium: 3.8 mmol/L (ref 3.5–5.1)
SODIUM: 137 mmol/L (ref 135–145)

## 2015-04-23 LAB — CBC
HEMATOCRIT: 34.8 % — AB (ref 39.0–52.0)
Hemoglobin: 11 g/dL — ABNORMAL LOW (ref 13.0–17.0)
MCH: 30.8 pg (ref 26.0–34.0)
MCHC: 31.6 g/dL (ref 30.0–36.0)
MCV: 97.5 fL (ref 78.0–100.0)
Platelets: 157 10*3/uL (ref 150–400)
RBC: 3.57 MIL/uL — AB (ref 4.22–5.81)
RDW: 14.4 % (ref 11.5–15.5)
WBC: 10.9 10*3/uL — ABNORMAL HIGH (ref 4.0–10.5)

## 2015-04-23 LAB — MRSA PCR SCREENING: MRSA by PCR: NEGATIVE

## 2015-04-23 LAB — TSH: TSH: 1.056 u[IU]/mL (ref 0.350–4.500)

## 2015-04-23 MED ORDER — NEPRO/CARBSTEADY PO LIQD
237.0000 mL | Freq: Two times a day (BID) | ORAL | Status: DC
  Administered 2015-04-24 – 2015-04-27 (×6): 237 mL via ORAL

## 2015-04-23 MED ORDER — DIPHENOXYLATE-ATROPINE 2.5-0.025 MG PO TABS
1.0000 | ORAL_TABLET | Freq: Once | ORAL | Status: AC
  Administered 2015-04-23: 1 via ORAL
  Filled 2015-04-23: qty 1

## 2015-04-23 NOTE — Consult Note (Signed)
[facility] Renal Consultation Note  Indication for Consultation:  Management of ESRD/hemodialysis; anemia, hypertension/volume and secondary hyperparathyroidism  HPI: [name] [name] is a 74 y.o. male admitted with acute MS changes and in ER ua cw UTI. On TTS  Op HD at [facility] missing txs . / HO CVA/ and dementia/ Card TX 10/2000 [facility] clinic O[facility]. Today tells me "lost my Hearing aide and having some discomfort in my bladder, I'm at [facility] hosp.,today is Sat , the 14th ".    Past Medical History  Diagnosis Date  . CKD III-IV     a. since transplant in 2001 with significant worsening since 06/2013.  Marland Kitchen Hypertension     a. Long h/o HTN, came off of all meds following transplant/wt loss-->meds resumed 06/2013, difficult to control since.  Marland Kitchen History of stroke   . Hypercalcemia     a. 06/2013 Hospitalized in [place].  Marland Kitchen Heart transplanted     a. 1995 CABG x 4 in [place], [place];  b. 2001 Developed CHF;  c. 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 10/2000 Cardiac transplant @ [facility], [place];  e. Historically nl Bx and stress testing.  Last stress test ~ 5 yrs ago.  Marland Kitchen Hernia of abdominal wall   . Hyperlipidemia   . Orthostatic hypotension     a. Has tried support hose and compression stockings - prefers not to wear.  . Coronary artery disease   . Myocardial infarction     PRIOR TO HEART TRANSPLANT  . Shortness of breath     EXERTION  . Orthostatic dizziness   . Stroke 06/2013    NO RESIDUAL - WAS CONFUSED AT TIME - THAT HAS RESOLVED  . Diabetes mellitus without complication     FASTING CBG 90S  . Headache(784.0)   . Cancer     SKIN  . Anemia   . History of blood transfusion   . Dementia     mild    Past Surgical History  Procedure Laterality Date  . Cardiac surgery    . Heart transplant    . Cataract extraction    . Vasectomy    . Skin graft full thickness leg      cat scratch that did not heal  . Coronary artery bypass graft    . Eye surgery Bilateral    CATARACTS  . Bascilic vein transposition Left 07/06/2014    Procedure: [place];  Surgeon: Rosetta Posner, MD;  Location: [facility] OR;  Service: Vascular;  Laterality: Left;      Family History  Problem Relation Age of Onset  . Heart attack Father     died @ 51  . Diabetes Father   . Heart attack Mother   . Diabetes Mother   . Kidney disease Maternal Aunt       reports that he has never smoked. He has never used smokeless tobacco. He reports that he does not drink alcohol or use illicit drugs.   Allergies  Allergen Reactions  . Norvasc [Amlodipine Besylate] Swelling and Other (See Comments)    Swelling of the lower body     Prior to Admission medications   Medication Sig Start Date End Date Taking? Authorizing Provider  acetaminophen (TYLENOL) 325 MG tablet Take 2 tablets (650 mg total) by mouth every 6 (six) hours as needed for mild pain (or Fever >/= 101). 10/14/14   Delfina Redwood, MD  cholecalciferol (VITAMIN D) 1000 UNITS tablet Take 1,000 Units by mouth  daily.    Historical Provider, MD  cycloSPORINE modified (NEORAL) 25 MG capsule Take 75 mg by mouth 3 (three) times daily.  08/18/14   Lelon Perla, MD  diphenoxylate-atropine (LOMOTIL) 2.5-0.025 MG per tablet Take 1 tablet by mouth. 03/01/15   Historical Provider, MD  donepezil (ARICEPT) 10 MG tablet Take 5 mg by mouth. 03/01/15   Historical Provider, MD  ferrous sulfate 325 (65 FE) MG tablet Take 325 mg by mouth 3 (three) times daily with meals.    Historical Provider, MD  FLUoxetine (PROZAC) 20 MG capsule Take 20 mg by mouth daily with breakfast.    Historical Provider, MD  lansoprazole (PREVACID) 15 MG capsule Take 15 mg by mouth daily.     Historical Provider, MD  levothyroxine (SYNTHROID, LEVOTHROID) 75 MCG tablet Take 75 mcg by mouth daily before breakfast.    Historical Provider, MD  metoprolol tartrate (LOPRESSOR) 25 MG tablet Take 2 tablets (50 mg total) by mouth 2 (two) times daily. 12/27/14   Ripudeep  Krystal Eaton, MD  multivitamin (RENA-VIT) TABS tablet  03/08/15   Historical Provider, MD  mycophenolate (CELLCEPT) 250 MG capsule Take 250 mg by mouth daily.     Historical Provider, MD  pravastatin (PRAVACHOL) 10 MG tablet Take 10 mg by mouth daily with breakfast.    Historical Provider, MD  vitamin B-12 (CYANOCOBALAMIN) 1000 MCG tablet Take 1,000 mcg by mouth daily with breakfast.    Historical Provider, MD    Continuous:   Results for orders placed or performed during the hospital encounter of 04/22/15 (from the past 48 hour(s))  Urinalysis, Routine w reflex microscopic (not at [facility])     Status: Abnormal   Collection Time: 04/22/15  7:52 PM  Result Value Ref Range   Color, Urine RED (A) YELLOW    Comment: BIOCHEMICALS MAY BE AFFECTED BY COLOR   APPearance TURBID (A) CLEAR   Specific Gravity, Urine 1.016 1.005 - 1.030   pH 7.0 5.0 - 8.0   Glucose, UA NEGATIVE NEGATIVE mg/dL   Hgb urine dipstick LARGE (A) NEGATIVE   Bilirubin Urine NEGATIVE NEGATIVE   Ketones, ur NEGATIVE NEGATIVE mg/dL   Protein, ur >300 (A) NEGATIVE mg/dL   Urobilinogen, UA 1.0 0.0 - 1.0 mg/dL   Nitrite NEGATIVE NEGATIVE   Leukocytes, UA LARGE (A) NEGATIVE  Urine microscopic-add on     Status: Abnormal   Collection Time: 04/22/15  7:52 PM  Result Value Ref Range   Squamous Epithelial / LPF RARE RARE   WBC, UA TOO NUMEROUS TO COUNT <3 WBC/hpf   RBC / HPF 21-50 <3 RBC/hpf   Bacteria, UA MANY (A) RARE  CBG, ED     Status: Abnormal   Collection Time: 04/22/15  8:01 PM  Result Value Ref Range   Glucose-Capillary 130 (H) 65 - 99 mg/dL  CBC     Status: Abnormal   Collection Time: 04/22/15  8:02 PM  Result Value Ref Range   WBC 11.9 (H) 4.0 - 10.5 K/uL   RBC 4.16 (L) 4.22 - 5.81 MIL/uL   Hemoglobin 12.7 (L) 13.0 - 17.0 g/dL   HCT 40.1 39.0 - 52.0 %   MCV 96.4 78.0 - 100.0 fL   MCH 30.5 26.0 - 34.0 pg   MCHC 31.7 30.0 - 36.0 g/dL   RDW 14.0 11.5 - 15.5 %   Platelets 156 150 - 400 K/uL  Comprehensive metabolic panel      Status: Abnormal   Collection Time: 04/22/15  8:02 PM  Result Value Ref Range   Sodium 137 135 - 145 mmol/L   Potassium 3.5 3.5 - 5.1 mmol/L   Chloride 93 (L) 101 - 111 mmol/L   CO2 32 22 - 32 mmol/L   Glucose, Bld 138 (H) 65 - 99 mg/dL   BUN 14 6 - 20 mg/dL   Creatinine, Ser 3.39 (H) 0.61 - 1.24 mg/dL   Calcium 8.8 (L) 8.9 - 10.3 mg/dL   Total Protein 6.8 6.5 - 8.1 g/dL   Albumin 3.7 3.5 - 5.0 g/dL   AST 20 15 - 41 U/L   ALT 21 17 - 63 U/L   Alkaline Phosphatase 98 38 - 126 U/L   Total Bilirubin 0.7 0.3 - 1.2 mg/dL   GFR calc non Af Amer 16 (L) >60 mL/min   GFR calc Af Amer 19 (L) >60 mL/min    Comment: (NOTE) The eGFR has been calculated using the CKD EPI equation. This calculation has not been validated in all clinical situations. eGFR's persistently <60 mL/min signify possible Chronic Kidney Disease.    Anion gap 12 5 - 15  I-Stat Chem 8, ED  (not at [facility], [facility])     Status: Abnormal   Collection Time: 04/22/15  8:17 PM  Result Value Ref Range   Sodium 137 135 - 145 mmol/L   Potassium 3.5 3.5 - 5.1 mmol/L   Chloride 94 (L) 101 - 111 mmol/L   BUN 16 6 - 20 mg/dL   Creatinine, Ser 3.30 (H) 0.61 - 1.24 mg/dL   Glucose, Bld 136 (H) 65 - 99 mg/dL   Calcium, Ion 1.00 (L) 1.13 - 1.30 mmol/L   TCO2 31 0 - 100 mmol/L   Hemoglobin 13.9 13.0 - 17.0 g/dL   HCT 41.0 39.0 - 52.0 %  POC occult blood, ED RN will collect     Status: None   Collection Time: 04/22/15  9:49 PM  Result Value Ref Range   Fecal Occult Bld NEGATIVE NEGATIVE  CBC     Status: Abnormal   Collection Time: 04/23/15  6:55 AM  Result Value Ref Range   WBC 10.9 (H) 4.0 - 10.5 K/uL   RBC 3.57 (L) 4.22 - 5.81 MIL/uL   Hemoglobin 11.0 (L) 13.0 - 17.0 g/dL    Comment: REPEATED TO VERIFY   HCT 34.8 (L) 39.0 - 52.0 %   MCV 97.5 78.0 - 100.0 fL   MCH 30.8 26.0 - 34.0 pg   MCHC 31.6 30.0 - 36.0 g/dL   RDW 14.4 11.5 - 15.5 %   Platelets 157 150 - 400 K/uL  Basic metabolic panel     Status: Abnormal   Collection  Time: 04/23/15  6:55 AM  Result Value Ref Range   Sodium 137 135 - 145 mmol/L   Potassium 3.8 3.5 - 5.1 mmol/L   Chloride 94 (L) 101 - 111 mmol/L   CO2 29 22 - 32 mmol/L   Glucose, Bld 92 65 - 99 mg/dL   BUN 19 6 - 20 mg/dL   Creatinine, Ser 4.18 (H) 0.61 - 1.24 mg/dL   Calcium 8.3 (L) 8.9 - 10.3 mg/dL   GFR calc non Af Amer 13 (L) >60 mL/min   GFR calc Af Amer 15 (L) >60 mL/min    Comment: (NOTE) The eGFR has been calculated using the CKD EPI equation. This calculation has not been validated in all clinical situations. eGFR's persistently <60 mL/min signify possible Chronic Kidney Disease.    Anion gap 14 5 - 15  MRSA PCR Screening     Status: None   Collection Time: 04/23/15  7:37 AM  Result Value Ref Range   MRSA by PCR NEGATIVE NEGATIVE    Comment:        The GeneXpert MRSA Assay (FDA approved for NASAL specimens only), is one component of a comprehensive MRSA colonization surveillance program. It is not intended to diagnose MRSA infection nor to guide or monitor treatment for MRSA infections.   TSH     Status: None   Collection Time: 04/23/15  2:58 PM  Result Value Ref Range   TSH 1.056 0.350 - 4.500 uIU/mL     ROS: see hpi Not able l to get a complete history of ROS sec to pt.not able to hear.   Physical Exam: Filed Vitals:   04/23/15 0955  BP: 148/69  Pulse: 75  Temp: 98.4 F (36.9 C)  Resp: 17     General: alert WM /HARD OF HEARING / NAD /cooperative with exam HEENT: [place] , MMM, nonicteric Neck: no jvd, supple Heart: RRR, soft 1/6 sem lsb , No rub  Lungs: CTA bilat, non labored breathing Abdomen: BS pos, soft, NT. ND, No discomfort over bladder or flank tenderness Extremities: no  pedal edema  Skin: no overt rash, no pedal ulcers Neuro: alert, OX2 not to date Hard of Hearing ,moves all extrem  Dialysis Access: Pos. Bruit RLU A AVF  Dialysis Orders: Center: [facility]  on TTS . EDW 63.5  HD Bath 2.0 k. 2.25ca   Time 4hr Heparin 2000. Access LUA  AVF  0 esa and 0 vitd/   Venofer  54m weekly hd     Other op labs hgb 11.7   Ca 8.9 phos 5.5 pth 292  Assessment/Plan  1. AMS with UTI 2. ESRD -  TTS HD ( [facility]) 3. Hypertension/volume  - stable bp and vol  Okay below edw ??? wt and cxr no excess vol 4. Anemia  - hgb stable and no esa  On weekly venofer hd/ can dc po fe 5. Metabolic bone disease -   No vit d  6. Nutrition - alb 7. HO [place] 040 36636/01/2015, 5:18 PM   Pt seen, examined and agree w A/P as above. AMS with UTI.  Started HD about 6 mos ago.  Has had some FTT with falls and SDH which has resolved.  Here now with AMS and UTI. Better today already after abx.  For HD today.  RKelly SplinterMD pager [phone]   cell [phone]/02/2015, 1:41 PM

## 2015-04-23 NOTE — Progress Notes (Signed)
Initial Nutrition Assessment  DOCUMENTATION CODES:  Severe malnutrition in context of chronic illness, Underweight  INTERVENTION:  Nepro shake (BID), each supplement provides 425 kcal and 19 grams protein  Encourage adequate PO intake.   NUTRITION DIAGNOSIS:  Malnutrition related to chronic illness as evidenced by percent weight loss, severe depletion of body fat, severe depletion of muscle mass.  GOAL:  Patient will meet greater than or equal to 90% of their needs  MONITOR:  PO intake, Supplement acceptance, Weight trends, Labs, I & O's  REASON FOR ASSESSMENT:   (Low BMI)    ASSESSMENT: Pt with h/o ESRD (dialysis TTS), orthotopic heart transplant.Patient presents to the ED with c/o generalized weakness and confusion following dialysis session. He was diagnosed with UTI at dialysis.  Pt reports having a good appetite currently and PTA at home eating at least 2 meals a day. Current meal completion is 80%. Usual body weight was reported to be ~147 lbs. Per Epic weight records, pt with a 8.3% weight loss in 1 month. Pt was unaware of recent weight loss. Pt is agreeable to Nepro shake to aid in caloric and protein needs as well as to prevent further weight loss. RD to order. Pt was encouraged to eat his food at meals.  Nutrition-Focused physical exam completed. Findings are severe fat depletion, severe muscle depletion, and no edema.   Labs: Low chloride, calcium, and GFR. High creatinine.   Height:  Ht Readings from Last 1 Encounters:  04/23/15 6\' 2"  (1.88 m)    Weight:  Wt Readings from Last 1 Encounters:  04/23/15 133 lb 13.1 oz (60.7 kg)    Ideal Body Weight:  86 kg  Wt Readings from Last 10 Encounters:  04/23/15 133 lb 13.1 oz (60.7 kg)  03/22/15 145 lb (65.772 kg)  12/27/14 139 lb 12.4 oz (63.4 kg)  12/04/14 147 lb (66.679 kg)  10/14/14 145 lb 15.1 oz (66.2 kg)  08/18/14 139 lb 4.8 oz (63.186 kg)  08/04/14 144 lb (65.318 kg)  07/10/14 146 lb 14.4 oz  (66.633 kg)  07/02/14 146 lb 14.4 oz (66.633 kg)  06/25/14 158 lb 6.4 oz (71.85 kg)    BMI:  Body mass index is 17.17 kg/(m^2). Underweight  Adjusted body weight: 69.27 kg  Estimated Nutritional Needs:  Kcal:  2000-2200  Protein:  90-105 grams  Fluid:  1.2 L/day  Skin:  Reviewed, no issues  Diet Order:  Diet renal/carb modified with fluid restriction Diet-HS Snack?: Nothing; Room service appropriate?: Yes; Fluid consistency:: Thin  EDUCATION NEEDS:  No education needs identified at this time   Intake/Output Summary (Last 24 hours) at 04/23/15 1320 Last data filed at 04/23/15 0955  Gross per 24 hour  Intake 758.75 ml  Output      0 ml  Net 758.75 ml    Last BM:  6/1  Kallie Locks, MS, RD, LDN Pager # [phone] After hours/ weekend pager # [phone]

## 2015-04-23 NOTE — Progress Notes (Signed)
Patient ID: [name] [name], male   DOB: May 31, 1941, 74 y.o.   MRN: [id number] [facility] PROGRESS NOTE  Amery Minasyan [id number] DOB: [birth year] DOA: 17-May-2015 PCP: Leamon Arnt, MD  Brief narrative:    74 y.o. male with past medical history of ESRD on HD (TTS), history of heart transplant (takes cyclosporine and mycophenolate), hypertension, hypothyroidism, dyslipidemia who presented to [facility] with acute onset confusion as reported by his wife at the bedside.  On admission, pt was hemodynamically stable. He was found to have UTI based on urinalysis. He was started on empiric rocephin.   Barrier to discharge: Follow up urine culture results. Continue IV rocephin until culture results available. Needs HD tomorrow 6/4. Renal informed.   Assessment/Plan:    Principal Problem: UTI (lower urinary tract infection) / Acute metabolic encephalopathy / Leukocytosis  - UA on admission showed large leukocytes and many bacteria - Started on empiric rocephin - Follow up urine culture results - Per patient's wife, mental status better this am although pt still seems confused and cant recall what brought him to hospital.   Active Problems: ESRD on dialysis - Renal consulted and informed of patient being on HD Tuesday, Thursday, Saturday   Anemia of chronic disease / Iron deficiency anemia  - Secondary to anemia of ERDS - Hemoglobin is 11. - Stable. - Continue iron supplementation.  Essential hypertension - Continue metoprolol 75 mg PO BID  Dyslipidemia - Continue Pravachol   History of heart transplant - Continue Cyclosporine 75 mg PO BID and mycophenolate 500 mg PO BID  Depression - Continue Prozac - Would hold aricept for now because of initial presentation of altered mental status   Hypothyroidism - Continue synthroid  - Check TSH  Protein-calorie malnutrition, severe - In context of chronic illness, ESRD - Nutrition consulted - Continue nutritional  supplementation    DVT Prophylaxis  - Heparin subQ ordered    Code Status: Full.  Family Communication:  plan of care discussed with the patient and his wife at the bedside  Disposition Plan: Home in next 2-3 days once UCx results ara available.   IV access:  Peripheral IV  Procedures and diagnostic studies:    Dg Chest Port 1 View 05/17/15  Minimal right basilar atelectasis noted.  Lungs otherwise clear.      Medical Consultants:  Nephrology  Other Consultants:  None   IAnti-Infectives:   Rocephin 05/17/15 -->   Leisa Lenz, MD  [facility] Pager [phone]  Time spent in minutes: 25 minutes  If 7PM-7AM, please contact night-coverage [email or web address] Password [email or web address] 04/23/2015, 1:42 PM      HPI/Subjective: No acute overnight events. Patient reports feeling better. Still cannot recall what brings him to hospital but he knows he is in [facility].  Objective: Filed Vitals:   05/17/2015 2315 04/23/15 0026 04/23/15 0509 04/23/15 0955  BP: 132/65 148/60 143/65 148/69  Pulse:  68 64 75  Temp:  98.6 F (37 C) 97.7 F (36.5 C) 98.4 F (36.9 C)  TempSrc:  Oral Oral Oral  Resp: 22 12 16 17   Height:  6\' 2"  (1.88 m)    Weight:  60.7 kg (133 lb 13.1 oz)    SpO2:  100% 100% 100%    Intake/Output Summary (Last 24 hours) at 04/23/15 1342 Last data filed at 04/23/15 0955  Gross per 24 hour  Intake 758.75 ml  Output      0 ml  Net 758.75 ml    Exam:   General:  Pt is alert,  not in acute distress  Cardiovascular: Regular rate and rhythm, S1/S2 (+)  Respiratory: Clear to auscultation bilaterally, no wheezing, no crackles, no rhonchi  Abdomen: Soft, non tender, non distended, bowel sounds present  Extremities: No edema, pulses DP and PT palpable bilaterally  Neuro: Grossly nonfocal  Data Reviewed: Basic Metabolic Panel:  Recent Labs Lab 04/22/15 2002 04/22/15 2017 04/23/15 0655  NA 137 137 137  K 3.5 3.5 3.8  CL 93* 94* 94*  CO2 32  --  29  GLUCOSE  138* 136* 92  BUN 14 16 19   CREATININE 3.39* 3.30* 4.18*  CALCIUM 8.8*  --  8.3*   Liver Function Tests:  Recent Labs Lab 04/22/15 2002  AST 20  ALT 21  ALKPHOS 98  BILITOT 0.7  PROT 6.8  ALBUMIN 3.7   No results for input(s): LIPASE, AMYLASE in the last 168 hours. No results for input(s): AMMONIA in the last 168 hours. CBC:  Recent Labs Lab 04/22/15 2002 04/22/15 2017 04/23/15 0655  WBC 11.9*  --  10.9*  HGB 12.7* 13.9 11.0*  HCT 40.1 41.0 34.8*  MCV 96.4  --  97.5  PLT 156  --  157   Cardiac Enzymes: No results for input(s): CKTOTAL, CKMB, CKMBINDEX, TROPONINI in the last 168 hours. BNP: Invalid input(s): POCBNP CBG:  Recent Labs Lab 04/22/15 2001  GLUCAP 130*    Recent Results (from the past 240 hour(s))  MRSA PCR Screening     Status: None   Collection Time: 04/23/15  7:37 AM  Result Value Ref Range Status   MRSA by PCR NEGATIVE NEGATIVE Final     Scheduled Meds: . cefTRIAXone   1 g Intravenous Q24H  . cholecalciferol  1,000 Units Oral Daily  . cycloSPORINE modified  75 mg Oral BID  . donepezil  10 mg Oral QHS  . feeding supplement  237 mL Oral BID BM  . ferrous sulfate  325 mg Oral TID WC  . FLUoxetine  20 mg Oral Q breakfast  . heparin  5,000 Units Subcutaneous 3 times per day  . levothyroxine  75 mcg Oral QAC breakfast  . metoprolol tartrate  75 mg Oral BID  . multivitamin  1 tablet Oral QHS  . mycophenolate  500 mg Oral BID  . pantoprazole  20 mg Oral Daily  . pravastatin  10 mg Oral Q breakfast  . vitamin B-12  1,000 mcg Oral Q breakfast

## 2015-04-24 DIAGNOSIS — G9340 Encephalopathy, unspecified: Secondary | ICD-10-CM

## 2015-04-24 LAB — RENAL FUNCTION PANEL
ANION GAP: 15 (ref 5–15)
Albumin: 3 g/dL — ABNORMAL LOW (ref 3.5–5.0)
BUN: 52 mg/dL — AB (ref 6–20)
CALCIUM: 7.8 mg/dL — AB (ref 8.9–10.3)
CHLORIDE: 92 mmol/L — AB (ref 101–111)
CO2: 24 mmol/L (ref 22–32)
CREATININE: 6.19 mg/dL — AB (ref 0.61–1.24)
GFR calc Af Amer: 9 mL/min — ABNORMAL LOW (ref >60)
GFR calc non Af Amer: 8 mL/min — ABNORMAL LOW (ref >60)
Glucose, Bld: 138 mg/dL — ABNORMAL HIGH (ref 65–99)
PHOSPHORUS: 4.9 mg/dL — AB (ref 2.5–4.6)
POTASSIUM: 4.5 mmol/L (ref 3.5–5.1)
Sodium: 131 mmol/L — ABNORMAL LOW (ref 135–145)

## 2015-04-24 LAB — CBC
HEMATOCRIT: 32.9 % — AB (ref 39.0–52.0)
HEMOGLOBIN: 10.6 g/dL — AB (ref 13.0–17.0)
MCH: 30.5 pg (ref 26.0–34.0)
MCHC: 32.2 g/dL (ref 30.0–36.0)
MCV: 94.5 fL (ref 78.0–100.0)
Platelets: 169 10*3/uL (ref 150–400)
RBC: 3.48 MIL/uL — AB (ref 4.22–5.81)
RDW: 14 % (ref 11.5–15.5)
WBC: 7.1 10*3/uL (ref 4.0–10.5)

## 2015-04-24 MED ORDER — HEPARIN SODIUM (PORCINE) 1000 UNIT/ML DIALYSIS: 1000.0000 [IU] | INTRAMUSCULAR | Status: DC | PRN

## 2015-04-24 MED ORDER — DEXTROSE 5 % IV SOLN
1.0000 g | INTRAVENOUS | Status: DC
  Administered 2015-04-24 – 2015-04-26 (×3): 1 g via INTRAVENOUS
  Filled 2015-04-24 (×3): qty 10

## 2015-04-24 MED ORDER — SODIUM CHLORIDE 0.9 % IV SOLN: 100.0000 mL | INTRAVENOUS | Status: DC | PRN

## 2015-04-24 MED ORDER — HYDRALAZINE HCL 20 MG/ML IJ SOLN
5.0000 mg | Freq: Four times a day (QID) | INTRAMUSCULAR | Status: DC | PRN
  Administered 2015-04-24 – 2015-04-26 (×5): 5 mg via INTRAVENOUS
  Filled 2015-04-24 (×5): qty 1

## 2015-04-24 MED ORDER — LIDOCAINE HCL (PF) 1 % IJ SOLN: 5.0000 mL | INTRAMUSCULAR | Status: DC | PRN

## 2015-04-24 MED ORDER — PENTAFLUOROPROP-TETRAFLUOROETH EX AERO: 1.0000 "application " | INHALATION_SPRAY | CUTANEOUS | Status: DC | PRN

## 2015-04-24 MED ORDER — HEPARIN SODIUM (PORCINE) 1000 UNIT/ML DIALYSIS: 2000.0000 [IU] | INTRAMUSCULAR | Status: DC | PRN

## 2015-04-24 MED ORDER — HYDRALAZINE HCL 10 MG PO TABS
10.0000 mg | ORAL_TABLET | Freq: Three times a day (TID) | ORAL | Status: DC
  Administered 2015-04-24 – 2015-04-25 (×3): 10 mg via ORAL
  Filled 2015-04-24 (×6): qty 1

## 2015-04-24 NOTE — Progress Notes (Signed)
BP- 210/68 map of 104. MD made aware, no new orders given. Pt will receive hemodialysis treatment today.

## 2015-04-24 NOTE — Progress Notes (Signed)
Patient ID: [name] [name], male   DOB: 1941-11-03, 74 y.o.   MRN: [id number] [facility] PROGRESS NOTE  [name] [name] [id number]:[id number] DOB: Mar 07, 1941 DOA: 05-12-15 PCP: Leamon Arnt, MD  Brief narrative:    74 y.o. male with past medical history of ESRD on HD (TTS), history of heart transplant (takes cyclosporine and mycophenolate), hypertension, hypothyroidism, dyslipidemia who presented to [facility] with acute onset confusion as reported by his wife at the bedside.  On admission, pt was hemodynamically stable. He was found to have UTI based on urinalysis. He was started on empiric rocephin.   Barrier to discharge: Follow up urine culture results. Continue IV rocephin until culture results available. Needs HD today 6/4. Renal team consulted.   Assessment/Plan:    Principal Problem: UTI (lower urinary tract infection) / Acute metabolic encephalopathy / Leukocytosis  - UA on admission showed large leukocytes and many bacteria - Started on empiric rocephin and will continue same regimen day #2 - Follow up urine culture results - pt seems at baseline mental status this AM, A&O x 3, follows commands appropriately   Active Problems: ESRD on dialysis - pt on HD TTS, appreciate nephrology team   Anemia of chronic disease / Iron deficiency anemia  - Secondary to anemia of ERDS - Hemoglobin is 11. - Stable. - Continue iron supplementation.  Hypertension, accelerated  - Continue metoprolol 75 mg PO BID - add Hydralazine scheduled and as needed for better BP control as SBP still in 200's  Dyslipidemia - Continue Pravachol   History of heart transplant - Continue Cyclosporine 75 mg PO BID and mycophenolate 500 mg PO BID  Depression - Continue Prozac - Aricept was on hold since admission due to altered mental status - possible to resume in AM if mental status remains clear   Hypothyroidism - Continue synthroid  - TSH is WNL  Protein-calorie malnutrition, severe - In  context of chronic illness, ESRD - Nutrition consulted - Continue nutritional supplementation   DVT Prophylaxis  - Heparin subQ   Code Status: Full.  Family Communication:  plan of care discussed with the patient and his wife at the bedside  Disposition Plan: Home in next 2-3 days once UCx results ara available.   IV access:  Peripheral IV  Procedures and diagnostic studies:    Dg Chest Port 1 View May 12, 2015  Minimal right basilar atelectasis noted.  Lungs otherwise clear.     Medical Consultants:  Nephrology  Other Consultants:  None   IAnti-Infectives:   Rocephin 05-12-15 -->  Faye Ramsay, MD  [facility] Pager [phone]  Time spent in minutes: 25 minutes  If 7PM-7AM, please contact night-coverage [email or web address] Password [email or web address] 04/24/2015, 1:16 PM   LOS: 1 day    HPI/Subjective: No acute overnight events. Patient reports feeling better. A&O, HOH  Objective: Filed Vitals:   04/24/15 0500 04/24/15 0745 04/24/15 0947 04/24/15 1142  BP: 191/88 190/70 194/66 210/68  Pulse: 63 60    Temp: 98.1 F (36.7 C) 98.2 F (36.8 C)    TempSrc: Oral Oral    Resp: 20 16    Height:      Weight: 61.9 kg (136 lb 7.4 oz)     SpO2: 100% 100%      Intake/Output Summary (Last 24 hours) at 04/24/15 1316 Last data filed at 04/24/15 0813  Gross per 24 hour  Intake   1170 ml  Output      0 ml  Net   1170 ml    Exam:  General:  Pt is alert,  not in acute distress  Cardiovascular: Regular rate and rhythm, S1/S2 (+)  Respiratory: Clear to auscultation bilaterally, no wheezing, diminished breath sounds at bases   Abdomen: Soft, non tender, non distended, bowel sounds present  Extremities: pulses DP and PT palpable bilaterally  Neuro: Grossly nonfocal  Data Reviewed: Basic Metabolic Panel:  Recent Labs Lab 04/22/15 2002 04/22/15 2017 04/23/15 0655  NA 137 137 137  K 3.5 3.5 3.8  CL 93* 94* 94*  CO2 32  --  29  GLUCOSE 138* 136* 92  BUN 14 16 19    CREATININE 3.39* 3.30* 4.18*  CALCIUM 8.8*  --  8.3*   Liver Function Tests:  Recent Labs Lab 04/22/15 2002  AST 20  ALT 21  ALKPHOS 98  BILITOT 0.7  PROT 6.8  ALBUMIN 3.7   CBC:  Recent Labs Lab 04/22/15 2002 04/22/15 2017 04/23/15 0655  WBC 11.9*  --  10.9*  HGB 12.7* 13.9 11.0*  HCT 40.1 41.0 34.8*  MCV 96.4  --  97.5  PLT 156  --  157   CBG:  Recent Labs Lab 04/22/15 2001  GLUCAP 130*    Recent Results (from the past 240 hour(s))  MRSA PCR Screening     Status: None   Collection Time: 04/23/15  7:37 AM  Result Value Ref Range Status   MRSA by PCR NEGATIVE NEGATIVE Final     Scheduled Meds: . cefTRIAXone   1 g Intravenous Q24H  . cholecalciferol  1,000 Units Oral Daily  . cycloSPORINE modified  75 mg Oral BID  . donepezil  10 mg Oral QHS  . feeding supplement  237 mL Oral BID BM  . ferrous sulfate  325 mg Oral TID WC  . FLUoxetine  20 mg Oral Q breakfast  . heparin  5,000 Units Subcutaneous 3 times per day  . levothyroxine  75 mcg Oral QAC breakfast  . metoprolol tartrate  75 mg Oral BID  . multivitamin  1 tablet Oral QHS  . mycophenolate  500 mg Oral BID  . pantoprazole  20 mg Oral Daily  . pravastatin  10 mg Oral Q breakfast  . vitamin B-12  1,000 mcg Oral Q breakfast

## 2015-04-25 LAB — BASIC METABOLIC PANEL
Anion gap: 11 (ref 5–15)
BUN: 18 mg/dL (ref 6–20)
CALCIUM: 8.1 mg/dL — AB (ref 8.9–10.3)
CO2: 29 mmol/L (ref 22–32)
Chloride: 94 mmol/L — ABNORMAL LOW (ref 101–111)
Creatinine, Ser: 3.3 mg/dL — ABNORMAL HIGH (ref 0.61–1.24)
GFR calc Af Amer: 20 mL/min — ABNORMAL LOW (ref >60)
GFR calc non Af Amer: 17 mL/min — ABNORMAL LOW (ref >60)
Glucose, Bld: 105 mg/dL — ABNORMAL HIGH (ref 65–99)
Potassium: 3.6 mmol/L (ref 3.5–5.1)
Sodium: 134 mmol/L — ABNORMAL LOW (ref 135–145)

## 2015-04-25 LAB — CBC
HCT: 35 % — ABNORMAL LOW (ref 39.0–52.0)
Hemoglobin: 11.3 g/dL — ABNORMAL LOW (ref 13.0–17.0)
MCH: 30.5 pg (ref 26.0–34.0)
MCHC: 32.3 g/dL (ref 30.0–36.0)
MCV: 94.3 fL (ref 78.0–100.0)
Platelets: 165 10*3/uL (ref 150–400)
RBC: 3.71 MIL/uL — ABNORMAL LOW (ref 4.22–5.81)
RDW: 13.8 % (ref 11.5–15.5)
WBC: 5.8 10*3/uL (ref 4.0–10.5)

## 2015-04-25 LAB — URINE CULTURE

## 2015-04-25 LAB — RENAL FUNCTION PANEL
ALBUMIN: 3.1 g/dL — AB (ref 3.5–5.0)
Anion gap: 11 (ref 5–15)
BUN: 12 mg/dL (ref 6–20)
CHLORIDE: 92 mmol/L — AB (ref 101–111)
CO2: 31 mmol/L (ref 22–32)
Calcium: 7.9 mg/dL — ABNORMAL LOW (ref 8.9–10.3)
Creatinine, Ser: 2.34 mg/dL — ABNORMAL HIGH (ref 0.61–1.24)
GFR, EST AFRICAN AMERICAN: 30 mL/min — AB (ref >60)
GFR, EST NON AFRICAN AMERICAN: 26 mL/min — AB (ref >60)
GLUCOSE: 94 mg/dL (ref 65–99)
Phosphorus: 2.2 mg/dL — ABNORMAL LOW (ref 2.5–4.6)
Potassium: 3.3 mmol/L — ABNORMAL LOW (ref 3.5–5.1)
SODIUM: 134 mmol/L — AB (ref 135–145)

## 2015-04-25 MED ORDER — HYDRALAZINE HCL 25 MG PO TABS
25.0000 mg | ORAL_TABLET | Freq: Three times a day (TID) | ORAL | Status: DC
  Administered 2015-04-25 – 2015-04-26 (×4): 25 mg via ORAL
  Filled 2015-04-25 (×6): qty 1

## 2015-04-25 NOTE — Evaluation (Signed)
Physical Therapy Evaluation Patient Details Name: [name] [name] MRN: [id number] DOB: [birth year] Today's Date: 04/25/2015   History of Present Illness  Patient is a 74 yo male admitted 04/22/15 with AMS.  Patient with UTI.  PMH:  ESRD on HD TTS, heart transplant, HTN, h/o falls   Clinical Impression  Patient presents with problems listed below.  Will benefit from acute PT to maximize functional mobility prior to return home with family.  Patient with decreased balance and h/o falls.  Recommend f/u OP PT at discharge for continued therapy.    Follow Up Recommendations Outpatient PT;Supervision/Assistance - 24 hour    Equipment Recommendations  None recommended by PT    Recommendations for Other Services       Precautions / Restrictions Precautions Precautions: Fall Precaution Comments: H/O falls - 3 in last month per patient/son Restrictions Weight Bearing Restrictions: No      Mobility  Bed Mobility Overal bed mobility: Modified Independent             General bed mobility comments: Use of rail and increased time  Transfers Overall transfer level: Needs assistance Equipment used: Rolling walker (2 wheeled) Transfers: Sit to/from Stand Sit to Stand: Min guard;Min assist         General transfer comment: Verbal cues for hand placement.  Min guard for safety from bed.  Min assist from lower toilet.  Ambulation/Gait Ambulation/Gait assistance: Min assist Ambulation Distance (Feet): 86 Feet Assistive device: Rolling walker (2 wheeled) Gait Pattern/deviations: Step-through pattern;Decreased step length - left;Decreased stride length;Trunk flexed Gait velocity: Initially too fast - unsafe.  Cues to slow speed   General Gait Details: Verbal cues to slow to safe gait speed.  Patient with unsteady gait even with RW.  Moves quickly especially during turns - cues to slow pace and make smaller steps during turns.  Noted LLE striking floor harder with foot flat.  Patient  with leg length discrepency.  Stairs            Wheelchair Mobility    Modified Rankin (Stroke Patients Only)       Balance                                             Pertinent Vitals/Pain Pain Assessment: No/denies pain    Home Living Family/patient expects to be discharged to:: Private residence Living Arrangements: Spouse/significant other Available Help at Discharge: Family;Available 24 hours/day (Wife and son (son lives in same apartment complex) Type of Home: Apartment Home Access: Stairs to enter Entrance Stairs-Rails: Right Entrance Stairs-Number of Steps: 3 wide steps Home Layout: One level Home Equipment: Environmental consultant - 4 wheels;Cane - single point;Bedside commode;Shower seat;Grab bars - tub/shower;Transport chair      Prior Function Level of Independence: Independent with assistive device(s);Needs assistance   Gait / Transfers Assistance Needed: Uses rollator  ADL's / Homemaking Assistance Needed: Assist for meal prep and housekeeping        Hand Dominance   Dominant Hand: Right    Extremity/Trunk Assessment   Upper Extremity Assessment: Generalized weakness           Lower Extremity Assessment: Generalized weakness;LLE deficits/detail   LLE Deficits / Details: LLE is shorter than RLE.  Patient has his shoes built up.     Communication   Communication: HOH  Cognition Arousal/Alertness: Awake/alert Behavior During Therapy: WFL for tasks assessed/performed Overall Cognitive  Status: Within Functional Limits for tasks assessed       Memory: Decreased short-term memory              General Comments      Exercises        Assessment/Plan    PT Assessment Patient needs continued PT services  PT Diagnosis Difficulty walking;Abnormality of gait;Generalized weakness   PT Problem List Decreased strength;Decreased activity tolerance;Decreased balance;Decreased mobility;Decreased knowledge of use of DME;Decreased  safety awareness  PT Treatment Interventions DME instruction;Gait training;Functional mobility training;Therapeutic activities;Balance training;Therapeutic exercise;Patient/family education   PT Goals (Current goals can be found in the Care Plan section) Acute Rehab PT Goals Patient Stated Goal: To go home soon PT Goal Formulation: With patient/family Time For Goal Achievement: 05/02/15 Potential to Achieve Goals: Good    Frequency Min 3X/week   Barriers to discharge        Co-evaluation               End of Session Equipment Utilized During Treatment: Gait belt Activity Tolerance: Patient tolerated treatment well;Patient limited by fatigue Patient left: in bed;with call bell/phone within reach;with bed alarm set;with family/visitor present Nurse Communication: Mobility status         Time: 1536-1601 PT Time Calculation (min) (ACUTE ONLY): 25 min   Charges:   PT Evaluation $Initial PT Evaluation Tier I: 1 Procedure PT Treatments $Gait Training: 8-22 mins   PT G CodesDespina Pole 05-25-2015, 5:57 PM Carita Pian. Sanjuana Kava, [place] Pager [phone]

## 2015-04-25 NOTE — Progress Notes (Signed)
Pt arrived from HD alert and made comfortable.

## 2015-04-25 NOTE — Progress Notes (Signed)
Patient ID: [name] [name], male   DOB: 05/03/1941, 74 y.o.   MRN: [id number] [facility] PROGRESS NOTE  Hooper Petteway [id number] DOB: 11-30-1940 DOA: 2015/05/21 PCP: Leamon Arnt, MD  Brief narrative:    74 y.o. male with past medical history of ESRD on HD (TTS), history of heart transplant (takes cyclosporine and mycophenolate), hypertension, hypothyroidism, dyslipidemia who presented to [facility] with acute onset confusion as reported by his wife at the bedside.  On admission, pt was hemodynamically stable. He was found to have UTI based on urinalysis. He was started on empiric rocephin.   Barrier to discharge: Follow up urine culture results. Continue IV rocephin until culture results available. Needs HD today 6/4. Renal team consulted.   Assessment/Plan:    Principal Problem: UTI (lower urinary tract infection) / Acute metabolic encephalopathy / Leukocytosis  - UA on admission showed large leukocytes and many bacteria - Started on empiric rocephin and will continue same regimen day #3 - Follow up urine culture results - pt seems at baseline mental status this AM, A&O x 3, follows commands appropriately, HOH at baseline   Active Problems: ESRD on dialysis - pt on HD TTS, appreciate nephrology team   Anemia of chronic disease / Iron deficiency anemia  - Secondary to anemia of ERDS - Hemoglobin is 11.3 this AM - Stable. - Continue iron supplementation.  Hypertension, accelerated  - Continue metoprolol 75 mg PO BID, Hydralazine scheduled and as needed  Dyslipidemia - Continue Pravachol   History of heart transplant - Continue Cyclosporine 75 mg PO BID and mycophenolate 500 mg PO BID  Depression - Continue Prozac - Aricept was on hold since admission due to altered mental status - possible to resume in AM if mental status remains clear   Hypothyroidism - Continue synthroid  - TSH is WNL  Protein-calorie malnutrition, severe - In context of chronic illness,  ESRD - Nutrition consulted - Continue nutritional supplementation   DVT Prophylaxis  - Heparin subQ   Code Status: Full.  Family Communication:  plan of care discussed with the patient and his wife at the bedside  Disposition Plan: Home in next 2-3 days once UCx results ara available.   IV access:  Peripheral IV  Procedures and diagnostic studies:    Dg Chest Port 1 View 05-21-15  Minimal right basilar atelectasis noted.  Lungs otherwise clear.     Medical Consultants:  Nephrology  Other Consultants:  None   IAnti-Infectives:   Rocephin 05-21-2015 -->  Faye Ramsay, MD  [facility] Pager [phone]  Time spent in minutes: 25 minutes  If 7PM-7AM, please contact night-coverage [email or web address] Password [email or web address] 04/25/2015, 11:20 AM   LOS: 2 days    HPI/Subjective: No acute overnight events. Patient reports feeling better. A&O, HOH  Objective: Filed Vitals:   04/24/15 2330 04/25/15 0000 04/25/15 0624 04/25/15 0904  BP: 218/92 202/94 175/72 195/69  Pulse: 71 76 68 66  Temp:  97.7 F (36.5 C) 98.4 F (36.9 C) 98.5 F (36.9 C)  TempSrc:  Oral Oral Oral  Resp: 14 17 20 18   Height:      Weight:  64.2 kg (141 lb 8.6 oz)    SpO2:  100% 100% 100%    Intake/Output Summary (Last 24 hours) at 04/25/15 1120 Last data filed at 04/25/15 0900  Gross per 24 hour  Intake    725 ml  Output    850 ml  Net   -125 ml    Exam:   General:  Pt is  alert,  not in acute distress  Cardiovascular: Regular rate and rhythm, S1/S2 (+), SEM 2/6  Respiratory: Clear to auscultation bilaterally, no wheezing, diminished breath sounds at bases   Abdomen: Soft, non tender, non distended, bowel sounds present  Extremities: pulses DP and PT palpable bilaterally  Neuro: Grossly nonfocal  Data Reviewed: Basic Metabolic Panel:  Recent Labs Lab 04/22/15 2002 04/22/15 2017 04/23/15 0655 04/24/15 2040 04/25/15 0039 04/25/15 0634  NA 137 137 137 131* 134* 134*  K 3.5 3.5  3.8 4.5 3.3* 3.6  CL 93* 94* 94* 92* 92* 94*  CO2 32  --  29 24 31 29   GLUCOSE 138* 136* 92 138* 94 105*  BUN 14 16 19  52* 12 18  CREATININE 3.39* 3.30* 4.18* 6.19* 2.34* 3.30*  CALCIUM 8.8*  --  8.3* 7.8* 7.9* 8.1*  PHOS  --   --   --  4.9* 2.2*  --    Liver Function Tests:  Recent Labs Lab 04/22/15 2002 04/24/15 2040 04/25/15 0039  AST 20  --   --   ALT 21  --   --   ALKPHOS 98  --   --   BILITOT 0.7  --   --   PROT 6.8  --   --   ALBUMIN 3.7 3.0* 3.1*   CBC:  Recent Labs Lab 04/22/15 2002 04/22/15 2017 04/23/15 0655 04/24/15 2040 04/25/15 0634  WBC 11.9*  --  10.9* 7.1 5.8  HGB 12.7* 13.9 11.0* 10.6* 11.3*  HCT 40.1 41.0 34.8* 32.9* 35.0*  MCV 96.4  --  97.5 94.5 94.3  PLT 156  --  157 169 165   CBG:  Recent Labs Lab 04/22/15 2001  GLUCAP 130*    Recent Results (from the past 240 hour(s))  MRSA PCR Screening     Status: None   Collection Time: 04/23/15  7:37 AM  Result Value Ref Range Status   MRSA by PCR NEGATIVE NEGATIVE Final     Scheduled Meds: . cefTRIAXone   1 g Intravenous Q24H  . cholecalciferol  1,000 Units Oral Daily  . cycloSPORINE modified  75 mg Oral BID  . donepezil  10 mg Oral QHS  . feeding supplement  237 mL Oral BID BM  . ferrous sulfate  325 mg Oral TID WC  . FLUoxetine  20 mg Oral Q breakfast  . heparin  5,000 Units Subcutaneous 3 times per day  . levothyroxine  75 mcg Oral QAC breakfast  . metoprolol tartrate  75 mg Oral BID  . multivitamin  1 tablet Oral QHS  . mycophenolate  500 mg Oral BID  . pantoprazole  20 mg Oral Daily  . pravastatin  10 mg Oral Q breakfast  . vitamin B-12  1,000 mcg Oral Q breakfast

## 2015-04-25 NOTE — Progress Notes (Signed)
Post HD tx patient alert, no c/o. .7 L removed. Report called and patient returned safely to room. CCMD notified.

## 2015-04-25 NOTE — Progress Notes (Signed)
Subjective:  "feel better" HD yest. Tolerated/ Wife at bedside she states"  Better"  Objective Vital signs in last 24 hours: Filed Vitals:   04/24/15 2300 04/24/15 2330 04/25/15 0000 04/25/15 0624  BP: 213/94 218/92 202/94 175/72  Pulse: 69 71 76 68  Temp:   97.7 F (36.5 C) 98.4 F (36.9 C)  TempSrc:   Oral Oral  Resp: 15 14 17 20   Height:      Weight:   64.2 kg (141 lb 8.6 oz)   SpO2:   100% 100%   Weight change: 2.3 kg (5 lb 1.1 oz)  Physical Exam: General: alert WM , Oriented " To [facility], Sunday  16th June"/HARD OF HEARING but improved from yest. "has amplifier now" / NAD /cooperative with exam Heart: RRR, soft 1/6 sem lsb , No rub  Lungs: CTA bilat, non labored breathing Abdomen: BS pos, soft, NT. ND, No discomfort over bladder or flank tenderness Extremities: no pedal edema  Dialysis Access: Pos. Bruit RLU A AVF  Dialysis Orders: Center: [facility] TTS . EDW 63.5 HD Bath 2.0 k. 2.25ca Time 4hr Heparin 2000. Access LUA AVF 0 esa and 0 vitd/ Venofer 50mg  weekly hd  Other op labs hgb 11.7 Ca 8.9 phos 5.5 pth 292  Problem/Plan: 1. AMS with UTI= E. Coli on Rocephin  Mentation clearing/ HOH playing some role 2. ESRD - TTS HD ( [facility]) 3. Hypertension/volume -  vol Okay   1 kg >edw Per ?? wts / bp 175/72 on meto  And hydralazine 4. Anemia - hgb stable and no esa On weekly venofer hd/ can dc po fe 5. Metabolic bone disease - No vit d at HD, No Binders  Phos 2.2 6. Nutrition - alb 7. HO Card TX-=op fu at [facility] bap. Med center per wife/ on  meds  8. HO CVA /Dementia almost  baseline per wife   Ernest Haber, PA-C [facility] Beeper [phone] 04/25/2015,8:37 AM  LOS: 2 days   Pt seen, examined and agree w A/P as above. Back to baseline today per family, although was very confused during the night again. REc's as above.  [name] MD pager [phone]    cell [phone] 04/25/2015, 4:59 PM    Labs: Basic Metabolic  Panel:  Recent Labs Lab 04/24/15 2040 04/25/15 0039 04/25/15 0634  NA 131* 134* 134*  K 4.5 3.3* 3.6  CL 92* 92* 94*  CO2 24 31 29   GLUCOSE 138* 94 105*  BUN 52* 12 18  CREATININE 6.19* 2.34* 3.30*  CALCIUM 7.8* 7.9* 8.1*  PHOS 4.9* 2.2*  --    Liver Function Tests:  Recent Labs Lab 04/22/15 2002 04/24/15 2040 04/25/15 0039  AST 20  --   --   ALT 21  --   --   ALKPHOS 98  --   --   BILITOT 0.7  --   --   PROT 6.8  --   --   ALBUMIN 3.7 3.0* 3.1*   CBC:  Recent Labs Lab 04/22/15 2002  04/23/15 0655 04/24/15 2040 04/25/15 0634  WBC 11.9*  --  10.9* 7.1 5.8  HGB 12.7*  < > 11.0* 10.6* 11.3*  HCT 40.1  < > 34.8* 32.9* 35.0*  MCV 96.4  --  97.5 94.5 94.3  PLT 156  --  157 169 165  < > = values in this interval not displayed. Cardiac Enzymes: No results for input(s): CKTOTAL, CKMB, CKMBINDEX, TROPONINI in the last 168 hours. CBG:  Recent Labs Lab 04/22/15 2001  GLUCAP 130*    Studies/Results: No results found. Medications:   . cefTRIAXone (ROCEPHIN)  IV  1 g Intravenous Q24H  . cholecalciferol  1,000 Units Oral Daily  . cycloSPORINE modified  75 mg Oral BID  . feeding supplement (NEPRO CARB STEADY)  237 mL Oral BID BM  . ferrous sulfate  325 mg Oral TID WC  . FLUoxetine  20 mg Oral Q breakfast  . heparin  5,000 Units Subcutaneous 3 times per day  . hydrALAZINE  10 mg Oral 3 times per day  . levothyroxine  75 mcg Oral QAC breakfast  . metoprolol tartrate  75 mg Oral BID  . multivitamin  1 tablet Oral QHS  . mycophenolate  500 mg Oral BID  . pantoprazole  20 mg Oral Daily  . pravastatin  10 mg Oral Q breakfast  . vitamin B-12  1,000 mcg Oral Q breakfast

## 2015-04-26 LAB — CBC
HCT: 33.4 % — ABNORMAL LOW (ref 39.0–52.0)
Hemoglobin: 11 g/dL — ABNORMAL LOW (ref 13.0–17.0)
MCH: 31.1 pg (ref 26.0–34.0)
MCHC: 32.9 g/dL (ref 30.0–36.0)
MCV: 94.4 fL (ref 78.0–100.0)
PLATELETS: 195 10*3/uL (ref 150–400)
RBC: 3.54 MIL/uL — AB (ref 4.22–5.81)
RDW: 13.8 % (ref 11.5–15.5)
WBC: 6.7 10*3/uL (ref 4.0–10.5)

## 2015-04-26 LAB — BASIC METABOLIC PANEL
ANION GAP: 15 (ref 5–15)
BUN: 43 mg/dL — AB (ref 6–20)
CHLORIDE: 93 mmol/L — AB (ref 101–111)
CO2: 25 mmol/L (ref 22–32)
Calcium: 8.3 mg/dL — ABNORMAL LOW (ref 8.9–10.3)
Creatinine, Ser: 5.11 mg/dL — ABNORMAL HIGH (ref 0.61–1.24)
GFR calc Af Amer: 12 mL/min — ABNORMAL LOW (ref >60)
GFR calc non Af Amer: 10 mL/min — ABNORMAL LOW (ref >60)
Glucose, Bld: 122 mg/dL — ABNORMAL HIGH (ref 65–99)
POTASSIUM: 3.9 mmol/L (ref 3.5–5.1)
Sodium: 133 mmol/L — ABNORMAL LOW (ref 135–145)

## 2015-04-26 MED ORDER — CALCITRIOL 0.25 MCG PO CAPS
0.2500 ug | ORAL_CAPSULE | ORAL | Status: DC
Start: 2015-04-27 — End: 2015-04-27
  Administered 2015-04-27: 0.25 ug via ORAL
  Filled 2015-04-26: qty 1

## 2015-04-26 MED ORDER — CIPROFLOXACIN HCL 500 MG PO TABS
500.0000 mg | ORAL_TABLET | ORAL | Status: DC
  Administered 2015-04-26: 500 mg via ORAL
  Filled 2015-04-26 (×2): qty 1

## 2015-04-26 MED ORDER — ZOLPIDEM TARTRATE 5 MG PO TABS
5.0000 mg | ORAL_TABLET | Freq: Once | ORAL | Status: AC
  Administered 2015-04-26: 5 mg via ORAL
  Filled 2015-04-26: qty 1

## 2015-04-26 NOTE — Progress Notes (Signed)
[name] asked RN to check pt's BP standing before giving hydralazine due to pt's history of orthostatic hypotension. RN checked pt's BP standing 107/59. MD made aware and instructed that pt's BP can be checked standing before giving BP medications. Will continue to monitor pt.    Carole Civil, RN

## 2015-04-26 NOTE — Progress Notes (Signed)
[facility] Progress Note  Assessment/Plan: 1. E Coli UTI pansensitive- likely source of AMS on IV rocephin, WBC wnl, afebrile 2. ESRD -TTS AF K 3.9 - HD tomorrow if not d/c today 3. Anemia - Hgb 12.7 down to 11 - not on ESA prior to adm with recent outpt Hgb ok 11.7 - hold on ESA for now; continue weekly IV Fe 4. Secondary hyperparathyroidism - Ca 8.3 P ok, no calcitriol - last iPTH 292 - would use low dose 0.25 calcitriol and d/c cholecalciferol 5. HTN/volume - BP a little high - titrate volume on HD, looks like he needs a lower edw- only had 800 off on Sat with post wt of 64.2 6. Nutrition - alb 3.1  Myriam Jacobson, PA-C [facility] Beeper [phone] 04/26/2015,8:55 AM  LOS: 3 days   Subjective:   Slept better after he got a sleeping pill.  D/C plan to return home with wife and son.  Objective Filed Vitals:   04/26/15 0130 04/26/15 0628 04/26/15 0724 04/26/15 0834  BP: 176/64 183/69 160/70 160/72  Pulse:  70  74  Temp:  98.1 F (36.7 C)  97.9 F (36.6 C)  TempSrc:  Oral  Oral  Resp:  18  17  Height:      Weight:      SpO2:  99%  99%   Physical Exam General: HOH, alert and oriented, uremic odor Heart: RRR Lungs: no rales  Abdomen: soft NT Extremities: no LE edema Dialysis Access: left upper AVF + bruit  Dialysis Orders: Center: [facility] TTS . EDW 63.5 HD Bath 2.0 k. 2.25ca Time 4hr Heparin 2000. Access LUA AVF 0 esa and 0 vitd/ Venofer 50mg  weekly hd Other op labs hgb 11.7 Ca 8.9 phos 5.5 pth 292  Additional Objective Labs: Basic Metabolic Panel:  Recent Labs Lab 04/24/15 2040 04/25/15 0039 04/25/15 0634 04/26/15 0500  NA 131* 134* 134* 133*  K 4.5 3.3* 3.6 3.9  CL 92* 92* 94* 93*  CO2 24 31 29 25   GLUCOSE 138* 94 105* 122*  BUN 52* 12 18 43*  CREATININE 6.19* 2.34* 3.30* 5.11*  CALCIUM 7.8* 7.9* 8.1* 8.3*  PHOS 4.9* 2.2*  --   --    Liver Function Tests:  Recent Labs Lab 04/22/15 2002 04/24/15 2040  04/25/15 0039  AST 20  --   --   ALT 21  --   --   ALKPHOS 98  --   --   BILITOT 0.7  --   --   PROT 6.8  --   --   ALBUMIN 3.7 3.0* 3.1*   CBC:  Recent Labs Lab 04/22/15 2002  04/23/15 0655 04/24/15 2040 04/25/15 0634 04/26/15 0500  WBC 11.9*  --  10.9* 7.1 5.8 6.7  HGB 12.7*  < > 11.0* 10.6* 11.3* 11.0*  HCT 40.1  < > 34.8* 32.9* 35.0* 33.4*  MCV 96.4  --  97.5 94.5 94.3 94.4  PLT 156  --  157 169 165 195  < > = values in this interval not displayed. Blood Culture    Component Value Date/Time   SDES URINE, CLEAN CATCH 04/22/2015 1950   SPECREQUEST NONE 04/22/2015 1950   CULT  04/22/2015 1950    ESCHERICHIA COLI Performed at [place] 04/25/2015 FINAL 04/22/2015 1950    CBG:  Recent Labs Lab 04/22/15 2001  GLUCAP 130*  Medications:   . cefTRIAXone (ROCEPHIN)  IV  1 g Intravenous Q24H  .  cholecalciferol  1,000 Units Oral Daily  . cycloSPORINE modified  75 mg Oral BID  . feeding supplement (NEPRO CARB STEADY)  237 mL Oral BID BM  . FLUoxetine  20 mg Oral Q breakfast  . heparin  5,000 Units Subcutaneous 3 times per day  . hydrALAZINE  25 mg Oral 3 times per day  . levothyroxine  75 mcg Oral QAC breakfast  . metoprolol tartrate  75 mg Oral BID  . multivitamin  1 tablet Oral QHS  . mycophenolate  500 mg Oral BID  . pantoprazole  20 mg Oral Daily  . pravastatin  10 mg Oral Q breakfast  . vitamin B-12  1,000 mcg Oral Q breakfast

## 2015-04-26 NOTE — Progress Notes (Signed)
Patient ID: [name], male   DOB: 1941-03-19, 74 y.o.   MRN: [id number] [facility] PROGRESS NOTE  Amori Colomb KXF:[id number] DOB: 1941-07-04 DOA: 2015-04-27 PCP: Leamon Arnt, MD  Brief narrative:    73 y.o. male with past medical history of ESRD on HD (TTS), history of heart transplant (takes cyclosporine and mycophenolate), hypertension, hypothyroidism, dyslipidemia who presented to [facility] with acute onset confusion as reported by his wife at the bedside.  On admission, pt was hemodynamically stable. He was found to have UTI based on urinalysis. He was started on empiric rocephin.   Barrier to discharge: Follow up urine culture results. Continue IV rocephin until culture results available. Needs HD today 6/4. Renal team consulted.   Assessment/Plan:    Principal Problem: UTI (lower urinary tract infection) / Acute metabolic encephalopathy / Leukocytosis  - UA on admission showed large leukocytes and many bacteria - Started on empiric rocephin , today's day 4, transition to oral ciprofloxacin - Urine culture positive for Escherichia coli pansensitive - If patient tolerating Cipro well, DC in the morning  Active Problems: ESRD on dialysis - pt on HD TTS, appreciate nephrology team  - Plan DC after hemodialysis tomorrow  Anemia of chronic disease / Iron deficiency anemia  - Secondary to anemia of ERDS - Hemoglobin stable - Continue iron supplementation.  Hypertension, accelerated  - Continue metoprolol 75 mg PO BID, Hydralazine as needed only  Dyslipidemia - Continue Pravachol   History of heart transplant - Continue Cyclosporine 75 mg PO BID and mycophenolate 500 mg PO BID  Depression - Continue Prozac - Aricept was on hold since admission due to altered mental status - possible to resume in AM if mental status remains clear   Hypothyroidism - Continue synthroid  - TSH is WNL  Protein-calorie malnutrition, severe - In context of chronic illness,  ESRD - Nutrition consulted - Continue nutritional supplementation   DVT Prophylaxis  - Heparin subQ   Code Status: Full.  Family Communication:  plan of care discussed with the patient and his wife at the bedside  Disposition Plan: Home in a.m. after hemodialysis   IV access:  Peripheral IV  Procedures and diagnostic studies:    Dg Chest Port 1 View 27-Apr-2015  Minimal right basilar atelectasis noted.  Lungs otherwise clear.     Medical Consultants:  Nephrology  Other Consultants:  None   IAnti-Infectives:   Rocephin 2015-04-27 --> 6/6 Cipro 6/6 -->  MAGICK-Brave Dack, MD  [facility] Pager [phone]  Time spent in minutes: 25 minutes  If 7PM-7AM, please contact night-coverage [email or web address] Password [email or web address] 04/26/2015, 6:45 PM   LOS: 3 days    HPI/Subjective: No acute overnight events. Patient reports feeling better. A&O, HOH  Objective: Filed Vitals:   04/26/15 1544 04/26/15 1730 04/26/15 1831 04/26/15 1832  BP: 107/59 172/65 163/62 98/57  Pulse: 69 65    Temp:  97.9 F (36.6 C)    TempSrc:  Oral    Resp:  17    Height:      Weight:      SpO2:  100%      Intake/Output Summary (Last 24 hours) at 04/26/15 1845 Last data filed at 04/26/15 1828  Gross per 24 hour  Intake    510 ml  Output      0 ml  Net    510 ml    Exam:   General:  Pt is alert,  not in acute distress  Cardiovascular: Regular rate and rhythm, S1/S2 (+), SEM  2/6  Respiratory: Clear to auscultation bilaterally, no wheezing, diminished breath sounds at bases   Abdomen: Soft, non tender, non distended, bowel sounds present  Extremities: pulses DP and PT palpable bilaterally  Neuro: Grossly nonfocal  Data Reviewed: Basic Metabolic Panel:  Recent Labs Lab 04/23/15 0655 04/24/15 2040 04/25/15 0039 04/25/15 0634 04/26/15 0500  NA 137 131* 134* 134* 133*  K 3.8 4.5 3.3* 3.6 3.9  CL 94* 92* 92* 94* 93*  CO2 29 24 31 29 25   GLUCOSE 92 138* 94 105* 122*  BUN 19 52* 12  18 43*  CREATININE 4.18* 6.19* 2.34* 3.30* 5.11*  CALCIUM 8.3* 7.8* 7.9* 8.1* 8.3*  PHOS  --  4.9* 2.2*  --   --    Liver Function Tests:  Recent Labs Lab 04/22/15 2002 04/24/15 2040 04/25/15 0039  AST 20  --   --   ALT 21  --   --   ALKPHOS 98  --   --   BILITOT 0.7  --   --   PROT 6.8  --   --   ALBUMIN 3.7 3.0* 3.1*   CBC:  Recent Labs Lab 04/22/15 2002 04/22/15 2017 04/23/15 0655 04/24/15 2040 04/25/15 0634 04/26/15 0500  WBC 11.9*  --  10.9* 7.1 5.8 6.7  HGB 12.7* 13.9 11.0* 10.6* 11.3* 11.0*  HCT 40.1 41.0 34.8* 32.9* 35.0* 33.4*  MCV 96.4  --  97.5 94.5 94.3 94.4  PLT 156  --  157 169 165 195   CBG:  Recent Labs Lab 04/22/15 2001  GLUCAP 130*    Recent Results (from the past 240 hour(s))  MRSA PCR Screening     Status: None   Collection Time: 04/23/15  7:37 AM  Result Value Ref Range Status   MRSA by PCR NEGATIVE NEGATIVE Final     Scheduled Meds: . cefTRIAXone   1 g Intravenous Q24H  . cholecalciferol  1,000 Units Oral Daily  . cycloSPORINE modified  75 mg Oral BID  . donepezil  10 mg Oral QHS  . feeding supplement  237 mL Oral BID BM  . ferrous sulfate  325 mg Oral TID WC  . FLUoxetine  20 mg Oral Q breakfast  . heparin  5,000 Units Subcutaneous 3 times per day  . levothyroxine  75 mcg Oral QAC breakfast  . metoprolol tartrate  75 mg Oral BID  . multivitamin  1 tablet Oral QHS  . mycophenolate  500 mg Oral BID  . pantoprazole  20 mg Oral Daily  . pravastatin  10 mg Oral Q breakfast  . vitamin B-12  1,000 mcg Oral Q breakfast

## 2015-04-26 NOTE — Progress Notes (Addendum)
04/26/2015 6:36 AM  Patient BP 183/69. MD notified. MD advised to give IV hydralazine 5mg . Orders implemented. Recheck of BP: 160/70. Report given to oncoming nurse.    Ermalinda Memos, RN  [facility]

## 2015-04-26 NOTE — Progress Notes (Signed)
Seen, examined, eval and patient  Counseled.  Need to lower vol to control bp and can lower meds.  No indic for cholecalciferol. Needs low dose calcitriol.  Ok to D/C home from our standpoint.

## 2015-04-26 NOTE — Progress Notes (Signed)
04/26/2015 1:30 AM  Patient BP 166/68. Manually taken 176/64. MD paged. MD states RN to page if it goes up in the 180's. RN will continue to monitor.   Ermalinda Memos, RN  [facility]

## 2015-04-26 NOTE — Progress Notes (Signed)
MD made aware of Pt's elevated systolic BP of 072 via text paged. Will continue to monitor pt.   Carole Civil, RN

## 2015-04-27 LAB — CBC
HCT: 32 % — ABNORMAL LOW (ref 39.0–52.0)
HEMOGLOBIN: 10.6 g/dL — AB (ref 13.0–17.0)
MCH: 30.7 pg (ref 26.0–34.0)
MCHC: 33.1 g/dL (ref 30.0–36.0)
MCV: 92.8 fL (ref 78.0–100.0)
Platelets: 212 10*3/uL (ref 150–400)
RBC: 3.45 MIL/uL — ABNORMAL LOW (ref 4.22–5.81)
RDW: 13.9 % (ref 11.5–15.5)
WBC: 5.6 10*3/uL (ref 4.0–10.5)

## 2015-04-27 LAB — RENAL FUNCTION PANEL
ALBUMIN: 2.9 g/dL — AB (ref 3.5–5.0)
Anion gap: 15 (ref 5–15)
BUN: 61 mg/dL — AB (ref 6–20)
CALCIUM: 8.2 mg/dL — AB (ref 8.9–10.3)
CO2: 24 mmol/L (ref 22–32)
CREATININE: 6.5 mg/dL — AB (ref 0.61–1.24)
Chloride: 91 mmol/L — ABNORMAL LOW (ref 101–111)
GFR calc Af Amer: 9 mL/min — ABNORMAL LOW (ref >60)
GFR calc non Af Amer: 7 mL/min — ABNORMAL LOW (ref >60)
Glucose, Bld: 93 mg/dL (ref 65–99)
Phosphorus: 5.4 mg/dL — ABNORMAL HIGH (ref 2.5–4.6)
Potassium: 3.7 mmol/L (ref 3.5–5.1)
Sodium: 130 mmol/L — ABNORMAL LOW (ref 135–145)

## 2015-04-27 MED ORDER — DARBEPOETIN ALFA 60 MCG/0.3ML IJ SOSY
60.0000 ug | PREFILLED_SYRINGE | INTRAMUSCULAR | Status: DC
  Administered 2015-04-27: 60 ug via INTRAVENOUS

## 2015-04-27 MED ORDER — NEPRO/CARBSTEADY PO LIQD
237.0000 mL | ORAL | Status: DC | PRN
  Filled 2015-04-27: qty 237

## 2015-04-27 MED ORDER — HEPARIN SODIUM (PORCINE) 1000 UNIT/ML DIALYSIS: 1000.0000 [IU] | INTRAMUSCULAR | Status: DC | PRN

## 2015-04-27 MED ORDER — SODIUM CHLORIDE 0.9 % IV SOLN: 100.0000 mL | INTRAVENOUS | Status: DC | PRN

## 2015-04-27 MED ORDER — CALCITRIOL 0.25 MCG PO CAPS
ORAL_CAPSULE | ORAL | Status: AC
  Filled 2015-04-27: qty 1

## 2015-04-27 MED ORDER — AMOXICILLIN 500 MG PO TABS: 500.0000 mg | ORAL_TABLET | Freq: Two times a day (BID) | ORAL | Status: DC

## 2015-04-27 MED ORDER — LIDOCAINE HCL (PF) 1 % IJ SOLN: 5.0000 mL | INTRAMUSCULAR | Status: DC | PRN

## 2015-04-27 MED ORDER — CIPROFLOXACIN HCL 500 MG PO TABS: 500.0000 mg | ORAL_TABLET | ORAL | Status: DC

## 2015-04-27 MED ORDER — AMOXICILLIN 500 MG PO TABS: 500.0000 mg | ORAL_TABLET | Freq: Every day | ORAL | Status: DC

## 2015-04-27 MED ORDER — HEPARIN SODIUM (PORCINE) 1000 UNIT/ML DIALYSIS: 20.0000 [IU]/kg | INTRAMUSCULAR | Status: DC | PRN

## 2015-04-27 MED ORDER — PENTAFLUOROPROP-TETRAFLUOROETH EX AERO: 1.0000 "application " | INHALATION_SPRAY | CUTANEOUS | Status: DC | PRN

## 2015-04-27 MED ORDER — ALTEPLASE 2 MG IJ SOLR
2.0000 mg | Freq: Once | INTRAMUSCULAR | Status: DC | PRN
  Filled 2015-04-27: qty 2

## 2015-04-27 MED ORDER — LIDOCAINE-PRILOCAINE 2.5-2.5 % EX CREA
1.0000 "application " | TOPICAL_CREAM | CUTANEOUS | Status: DC | PRN
  Filled 2015-04-27: qty 5

## 2015-04-27 MED ORDER — DARBEPOETIN ALFA 60 MCG/0.3ML IJ SOSY
PREFILLED_SYRINGE | INTRAMUSCULAR | Status: AC
  Filled 2015-04-27: qty 0.3

## 2015-04-27 NOTE — Progress Notes (Signed)
[name] Progress Note  Assessment/Plan: . E Coli UTI pansensitive- likely source of AMS on IV rocephin - changed to Cipro, WBC wnl, afebrile Adjusted for ESRD 2. ESRD -TTS AF K 3.9 -6/6 -starting on 4 K bath - labs pending BP ^, lower vol 3. Anemia - Hgb 12.7 down to 11 6/6- not on ESA prior to adm with recent outpt Hgb ok 11.7 - hold on ESA for now; continue weekly IV Fe 4. Secondary hyperparathyroidism - Ca 8.3 P ok, no calcitriol - last iPTH 292 - have started  0.25 calcitriol with TTS HD and stopped cholecalciferol - on 2.25 Ca bath as outpt 5. HTN/volume - BP high - titrate volume on HD, looks like he needs a lower edw- only had 800 off on Sat with post wt of 64.2; goal today - goal 4 L - pre HD weight is 2 kg above EDW - needs standing weights and orthostatics post HD-d/w with HD RN; hydralazine stopped yesterday; He did not stand pre HD because RN was told he has orthostasis which reinforces the NEED to have orthostatics pre and post HD! He ambulates with a walker at home. 6. Nutrition - alb 3.1 7. Disp - d/c home today if no new problems  [name] Jacobson, PA-C [place] [phone] 04/27/2015,8:17 AM  LOS: 4 days  I have seen and examined this patient and agree with the plan of care seen,eval,examined,meds reviewed. .  [name] [name] L 04/27/2015, 9:14 AM   Subjective:     Objective Filed Vitals:   04/26/15 1832 04/26/15 2018 04/27/15 0437 04/27/15 0802  BP: 98/57 158/60 185/80 221/90  Pulse:  69 70 63  Temp:  98.2 F (36.8 C) 97.2 F (36.2 C) 97.6 F (36.4 C)  TempSrc:  Oral Oral Oral  Resp:  18 18 12   Height:      Weight:  64.003 kg (141 lb 1.6 oz)  64.9 kg (143 lb 1.3 oz)  SpO2:  100% 100% 100%   Physical Exam General: NAD HOH Heart: RRR bruit radiates to precordium Lungs: no rales Abdomen: soft NT Extremities: no sig edema Dialysis Access:  Left upper AVF Qb 400  Dialysis Orders: Center: [facility] TTS . EDW 63.5 HD  Bath 2.0 k. 2.25ca Time 4hr Heparin 2000. Access LUA AVF 0 esa and 0 vitd/ Venofer 50mg  weekly hd Other op labs hgb 11.7 Ca 8.9 phos 5.5 pth 292   Additional Objective Labs: Basic Metabolic Panel:  Recent Labs Lab 04/24/15 2040 04/25/15 0039 04/25/15 0634 04/26/15 0500  NA 131* 134* 134* 133*  K 4.5 3.3* 3.6 3.9  CL 92* 92* 94* 93*  CO2 24 31 29 25   GLUCOSE 138* 94 105* 122*  BUN 52* 12 18 43*  CREATININE 6.19* 2.34* 3.30* 5.11*  CALCIUM 7.8* 7.9* 8.1* 8.3*  PHOS 4.9* 2.2*  --   --    Liver Function Tests:  Recent Labs Lab 04/22/15 2002 04/24/15 2040 04/25/15 0039  AST 20  --   --   ALT 21  --   --   ALKPHOS 98  --   --   BILITOT 0.7  --   --   PROT 6.8  --   --   ALBUMIN 3.7 3.0* 3.1*   CBC:  Recent Labs Lab 04/22/15 2002  04/23/15 0655 04/24/15 2040 04/25/15 0634 04/26/15 0500  WBC 11.9*  --  10.9* 7.1 5.8 6.7  HGB 12.7*  < > 11.0* 10.6* 11.3* 11.0*  HCT  40.1  < > 34.8* 32.9* 35.0* 33.4*  MCV 96.4  --  97.5 94.5 94.3 94.4  PLT 156  --  157 169 165 195  < > = values in this interval not displayed. Blood Culture    Component Value Date/Time   SDES URINE, CLEAN CATCH 04/22/2015 1950   SPECREQUEST NONE 04/22/2015 1950   CULT  04/22/2015 1950    ESCHERICHIA COLI Performed at [place] 04/25/2015 FINAL 04/22/2015 1950   CBG:  Recent Labs Lab 04/22/15 2001  GLUCAP 130*  Medications:   . calcitRIOL  0.25 mcg Oral Q T,Th,Sa-HD  . ciprofloxacin  500 mg Oral Q24H  . cycloSPORINE modified  75 mg Oral BID  . feeding supplement (NEPRO CARB STEADY)  237 mL Oral BID BM  . FLUoxetine  20 mg Oral Q breakfast  . heparin  5,000 Units Subcutaneous 3 times per day  . levothyroxine  75 mcg Oral QAC breakfast  . metoprolol tartrate  75 mg Oral BID  . multivitamin  1 tablet Oral QHS  . mycophenolate  500 mg Oral BID  . pantoprazole  20 mg Oral Daily  . pravastatin  10 mg Oral Q breakfast  . vitamin B-12  1,000 mcg Oral Q  breakfast

## 2015-04-27 NOTE — Progress Notes (Signed)
PT Cancellation Note  Patient Details Name: [name] MRN: [id number] DOB: 1941-09-23   Cancelled Treatment:    Reason Eval/Treat Not Completed: Patient at procedure or test/unavailable Currently in HD Will follow up later today as time allows;  Otherwise, will follow up for PT tomorrow;   Elvera Bicker, SPT Acute Rehab Services Office [phone]    Mima Cranmore 04/27/2015, 8:43 AM

## 2015-04-27 NOTE — Progress Notes (Signed)
Pt completed HD with no complications. 3.5 L removed. Alert, vss, no complaints. Report called to RN and CCMD notified. Pt returned safely to room.

## 2015-04-27 NOTE — Discharge Summary (Signed)
Physician Discharge Summary  [name] [name] QJJ:[id number] DOB: 15-Oct-1941 DOA: 04/22/2015  PCP: Leamon Arnt, MD  Admit date: 04/22/2015 Discharge date: 04/27/2015  Recommendations for Outpatient Follow-up:  1. Pt will need to follow up with PCP in 2-3 weeks post discharge 2. Pt discharged on Amoxicillin to complete therapy for UTI, based on the urine culture sensitivity report   Discharge Diagnoses:  Principal Problem:   Acute encephalopathy Active Problems:   Hypercholesterolemia   Heart transplanted   Hypertension   Hypothyroidism   ESRD on dialysis   Barrett's esophagus   UTI (lower urinary tract infection)   Protein-calorie malnutrition, severe   Leukocytosis   Anemia of chronic disease   Depression  Discharge Condition: Stable  Diet recommendation:Renal diet  Brief narrative:    74 y.o. male with past medical history of ESRD on HD (TTS), history of heart transplant (takes cyclosporine and mycophenolate), hypertension, hypothyroidism, dyslipidemia who presented to [facility] with acute onset confusion as reported by his wife at the bedside.  On admission, pt was hemodynamically stable. He was found to have UTI based on urinalysis. He was started on empiric rocephin.   Assessment/Plan:    Principal Problem: UTI (lower urinary tract infection) / Acute metabolic encephalopathy / Leukocytosis  - UA on admission showed large leukocytes and many bacteria - Started on empiric rocephin - Urine culture positive for Escherichia coli pansensitive - pt transitioned to oral Amoxicillin upon discharge based on the sensitivity report   Active Problems: ESRD on dialysis - pt on HD TTS, appreciate nephrology team  - tolerated HD well piror to d/c  Anemia of chronic disease / Iron deficiency anemia  - Secondary to anemia of ERDS - Hemoglobin stable - Continue iron supplementation.  Hypertension, accelerated  - Continue metoprolol 75 mg PO BID, Hydralazine as needed  only  Dyslipidemia - Continue Pravachol   History of heart transplant - Continue Cyclosporine 75 mg PO BID and mycophenolate 500 mg PO BID  Depression - Continue Prozac - Aricept was on hold since admission due to altered mental status - possible to resume in AM if mental status remains clear   Hypothyroidism - Continue synthroid  - TSH is WNL  Protein-calorie malnutrition, severe - In context of chronic illness, ESRD - Nutrition consulted - Continue nutritional supplementation   Code Status: Full.  Family Communication: plan of care discussed with the patient and his wife at the bedside  Disposition Plan: Home   IV access:  Peripheral IV  Procedures and diagnostic studies:   Dg Chest Port 1 View 04/22/2015 Minimal right basilar atelectasis noted. Lungs otherwise clear.   Medical Consultants:  Nephrology  Other Consultants:  None   IAnti-Infectives:   Rocephin 04/22/2015 --> 6/6 Amoxicillin 6/6 -->      Discharge Exam: Filed Vitals:   04/27/15 0900  BP: 177/82  Pulse: 64  Temp:   Resp: 17   Filed Vitals:   04/27/15 0802 04/27/15 0807 04/27/15 0830 04/27/15 0900  BP: 221/90 206/88 195/86 177/82  Pulse: 63 62 62 64  Temp: 97.6 F (36.4 C)     TempSrc: Oral     Resp: 12 17 15 17   Height:      Weight: 64.9 kg (143 lb 1.3 oz)     SpO2: 100% 100% 99% 100%    General: Pt is alert, follows commands appropriately, not in acute distress, HOH Cardiovascular: Regular rate and rhythm, no rubs, no gallops Respiratory: Clear to auscultation bilaterally, no wheezing, no crackles, no rhonchi Abdominal:  Soft, non tender, non distended, bowel sounds +, no guarding Extremities:, no cyanosis, pulses palpable bilaterally DP and PT Neuro: Grossly nonfocal  Discharge Instructions  Discharge Instructions    Diet - low sodium heart healthy    Complete by:  As directed      Increase activity slowly    Complete by:  As directed              Medication List    TAKE these medications        acetaminophen 325 MG tablet  Commonly known as:  TYLENOL  Take 2 tablets (650 mg total) by mouth every 6 (six) hours as needed for mild pain (or Fever >/= 101).     amoxicillin 500 MG tablet  Commonly known as:  AMOXIL  Take 1 tablet (500 mg total) by mouth 2 (two) times daily.     cholecalciferol 1000 UNITS tablet  Commonly known as:  VITAMIN D  Take 1,000 Units by mouth daily.     cycloSPORINE modified 25 MG capsule  Commonly known as:  NEORAL  Take 75 mg by mouth 2 (two) times daily.     diphenoxylate-atropine 2.5-0.025 MG per tablet  Commonly known as:  LOMOTIL  Take 1 tablet by mouth 2 (two) times daily as needed.     donepezil 10 MG tablet  Commonly known as:  ARICEPT  Take 10 mg by mouth.     ferrous sulfate 325 (65 FE) MG tablet  Take 325 mg by mouth 3 (three) times daily with meals.     FLUoxetine 20 MG capsule  Commonly known as:  PROZAC  Take 20 mg by mouth daily with breakfast.     lansoprazole 15 MG capsule  Commonly known as:  PREVACID  Take 15 mg by mouth daily.     levothyroxine 75 MCG tablet  Commonly known as:  SYNTHROID, LEVOTHROID  Take 75 mcg by mouth daily before breakfast.     metoprolol tartrate 25 MG tablet  Commonly known as:  LOPRESSOR  Take 2 tablets (50 mg total) by mouth 2 (two) times daily.     multivitamin Tabs tablet     mycophenolate 250 MG capsule  Commonly known as:  CELLCEPT  Take 500 mg by mouth 2 (two) times daily.     pravastatin 10 MG tablet  Commonly known as:  PRAVACHOL  Take 10 mg by mouth daily with breakfast.     vitamin B-12 1000 MCG tablet  Commonly known as:  CYANOCOBALAMIN  Take 1,000 mcg by mouth daily with breakfast.             Follow-up Information    Follow up with ANDY,CAMILLE L, MD.   Specialty:  Family Medicine   Contact information:   [address] [place] [address] [phone]       Call Faye Ramsay, MD.    Specialty:  Internal Medicine   Why:  As needed   Contact information:   [address] [place] [address] [phone]        The results of significant diagnostics from this hospitalization (including imaging, microbiology, ancillary and laboratory) are listed below for reference.     Microbiology: Recent Results (from the past 240 hour(s))  Urine culture     Status: None   Collection Time: 04/22/15  7:50 PM  Result Value Ref Range Status   Specimen Description URINE, CLEAN CATCH  Final   Special Requests NONE  Final  Colony Count   Final    >=100,000 COLONIES/ML Performed at [facility]    Culture   Final    ESCHERICHIA COLI Performed at [facility]    Report Status 04/25/2015 FINAL  Final   Organism ID, Bacteria ESCHERICHIA COLI  Final      Susceptibility   Escherichia coli - MIC*    AMPICILLIN <=2 SENSITIVE Sensitive     CEFAZOLIN <=4 SENSITIVE Sensitive     CEFTRIAXONE <=1 SENSITIVE Sensitive     CIPROFLOXACIN <=0.25 SENSITIVE Sensitive     GENTAMICIN <=1 SENSITIVE Sensitive     LEVOFLOXACIN <=0.12 SENSITIVE Sensitive     NITROFURANTOIN <=16 SENSITIVE Sensitive     TOBRAMYCIN <=1 SENSITIVE Sensitive     TRIMETH/SULFA <=20 SENSITIVE Sensitive     PIP/TAZO <=4 SENSITIVE Sensitive     * ESCHERICHIA COLI  MRSA PCR Screening     Status: None   Collection Time: 04/23/15  7:37 AM  Result Value Ref Range Status   MRSA by PCR NEGATIVE NEGATIVE Final    Comment:        The GeneXpert MRSA Assay (FDA approved for NASAL specimens only), is one component of a comprehensive MRSA colonization surveillance program. It is not intended to diagnose MRSA infection nor to guide or monitor treatment for MRSA infections.      Labs: Basic Metabolic Panel:  Recent Labs Lab 04/24/15 2040 04/25/15 0039 04/25/15 0634 04/26/15 0500 04/27/15 0500  NA 131* 134* 134* 133* 130*  K 4.5 3.3* 3.6 3.9 3.7  CL 92* 92* 94* 93* 91*  CO2  24 31 29 25 24   GLUCOSE 138* 94 105* 122* 93  BUN 52* 12 18 43* 61*  CREATININE 6.19* 2.34* 3.30* 5.11* 6.50*  CALCIUM 7.8* 7.9* 8.1* 8.3* 8.2*  PHOS 4.9* 2.2*  --   --  5.4*   Liver Function Tests:  Recent Labs Lab 04/22/15 2002 04/24/15 2040 04/25/15 0039 04/27/15 0500  AST 20  --   --   --   ALT 21  --   --   --   ALKPHOS 98  --   --   --   BILITOT 0.7  --   --   --   PROT 6.8  --   --   --   ALBUMIN 3.7 3.0* 3.1* 2.9*   No results for input(s): LIPASE, AMYLASE in the last 168 hours. No results for input(s): AMMONIA in the last 168 hours. CBC:  Recent Labs Lab 04/23/15 0655 04/24/15 2040 04/25/15 0634 04/26/15 0500 04/27/15 0830  WBC 10.9* 7.1 5.8 6.7 5.6  HGB 11.0* 10.6* 11.3* 11.0* 10.6*  HCT 34.8* 32.9* 35.0* 33.4* 32.0*  MCV 97.5 94.5 94.3 94.4 92.8  PLT 157 169 165 195 212    ProBNP (last 3 results)  Recent Labs  10/05/14 1420  PROBNP >70000.0*    CBG:  Recent Labs Lab 04/22/15 2001  GLUCAP 130*   SIGNED: Time coordinating discharge: 30 minutes  Faye Ramsay, MD  [facility] 04/27/2015, 9:53 AM Pager [phone]  If 7PM-7AM, please contact night-coverage [email or web address] Password [email or web address]

## 2015-04-27 NOTE — Discharge Instructions (Signed)

## 2015-04-27 NOTE — Procedures (Signed)
I was present at this session.  I have reviewed the session itself and made appropriate changes.  HD via LUA avf , access press ok ,blood pressure ^, lower vol  Signa Cheek L 6/7/20169:15 AM

## 2015-05-04 ENCOUNTER — Other Ambulatory Visit: Payer: Self-pay

## 2015-05-04 ENCOUNTER — Emergency Department (HOSPITAL_COMMUNITY)
Admission: EM | Admit: 2015-05-04 | Discharge: 2015-05-04 | Disposition: A | Discharge status: Home / Self Care | Payer: Medicare HMO | Attending: Emergency Medicine | Admitting: Emergency Medicine

## 2015-05-04 ENCOUNTER — Emergency Department (HOSPITAL_COMMUNITY): Payer: Medicare HMO

## 2015-05-04 DIAGNOSIS — N184 Chronic kidney disease, stage 4 (severe): Secondary | ICD-10-CM | POA: Diagnosis not present

## 2015-05-04 DIAGNOSIS — Z79899 Other long term (current) drug therapy: Secondary | ICD-10-CM | POA: Diagnosis not present

## 2015-05-04 DIAGNOSIS — Z8719 Personal history of other diseases of the digestive system: Secondary | ICD-10-CM | POA: Diagnosis not present

## 2015-05-04 DIAGNOSIS — F0390 Unspecified dementia without behavioral disturbance: Secondary | ICD-10-CM | POA: Insufficient documentation

## 2015-05-04 DIAGNOSIS — E119 Type 2 diabetes mellitus without complications: Secondary | ICD-10-CM | POA: Diagnosis not present

## 2015-05-04 DIAGNOSIS — I129 Hypertensive chronic kidney disease with stage 1 through stage 4 chronic kidney disease, or unspecified chronic kidney disease: Secondary | ICD-10-CM | POA: Insufficient documentation

## 2015-05-04 DIAGNOSIS — Z8673 Personal history of transient ischemic attack (TIA), and cerebral infarction without residual deficits: Secondary | ICD-10-CM | POA: Diagnosis not present

## 2015-05-04 DIAGNOSIS — Z85828 Personal history of other malignant neoplasm of skin: Secondary | ICD-10-CM | POA: Insufficient documentation

## 2015-05-04 DIAGNOSIS — Z862 Personal history of diseases of the blood and blood-forming organs and certain disorders involving the immune mechanism: Secondary | ICD-10-CM | POA: Diagnosis not present

## 2015-05-04 DIAGNOSIS — Z951 Presence of aortocoronary bypass graft: Secondary | ICD-10-CM | POA: Diagnosis not present

## 2015-05-04 DIAGNOSIS — I252 Old myocardial infarction: Secondary | ICD-10-CM | POA: Diagnosis not present

## 2015-05-04 DIAGNOSIS — E785 Hyperlipidemia, unspecified: Secondary | ICD-10-CM | POA: Diagnosis not present

## 2015-05-04 DIAGNOSIS — I2510 Atherosclerotic heart disease of native coronary artery without angina pectoris: Secondary | ICD-10-CM | POA: Insufficient documentation

## 2015-05-04 DIAGNOSIS — R569 Unspecified convulsions: Secondary | ICD-10-CM | POA: Insufficient documentation

## 2015-05-04 DIAGNOSIS — Z9889 Other specified postprocedural states: Secondary | ICD-10-CM | POA: Diagnosis not present

## 2015-05-04 LAB — COMPREHENSIVE METABOLIC PANEL
ALT: 38 U/L (ref 17–63)
ANION GAP: 13 (ref 5–15)
AST: 45 U/L — ABNORMAL HIGH (ref 15–41)
Albumin: 3.6 g/dL (ref 3.5–5.0)
Alkaline Phosphatase: 112 U/L (ref 38–126)
BUN: 33 mg/dL — AB (ref 6–20)
CALCIUM: 8.8 mg/dL — AB (ref 8.9–10.3)
CO2: 30 mmol/L (ref 22–32)
Chloride: 93 mmol/L — ABNORMAL LOW (ref 101–111)
Creatinine, Ser: 4.62 mg/dL — ABNORMAL HIGH (ref 0.61–1.24)
GFR, EST AFRICAN AMERICAN: 13 mL/min — AB (ref >60)
GFR, EST NON AFRICAN AMERICAN: 11 mL/min — AB (ref >60)
GLUCOSE: 150 mg/dL — AB (ref 65–99)
Potassium: 4.5 mmol/L (ref 3.5–5.1)
SODIUM: 136 mmol/L (ref 135–145)
Total Bilirubin: 0.9 mg/dL (ref 0.3–1.2)
Total Protein: 6.6 g/dL (ref 6.5–8.1)

## 2015-05-04 LAB — CBC WITH DIFFERENTIAL/PLATELET
Basophils Absolute: 0 10*3/uL (ref 0.0–0.1)
Basophils Relative: 0 % (ref 0–1)
EOS ABS: 0 10*3/uL (ref 0.0–0.7)
EOS PCT: 0 % (ref 0–5)
HCT: 38.1 % — ABNORMAL LOW (ref 39.0–52.0)
HEMOGLOBIN: 12.4 g/dL — AB (ref 13.0–17.0)
LYMPHS PCT: 18 % (ref 12–46)
Lymphs Abs: 1.7 10*3/uL (ref 0.7–4.0)
MCH: 31.2 pg (ref 26.0–34.0)
MCHC: 32.5 g/dL (ref 30.0–36.0)
MCV: 96 fL (ref 78.0–100.0)
MONOS PCT: 4 % (ref 3–12)
Monocytes Absolute: 0.4 10*3/uL (ref 0.1–1.0)
Neutro Abs: 7.5 10*3/uL (ref 1.7–7.7)
Neutrophils Relative %: 78 % — ABNORMAL HIGH (ref 43–77)
PLATELETS: 255 10*3/uL (ref 150–400)
RBC: 3.97 MIL/uL — ABNORMAL LOW (ref 4.22–5.81)
RDW: 14.1 % (ref 11.5–15.5)
WBC: 9.7 10*3/uL (ref 4.0–10.5)

## 2015-05-04 LAB — I-STAT TROPONIN, ED: Troponin i, poc: 0.01 ng/mL (ref 0.00–0.08)

## 2015-05-04 MED ORDER — SODIUM CHLORIDE 0.9 % IV BOLUS (SEPSIS): 500.0000 mL | Freq: Once | INTRAVENOUS | Status: DC

## 2015-05-04 NOTE — ED Notes (Signed)
Pt returned from CT. Family remains at bedside.

## 2015-05-04 NOTE — ED Notes (Signed)
Pt remains alert and oriented, Pt denies any pain or discomfort

## 2015-05-04 NOTE — ED Notes (Signed)
Pt in CT at this time.

## 2015-05-04 NOTE — ED Provider Notes (Signed)
CSN: [id number]     Arrival date & time 05/04/15  1706 History   First MD Initiated Contact with Patient 05/04/15 1708     Chief Complaint  Patient presents with  . Seizures     (Consider location/radiation/quality/duration/timing/severity/associated sxs/prior Treatment) The history is provided by the patient.  Susan Arana is a 74 y.o. male hx of CKD, HTN, dementia, stroke, ESRD on dialysis (last dialysis was today), here presenting with possible seizure. Patient was in dialysis today and at the end of dialysis, patient suddenly become lightheaded and dizzy and was witnessed to become stiff and then had jerking movements and was confused. He then woke up in the ambulance. He states that he may have lost consciousness denies any chest pain or shortness of breath. Denies any history of seizure in the past.   Level V caveat- AMS   Past Medical History  Diagnosis Date  . CKD III-IV     a. since transplant in 2001 with significant worsening since 06/2013.  Marland Kitchen Hypertension     a. Long h/o HTN, came off of all meds following transplant/wt loss-->meds resumed 06/2013, difficult to control since.  Marland Kitchen History of stroke   . Hypercalcemia     a. 06/2013 Hospitalized in [place].  Marland Kitchen Heart transplanted     a. 1995 CABG x 4 in [place], [place];  b. 2001 Developed CHF;  c. 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 10/2000 Cardiac transplant @ [facility], [place];  e. Historically nl Bx and stress testing.  Last stress test ~ 5 yrs ago.  Marland Kitchen Hernia of abdominal wall   . Hyperlipidemia   . Orthostatic hypotension     a. Has tried support hose and compression stockings - prefers not to wear.  . Coronary artery disease   . Myocardial infarction     PRIOR TO HEART TRANSPLANT  . Shortness of breath     EXERTION  . Orthostatic dizziness   . Stroke 06/2013    NO RESIDUAL - WAS CONFUSED AT TIME - THAT HAS RESOLVED  . Diabetes mellitus without complication     FASTING CBG 90S  . Headache(784.0)   . Cancer     SKIN  .  Anemia   . History of blood transfusion   . Dementia     mild   Past Surgical History  Procedure Laterality Date  . Cardiac surgery    . Heart transplant    . Cataract extraction    . Vasectomy    . Skin graft full thickness leg      cat scratch that did not heal  . Coronary artery bypass graft    . Eye surgery Bilateral     CATARACTS  . Bascilic vein transposition Left 07/06/2014    Procedure: [place];  Surgeon: Rosetta Posner, MD;  Location: [facility] OR;  Service: Vascular;  Laterality: Left;   Family History  Problem Relation Age of Onset  . Heart attack Father     died @ 47  . Diabetes Father   . Heart attack Mother   . Diabetes Mother   . Kidney disease Maternal Aunt    History  Substance Use Topics  . Smoking status: Never Smoker   . Smokeless tobacco: Never Used  . Alcohol Use: No    Review of Systems  Neurological: Positive for seizures.  All other systems reviewed and are negative.     Allergies  Norvasc  Home Medications   Prior to Admission medications  Medication Sig Start Date End Date Taking? Authorizing Provider  acetaminophen (TYLENOL) 325 MG tablet Take 2 tablets (650 mg total) by mouth every 6 (six) hours as needed for mild pain (or Fever >/= 101). 10/14/14  Yes Delfina Redwood, MD  aspirin 81 MG chewable tablet Chew 81 mg by mouth at bedtime.   Yes Historical Provider, MD  cholecalciferol (VITAMIN D) 1000 UNITS tablet Take 1,000 Units by mouth daily.   Yes Historical Provider, MD  cycloSPORINE modified (NEORAL) 25 MG capsule Take 75 mg by mouth 2 (two) times daily.  08/18/14  Yes Lelon Perla, MD  diphenoxylate-atropine (LOMOTIL) 2.5-0.025 MG per tablet Take 1 tablet by mouth 2 (two) times daily as needed.  03/01/15  Yes Historical Provider, MD  donepezil (ARICEPT) 10 MG tablet Take 10 mg by mouth at bedtime.  03/01/15  Yes Historical Provider, MD  FLUoxetine (PROZAC) 20 MG capsule Take 20 mg by mouth daily with breakfast.    Yes Historical Provider, MD  lansoprazole (PREVACID) 15 MG capsule Take 15 mg by mouth daily.    Yes Historical Provider, MD  levothyroxine (SYNTHROID, LEVOTHROID) 75 MCG tablet Take 75 mcg by mouth daily before breakfast.   Yes Historical Provider, MD  metoprolol tartrate (LOPRESSOR) 25 MG tablet Take 2 tablets (50 mg total) by mouth 2 (two) times daily. 12/27/14  Yes Ripudeep Krystal Eaton, MD  multivitamin (RENA-VIT) TABS tablet Take 1 tablet by mouth every morning.  03/08/15  Yes Historical Provider, MD  mycophenolate (CELLCEPT) 250 MG capsule Take 500 mg by mouth 2 (two) times daily.    Yes Historical Provider, MD  pravastatin (PRAVACHOL) 10 MG tablet Take 10 mg by mouth at bedtime.    Yes Historical Provider, MD  vitamin B-12 (CYANOCOBALAMIN) 1000 MCG tablet Take 1,000 mcg by mouth daily with breakfast.   Yes Historical Provider, MD  amoxicillin (AMOXIL) 500 MG tablet Take 1 tablet (500 mg total) by mouth 2 (two) times daily. Patient not taking: Reported on 05/04/2015 04/27/15   Theodis Blaze, MD   BP 180/77 mmHg  Pulse 66  Temp(Src) 98.2 F (36.8 C) (Oral)  Resp 23  SpO2 100% Physical Exam  Constitutional: He is oriented to person, place, and time.  Chronically ill, NAD   HENT:  Head: Normocephalic.  No obvious tongue laceration   Eyes: Conjunctivae are normal. Pupils are equal, round, and reactive to light.  Neck: Normal range of motion. Neck supple.  Cardiovascular: Normal rate, regular rhythm and normal heart sounds.   Pulmonary/Chest: Effort normal and breath sounds normal. No respiratory distress. He has no wheezes. He has no rales.  Abdominal: Soft. Bowel sounds are normal. He exhibits no distension. There is no tenderness. There is no rebound.  Musculoskeletal: Normal range of motion. He exhibits no edema or tenderness.  Neurological: He is alert and oriented to person, place, and time. No cranial nerve deficit. Coordination normal.  Nl strength throughout. CN 2-12 intact   Skin: Skin  is warm and dry.  Psychiatric: He has a normal mood and affect. His behavior is normal. Judgment and thought content normal.  Nursing note and vitals reviewed.   ED Course  Procedures (including critical care time) Labs Review Labs Reviewed  CBC WITH DIFFERENTIAL/PLATELET - Abnormal; Notable for the following:    RBC 3.97 (*)    Hemoglobin 12.4 (*)    HCT 38.1 (*)    Neutrophils Relative % 78 (*)    All other components within normal limits  COMPREHENSIVE  METABOLIC PANEL - Abnormal; Notable for the following:    Chloride 93 (*)    Glucose, Bld 150 (*)    BUN 33 (*)    Creatinine, Ser 4.62 (*)    Calcium 8.8 (*)    AST 45 (*)    GFR calc non Af Amer 11 (*)    GFR calc Af Amer 13 (*)    All other components within normal limits  I-STAT TROPOININ, ED    Imaging Review Ct Head Wo Contrast  05/04/2015   CLINICAL DATA:  Seizure during dialysis.  Initial encounter.  EXAM: CT HEAD WITHOUT CONTRAST  TECHNIQUE: Contiguous axial images were obtained from the base of the skull through the vertex without intravenous contrast.  COMPARISON:  CT of the head performed 01/25/2015  FINDINGS: There is no evidence of acute infarction, mass lesion, or intra- or extra-axial hemorrhage on CT.  Prominence of the ventricles and sulci reflects moderate cortical volume loss. Cerebellar atrophy is noted. Scattered periventricular and subcortical white matter change likely reflects small vessel ischemic microangiopathy.  The brainstem and fourth ventricle are within normal limits. The basal ganglia are unremarkable in appearance. The cerebral hemispheres demonstrate grossly normal gray-white differentiation. No mass effect or midline shift is seen.  There is no evidence of fracture; visualized osseous structures are unremarkable in appearance. The visualized portions of the orbits are within normal limits. The paranasal sinuses and mastoid air cells are well-aerated. No significant soft tissue abnormalities are  seen.  IMPRESSION: 1. No acute intracranial pathology seen on CT. 2. Moderate cortical volume loss and scattered small vessel ischemic microangiopathy.   Electronically Signed   By: Garald Balding M.D.   On: 05/04/2015 20:48     EKG Interpretation None     ED ECG REPORT   Date: 05/04/2015  Rate: 69  Rhythm: normal sinus rhythm  QRS Axis: normal  Intervals: QT prolonged  ST/T Wave abnormalities: normal  Conduction Disutrbances:none  Narrative Interpretation: prolonged QT unchanged. Baseline incomplete RBBB  Old EKG Reviewed: unchanged  I have personally reviewed the EKG tracing and agree with the computerized printout as noted.   MDM   Final diagnoses:  None    Omair Dettmer is a 74 y.o. male here with near syncope vs seizure. Will get labs, CT head. Will consult neuro.   5:30 PM Discussed with Dr. Leonel Ramsay. He states that likely syncope vs electrolyte imbalance. Less likely seizure. Given no hx of seizure, will not start meds right away. Recommend outpatient neuro f/u.   9:33 PM Labs at baseline. CT head showed no bleed. Patient orthostatic and dropped bp to 97 systolic. Had extensive discussion with his wife. He was recently admitted for urinary tract infection and got very confused in the hospital. She also states that he is always orthostatics after his heart transplant. I was initially planning to bring him some IV fluids but he is already hypertensive to 180 laying down. Offered admission but wife really wants to just observe him at home. I told him to follow up with neurology.   Wandra Arthurs, MD 05/04/15 2135

## 2015-05-04 NOTE — Discharge Instructions (Signed)
Continue taking your medicines as prescribed.  See neurology outpatient.   Return to ER if you have another seizure, chest pain, shortness of breath, fever.

## 2015-05-04 NOTE — ED Notes (Signed)
Pt to ED via [facility] from dialysis.  Staff told EMS that pt had a seizure during dialysis.  On arrival to ED pt alert and oriented x's 3 per his norm.  Pt st's he starts feeling bad every time he has dialysis, but st's he feels much better at this time.  Family at bedside

## 2015-05-04 NOTE — ED Notes (Signed)
Patient is very hard of hearing. His better ear is his left.

## 2015-05-19 ENCOUNTER — Telehealth: Payer: Self-pay | Admitting: *Deleted

## 2015-05-19 ENCOUNTER — Ambulatory Visit: Payer: Commercial Managed Care - HMO | Admitting: Podiatry

## 2015-05-19 NOTE — Telephone Encounter (Signed)
Pt's caregiver states she is sick and will not be able to bring pt in today, would like to schedule for a Wednesday or Friday of next week.

## 2015-05-26 ENCOUNTER — Telehealth: Payer: Self-pay | Admitting: Gastroenterology

## 2015-05-26 NOTE — Telephone Encounter (Signed)
FYI

## 2015-05-26 NOTE — Telephone Encounter (Signed)
Specifically, Dr Mercy Moore does not want him to have the colonoscopy due to his fluid restrictions. He is okay for EGD and sedation.

## 2015-05-27 NOTE — Telephone Encounter (Signed)
Left message with his Dialysis doctor to be clear on doing the EGD

## 2015-05-27 NOTE — Telephone Encounter (Signed)
ok 

## 2015-05-28 DIAGNOSIS — R4182 Altered mental status, unspecified: Secondary | ICD-10-CM | POA: Insufficient documentation

## 2015-06-04 ENCOUNTER — Encounter: Payer: 59 | Admitting: Gastroenterology

## 2015-07-21 ENCOUNTER — Ambulatory Visit (INDEPENDENT_AMBULATORY_CARE_PROVIDER_SITE_OTHER): Payer: Medicare HMO | Admitting: Podiatry

## 2015-07-21 ENCOUNTER — Encounter: Payer: Self-pay | Admitting: Podiatry

## 2015-07-21 DIAGNOSIS — B351 Tinea unguium: Secondary | ICD-10-CM | POA: Diagnosis not present

## 2015-07-21 DIAGNOSIS — M79676 Pain in unspecified toe(s): Secondary | ICD-10-CM

## 2015-07-21 NOTE — Patient Instructions (Signed)
Diabetes and Foot Care Diabetes may cause you to have problems because of poor blood supply (circulation) to your feet and legs. This may cause the skin on your feet to become thinner, break easier, and heal more slowly. Your skin may become dry, and the skin may peel and crack. You may also have nerve damage in your legs and feet causing decreased feeling in them. You may not notice minor injuries to your feet that could lead to infections or more serious problems. Taking care of your feet is one of the most important things you can do for yourself.  HOME CARE INSTRUCTIONS  Wear shoes at all times, even in the house. Do not go barefoot. Bare feet are easily injured.  Check your feet daily for blisters, cuts, and redness. If you cannot see the bottom of your feet, use a mirror or ask someone for help.  Wash your feet with warm water (do not use hot water) and mild soap. Then pat your feet and the areas between your toes until they are completely dry. Do not soak your feet as this can dry your skin.  Apply a moisturizing lotion or petroleum jelly (that does not contain alcohol and is unscented) to the skin on your feet and to dry, brittle toenails. Do not apply lotion between your toes.  Trim your toenails straight across. Do not dig under them or around the cuticle. File the edges of your nails with an emery board or nail file.  Do not cut corns or calluses or try to remove them with medicine.  Wear clean socks or stockings every day. Make sure they are not too tight. Do not wear knee-high stockings since they may decrease blood flow to your legs.  Wear shoes that fit properly and have enough cushioning. To break in new shoes, wear them for just a few hours a day. This prevents you from injuring your feet. Always look in your shoes before you put them on to be sure there are no objects inside.  Do not cross your legs. This may decrease the blood flow to your feet.  If you find a minor scrape,  cut, or break in the skin on your feet, keep it and the skin around it clean and dry. These areas may be cleansed with mild soap and water. Do not cleanse the area with peroxide, alcohol, or iodine.  When you remove an adhesive bandage, be sure not to damage the skin around it.  If you have a wound, look at it several times a day to make sure it is healing.  Do not use heating pads or hot water bottles. They may burn your skin. If you have lost feeling in your feet or legs, you may not know it is happening until it is too late.  Make sure your health care provider performs a complete foot exam at least annually or more often if you have foot problems. Report any cuts, sores, or bruises to your health care provider immediately. SEEK MEDICAL CARE IF:   You have an injury that is not healing.  You have cuts or breaks in the skin.  You have an ingrown nail.  You notice redness on your legs or feet.  You feel burning or tingling in your legs or feet.  You have pain or cramps in your legs and feet.  Your legs or feet are numb.  Your feet always feel cold. SEEK IMMEDIATE MEDICAL CARE IF:   There is increasing redness,   swelling, or pain in or around a wound.  There is a red line that goes up your leg.  Pus is coming from a wound.  You develop a fever or as directed by your health care provider.  You notice a bad smell coming from an ulcer or wound. Document Released: 11/03/2000 Document Revised: 07/09/2013 Document Reviewed: 04/15/2013 ExitCare Patient Information 2015 [employer]. This information is not intended to replace advice given to you by your health care provider. Make sure you discuss any questions you have with your health care provider.  

## 2015-07-21 NOTE — Progress Notes (Signed)
Patient ID: [name] [name], male   DOB: 1940-12-07, 74 y.o.   MRN: [id number]  Subjective: This patient presents today complaining of painful toenails walking wearing shoes and requests nail debridement. 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

## 2015-07-30 ENCOUNTER — Encounter: Payer: Self-pay | Admitting: Gastroenterology

## 2015-07-30 ENCOUNTER — Encounter: Payer: 59 | Admitting: Gastroenterology

## 2015-07-30 ENCOUNTER — Ambulatory Visit (AMBULATORY_SURGERY_CENTER): Payer: Medicare HMO | Admitting: Gastroenterology

## 2015-07-30 VITALS — BP 111/54 | HR 77 | Temp 97.7°F | Resp 21 | Ht 74.0 in | Wt 145.0 lb

## 2015-07-30 DIAGNOSIS — K2270 Barrett's esophagus without dysplasia: Secondary | ICD-10-CM

## 2015-07-30 DIAGNOSIS — K2970 Gastritis, unspecified, without bleeding: Secondary | ICD-10-CM

## 2015-07-30 DIAGNOSIS — K2990 Gastroduodenitis, unspecified, without bleeding: Secondary | ICD-10-CM

## 2015-07-30 DIAGNOSIS — K319 Disease of stomach and duodenum, unspecified: Secondary | ICD-10-CM | POA: Diagnosis not present

## 2015-07-30 LAB — GLUCOSE, CAPILLARY: Glucose-Capillary: 108 mg/dL — ABNORMAL HIGH (ref 65–99)

## 2015-07-30 MED ORDER — SODIUM CHLORIDE 0.9 % IV SOLN: 500.0000 mL | INTRAVENOUS | Status: DC

## 2015-07-30 NOTE — Op Note (Signed)
[facility]  [address] [place], [id number]   ENDOSCOPY PROCEDURE REPORT  PATIENT: [name], [name]  MR#: [id number] BIRTHDATE: 06-Apr-1941 , 73  yrs. old GENDER: male ENDOSCOPIST: Inda Castle, MD REFERRED BY:  Fleet Contras, M.D. PROCEDURE DATE:  07/30/2015 PROCEDURE:  EGD w/ biopsy ASA CLASS:     Class III INDICATIONS:  history of Barrett's esophagus and occult blood positive. MEDICATIONS: Monitored anesthesia care and Propofol 180 mg IV TOPICAL ANESTHETIC:  DESCRIPTION OF PROCEDURE: After the risks benefits and alternatives of the procedure were thoroughly explained, informed consent was obtained.  The LB [id number] endoscope was introduced through the mouth and advanced to the second portion of the duodenum , Without limitations.  The instrument was slowly withdrawn as the mucosa was fully examined.    In the gastric antrum there are multiple areas of erosions.  There was a single superficial  linear also.  Multiple biopsies were taken of the surrounding area.Except for the findings listed, the EGD was otherwise normal.  because of a history of Barrett's esophagus multiple biopsies were taken at the GE junction. The scope was then withdrawn from the patient and the procedure completed.  COMPLICATIONS: There were no immediate complications.  ENDOSCOPIC IMPRESSION: 1.  Gastritis 2.  History of Barrett's esophagus with normal-appearing GE junction  Findings to explain Hemoccult-positive stool  RECOMMENDATIONS: 1.  Await biopsy findings 2.  Continue Prevacid 3.  Repeat Hemoccults 4.  Cologuard test (although colonoscopy was originally recommended, nephrology felt that the patient was not a candidate for the colon prep because of concerns about fluid overload)  REPEAT EXAM:  eSigned:  Inda Castle, MD 07/30/2015 2:36 PM    CC:  PATIENT NAME:  [name], [name] MR#: [id number]

## 2015-07-30 NOTE — Progress Notes (Signed)
Called to room to assist during endoscopic procedure.  Patient ID and intended procedure confirmed with present staff. Received instructions for my participation in the procedure from the performing physician.  

## 2015-07-30 NOTE — Patient Instructions (Addendum)
YOU HAD AN ENDOSCOPIC PROCEDURE TODAY AT [facility]:   Refer to the procedure report that was given to you for any specific questions about what was found during the examination.  If the procedure report does not answer your questions, please call your gastroenterologist to clarify.  If you requested that your care partner not be given the details of your procedure findings, then the procedure report has been included in a sealed envelope for you to review at your convenience later.  YOU SHOULD EXPECT: Some feelings of bloating in the abdomen. Passage of more gas than usual.  Walking can help get rid of the air that was put into your GI tract during the procedure and reduce the bloating. If you had a lower endoscopy (such as a colonoscopy or flexible sigmoidoscopy) you may notice spotting of blood in your stool or on the toilet paper. If you underwent a bowel prep for your procedure, you may not have a normal bowel movement for a few days.  Please Note:  You might notice some irritation and congestion in your nose or some drainage.  This is from the oxygen used during your procedure.  There is no need for concern and it should clear up in a day or so.  SYMPTOMS TO REPORT IMMEDIATELY:   Following upper endoscopy (EGD)  Vomiting of blood or coffee ground material  New chest pain or pain under the shoulder blades  Painful or persistently difficult swallowing  New shortness of breath  Fever of 100F or higher  Black, tarry-looking stools  For urgent or emergent issues, a gastroenterologist can be reached at any hour by calling [phone].   DIET: Your first meal following the procedure should be a small meal and then it is ok to progress to your normal diet. Heavy or fried foods are harder to digest and may make you feel nauseous or bloated.  Likewise, meals heavy in dairy and vegetables can increase bloating.  Drink plenty of fluids but you should avoid alcoholic beverages for  24 hours.  ACTIVITY:  You should plan to take it easy for the rest of today and you should NOT DRIVE or use heavy machinery until tomorrow (because of the sedation medicines used during the test).    FOLLOW UP: Our staff will call the number listed on your records the next business day following your procedure to check on you and address any questions or concerns that you may have regarding the information given to you following your procedure. If we do not reach you, we will leave a message.  However, if you are feeling well and you are not experiencing any problems, there is no need to return our call.  We will assume that you have returned to your regular daily activities without incident.  If any biopsies were taken you will be contacted by phone or by letter within the next 1-3 weeks.  Please call us at [phone] if you have not heard about the biopsies in 3 weeks.    SIGNATURES/CONFIDENTIALITY: You and/or your care partner have signed paperwork which will be entered into your electronic medical record.  These signatures attest to the fact that that the information above on your After Visit Summary has been reviewed and is understood.  Full responsibility of the confidentiality of this discharge information lies with you and/or your care-partner.  H[facility] card given with directions, please review instructions inside flap for detailed instructions.

## 2015-07-30 NOTE — Progress Notes (Signed)
To recovery, report to Myers, RN, VSS. 

## 2015-08-02 ENCOUNTER — Telehealth: Payer: Self-pay | Admitting: *Deleted

## 2015-08-02 LAB — GLUCOSE, CAPILLARY: GLUCOSE-CAPILLARY: 120 mg/dL — AB (ref 65–99)

## 2015-08-02 NOTE — Telephone Encounter (Signed)
  Follow up Call-  Call back number 07/30/2015  Post procedure Call Back phone  # [phone] (H)  Permission to leave phone message Yes     Patient questions:  Do you have a fever, pain , or abdominal swelling? No. Pain Score  0 *  Have you tolerated food without any problems? Yes.    Have you been able to return to your normal activities? Yes.    Do you have any questions about your discharge instructions: Diet   No. Medications  No. Follow up visit  No.  Do you have questions or concerns about your Care? No.  Actions: * If pain score is 4 or above: No action needed, pain <4.

## 2015-08-04 ENCOUNTER — Other Ambulatory Visit: Payer: Self-pay

## 2015-08-04 DIAGNOSIS — R195 Other fecal abnormalities: Secondary | ICD-10-CM

## 2015-08-12 ENCOUNTER — Encounter: Payer: Self-pay | Admitting: Gastroenterology

## 2015-08-23 ENCOUNTER — Other Ambulatory Visit (INDEPENDENT_AMBULATORY_CARE_PROVIDER_SITE_OTHER): Payer: Medicare HMO

## 2015-08-23 ENCOUNTER — Other Ambulatory Visit: Payer: Self-pay

## 2015-08-23 DIAGNOSIS — R195 Other fecal abnormalities: Secondary | ICD-10-CM

## 2015-08-23 DIAGNOSIS — D649 Anemia, unspecified: Secondary | ICD-10-CM

## 2015-08-24 LAB — HEMOCCULT SLIDES (X 3 CARDS)
Fecal Occult Blood: NEGATIVE
OCCULT 1: NEGATIVE
OCCULT 2: NEGATIVE
OCCULT 3: NEGATIVE
OCCULT 4: NEGATIVE
OCCULT 5: NEGATIVE

## 2015-09-29 ENCOUNTER — Other Ambulatory Visit: Payer: Self-pay | Admitting: Dermatology

## 2015-10-13 ENCOUNTER — Emergency Department (HOSPITAL_COMMUNITY): Payer: Medicare HMO

## 2015-10-13 ENCOUNTER — Emergency Department (HOSPITAL_COMMUNITY)
Admission: EM | Admit: 2015-10-13 | Discharge: 2015-10-14 | Disposition: A | Discharge status: Home / Self Care | Payer: Medicare HMO | Attending: Emergency Medicine | Admitting: Emergency Medicine

## 2015-10-13 ENCOUNTER — Encounter (HOSPITAL_COMMUNITY): Payer: Self-pay | Admitting: Emergency Medicine

## 2015-10-13 DIAGNOSIS — Z7982 Long term (current) use of aspirin: Secondary | ICD-10-CM | POA: Diagnosis not present

## 2015-10-13 DIAGNOSIS — F0390 Unspecified dementia without behavioral disturbance: Secondary | ICD-10-CM | POA: Diagnosis not present

## 2015-10-13 DIAGNOSIS — E785 Hyperlipidemia, unspecified: Secondary | ICD-10-CM | POA: Insufficient documentation

## 2015-10-13 DIAGNOSIS — E119 Type 2 diabetes mellitus without complications: Secondary | ICD-10-CM | POA: Diagnosis not present

## 2015-10-13 DIAGNOSIS — M25562 Pain in left knee: Secondary | ICD-10-CM

## 2015-10-13 DIAGNOSIS — I2510 Atherosclerotic heart disease of native coronary artery without angina pectoris: Secondary | ICD-10-CM | POA: Insufficient documentation

## 2015-10-13 DIAGNOSIS — Z862 Personal history of diseases of the blood and blood-forming organs and certain disorders involving the immune mechanism: Secondary | ICD-10-CM | POA: Diagnosis not present

## 2015-10-13 DIAGNOSIS — I129 Hypertensive chronic kidney disease with stage 1 through stage 4 chronic kidney disease, or unspecified chronic kidney disease: Secondary | ICD-10-CM | POA: Insufficient documentation

## 2015-10-13 DIAGNOSIS — Z85828 Personal history of other malignant neoplasm of skin: Secondary | ICD-10-CM | POA: Insufficient documentation

## 2015-10-13 DIAGNOSIS — Z8673 Personal history of transient ischemic attack (TIA), and cerebral infarction without residual deficits: Secondary | ICD-10-CM | POA: Insufficient documentation

## 2015-10-13 DIAGNOSIS — Z941 Heart transplant status: Secondary | ICD-10-CM | POA: Diagnosis not present

## 2015-10-13 DIAGNOSIS — Z79899 Other long term (current) drug therapy: Secondary | ICD-10-CM | POA: Insufficient documentation

## 2015-10-13 DIAGNOSIS — N184 Chronic kidney disease, stage 4 (severe): Secondary | ICD-10-CM | POA: Insufficient documentation

## 2015-10-13 MED ORDER — HYDROCODONE-ACETAMINOPHEN 5-325 MG PO TABS
1.0000 | ORAL_TABLET | Freq: Once | ORAL | Status: AC
  Administered 2015-10-13: 1 via ORAL
  Filled 2015-10-13: qty 1

## 2015-10-13 NOTE — ED Notes (Signed)
Pt states he has falling four times in the last 2 days. Pt states his left knee is just giving out on him while he is walking and he's falling to the ground. Pt c/o of pain in his left knee down into calf. Pt also has pain in right lower leg. Pt denies any LOC.

## 2015-10-13 NOTE — ED Provider Notes (Signed)
CSN: [id number]     Arrival date & time 10/13/15  2117 History   First MD Initiated Contact with Patient 10/13/15 2139     Chief Complaint  Patient presents with  . Leg Pain  HPI [name] [name] is a 74 y.o. M PMH significant for CKD, HTN, CVA, HLD, MI, DM presenting with a chronic history of left knee pain. He describes his left knee pain as 8/10, achy, sore, constant, chronic but exacerbated by falling four times within the last 2 days. He endorses right knee pain as well, but states this is a chronic issue. He is supposed to be walking with a walker or a cane, but he has been non-compliant with this. During his falls, he has landed on his left knee and left hip. He denies LOC, head injury, AMS, dizziness, CP, abdominal pain, N/V/D, loss of bladder/bowel control.   Past Medical History  Diagnosis Date  . CKD III-IV     a. since transplant in 2001 with significant worsening since 06/2013.  Marland Kitchen Hypertension     a. Long h/o HTN, came off of all meds following transplant/wt loss-->meds resumed 06/2013, difficult to control since.  Marland Kitchen History of stroke   . Hypercalcemia     a. 06/2013 Hospitalized in [place].  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 1995 CABG x 4 in [place], [place];  b. 2001 Developed CHF;  c. 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 10/2000 Cardiac transplant @ [facility], [place];  e. Historically nl Bx and stress testing.  Last stress test ~ 5 yrs ago.  Marland Kitchen Hernia of abdominal wall   . Hyperlipidemia   . Orthostatic hypotension     a. Has tried support hose and compression stockings - prefers not to wear.  . Coronary artery disease   . Myocardial infarction ([place])     PRIOR TO HEART TRANSPLANT  . Shortness of breath     EXERTION  . Orthostatic dizziness   . Stroke ([place]) 06/2013    NO RESIDUAL - WAS CONFUSED AT TIME - THAT HAS RESOLVED  . Diabetes mellitus without complication (H[facility])     FASTING CBG 90S  . Headache(784.0)   . Cancer ([place])     SKIN  . Anemia   . History of blood transfusion    . Dementia     mild   Past Surgical History  Procedure Laterality Date  . Cardiac surgery    . Heart transplant    . Cataract extraction    . Vasectomy    . Skin graft full thickness leg      cat scratch that did not heal  . Coronary artery bypass graft    . Eye surgery Bilateral     CATARACTS  . Bascilic vein transposition Left 07/06/2014    Procedure: Candelaria Arenas;  Surgeon: Rosetta Posner, MD;  Location: [facility] OR;  Service: Vascular;  Laterality: Left;   Family History  Problem Relation Age of Onset  . Heart attack Father     died @ 71  . Diabetes Father   . Heart attack Mother   . Diabetes Mother   . Kidney disease Maternal Aunt    Social History  Substance Use Topics  . Smoking status: Never Smoker   . Smokeless tobacco: Never Used  . Alcohol Use: No    Review of Systems  Ten systems are reviewed and are negative for acute change except as noted in the HPI   Allergies  Norvasc  Home Medications   Prior to Admission medications   Medication Sig Start Date End Date Taking? Authorizing Provider  acetaminophen (TYLENOL) 325 MG tablet Take 2 tablets (650 mg total) by mouth every 6 (six) hours as needed for mild pain (or Fever >/= 101). 10/14/14   Delfina Redwood, MD  amoxicillin (AMOXIL) 500 MG tablet Take 1 tablet (500 mg total) by mouth 2 (two) times daily. Patient not taking: Reported on 07/30/2015 04/27/15   Theodis Blaze, MD  aspirin 81 MG chewable tablet Chew 81 mg by mouth at bedtime.    Historical Provider, MD  cholecalciferol (VITAMIN D) 1000 UNITS tablet Take 1,000 Units by mouth daily.    Historical Provider, MD  cycloSPORINE modified (NEORAL) 25 MG capsule Take 75 mg by mouth 2 (two) times daily.  08/18/14   Lelon Perla, MD  diphenoxylate-atropine (LOMOTIL) 2.5-0.025 MG per tablet Take 1 tablet by mouth 2 (two) times daily as needed.  03/01/15   Historical Provider, MD  donepezil (ARICEPT) 10 MG tablet Take 10 mg by mouth at bedtime.   03/01/15   Historical Provider, MD  FLUoxetine (PROZAC) 20 MG capsule Take 20 mg by mouth daily with breakfast.    Historical Provider, MD  lansoprazole (PREVACID) 15 MG capsule Take 15 mg by mouth daily.     Historical Provider, MD  levothyroxine (SYNTHROID, LEVOTHROID) 75 MCG tablet Take 75 mcg by mouth daily before breakfast.    Historical Provider, MD  metoprolol tartrate (LOPRESSOR) 25 MG tablet Take 2 tablets (50 mg total) by mouth 2 (two) times daily. 12/27/14   Ripudeep Krystal Eaton, MD  multivitamin (RENA-VIT) TABS tablet Take 1 tablet by mouth every morning.  03/08/15   Historical Provider, MD  mycophenolate (CELLCEPT) 250 MG capsule Take 500 mg by mouth 2 (two) times daily.     Historical Provider, MD  pravastatin (PRAVACHOL) 10 MG tablet Take 10 mg by mouth at bedtime.     Historical Provider, MD  vitamin B-12 (CYANOCOBALAMIN) 1000 MCG tablet Take 1,000 mcg by mouth daily with breakfast.    Historical Provider, MD   BP 152/89 mmHg  Pulse 81  Temp(Src) 97.8 F (36.6 C) (Oral)  Resp 16  SpO2 100% Physical Exam  Constitutional: He is oriented to person, place, and time. He appears well-developed and well-nourished. No distress.  HENT:  Head: Normocephalic and atraumatic.  Right Ear: External ear normal.  Left Ear: External ear normal.  Nose: Nose normal.  Mouth/Throat: Oropharynx is clear and moist. No oropharyngeal exudate.  Eyes: Conjunctivae are normal. Pupils are equal, round, and reactive to light. Right eye exhibits no discharge. Left eye exhibits no discharge. No scleral icterus.  Neck: Normal range of motion. No tracheal deviation present.  Cardiovascular: Normal rate, regular rhythm, normal heart sounds and intact distal pulses.  Exam reveals no gallop and no friction rub.   No murmur heard. Pulmonary/Chest: Effort normal and breath sounds normal. No respiratory distress. He has no wheezes. He has no rales. He exhibits no tenderness.  Abdominal: Soft. Bowel sounds are normal. He  exhibits no distension and no mass. There is no tenderness. There is no rebound and no guarding.  Musculoskeletal: Normal range of motion. He exhibits tenderness. He exhibits no edema.  Bilateral knee tenderness. No erythema, edema, deformities noted.   Lymphadenopathy:    He has no cervical adenopathy.  Neurological: He is alert and oriented to person, place, and time. No cranial nerve deficit. Coordination normal.  Skin: Skin is warm  and dry. No rash noted. He is not diaphoretic. No erythema.  Psychiatric: He has a normal mood and affect. His behavior is normal.  Nursing note and vitals reviewed.   ED Course  Procedures  Imaging Review Dg Knee Complete 4 Views Left  10/13/2015  CLINICAL DATA:  Severe left knee pain, acute onset. Multiple recent falls. Initial encounter. EXAM: LEFT KNEE - COMPLETE 4+ VIEW COMPARISON:  None. FINDINGS: There is no evidence of fracture or dislocation. The joint spaces are preserved. Mild cortical irregularity is noted along the articular surface of the patella. Chondrocalcinosis is noted. No significant joint effusion is seen. Numerous clips and diffuse vascular calcifications are seen. IMPRESSION: 1. No evidence of fracture or dislocation. 2. Chondrocalcinosis noted. 3. Diffuse vascular calcifications seen. Electronically Signed   By: Garald Balding M.D.   On: 10/13/2015 22:11   Dg Hip Unilat With Pelvis 2-3 Views Left  10/14/2015  CLINICAL DATA:  Status post multiple recent falls, with bilateral leg pain. Initial encounter. EXAM: DG HIP (WITH OR WITHOUT PELVIS) 2-3V LEFT COMPARISON:  None. FINDINGS: There is no evidence of fracture or dislocation. Both femoral heads are seated normally within their respective acetabula. The proximal left femur appears intact. Degenerative change is noted at the lower lumbar spine. The sacroiliac joints are unremarkable in appearance. The visualized bowel gas pattern is grossly unremarkable in appearance. Postoperative change is  noted at the left inguinal region. Diffuse vascular calcifications are seen. IMPRESSION: 1. No evidence of fracture or dislocation. 2. Diffuse vascular calcifications seen. Electronically Signed   By: Garald Balding M.D.   On: 10/14/2015 00:59   I have personally reviewed and evaluated these images and lab results as part of my medical decision-making. 10:00 PM Patient in XR  MDM   Final diagnoses:  Knee pain, acute, left   Patient non-toxic appearing and VSS. According to patient, he has had an extensive workup for falls within the past year. These falls appear to be mechanical in nature. Less likely DVT. Will xray areas of concern and reassess.  Patient feeling better after pain medication. Dr. Alfonse Spruce evaluated patient as well and she advised walking the patient. Patient provided walker. Recommend bilateral knee braces and walker at home. Dr. Alfonse Spruce educated patient on importance of walker compliance. Patient may be safely discharged home. Discussed reasons for return. Patient to follow-up with primary care provider within one week. Patient in understanding and agreement with the plan.    Darrington Lions, PA-C 10/28/15 1615  Harvel Quale, MD 11/05/15 [phone]

## 2015-10-14 ENCOUNTER — Emergency Department (HOSPITAL_COMMUNITY): Payer: Medicare HMO

## 2015-10-14 DIAGNOSIS — M25562 Pain in left knee: Secondary | ICD-10-CM | POA: Diagnosis not present

## 2015-10-14 MED ORDER — HYDROCODONE-ACETAMINOPHEN 5-325 MG PO TABS
1.0000 | ORAL_TABLET | Freq: Once | ORAL | Status: AC
  Administered 2015-10-14: 1 via ORAL
  Filled 2015-10-14: qty 1

## 2015-10-14 MED ORDER — HYDROCODONE-ACETAMINOPHEN 5-325 MG PO TABS: 1.0000 | ORAL_TABLET | Freq: Four times a day (QID) | ORAL | Status: DC | PRN

## 2015-10-14 NOTE — Discharge Instructions (Signed)
[name], [name] [name] meeting you! Return to the emergency department if your ability to walk decreases, your pain increases, or you have recurrent falls. Use your walker at home. Please follow-up with your primary care provider within one week. Feel better soon and Happy Thanksgiving!  S. Wendie Simmer, PA-C  Joint Pain Joint pain, which is also called arthralgia, can be caused by many things. Joint pain often goes away when you follow your health care provider's instructions for relieving pain at home. However, joint pain can also be caused by conditions that require further treatment. Common causes of joint pain include:  Bruising in the area of the joint.  Overuse of the joint.  Wear and tear on the joints that occur with aging (osteoarthritis).  Various other forms of arthritis.  A buildup of a crystal form of uric acid in the joint (gout).  Infections of the joint (septic arthritis) or of the bone (osteomyelitis). Your health care provider may recommend medicine to help with the pain. If your joint pain continues, additional tests may be needed to diagnose your condition. HOME CARE INSTRUCTIONS Watch your condition for any changes. Follow these instructions as directed to lessen the pain that you are feeling.  Take medicines only as directed by your health care provider.  Rest the affected area for as long as your health care provider says that you should. If directed to do so, raise the painful joint above the level of your heart while you are sitting or lying down.  Do not do things that cause or worsen pain.  If directed, apply ice to the painful area:  Put ice in a plastic bag.  Place a towel between your skin and the bag.  Leave the ice on for 20 minutes, 2-3 times per day.  Wear an elastic bandage, splint, or sling as directed by your health care provider. Loosen the elastic bandage or splint if your fingers or toes become numb and tingle, or if they turn cold and  blue.  Begin exercising or stretching the affected area as directed by your health care provider. Ask your health care provider what types of exercise are safe for you.  Keep all follow-up visits as directed by your health care provider. This is important. SEEK MEDICAL CARE IF:  Your pain increases, and medicine does not help.  Your joint pain does not improve within 3 days.  You have increased bruising or swelling.  You have a fever.  You lose 10 lb (4.5 kg) or more without trying. SEEK IMMEDIATE MEDICAL CARE IF:  You are not able to move the joint.  Your fingers or toes become numb or they turn cold and blue.   This information is not intended to replace advice given to you by your health care provider. Make sure you discuss any questions you have with your health care provider.   Document Released: 11/06/2005 Document Revised: 11/27/2014 Document Reviewed: 08/18/2014 Elsevier Interactive Patient Education [employer].

## 2015-10-20 ENCOUNTER — Ambulatory Visit: Payer: 59 | Admitting: Podiatry

## 2015-11-17 ENCOUNTER — Encounter: Payer: Self-pay | Admitting: Podiatry

## 2015-11-17 ENCOUNTER — Ambulatory Visit (INDEPENDENT_AMBULATORY_CARE_PROVIDER_SITE_OTHER): Payer: 59 | Admitting: Podiatry

## 2015-11-17 DIAGNOSIS — M79676 Pain in unspecified toe(s): Secondary | ICD-10-CM

## 2015-11-17 DIAGNOSIS — B351 Tinea unguium: Secondary | ICD-10-CM | POA: Diagnosis not present

## 2015-11-17 NOTE — Patient Instructions (Signed)
Use over-the-counter Vicks VapoRub and rub into toes daily until toenail soften  Diabetes and Foot Care Diabetes may cause you to have problems because of poor blood supply (circulation) to your feet and legs. This may cause the skin on your feet to become thinner, break easier, and heal more slowly. Your skin may become dry, and the skin may peel and crack. You may also have nerve damage in your legs and feet causing decreased feeling in them. You may not notice minor injuries to your feet that could lead to infections or more serious problems. Taking care of your feet is one of the most important things you can do for yourself.  HOME CARE INSTRUCTIONS  Wear shoes at all times, even in the house. Do not go barefoot. Bare feet are easily injured.  Check your feet daily for blisters, cuts, and redness. If you cannot see the bottom of your feet, use a mirror or ask someone for help.  Wash your feet with warm water (do not use hot water) and mild soap. Then pat your feet and the areas between your toes until they are completely dry. Do not soak your feet as this can dry your skin.  Apply a moisturizing lotion or petroleum jelly (that does not contain alcohol and is unscented) to the skin on your feet and to dry, brittle toenails. Do not apply lotion between your toes.  Trim your toenails straight across. Do not dig under them or around the cuticle. File the edges of your nails with an emery board or nail file.  Do not cut corns or calluses or try to remove them with medicine.  Wear clean socks or stockings every day. Make sure they are not too tight. Do not wear knee-high stockings since they may decrease blood flow to your legs.  Wear shoes that fit properly and have enough cushioning. To break in new shoes, wear them for just a few hours a day. This prevents you from injuring your feet. Always look in your shoes before you put them on to be sure there are no objects inside.  Do not cross your  legs. This may decrease the blood flow to your feet.  If you find a minor scrape, cut, or break in the skin on your feet, keep it and the skin around it clean and dry. These areas may be cleansed with mild soap and water. Do not cleanse the area with peroxide, alcohol, or iodine.  When you remove an adhesive bandage, be sure not to damage the skin around it.  If you have a wound, look at it several times a day to make sure it is healing.  Do not use heating pads or hot water bottles. They may burn your skin. If you have lost feeling in your feet or legs, you may not know it is happening until it is too late.  Make sure your health care provider performs a complete foot exam at least annually or more often if you have foot problems. Report any cuts, sores, or bruises to your health care provider immediately. SEEK MEDICAL CARE IF:   You have an injury that is not healing.  You have cuts or breaks in the skin.  You have an ingrown nail.  You notice redness on your legs or feet.  You feel burning or tingling in your legs or feet.  You have pain or cramps in your legs and feet.  Your legs or feet are numb.  Your feet always  feel cold. SEEK IMMEDIATE MEDICAL CARE IF:   There is increasing redness, swelling, or pain in or around a wound.  There is a red line that goes up your leg.  Pus is coming from a wound.  You develop a fever or as directed by your health care provider.  You notice a bad smell coming from an ulcer or wound.   This information is not intended to replace advice given to you by your health care provider. Make sure you discuss any questions you have with your health care provider.   Document Released: 11/03/2000 Document Revised: 07/09/2013 Document Reviewed: 04/15/2013 Elsevier Interactive Patient Education [employer].

## 2015-11-17 NOTE — Progress Notes (Signed)
Patient ID: [name] [name], male   DOB: [birth year], 74 y.o.   MRN: [id number]  Subjective: This patient presents complaining of painful toenails when walking wearing shoes and as well as at nighttime. He denies any infection around the nails, however describes a rather persistent discomfort which is temporary relief when the nails are debrided  Objective: Orientated 3 No peripheral edema bilaterally DP and PT pulses 0/4 bilaterally  Sensation to 10 g monofilament wire intact 5/5 bilaterally Vibratory sensation reactive bilaterally Ankle reflex equal reactive bilaterally  Dermatological: Eschar base fifth right metatarsal without any surrounding inflammation Dry atrophic skin bilaterally The toenails are brittle, discolored, incurvated 6-10 without any obvious erythema, edema, drainage  No deformities noted  Assessment: Decrease pedal pulses suggestive peripheral arterial disease 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

## 2016-02-16 ENCOUNTER — Other Ambulatory Visit: Payer: Self-pay | Admitting: Dermatology

## 2016-02-23 ENCOUNTER — Ambulatory Visit: Payer: 59 | Admitting: Podiatry

## 2016-02-24 ENCOUNTER — Emergency Department (HOSPITAL_COMMUNITY)
Admission: EM | Admit: 2016-02-24 | Discharge: 2016-02-24 | Disposition: A | Discharge status: Home / Self Care | Payer: Medicare HMO | Attending: Emergency Medicine | Admitting: Emergency Medicine

## 2016-02-24 ENCOUNTER — Encounter (HOSPITAL_COMMUNITY): Payer: Self-pay | Admitting: Emergency Medicine

## 2016-02-24 ENCOUNTER — Emergency Department (HOSPITAL_COMMUNITY): Payer: Medicare HMO

## 2016-02-24 DIAGNOSIS — Z862 Personal history of diseases of the blood and blood-forming organs and certain disorders involving the immune mechanism: Secondary | ICD-10-CM | POA: Insufficient documentation

## 2016-02-24 DIAGNOSIS — I129 Hypertensive chronic kidney disease with stage 1 through stage 4 chronic kidney disease, or unspecified chronic kidney disease: Secondary | ICD-10-CM | POA: Diagnosis not present

## 2016-02-24 DIAGNOSIS — S0990XA Unspecified injury of head, initial encounter: Secondary | ICD-10-CM | POA: Diagnosis present

## 2016-02-24 DIAGNOSIS — W01198A Fall on same level from slipping, tripping and stumbling with subsequent striking against other object, initial encounter: Secondary | ICD-10-CM | POA: Diagnosis not present

## 2016-02-24 DIAGNOSIS — N183 Chronic kidney disease, stage 3 (moderate): Secondary | ICD-10-CM | POA: Diagnosis not present

## 2016-02-24 DIAGNOSIS — Y998 Other external cause status: Secondary | ICD-10-CM | POA: Diagnosis not present

## 2016-02-24 DIAGNOSIS — I2510 Atherosclerotic heart disease of native coronary artery without angina pectoris: Secondary | ICD-10-CM | POA: Insufficient documentation

## 2016-02-24 DIAGNOSIS — Z8673 Personal history of transient ischemic attack (TIA), and cerebral infarction without residual deficits: Secondary | ICD-10-CM | POA: Diagnosis not present

## 2016-02-24 DIAGNOSIS — Y9289 Other specified places as the place of occurrence of the external cause: Secondary | ICD-10-CM | POA: Insufficient documentation

## 2016-02-24 DIAGNOSIS — S0181XA Laceration without foreign body of other part of head, initial encounter: Secondary | ICD-10-CM | POA: Diagnosis not present

## 2016-02-24 DIAGNOSIS — E119 Type 2 diabetes mellitus without complications: Secondary | ICD-10-CM | POA: Insufficient documentation

## 2016-02-24 DIAGNOSIS — W19XXXA Unspecified fall, initial encounter: Secondary | ICD-10-CM

## 2016-02-24 DIAGNOSIS — Z792 Long term (current) use of antibiotics: Secondary | ICD-10-CM | POA: Insufficient documentation

## 2016-02-24 DIAGNOSIS — I252 Old myocardial infarction: Secondary | ICD-10-CM | POA: Diagnosis not present

## 2016-02-24 DIAGNOSIS — S065X0A Traumatic subdural hemorrhage without loss of consciousness, initial encounter: Secondary | ICD-10-CM | POA: Insufficient documentation

## 2016-02-24 DIAGNOSIS — Z79899 Other long term (current) drug therapy: Secondary | ICD-10-CM | POA: Insufficient documentation

## 2016-02-24 DIAGNOSIS — Y9389 Activity, other specified: Secondary | ICD-10-CM | POA: Diagnosis not present

## 2016-02-24 DIAGNOSIS — Z941 Heart transplant status: Secondary | ICD-10-CM | POA: Insufficient documentation

## 2016-02-24 DIAGNOSIS — Z7982 Long term (current) use of aspirin: Secondary | ICD-10-CM | POA: Diagnosis not present

## 2016-02-24 DIAGNOSIS — E785 Hyperlipidemia, unspecified: Secondary | ICD-10-CM | POA: Diagnosis not present

## 2016-02-24 DIAGNOSIS — I6203 Nontraumatic chronic subdural hemorrhage: Secondary | ICD-10-CM

## 2016-02-24 DIAGNOSIS — F0390 Unspecified dementia without behavioral disturbance: Secondary | ICD-10-CM | POA: Diagnosis not present

## 2016-02-24 DIAGNOSIS — Z85828 Personal history of other malignant neoplasm of skin: Secondary | ICD-10-CM | POA: Insufficient documentation

## 2016-02-24 DIAGNOSIS — Z951 Presence of aortocoronary bypass graft: Secondary | ICD-10-CM | POA: Diagnosis not present

## 2016-02-24 MED ORDER — LIDOCAINE-EPINEPHRINE (PF) 2 %-1:200000 IJ SOLN
10.0000 mL | Freq: Once | INTRAMUSCULAR | Status: AC
  Administered 2016-02-24: 10 mL
  Filled 2016-02-24: qty 20

## 2016-02-24 NOTE — ED Provider Notes (Signed)
CSN: [id number]     Arrival date & time 02/24/16  1142 History   First MD Initiated Contact with Patient 02/24/16 1154     Chief Complaint  Patient presents with  . Fall  . Head Injury     (Consider location/radiation/quality/duration/timing/severity/associated sxs/prior Treatment) Patient is a 75 y.o. male presenting with fall and head injury. The history is provided by the patient.  Fall This is a new problem. Pertinent negatives include no chest pain, no headaches and no shortness of breath.  Head Injury Associated symptoms: no headaches   Patient lost his balance and fell, striking his right head on the ground. Also landed on right shoulder. No loss conscious. Last tetanus was 3 years ago. No confusion. No headache. No dominant pain or chest pain. He is not on anticoagulation. No vision changes.  Past Medical History  Diagnosis Date  . CKD III-IV     a. since transplant in 2001 with significant worsening since 06/2013.  Marland Kitchen Hypertension     a. Long h/o HTN, came off of all meds following transplant/wt loss-->meds resumed 06/2013, difficult to control since.  Marland Kitchen History of stroke   . Hypercalcemia     a. 06/2013 Hospitalized in [place].  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 1995 CABG x 4 in [place], [place];  b. 2001 Developed CHF;  c. 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 10/2000 Cardiac transplant @ [facility], [place];  e. Historically nl Bx and stress testing.  Last stress test ~ 5 yrs ago.  Marland Kitchen Hernia of abdominal wall   . Hyperlipidemia   . Orthostatic hypotension     a. Has tried support hose and compression stockings - prefers not to wear.  . Coronary artery disease   . Myocardial infarction ([place])     PRIOR TO HEART TRANSPLANT  . Shortness of breath     EXERTION  . Orthostatic dizziness   . Stroke ([name]) 06/2013    NO RESIDUAL - WAS CONFUSED AT TIME - THAT HAS RESOLVED  . Diabetes mellitus without complication ([facility])     FASTING CBG 90S  . Headache(784.0)   . Cancer ([place])     SKIN  .  Anemia   . History of blood transfusion   . Dementia     mild   Past Surgical History  Procedure Laterality Date  . Cardiac surgery    . Heart transplant    . Cataract extraction    . Vasectomy    . Skin graft full thickness leg      cat scratch that did not heal  . Coronary artery bypass graft    . Eye surgery Bilateral     CATARACTS  . Bascilic vein transposition Left 07/06/2014    Procedure: [place];  Surgeon: Rosetta Posner, MD;  Location: [facility] OR;  Service: Vascular;  Laterality: Left;   Family History  Problem Relation Age of Onset  . Heart attack Father     died @ 3  . Diabetes Father   . Heart attack Mother   . Diabetes Mother   . Kidney disease Maternal Aunt    Social History  Substance Use Topics  . Smoking status: Never Smoker   . Smokeless tobacco: Never Used  . Alcohol Use: No    Review of Systems  Constitutional: Negative for appetite change.  Respiratory: Negative for shortness of breath.   Cardiovascular: Negative for chest pain.  Musculoskeletal: Negative for back pain.  Right shoulder pain.  Skin: Positive for wound.  Neurological: Negative for headaches.      Allergies  Amlodipine and Norvasc  Home Medications   Prior to Admission medications   Medication Sig Start Date End Date Taking? Authorizing Provider  acetaminophen (TYLENOL) 325 MG tablet Take 2 tablets (650 mg total) by mouth every 6 (six) hours as needed for mild pain (or Fever >/= 101). 10/14/14  Yes Delfina Redwood, MD  aspirin 81 MG chewable tablet Chew 81 mg by mouth at bedtime.   Yes Historical Provider, MD  cholecalciferol (VITAMIN D) 1000 UNITS tablet Take 1,000 Units by mouth daily.   Yes Historical Provider, MD  cycloSPORINE modified (NEORAL) 25 MG capsule Take 75 mg by mouth 2 (two) times daily.  08/18/14  Yes Lelon Perla, MD  diphenoxylate-atropine (LOMOTIL) 2.5-0.025 MG per tablet Take 1 tablet by mouth 2 (two) times daily as needed for  diarrhea or loose stools.  03/01/15  Yes Historical Provider, MD  donepezil (ARICEPT) 10 MG tablet Take 10 mg by mouth at bedtime.  03/01/15  Yes Historical Provider, MD  FLUoxetine (PROZAC) 20 MG capsule Take 20 mg by mouth daily with breakfast.   Yes Historical Provider, MD  lansoprazole (PREVACID) 15 MG capsule Take 30 mg by mouth 2 (two) times daily before a meal.    Yes Historical Provider, MD  levothyroxine (SYNTHROID, LEVOTHROID) 75 MCG tablet Take 75 mcg by mouth daily before breakfast.   Yes Historical Provider, MD  metoprolol tartrate (LOPRESSOR) 25 MG tablet Take 2 tablets (50 mg total) by mouth 2 (two) times daily. Patient taking differently: Take 25 mg by mouth 2 (two) times daily. mwf 12/27/14  Yes Ripudeep Krystal Eaton, MD  multivitamin (RENA-VIT) TABS tablet Take 1 tablet by mouth every morning.  03/08/15  Yes Historical Provider, MD  mycophenolate (CELLCEPT) 250 MG capsule Take 250-500 mg by mouth 2 (two) times daily. 500 mg in the morning and 250 every evening   Yes Historical Provider, MD  pravastatin (PRAVACHOL) 10 MG tablet Take 10 mg by mouth at bedtime.    Yes Historical Provider, MD  vitamin B-12 (CYANOCOBALAMIN) 1000 MCG tablet Take 1,000 mcg by mouth daily with breakfast.   Yes Historical Provider, MD  amoxicillin (AMOXIL) 500 MG tablet Take 1 tablet (500 mg total) by mouth 2 (two) times daily. 04/27/15   Theodis Blaze, MD  HYDROcodone-acetaminophen (NORCO) 5-325 MG tablet Take 1 tablet by mouth every 6 (six) hours as needed for moderate pain. 10/14/15   Gifford Lions, PA-C   BP 224/87 mmHg  Pulse 68  Temp(Src) 97.6 F (36.4 C) (Axillary)  Resp 19  Ht 6\' 2"  (1.88 m)  Wt 145 lb (65.772 kg)  BMI 18.61 kg/m2  SpO2 100% Physical Exam  Constitutional: He appears well-developed.  HENT:  Hematoma to right frontal and lateral forehead. Approximately 5 cm. Is a 1 cm laceration in the middle of it. It is raised. Extraocular movements intact.  Eyes: EOM are normal. Pupils are  equal, round, and reactive to light.  Cardiovascular: Normal rate.   Pulmonary/Chest: Effort normal.  Abdominal: Soft.  Musculoskeletal: Normal range of motion.  Neurological: He is alert.  Skin: Skin is warm.    ED Course  Procedures (including critical care time) Labs Review Labs Reviewed - No data to display  Imaging Review Ct Head Wo Contrast  02/24/2016  CLINICAL DATA:  Fall with right forehead injury.  Initial encounter. EXAM: CT HEAD WITHOUT CONTRAST TECHNIQUE: Contiguous  axial images were obtained from the base of the skull through the vertex without intravenous contrast. COMPARISON:  05/04/2015 FINDINGS: Skull and Sinuses:Hematoma with laceration and soft tissue emphysema to the right forehead. Negative for calvarial fracture. Visualized orbits: Bilateral cataract resection. Brain: New left subdural collection that is isodense to parenchyma. No high density area to suggest acute hemorrhage. The collection measures up to 5 mm thickness and is most convincing on coronal reformats. No evidence of infarct, hydrocephalus, or brain swelling. Patchy chronic small vessel disease in the cerebral white matter. IMPRESSION: 1. Small left cerebral convexity subdural collection with density suggesting chronic hematoma. Maximal thickness is 5 mm and there is no mass effect. 2. Right forehead hematoma and laceration without fracture. Electronically Signed   By: Monte Fantasia M.D.   On: 02/24/2016 12:49   I have personally reviewed and evaluated these images and lab results as part of my medical decision-making.   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 Forehead hematoma and laceration was closed. Appears to be mechanical fall. Head CT done and shows likely chronic subdural. Does not appear to be acute. His had an old subdural to site and also has had falls  within the last few months. No recent fall besides today. Will discharge home.    Davonna Belling, MD 02/24/16 [phone]

## 2016-02-24 NOTE — ED Notes (Signed)
Pt here from home with witnessed fall. Pt denies LOC. Pt reports he lost his balance and fell backward. Pt has large hematoma with small lac above right eye. Pt denies anticoagulation.

## 2016-02-24 NOTE — ED Notes (Signed)
Pt is in stable condition upon d/c and is escorted from ED via wheelchair. 

## 2016-02-24 NOTE — Discharge Instructions (Signed)
Facial Laceration ° A facial laceration is a cut on the face. These injuries can be painful and cause bleeding. Lacerations usually heal quickly, but they need special care to reduce scarring. °DIAGNOSIS  °Your health care provider will take a medical history, ask for details about how the injury occurred, and examine the wound to determine how deep the cut is. °TREATMENT  °Some facial lacerations may not require closure. Others may not be able to be closed because of an increased risk of infection. The risk of infection and the chance for successful closure will depend on various factors, including the amount of time since the injury occurred. °The wound may be cleaned to help prevent infection. If closure is appropriate, pain medicines may be given if needed. Your health care provider will use stitches (sutures), wound glue (adhesive), or skin adhesive strips to repair the laceration. These tools bring the skin edges together to allow for faster healing and a better cosmetic outcome. If needed, you may also be given a tetanus shot. °HOME CARE INSTRUCTIONS °· Only take over-the-counter or prescription medicines as directed by your health care provider. °· Follow your health care provider's instructions for wound care. These instructions will vary depending on the technique used for closing the wound. °For Sutures: °· Keep the wound clean and dry.   °· If you were given a bandage (dressing), you should change it at least once a day. Also change the dressing if it becomes wet or dirty, or as directed by your health care provider.   °· Wash the wound with soap and water 2 times a day. Rinse the wound off with water to remove all soap. Pat the wound dry with a clean towel.   °· After cleaning, apply a thin layer of the antibiotic ointment recommended by your health care provider. This will help prevent infection and keep the dressing from sticking.   °· You may shower as usual after the first 24 hours. Do not soak the  wound in water until the sutures are removed.   °· Get your sutures removed as directed by your health care provider. With facial lacerations, sutures should usually be taken out after 4-5 days to avoid stitch marks.   °· Wait a few days after your sutures are removed before applying any makeup. °For Skin Adhesive Strips: °· Keep the wound clean and dry.   °· Do not get the skin adhesive strips wet. You may bathe carefully, using caution to keep the wound dry.   °· If the wound gets wet, pat it dry with a clean towel.   °· Skin adhesive strips will fall off on their own. You may trim the strips as the wound heals. Do not remove skin adhesive strips that are still stuck to the wound. They will fall off in time.   °For Wound Adhesive: °· You may briefly wet your wound in the shower or bath. Do not soak or scrub the wound. Do not swim. Avoid periods of heavy sweating until the skin adhesive has fallen off on its own. After showering or bathing, gently pat the wound dry with a clean towel.   °· Do not apply liquid medicine, cream medicine, ointment medicine, or makeup to your wound while the skin adhesive is in place. This may loosen the film before your wound is healed.   °· If a dressing is placed over the wound, be careful not to apply tape directly over the skin adhesive. This may cause the adhesive to be pulled off before the wound is healed.   °· Avoid   prolonged exposure to sunlight or tanning lamps while the skin adhesive is in place. °· The skin adhesive will usually remain in place for 5-10 days, then naturally fall off the skin. Do not pick at the adhesive film.   °After Healing: °Once the wound has healed, cover the wound with sunscreen during the day for 1 full year. This can help minimize scarring. Exposure to ultraviolet light in the first year will darken the scar. It can take 1-2 years for the scar to lose its redness and to heal completely.  °SEEK MEDICAL CARE IF: °· You have a fever. °SEEK IMMEDIATE  MEDICAL CARE IF: °· You have redness, pain, or swelling around the wound.   °· You see a yellowish-white fluid (pus) coming from the wound.   °  °This information is not intended to replace advice given to you by your health care provider. Make sure you discuss any questions you have with your health care provider. °  °Document Released: 12/14/2004 Document Revised: 11/27/2014 Document Reviewed: 06/19/2013 °Elsevier Interactive Patient Education ©2016 Elsevier Inc[employer] ° °Head Injury, Adult °You have a head injury. Headaches and throwing up (vomiting) are common after a head injury. It should be easy to wake up from sleeping. Sometimes you must stay in the hospital. Most problems happen within the first 24 hours. Side effects may occur up to 7-10 days after the injury.  °WHAT ARE THE TYPES OF HEAD INJURIES? °Head injuries can be as minor as a bump. Some head injuries can be more severe. More severe head injuries include: °· A jarring injury to the brain (concussion). °· A bruise of the brain (contusion). This mean there is bleeding in the brain that can cause swelling. °· A cracked skull (skull fracture). °· Bleeding in the brain that collects, clots, and forms a bump (hematoma). °WHEN SHOULD I GET HELP RIGHT AWAY?  °· You are confused or sleepy. °· You cannot be woken up. °· You feel sick to your stomach (nauseous) or keep throwing up (vomiting). °· Your dizziness or unsteadiness is getting worse. °· You have very bad, lasting headaches that are not helped by medicine. Take medicines only as told by your doctor. °· You cannot use your arms or legs like normal. °· You cannot walk. °· You notice changes in the black spots in the center of the colored part of your eye (pupil). °· You have clear or bloody fluid coming from your nose or ears. °· You have trouble seeing. °During the next 24 hours after the injury, you must stay with someone who can watch you. This person should get help right away (call 911 in the [place] if  you start to shake and are not able to control it (have seizures), you pass out, or you are unable to wake up. °HOW CAN I PREVENT A HEAD INJURY IN THE FUTURE? °· Wear seat belts. °· Wear a helmet while bike riding and playing sports like football. °· Stay away from dangerous activities around the house. °WHEN CAN I RETURN TO NORMAL ACTIVITIES AND ATHLETICS? °See your doctor before doing these activities. You should not do normal activities or play contact sports until 1 week after the following symptoms have stopped: °· Headache that does not go away. °· Dizziness. °· Poor attention. °· Confusion. °· Memory problems. °· Sickness to your stomach or throwing up. °· Tiredness. °· Fussiness. °· Bothered by bright lights or loud noises. °· Anxiousness or depression. °· Restless sleep. °MAKE SURE YOU:  °· Understand these instructions. °· Will   watch your condition. °· Will get help right away if you are not doing well or get worse. °  °This information is not intended to replace advice given to you by your health care provider. Make sure you discuss any questions you have with your health care provider. °  °Document Released: 10/19/2008 Document Revised: 11/27/2014 Document Reviewed: 07/14/2013 °Elsevier Interactive Patient Education ©2016 Elsevier Inc. ° °

## 2016-02-24 NOTE — ED Provider Notes (Signed)
This patient's care was done by Dr. Alvino Chapel. Please see his note for further. I helped with laceration repair only.  LACERATION REPAIR Performed by: Hanley Hays Authorized by: Hanley Hays Consent: Verbal consent obtained. Risks and benefits: risks, benefits and alternatives were discussed Consent given by: patient Patient identity confirmed: provided demographic data Prepped and Draped in normal sterile fashion Wound explored  Laceration Location: Right forehead   Laceration Length: 2 cm  No Foreign Bodies seen or palpated  Anesthesia: local infiltration  Local anesthetic: lidocaine 2% w/ epinephrine  Anesthetic total: 2 ml  Irrigation method: syringe Amount of cleaning: standard  Skin closure: 5-0 Proline   Number of sutures: 4   Technique: Simple interrupted.   Patient tolerance: Patient tolerated the procedure well with no immediate complications.  Patient had underlying hematoma to his laceration. We attempted to evacuate hematoma without success. Patient tolerated procedure well.    Waynetta Pean, PA-C 02/24/16 [place], MD 02/25/16 [phone]

## 2016-03-15 ENCOUNTER — Encounter: Payer: Self-pay | Admitting: Podiatry

## 2016-03-15 ENCOUNTER — Ambulatory Visit (INDEPENDENT_AMBULATORY_CARE_PROVIDER_SITE_OTHER): Payer: 59 | Admitting: Podiatry

## 2016-03-15 DIAGNOSIS — B351 Tinea unguium: Secondary | ICD-10-CM

## 2016-03-15 DIAGNOSIS — E11628 Type 2 diabetes mellitus with other skin complications: Secondary | ICD-10-CM | POA: Diagnosis not present

## 2016-03-15 DIAGNOSIS — M79676 Pain in unspecified toe(s): Secondary | ICD-10-CM

## 2016-03-15 NOTE — Patient Instructions (Signed)
Diabetes and Foot Care Diabetes may cause you to have problems because of poor blood supply (circulation) to your feet and legs. This may cause the skin on your feet to become thinner, break easier, and heal more slowly. Your skin may become dry, and the skin may peel and crack. You may also have nerve damage in your legs and feet causing decreased feeling in them. You may not notice minor injuries to your feet that could lead to infections or more serious problems. Taking care of your feet is one of the most important things you can do for yourself.  HOME CARE INSTRUCTIONS  Wear shoes at all times, even in the house. Do not go barefoot. Bare feet are easily injured.  Check your feet daily for blisters, cuts, and redness. If you cannot see the bottom of your feet, use a mirror or ask someone for help.  Wash your feet with warm water (do not use hot water) and mild soap. Then pat your feet and the areas between your toes until they are completely dry. Do not soak your feet as this can dry your skin.  Apply a moisturizing lotion or petroleum jelly (that does not contain alcohol and is unscented) to the skin on your feet and to dry, brittle toenails. Do not apply lotion between your toes.  Trim your toenails straight across. Do not dig under them or around the cuticle. File the edges of your nails with an emery board or nail file.  Do not cut corns or calluses or try to remove them with medicine.  Wear clean socks or stockings every day. Make sure they are not too tight. Do not wear knee-high stockings since they may decrease blood flow to your legs.  Wear shoes that fit properly and have enough cushioning. To break in new shoes, wear them for just a few hours a day. This prevents you from injuring your feet. Always look in your shoes before you put them on to be sure there are no objects inside.  Do not cross your legs. This may decrease the blood flow to your feet.  If you find a minor scrape,  cut, or break in the skin on your feet, keep it and the skin around it clean and dry. These areas may be cleansed with mild soap and water. Do not cleanse the area with peroxide, alcohol, or iodine.  When you remove an adhesive bandage, be sure not to damage the skin around it.  If you have a wound, look at it several times a day to make sure it is healing.  Do not use heating pads or hot water bottles. They may burn your skin. If you have lost feeling in your feet or legs, you may not know it is happening until it is too late.  Make sure your health care provider performs a complete foot exam at least annually or more often if you have foot problems. Report any cuts, sores, or bruises to your health care provider immediately. SEEK MEDICAL CARE IF:   You have an injury that is not healing.  You have cuts or breaks in the skin.  You have an ingrown nail.  You notice redness on your legs or feet.  You feel burning or tingling in your legs or feet.  You have pain or cramps in your legs and feet.  Your legs or feet are numb.  Your feet always feel cold. SEEK IMMEDIATE MEDICAL CARE IF:   There is increasing redness,   swelling, or pain in or around a wound.  There is a red line that goes up your leg.  Pus is coming from a wound.  You develop a fever or as directed by your health care provider.  You notice a bad smell coming from an ulcer or wound.   This information is not intended to replace advice given to you by your health care provider. Make sure you discuss any questions you have with your health care provider.   Document Released: 11/03/2000 Document Revised: 07/09/2013 Document Reviewed: 04/15/2013 Elsevier Interactive Patient Education 2016 Elsevier Inc.  

## 2016-03-15 NOTE — Progress Notes (Signed)
Patient ID: [name] [name], male   DOB: 1941-10-27, 75 y.o.   MRN: [id number]  Subjective: This patient presents complaining of painful toenails when walking wearing shoes and as well as at nighttime. He denies any infection around the nails, however describes a rather persistent discomfort which is temporary relief when the nails are debrided  Objective: Orientated 3 No peripheral edema bilaterally DP and PT pulses 0/4 bilaterally  Sensation to 10 g monofilament wire intact 5/5 bilaterally Vibratory sensation reactive bilaterally Ankle reflex equal reactive bilaterally  Dermatological: Dry atrophic skin bilaterally The toenails are brittle, discolored, incurvated 6-10 without any obvious erythema, edema, drainage  No deformities noted  Assessment: Decrease pedal pulses suggestive peripheral arterial disease 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

## 2016-04-19 ENCOUNTER — Ambulatory Visit: Payer: 59 | Attending: Family Medicine | Admitting: Physical Therapy

## 2016-04-19 ENCOUNTER — Encounter: Payer: Self-pay | Admitting: Physical Therapy

## 2016-04-19 DIAGNOSIS — R296 Repeated falls: Secondary | ICD-10-CM | POA: Diagnosis present

## 2016-04-19 DIAGNOSIS — R262 Difficulty in walking, not elsewhere classified: Secondary | ICD-10-CM | POA: Insufficient documentation

## 2016-04-19 DIAGNOSIS — M545 Low back pain, unspecified: Secondary | ICD-10-CM

## 2016-04-19 NOTE — Therapy (Signed)
[address] [place], [place], [address] Phone: [phone]   Fax:  [phone]  Physical Therapy Evaluation  Patient Details  Name: [name] [name] MRN: [id number] Date of Birth: 1941/04/28 Referring Provider: D. Smothers  Encounter Date: 04/19/2016      PT End of Session - 04/19/16 1558    Visit Number 1   Date for PT Re-Evaluation 06/18/16   PT Start Time [id number]   PT Stop Time 1619   PT Time Calculation (min) 49 min   Activity Tolerance Patient tolerated treatment well   Behavior During Therapy [facility] for tasks assessed/performed      Past Medical History  Diagnosis Date  . CKD III-IV     a. since transplant in 2001 with significant worsening since 06/2013.  Marland Kitchen Hypertension     a. Long h/o HTN, came off of all meds following transplant/wt loss-->meds resumed 06/2013, difficult to control since.  Marland Kitchen History of stroke   . Hypercalcemia     a. 06/2013 Hospitalized in [place].  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 1995 CABG x 4 in [place], [place];  b. 2001 Developed CHF;  c. 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 10/2000 Cardiac transplant @ [facility], [place];  e. Historically nl Bx and stress testing.  Last stress test ~ 5 yrs ago.  Marland Kitchen Hernia of abdominal wall   . Hyperlipidemia   . Orthostatic hypotension     a. Has tried support hose and compression stockings - prefers not to wear.  . Coronary artery disease   . Myocardial infarction ([place])     PRIOR TO HEART TRANSPLANT  . Shortness of breath     EXERTION  . Orthostatic dizziness   . Stroke ([place]) 06/2013    NO RESIDUAL - WAS CONFUSED AT TIME - THAT HAS RESOLVED  . Diabetes mellitus without complication (HCC)     FASTING CBG 90S  . Headache(784.0)   . Cancer ([place])     SKIN  . Anemia   . History of blood transfusion   . Dementia     mild    Past Surgical History  Procedure Laterality Date  . Cardiac surgery    . Heart transplant    . Cataract extraction     . Vasectomy    . Skin graft full thickness leg      cat scratch that did not heal  . Coronary artery bypass graft    . Eye surgery Bilateral     CATARACTS  . Bascilic vein transposition Left 07/06/2014    Procedure: [place];  Surgeon: Rosetta Posner, MD;  Location: [place];  Service: Vascular;  Laterality: Left;    There were no vitals filed for this visit.       Subjective Assessment - 04/19/16 1536    Subjective Patient and wife report that he has had about 6 falls in the past 6 months.  The most recent fall has caused some back and neck pain.  He reports that his dizziness is worse and he has stopped doing anything because of falling   Patient Stated Goals have less difficulty wallking   Currently in Pain? Yes   Pain Score 7    Pain Location Back   Pain Orientation Lower;Right   Pain Descriptors / Indicators Aching;Sore;Spasm;Tightness;Discomfort   Pain Type Acute pain   Pain Onset 1 to 4 weeks ago   Pain Frequency Constant   Aggravating Factors  falling   Pain Relieving Factors rest and pain meds   Effect of Pain on Daily Activities I just don't do anything anymore because of falling            [facility] PT Assessment - 04/19/16 0001    Assessment   Medical Diagnosis falls and back pain   Referring Provider D. Smothers   Onset Date/Surgical Date 04/12/16   Prior Therapy > 1 year ago for the same   Balance Screen   Has the patient fallen in the past 6 months Yes   How many times? 6   Has the patient had a decrease in activity level because of a fear of falling?  Yes   Is the patient reluctant to leave their home because of a fear of falling?  Yes   Home Environment   Additional Comments single level, 3 steps into the apartment   Prior Function   Level of Independence Independent with household mobility with device   Leisure hs exercised in the past    Posture/Postural Control   Posture Comments fwd head, rounded shoulders   ROM / Strength   AROM  / PROM / Strength AROM;Strength   AROM   Overall AROM Comments AROM of the knees 15-105 degrees flexion, lumbar ROM was limited as he did not want to do much becasue of pain and dizziness   Strength   Overall Strength Comments strength of the LE's 3+/5   Palpation   Palpation comment has a very significant hematoma of the right low back, purple and black, there is a large knot here the knot is 8cmx3cm, the hematoma is 15 inches by 13 inches   Ambulation/Gait   Gait Comments gait is with rollator, slow, stooped posture, unsteady with turns   Standardized Balance Assessment   Standardized Balance Assessment Timed Up and Go Test   Timed Up and Go Test   Normal TUG (seconds) 27  used a rollator walker                   [facility] Adult PT Treatment/Exercise - 04/19/16 0001    Modalities   Modalities Electrical Stimulation;Moist Heat   Moist Heat Therapy   Number Minutes Moist Heat 15 Minutes   Moist Heat Location Lumbar Spine   Electrical Stimulation   Electrical Stimulation Location right lumbar   Electrical Stimulation Action IFC   Electrical Stimulation Parameters sitting   Electrical Stimulation Goals Edema;Pain                  PT Short Term Goals - 04/19/16 1601    PT SHORT TERM GOAL #1   Title perform Berg balance test   Time 2   Period Weeks   Status New           PT Long Term Goals - 04/19/16 1601    PT LONG TERM GOAL #1   Title No falls over a 4 week period   Time 8   Period Weeks   Status New   PT LONG TERM GOAL #2   Title walk 200 feet with walker and supervision    Time 8   Period Weeks   Status New   PT LONG TERM GOAL #3   Title increase LE strength to 4/5   Time 8   Period Weeks   Status New   PT LONG TERM GOAL #4   Title decrease pain 50%   Time 8   Period Weeks   Status New  PT LONG TERM GOAL #5   Title decrease Tug test time to 20 seconds   Time 8   Period Weeks   Status New               Plan - 04/19/16 1559     Clinical Impression Statement Patient with multiple falls over the past few months, most recent one was last week with significant hematoma to the right low back area, his strength is very limited in the LE's, he reports that he has stopped going out due to fear of falls.  He c/o dizziness   Rehab Potential Good   PT Frequency 2x / week   PT Duration 8 weeks   PT Treatment/Interventions ADLs/Self Care Home Management;Cryotherapy;Electrical Stimulation;Moist Heat;Ultrasound;Gait training;Stair training;Balance training;Functional mobility training;Patient/family education;Therapeutic exercise;Therapeutic activities;Manual techniques   PT Next Visit Plan slowly start exercises, continue to assess balance   Consulted and Agree with Plan of Care Patient      Patient will benefit from skilled therapeutic intervention in order to improve the following deficits and impairments:  Abnormal gait, Cardiopulmonary status limiting activity, Decreased activity tolerance, Decreased balance, Decreased mobility, Decreased range of motion, Difficulty walking, Decreased strength, Increased edema, Increased muscle spasms, Impaired flexibility, Postural dysfunction, Improper body mechanics, Pain  Visit Diagnosis: Difficulty in walking, not elsewhere classified - Plan: PT plan of care cert/re-cert  Right-sided low back pain without sciatica - Plan: PT plan of care cert/re-cert  Repeated falls - Plan: PT plan of care cert/re-cert      G-Codes - [id number]    Functional Assessment Tool Used foto 63% limitiation   Functional Limitation Mobility: Walking and moving around   Mobility: Walking and Moving Around Current Status [phone]) At least 60 percent but less than 80 percent impaired, limited or restricted   Mobility: Walking and Moving Around Goal Status [phone]) At least 40 percent but less than 60 percent impaired, limited or restricted       Problem List Patient Active Problem List   Diagnosis Date  Noted  . Altered mental status   . Protein-calorie malnutrition, severe ([place]) 04/23/2015  . Acute encephalopathy 04/23/2015  . Leukocytosis 04/23/2015  . Anemia of chronic disease 04/23/2015  . Depression 04/23/2015  . UTI (lower urinary tract infection) 04/22/2015  . Barrett's esophagus 03/22/2015  . ESRD on dialysis ([place]) 10/12/2014  . DM (diabetes mellitus), type 2 with renal complications ([place]) [id number]  . Hypothyroidism 10/06/2014  . Hypertension 10/05/2014  . 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 Hypercholesterolemia 06/20/2014  . 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

## 2016-04-26 ENCOUNTER — Ambulatory Visit: Payer: Medicare HMO | Admitting: Physical Therapy

## 2016-04-26 ENCOUNTER — Ambulatory Visit: Payer: Medicare HMO | Attending: Family Medicine | Admitting: Physical Therapy

## 2016-04-26 ENCOUNTER — Encounter: Payer: Self-pay | Admitting: Physical Therapy

## 2016-04-26 DIAGNOSIS — M545 Low back pain, unspecified: Secondary | ICD-10-CM

## 2016-04-26 DIAGNOSIS — R262 Difficulty in walking, not elsewhere classified: Secondary | ICD-10-CM | POA: Insufficient documentation

## 2016-04-26 DIAGNOSIS — R296 Repeated falls: Secondary | ICD-10-CM | POA: Insufficient documentation

## 2016-04-26 NOTE — Therapy (Signed)
[address] [place], [place], [address] Phone: [phone]   Fax:  [phone]  Physical Therapy Treatment  Patient Details  Name: [name] [name] MRN: [id number] Date of Birth: [birth year] Referring Provider: D. Smothers  Encounter Date: 04/26/2016      PT End of Session - 04/26/16 1518    Visit Number 2   Date for PT Re-Evaluation 06/18/16   PT Start Time 1442   PT Stop Time 1518   PT Time Calculation (min) 36 min      Past Medical History  Diagnosis Date  . CKD III-IV     a. since transplant in 2001 with significant worsening since 06/2013.  Marland Kitchen Hypertension     a. Long h/o HTN, came off of all meds following transplant/wt loss-->meds resumed 06/2013, difficult to control since.  Marland Kitchen History of stroke   . Hypercalcemia     a. 06/2013 Hospitalized in [place].  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 1995 CABG x 4 in [place], [place];  b. 2001 Developed CHF;  c. 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 10/2000 Cardiac transplant @ [facility], [place];  e. Historically nl Bx and stress testing.  Last stress test ~ 5 yrs ago.  Marland Kitchen Hernia of abdominal wall   . Hyperlipidemia   . Orthostatic hypotension     a. Has tried support hose and compression stockings - prefers not to wear.  . Coronary artery disease   . Myocardial infarction ([place])     PRIOR TO HEART TRANSPLANT  . Shortness of breath     EXERTION  . Orthostatic dizziness   . Stroke ([place]) 06/2013    NO RESIDUAL - WAS CONFUSED AT TIME - THAT HAS RESOLVED  . Diabetes mellitus without complication (HCC)     FASTING CBG 90S  . Headache(784.0)   . Cancer ([place])     SKIN  . Anemia   . History of blood transfusion   . Dementia     mild    Past Surgical History  Procedure Laterality Date  . Cardiac surgery    . Heart transplant    . Cataract extraction    . Vasectomy    . Skin graft full thickness leg      cat scratch that did not heal  . Coronary artery bypass  graft    . Eye surgery Bilateral     CATARACTS  . Bascilic vein transposition Left 07/06/2014    Procedure: [place];  Surgeon: Rosetta Posner, MD;  Location: [place];  Service: Vascular;  Laterality: Left;    There were no vitals filed for this visit.      Subjective Assessment - 04/26/16 1445    Subjective Pt reports that he is not feeling well today.  "Im in good shape right now "   Currently in Pain? No/denies   Pain Score 0-No pain                         [facility] Adult PT Treatment/Exercise - 04/26/16 0001    Exercises   Exercises Lumbar;Knee/Hip   Lumbar Exercises: Standing   Other Standing Lumbar Exercises Standing march 2X8 HHA   Lumbar Exercises: Seated   Other Seated Lumbar Exercises Tband Rows blue 2x15   Lumbar Exercises: Supine   Bridge 10 reps;1 second   Straight Leg Raise 10 reps;1 second  x2   Other Supine Lumbar Exercises  Clamshells with manual resistance X10   Knee/Hip Exercises: Aerobic   Nustep L2 x5 min    Knee/Hip Exercises: Seated   Long Arc Quad 2 sets;10 reps   Long Arc Quad Weight 2 lbs.   Marching 2 sets;10 reps   Marching Weights 2 lbs.                  PT Short Term Goals - 04/19/16 1601    PT SHORT TERM GOAL #1   Title perform Berg balance test   Time 2   Period Weeks   Status New           PT Long Term Goals - 04/19/16 1601    PT LONG TERM GOAL #1   Title No falls over a 4 week period   Time 8   Period Weeks   Status New   PT LONG TERM GOAL #2   Title walk 200 feet with walker and supervision    Time 8   Period Weeks   Status New   PT LONG TERM GOAL #3   Title increase LE strength to 4/5   Time 8   Period Weeks   Status New   PT LONG TERM GOAL #4   Title decrease pain 50%   Time 8   Period Weeks   Status New   PT LONG TERM GOAL #5   Title decrease Tug test time to 20 seconds   Time 8   Period Weeks   Status New               Plan - 04/26/16 1518    Clinical  Impression Statement Pt 12 minutes late for today's treatment, Pt ~ to have very low energy but reports that he feel fine. Pt wife stated that pt always says that he feels fine even when he really does not. Pt with decrease sitting balance, pt sits with heavy posterior lean, falling back 3 times and un able to correct. Pt sits with head looking down with forward shoulder lean in order to maintain sitting balance. Pt is HOA and difficulty following cues. Pt gives good effort with the exercises.   Rehab Potential Good   PT Frequency 2x / week   PT Duration 8 weeks   PT Treatment/Interventions ADLs/Self Care Home Management;Cryotherapy;Electrical Stimulation;Moist Heat;Ultrasound;Gait training;Stair training;Balance training;Functional mobility training;Patient/family education;Therapeutic exercise;Therapeutic activities;Manual techniques   PT Next Visit Plan slowly start exercises, continue to assess balance      Patient will benefit from skilled therapeutic intervention in order to improve the following deficits and impairments:  Abnormal gait, Cardiopulmonary status limiting activity, Decreased activity tolerance, Decreased balance, Decreased mobility, Decreased range of motion, Difficulty walking, Decreased strength, Increased edema, Increased muscle spasms, Impaired flexibility, Postural dysfunction, Improper body mechanics, Pain  Visit Diagnosis: Difficulty in walking, not elsewhere classified  Right-sided low back pain without sciatica  Repeated falls     Problem List Patient Active Problem List   Diagnosis Date Noted  . Altered mental status   . Protein-calorie malnutrition, severe ([place]) 04/23/2015  . Acute encephalopathy 04/23/2015  . Leukocytosis 04/23/2015  . Anemia of chronic disease 04/23/2015  . Depression 04/23/2015  . UTI (lower urinary tract infection) 04/22/2015  . Barrett's esophagus 03/22/2015  . ESRD on dialysis ([place]) 10/12/2014  . DM (diabetes mellitus), type 2  with renal complications ([place]) [id number]  . Hypothyroidism 10/06/2014  . Hypertension 10/05/2014  . 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 Hypercholesterolemia 06/20/2014  . CMV (cytomegalovirus infection)  status positive ([place]) 06/20/2014    Scot Jun, PTA  04/26/2016, 3:23 PM  [address] [place], [place], [address] Phone: [phone]   Fax:  [phone]  Name: [name] [name] MRN: [id number] Date of Birth: [birth year]

## 2016-05-01 ENCOUNTER — Ambulatory Visit: Payer: Medicare HMO | Admitting: Physical Therapy

## 2016-05-01 ENCOUNTER — Encounter: Payer: Self-pay | Admitting: Physical Therapy

## 2016-05-01 DIAGNOSIS — R262 Difficulty in walking, not elsewhere classified: Secondary | ICD-10-CM

## 2016-05-01 DIAGNOSIS — R296 Repeated falls: Secondary | ICD-10-CM

## 2016-05-01 DIAGNOSIS — M545 Low back pain, unspecified: Secondary | ICD-10-CM

## 2016-05-01 NOTE — Therapy (Addendum)
[address] [place], [place], [address] Phone: [phone]   Fax:  [phone]  Physical Therapy Treatment  Patient Details  Name: [name] [name] MRN: [id number] Date of Birth: 09-14-1941 Referring Provider: D. Smothers  Encounter Date: 05/01/2016      PT End of Session - 05/01/16 1600    Visit Number 3   Date for PT Re-Evaluation 06/18/16   PT Start Time 1523   PT Stop Time 1558   PT Time Calculation (min) 35 min   Activity Tolerance Patient tolerated treatment well   Behavior During Therapy [facility] for tasks assessed/performed      Past Medical History  Diagnosis Date  . CKD III-IV     a. since transplant in 2001 with significant worsening since 06/2013.  Marland Kitchen Hypertension     a. Long h/o HTN, came off of all meds following transplant/wt loss-->meds resumed 06/2013, difficult to control since.  Marland Kitchen History of stroke   . Hypercalcemia     a. 06/2013 Hospitalized in [place].  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 1995 CABG x 4 in [place], [place];  b. 2001 Developed CHF;  c. 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 10/2000 Cardiac transplant @ [facility], [place];  e. Historically nl Bx and stress testing.  Last stress test ~ 5 yrs ago.  Marland Kitchen Hernia of abdominal wall   . Hyperlipidemia   . Orthostatic hypotension     a. Has tried support hose and compression stockings - prefers not to wear.  . Coronary artery disease   . Myocardial infarction ([place])     PRIOR TO HEART TRANSPLANT  . Shortness of breath     EXERTION  . Orthostatic dizziness   . Stroke ([place]) 06/2013    NO RESIDUAL - WAS CONFUSED AT TIME - THAT HAS RESOLVED  . Diabetes mellitus without complication (HCC)     FASTING CBG 90S  . Headache(784.0)   . Cancer ([place])     SKIN  . Anemia   . History of blood transfusion   . Dementia     mild    Past Surgical History  Procedure Laterality Date  . Cardiac surgery    . Heart transplant    . Cataract extraction     . Vasectomy    . Skin graft full thickness leg      cat scratch that did not heal  . Coronary artery bypass graft    . Eye surgery Bilateral     CATARACTS  . Bascilic vein transposition Left 07/06/2014    Procedure: [place];  Surgeon: Rosetta Posner, MD;  Location: [place];  Service: Vascular;  Laterality: Left;    There were no vitals filed for this visit.      Subjective Assessment - 05/01/16 1523    Subjective "I don't remember what happen, you have to ask the boss over there" Pt wife stated that he fell twice Friday but she does not remember what happen the first time. Pt wife stated the pt let go of his walker to get in bed and fell the second time. Pt does  reports feeling a little lightheaded   Currently in Pain? No/denies   Pain Score 0-No pain                         [facility] Adult PT Treatment/Exercise - 05/01/16 0001    High Level Balance  High Level Balance Comments Standign forward reach with red ball x10; Standing ball toss 2x10   Lumbar Exercises: Seated   Other Seated Lumbar Exercises Standing march x10 each; Standing toe clears 4in x10 with HHA, X10 without UE support   Knee/Hip Exercises: Aerobic   Nustep L4 x 7 min                   PT Short Term Goals - 04/19/16 1601    PT SHORT TERM GOAL #1   Title perform Berg balance test   Time 2   Period Weeks   Status New           PT Long Term Goals - 04/19/16 1601    PT LONG TERM GOAL #1   Title No falls over a 4 week period   Time 8   Period Weeks   Status New   PT LONG TERM GOAL #2   Title walk 200 feet with walker and supervision    Time 8   Period Weeks   Status New   PT LONG TERM GOAL #3   Title increase LE strength to 4/5   Time 8   Period Weeks   Status New   PT LONG TERM GOAL #4   Title decrease pain 50%   Time 8   Period Weeks   Status New   PT LONG TERM GOAL #5   Title decrease Tug test time to 20 seconds   Time 8   Period Weeks   Status  New               Plan - 05/01/16 1600    Clinical Impression Statement Pt 8 minutes late for today's treatment. Pt enters clinic reporting that he feels lighted headed. Pt able to complete all interventions this day but often times sit down before completing sets with standing interventions. Pt fatigues quickly with standing marches., reports no pain, mild instability with all standing activities.    Rehab Potential Good   PT Frequency 2x / week   PT Duration 8 weeks   PT Treatment/Interventions ADLs/Self Care Home Management;Cryotherapy;Electrical Stimulation;Moist Heat;Ultrasound;Gait training;Stair training;Balance training;Functional mobility training;Patient/family education;Therapeutic exercise;Therapeutic activities;Manual techniques   PT Next Visit Plan slowly start exercises, continue to assess balance      Patient will benefit from skilled therapeutic intervention in order to improve the following deficits and impairments:  Abnormal gait, Cardiopulmonary status limiting activity, Decreased activity tolerance, Decreased balance, Decreased mobility, Decreased range of motion, Difficulty walking, Decreased strength, Increased edema, Increased muscle spasms, Impaired flexibility, Postural dysfunction, Improper body mechanics, Pain  Visit Diagnosis: Difficulty in walking, not elsewhere classified  Right-sided low back pain without sciatica  Repeated falls     Problem List Patient Active Problem List   Diagnosis Date Noted  . Altered mental status   . Protein-calorie malnutrition, severe ([place]) 04/23/2015  . Acute encephalopathy 04/23/2015  . Leukocytosis 04/23/2015  . Anemia of chronic disease 04/23/2015  . Depression 04/23/2015  . UTI (lower urinary tract infection) 04/22/2015  . Barrett's esophagus 03/22/2015  . ESRD on dialysis ([place]) 10/12/2014  . DM (diabetes mellitus), type 2 with renal complications ([place]) 73/53/2992  . Hypothyroidism 10/06/2014  .  Hypertension 10/05/2014  . 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 Hypercholesterolemia 06/20/2014  . 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  Patient goals were not met. Patient is  being discharged due to a change in medical status.  ?????        Scot Jun, PTA  05/01/2016, 4:03 PM  [address] [place], [place], [address] Phone: [phone]   Fax:  [phone]  Name: [name] [name] MRN: [id number] Date of Birth: 03-Apr-1941

## 2016-05-02 ENCOUNTER — Emergency Department (HOSPITAL_COMMUNITY): Payer: Medicare HMO

## 2016-05-02 ENCOUNTER — Encounter (HOSPITAL_COMMUNITY): Payer: Self-pay | Admitting: Emergency Medicine

## 2016-05-02 ENCOUNTER — Inpatient Hospital Stay (HOSPITAL_COMMUNITY)
Admission: EM | Admit: 2016-05-02 | Discharge: 2016-05-10 | DRG: 025 | Disposition: A | Discharge status: Home Health | Payer: Medicare HMO | Attending: Neurosurgery | Admitting: Neurosurgery

## 2016-05-02 DIAGNOSIS — D62 Acute posthemorrhagic anemia: Secondary | ICD-10-CM | POA: Insufficient documentation

## 2016-05-02 DIAGNOSIS — Z992 Dependence on renal dialysis: Secondary | ICD-10-CM

## 2016-05-02 DIAGNOSIS — F329 Major depressive disorder, single episode, unspecified: Secondary | ICD-10-CM | POA: Diagnosis present

## 2016-05-02 DIAGNOSIS — D638 Anemia in other chronic diseases classified elsewhere: Secondary | ICD-10-CM | POA: Diagnosis not present

## 2016-05-02 DIAGNOSIS — Z9181 History of falling: Secondary | ICD-10-CM

## 2016-05-02 DIAGNOSIS — I429 Cardiomyopathy, unspecified: Secondary | ICD-10-CM | POA: Diagnosis present

## 2016-05-02 DIAGNOSIS — Z8673 Personal history of transient ischemic attack (TIA), and cerebral infarction without residual deficits: Secondary | ICD-10-CM | POA: Diagnosis not present

## 2016-05-02 DIAGNOSIS — D631 Anemia in chronic kidney disease: Secondary | ICD-10-CM | POA: Diagnosis present

## 2016-05-02 DIAGNOSIS — T82838A Hemorrhage of vascular prosthetic devices, implants and grafts, initial encounter: Secondary | ICD-10-CM | POA: Diagnosis not present

## 2016-05-02 DIAGNOSIS — I6201 Nontraumatic acute subdural hemorrhage: Secondary | ICD-10-CM | POA: Diagnosis not present

## 2016-05-02 DIAGNOSIS — E118 Type 2 diabetes mellitus with unspecified complications: Secondary | ICD-10-CM | POA: Diagnosis not present

## 2016-05-02 DIAGNOSIS — E8889 Other specified metabolic disorders: Secondary | ICD-10-CM | POA: Diagnosis present

## 2016-05-02 DIAGNOSIS — I120 Hypertensive chronic kidney disease with stage 5 chronic kidney disease or end stage renal disease: Secondary | ICD-10-CM | POA: Diagnosis present

## 2016-05-02 DIAGNOSIS — N186 End stage renal disease: Secondary | ICD-10-CM

## 2016-05-02 DIAGNOSIS — Z941 Heart transplant status: Secondary | ICD-10-CM | POA: Diagnosis not present

## 2016-05-02 DIAGNOSIS — E785 Hyperlipidemia, unspecified: Secondary | ICD-10-CM | POA: Diagnosis present

## 2016-05-02 DIAGNOSIS — I10 Essential (primary) hypertension: Secondary | ICD-10-CM | POA: Diagnosis not present

## 2016-05-02 DIAGNOSIS — N189 Chronic kidney disease, unspecified: Secondary | ICD-10-CM

## 2016-05-02 DIAGNOSIS — I252 Old myocardial infarction: Secondary | ICD-10-CM | POA: Diagnosis not present

## 2016-05-02 DIAGNOSIS — F0390 Unspecified dementia without behavioral disturbance: Secondary | ICD-10-CM | POA: Diagnosis present

## 2016-05-02 DIAGNOSIS — G9389 Other specified disorders of brain: Secondary | ICD-10-CM

## 2016-05-02 DIAGNOSIS — S065X9A Traumatic subdural hemorrhage with loss of consciousness of unspecified duration, initial encounter: Principal | ICD-10-CM | POA: Diagnosis present

## 2016-05-02 DIAGNOSIS — F0280 Dementia in other diseases classified elsewhere without behavioral disturbance: Secondary | ICD-10-CM | POA: Diagnosis not present

## 2016-05-02 DIAGNOSIS — Z79899 Other long term (current) drug therapy: Secondary | ICD-10-CM

## 2016-05-02 DIAGNOSIS — E1122 Type 2 diabetes mellitus with diabetic chronic kidney disease: Secondary | ICD-10-CM | POA: Diagnosis present

## 2016-05-02 DIAGNOSIS — W010XXA Fall on same level from slipping, tripping and stumbling without subsequent striking against object, initial encounter: Secondary | ICD-10-CM | POA: Diagnosis present

## 2016-05-02 DIAGNOSIS — S065XAA Traumatic subdural hemorrhage with loss of consciousness status unknown, initial encounter: Secondary | ICD-10-CM | POA: Diagnosis present

## 2016-05-02 DIAGNOSIS — I6200 Nontraumatic subdural hemorrhage, unspecified: Secondary | ICD-10-CM | POA: Diagnosis not present

## 2016-05-02 DIAGNOSIS — Z951 Presence of aortocoronary bypass graft: Secondary | ICD-10-CM

## 2016-05-02 DIAGNOSIS — Z888 Allergy status to other drugs, medicaments and biological substances status: Secondary | ICD-10-CM

## 2016-05-02 DIAGNOSIS — Z452 Encounter for adjustment and management of vascular access device: Secondary | ICD-10-CM

## 2016-05-02 DIAGNOSIS — E871 Hypo-osmolality and hyponatremia: Secondary | ICD-10-CM | POA: Diagnosis present

## 2016-05-02 DIAGNOSIS — I951 Orthostatic hypotension: Secondary | ICD-10-CM | POA: Diagnosis present

## 2016-05-02 DIAGNOSIS — E1129 Type 2 diabetes mellitus with other diabetic kidney complication: Secondary | ICD-10-CM | POA: Diagnosis present

## 2016-05-02 DIAGNOSIS — Z8249 Family history of ischemic heart disease and other diseases of the circulatory system: Secondary | ICD-10-CM

## 2016-05-02 DIAGNOSIS — I25811 Atherosclerosis of native coronary artery of transplanted heart without angina pectoris: Secondary | ICD-10-CM | POA: Diagnosis present

## 2016-05-02 DIAGNOSIS — I6203 Nontraumatic chronic subdural hemorrhage: Secondary | ICD-10-CM | POA: Diagnosis present

## 2016-05-02 DIAGNOSIS — Z85828 Personal history of other malignant neoplasm of skin: Secondary | ICD-10-CM

## 2016-05-02 DIAGNOSIS — E1121 Type 2 diabetes mellitus with diabetic nephropathy: Secondary | ICD-10-CM | POA: Diagnosis not present

## 2016-05-02 DIAGNOSIS — F32A Depression, unspecified: Secondary | ICD-10-CM | POA: Diagnosis present

## 2016-05-02 DIAGNOSIS — I6789 Other cerebrovascular disease: Secondary | ICD-10-CM | POA: Diagnosis not present

## 2016-05-02 LAB — BASIC METABOLIC PANEL
Anion gap: 13 (ref 5–15)
BUN: 41 mg/dL — ABNORMAL HIGH (ref 6–20)
CO2: 28 mmol/L (ref 22–32)
Calcium: 8.5 mg/dL — ABNORMAL LOW (ref 8.9–10.3)
Chloride: 95 mmol/L — ABNORMAL LOW (ref 101–111)
Creatinine, Ser: 7.95 mg/dL — ABNORMAL HIGH (ref 0.61–1.24)
GFR calc Af Amer: 7 mL/min — ABNORMAL LOW (ref >60)
GFR calc non Af Amer: 6 mL/min — ABNORMAL LOW (ref >60)
Glucose, Bld: 102 mg/dL — ABNORMAL HIGH (ref 65–99)
Potassium: 4.3 mmol/L (ref 3.5–5.1)
Sodium: 136 mmol/L (ref 135–145)

## 2016-05-02 LAB — TSH: TSH: 1.405 u[IU]/mL (ref 0.350–4.500)

## 2016-05-02 LAB — HEPATIC FUNCTION PANEL
ALK PHOS: 111 U/L (ref 38–126)
ALT: 12 U/L — AB (ref 17–63)
AST: 14 U/L — AB (ref 15–41)
Albumin: 3.3 g/dL — ABNORMAL LOW (ref 3.5–5.0)
BILIRUBIN DIRECT: 0.2 mg/dL (ref 0.1–0.5)
BILIRUBIN INDIRECT: 0.4 mg/dL (ref 0.3–0.9)
Total Bilirubin: 0.6 mg/dL (ref 0.3–1.2)
Total Protein: 6 g/dL — ABNORMAL LOW (ref 6.5–8.1)

## 2016-05-02 LAB — TROPONIN I: Troponin I: <0.03 ng/mL (ref <0.031)

## 2016-05-02 LAB — CBC
HCT: 34 % — ABNORMAL LOW (ref 39.0–52.0)
Hemoglobin: 10.5 g/dL — ABNORMAL LOW (ref 13.0–17.0)
MCH: 30.7 pg (ref 26.0–34.0)
MCHC: 30.9 g/dL (ref 30.0–36.0)
MCV: 99.4 fL (ref 78.0–100.0)
Platelets: 259 10*3/uL (ref 150–400)
RBC: 3.42 MIL/uL — ABNORMAL LOW (ref 4.22–5.81)
RDW: 15.6 % — ABNORMAL HIGH (ref 11.5–15.5)
WBC: 8.5 10*3/uL (ref 4.0–10.5)

## 2016-05-02 LAB — CBG MONITORING, ED: Glucose-Capillary: 116 mg/dL — ABNORMAL HIGH (ref 65–99)

## 2016-05-02 LAB — MAGNESIUM: Magnesium: 2 mg/dL (ref 1.7–2.4)

## 2016-05-02 MED ORDER — MYCOPHENOLATE MOFETIL 250 MG PO CAPS
500.0000 mg | ORAL_CAPSULE | Freq: Every morning | ORAL | Status: DC
  Administered 2016-05-03 – 2016-05-10 (×6): 500 mg via ORAL
  Filled 2016-05-02 (×7): qty 2

## 2016-05-02 MED ORDER — LEVALBUTEROL HCL 0.63 MG/3ML IN NEBU: 0.6300 mg | INHALATION_SOLUTION | Freq: Four times a day (QID) | RESPIRATORY_TRACT | Status: DC | PRN

## 2016-05-02 MED ORDER — HYDRALAZINE HCL 20 MG/ML IJ SOLN
10.0000 mg | INTRAMUSCULAR | Status: DC | PRN
  Administered 2016-05-02: 10 mg via INTRAVENOUS
  Filled 2016-05-02: qty 1

## 2016-05-02 MED ORDER — DONEPEZIL HCL 10 MG PO TABS
10.0000 mg | ORAL_TABLET | Freq: Every day | ORAL | Status: DC
  Administered 2016-05-02 – 2016-05-09 (×8): 10 mg via ORAL
  Filled 2016-05-02 (×6): qty 1
  Filled 2016-05-02: qty 2
  Filled 2016-05-02 (×2): qty 1

## 2016-05-02 MED ORDER — ONDANSETRON HCL 4 MG PO TABS: 4.0000 mg | ORAL_TABLET | Freq: Four times a day (QID) | ORAL | Status: DC | PRN

## 2016-05-02 MED ORDER — METOPROLOL TARTRATE 25 MG PO TABS
25.0000 mg | ORAL_TABLET | Freq: Two times a day (BID) | ORAL | Status: DC
  Administered 2016-05-02 – 2016-05-08 (×8): 25 mg via ORAL
  Filled 2016-05-02 (×10): qty 1

## 2016-05-02 MED ORDER — MYCOPHENOLATE MOFETIL 250 MG PO CAPS
250.0000 mg | ORAL_CAPSULE | Freq: Every day | ORAL | Status: DC
  Administered 2016-05-02 – 2016-05-09 (×8): 250 mg via ORAL
  Filled 2016-05-02 (×8): qty 1

## 2016-05-02 MED ORDER — ACETAMINOPHEN 650 MG RE SUPP: 650.0000 mg | Freq: Four times a day (QID) | RECTAL | Status: DC | PRN

## 2016-05-02 MED ORDER — HYDRALAZINE HCL 50 MG PO TABS
75.0000 mg | ORAL_TABLET | Freq: Three times a day (TID) | ORAL | Status: DC
  Administered 2016-05-02 – 2016-05-05 (×4): 75 mg via ORAL
  Filled 2016-05-02 (×5): qty 1
  Filled 2016-05-02: qty 3

## 2016-05-02 MED ORDER — SODIUM CHLORIDE 0.9 % IV SOLN
250.0000 mL | INTRAVENOUS | Status: DC | PRN
  Administered 2016-05-04 (×2): via INTRAVENOUS

## 2016-05-02 MED ORDER — LEVOTHYROXINE SODIUM 75 MCG PO TABS
75.0000 ug | ORAL_TABLET | Freq: Every day | ORAL | Status: DC
  Administered 2016-05-03 – 2016-05-10 (×4): 75 ug via ORAL
  Filled 2016-05-02 (×4): qty 1

## 2016-05-02 MED ORDER — ACETAMINOPHEN 325 MG PO TABS
650.0000 mg | ORAL_TABLET | Freq: Four times a day (QID) | ORAL | Status: DC | PRN
  Administered 2016-05-03 (×2): 650 mg via ORAL
  Filled 2016-05-02: qty 2

## 2016-05-02 MED ORDER — SODIUM CHLORIDE 0.9% FLUSH
3.0000 mL | Freq: Two times a day (BID) | INTRAVENOUS | Status: DC
  Administered 2016-05-03 – 2016-05-06 (×6): 3 mL via INTRAVENOUS
  Administered 2016-05-07: 23:00:00 via INTRAVENOUS
  Administered 2016-05-08: 3 mL via INTRAVENOUS

## 2016-05-02 MED ORDER — LABETALOL HCL 5 MG/ML IV SOLN
20.0000 mg | Freq: Once | INTRAVENOUS | Status: AC
  Administered 2016-05-02: 20 mg via INTRAVENOUS
  Filled 2016-05-02: qty 4

## 2016-05-02 MED ORDER — CYCLOSPORINE MODIFIED (NEORAL) 25 MG PO CAPS
50.0000 mg | ORAL_CAPSULE | Freq: Two times a day (BID) | ORAL | Status: DC
  Administered 2016-05-02 – 2016-05-10 (×14): 50 mg via ORAL
  Filled 2016-05-02 (×18): qty 2

## 2016-05-02 MED ORDER — SENNOSIDES-DOCUSATE SODIUM 8.6-50 MG PO TABS: 1.0000 | ORAL_TABLET | Freq: Every evening | ORAL | Status: DC | PRN

## 2016-05-02 MED ORDER — PRAVASTATIN SODIUM 20 MG PO TABS
10.0000 mg | ORAL_TABLET | Freq: Every day | ORAL | Status: DC
  Administered 2016-05-02 – 2016-05-09 (×8): 10 mg via ORAL
  Filled 2016-05-02 (×8): qty 1

## 2016-05-02 MED ORDER — SODIUM CHLORIDE 0.9% FLUSH
3.0000 mL | Freq: Two times a day (BID) | INTRAVENOUS | Status: DC
  Administered 2016-05-04: 3 mL via INTRAVENOUS
  Administered 2016-05-07: 10 mL via INTRAVENOUS
  Administered 2016-05-08 – 2016-05-10 (×4): 3 mL via INTRAVENOUS

## 2016-05-02 MED ORDER — HYDRALAZINE HCL 20 MG/ML IJ SOLN
10.0000 mg | Freq: Four times a day (QID) | INTRAMUSCULAR | Status: DC | PRN
Start: 2016-05-02 — End: 2016-05-04
  Administered 2016-05-03: 10 mg via INTRAVENOUS
  Filled 2016-05-02: qty 1

## 2016-05-02 MED ORDER — ONDANSETRON HCL 4 MG/2ML IJ SOLN
4.0000 mg | Freq: Four times a day (QID) | INTRAMUSCULAR | Status: DC | PRN
  Administered 2016-05-03: 4 mg via INTRAVENOUS

## 2016-05-02 MED ORDER — RENA-VITE PO TABS
1.0000 | ORAL_TABLET | Freq: Every morning | ORAL | Status: DC
  Administered 2016-05-03 – 2016-05-09 (×7): 1 via ORAL
  Filled 2016-05-02 (×7): qty 1

## 2016-05-02 MED ORDER — SODIUM CHLORIDE 0.9% FLUSH
3.0000 mL | INTRAVENOUS | Status: DC | PRN
Start: 2016-05-02 — End: 2016-05-08
  Administered 2016-05-02: 3 mL via INTRAVENOUS

## 2016-05-02 NOTE — Care Management Note (Signed)
Case Management Note  Patient Details  Name: Waylon Vandruff MRN: [id number] Date of Birth: 1940/12/25  Subjective/Objective:                  75 yo patient from home after a fall. Patient complains of generalized weakness x5 weeks, family told EMS patient has had multiple falls in that time. Receives dialysis T,TH,SAT/ Home with spouse.  Action/Plan: Follow for disposition needs. /Home health services.   Expected Discharge Date:  05/04/16               Expected Discharge Plan:  [place]  In-House Referral:  NA  Discharge planning Services  CM Consult  Post Acute Care Choice:  Home Health Choice offered to:  Spouse  DME Arranged:  N/A DME Agency:  NA  HH Arranged:  RN, PT, Nurse's Aide [place] Agency:  [place]  Status of Service:  Completed, signed off  Medicare Important Message Given:    Date Medicare IM Given:    Medicare IM give by:    Date Additional Medicare IM Given:    Additional Medicare Important Message give by:     If discussed at [place] of Stay Meetings, dates discussed:    Additional Comments: Holt Woolbright J. Clydene Laming, RN, BSN, [employer] [phone] Spoke with pt spouse at bedside regarding discharge planning for [facility]. Offered pt list of home health agencies to choose from.  Pt chose [facility] to render services. Danny Lawless of [facility] notified.  No DME needs identified at this time.  Fuller Mandril, RN 05/02/2016, 3:42 PM

## 2016-05-02 NOTE — ED Notes (Signed)
Per [facility] patient from home after a fall.  Patient complains of generalized weakness x5 weeks, family told EMS patient has had multiple falls in that time.  Receives dialysis T,TH,SAT, did not go to appointment today due to weakness, has not missed any other appointments. Patient alert and oriented at this time.

## 2016-05-02 NOTE — H&P (Signed)
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 Patient states that he has become progressively more weak over the last 6 weeks , has had 3 falls in the last week. 2 falls on Friday and one fall today. He states that he's had small subdural hematomas since April 2015. Patient states today he was using a rolling walker , lost his balance and fell. He denies any preceding focal symptoms, in the preceding week. Patient was recently seen at [facility] by his cardiologist and his hydralazine dose was increased. He also had a recent 2-D echo done on 04/18/16 was found to have an EF of 60-65% without any segmental wall motion abnormalities. He denies any history of A. fib.   Patient states he has been going to physical therapy for the last 5 weeks without improvement in his symptoms. Patient states he has issues with orthostatic hypotension and gets very dizzy upon standing which she states is typically why he falls down. He does have weakness in his left leg which is been ongoing for approximately one year. He states he often drags it when walking. His hydralazine was recently increased to 75 mg 3 times a day. Marland Kitchen He denies chest pain or shortness of breath. Patient also complains of feeling somewhat dizzy and lightheaded due to poor oral intake over the last couple of days but denies any fever chills,rigors.      Review of Systems: negative for the following  Constitutional: Denies fever, chills, diaphoresis, appetite change and fatigue.  HEENT: Denies photophobia, eye pain, redness, hearing loss, ear pain, congestion, sore throat, rhinorrhea, sneezing,  mouth sores, trouble swallowing, neck pain, neck stiffness and tinnitus.  Respiratory: Denies SOB, DOE, cough, chest tightness, and wheezing.  Cardiovascular: Denies chest pain, palpitations and leg swelling.  Gastrointestinal: Denies nausea, vomiting, abdominal pain, diarrhea, constipation, blood in stool and abdominal distention.  Genitourinary: Denies dysuria, urgency, frequency, hematuria, flank pain and difficulty urinating.  Musculoskeletal: Denies myalgias, back pain, joint swelling, arthralgias and gait problem.  Skin: Denies pallor, rash and wound.  Neurological: Positive for dizziness/falls, seizures, syncope, weakness, light-headedness, numbness and headaches.  Hematological: Denies adenopathy. Easy bruising, personal or family bleeding history  Psychiatric/Behavioral: Denies suicidal ideation, mood changes, confusion, nervousness, sleep disturbance and agitation       Past Medical History  Diagnosis Date  . CKD III-IV     a. since transplant in 2001 with significant worsening since 06/2013.  Marland Kitchen Hypertension     a. Long h/o HTN, came off of all meds following transplant/wt loss-->meds resumed 06/2013, difficult to control since.  Marland Kitchen History of stroke   . Hypercalcemia     a. 06/2013 Hospitalized in [place].  Marland Kitchen Heart transplanted ([place])     a. 1995 CABG x 4 in [place], [place];  b. 2001 Developed CHF;  c. 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 10/2000 Cardiac transplant @ [facility], [place];  e. Historically nl Bx and stress testing.  Last stress test ~ 5 yrs ago.  Marland Kitchen Hernia of abdominal wall   . Hyperlipidemia   . Orthostatic hypotension     a. Has tried support hose and compression stockings - prefers not  to wear.  . Coronary artery disease   . Myocardial infarction ([place])     PRIOR TO HEART TRANSPLANT  . Shortness of breath     EXERTION  . Orthostatic dizziness   . Stroke ([place]) 06/2013    NO RESIDUAL - WAS CONFUSED AT TIME - THAT HAS RESOLVED  . Diabetes mellitus without complication  (HCC)     FASTING CBG 90S  . Headache(784.0)   . Cancer ([place])     SKIN  . Anemia   . History of blood transfusion   . Dementia     mild     Past Surgical History  Procedure Laterality Date  . Cardiac surgery    . Heart transplant    . Cataract extraction    . Vasectomy    . Skin graft full thickness leg      cat scratch that did not heal  . Coronary artery bypass graft    . Eye surgery Bilateral     CATARACTS  . Bascilic vein transposition Left 07/06/2014    Procedure: [place];  Surgeon: Rosetta Posner, MD;  Location: [facility];  Service: Vascular;  Laterality: Left;      Social History:  reports that he has never smoked. He has never used smokeless tobacco. He reports that he does not drink alcohol or use illicit drugs.    Allergies  Allergen Reactions  . Amlodipine Swelling    Swelling of the lower body  Swelling lower body  . Norvasc [Amlodipine Besylate] Swelling and Other (See Comments)    Swelling of the lower body   . Ace Inhibitors     Dialysis  . Angiotensin Receptor Blockers     Dialysis    Family History  Problem Relation Age of Onset  . Heart attack Father     died @ 64  . Diabetes Father   . Heart attack Mother   . Diabetes Mother   . Kidney disease Maternal Aunt       Prior to Admission medications   Medication Sig Start Date End Date Taking? Authorizing Provider  acetaminophen (TYLENOL) 325 MG tablet Take 2 tablets (650 mg total) by mouth every 6 (six) hours as needed for mild pain (or Fever >/= 101). 10/14/14  Yes Delfina Redwood, MD  aspirin 81 MG chewable tablet Chew 81 mg by mouth at bedtime.   Yes Historical Provider, MD  cholecalciferol (VITAMIN D) 1000 UNITS tablet Take 1,000 Units by mouth daily.   Yes Historical Provider, MD  cycloSPORINE modified (NEORAL) 25 MG capsule Take 50 mg by mouth 2 (two) times daily.  08/18/14  Yes Lelon Perla, MD  diphenoxylate-atropine (LOMOTIL) 2.5-0.025 MG per tablet Take 1 tablet  by mouth 2 (two) times daily as needed for diarrhea or loose stools.  03/01/15  Yes Historical Provider, MD  donepezil (ARICEPT) 10 MG tablet Take 10 mg by mouth at bedtime.  03/01/15  Yes Historical Provider, MD  FLUoxetine (PROZAC) 20 MG capsule Take 20 mg by mouth daily with breakfast.   Yes Historical Provider, MD  fluticasone (FLONASE) 50 MCG/ACT nasal spray Place 2 sprays into both nostrils at bedtime as needed for allergies.  04/12/16  Yes Historical Provider, MD  hydrALAZINE (APRESOLINE) 25 MG tablet Take 25 mg by mouth 3 (three) times daily. Take with 50 mg tablet to = 75 mg three (3) times a day 04/27/16  Yes Historical Provider, MD  hydrALAZINE (APRESOLINE) 50 MG tablet Take 50 mg by mouth  3 (three) times daily. Take with 25 mg tablet to = 75 mg three (3) times a day 04/18/16  Yes Historical Provider, MD  lansoprazole (PREVACID) 30 MG capsule Take 30 mg by mouth 4 (four) times a week. Pt takes on Sunday, Monday, Wednesday, and Friday. Does "NOT" take on dialysis days 12/22/15  Yes Historical Provider, MD  levothyroxine (SYNTHROID, LEVOTHROID) 75 MCG tablet Take 75 mcg by mouth daily before breakfast.   Yes Historical Provider, MD  metoprolol tartrate (LOPRESSOR) 25 MG tablet Take 2 tablets (50 mg total) by mouth 2 (two) times daily. Patient taking differently: Take 25 mg by mouth 2 (two) times daily.  12/27/14  Yes Ripudeep Krystal Eaton, MD  multivitamin (RENA-VIT) TABS tablet Take 1 tablet by mouth every morning.  03/08/15  Yes Historical Provider, MD  mycophenolate (CELLCEPT) 250 MG capsule Take 250-500 mg by mouth 2 (two) times daily. 500 mg in the morning and 250 every evening   Yes Historical Provider, MD  pravastatin (PRAVACHOL) 10 MG tablet Take 10 mg by mouth at bedtime.    Yes Historical Provider, MD  Probiotic Product (PRO-BIOTIC BLEND) CAPS Take 1 capsule by mouth daily.   Yes Historical Provider, MD  vitamin B-12 (CYANOCOBALAMIN) 1000 MCG tablet Take 1,000 mcg by mouth daily with breakfast.   Yes  Historical Provider, MD     Physical Exam: Filed Vitals:   05/02/16 1730 05/02/16 1745 05/02/16 1800 05/02/16 1815  BP: 155/69 148/66 166/63 172/65  Pulse: 66 67 66 66  Temp:      TempSrc:      Resp:  16  16  SpO2: 98% 99% 98% 99%      Constitutional: NAD, calm, comfortable Filed Vitals:   05/02/16 1730 05/02/16 1745 05/02/16 1800 05/02/16 1815  BP: 155/69 148/66 166/63 172/65  Pulse: 66 67 66 66  Temp:      TempSrc:      Resp:  16  16  SpO2: 98% 99% 98% 99%   Eyes: PERRL, lids and conjunctivae normal ENMT: Mucous membranes are moist. Posterior pharynx clear of any exudate or lesions.Normal dentition.  Neck: normal, supple, no masses, no thyromegaly Respiratory: clear to auscultation bilaterally, no wheezing, no crackles. Normal respiratory effort. No accessory muscle use.  Cardiovascular: Regular rate and rhythm, no murmurs / rubs / gallops. No extremity edema. 2+ pedal pulses. No carotid bruits.  Abdomen: no tenderness, no masses palpated. No hepatosplenomegaly. Bowel sounds positive.  Musculoskeletal: no clubbing / cyanosis. No joint deformity upper and lower extremities. Good ROM, no contractures. Normal muscle tone.  Skin: no rashes, lesions, ulcers. No induration Neurologic: CN 2-12 grossly intact. Sensation intact, DTR normal. Strength 5/5 in all 4.  Psychiatric: Normal judgment and insight. Alert and oriented x 3. Normal mood.     Labs on Admission: I have personally reviewed following labs and imaging studies  CBC:  Recent Labs Lab 05/02/16 1436  WBC 8.5  HGB 10.5*  HCT 34.0*  MCV 99.4  PLT [id number]    Basic Metabolic Panel:  Recent Labs Lab 05/02/16 1436  NA 136  K 4.3  CL 95*  CO2 28  GLUCOSE 102*  BUN 41*  CREATININE 7.95*  CALCIUM 8.5*    GFR: CrCl cannot be calculated (Unknown ideal weight.).  Liver Function Tests: No results for input(s): AST, ALT, ALKPHOS, BILITOT, PROT, ALBUMIN in the last 168 hours. No results for input(s): LIPASE,  AMYLASE in the last 168 hours. No results for input(s): AMMONIA in the last 168  hours.  Coagulation Profile: No results for input(s): INR, PROTIME in the last 168 hours. No results for input(s): DDIMER in the last 72 hours.  Cardiac Enzymes: No results for input(s): CKTOTAL, CKMB, CKMBINDEX, TROPONINI in the last 168 hours.  BNP (last 3 results) No results for input(s): PROBNP in the last 8760 hours.  HbA1C: No results for input(s): HGBA1C in the last 72 hours. Lab Results  Component Value Date   HGBA1C 6.3* 06/21/2014     CBG:  Recent Labs Lab 05/02/16 1523  GLUCAP 116*    Lipid Profile: No results for input(s): CHOL, HDL, LDLCALC, TRIG, CHOLHDL, LDLDIRECT in the last 72 hours.  Thyroid Function Tests: No results for input(s): TSH, T4TOTAL, FREET4, T3FREE, THYROIDAB in the last 72 hours.  Anemia Panel: No results for input(s): VITAMINB12, FOLATE, FERRITIN, TIBC, IRON, RETICCTPCT in the last 72 hours.  Urine analysis:    Component Value Date/Time   COLORURINE RED* 04/22/2015 1952   APPEARANCEUR TURBID* 04/22/2015 1952   LABSPEC 1.016 04/22/2015 1952   PHURINE 7.0 04/22/2015 1952   GLUCOSEU NEGATIVE 04/22/2015 1952   HGBUR LARGE* 04/22/2015 [place] NEGATIVE 04/22/2015 [place] NEGATIVE 04/22/2015 1952   PROTEINUR >300* 04/22/2015 1952   UROBILINOGEN 1.0 04/22/2015 1952   NITRITE NEGATIVE 04/22/2015 1952   LEUKOCYTESUR LARGE* 04/22/2015 1952    Sepsis Labs: @LABRCNTIP (procalcitonin:4,lacticidven:4) )No results found for this or any previous visit (from the past 240 hour(s)).       Radiological Exams on Admission: Ct Head Wo Contrast  05/02/2016  CLINICAL DATA:  Fall today EXAM: CT HEAD WITHOUT CONTRAST TECHNIQUE: Contiguous axial images were obtained from the base of the skull through the vertex without intravenous contrast. COMPARISON:  CT head 02/24/2016 FINDINGS: Large subdural fluid collection on the right. This is predominantly  low density but has scattered areas of increased density compatible with acute and chronic subdural hematoma. This measures up to 27 mm in thickness. There is mass-effect on the right hemisphere without edema. 5 mm midline shift to the left. Generalized atrophy.  Negative for acute infarct. Negative for skull fracture. IMPRESSION: Large mixed density subdural hematoma on the right compatible with acute and chronic subdural hematoma. This was not present on the prior CT. 5 mm midline shift to the left. These results were called by telephone at the time of interpretation on 05/02/2016 at 3:45 Pm to [facility] , who verbally acknowledged these results. Electronically Signed   By: Franchot Gallo M.D.   On: 05/02/2016 16:06   Ct Head Wo Contrast  05/02/2016  CLINICAL DATA:  Fall today EXAM: CT HEAD WITHOUT CONTRAST TECHNIQUE: Contiguous axial images were obtained from the base of the skull through the vertex without intravenous contrast. COMPARISON:  CT head 02/24/2016 FINDINGS: Large subdural fluid collection on the right. This is predominantly low density but has scattered areas of increased density compatible with acute and chronic subdural hematoma. This measures up to 27 mm in thickness. There is mass-effect on the right hemisphere without edema. 5 mm midline shift to the left. Generalized atrophy.  Negative for acute infarct. Negative for skull fracture. IMPRESSION: Large mixed density subdural hematoma on the right compatible with acute and chronic subdural hematoma. This was not present on the prior CT. 5 mm midline shift to the left. These results were called by telephone at the time of interpretation on 05/02/2016 at 3:45 Pm to [facility] , who verbally acknowledged these results. Electronically Signed   By: Juanda Crumble  Carlis Abbott M.D.   On: 05/02/2016 16:06      EKG: Independently reviewed. *   Assessment/Plan Principal Problem:   Acute on chronic intracranial subdural hematoma (HCC) Dr. Trenton Gammon  consulted, patient will be admitted to step down, PCCM was called and they recommended stepdown admission, unless the patient needs to have surgery Repeat CT head tomorrow    Heart transplanted (HCC)-continue immunosuppressive therapy    Hypertension-continue prn hydralazine     DM (diabetes mellitus), type 2 with renal complications (HCC)-we'll start the patient on sliding scale insulin    ESRD on dialysis (HCC)-notify nephrology Dr. Lorrene Reid. They will see him tomorrow morning, no indication for urgent dialysis, electrolytes reviewed     Anemia of chronic disease-likely anemia secondary to chronic kidney disease, stable    Depression-stable   DVT prophylaxis: SCDs     Code Status Orders        Start     Ordered   05/02/16 1834  Full code   Continuous     05/02/16 1836   Family Communication: discussed with patient's Wife By the bedside   Disposition Plan:  Anticipate discharge in 2-3 days pending further workup and clinical progress  Consults called: Neurosurgery, nephrology  Admission status: Stepdown  Total time spent 55 minutes.Greater than 50% of this time was spent in counseling, explanation of diagnosis, planning of further management, and coordination of care  [facility] MD [facility] Pager [phone]  If 7PM-7AM, please contact night-coverage [email or web address] Password [email or web address]  05/02/2016, 6:46 PM

## 2016-05-02 NOTE — ED Notes (Addendum)
Discussed NPO status with admitting. Pt will be NPO until cleared by neurosurgery.

## 2016-05-02 NOTE — ED Notes (Signed)
Per Dr. Annette Stable, "tentative plan for surgery on Thursday, may have POs".

## 2016-05-02 NOTE — ED Notes (Signed)
Report to 3S attempted

## 2016-05-02 NOTE — ED Notes (Signed)
Wife called, updated

## 2016-05-02 NOTE — ED Provider Notes (Signed)
CSN: [id number]     Arrival date & time 05/02/16  1418 History   First MD Initiated Contact with Patient 05/02/16 1425     Chief Complaint  Patient presents with  . 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 Patient states that he has become progressively more weak over the last 6 weeks with multiple falls. Patient states today he was using a walker on his way to dialysis when he fell forward striking his head on the door.  Patient was unable to stand up so his wife called EMS. Patient states he has been going to physical therapy for the last 5 weeks without improvement in his symptoms. Patient states he has issues with orthostatic hypotension and gets very dizzy upon standing which she states is typically why he falls down. He does have weakness in his left leg which is been ongoing for approximately one year. He states he often drags it when walking. He was recently started on hydralazine 2 weeks ago to help with his blood pressure. Patient does not have home health care. He states he feels fine as long as he is sitting or lying down. He denies any headache, change in vision. He is not on anticoagulation. He denies chest pain or shortness of breath.  Past Medical History  Diagnosis Date  . CKD III-IV     a. since transplant in 2001 with significant worsening since 06/2013.  Marland Kitchen Hypertension     a. Long h/o HTN, came off of all meds following transplant/wt loss-->meds resumed 06/2013, difficult to control since.  Marland Kitchen History of stroke   . Hypercalcemia     a. 06/2013 Hospitalized in [place].  Marland Kitchen Heart transplanted ([place])     a. 1995 CABG x 4 in [place], [place];  b. 2001 Developed CHF;  c. 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 10/2000 Cardiac transplant @  [facility], [place];  e. Historically nl Bx and stress testing.  Last stress test ~ 5 yrs ago.  Marland Kitchen Hernia of abdominal wall   . Hyperlipidemia   . Orthostatic hypotension     a. Has tried support hose and compression stockings - prefers not to wear.  . Coronary artery disease   . Myocardial infarction ([place])     PRIOR TO HEART TRANSPLANT  . Shortness of breath     EXERTION  . Orthostatic dizziness   . Stroke ([place]) 06/2013    NO RESIDUAL - WAS CONFUSED AT TIME - THAT HAS RESOLVED  . Diabetes mellitus without complication (HCC)     FASTING CBG 90S  . Headache(784.0)   . Cancer ([place])     SKIN  . Anemia   . History of blood transfusion   . Dementia     mild   Past Surgical History  Procedure Laterality Date  . Cardiac surgery    . Heart transplant    . Cataract extraction    . Vasectomy    . Skin graft full thickness leg      cat scratch that did not heal  . Coronary artery bypass graft    . Eye surgery Bilateral     CATARACTS  . Bascilic vein transposition Left 07/06/2014    Procedure: [place];  Surgeon: Rosetta Posner, MD;  Location:  [facility] OR;  Service: Vascular;  Laterality: Left;   Family History  Problem Relation Age of Onset  . Heart attack Father     died @ 59  . Diabetes Father   . Heart attack Mother   . Diabetes Mother   . Kidney disease Maternal Aunt    Social History  Substance Use Topics  . Smoking status: Never Smoker   . Smokeless tobacco: Never Used  . Alcohol Use: No    Review of Systems  All other systems reviewed and are negative.     Allergies  Amlodipine and Norvasc  Home Medications   Prior to Admission medications   Medication Sig Start Date End Date Taking? Authorizing Provider  acetaminophen (TYLENOL) 325 MG tablet Take 2 tablets (650 mg total) by mouth every 6 (six) hours as needed for mild pain (or Fever >/= 101). 10/14/14   Delfina Redwood, MD  amoxicillin (AMOXIL) 500 MG tablet Take 1 tablet (500 mg  total) by mouth 2 (two) times daily. 04/27/15   Theodis Blaze, MD  aspirin 81 MG chewable tablet Chew 81 mg by mouth at bedtime.    Historical Provider, MD  cholecalciferol (VITAMIN D) 1000 UNITS tablet Take 1,000 Units by mouth daily.    Historical Provider, MD  cycloSPORINE modified (NEORAL) 25 MG capsule Take 75 mg by mouth 2 (two) times daily.  08/18/14   Lelon Perla, MD  diphenoxylate-atropine (LOMOTIL) 2.5-0.025 MG per tablet Take 1 tablet by mouth 2 (two) times daily as needed for diarrhea or loose stools.  03/01/15   Historical Provider, MD  donepezil (ARICEPT) 10 MG tablet Take 10 mg by mouth at bedtime.  03/01/15   Historical Provider, MD  FLUoxetine (PROZAC) 20 MG capsule Take 20 mg by mouth daily with breakfast.    Historical Provider, MD  HYDROcodone-acetaminophen (NORCO) 5-325 MG tablet Take 1 tablet by mouth every 6 (six) hours as needed for moderate pain. 10/14/15   [place], PA-C  lansoprazole (PREVACID) 15 MG capsule Take 30 mg by mouth 2 (two) times daily before a meal.     Historical Provider, MD  levothyroxine (SYNTHROID, LEVOTHROID) 75 MCG tablet Take 75 mcg by mouth daily before breakfast.    Historical Provider, MD  metoprolol tartrate (LOPRESSOR) 25 MG tablet Take 2 tablets (50 mg total) by mouth 2 (two) times daily. Patient taking differently: Take 25 mg by mouth 2 (two) times daily. mwf 12/27/14   Ripudeep Krystal Eaton, MD  multivitamin (RENA-VIT) TABS tablet Take 1 tablet by mouth every morning.  03/08/15   Historical Provider, MD  mycophenolate (CELLCEPT) 250 MG capsule Take 250-500 mg by mouth 2 (two) times daily. 500 mg in the morning and 250 every evening    Historical Provider, MD  pravastatin (PRAVACHOL) 10 MG tablet Take 10 mg by mouth at bedtime.     Historical Provider, MD  vitamin B-12 (CYANOCOBALAMIN) 1000 MCG tablet Take 1,000 mcg by mouth daily with breakfast.    Historical Provider, MD   BP 185/69 mmHg  Pulse 64  Temp(Src) 98.3 F (36.8 C) (Oral)   Resp 17  SpO2 100% Physical Exam  Constitutional: He is oriented to person, place, and time. He appears well-developed and well-nourished. No distress.  HENT:  Head: Normocephalic and atraumatic.  Mouth/Throat: No oropharyngeal exudate.  Eyes: Conjunctivae and EOM are normal. Pupils are equal, round, and reactive to light. Right eye exhibits no discharge. Left eye exhibits no discharge. No scleral icterus.  Cardiovascular: Normal  rate, regular rhythm, normal heart sounds and intact distal pulses.  Exam reveals no gallop and no friction rub.   No murmur heard. Pulmonary/Chest: Effort normal and breath sounds normal. No respiratory distress. He has no wheezes. He has no rales. He exhibits no tenderness.  Abdominal: Soft. He exhibits no distension. There is no tenderness. There is no guarding.  Musculoskeletal: Normal range of motion. He exhibits no edema.  Neurological: He is alert and oriented to person, place, and time. No cranial nerve deficit.  Strength 4/5 in LLE. Strength 5/5 in RUE, RLE, LUE. NO sensory deficits. Significant ataxia. No dysmetria.  Skin: Skin is warm and dry. No rash noted. He is not diaphoretic. No erythema. No pallor.  Psychiatric: He has a normal mood and affect. His behavior is normal.  Nursing note and vitals reviewed.   ED Course  Procedures (including critical care time) Labs Review Labs Reviewed  BASIC METABOLIC PANEL - Abnormal; Notable for the following:    Chloride 95 (*)    Glucose, Bld 102 (*)    BUN 41 (*)    Creatinine, Ser 7.95 (*)    Calcium 8.5 (*)    GFR calc non Af Amer 6 (*)    GFR calc Af Amer 7 (*)    All other components within normal limits  CBC - Abnormal; Notable for the following:    RBC 3.42 (*)    Hemoglobin 10.5 (*)    HCT 34.0 (*)    RDW 15.6 (*)    All other components within normal limits  CBG MONITORING, ED - Abnormal; Notable for the following:    Glucose-Capillary 116 (*)    All other components within normal limits   MAGNESIUM  HEPATIC FUNCTION PANEL  TSH  TROPONIN I  TROPONIN I  TROPONIN I  HEMOGLOBIN A1C  COMPREHENSIVE METABOLIC PANEL  CBC    Imaging Review Ct Head Wo Contrast  05/02/2016  CLINICAL DATA:  Fall today EXAM: CT HEAD WITHOUT CONTRAST TECHNIQUE: Contiguous axial images were obtained from the base of the skull through the vertex without intravenous contrast. COMPARISON:  CT head 02/24/2016 FINDINGS: Large subdural fluid collection on the right. This is predominantly low density but has scattered areas of increased density compatible with acute and chronic subdural hematoma. This measures up to 27 mm in thickness. There is mass-effect on the right hemisphere without edema. 5 mm midline shift to the left. Generalized atrophy.  Negative for acute infarct. Negative for skull fracture. IMPRESSION: Large mixed density subdural hematoma on the right compatible with acute and chronic subdural hematoma. This was not present on the prior CT. 5 mm midline shift to the left. These results were called by telephone at the time of interpretation on 05/02/2016 at 3:45 Pm to [facility] , who verbally acknowledged these results. Electronically Signed   By: Franchot Gallo M.D.   On: 05/02/2016 16:06   I have personally reviewed and evaluated these images and lab results as part of my medical decision-making.   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 Patient was on the way to dialysis when he stood up, became dizzy and fell forward striking his head on the door. He was unable to ambulate after the fall city came to the ED for further evaluation. Patient appears well in ED. He does have decreased strength in his left lower extremity but states this has been ongoing for several months now. He also has significant ataxia and is very dizzy upon standing. CT head reveals acute on chronic subdural hematoma with a midline shift. Spoke with Dr. Annette Stable with neurosurgery who states he will consult patient in ED. Spoke with Dr.Abrol with [facility] hospitalist who states she'll admit patient to her service. Patient's creatinine is elevated, he was due for dialysis today but was unable to attend as he came to the ED prior to his dialysis session. Spoke with Dr. Allyson Sabal who requests a consult to critical care as the patient does have a midline shift and she is concerned that he may need to go to the neuro ICU. Spoke with critical care medicine who states that as long as he is mentating appropriately he is okay to go to stepdown unit. However, if neurosurgery feels that they're going to perform surgical intervention on the patient and therefore ended up intubated and may be appropriate for him to go to critical care. Will await neurosurgery consult recommendations. For now, patient has a bed awaiting him on the stepdown unit. Patient was also hypertensive on arrival and given ongoing subdural he was given 20 mg of labetalol to lower his blood pressure. BP dropped from 208/72 to 152/71.   Patient was discussed with and seen by Dr. Wilson Singer who agrees with the treatment plan.     Dondra Spry [place], PA-C 05/02/16 1948  Virgel Manifold, MD 05/16/16 1153

## 2016-05-02 NOTE — Progress Notes (Signed)
ED CM met with patient at bedside. Patient confirms PCP Dr. Thurston Pounds PA-C and is active with [facility] Medicare. Patient lives at home with wife, and his son Brayson Livesey [phone] lives nearby. Patient uses a walker/cane and a w/c for assistance with ambulation.  He denies  having any HH services in the past.  CM will continue to follow for transitional care planning.

## 2016-05-02 NOTE — ED Notes (Signed)
meds requested from pharmacy, decaf coffee given per request, no changes, VSS/ improved.

## 2016-05-02 NOTE — Consult Note (Signed)
Reason for Consult:SDH  Referring Physician: ED  [name] [name] is an 75 y.o. male.  HPI: 75 year old male admitted for evaluation of frequent falling. Patient reports that an unknown number of weeks ago he had a very serious fall with associated right-sided headaches. These headaches have persisted. The patient notes that he is unsteady on his feet. He does feel some weakness in his left side. He has no sensory loss. He has no history of seizure. The patient is status post previous cardiac transplant. He has chronic renal disease on hemodialysis. He is on no anticoagulants.  Past Medical History  Diagnosis Date  . CKD III-IV     a. since transplant in 2001 with significant worsening since 06/2013.  Marland Kitchen Hypertension     a. Long h/o HTN, came off of all meds following transplant/wt loss-->meds resumed 06/2013, difficult to control since.  Marland Kitchen History of stroke   . Hypercalcemia     a. 06/2013 Hospitalized in [place].  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)     a. 1995 CABG x 4 in [place], [place];  b. 2001 Developed CHF;  c. 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 10/2000 Cardiac transplant @ [facility], [place];  e. Historically nl Bx and stress testing.  Last stress test ~ 5 yrs ago.  Marland Kitchen Hernia of abdominal wall   . Hyperlipidemia   . Orthostatic hypotension     a. Has tried support hose and compression stockings - prefers not to wear.  . Coronary artery disease   . Myocardial infarction ([place])     PRIOR TO HEART TRANSPLANT  . Shortness of breath     EXERTION  . Orthostatic dizziness   . Stroke ([place]) 06/2013    NO RESIDUAL - WAS CONFUSED AT TIME - THAT HAS RESOLVED  . Diabetes mellitus without complication (HCC)     FASTING CBG 90S  . Headache(784.0)   . Cancer ([place])     SKIN  . Anemia   . History of blood transfusion   . Dementia     mild    Past Surgical History  Procedure Laterality Date  . Cardiac surgery    . Heart transplant    . Cataract extraction    . Vasectomy    . Skin graft full thickness  leg      cat scratch that did not heal  . Coronary artery bypass graft    . Eye surgery Bilateral     CATARACTS  . Bascilic vein transposition Left 07/06/2014    Procedure: [place];  Surgeon: Rosetta Posner, MD;  Location: [facility] OR;  Service: Vascular;  Laterality: Left;    Family History  Problem Relation Age of Onset  . Heart attack Father     died @ 8  . Diabetes Father   . Heart attack Mother   . Diabetes Mother   . Kidney disease Maternal Aunt     Social History:  reports that he has never smoked. He has never used smokeless tobacco. He reports that he does not drink alcohol or use illicit drugs.  Allergies:  Allergies  Allergen Reactions  . Amlodipine Swelling    Swelling of the lower body  Swelling lower body  . Norvasc [Amlodipine Besylate] Swelling and Other (See Comments)    Swelling of the lower body   . Ace Inhibitors     Dialysis  . Angiotensin Receptor Blockers     Dialysis    Medications: I have reviewed the patient's current medications.  Results  for orders placed or performed during the hospital encounter of 05/02/16 (from the past 48 hour(s))  Basic metabolic panel     Status: Abnormal   Collection Time: 05/02/16  2:36 PM  Result Value Ref Range   Sodium 136 135 - 145 mmol/L   Potassium 4.3 3.5 - 5.1 mmol/L   Chloride 95 (L) 101 - 111 mmol/L   CO2 28 22 - 32 mmol/L   Glucose, Bld 102 (H) 65 - 99 mg/dL   BUN 41 (H) 6 - 20 mg/dL   Creatinine, Ser 7.95 (H) 0.61 - 1.24 mg/dL   Calcium 8.5 (L) 8.9 - 10.3 mg/dL   GFR calc non Af Amer 6 (L) >60 mL/min   GFR calc Af Amer 7 (L) >60 mL/min    Comment: (NOTE) The eGFR has been calculated using the CKD EPI equation. This calculation has not been validated in all clinical situations. eGFR's persistently <60 mL/min signify possible Chronic Kidney Disease.    Anion gap 13 5 - 15  CBC     Status: Abnormal   Collection Time: 05/02/16  2:36 PM  Result Value Ref Range   WBC 8.5 4.0 - 10.5  K/uL   RBC 3.42 (L) 4.22 - 5.81 MIL/uL   Hemoglobin 10.5 (L) 13.0 - 17.0 g/dL   HCT 34.0 (L) 39.0 - 52.0 %   MCV 99.4 78.0 - 100.0 fL   MCH 30.7 26.0 - 34.0 pg   MCHC 30.9 30.0 - 36.0 g/dL   RDW 15.6 (H) 11.5 - 15.5 %   Platelets 259 150 - 400 K/uL  CBG monitoring, ED     Status: Abnormal   Collection Time: 05/02/16  3:23 PM  Result Value Ref Range   Glucose-Capillary 116 (H) 65 - 99 mg/dL    Ct Head Wo Contrast  05/02/2016  CLINICAL DATA:  Fall today EXAM: CT HEAD WITHOUT CONTRAST TECHNIQUE: Contiguous axial images were obtained from the base of the skull through the vertex without intravenous contrast. COMPARISON:  CT head 02/24/2016 FINDINGS: Large subdural fluid collection on the right. This is predominantly low density but has scattered areas of increased density compatible with acute and chronic subdural hematoma. This measures up to 27 mm in thickness. There is mass-effect on the right hemisphere without edema. 5 mm midline shift to the left. Generalized atrophy.  Negative for acute infarct. Negative for skull fracture. IMPRESSION: Large mixed density subdural hematoma on the right compatible with acute and chronic subdural hematoma. This was not present on the prior CT. 5 mm midline shift to the left. These results were called by telephone at the time of interpretation on 05/02/2016 at 3:45 Pm to [facility] , who verbally acknowledged these results. Electronically Signed   By: Franchot Gallo M.D.   On: 05/02/2016 16:06    Pertinent items noted in HPI and remainder of comprehensive ROS otherwise negative. Blood pressure 172/65, pulse 67, temperature 98.3 F (36.8 C), temperature source Oral, resp. rate 16, SpO2 98 %. The patient is awake and alert. He is oriented and appropriate. His speech is fluent. His judgment and insight are intact. Examination his cranial nerve function finds his pupils to be equal at 4 mm bilaterally and briskly reactive. Gaze is conjugate. Extraocular  movements are full. Facial movement and sensation normal bilaterally. Tongue protrudes to midline. Palate elevates to midline. Examination of his motor function of his extremities reveals some mild left-sided weakness and incoordination. Sensory examination is nonfocal. Deep and reveals normal active. No evidence  of long track signs. Examination his head ears eyes and throat is unremarkable. Chest has postoperative change. Lungs are clear bilaterally. Abdomen soft. Extremities free from injury or deformity aside from his left arm vascular access graft.  Assessment/Plan: Chronic subdural hematoma with septations and some scattered acute hemorrhage. Mild mass effect. I discussed situation with the patient. I think his medical condition needs to be stabilized and he needs to receive dialysis as scheduled tomorrow. I have recommended that he undergo a right-sided craniotomy and evacuation of subdural hematoma probably on Thursday. I briefly discussed the risks and benefits and will return tomorrow to discuss things further with his family.  Tauni Sanks A 05/02/2016, 8:46 PM

## 2016-05-02 NOTE — ED Notes (Addendum)
Dr. Annette Stable into room, speaking with pt at [facility], no changes, alert, NAD, calm, interactive, resps e/u, participatory.

## 2016-05-02 NOTE — ED Notes (Addendum)
Pt alert, NAD, calm, interactive, resps e/u, no dyspnea noted, skin W&D, updated with "waiting for SD bed assignment, and pending NSURG consult", family at [facility] x2 leaving at this time, VSS. PERRL 21mm sluggish, h/o cataract surgery.

## 2016-05-02 NOTE — ED Notes (Signed)
[facility] hospitalist contacted. Updated. Pending orders. Pt given urinal, and snack/meal.

## 2016-05-03 ENCOUNTER — Ambulatory Visit: Payer: Medicare HMO | Admitting: Physical Therapy

## 2016-05-03 ENCOUNTER — Inpatient Hospital Stay (HOSPITAL_COMMUNITY): Payer: Medicare HMO

## 2016-05-03 ENCOUNTER — Other Ambulatory Visit (HOSPITAL_COMMUNITY): Payer: 59

## 2016-05-03 ENCOUNTER — Encounter (HOSPITAL_COMMUNITY): Payer: Self-pay

## 2016-05-03 LAB — TROPONIN I

## 2016-05-03 LAB — COMPREHENSIVE METABOLIC PANEL
ALT: 12 U/L — ABNORMAL LOW (ref 17–63)
ANION GAP: 16 — AB (ref 5–15)
AST: 13 U/L — ABNORMAL LOW (ref 15–41)
Albumin: 3.2 g/dL — ABNORMAL LOW (ref 3.5–5.0)
Alkaline Phosphatase: 125 U/L (ref 38–126)
BUN: 49 mg/dL — ABNORMAL HIGH (ref 6–20)
CHLORIDE: 93 mmol/L — AB (ref 101–111)
CO2: 28 mmol/L (ref 22–32)
Calcium: 8.4 mg/dL — ABNORMAL LOW (ref 8.9–10.3)
Creatinine, Ser: 8.98 mg/dL — ABNORMAL HIGH (ref 0.61–1.24)
GFR calc non Af Amer: 5 mL/min — ABNORMAL LOW (ref >60)
GFR, EST AFRICAN AMERICAN: 6 mL/min — AB (ref >60)
Glucose, Bld: 133 mg/dL — ABNORMAL HIGH (ref 65–99)
Potassium: 4.2 mmol/L (ref 3.5–5.1)
SODIUM: 137 mmol/L (ref 135–145)
Total Bilirubin: 0.6 mg/dL (ref 0.3–1.2)
Total Protein: 5.9 g/dL — ABNORMAL LOW (ref 6.5–8.1)

## 2016-05-03 LAB — CBC
HCT: 34.7 % — ABNORMAL LOW (ref 39.0–52.0)
HEMOGLOBIN: 10.7 g/dL — AB (ref 13.0–17.0)
MCH: 30.9 pg (ref 26.0–34.0)
MCHC: 30.8 g/dL (ref 30.0–36.0)
MCV: 100.3 fL — ABNORMAL HIGH (ref 78.0–100.0)
Platelets: 250 10*3/uL (ref 150–400)
RBC: 3.46 MIL/uL — AB (ref 4.22–5.81)
RDW: 16 % — ABNORMAL HIGH (ref 11.5–15.5)
WBC: 7.5 10*3/uL (ref 4.0–10.5)

## 2016-05-03 LAB — HEMOGLOBIN A1C
HEMOGLOBIN A1C: 5.5 % (ref 4.8–5.6)
MEAN PLASMA GLUCOSE: 111 mg/dL

## 2016-05-03 LAB — RENAL FUNCTION PANEL
Albumin: 3.2 g/dL — ABNORMAL LOW (ref 3.5–5.0)
Anion gap: 11 (ref 5–15)
BUN: 10 mg/dL (ref 6–20)
CO2: 29 mmol/L (ref 22–32)
Calcium: 8.7 mg/dL — ABNORMAL LOW (ref 8.9–10.3)
Chloride: 96 mmol/L — ABNORMAL LOW (ref 101–111)
Creatinine, Ser: 3.3 mg/dL — ABNORMAL HIGH (ref 0.61–1.24)
GFR calc Af Amer: 20 mL/min — ABNORMAL LOW (ref >60)
GFR calc non Af Amer: 17 mL/min — ABNORMAL LOW (ref >60)
Glucose, Bld: 116 mg/dL — ABNORMAL HIGH (ref 65–99)
Phosphorus: 2.5 mg/dL (ref 2.5–4.6)
Potassium: 3.7 mmol/L (ref 3.5–5.1)
Sodium: 136 mmol/L (ref 135–145)

## 2016-05-03 LAB — MRSA PCR SCREENING: MRSA by PCR: NEGATIVE

## 2016-05-03 MED ORDER — HEPARIN SODIUM (PORCINE) 1000 UNIT/ML DIALYSIS: 1000.0000 [IU] | INTRAMUSCULAR | Status: DC | PRN

## 2016-05-03 MED ORDER — MYCOPHENOLATE MOFETIL 250 MG PO CAPS: 250.0000 mg | ORAL_CAPSULE | Freq: Two times a day (BID) | ORAL | Status: DC

## 2016-05-03 MED ORDER — LIDOCAINE-PRILOCAINE 2.5-2.5 % EX CREA: 1.0000 "application " | TOPICAL_CREAM | CUTANEOUS | Status: DC | PRN

## 2016-05-03 MED ORDER — DONEPEZIL HCL 10 MG PO TABS: 10.0000 mg | ORAL_TABLET | Freq: Every day | ORAL | Status: DC

## 2016-05-03 MED ORDER — OXYCODONE HCL 5 MG PO TABS
5.0000 mg | ORAL_TABLET | Freq: Once | ORAL | Status: AC
  Administered 2016-05-03: 5 mg via ORAL
  Filled 2016-05-03: qty 1

## 2016-05-03 MED ORDER — ACETAMINOPHEN 325 MG PO TABS
ORAL_TABLET | ORAL | Status: AC
  Filled 2016-05-03: qty 2

## 2016-05-03 MED ORDER — CYCLOBENZAPRINE HCL 10 MG PO TABS
10.0000 mg | ORAL_TABLET | Freq: Once | ORAL | Status: AC
  Administered 2016-05-03: 10 mg via ORAL
  Filled 2016-05-03: qty 1

## 2016-05-03 MED ORDER — RENA-VITE PO TABS: 1.0000 | ORAL_TABLET | Freq: Every morning | ORAL | Status: DC

## 2016-05-03 MED ORDER — SODIUM CHLORIDE 0.9 % IV SOLN
62.5000 mg | Freq: Once | INTRAVENOUS | Status: AC
  Administered 2016-05-05: 62.5 mg via INTRAVENOUS
  Filled 2016-05-03 (×2): qty 5

## 2016-05-03 MED ORDER — ZOLPIDEM TARTRATE 5 MG PO TABS
5.0000 mg | ORAL_TABLET | Freq: Once | ORAL | Status: AC
  Administered 2016-05-04: 5 mg via ORAL
  Filled 2016-05-03: qty 1

## 2016-05-03 MED ORDER — SODIUM CHLORIDE 0.9 % IV SOLN: 100.0000 mL | INTRAVENOUS | Status: DC | PRN

## 2016-05-03 MED ORDER — ONDANSETRON HCL 4 MG/2ML IJ SOLN
INTRAMUSCULAR | Status: AC
  Filled 2016-05-03: qty 2

## 2016-05-03 MED ORDER — METOPROLOL TARTRATE 25 MG PO TABS: 25.0000 mg | ORAL_TABLET | Freq: Two times a day (BID) | ORAL | Status: DC

## 2016-05-03 MED ORDER — LEVOTHYROXINE SODIUM 75 MCG PO TABS: 75.0000 ug | ORAL_TABLET | Freq: Every day | ORAL | Status: DC

## 2016-05-03 MED ORDER — DOXERCALCIFEROL 4 MCG/2ML IV SOLN
2.0000 ug | INTRAVENOUS | Status: DC
  Administered 2016-05-05 – 2016-05-09 (×3): 2 ug via INTRAVENOUS
  Filled 2016-05-03 (×2): qty 2

## 2016-05-03 MED ORDER — CYCLOSPORINE MODIFIED (NEORAL) 25 MG PO CAPS: 50.0000 mg | ORAL_CAPSULE | Freq: Two times a day (BID) | ORAL | Status: DC

## 2016-05-03 MED ORDER — HYDRALAZINE HCL 25 MG PO TABS: 25.0000 mg | ORAL_TABLET | Freq: Three times a day (TID) | ORAL | Status: DC

## 2016-05-03 MED ORDER — PANTOPRAZOLE SODIUM 40 MG PO TBEC
40.0000 mg | DELAYED_RELEASE_TABLET | Freq: Every day | ORAL | Status: DC
  Administered 2016-05-03 – 2016-05-10 (×6): 40 mg via ORAL
  Filled 2016-05-03 (×6): qty 1

## 2016-05-03 MED ORDER — LIDOCAINE HCL (PF) 1 % IJ SOLN: 5.0000 mL | INTRAMUSCULAR | Status: DC | PRN

## 2016-05-03 MED ORDER — ALTEPLASE 2 MG IJ SOLR: 2.0000 mg | Freq: Once | INTRAMUSCULAR | Status: DC | PRN

## 2016-05-03 MED ORDER — PENTAFLUOROPROP-TETRAFLUOROETH EX AERO: 1.0000 "application " | INHALATION_SPRAY | CUTANEOUS | Status: DC | PRN

## 2016-05-03 MED ORDER — FLUTICASONE PROPIONATE 50 MCG/ACT NA SUSP: 2.0000 | Freq: Every evening | NASAL | Status: DC | PRN

## 2016-05-03 MED ORDER — CALCIUM ACETATE (PHOS BINDER) 667 MG PO CAPS
667.0000 mg | ORAL_CAPSULE | Freq: Three times a day (TID) | ORAL | Status: DC
  Administered 2016-05-03 – 2016-05-05 (×2): 667 mg via ORAL
  Filled 2016-05-03 (×3): qty 1

## 2016-05-03 MED ORDER — PRAVASTATIN SODIUM 10 MG PO TABS: 10.0000 mg | ORAL_TABLET | Freq: Every day | ORAL | Status: DC

## 2016-05-03 NOTE — Progress Notes (Addendum)
Patient's confusion increasing, states he needs to go to bed.  Requested to call wife, which RN assisted with.  RN spoke with wife who states he gets this way at night and his better after he wakes up.  She states that she gives him 10mg  Flexeril at night and this helps him to sleep.  RN requested this from provider and orders to administer were obtained.  Pt seems to be resting comfortably now.   05/04/16 0019 Pt continued to have trouble sleeping.  5mg  Ambien ordered and administered. Pt resting.

## 2016-05-03 NOTE — Anesthesia Preprocedure Evaluation (Addendum)
Anesthesia Evaluation  Patient identified by MRN, date of birth, ID band Patient awake    Reviewed: Allergy & Precautions, H&P , Patient's Chart, lab work & pertinent test results, reviewed documented beta blocker date and time   Airway Mallampati: II  TM Distance: >3 FB Neck ROM: full    Dental no notable dental hx.    Pulmonary    Pulmonary exam normal breath sounds clear to auscultation       Cardiovascular hypertension, On Medications  Rhythm:regular Rate:Normal     Neuro/Psych    GI/Hepatic   Endo/Other  diabetes  Renal/GU      Musculoskeletal   Abdominal   Peds  Hematology   Anesthesia Other Findings CKC.... Dialyzed yesterday HTN Heart transplanted  @ [facility] Historically nl Bx and stress testing. Last stress test ~ 5 yrs ago. Spoke with wife ( cardiac RN ) she says his last EF was unchanged per echo 3 weeks ago Stroke  06/2013 NO RESIDUAL ........WAS CONFUSED  HAS RESOLVED  Diabetes mellitus   Dementia  mild       Reproductive/Obstetrics                           Anesthesia Physical Anesthesia Plan  ASA: III  Anesthesia Plan: General   Post-op Pain Management:    Induction: Intravenous  Airway Management Planned: Oral ETT  Additional Equipment: Arterial line  Intra-op Plan:   Post-operative Plan: Extubation in OR  Informed Consent: I have reviewed the patients History and Physical, chart, labs and discussed the procedure including the risks, benefits and alternatives for the proposed anesthesia with the patient or authorized representative who has indicated his/her understanding and acceptance.   Dental Advisory Given and Dental advisory given  Plan Discussed with: CRNA and Surgeon  Anesthesia Plan Comments: (  Discussed general anesthesia, including possible nausea, instrumentation of airway, sore throat,pulmonary aspiration, etc. I asked if the  were any outstanding questions, or  concerns before we proceeded. )        Anesthesia Quick Evaluation

## 2016-05-03 NOTE — Consult Note (Signed)
[facility] Renal Consultation Note    Indication for Consultation:  Management of ESRD/hemodialysis; anemia, hypertension/volume and secondary hyperparathyroidism PCP: ANDY,CAMILLE L, MD   HPI: [name] [name] is a 75 y.o. male with ESRD on hemodialysis TTS at [facility]. 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 Patient presented to [facility] with C/O progressive weakness, falls at home. Patient has reported episodes of orthostatic hypotension which apparently precedes falls. Weakness in L Leg. He has been having OP PT for past 5 weeks without improvement of symptoms. Patient reports "I have a headache all the time". CT scan 05/02/16 showed Large mixed density subdural hematoma on the right compatible with acute and chronic subdural hematoma with 5 mm shift to left. Neurosurgery (Dr. Annette Stable) has seen patient and R Craniotomy with evacuation of subdural hematoma has been planned for Thursday 05/04/16.   Currently patient is alert, recognizes me-extremely [facility] with bilateral hearing aides which are malfunctioning-therefore interview with patient is brief. He denies chest pain, SOB, fever, chills. NVD abdominal pain. + C/O headache, reports dizziness, recent falls. Missed HD 05/02/16 D/T issues with weakness-presentation in ED.Marland Kitchen   Past Medical History  Diagnosis Date  . CKD III-IV     a. since transplant in 2001 with significant worsening since 06/2013.  Marland Kitchen Hypertension     a. Long h/o HTN, came off of all meds following transplant/wt loss-->meds resumed 06/2013, difficult to control since.  Marland Kitchen History of stroke   . Hypercalcemia     a. 06/2013 Hospitalized in [place].  Marland Kitchen Heart transplanted ([place])     a. 1995 CABG x 4 in [place], [place];  b. 2001 Developed CHF;  c. 08/2000 LVAD  placed;  d. 10/2000 Cardiac transplant @ [facility], [place];  e. Historically nl Bx and stress testing.  Last stress test ~ 5 yrs ago.  Marland Kitchen Hernia of abdominal wall   . Hyperlipidemia   . Orthostatic hypotension     a. Has tried support hose and compression stockings - prefers not to wear.  . Coronary artery disease   . Myocardial infarction ([place])     PRIOR TO HEART TRANSPLANT  . Shortness of breath     EXERTION  . Orthostatic dizziness   . Stroke ([place]) 06/2013    NO RESIDUAL - WAS CONFUSED AT TIME - THAT HAS RESOLVED  . Diabetes mellitus without complication (HCC)     FASTING CBG 90S  . Headache(784.0)   . Cancer ([place])     SKIN  . Anemia   . History of blood transfusion   . Dementia     mild   Past Surgical History  Procedure Laterality Date  . Cardiac surgery    . Heart transplant    . Cataract extraction    . Vasectomy    . Skin graft full thickness leg      cat scratch that did not heal  . Coronary artery bypass graft    . Eye surgery Bilateral     CATARACTS  . Bascilic vein transposition Left 07/06/2014    Procedure: Thor;  Surgeon: Rosetta Posner, MD;  Location: [facility] OR;  Service: Vascular;  Laterality: Left;   Family History  Problem Relation Age of Onset  . Heart attack Father     died @ 64  . Diabetes Father   .  Heart attack Mother   . Diabetes Mother   . Kidney disease Maternal Aunt    Social History:  reports that he has never smoked. He has never used smokeless tobacco. He reports that he does not drink alcohol or use illicit drugs. Allergies  Allergen Reactions  . Amlodipine Swelling    Swelling of the lower body  Swelling lower body  . Norvasc [Amlodipine Besylate] Swelling and Other (See Comments)    Swelling of the lower body   . Ace Inhibitors     Dialysis  . Angiotensin Receptor Blockers     Dialysis   Prior to Admission medications   Medication Sig Start Date End Date Taking? Authorizing Provider  acetaminophen  (TYLENOL) 325 MG tablet Take 2 tablets (650 mg total) by mouth every 6 (six) hours as needed for mild pain (or Fever >/= 101). 10/14/14  Yes Delfina Redwood, MD  aspirin 81 MG chewable tablet Chew 81 mg by mouth at bedtime.   Yes Historical Provider, MD  cholecalciferol (VITAMIN D) 1000 UNITS tablet Take 1,000 Units by mouth daily.   Yes Historical Provider, MD  cycloSPORINE modified (NEORAL) 25 MG capsule Take 50 mg by mouth 2 (two) times daily.  08/18/14  Yes Lelon Perla, MD  diphenoxylate-atropine (LOMOTIL) 2.5-0.025 MG per tablet Take 1 tablet by mouth 2 (two) times daily as needed for diarrhea or loose stools.  03/01/15  Yes Historical Provider, MD  donepezil (ARICEPT) 10 MG tablet Take 10 mg by mouth at bedtime.  03/01/15  Yes Historical Provider, MD  FLUoxetine (PROZAC) 20 MG capsule Take 20 mg by mouth daily with breakfast.   Yes Historical Provider, MD  fluticasone (FLONASE) 50 MCG/ACT nasal spray Place 2 sprays into both nostrils at bedtime as needed for allergies.  04/12/16  Yes Historical Provider, MD  hydrALAZINE (APRESOLINE) 25 MG tablet Take 25 mg by mouth 3 (three) times daily. Take with 50 mg tablet to = 75 mg three (3) times a day 04/27/16  Yes Historical Provider, MD  hydrALAZINE (APRESOLINE) 50 MG tablet Take 50 mg by mouth 3 (three) times daily. Take with 25 mg tablet to = 75 mg three (3) times a day 04/18/16  Yes Historical Provider, MD  lansoprazole (PREVACID) 30 MG capsule Take 30 mg by mouth 4 (four) times a week. Pt takes on Sunday, Monday, Wednesday, and Friday. Does "NOT" take on dialysis days 12/22/15  Yes Historical Provider, MD  levothyroxine (SYNTHROID, LEVOTHROID) 75 MCG tablet Take 75 mcg by mouth daily before breakfast.   Yes Historical Provider, MD  metoprolol tartrate (LOPRESSOR) 25 MG tablet Take 2 tablets (50 mg total) by mouth 2 (two) times daily. Patient taking differently: Take 25 mg by mouth 2 (two) times daily.  12/27/14  Yes Ripudeep Krystal Eaton, MD  multivitamin  (RENA-VIT) TABS tablet Take 1 tablet by mouth every morning.  03/08/15  Yes Historical Provider, MD  mycophenolate (CELLCEPT) 250 MG capsule Take 250-500 mg by mouth 2 (two) times daily. 500 mg in the morning and 250 every evening   Yes Historical Provider, MD  pravastatin (PRAVACHOL) 10 MG tablet Take 10 mg by mouth at bedtime.    Yes Historical Provider, MD  Probiotic Product (PRO-BIOTIC BLEND) CAPS Take 1 capsule by mouth daily.   Yes Historical Provider, MD  vitamin B-12 (CYANOCOBALAMIN) 1000 MCG tablet Take 1,000 mcg by mouth daily with breakfast.   Yes Historical Provider, MD   Current Facility-Administered Medications  Medication Dose Route Frequency Provider Last  Rate Last Dose  . 0.9 %  sodium chloride infusion  250 mL Intravenous PRN Reyne Dumas, MD      . acetaminophen (TYLENOL) tablet 650 mg  650 mg Oral Q6H PRN Reyne Dumas, MD       Or  . acetaminophen (TYLENOL) suppository 650 mg  650 mg Rectal Q6H PRN Reyne Dumas, MD      . cycloSPORINE modified (NEORAL) capsule 50 mg  50 mg Oral BID Rhetta Mura Schorr, NP   50 mg at 05/02/16 2246  . donepezil (ARICEPT) tablet 10 mg  10 mg Oral QHS Rhetta Mura Schorr, NP   10 mg at 05/02/16 2205  . fluticasone (FLONASE) 50 MCG/ACT nasal spray 2 spray  2 spray Each Nare QHS PRN Reyne Dumas, MD      . hydrALAZINE (APRESOLINE) injection 10 mg  10 mg Intravenous Q6H PRN Jeryl Columbia, NP   10 mg at 05/03/16 0753  . hydrALAZINE (APRESOLINE) tablet 75 mg  75 mg Oral Q8H Rhetta Mura Schorr, NP   75 mg at 05/03/16 0554  . levalbuterol (XOPENEX) nebulizer solution 0.63 mg  0.63 mg Nebulization Q6H PRN Reyne Dumas, MD      . levothyroxine (SYNTHROID, LEVOTHROID) tablet 75 mcg  75 mcg Oral QAC breakfast Rhetta Mura Schorr, NP   75 mcg at 05/03/16 0753  . metoprolol tartrate (LOPRESSOR) tablet 25 mg  25 mg Oral BID Jeryl Columbia, NP   25 mg at 05/02/16 2204  . multivitamin (RENA-VIT) tablet 1 tablet  1 tablet Oral q morning - 10a Rhetta Mura  Schorr, NP      . mycophenolate (CELLCEPT) capsule 250 mg  250 mg Oral QHS Rhetta Mura Schorr, NP   250 mg at 05/02/16 2247  . mycophenolate (CELLCEPT) capsule 500 mg  500 mg Oral q morning - 10a Jeryl Columbia, NP      . ondansetron (ZOFRAN) tablet 4 mg  4 mg Oral Q6H PRN Reyne Dumas, MD       Or  . ondansetron (ZOFRAN) injection 4 mg  4 mg Intravenous Q6H PRN Reyne Dumas, MD      . pantoprazole (PROTONIX) EC tablet 40 mg  40 mg Oral Daily Reyne Dumas, MD      . pravastatin (PRAVACHOL) tablet 10 mg  10 mg Oral QHS Rhetta Mura Schorr, NP   10 mg at 05/02/16 2247  . senna-docusate (Senokot-S) tablet 1 tablet  1 tablet Oral QHS PRN Reyne Dumas, MD      . sodium chloride flush (NS) 0.9 % injection 3 mL  3 mL Intravenous Q12H Reyne Dumas, MD   3 mL at 05/03/16 0016  . sodium chloride flush (NS) 0.9 % injection 3 mL  3 mL Intravenous Q12H Reyne Dumas, MD   3 mL at 05/03/16 0016  . sodium chloride flush (NS) 0.9 % injection 3 mL  3 mL Intravenous PRN Reyne Dumas, MD   3 mL at 05/02/16 2017   Labs: Basic Metabolic Panel:  Recent Labs Lab 05/02/16 1436 05/03/16 0627  NA 136 137  K 4.3 4.2  CL 95* 93*  CO2 28 28  GLUCOSE 102* 133*  BUN 41* 49*  CREATININE 7.95* 8.98*  CALCIUM 8.5* 8.4*   Liver Function Tests:  Recent Labs Lab 05/02/16 1930 05/03/16 0627  AST 14* 13*  ALT 12* 12*  ALKPHOS 111 125  BILITOT 0.6 0.6  PROT 6.0* 5.9*  ALBUMIN 3.3* 3.2*   CBC:  Recent Labs Lab 05/02/16 1436  05/03/16 0627  WBC 8.5 7.5  HGB 10.5* 10.7*  HCT 34.0* 34.7*  MCV 99.4 100.3*  PLT 259 250   Cardiac Enzymes:  Recent Labs Lab 05/02/16 1930 05/03/16 0022 05/03/16 0554  TROPONINI <0.03 <0.03 <0.03   CBG:  Recent Labs Lab 05/02/16 1523  GLUCAP 116*   Studies/Results: Ct Head Wo Contrast  05/03/2016  CLINICAL DATA:  Follow-up evaluation. EXAM: CT HEAD WITHOUT CONTRAST TECHNIQUE: Contiguous axial images were obtained from the base of the skull through the vertex  without intravenous contrast. COMPARISON:  CT HEAD May 02, 2016 FINDINGS: INTRACRANIAL CONTENTS: Similar mixed density RIGHT holo hemispheric subdural hematoma measuring up to 2.7 cm in transaxial dimension. Subjacent sulcal effacement with 4 mm stable RIGHT to LEFT midline shift. Partial effacement of RIGHT lateral ventricle without hydrocephalus or entrapment. No intraparenchymal hemorrhage or acute large vascular territory infarcts. Patchy white matter hypodensities most compatible with chronic small vessel ischemic disease. Basal cisterns are patent. Severe calcific atherosclerosis of the carotid siphons. ORBITS: The included ocular globes and orbital contents are non-suspicious. Status post bilateral ocular lens implants. SINUSES: The mastoid aircells and included paranasal sinuses are well-aerated. SKULL/SOFT TISSUES: No skull fracture. No significant soft tissue swelling. IMPRESSION: Stable large RIGHT mixed density subdural hematoma resulting in 4 mm RIGHT to LEFT midline shift. No ventricular entrapment. Electronically Signed   By: Elon Alas M.D.   On: 05/03/2016 05:15   Ct Head Wo Contrast  05/02/2016  CLINICAL DATA:  Fall today EXAM: CT HEAD WITHOUT CONTRAST TECHNIQUE: Contiguous axial images were obtained from the base of the skull through the vertex without intravenous contrast. COMPARISON:  CT head 02/24/2016 FINDINGS: Large subdural fluid collection on the right. This is predominantly low density but has scattered areas of increased density compatible with acute and chronic subdural hematoma. This measures up to 27 mm in thickness. There is mass-effect on the right hemisphere without edema. 5 mm midline shift to the left. Generalized atrophy.  Negative for acute infarct. Negative for skull fracture. IMPRESSION: Large mixed density subdural hematoma on the right compatible with acute and chronic subdural hematoma. This was not present on the prior CT. 5 mm midline shift to the left. These  results were called by telephone at the time of interpretation on 05/02/2016 at 3:45 Pm to [facility] , who verbally acknowledged these results. Electronically Signed   By: Franchot Gallo M.D.   On: 05/02/2016 16:06    ROS: As per HPI otherwise negative.  Physical Exam: Filed Vitals:   05/03/16 0723 05/03/16 0751 05/03/16 0753 05/03/16 0815  BP: 189/71 175/72 175/72 177/66  Pulse: 68 65  72  Temp:  98.1 F (36.7 C)    TempSrc:  Oral    Resp:      Height:      Weight:      SpO2: 100% 97%       General: Slightly frail appearing male in NAD, pleasant, cooperative Head: Normocephalic, atraumatic, sclera non-icteric, mucus membranes are moist Neck: Supple. JVD not elevated. Lungs: Clear bilaterally to auscultation without wheezes, rales, or rhonchi. Breathing is unlabored. Heart: RRR with S1 S2. No murmurs, rubs, or gallops appreciated. Abdomen: Soft, non-tender, non-distended with normoactive bowel sounds. No rebound/guarding. No obvious abdominal masses. M-S:  Grips equal. LLE appears weaker than RLE.  Lower extremities:without edema or ischemic changes, no open wounds  Neuro: Alert and oriented X 3. Moves all extremities spontaneously.  Psych:  Responds to questions appropriately with a normal affect. Dialysis Access:  Dialysis Orders: [facility] TTS 4 hours 400/auto 1.5 EDW 69.5 kg 2.0K/2.25 Ca UF profile 2 No Heparin Hectorol 2 mcg IV q TTS (last PTH 297 03/16/16) Mircera 100 mcg IV Q 2 weeks (Last dose 04/27/16 HGB 11.0) Venofer 50 mg IV (last dose 04/29/16 Fe 83 Tsat 33% 04/13/16)   Assessment/Plan: 1.  R. Subdural Hematoma: Per primary/Neurosurgery. For Craniotomy/evacuation of subdural hematoma Thursday.  2.  ESRD -  TTS. Will have HD today (Missed HD 06/13) and again off schedule Friday post op craniotomy. No heparin.  3.  Hypertension/volume  - SBP 170s. On metoprolol and hydralazine, PRN labetolol. Wt 72.5-bed wt. Will attempt UFG 3-3.5 liters today.  4.  Anemia  -  HGB 10.7 recent dose ESA. Cont weekly fe.  5.  Metabolic bone disease -  Ca 8.4 cont binders/VDRA.  6.  Nutrition -Albumin 3.2 change to renal diet, add renal vit/prostat 7.  Immunosuppressant therapy S/P cardiac transplant: per primary cont cyclosporine, mycophenolate  Rita H. Owens Shark, NP-C 05/03/2016, 10:16 AM  [employer] [phone]  Pt seen, examined and agree w A/P as above. ESRD pt with falls and new subdural hematoma.  Substantial hematoma w mass effect, to go for surgery tomorrow.  For HD today.  Decrease goal as ^BP may be effect of bleed.   [name] MD [employer] pager [phone]    cell [phone] 05/03/2016, 3:01 PM

## 2016-05-03 NOTE — Progress Notes (Signed)
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 Patient states that he has become progressively more weak over the last 6 weeks , has had 3 falls in the last week. 2 falls on Friday and one fall today. He states that he's had small subdural hematomas since April 2015. Patient states today he was using a rolling walker , lost his balance and fell. He denies any preceding focal symptoms, in the preceding week. Patient was recently seen at [facility] by his cardiologist and his hydralazine dose was increased. He also had a recent 2-D echo done on 04/18/16 was found to have an EF of 60-65% without any segmental wall motion abnormalities. He denies any history of A. fib.  Patient states he has been going to physical therapy for the last 5 weeks without improvement in his symptoms. Patient states he has issues with orthostatic hypotension and gets very dizzy upon standing which she states is typically why he falls down. He does have weakness in his left leg which is been ongoing for approximately one year. He states he often drags it when walking. His hydralazine was recently increased to 75 mg 3 times a day. Marland Kitchen He denies chest pain or shortness of breath. Patient also complains of feeling somewhat dizzy and lightheaded due to poor oral intake over the last couple of days but denies any fever chills,rigors.   Assessment & Plan:   Principal Problem:   Acute on chronic intracranial subdural hematoma (HCC) Active Problems:   Heart transplanted ([place])   Hypertension   DM (diabetes mellitus), type 2 with renal  complications (HCC)   ESRD on dialysis ([place])   Anemia of chronic disease   Depression   Subdural hematoma (HCC)   Acute on chronic intracranial SDH  -Dr. 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 Lorrene Reid. They will see him tomorrow morning, no indication for urgent dialysis, electrolytes reviewed  Anemia of chronic disease -likely anemia secondary to chronic kidney disease, stable  Depression -Prozac 20 mg daily   Dementia  -Aricept 10 mg QHS     DVT prophylaxis: SCD Code Status: Full Family Communication: Wife at bedside Disposition Plan: Her neurosurgery   Consultants:  Dr. Deri Fuelling Neurosurgery NP Jimmye Norman. [facility]    Procedures/Significant Events:    Cultures   Antimicrobials:    Devices    LINES / TUBES:      Continuous Infusions:    Subjective: 6/14 resting comfortably in bed, A/O 4, no questions concerning tomorrow surgery.    Objective: Filed Vitals:   05/03/16 0751 05/03/16 0753 05/03/16 0815 05/03/16 1113  BP: 175/72 175/72 177/66 160/68  Pulse: 65  72 74  Temp: 98.1 F (36.7 C)   97.9 F (36.6 C)  TempSrc:  Oral   Oral  Resp:    16  Height:      Weight:      SpO2: 97%   100%    Intake/Output Summary (Last 24 hours) at 05/03/16 1128 Last data filed at 05/03/16 1045  Gross per 24 hour  Intake    663 ml  Output    225 ml  Net    438 ml   Filed Weights   05/03/16 0554  Weight: 72.9 kg (160 lb 11.5 oz)    Examination:  General: A/O 4, NAD, No acute respiratory distress Eyes: negative scleral hemorrhage, negative anisocoria, negative icterus ENT: Negative  Runny nose, negative gingival bleeding, Neck:  Negative scars, masses, torticollis, lymphadenopathy, JVD Lungs: Clear to auscultation bilaterally without wheezes or crackles Cardiovascular: Regular rate and rhythm without murmur gallop or rub normal S1 and S2 Abdomen: negative abdominal pain, nondistended, positive soft, bowel sounds, no rebound, no ascites, no appreciable mass Extremities: No significant cyanosis, clubbing, or edema bilateral lower extremities Skin: Negative rashes, lesion/blister bridge right nose, negative ulcers Psychiatric:  Negative depression, negative anxiety, negative fatigue, negative mania  Central nervous system:  Cranial nerves II through XII intact, tongue/uvula midline, all extremities muscle strength 5/5, sensation intact throughout, finger nose finger bilateral within normal limits, quick finger touch bilateral within normal limits,negative dysarthria, negative expressive aphasia, negative receptive aphasia.  .     Data Reviewed: Care during the described time interval was provided by me .  I have reviewed this patient's available data, including medical history, events of note, physical examination, and all test results as part of my evaluation. I have personally reviewed and interpreted all radiology studies.  CBC:  Recent Labs Lab 05/02/16 1436 05/03/16 0627  WBC 8.5 7.5  HGB 10.5* 10.7*  HCT 34.0* 34.7*  MCV 99.4 100.3*  PLT 259 [id number]   Basic Metabolic Panel:  Recent Labs Lab 05/02/16 1436 05/02/16 1930 05/03/16 0627  NA 136  --  137  K 4.3  --  4.2  CL 95*  --  93*  CO2 28  --  28  GLUCOSE 102*  --  133*  BUN 41*  --  49*  CREATININE 7.95*  --  8.98*  CALCIUM 8.5*  --  8.4*  MG  --  2.0  --    GFR: Estimated Creatinine Clearance: 7.3 mL/min (by C-G formula based on Cr of 8.98). Liver Function Tests:  Recent Labs Lab 05/02/16 1930 05/03/16 0627  AST 14* 13*  ALT 12* 12*  ALKPHOS 111 125  BILITOT 0.6 0.6  PROT 6.0* 5.9*    ALBUMIN 3.3* 3.2*   No results for input(s): LIPASE, AMYLASE in the last 168 hours. No results for input(s): AMMONIA in the last 168 hours. Coagulation Profile: No results for input(s): INR, PROTIME in the last 168 hours. Cardiac Enzymes:  Recent Labs Lab 05/02/16 1930 05/03/16 0022 05/03/16 0554  TROPONINI <0.03 <0.03 <0.03   BNP (last 3 results) No results for input(s): PROBNP in the last 8760 hours. HbA1C:  Recent Labs  05/02/16 1930  HGBA1C 5.5   CBG:  Recent Labs Lab 05/02/16 1523  GLUCAP 116*   Lipid Profile: No results for input(s): CHOL, HDL, LDLCALC, TRIG, CHOLHDL, LDLDIRECT in the last 72 hours. Thyroid Function Tests:  Recent Labs  05/02/16 1930  TSH 1.405   Anemia Panel: No results for input(s): VITAMINB12, FOLATE, FERRITIN, TIBC, IRON, RETICCTPCT in the last 72 hours. Urine analysis:    Component Value Date/Time  COLORURINE RED* 04/22/2015 1952   APPEARANCEUR TURBID* 04/22/2015 1952   LABSPEC 1.016 04/22/2015 1952   PHURINE 7.0 04/22/2015 1952   GLUCOSEU NEGATIVE 04/22/2015 1952   HGBUR LARGE* 04/22/2015 [place] NEGATIVE 04/22/2015 [place] NEGATIVE 04/22/2015 1952   PROTEINUR >300* 04/22/2015 1952   UROBILINOGEN 1.0 04/22/2015 1952   NITRITE NEGATIVE 04/22/2015 1952   LEUKOCYTESUR LARGE* 04/22/2015 1952   Sepsis Labs: @LABRCNTIP (procalcitonin:4,lacticidven:4)  ) Recent Results (from the past 240 hour(s))  MRSA PCR Screening     Status: None   Collection Time: 05/03/16  1:15 AM  Result Value Ref Range Status   MRSA by PCR NEGATIVE NEGATIVE Final    Comment:        The GeneXpert MRSA Assay (FDA approved for NASAL specimens only), is one component of a comprehensive MRSA colonization surveillance program. It is not intended to diagnose MRSA infection nor to guide or monitor treatment for MRSA infections.          Radiology Studies: Ct Head Wo Contrast  05/03/2016  CLINICAL DATA:  Follow-up  evaluation. EXAM: CT HEAD WITHOUT CONTRAST TECHNIQUE: Contiguous axial images were obtained from the base of the skull through the vertex without intravenous contrast. COMPARISON:  CT HEAD May 02, 2016 FINDINGS: INTRACRANIAL CONTENTS: Similar mixed density RIGHT holo hemispheric subdural hematoma measuring up to 2.7 cm in transaxial dimension. Subjacent sulcal effacement with 4 mm stable RIGHT to LEFT midline shift. Partial effacement of RIGHT lateral ventricle without hydrocephalus or entrapment. No intraparenchymal hemorrhage or acute large vascular territory infarcts. Patchy white matter hypodensities most compatible with chronic small vessel ischemic disease. Basal cisterns are patent. Severe calcific atherosclerosis of the carotid siphons. ORBITS: The included ocular globes and orbital contents are non-suspicious. Status post bilateral ocular lens implants. SINUSES: The mastoid aircells and included paranasal sinuses are well-aerated. SKULL/SOFT TISSUES: No skull fracture. No significant soft tissue swelling. IMPRESSION: Stable large RIGHT mixed density subdural hematoma resulting in 4 mm RIGHT to LEFT midline shift. No ventricular entrapment. Electronically Signed   By: Elon Alas M.D.   On: 05/03/2016 05:15   Ct Head Wo Contrast  05/02/2016  CLINICAL DATA:  Fall today EXAM: CT HEAD WITHOUT CONTRAST TECHNIQUE: Contiguous axial images were obtained from the base of the skull through the vertex without intravenous contrast. COMPARISON:  CT head 02/24/2016 FINDINGS: Large subdural fluid collection on the right. This is predominantly low density but has scattered areas of increased density compatible with acute and chronic subdural hematoma. This measures up to 27 mm in thickness. There is mass-effect on the right hemisphere without edema. 5 mm midline shift to the left. Generalized atrophy.  Negative for acute infarct. Negative for skull fracture. IMPRESSION: Large mixed density subdural hematoma on  the right compatible with acute and chronic subdural hematoma. This was not present on the prior CT. 5 mm midline shift to the left. These results were called by telephone at the time of interpretation on 05/02/2016 at 3:45 Pm to [facility] , who verbally acknowledged these results. Electronically Signed   By: Franchot Gallo M.D.   On: 05/02/2016 16:06        Scheduled Meds: . calcium acetate  667 mg Oral TID WC  . cycloSPORINE modified  50 mg Oral BID  . donepezil  10 mg Oral QHS  . [START ON 05/04/2016] doxercalciferol  2 mcg Intravenous Q T,Th,Sa-HD  . [START ON 05/04/2016] ferric gluconate (FERRLECIT/NULECIT) IV  62.5 mg Intravenous Once  . hydrALAZINE  75 mg Oral Q8H  . levothyroxine  75 mcg Oral QAC breakfast  . metoprolol tartrate  25 mg Oral BID  . multivitamin  1 tablet Oral q morning - 10a  . mycophenolate  250 mg Oral QHS  . mycophenolate  500 mg Oral q morning - 10a  . pantoprazole  40 mg Oral Daily  . pravastatin  10 mg Oral QHS  . sodium chloride flush  3 mL Intravenous Q12H  . sodium chloride flush  3 mL Intravenous Q12H   Continuous Infusions:    LOS: 1 day    Time spent: 40 minutes    Ashantae Pangallo, Geraldo Docker, MD [facility] Pager [phone]   If 7PM-7AM, please contact night-coverage [email or web address] Password [facility] 05/03/2016, 11:28 AM

## 2016-05-03 NOTE — Progress Notes (Signed)
No new issues or problems overnight.   And mild left-sided weakness.  Patient with large multiloculated chronic subdural hematoma on the right with mass effect hemorrhage.  I have discussed situation with the patient.  This lesion will not improve with time or conservative management.  I do not believe this simple burr hole evacuation will be successful given the multiloculated nature of this fluid collection.   I have recommended that he undergo a  right sided craniotomy with evacuation of his subdural hematoma. I have discussed the risks and benefits of this procedure. He patients seems to understand and agrees to proceed. Plan for surgery Thursday am if medically stable.

## 2016-05-04 ENCOUNTER — Inpatient Hospital Stay (HOSPITAL_COMMUNITY): Payer: Medicare HMO | Admitting: Anesthesiology

## 2016-05-04 ENCOUNTER — Inpatient Hospital Stay (HOSPITAL_COMMUNITY): Payer: Medicare HMO

## 2016-05-04 ENCOUNTER — Encounter (HOSPITAL_COMMUNITY)
Admission: EM | Disposition: A | Discharge status: Home Health | Payer: Self-pay | Source: Home / Self Care | Attending: Neurosurgery

## 2016-05-04 DIAGNOSIS — S065XAA Traumatic subdural hemorrhage with loss of consciousness status unknown, initial encounter: Secondary | ICD-10-CM | POA: Diagnosis present

## 2016-05-04 DIAGNOSIS — S065X9A Traumatic subdural hemorrhage with loss of consciousness of unspecified duration, initial encounter: Secondary | ICD-10-CM | POA: Diagnosis present

## 2016-05-04 HISTORY — PX: CRANIOTOMY: SHX93

## 2016-05-04 LAB — RENAL FUNCTION PANEL
Albumin: 2.9 g/dL — ABNORMAL LOW (ref 3.5–5.0)
Anion gap: 13 (ref 5–15)
BUN: 22 mg/dL — ABNORMAL HIGH (ref 6–20)
CO2: 23 mmol/L (ref 22–32)
Calcium: 8.3 mg/dL — ABNORMAL LOW (ref 8.9–10.3)
Chloride: 97 mmol/L — ABNORMAL LOW (ref 101–111)
Creatinine, Ser: 5.51 mg/dL — ABNORMAL HIGH (ref 0.61–1.24)
GFR calc Af Amer: 11 mL/min — ABNORMAL LOW (ref >60)
GFR calc non Af Amer: 9 mL/min — ABNORMAL LOW (ref >60)
Glucose, Bld: 149 mg/dL — ABNORMAL HIGH (ref 65–99)
Phosphorus: 4.6 mg/dL (ref 2.5–4.6)
Potassium: 4.3 mmol/L (ref 3.5–5.1)
Sodium: 133 mmol/L — ABNORMAL LOW (ref 135–145)

## 2016-05-04 LAB — BASIC METABOLIC PANEL
Anion gap: 11 (ref 5–15)
Anion gap: 9 (ref 5–15)
BUN: 16 mg/dL (ref 6–20)
BUN: 19 mg/dL (ref 6–20)
CALCIUM: 8.5 mg/dL — AB (ref 8.9–10.3)
CALCIUM: 8 mg/dL — AB (ref 8.9–10.3)
CO2: 28 mmol/L (ref 22–32)
CO2: 27 mmol/L (ref 22–32)
CREATININE: 4.58 mg/dL — AB (ref 0.61–1.24)
CREATININE: 5.09 mg/dL — AB (ref 0.61–1.24)
Chloride: 96 mmol/L — ABNORMAL LOW (ref 101–111)
Chloride: 99 mmol/L — ABNORMAL LOW (ref 101–111)
GFR calc Af Amer: 13 mL/min — ABNORMAL LOW (ref >60)
GFR calc Af Amer: 12 mL/min — ABNORMAL LOW (ref >60)
GFR calc non Af Amer: 11 mL/min — ABNORMAL LOW (ref >60)
GFR, EST NON AFRICAN AMERICAN: 10 mL/min — AB (ref >60)
GLUCOSE: 123 mg/dL — AB (ref 65–99)
GLUCOSE: 131 mg/dL — AB (ref 65–99)
Potassium: 4.2 mmol/L (ref 3.5–5.1)
Potassium: 4.1 mmol/L (ref 3.5–5.1)
SODIUM: 135 mmol/L (ref 135–145)
Sodium: 135 mmol/L (ref 135–145)

## 2016-05-04 LAB — CBC
HCT: 31.5 % — ABNORMAL LOW (ref 39.0–52.0)
HEMATOCRIT: 32.3 % — AB (ref 39.0–52.0)
Hemoglobin: 9.7 g/dL — ABNORMAL LOW (ref 13.0–17.0)
Hemoglobin: 9.5 g/dL — ABNORMAL LOW (ref 13.0–17.0)
MCH: 29.9 pg (ref 26.0–34.0)
MCH: 29.9 pg (ref 26.0–34.0)
MCHC: 30 g/dL (ref 30.0–36.0)
MCHC: 30.2 g/dL (ref 30.0–36.0)
MCV: 99.7 fL (ref 78.0–100.0)
MCV: 99.1 fL (ref 78.0–100.0)
PLATELETS: 207 10*3/uL (ref 150–400)
Platelets: 221 10*3/uL (ref 150–400)
RBC: 3.24 MIL/uL — ABNORMAL LOW (ref 4.22–5.81)
RBC: 3.18 MIL/uL — ABNORMAL LOW (ref 4.22–5.81)
RDW: 16 % — AB (ref 11.5–15.5)
RDW: 16 % — ABNORMAL HIGH (ref 11.5–15.5)
WBC: 8.9 10*3/uL (ref 4.0–10.5)
WBC: 9.2 K/uL (ref 4.0–10.5)

## 2016-05-04 LAB — PROTIME-INR
INR: 1.19 (ref 0.00–1.49)
PROTHROMBIN TIME: 15.3 s — AB (ref 11.6–15.2)

## 2016-05-04 LAB — GLUCOSE, CAPILLARY
GLUCOSE-CAPILLARY: 123 mg/dL — AB (ref 65–99)
GLUCOSE-CAPILLARY: 83 mg/dL (ref 65–99)

## 2016-05-04 LAB — MAGNESIUM: Magnesium: 2 mg/dL (ref 1.7–2.4)

## 2016-05-04 SURGERY — CRANIOTOMY HEMATOMA EVACUATION SUBDURAL
Anesthesia: General | Site: Head | Laterality: Right

## 2016-05-04 MED ORDER — FENTANYL CITRATE (PF) 100 MCG/2ML IJ SOLN
INTRAMUSCULAR | Status: DC | PRN
  Administered 2016-05-04: 250 ug via INTRAVENOUS

## 2016-05-04 MED ORDER — DEXMEDETOMIDINE HCL IN NACL 200 MCG/50ML IV SOLN: 0.4000 ug/kg/h | INTRAVENOUS | Status: DC

## 2016-05-04 MED ORDER — PROPOFOL 10 MG/ML IV BOLUS
INTRAVENOUS | Status: AC
Start: 2016-05-04 — End: 2016-05-04
  Filled 2016-05-04: qty 20

## 2016-05-04 MED ORDER — PHENYLEPHRINE HCL 10 MG/ML IJ SOLN
INTRAMUSCULAR | Status: DC | PRN
  Administered 2016-05-04: 80 ug via INTRAVENOUS

## 2016-05-04 MED ORDER — FAMOTIDINE IN NACL 20-0.9 MG/50ML-% IV SOLN
20.0000 mg | Freq: Two times a day (BID) | INTRAVENOUS | Status: DC
  Administered 2016-05-04 – 2016-05-05 (×3): 20 mg via INTRAVENOUS
  Filled 2016-05-04 (×3): qty 50

## 2016-05-04 MED ORDER — CEFAZOLIN SODIUM-DEXTROSE 2-4 GM/100ML-% IV SOLN
INTRAVENOUS | Status: AC
  Filled 2016-05-04: qty 100

## 2016-05-04 MED ORDER — SODIUM CHLORIDE 0.9 % IJ SOLN
INTRAMUSCULAR | Status: AC
  Filled 2016-05-04: qty 10

## 2016-05-04 MED ORDER — HYDRALAZINE HCL 20 MG/ML IJ SOLN
INTRAMUSCULAR | Status: AC
  Filled 2016-05-04: qty 1

## 2016-05-04 MED ORDER — ESMOLOL HCL 100 MG/10ML IV SOLN
INTRAVENOUS | Status: DC | PRN
  Administered 2016-05-04 (×2): 30 mg via INTRAVENOUS

## 2016-05-04 MED ORDER — DEXAMETHASONE SODIUM PHOSPHATE 4 MG/ML IJ SOLN
4.0000 mg | Freq: Three times a day (TID) | INTRAMUSCULAR | Status: DC
  Administered 2016-05-07 – 2016-05-09 (×8): 4 mg via INTRAVENOUS
  Filled 2016-05-04 (×9): qty 1

## 2016-05-04 MED ORDER — HYDROCODONE-ACETAMINOPHEN 5-325 MG PO TABS
1.0000 | ORAL_TABLET | ORAL | Status: DC | PRN
  Administered 2016-05-04 – 2016-05-05 (×3): 1 via ORAL
  Filled 2016-05-04 (×2): qty 1

## 2016-05-04 MED ORDER — LIDOCAINE 2% (20 MG/ML) 5 ML SYRINGE
INTRAMUSCULAR | Status: AC
  Filled 2016-05-04: qty 5

## 2016-05-04 MED ORDER — HEMOSTATIC AGENTS (NO CHARGE) OPTIME
TOPICAL | Status: DC | PRN
  Administered 2016-05-04: 1 via TOPICAL

## 2016-05-04 MED ORDER — CEFAZOLIN SODIUM-DEXTROSE 2-3 GM-% IV SOLR
2.0000 g | Freq: Once | INTRAVENOUS | Status: AC
  Administered 2016-05-04: 2 g via INTRAVENOUS

## 2016-05-04 MED ORDER — SODIUM CHLORIDE 0.9 % IR SOLN
Status: DC | PRN
  Administered 2016-05-04: 08:00:00

## 2016-05-04 MED ORDER — GLYCOPYRROLATE 0.2 MG/ML IV SOSY
PREFILLED_SYRINGE | INTRAVENOUS | Status: AC
  Filled 2016-05-04: qty 3

## 2016-05-04 MED ORDER — FENTANYL CITRATE (PF) 100 MCG/2ML IJ SOLN
INTRAMUSCULAR | Status: AC
  Administered 2016-05-04: 25 ug via INTRAVENOUS
  Filled 2016-05-04: qty 2

## 2016-05-04 MED ORDER — ROCURONIUM BROMIDE 50 MG/5ML IV SOLN
INTRAVENOUS | Status: AC
Start: 2016-05-04 — End: 2016-05-04
  Filled 2016-05-04: qty 2

## 2016-05-04 MED ORDER — PROPOFOL 10 MG/ML IV BOLUS
INTRAVENOUS | Status: DC | PRN
  Administered 2016-05-04: 70 mg via INTRAVENOUS

## 2016-05-04 MED ORDER — 0.9 % SODIUM CHLORIDE (POUR BTL) OPTIME
TOPICAL | Status: DC | PRN
  Administered 2016-05-04 (×2): 1000 mL

## 2016-05-04 MED ORDER — LABETALOL HCL 5 MG/ML IV SOLN
INTRAVENOUS | Status: AC
  Filled 2016-05-04: qty 4

## 2016-05-04 MED ORDER — LABETALOL HCL 5 MG/ML IV SOLN
10.0000 mg | INTRAVENOUS | Status: DC | PRN
  Administered 2016-05-04: 5 mg via INTRAVENOUS
  Administered 2016-05-04 – 2016-05-05 (×3): 20 mg via INTRAVENOUS
  Filled 2016-05-04: qty 8
  Filled 2016-05-04: qty 4

## 2016-05-04 MED ORDER — FLUOXETINE HCL 20 MG PO CAPS
20.0000 mg | ORAL_CAPSULE | Freq: Every day | ORAL | Status: DC
  Administered 2016-05-05 – 2016-05-10 (×5): 20 mg via ORAL
  Filled 2016-05-04 (×5): qty 1

## 2016-05-04 MED ORDER — LIDOCAINE-EPINEPHRINE 1 %-1:100000 IJ SOLN
INTRAMUSCULAR | Status: DC | PRN
  Administered 2016-05-04: 10 mL

## 2016-05-04 MED ORDER — SODIUM CHLORIDE 0.9 % IV SOLN
500.0000 mg | Freq: Two times a day (BID) | INTRAVENOUS | Status: DC
  Administered 2016-05-04 – 2016-05-07 (×5): 500 mg via INTRAVENOUS
  Filled 2016-05-04 (×7): qty 5

## 2016-05-04 MED ORDER — FENTANYL CITRATE (PF) 250 MCG/5ML IJ SOLN
INTRAMUSCULAR | Status: AC
  Filled 2016-05-04: qty 5

## 2016-05-04 MED ORDER — SODIUM CHLORIDE 0.9 % IV SOLN: 100.0000 mL | INTRAVENOUS | Status: DC | PRN

## 2016-05-04 MED ORDER — PROMETHAZINE HCL 25 MG PO TABS: 12.5000 mg | ORAL_TABLET | ORAL | Status: DC | PRN

## 2016-05-04 MED ORDER — PHENYLEPHRINE HCL 10 MG/ML IJ SOLN
10.0000 mg | INTRAMUSCULAR | Status: DC | PRN
  Administered 2016-05-04: 25 ug/min via INTRAVENOUS

## 2016-05-04 MED ORDER — SURGIFOAM 100 EX MISC
CUTANEOUS | Status: DC | PRN
  Administered 2016-05-04: 08:00:00 via TOPICAL

## 2016-05-04 MED ORDER — CEFAZOLIN SODIUM-DEXTROSE 2-4 GM/100ML-% IV SOLN
2.0000 g | INTRAVENOUS | Status: AC
  Administered 2016-05-05: 2 g via INTRAVENOUS
  Filled 2016-05-04: qty 100

## 2016-05-04 MED ORDER — ARTIFICIAL TEARS OP OINT
TOPICAL_OINTMENT | OPHTHALMIC | Status: DC | PRN
  Administered 2016-05-04: 1 via OPHTHALMIC

## 2016-05-04 MED ORDER — METOPROLOL TARTRATE 5 MG/5ML IV SOLN: 5.0000 mg | Freq: Four times a day (QID) | INTRAVENOUS | Status: DC

## 2016-05-04 MED ORDER — ONDANSETRON HCL 4 MG/2ML IJ SOLN
INTRAMUSCULAR | Status: AC
  Filled 2016-05-04: qty 2

## 2016-05-04 MED ORDER — DEXAMETHASONE SODIUM PHOSPHATE 10 MG/ML IJ SOLN
6.0000 mg | Freq: Four times a day (QID) | INTRAMUSCULAR | Status: AC
  Administered 2016-05-04 – 2016-05-05 (×4): 6 mg via INTRAVENOUS
  Filled 2016-05-04 (×4): qty 1

## 2016-05-04 MED ORDER — ONDANSETRON HCL 4 MG/2ML IJ SOLN
INTRAMUSCULAR | Status: DC | PRN
  Administered 2016-05-04: 4 mg via INTRAVENOUS

## 2016-05-04 MED ORDER — NALOXONE HCL 0.4 MG/ML IJ SOLN: 0.0800 mg | INTRAMUSCULAR | Status: DC | PRN

## 2016-05-04 MED ORDER — NEOSTIGMINE METHYLSULFATE 5 MG/5ML IV SOSY
PREFILLED_SYRINGE | INTRAVENOUS | Status: AC
  Filled 2016-05-04: qty 5

## 2016-05-04 MED ORDER — ROCURONIUM BROMIDE 100 MG/10ML IV SOLN
INTRAVENOUS | Status: DC | PRN
  Administered 2016-05-04: 40 mg via INTRAVENOUS

## 2016-05-04 MED ORDER — CEFAZOLIN SODIUM-DEXTROSE 2-4 GM/100ML-% IV SOLN
2.0000 g | Freq: Three times a day (TID) | INTRAVENOUS | Status: DC
  Administered 2016-05-04: 2 g via INTRAVENOUS

## 2016-05-04 MED ORDER — HYDRALAZINE HCL 20 MG/ML IJ SOLN
10.0000 mg | INTRAMUSCULAR | Status: DC | PRN
  Administered 2016-05-04 – 2016-05-07 (×3): 20 mg via INTRAVENOUS
  Filled 2016-05-04 (×2): qty 1

## 2016-05-04 MED ORDER — HYDRALAZINE HCL 50 MG PO TABS
50.0000 mg | ORAL_TABLET | Freq: Three times a day (TID) | ORAL | Status: DC
  Administered 2016-05-04 – 2016-05-07 (×8): 50 mg via ORAL
  Filled 2016-05-04 (×8): qty 1

## 2016-05-04 MED ORDER — DEXAMETHASONE SODIUM PHOSPHATE 4 MG/ML IJ SOLN
4.0000 mg | Freq: Four times a day (QID) | INTRAMUSCULAR | Status: AC
  Administered 2016-05-05 – 2016-05-06 (×3): 4 mg via INTRAVENOUS
  Filled 2016-05-04 (×3): qty 1

## 2016-05-04 MED ORDER — HYDROMORPHONE HCL 1 MG/ML IJ SOLN: 0.5000 mg | INTRAMUSCULAR | Status: DC | PRN

## 2016-05-04 MED ORDER — VITAMIN B-12 1000 MCG PO TABS
1000.0000 ug | ORAL_TABLET | Freq: Every day | ORAL | Status: DC
  Administered 2016-05-05 – 2016-05-10 (×4): 1000 ug via ORAL
  Filled 2016-05-04 (×5): qty 1

## 2016-05-04 MED ORDER — GLYCOPYRROLATE 0.2 MG/ML IJ SOLN
INTRAMUSCULAR | Status: DC | PRN
  Administered 2016-05-04: .8 mg via INTRAVENOUS

## 2016-05-04 MED ORDER — ARTIFICIAL TEARS OP OINT
TOPICAL_OINTMENT | OPHTHALMIC | Status: AC
  Filled 2016-05-04: qty 3.5

## 2016-05-04 MED ORDER — ASPIRIN 81 MG PO CHEW
81.0000 mg | CHEWABLE_TABLET | Freq: Every day | ORAL | Status: DC
  Administered 2016-05-04 – 2016-05-09 (×6): 81 mg via ORAL
  Filled 2016-05-04 (×6): qty 1

## 2016-05-04 MED ORDER — FENTANYL CITRATE (PF) 100 MCG/2ML IJ SOLN
25.0000 ug | INTRAMUSCULAR | Status: DC | PRN
  Administered 2016-05-04 (×2): 25 ug via INTRAVENOUS

## 2016-05-04 MED ORDER — NEOSTIGMINE METHYLSULFATE 10 MG/10ML IV SOLN
INTRAVENOUS | Status: DC | PRN
  Administered 2016-05-04: 5 mg via INTRAVENOUS

## 2016-05-04 SURGICAL SUPPLY — 73 items
BAG DECANTER FOR FLEXI CONT (MISCELLANEOUS) ×3 IMPLANT
BANDAGE GAUZE 4  KLING STR (GAUZE/BANDAGES/DRESSINGS) IMPLANT
BIT DRILL WIRE PASS 1.3MM (BIT) IMPLANT
BLADE SURG 11 STRL SS (BLADE) ×3 IMPLANT
BNDG COHESIVE 4X5 TAN NS LF (GAUZE/BANDAGES/DRESSINGS) IMPLANT
BRUSH SCRUB EZ 1% IODOPHOR (MISCELLANEOUS) IMPLANT
BRUSH SCRUB EZ PLAIN DRY (MISCELLANEOUS) IMPLANT
BUR ACORN 6.0 PRECISION (BURR) ×2 IMPLANT
BUR ACORN 6.0MM PRECISION (BURR) ×1
BUR SPIRAL ROUTER 2.3 (BUR) ×2 IMPLANT
BUR SPIRAL ROUTER 2.3MM (BUR) ×1
CANISTER SUCT 3000ML PPV (MISCELLANEOUS) ×6 IMPLANT
CLIP TI MEDIUM 6 (CLIP) IMPLANT
COVER BACK TABLE 60X90IN (DRAPES) IMPLANT
DRAIN CHANNEL 10M FLAT 3/4 FLT (DRAIN) ×3 IMPLANT
DRAPE MICROSCOPE LEICA (MISCELLANEOUS) IMPLANT
DRAPE NEUROLOGICAL W/INCISE (DRAPES) ×3 IMPLANT
DRAPE SURG 17X23 STRL (DRAPES) IMPLANT
DRAPE WARM FLUID 44X44 (DRAPE) ×3 IMPLANT
DRILL WIRE PASS 1.3MM (BIT)
ELECT CAUTERY BLADE 6.4 (BLADE) IMPLANT
ELECT REM PT RETURN 9FT ADLT (ELECTROSURGICAL) ×3
ELECTRODE REM PT RTRN 9FT ADLT (ELECTROSURGICAL) ×1 IMPLANT
EVACUATOR SILICONE 100CC (DRAIN) IMPLANT
GAUZE SPONGE 4X4 12PLY STRL (GAUZE/BANDAGES/DRESSINGS) ×3 IMPLANT
GAUZE SPONGE 4X4 16PLY XRAY LF (GAUZE/BANDAGES/DRESSINGS) IMPLANT
GLOVE BIOGEL PI IND STRL 7.0 (GLOVE) ×4 IMPLANT
GLOVE BIOGEL PI IND STRL 7.5 (GLOVE) ×1 IMPLANT
GLOVE BIOGEL PI INDICATOR 7.0 (GLOVE) ×8
GLOVE BIOGEL PI INDICATOR 7.5 (GLOVE) ×2
GLOVE ECLIPSE 7.0 STRL STRAW (GLOVE) ×3 IMPLANT
GLOVE ECLIPSE 9.0 STRL (GLOVE) ×3 IMPLANT
GLOVE EXAM NITRILE LRG STRL (GLOVE) IMPLANT
GLOVE EXAM NITRILE MD LF STRL (GLOVE) IMPLANT
GLOVE EXAM NITRILE XL STR (GLOVE) IMPLANT
GLOVE EXAM NITRILE XS STR PU (GLOVE) IMPLANT
GOWN STRL REUS W/ TWL LRG LVL3 (GOWN DISPOSABLE) ×2 IMPLANT
GOWN STRL REUS W/ TWL XL LVL3 (GOWN DISPOSABLE) ×1 IMPLANT
GOWN STRL REUS W/TWL 2XL LVL3 (GOWN DISPOSABLE) IMPLANT
GOWN STRL REUS W/TWL LRG LVL3 (GOWN DISPOSABLE) ×4
GOWN STRL REUS W/TWL XL LVL3 (GOWN DISPOSABLE) ×2
HEMOSTAT SURGICEL 2X14 (HEMOSTASIS) ×3 IMPLANT
KIT BASIN OR (CUSTOM PROCEDURE TRAY) ×3 IMPLANT
KIT ROOM TURNOVER OR (KITS) ×3 IMPLANT
LIQUID BAND (GAUZE/BANDAGES/DRESSINGS) IMPLANT
NEEDLE HYPO 25X1 1.5 SAFETY (NEEDLE) ×3 IMPLANT
NS IRRIG 1000ML POUR BTL (IV SOLUTION) ×6 IMPLANT
PACK CRANIOTOMY (CUSTOM PROCEDURE TRAY) ×3 IMPLANT
PAD ARMBOARD 7.5X6 YLW CONV (MISCELLANEOUS) ×9 IMPLANT
PAD EYE OVAL STERILE LF (GAUZE/BANDAGES/DRESSINGS) IMPLANT
PATTIES SURGICAL .25X.25 (GAUZE/BANDAGES/DRESSINGS) IMPLANT
PATTIES SURGICAL .5 X.5 (GAUZE/BANDAGES/DRESSINGS) IMPLANT
PATTIES SURGICAL .5 X3 (DISPOSABLE) IMPLANT
PATTIES SURGICAL 1X1 (DISPOSABLE) IMPLANT
PIN MAYFIELD SKULL DISP (PIN) IMPLANT
PLATE 1.5  2HOLE LNG NEURO (Plate) ×6 IMPLANT
PLATE 1.5 2HOLE LNG NEURO (Plate) ×3 IMPLANT
SCREW SELF DRILL HT 1.5/4MM (Screw) ×18 IMPLANT
SPECIMEN JAR SMALL (MISCELLANEOUS) IMPLANT
SPONGE LAP 18X18 X RAY DECT (DISPOSABLE) IMPLANT
SPONGE NEURO XRAY DETECT 1X3 (DISPOSABLE) IMPLANT
SPONGE SURGIFOAM ABS GEL 100 (HEMOSTASIS) ×3 IMPLANT
STAPLER VISISTAT 35W (STAPLE) ×3 IMPLANT
STOCKINETTE 6  STRL (DRAPES) ×2
STOCKINETTE 6 STRL (DRAPES) ×1 IMPLANT
SUT NURALON 4 0 TR CR/8 (SUTURE) ×6 IMPLANT
SUT VIC AB 2-0 CT2 18 VCP726D (SUTURE) ×6 IMPLANT
SYR CONTROL 10ML LL (SYRINGE) IMPLANT
TOWEL OR 17X24 6PK STRL BLUE (TOWEL DISPOSABLE) IMPLANT
TOWEL OR 17X26 10 PK STRL BLUE (TOWEL DISPOSABLE) ×3 IMPLANT
TRAY FOLEY W/METER SILVER 16FR (SET/KITS/TRAYS/PACK) ×3 IMPLANT
UNDERPAD 30X30 INCONTINENT (UNDERPADS AND DIAPERS) IMPLANT
WATER STERILE IRR 1000ML POUR (IV SOLUTION) ×3 IMPLANT

## 2016-05-04 NOTE — Progress Notes (Signed)
[facility] Progress Note Patient Name: [name] [name] DOB: 05-08-1941 MRN: [id number]   Date of Service  05/04/2016  HPI/Events of Note  Hypertension persists  eICU Interventions  Adjust hydralazine dose     Intervention Category Intermediate Interventions: Hypertension - evaluation and management  Simonne Maffucci 05/04/2016, 7:00 PM

## 2016-05-04 NOTE — Progress Notes (Signed)
eLink Physician-Brief Progress Note Patient Name: Tania Frieling DOB: [birth year] MRN: [id number]   Date of Service  05/04/2016  HPI/Events of Note  S/p SDH evacuation Hypertensive > [id number] systolic Takes metoprolol at home  eICU Interventions  Start IV metoprolol 5mg  q6h     Intervention Category Major Interventions: Hypertension - evaluation and management Evaluation Type: New Patient Evaluation  Simonne Maffucci 05/04/2016, 6:21 PM

## 2016-05-04 NOTE — Anesthesia Procedure Notes (Addendum)
Procedure Name: Intubation Date/Time: 05/04/2016 7:33 AM Performed by: Mariea Clonts Pre-anesthesia Checklist: Emergency Drugs available, Timeout performed, Suction available, Patient identified and Patient being monitored Patient Re-evaluated:Patient Re-evaluated prior to inductionOxygen Delivery Method: Circle system utilized Preoxygenation: Pre-oxygenation with 100% oxygen Intubation Type: IV induction Ventilation: Mask ventilation without difficulty and Oral airway inserted - appropriate to patient size Laryngoscope Size: Sabra Heck and 2 Grade View: Grade I Tube type: Oral Tube size: 8.0 mm Number of attempts: 1 Placement Confirmation: ETT inserted through vocal cords under direct vision,  breath sounds checked- equal and bilateral and positive ETCO2 Tube secured with: Tape Dental Injury: Teeth and Oropharynx as per pre-operative assessment    Central Venous Catheter Insertion Performed by: anesthesiologist 05/04/2016 8:10 AM Patient location: Pre-op. Preanesthetic checklist: patient identified, IV checked, site marked, risks and benefits discussed, surgical consent, monitors and equipment checked, pre-op evaluation, timeout performed and anesthesia consent Position: Trendelenburg Lidocaine 1% used for infiltration Landmarks identified Catheter size: 8 Fr Central line was placed.Double lumen Procedure performed using ultrasound guided technique. Attempts: 1 Following insertion, dressing applied and line sutured. Post procedure assessment: blood return through all ports. Patient tolerated the procedure well with no immediate complications. Additional procedure comments: Ultrasound confirmation of wire in IJ was poor. Transduced to air with reasonable CVP height before dilation of vessel.

## 2016-05-04 NOTE — Anesthesia Postprocedure Evaluation (Signed)
Anesthesia Post Note  Patient: [name] [name]  Procedure(s) Performed: Procedure(s) (LRB): Right Craniotomy for Evacuation of Subdural Hematoma (Right)  Patient location during evaluation: PACU Anesthesia Type: General Level of consciousness: sedated Pain management: satisfactory to patient Vital Signs Assessment: post-procedure vital signs reviewed and stable Respiratory status: spontaneous breathing Cardiovascular status: stable Anesthetic complications: no Comments: Says he has no pain and is doing well. Looks good    Last Vitals:  Filed Vitals:   05/04/16 1045 05/04/16 1100  BP: 138/63 134/64  Pulse: 74 72  Temp:  36.4 C  Resp: 9 12    Last Pain:  Filed Vitals:   05/04/16 1104  PainSc: 0-No pain                 Leandria Thier EDWARD

## 2016-05-04 NOTE — H&P (View-Only) (Signed)
No new issues or problems overnight.   And mild left-sided weakness.  Patient with large multiloculated chronic subdural hematoma on the right with mass effect hemorrhage.  I have discussed situation with the patient.  This lesion will not improve with time or conservative management.  I do not believe this simple burr hole evacuation will be successful given the multiloculated nature of this fluid collection.   I have recommended that he undergo a  right sided craniotomy with evacuation of his subdural hematoma. I have discussed the risks and benefits of this procedure. He patients seems to understand and agrees to proceed. Plan for surgery Thursday am if medically stable.

## 2016-05-04 NOTE — Op Note (Signed)
Date of procedure: 05/04/2016  Date of dictation: Same  Service: Neurosurgery  Preoperative diagnosis: Right convexity chronic subdural hematoma with some mixed acute posttraumatic subdural hematoma  Postoperative diagnosis: Same  Procedure Name: Right frontal parietal craniotomy with evacuation of subdural hematoma. Placement of subdural drain.  Surgeon:Lowen Mansouri A.Rylynn Kobs, M.D.  Asst. Surgeon: Ditty  Anesthesia: General  Indication: 75 year old male with history of multiple falls and trauma presents with increasing left-sided weakness and gait instability. Workup demonstrates evidence of a large right convexity chronic subdural hematoma with obvious capsule and septation formation. Patient presents now for craniotomy and evacuation of subdural hematoma.  Operative note: After induction of anesthesia, patient position supine with his head turned toward the left. Patient's right scalp prepped and draped sterilely. Incision made. Retractor placed. Craniotomy flap developed using high-speed drill. Bone flap elevated. Dura incised. Dura was then flapped along the inferior border of the craniotomy. Subdural membrane readily apparent. This was coagulated and opened. The membrane itself was resected only to the extent of the craniotomy. The underlying brain itself was scarred and sunken. There where dense peel adhesions but no obvious membrane upon the brain itself. I did not attempt to dissected any of the peel adhesions. There was no evidence of active bleeding. The subdural space was evacuated of all chronic fluid with irrigation. A 10 mm Blake drain was left in the subdural space. Dura was loosely reapproximated. Skull was re-approximated using OsteoMed plates. Scalp was reapproximated using 2-0 Vicryl sutures. Staples were applied to the surface. There were no apparent complications. Patient tolerated the procedure well and he returns to the recovery room postop.

## 2016-05-04 NOTE — Progress Notes (Signed)
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 Patient states that he has become progressively more weak over the last 6 weeks , has had 3 falls in the last week. 2 falls on Friday and one fall today. He states that he's had small subdural hematomas since April 2015. Patient states today he was using a rolling walker , lost his balance and fell. He denies any preceding focal symptoms, in the preceding week. Patient was recently seen at [facility] by his cardiologist and his hydralazine dose was increased. He also had a recent 2-D echo done on 04/18/16 was found to have an EF of 60-65% without any segmental wall motion abnormalities. He denies any history of A. fib.  Patient states he has been going to physical therapy for the last 5 weeks without improvement in his symptoms. Patient states he has issues with orthostatic hypotension and gets very dizzy upon standing which she states is typically why he falls down. He does have weakness in his left leg which is been ongoing for approximately one year. He states he often drags it when walking. His hydralazine was recently increased to 75 mg 3 times a day. Marland Kitchen He denies chest pain or shortness of breath. Patient also complains of feeling somewhat dizzy and lightheaded due to poor oral intake over the last couple of days but denies any fever chills,rigors.   Assessment & Plan:   Principal Problem:   Acute on chronic intracranial subdural hematoma (HCC) Active Problems:   Heart transplanted ([place])   Hypertension   DM (diabetes mellitus), type 2 with renal  complications (HCC)   ESRD on dialysis ([place])   Anemia of chronic disease   Depression   Subdural hematoma (HCC)   Traumatic subdural hematoma (HCC)   Acute on chronic intracranial SDH  -6/15 Dr. Deri Fuelling Neurosurgery has had patient down performing surgery all day.  -Will speak with neurosurgery in the A.m. and determine if they would like [place] to remain as a consult.  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 Lorrene Reid.   Anemia of chronic disease -likely anemia secondary to chronic kidney disease, stable  Depression -Prozac 20 mg daily   Dementia  -Aricept 10 mg QHS     DVT prophylaxis: SCD Code Status: Full Family Communication: Wife at bedside Disposition Plan: Her neurosurgery   Consultants:  Dr. Deri Fuelling Neurosurgery NP Jimmye Norman. [facility]    Procedures/Significant Events:    Cultures   Antimicrobials:    Devices    LINES / TUBES:      Continuous Infusions:    Subjective:  6/15 unable to evaluate secondary to patient being in surgical suite all day.     Objective: Filed Vitals:   05/04/16 1625 05/04/16 1630 05/04/16 1700 05/04/16 1800  BP: 200/80 153/73 163/79   Pulse: 79 74 64 61  Temp:      TempSrc:  Resp: 20 20 13    Height: 6\' 2"  (1.88 m)     Weight: 66 kg (145 lb 8.1 oz)     SpO2: 100% 100% 100%     Intake/Output Summary (Last 24 hours) at 05/04/16 1905 Last data filed at 05/04/16 1750  Gross per 24 hour  Intake    975 ml  Output    885 ml  Net     90 ml   Filed Weights   05/03/16 1244 05/03/16 1650 05/04/16 1625  Weight: 70.7 kg (155 lb 13.8 oz) 68.8 kg (151 lb 10.8 oz) 66 kg (145 lb 8.1 oz)    Examination:  unable to evaluate secondary to patient being in surgical suite all day  .     Data Reviewed:  Care during the described time interval was provided by me .  I have reviewed this patient's available data, including medical history, events of note, physical examination, and all test results as part of my evaluation. I have personally reviewed and interpreted all radiology studies.  CBC:  Recent Labs Lab 05/02/16 1436 05/03/16 0627 05/04/16 1355  WBC 8.5 7.5 8.9  HGB 10.5* 10.7* 9.7*  HCT 34.0* 34.7* 32.3*  MCV 99.4 100.3* 99.7  PLT 259 250 [id number]   Basic Metabolic Panel:  Recent Labs Lab 05/02/16 1436 05/02/16 1930 05/03/16 0627 05/03/16 1811 05/04/16 0239 05/04/16 1355  NA 136  --  137 136 135 135  K 4.3  --  4.2 3.7 4.2 4.1  CL 95*  --  93* 96* 96* 99*  CO2 28  --  28 29 28 27   GLUCOSE 102*  --  133* 116* 123* 131*  BUN 41*  --  49* 10 16 19   CREATININE 7.95*  --  8.98* 3.30* 4.58* 5.09*  CALCIUM 8.5*  --  8.4* 8.7* 8.5* 8.0*  MG  --  2.0  --   --  2.0  --   PHOS  --   --   --  2.5  --   --    GFR: Estimated Creatinine Clearance: 11.7 mL/min (by C-G formula based on Cr of 5.09). Liver Function Tests:  Recent Labs Lab 05/02/16 1930 05/03/16 0627 05/03/16 1811  AST 14* 13*  --   ALT 12* 12*  --   ALKPHOS 111 125  --   BILITOT 0.6 0.6  --   PROT 6.0* 5.9*  --   ALBUMIN 3.3* 3.2* 3.2*   No results for input(s): LIPASE, AMYLASE in the last 168 hours. No results for input(s): AMMONIA in the last 168 hours. Coagulation Profile:  Recent Labs Lab 05/04/16 0239  INR 1.19   Cardiac Enzymes:  Recent Labs Lab 05/02/16 1930 05/03/16 0022 05/03/16 0554  TROPONINI <0.03 <0.03 <0.03   BNP (last 3 results) No results for input(s): PROBNP in the last 8760 hours. HbA1C:  Recent Labs  05/02/16 1930  HGBA1C 5.5   CBG:  Recent Labs Lab 05/02/16 1523 05/03/16 1654 05/04/16 0939  GLUCAP 116* 123* 83   Lipid Profile: No results for input(s): CHOL, HDL, LDLCALC, TRIG, CHOLHDL, LDLDIRECT in the last 72 hours. Thyroid Function Tests:  Recent Labs   05/02/16 1930  TSH 1.405   Anemia Panel: No results for input(s): VITAMINB12, FOLATE, FERRITIN, TIBC, IRON, RETICCTPCT in the last 72 hours. Urine analysis:    Component Value Date/Time   COLORURINE RED* 04/22/2015 1952   APPEARANCEUR TURBID* 04/22/2015 1952   LABSPEC 1.016 04/22/2015 1952   PHURINE 7.0 04/22/2015 1952  GLUCOSEU NEGATIVE 04/22/2015 1952   HGBUR LARGE* 04/22/2015 [place] NEGATIVE 04/22/2015 [place] NEGATIVE 04/22/2015 1952   PROTEINUR >300* 04/22/2015 1952   UROBILINOGEN 1.0 04/22/2015 1952   NITRITE NEGATIVE 04/22/2015 1952   LEUKOCYTESUR LARGE* 04/22/2015 1952   Sepsis Labs: @LABRCNTIP (procalcitonin:4,lacticidven:4)  ) Recent Results (from the past 240 hour(s))  MRSA PCR Screening     Status: None   Collection Time: 05/03/16  1:15 AM  Result Value Ref Range Status   MRSA by PCR NEGATIVE NEGATIVE Final    Comment:        The GeneXpert MRSA Assay (FDA approved for NASAL specimens only), is one component of a comprehensive MRSA colonization surveillance program. It is not intended to diagnose MRSA infection nor to guide or monitor treatment for MRSA infections.          Radiology Studies: Ct Head Wo Contrast  05/03/2016  CLINICAL DATA:  Follow-up evaluation. EXAM: CT HEAD WITHOUT CONTRAST TECHNIQUE: Contiguous axial images were obtained from the base of the skull through the vertex without intravenous contrast. COMPARISON:  CT HEAD May 02, 2016 FINDINGS: INTRACRANIAL CONTENTS: Similar mixed density RIGHT holo hemispheric subdural hematoma measuring up to 2.7 cm in transaxial dimension. Subjacent sulcal effacement with 4 mm stable RIGHT to LEFT midline shift. Partial effacement of RIGHT lateral ventricle without hydrocephalus or entrapment. No intraparenchymal hemorrhage or acute large vascular territory infarcts. Patchy white matter hypodensities most compatible with chronic small vessel ischemic disease. Basal cisterns are  patent. Severe calcific atherosclerosis of the carotid siphons. ORBITS: The included ocular globes and orbital contents are non-suspicious. Status post bilateral ocular lens implants. SINUSES: The mastoid aircells and included paranasal sinuses are well-aerated. SKULL/SOFT TISSUES: No skull fracture. No significant soft tissue swelling. IMPRESSION: Stable large RIGHT mixed density subdural hematoma resulting in 4 mm RIGHT to LEFT midline shift. No ventricular entrapment. Electronically Signed   By: Elon Alas M.D.   On: 05/03/2016 05:15   Dg Chest Port 1 View  05/04/2016  CLINICAL DATA:  Central line placement EXAM: PORTABLE CHEST 1 VIEW COMPARISON:  04/22/2015 FINDINGS: Cardiomediastinal silhouette is stable. Status post median sternotomy. No acute infiltrate or pulmonary edema. Stable right basilar atelectasis or scarring. There is right IJ central line with tip in distal SVC. No pneumothorax. Question old fracture deformity of the left humeral neck. IMPRESSION: No infiltrate or pulmonary edema. Stable right basilar atelectasis or scarring. Right IJ central line in place. No pneumothorax. Electronically Signed   By: [name] M.D.   On: 05/04/2016 10:15        Scheduled Meds: . aspirin  81 mg Oral QHS  . calcium acetate  667 mg Oral TID WC  . ceFAZolin      . [START ON 05/05/2016]  ceFAZolin (ANCEF) IV  2 g Intravenous Q M,W,F-HD  . cycloSPORINE modified  50 mg Oral BID  . dexamethasone  6 mg Intravenous Q6H   Followed by  . [START ON 05/05/2016] dexamethasone  4 mg Intravenous Q6H   Followed by  . [START ON 05/06/2016] dexamethasone  4 mg Intravenous Q8H  . donepezil  10 mg Oral QHS  . doxercalciferol  2 mcg Intravenous Q T,Th,Sa-HD  . famotidine (PEPCID) IV  20 mg Intravenous Q12H  . ferric gluconate (FERRLECIT/NULECIT) IV  62.5 mg Intravenous Once  . [START ON 05/05/2016] FLUoxetine  20 mg Oral Q breakfast  . hydrALAZINE  50 mg Oral TID  . hydrALAZINE  75 mg Oral Q8H  .  labetalol       . levETIRAcetam  500 mg Intravenous Q12H  . levothyroxine  75 mcg Oral QAC breakfast  . metoprolol  5 mg Intravenous Q6H  . metoprolol tartrate  25 mg Oral BID  . multivitamin  1 tablet Oral q morning - 10a  . mycophenolate  250 mg Oral QHS  . mycophenolate  500 mg Oral q morning - 10a  . pantoprazole  40 mg Oral Daily  . pravastatin  10 mg Oral QHS  . sodium chloride flush  3 mL Intravenous Q12H  . sodium chloride flush  3 mL Intravenous Q12H  . [START ON 05/05/2016] vitamin B-12  1,000 mcg Oral Q breakfast   Continuous Infusions:    LOS: 2 days    Time spent: 40 minutes    Irianna Gilday, Geraldo Docker, MD [facility] Pager [phone]   If 7PM-7AM, please contact night-coverage [email or web address] Password [email or web address] 05/04/2016, 7:05 PM

## 2016-05-04 NOTE — Transfer of Care (Signed)
Immediate Anesthesia Transfer of Care Note  Patient: [name] [name]  Procedure(s) Performed: Procedure(s): Right Craniotomy for Evacuation of Subdural Hematoma (Right)  Patient Location: PACU  Anesthesia Type:General  Level of Consciousness: awake, alert  and oriented  Airway & Oxygen Therapy: Patient Spontanous Breathing and Patient connected to nasal cannula oxygen  Post-op Assessment: Report given to RN, Post -op Vital signs reviewed and stable and Patient moving all extremities X 4  Post vital signs: Reviewed and stable  Last Vitals:  Filed Vitals:   05/04/16 0359 05/04/16 0936  BP: 142/53 197/65  Pulse: 59 68  Temp:  36.3 C  Resp: 9 10    Last Pain:  Filed Vitals:   05/04/16 0943  PainSc: Asleep      Patients Stated Pain Goal: 2 ([phone])  Complications: No apparent anesthesia complications

## 2016-05-04 NOTE — Progress Notes (Deleted)
[place] Progress Note Patient Name: [name] [name] DOB: 01/22/1941 MRN: [id number]  Entered in error

## 2016-05-04 NOTE — Progress Notes (Signed)
Family in at bedside. Placed rt and lt hearing aids for pt. 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

## 2016-05-04 NOTE — Progress Notes (Signed)
Foley catheter discontinued per order of Juanell Fairly PA renal.

## 2016-05-04 NOTE — Brief Op Note (Signed)
05/02/2016 - 05/04/2016  9:16 AM  PATIENT:  [name] [name]  75 y.o. male  PRE-OPERATIVE DIAGNOSIS:  Subdural Hematoma  POST-OPERATIVE DIAGNOSIS:  Subdural Hematoma  PROCEDURE:  Procedure(s): Right Craniotomy for Evacuation of Subdural Hematoma (Right)  SURGEON:  Surgeon(s) and Role:    * Earnie Larsson, MD - Primary    * Kevan Ny Ditty, MD - Assisting  PHYSICIAN ASSISTANT:   ASSISTANTS:    ANESTHESIA:   general  EBL:  Total I/O In: 400 [I.V.:400] Out: -   BLOOD ADMINISTERED:none  DRAINS: (10 mm) Blake drain(s) in the subdural space   LOCAL MEDICATIONS USED:  MARCAINE     SPECIMEN:  No Specimen  DISPOSITION OF SPECIMEN:  N/A  COUNTS:  YES  TOURNIQUET:  * No tourniquets in log *  DICTATION: .Dragon Dictation  PLAN OF CARE: Admit to inpatient   PATIENT DISPOSITION:  PACU - hemodynamically stable.   Delay start of Pharmacological VTE agent (>24hrs) due to surgical blood loss or risk of bleeding: yes

## 2016-05-04 NOTE — Interval H&P Note (Signed)
History and Physical Interval Note:  05/04/2016 7:32 AM  [name] [name]  has presented today for surgery, with the diagnosis of Subdural Hematoma  The various methods of treatment have been discussed with the patient and family. After consideration of risks, benefits and other options for treatment, the patient has consented to  Procedure(s): CRANIOTOMY HEMATOMA EVACUATION SUBDURAL (Right) as a surgical intervention .  The patient's history has been reviewed, patient examined, no change in status, stable for surgery.  I have reviewed the patient's chart and labs.  Questions were answered to the patient's satisfaction.     Kacie Huxtable A

## 2016-05-04 NOTE — Progress Notes (Signed)
[facility] Progress Note   Subjective:  "I'm doing very well, thank you!" Seen in PACU post op R craniotomy. Alert, follows commands. Still C/O headache, other calm, NAD.   Objective Filed Vitals:   05/04/16 1430 05/04/16 1445 05/04/16 1500 05/04/16 1530  BP: 162/66 155/94 153/74   Pulse: 71 74 69 72  Temp:  97.8 F (36.6 C)    TempSrc:      Resp: 13 23 15 22   Height:      Weight:      SpO2: 100% 100% 100% 100%   Physical Exam General: Awake, cooperative, NAD Heart: S1, S2, RRR Lungs: BBS sl dec in bases CTA Abdomen: hypoactive BS, non-distended Extremities: No LE edema Dialysis Access: LUA AVF + bruit  Dialysis Orders: [facility] TTS 4 hours 400/auto 1.5 EDW 69.5 kg 2.0K/2.25 Ca UF profile 2 No Heparin Hectorol 2 mcg IV q TTS (last PTH 297 03/16/16) Mircera 100 mcg IV Q 2 weeks (Last dose 04/27/16 HGB 11.0) Venofer 50 mg IV (last dose 04/29/16 Fe 83 Tsat 33% 04/13/16)  Additional Objective Labs: Basic Metabolic Panel:  Recent Labs Lab 05/03/16 1811 05/04/16 0239 05/04/16 1355  NA 136 135 135  K 3.7 4.2 4.1  CL 96* 96* 99*  CO2 29 28 27   GLUCOSE 116* 123* 131*  BUN 10 16 19   CREATININE 3.30* 4.58* 5.09*  CALCIUM 8.7* 8.5* 8.0*  PHOS 2.5  --   --    Liver Function Tests:  Recent Labs Lab 05/02/16 1930 05/03/16 0627 05/03/16 1811  AST 14* 13*  --   ALT 12* 12*  --   ALKPHOS 111 125  --   BILITOT 0.6 0.6  --   PROT 6.0* 5.9*  --   ALBUMIN 3.3* 3.2* 3.2*   CBC:  Recent Labs Lab 05/02/16 1436 05/03/16 0627 05/04/16 1355  WBC 8.5 7.5 8.9  HGB 10.5* 10.7* 9.7*  HCT 34.0* 34.7* 32.3*  MCV 99.4 100.3* 99.7  PLT 259 250 207   Blood Culture    Component Value Date/Time   SDES URINE, CLEAN CATCH 04/22/2015 1950   SPECREQUEST NONE 04/22/2015 1950   CULT  04/22/2015 1950    ESCHERICHIA COLI Performed at [facility] 04/25/2015 FINAL 04/22/2015 1950    Cardiac Enzymes:  Recent Labs Lab 05/02/16 1930  05/03/16 0022 05/03/16 0554  TROPONINI <0.03 <0.03 <0.03   CBG:  Recent Labs Lab 05/02/16 1523 05/03/16 1654 05/04/16 0939  GLUCAP 116* 123* 83    Studies/Results: Ct Head Wo Contrast  05/03/2016  CLINICAL DATA:  Follow-up evaluation. EXAM: CT HEAD WITHOUT CONTRAST TECHNIQUE: Contiguous axial images were obtained from the base of the skull through the vertex without intravenous contrast. COMPARISON:  CT HEAD May 02, 2016 FINDINGS: INTRACRANIAL CONTENTS: Similar mixed density RIGHT holo hemispheric subdural hematoma measuring up to 2.7 cm in transaxial dimension. Subjacent sulcal effacement with 4 mm stable RIGHT to LEFT midline shift. Partial effacement of RIGHT lateral ventricle without hydrocephalus or entrapment. No intraparenchymal hemorrhage or acute large vascular territory infarcts. Patchy white matter hypodensities most compatible with chronic small vessel ischemic disease. Basal cisterns are patent. Severe calcific atherosclerosis of the carotid siphons. ORBITS: The included ocular globes and orbital contents are non-suspicious. Status post bilateral ocular lens implants. SINUSES: The mastoid aircells and included paranasal sinuses are well-aerated. SKULL/SOFT TISSUES: No skull fracture. No significant soft tissue swelling. IMPRESSION: Stable large RIGHT mixed density subdural hematoma resulting in 4 mm RIGHT to LEFT midline  shift. No ventricular entrapment. Electronically Signed   By: Elon Alas M.D.   On: 05/03/2016 05:15   Ct Head Wo Contrast  05/02/2016  CLINICAL DATA:  Fall today EXAM: CT HEAD WITHOUT CONTRAST TECHNIQUE: Contiguous axial images were obtained from the base of the skull through the vertex without intravenous contrast. COMPARISON:  CT head 02/24/2016 FINDINGS: Large subdural fluid collection on the right. This is predominantly low density but has scattered areas of increased density compatible with acute and chronic subdural hematoma. This measures up to 27 mm  in thickness. There is mass-effect on the right hemisphere without edema. 5 mm midline shift to the left. Generalized atrophy.  Negative for acute infarct. Negative for skull fracture. IMPRESSION: Large mixed density subdural hematoma on the right compatible with acute and chronic subdural hematoma. This was not present on the prior CT. 5 mm midline shift to the left. These results were called by telephone at the time of interpretation on 05/02/2016 at 3:45 Pm to [facility] , who verbally acknowledged these results. Electronically Signed   By: Franchot Gallo M.D.   On: 05/02/2016 16:06   Dg Chest Port 1 View  05/04/2016  CLINICAL DATA:  Central line placement EXAM: PORTABLE CHEST 1 VIEW COMPARISON:  04/22/2015 FINDINGS: Cardiomediastinal silhouette is stable. Status post median sternotomy. No acute infiltrate or pulmonary edema. Stable right basilar atelectasis or scarring. There is right IJ central line with tip in distal SVC. No pneumothorax. Question old fracture deformity of the left humeral neck. IMPRESSION: No infiltrate or pulmonary edema. Stable right basilar atelectasis or scarring. Right IJ central line in place. No pneumothorax. Electronically Signed   By: [name] M.D.   On: 05/04/2016 10:15   Medications:   . [MAR Hold] calcium acetate  667 mg Oral TID WC  . ceFAZolin      . [START ON 05/05/2016]  ceFAZolin (ANCEF) IV  2 g Intravenous Q24H  . [MAR Hold] cycloSPORINE modified  50 mg Oral BID  . [MAR Hold] donepezil  10 mg Oral QHS  . [MAR Hold] doxercalciferol  2 mcg Intravenous Q T,Th,Sa-HD  . [MAR Hold] ferric gluconate (FERRLECIT/NULECIT) IV  62.5 mg Intravenous Once  . [MAR Hold] hydrALAZINE  75 mg Oral Q8H  . labetalol      . [MAR Hold] levothyroxine  75 mcg Oral QAC breakfast  . [MAR Hold] metoprolol tartrate  25 mg Oral BID  . [MAR Hold] multivitamin  1 tablet Oral q morning - 10a  . [MAR Hold] mycophenolate  250 mg Oral QHS  . [MAR Hold] mycophenolate  500 mg Oral q  morning - 10a  . [MAR Hold] pantoprazole  40 mg Oral Daily  . [MAR Hold] pravastatin  10 mg Oral QHS  . [MAR Hold] sodium chloride flush  3 mL Intravenous Q12H  . [MAR Hold] sodium chloride flush  3 mL Intravenous Q12H    Assessment/Plan: 1. R. Subdural Hematoma/S/P R Craniotomy: Per primary/Neurosurgery. Awake and stable post op.  Post OP Ancef changed to Ancef 2 G q 24 hours X 2-last dose at end of HD tomorrow.  2.  ESRD - TTS. HD off schedule tomorrow, short HD Saturday to get back on schedule.  3. Hypertension/volume - Has been having orthostatic hypotension at home. Consider etiologies such as autonomic dysfunction post cardiac transplant, volume depletion or antihypertensives medications.  On metoprolol and hydralazine, PRN labetolol. Had HD 05/03/16 Pre wt 70.7 Net UF 1998 Post wt 68.8. Under EDW. Run even tomorrow.  Reassess for Saturday tx.  4. Anemia - HGB 9.7 Follow HGB  recent dose ESA. Cont weekly fe.  5. Metabolic bone disease - Ca 8.0 C Ca 8.6 Phos 2.5 hold binders/cont VDRA. 2.5 Ca bath.  6. Nutrition -Albumin 3.2 change to renal diet, add renal vit/prostat 7. Immunosuppressant therapy S/P cardiac transplant: per primary cont cyclosporine, mycophenolate  Rita H. Brown NP-C 05/04/2016, 3:37 PM  [facility]  Pt seen, examined and agree w A/P as above.  [name] MD [employer] pager [phone]    cell [phone] 05/05/2016, 8:54 AM

## 2016-05-05 ENCOUNTER — Inpatient Hospital Stay (HOSPITAL_COMMUNITY): Payer: Medicare HMO

## 2016-05-05 ENCOUNTER — Encounter (HOSPITAL_COMMUNITY): Payer: Self-pay | Admitting: Neurosurgery

## 2016-05-05 DIAGNOSIS — I10 Essential (primary) hypertension: Secondary | ICD-10-CM

## 2016-05-05 DIAGNOSIS — F0280 Dementia in other diseases classified elsewhere without behavioral disturbance: Secondary | ICD-10-CM | POA: Diagnosis present

## 2016-05-05 DIAGNOSIS — I6789 Other cerebrovascular disease: Secondary | ICD-10-CM

## 2016-05-05 DIAGNOSIS — E118 Type 2 diabetes mellitus with unspecified complications: Secondary | ICD-10-CM | POA: Diagnosis present

## 2016-05-05 LAB — BASIC METABOLIC PANEL
Anion gap: 13 (ref 5–15)
BUN: 29 mg/dL — AB (ref 6–20)
CHLORIDE: 97 mmol/L — AB (ref 101–111)
CO2: 23 mmol/L (ref 22–32)
CREATININE: 6.1 mg/dL — AB (ref 0.61–1.24)
Calcium: 8.3 mg/dL — ABNORMAL LOW (ref 8.9–10.3)
GFR, EST AFRICAN AMERICAN: 9 mL/min — AB (ref >60)
GFR, EST NON AFRICAN AMERICAN: 8 mL/min — AB (ref >60)
Glucose, Bld: 215 mg/dL — ABNORMAL HIGH (ref 65–99)
POTASSIUM: 4.5 mmol/L (ref 3.5–5.1)
SODIUM: 133 mmol/L — AB (ref 135–145)

## 2016-05-05 LAB — ECHOCARDIOGRAM COMPLETE
CHL CUP MV DEC (S): 193
E decel time: 193 msec
E/e' ratio: 9.27
FS: 30 % (ref 28–44)
HEIGHTINCHES: 74 in
IV/PV OW: 0.92
LA vol: 70.9 mL
LADIAMINDEX: 2.43 cm/m2
LASIZE: 47 mm
LAVOLA4C: 57.8 mL
LAVOLIN: 36.6 mL/m2
LDCA: 3.8 cm2
LEFT ATRIUM END SYS DIAM: 47 mm
LV E/e' medial: 9.27
LVEEAVG: 9.27
LVELAT: 12.3 cm/s
LVOTD: 22 mm
Lateral S' vel: 14.1 cm/s
MV Peak grad: 5 mmHg
MV pk A vel: 77.6 m/s
MVPKEVEL: 114 m/s
PW: 14.2 mm — AB (ref 0.6–1.1)
TAPSE: 23.5 mm
TDI e' lateral: 12.3
TDI e' medial: 9.25
WEIGHTICAEL: 2560.86 [oz_av]

## 2016-05-05 LAB — CBC
HEMATOCRIT: 31.6 % — AB (ref 39.0–52.0)
Hemoglobin: 9.4 g/dL — ABNORMAL LOW (ref 13.0–17.0)
MCH: 29.5 pg (ref 26.0–34.0)
MCHC: 29.7 g/dL — AB (ref 30.0–36.0)
MCV: 99.1 fL (ref 78.0–100.0)
Platelets: 225 10*3/uL (ref 150–400)
RBC: 3.19 MIL/uL — AB (ref 4.22–5.81)
RDW: 16.3 % — ABNORMAL HIGH (ref 11.5–15.5)
WBC: 8 10*3/uL (ref 4.0–10.5)

## 2016-05-05 MED ORDER — DOXERCALCIFEROL 4 MCG/2ML IV SOLN
INTRAVENOUS | Status: AC
  Filled 2016-05-05: qty 2

## 2016-05-05 MED ORDER — HYDROCODONE-ACETAMINOPHEN 5-325 MG PO TABS
ORAL_TABLET | ORAL | Status: AC
  Filled 2016-05-05: qty 1

## 2016-05-05 MED ORDER — PRO-STAT SUGAR FREE PO LIQD
30.0000 mL | Freq: Two times a day (BID) | ORAL | Status: DC
  Administered 2016-05-06 – 2016-05-10 (×7): 30 mL via ORAL
  Filled 2016-05-05 (×9): qty 30

## 2016-05-05 NOTE — Progress Notes (Signed)
Rehab Admissions Coordinator Note:  Patient was screened by Cleatrice Burke for appropriateness for an Inpatient Acute Rehab Consult per PT recommendation.  At this time, we are recommending Inpatient Rehab consult. Please place order when appropriate.  Cleatrice Burke 05/05/2016, 4:34 PM  I can be reached at [phone].

## 2016-05-05 NOTE — Progress Notes (Signed)
Patient becoming more confused and agitated, continually attempting to get out of bed and wanting to go home. Patient states, "i'm worried, I don't feel of sound mind."  Wife states patient gets more confused and agitation following dialysis.  Dr. Arnoldo Morale notified, will continue to monitor.

## 2016-05-05 NOTE — Progress Notes (Signed)
  Echocardiogram 2D Echocardiogram has been performed.  [name] [name] 05/05/2016, 3:01 PM

## 2016-05-05 NOTE — Progress Notes (Signed)
Just came back from dialysis Feels well Awake and alert Oriented Moving arms and legs well CT looks good D/c drain Observe in ICU for one more night

## 2016-05-05 NOTE — Evaluation (Signed)
Occupational Therapy Evaluation Patient Details Name: [name] [name] MRN: [id number] DOB: February 13, 1941 Today's Date: 05/05/2016    History of Present Illness 75 y.o. 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  Who presents with increased weakness and a fall with resultant SDH. Patient S/P Right frontal parietal craniotomy with evacuation of subdural hematoma. Placement of subdural drain by Neurosurgery.Niel Hummer removed 6/16.   Clinical Impression   Pt reports he required min assist with LB dressing and tub transfers PTA was otherwise independent. Pt currently requires min guard for seated ADLs and min assist for LB ADLs in standing. He required mod assist +2 for stand pivot transfer from EOB to chair. Pt impulsive, confused, and inconsistently following commands with an extensive history of falls over the past year. At this time recommending CIR for follow up to maximize independence and safety with ADLs and functional mobility prior to return home. Pt would benefit from continued skilled OT to address established goals.    Follow Up Recommendations  CIR;Supervision/Assistance - 24 hour    Equipment Recommendations  Other (comment) (TBD)    Recommendations for Other Services       Precautions / Restrictions Precautions Precautions: Fall Restrictions Weight Bearing Restrictions: No      Mobility Bed Mobility Overal bed mobility: Needs Assistance Bed Mobility: Supine to Sit     Supine to sit: Min assist     General bed mobility comments: Min assist for safety and initiation of bed mobility, Cues for posture and positioning upon coming to EOB  Transfers Overall transfer level: Needs assistance Equipment used: 2 person hand held assist Transfers: Sit to/from [employer] Sit to Stand: Mod assist;+2 physical assistance Stand pivot transfers: Mod assist;+2 physical assistance            Balance Overall balance assessment: History of Falls (several admissions for multiple falls in past year)                                          ADL Overall ADL's : Needs assistance/impaired Eating/Feeding: Set up;Sitting   Grooming: Min guard;Sitting   Upper Body Bathing: Min guard;Sitting   Lower Body Bathing: Minimal assistance;Sit to/from stand   Upper Body Dressing : Min guard;Sitting Upper Body Dressing Details (indicate cue type and reason): to don hospital gown Lower Body Dressing: Minimal assistance;Sit to/from stand Lower Body Dressing Details (indicate cue type and reason): Pt able to pull up socks sitting EOB with min guard assist but anticipate he would require assist to pull up pants/underwear once in standing. Toilet Transfer: Moderate assistance;+2 for physical assistance;Stand-pivot;BSC Toilet Transfer Details (indicate cue type and reason): Simulated by stand pivot from EOB to chair Toileting- Clothing Manipulation and Hygiene: Moderate assistance;Sit to/from stand       Functional mobility during ADLs: Moderate assistance;+2 for physical assistance (for stand pivot only) General ADL Comments: Pt and wife report extensive hx of falls at home.     Vision Additional Comments: appears WFL; to be further assessed   Perception     Praxis      Pertinent Vitals/Pain Pain Assessment: No/denies pain     Hand Dominance Right   Extremity/Trunk Assessment Upper Extremity Assessment Upper Extremity Assessment: Generalized  weakness   Lower Extremity Assessment Lower Extremity Assessment: Defer to PT evaluation   Cervical / Trunk Assessment Cervical / Trunk Assessment: Kyphotic   Communication Communication Communication: HOH   Cognition Arousal/Alertness: Awake/alert Behavior During Therapy: WFL for tasks  assessed/performed Overall Cognitive Status: Impaired/Different from baseline Area of Impairment: Attention;Following commands;Safety/judgement;Awareness;Problem solving   Current Attention Level: Sustained Memory: Decreased short-term memory Following Commands: Follows one step commands with increased time Safety/Judgement: Decreased awareness of safety;Decreased awareness of deficits Awareness: Intellectual Problem Solving: Slow processing;Decreased initiation;Difficulty sequencing;Requires verbal cues;Requires tactile cues     General Comments       Exercises       Shoulder Instructions      Home Living Family/patient expects to be discharged to:: Private residence Living Arrangements: Spouse/significant other Available Help at Discharge: Family;Available 24 hours/day (son lives in same complex) Type of Home: Apartment Home Access: Stairs to enter [employer] of Steps: 3 Entrance Stairs-Rails: Right Home Layout: One level     Bathroom Shower/Tub: Teacher, early years/pre: Standard     Home Equipment: Environmental consultant - 4 wheels;Cane - single point;Bedside commode;Shower seat;Toilet riser;Grab bars - tub/shower;Transport chair          Prior Functioning/Environment Level of Independence: Needs assistance  Gait / Transfers Assistance Needed: uses rollator at baseline, and on dialysis days uses w/c          OT Diagnosis: Generalized weakness;Cognitive deficits;Altered mental status   OT Problem List: Decreased strength;Decreased activity tolerance;Impaired balance (sitting and/or standing);Decreased cognition;Decreased safety awareness;Decreased knowledge of use of DME or AE;Decreased knowledge of precautions   OT Treatment/Interventions: Self-care/ADL training;Energy conservation;DME and/or AE instruction;Therapeutic activities;Cognitive remediation/compensation;Patient/family education;Balance training    OT Goals(Current goals can be found in the  care plan section) Acute Rehab OT Goals Patient Stated Goal: to go home OT Goal Formulation: With patient Time For Goal Achievement: 05/19/16 Potential to Achieve Goals: Fair ADL Goals Pt Will Perform Grooming: with supervision;standing Pt Will Perform Upper Body Bathing: with supervision;sitting Pt Will Perform Lower Body Bathing: with supervision;sit to/from stand Pt Will Transfer to Toilet: with supervision;ambulating;bedside commode Pt Will Perform Toileting - Clothing Manipulation and hygiene: with supervision;sit to/from stand  OT Frequency: Min 2X/week   Barriers to D/C:            Co-evaluation PT/OT/SLP Co-Evaluation/Treatment: Yes Reason for Co-Treatment: Complexity of the patient's impairments (multi-system involvement);For patient/therapist safety   OT goals addressed during session: Other (comment) (functional mobility)      End of Session Equipment Utilized During Treatment: Gait belt Nurse Communication: Mobility status  Activity Tolerance: Patient tolerated treatment well Patient left: in chair;with call bell/phone within reach;with chair alarm set;with family/visitor present   Time: [id number] OT Time Calculation (min): 18 min Charges:  OT General Charges $OT Visit: 1 Procedure OT Evaluation $OT Eval Moderate Complexity: 1 Procedure G-Codes:     Binnie Kand M.S., OTR/L Pager: [phone]  05/05/2016, 4:32 PM

## 2016-05-05 NOTE — Evaluation (Signed)
Physical Therapy Evaluation Patient Details Name: [name] [name] MRN: [id number] DOB: 1940/12/22 Today's Date: 05/05/2016   History of Present Illness  75 y.o. 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  Who presents with increased weakness and a fall with resultant SDH. Patient S/P Right frontal parietal craniotomy with evacuation of subdural hematoma. Placement of subdural drain by Neurosurgery.Niel Hummer removed 6/16.  Clinical Impression  Patient demonstrates deficits in functional mobility as indicated below. Will need continued skilled PT to address deficits and maximize function. Will see as indicated and progress as tolerated. OF NOTE, patient with significant fall history progressively worsening with this fall resulting in SDH. Concerned regarding patient safety and ability of caregiver-wife to provide adequate assist as she herself uses a rollator for mobility.  At this time, recommend continued post acute rehabilitation.    Follow Up Recommendations CIR;Supervision/Assistance - 24 hour    Equipment Recommendations  None recommended by PT    Recommendations for Other Services Rehab consult     Precautions / Restrictions Precautions Precautions: Fall Restrictions Weight Bearing Restrictions: No      Mobility  Bed Mobility Overal bed mobility: Needs Assistance Bed Mobility: Supine to Sit     Supine to sit: Min assist     General bed mobility comments: Min assist for safety and initiation of bed mobility, Cues for posture and positioning upon coming to EOB  Transfers Overall transfer level: Needs assistance Equipment used: 2 person hand held assist Transfers: Sit to/from [facility] Sit to Stand: Mod assist;+2 physical assistance Stand pivot transfers:  Mod assist;+2 physical assistance          Ambulation/Gait             General Gait Details: not safe to perform  Stairs            Wheelchair Mobility    Modified Rankin (Stroke Patients Only) Modified Rankin (Stroke Patients Only) Pre-Morbid Rankin Score: No symptoms Modified Rankin: Severe disability     Balance Overall balance assessment: History of Falls (several admissions for multiple falls in past year)                                           Pertinent Vitals/Pain Pain Assessment: No/denies pain    Home Living Family/patient expects to be discharged to:: Private residence Living Arrangements: Spouse/significant other Available Help at Discharge: Family;Available 24 hours/day (son lives in same complex) Type of Home: Apartment Home Access: Stairs to enter Entrance Stairs-Rails: Right Entrance Stairs-Number of Steps: 3 Home Layout: One level Home Equipment: Walker - 4 wheels;Cane - single point;Bedside commode;Shower seat;Toilet riser;Grab bars - tub/shower;Transport chair      Prior Function Level of Independence: Needs assistance   Gait / Transfers Assistance Needed: uses rollator at baseline, and on dialysis days uses w/c           Hand Dominance   Dominant Hand: Right    Extremity/Trunk Assessment               Lower Extremity Assessment: Generalized weakness (impaired coordination)         Communication   Communication: HOH  Cognition Arousal/Alertness: Awake/alert Behavior During Therapy: WFL for tasks assessed/performed Overall Cognitive  Status: Impaired/Different from baseline Area of Impairment: Attention;Following commands;Safety/judgement;Awareness;Problem solving   Current Attention Level: Sustained Memory: Decreased short-term memory Following Commands: Follows one step commands with increased time Safety/Judgement: Decreased awareness of safety;Decreased awareness of deficits Awareness:  Intellectual Problem Solving: Slow processing;Decreased initiation;Difficulty sequencing;Requires verbal cues;Requires tactile cues      General Comments      Exercises        Assessment/Plan    PT Assessment Patient needs continued PT services  PT Diagnosis Difficulty walking;Abnormality of gait;Generalized weakness;Altered mental status   PT Problem List Decreased strength;Decreased activity tolerance;Decreased balance;Decreased mobility;Decreased coordination;Decreased cognition;Decreased safety awareness  PT Treatment Interventions DME instruction;Gait training;Stair training;Functional mobility training;Therapeutic activities;Therapeutic exercise;Balance training;Neuromuscular re-education;Patient/family education   PT Goals (Current goals can be found in the Care Plan section) Acute Rehab PT Goals Patient Stated Goal: to go home PT Goal Formulation: With patient Time For Goal Achievement: 05/19/16 Potential to Achieve Goals: Fair    Frequency Min 4X/week   Barriers to discharge        Co-evaluation               End of Session Equipment Utilized During Treatment: Gait belt Activity Tolerance: Patient tolerated treatment well;Patient limited by fatigue Patient left: in chair;with call bell/phone within reach;with chair alarm set;with family/visitor present Nurse Communication: Mobility status;Precautions         Time: [id number] PT Time Calculation (min) (ACUTE ONLY): 21 min   Charges:   PT Evaluation $PT Eval Moderate Complexity: 1 Procedure     PT G CodesDuncan Dull 06-03-16, 4:22 PM Alben Deeds, [place] DPT  [phone]

## 2016-05-05 NOTE — Consult Note (Signed)
Medical Consultation   [name] [name]  [id number]  DOB: Jan 13, 1941  DOA: 05/02/2016  PCP: Leamon Arnt, MD    Requesting physician: [facility]  Reason for consultation: Multiple medical problems   History of Present Illness: [name] [name] is an 75 y.o. 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 Patient states that he has become progressively more weak over the last 6 weeks , has had 3 falls in the last week. 2 falls on Friday and one fall today. He states that he's had small subdural hematomas since April 2015. Patient states today he was using a rolling walker , lost his balance and fell. He denies any preceding focal symptoms, in the preceding week. Patient was recently seen at [facility] by his cardiologist and his hydralazine dose was increased. He also had a recent 2-D echo done on 04/18/16 was found to have an EF of 60-65% without any segmental wall motion abnormalities. He denies any history of A. fib.  Patient states he has been going to physical therapy for the last 5 weeks without improvement in his symptoms. Patient states he has issues with orthostatic hypotension and gets very dizzy upon standing which she states is typically why he falls down. He does have weakness in his left leg which is been ongoing for approximately one year. He states he often drags it when walking. His hydralazine was recently increased to 75 mg 3 times a day. Marland Kitchen He denies chest pain or shortness of breath. Patient also complains of feeling somewhat dizzy and lightheaded due to poor oral intake over the last couple of days but denies any fever chills,rigors.  6/15 Patient S/P Right  frontal parietal craniotomy with evacuation of subdural hematoma. Placement of subdural drain by Neurosurgery.      Review of Systems:  Review of Systems  Constitutional: Negative for fever, chills and malaise/fatigue.  HENT: Negative for ear discharge, ear pain, hearing loss, sore throat and tinnitus.   Eyes: Positive for blurred vision. Negative for double vision, photophobia and pain.  Respiratory: Negative for cough and wheezing.   Cardiovascular: Negative for chest pain and leg swelling.  Gastrointestinal: Negative for abdominal pain.  Musculoskeletal: Negative for neck pain.  Skin: Negative for itching and rash.  Neurological: Positive for headaches. Negative for dizziness, sensory change, speech change, focal weakness and weakness.  Psychiatric/Behavioral: Negative for depression, hallucinations and memory loss.     Past Medical History: Past Medical History  Diagnosis Date  . CKD III-IV     a. since transplant in 2001 with significant worsening since 06/2013.  Marland Kitchen Hypertension     a. Long h/o HTN, came off of all meds following transplant/wt loss-->meds resumed 06/2013, difficult to control since.  Marland Kitchen History of stroke   . Hypercalcemia     a. 06/2013 Hospitalized in [place].  Marland Kitchen Heart transplanted ([place])     a. 1995 CABG x 4 in [place], [place];  b. 2001 Developed CHF;  c. 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 10/2000 Cardiac transplant @ [facility], [place];  e. Historically nl Bx and stress testing.  Last stress test ~ 5 yrs ago.  Marland Kitchen Hernia of  abdominal wall   . Hyperlipidemia   . Orthostatic hypotension     a. Has tried support hose and compression stockings - prefers not to wear.  . Coronary artery disease   . Myocardial infarction ([place])     PRIOR TO HEART TRANSPLANT  . Shortness of breath     EXERTION  . Orthostatic dizziness   . Stroke ([place]) 06/2013    NO RESIDUAL - WAS CONFUSED AT TIME - THAT HAS RESOLVED  . Diabetes mellitus without complication (HCC)     FASTING CBG 90S  .  Headache(784.0)   . Cancer ([place])     SKIN  . Anemia   . History of blood transfusion   . Dementia     mild    Past Surgical History: Past Surgical History  Procedure Laterality Date  . Cardiac surgery    . Heart transplant    . Cataract extraction    . Vasectomy    . Skin graft full thickness leg      cat scratch that did not heal  . Coronary artery bypass graft    . Eye surgery Bilateral     CATARACTS  . Bascilic vein transposition Left 07/06/2014    Procedure: [place];  Surgeon: Rosetta Posner, MD;  Location: [place];  Service: Vascular;  Laterality: Left;     Allergies:   Allergies  Allergen Reactions  . Amlodipine Swelling    Swelling of the lower body    . Norvasc [Amlodipine Besylate] Swelling and Other (See Comments)    Swelling of the lower body   . Ace Inhibitors Other (See Comments)    Dialysis  . Angiotensin Receptor Blockers Other (See Comments)    Dialysis     Social History:  reports that he has never smoked. He has never used smokeless tobacco. He reports that he does not drink alcohol or use illicit drugs.   Family History: Family History  Problem Relation Age of Onset  . Heart attack Father     died @ 50  . Diabetes Father   . Heart attack Mother   . Diabetes Mother   . Kidney disease Maternal Aunt       Procedures/Significant Events:  6/13 CT head WO contrast;-Large mixed density subdural hematoma on the right compatible with acute on chronic SDH. Present on prior CT. 5 mm midline shift to the left. 6/14 CT head WO contrast;-Stable large RIGHT mixed density SDH resulting in 4 mm RIGHT to LEFT midline shift. No ventricular entrapment 6/15 Right frontal parietal craniotomy with evacuation of subdural hematoma. Placement of subdural drain  Cultures 6/14 MRSA by PCR negative  Antimicrobials: Ancef 6/15>>   Devices    LINES / TUBES:  Right IJ 6/15>> Right radial art line 6/15>> Left upper arm AV  fistula>>    Continuous Infusions:    Physical Exam: Filed Vitals:   05/05/16 0300 05/05/16 0400 05/05/16 0500 05/05/16 0600  BP: 153/47 154/131 163/78 147/62  Pulse:   81 87  Temp:  98.1 F (36.7 C)    TempSrc:  Oral    Resp: 23 26 17 18   Height:      Weight:      SpO2: 98%  100% 100%    General: A/O 4, NAD, dressing in place right occipital region of skull, No acute respiratory distress Eyes: negative scleral hemorrhage, negative anisocoria, negative icterus ENT: Negative Runny nose, negative gingival bleeding, Neck:  Negative scars, masses, torticollis, lymphadenopathy, JVD, right IJ  in place cleaning covered, negative sign of infection Lungs: Clear to auscultation bilaterally without wheezes or crackles Cardiovascular: Tachycardic, Regular rhythm without murmur gallop or rub normal S1 and S2 Abdomen: negative abdominal pain, nondistended, positive soft, bowel sounds, no rebound, no ascites, no appreciable mass Extremities: No significant cyanosis, clubbing, or edema bilateral lower extremities Skin: Negative rashes, lesions, ulcers, incision site covered and clean did not take down dressing Psychiatric:  Negative depression, negative anxiety, negative fatigue, negative mania  Central nervous system:  Cranial nerves II through XII intact, tongue/uvula midline, all extremities muscle strength 5/5, sensation intact throughout, finger nose finger right hand  within normal limits, quick finger touch right within normal limits, negative dysarthria, negative expressive aphasia, negative receptive aphasia.  Data reviewed:  I have personally reviewed following labs and imaging studies Labs:  CBC:  Recent Labs Lab 05/02/16 1436 05/03/16 0627 05/04/16 1355 05/04/16 1930 05/05/16 0630  WBC 8.5 7.5 8.9 9.2 8.0  HGB 10.5* 10.7* 9.7* 9.5* 9.4*  HCT 34.0* 34.7* 32.3* 31.5* 31.6*  MCV 99.4 100.3* 99.7 99.1 99.1  PLT 259 250 207 221 [id number]    Basic Metabolic Panel:  Recent  Labs Lab 05/02/16 1930  05/03/16 1811 05/04/16 0239 05/04/16 1355 05/04/16 1931 05/05/16 0630  NA  --   < > 136 135 135 133* 133*  K  --   < > 3.7 4.2 4.1 4.3 4.5  CL  --   < > 96* 96* 99* 97* 97*  CO2  --   < > 29 28 27 23 23   GLUCOSE  --   < > 116* 123* 131* 149* 215*  BUN  --   < > 10 16 19  22* 29*  CREATININE  --   < > 3.30* 4.58* 5.09* 5.51* 6.10*  CALCIUM  --   < > 8.7* 8.5* 8.0* 8.3* 8.3*  MG 2.0  --   --  2.0  --   --   --   PHOS  --   --  2.5  --   --  4.6  --   < > = values in this interval not displayed. GFR Estimated Creatinine Clearance: 9.8 mL/min (by C-G formula based on Cr of 6.1). Liver Function Tests:  Recent Labs Lab 05/02/16 1930 05/03/16 0627 05/03/16 1811 05/04/16 1931  AST 14* 13*  --   --   ALT 12* 12*  --   --   ALKPHOS 111 125  --   --   BILITOT 0.6 0.6  --   --   PROT 6.0* 5.9*  --   --   ALBUMIN 3.3* 3.2* 3.2* 2.9*   No results for input(s): LIPASE, AMYLASE in the last 168 hours. No results for input(s): AMMONIA in the last 168 hours. Coagulation profile  Recent Labs Lab 05/04/16 0239  INR 1.19    Cardiac Enzymes:  Recent Labs Lab 05/02/16 1930 05/03/16 0022 05/03/16 0554  TROPONINI <0.03 <0.03 <0.03   BNP: Invalid input(s): POCBNP CBG:  Recent Labs Lab 05/02/16 1523 05/03/16 1654 05/04/16 0939  GLUCAP 116* 123* 83   D-Dimer No results for input(s): DDIMER in the last 72 hours. Hgb A1c  Recent Labs  05/02/16 1930  HGBA1C 5.5   Lipid Profile No results for input(s): CHOL, HDL, LDLCALC, TRIG, CHOLHDL, LDLDIRECT in the last 72 hours. Thyroid function studies  Recent Labs  05/02/16 1930  TSH 1.405   Anemia work up No results for input(s): VITAMINB12, FOLATE, FERRITIN, TIBC, IRON, RETICCTPCT in the  last 72 hours. Urinalysis    Component Value Date/Time   COLORURINE RED* 04/22/2015 1952   APPEARANCEUR TURBID* 04/22/2015 1952   LABSPEC 1.016 04/22/2015 1952   PHURINE 7.0 04/22/2015 1952   GLUCOSEU  NEGATIVE 04/22/2015 1952   HGBUR LARGE* 04/22/2015 [place] NEGATIVE 04/22/2015 [place] NEGATIVE 04/22/2015 1952   PROTEINUR >300* 04/22/2015 1952   UROBILINOGEN 1.0 04/22/2015 1952   NITRITE NEGATIVE 04/22/2015 1952   LEUKOCYTESUR LARGE* 04/22/2015 1952     Microbiology Recent Results (from the past 240 hour(s))  MRSA PCR Screening     Status: None   Collection Time: 05/03/16  1:15 AM  Result Value Ref Range Status   MRSA by PCR NEGATIVE NEGATIVE Final    Comment:        The GeneXpert MRSA Assay (FDA approved for NASAL specimens only), is one component of a comprehensive MRSA colonization surveillance program. It is not intended to diagnose MRSA infection nor to guide or monitor treatment for MRSA infections.        Inpatient Medications:   Scheduled Meds: . aspirin  81 mg Oral QHS  . calcium acetate  667 mg Oral TID WC  .  ceFAZolin (ANCEF) IV  2 g Intravenous Q M,W,F-HD  . cycloSPORINE modified  50 mg Oral BID  . dexamethasone  6 mg Intravenous Q6H   Followed by  . dexamethasone  4 mg Intravenous Q6H   Followed by  . [START ON 05/06/2016] dexamethasone  4 mg Intravenous Q8H  . donepezil  10 mg Oral QHS  . doxercalciferol  2 mcg Intravenous Q T,Th,Sa-HD  . famotidine (PEPCID) IV  20 mg Intravenous Q12H  . ferric gluconate (FERRLECIT/NULECIT) IV  62.5 mg Intravenous Once  . FLUoxetine  20 mg Oral Q breakfast  . hydrALAZINE  50 mg Oral TID  . hydrALAZINE  75 mg Oral Q8H  . HYDROcodone-acetaminophen      . levETIRAcetam  500 mg Intravenous Q12H  . levothyroxine  75 mcg Oral QAC breakfast  . metoprolol  5 mg Intravenous Q6H  . metoprolol tartrate  25 mg Oral BID  . multivitamin  1 tablet Oral q morning - 10a  . mycophenolate  250 mg Oral QHS  . mycophenolate  500 mg Oral q morning - 10a  . pantoprazole  40 mg Oral Daily  . pravastatin  10 mg Oral QHS  . sodium chloride flush  3 mL Intravenous Q12H  . sodium chloride flush  3 mL Intravenous  Q12H  . vitamin B-12  1,000 mcg Oral Q breakfast   Continuous Infusions:    Radiological Exams on Admission: Ct Head Wo Contrast  05/05/2016  CLINICAL DATA:  Followup subdural hematoma EXAM: CT HEAD WITHOUT CONTRAST TECHNIQUE: Contiguous axial images were obtained from the base of the skull through the vertex without intravenous contrast. COMPARISON:  05/03/2016 FINDINGS: There are changes consistent with prior right craniotomy and drain placement. Diffuse atrophic changes are again identified. There is been significant reduction in right-sided subdural hematoma. Persistent acute subdural hematoma is seen measuring approximately 11 mm in thickness. Chronic ischemic changes are again identified. The degree of midline shift has decreased to approximately 2 mm. No other focal abnormality is seen. IMPRESSION: Reduction in right-sided subdural hematoma. There remains some acute component of hemorrhage with a drain in place. Midline shift has decreased in the interval from the prior exam. Electronically Signed   By: Inez Catalina M.D.   On: 05/05/2016 06:52   Dg  Chest Port 1 View  05/04/2016  CLINICAL DATA:  Central line placement EXAM: PORTABLE CHEST 1 VIEW COMPARISON:  04/22/2015 FINDINGS: Cardiomediastinal silhouette is stable. Status post median sternotomy. No acute infiltrate or pulmonary edema. Stable right basilar atelectasis or scarring. There is right IJ central line with tip in distal SVC. No pneumothorax. Question old fracture deformity of the left humeral neck. IMPRESSION: No infiltrate or pulmonary edema. Stable right basilar atelectasis or scarring. Right IJ central line in place. No pneumothorax. Electronically Signed   By: [name] M.D.   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 Lorrene Reid. They will see him tomorrow morning, no indication for urgent dialysis, electrolytes reviewed  Anemia of chronic disease -likely anemia secondary to chronic kidney disease, stable  Depression -Prozac 20 mg daily   Dementia  -Aricept 10 mg QHS    Thank you for this consultation.  Our [facility] hospitalist team will follow the patient with you.   Time Spent: 40 minutes  Carlis Burnsworth, Geraldo Docker M.D. [facility] 05/05/2016, 8:33 AM

## 2016-05-05 NOTE — Progress Notes (Signed)
[name] [name] Progress Note  Assessment/Plan: 1. R. Subdural Hematoma/S/P R Craniotomy: Per primary/Neurosurgery. Awake and stable post op. Post OP Ancef changed to Ancef 2 G q 24 hours X 2-last dose at end of HD today 2. ESRD - TTS. Currently on HD -off schedule short HD Saturday to get back on schedule. NO HEPARIN 3. Hypertension/volume - Has been having orthostatic hypotension at home. Consider etiologies such as autonomic dysfunction post cardiac transplant, volume depletion or antihypertensives medications. On metoprolol and hydralazine, PRN labetolol. Had HD 05/03/16 Pre wt 70.7 Net UF 1998 Post wt 68.8. Under EDW. Run even today Reassess for Saturday tx. (prior to admission only getting to 70.5 most days post HD) 4. Anemia - HGB 9.4 Cont weekly fe. - Mircera 100 given 6/8 - prior outpt hgb was 11 5. Metabolic bone disease - Ca 8.0 C Ca 8.6 Phos 2.5 hold binders/cont VDRA. 2.5 Ca bath.  6. Nutrition -Albumin 2.9 change to renal diet, add renal vit/prostat 7. Immunosuppressant therapy S/P cardiac transplant: per primary cont cyclosporine, mycophenolate  Myriam Jacobson, PA-C [place] 05/05/2016,10:50 AM  LOS: 3 days   Pt seen, examined and agree w A/P as above. Orthostasis well known side effect of beta-blockers.  Will consider stopping.  Plan HD Saturday to get back on schedule.  [name] MD [name] pager [phone]    cell [phone] 05/06/2016, 12:20 PM    Subjective:   Remembers me from dialysis unit. No c/o  Objective Filed Vitals:   05/05/16 0848 05/05/16 0918 05/05/16 0948 05/05/16 1018  BP: 133/62 150/73 103/52 123/59  Pulse: 102 101 103 102  Temp:      TempSrc:      Resp:      Height:      Weight:      SpO2:       Physical Exam General: NAD Heart: RRR Lungs: no rales Abdomen: soft NT Extremities: no LE edema Dialysis Access: left upper AVF  Dialysis Orders: 4 hours 400/auto  1.5 EDW 69.5 kg 2.0K/2.25 Ca UF profile 2 No Heparin Hectorol 2 mcg IV q TTS (last PTH 297 03/16/16) Mircera 100 mcg IV Q 2 weeks (Last dose 04/27/16 HGB 11.0) Venofer 50 mg IV (last dose 04/29/16 Fe 83 Tsat 33% 04/13/16)  Additional Objective Labs: Basic Metabolic Panel:  Recent Labs Lab 05/03/16 1811  05/04/16 1355 05/04/16 1931 05/05/16 0630  NA 136  < > 135 133* 133*  K 3.7  < > 4.1 4.3 4.5  CL 96*  < > 99* 97* 97*  CO2 29  < > 27 23 23   GLUCOSE 116*  < > 131* 149* 215*  BUN 10  < > 19 22* 29*  CREATININE 3.30*  < > 5.09* 5.51* 6.10*  CALCIUM 8.7*  < > 8.0* 8.3* 8.3*  PHOS 2.5  --   --  4.6  --   < > = values in this interval not displayed. Liver Function Tests:  Recent Labs Lab 05/02/16 1930 05/03/16 0627 05/03/16 1811 05/04/16 1931  AST 14* 13*  --   --   ALT 12* 12*  --   --   ALKPHOS 111 125  --   --   BILITOT 0.6 0.6  --   --   PROT 6.0* 5.9*  --   --   ALBUMIN 3.3* 3.2* 3.2* 2.9*   CBC:  Recent Labs Lab 05/02/16 1436 05/03/16 0627 05/04/16 1355 05/04/16 1930 05/05/16 0630  WBC 8.5  7.5 8.9 9.2 8.0  HGB 10.5* 10.7* 9.7* 9.5* 9.4*  HCT 34.0* 34.7* 32.3* 31.5* 31.6*  MCV 99.4 100.3* 99.7 99.1 99.1  PLT 259 250 207 221 225   Cardiac Enzymes:  Recent Labs Lab 05/02/16 1930 05/03/16 0022 05/03/16 0554  TROPONINI <0.03 <0.03 <0.03   CBG:  Recent Labs Lab 05/02/16 1523 05/03/16 1654 05/04/16 0939  GLUCAP 116* 123* 83   Studies/Results: Ct Head Wo Contrast  05/05/2016  CLINICAL DATA:  Followup subdural hematoma EXAM: CT HEAD WITHOUT CONTRAST TECHNIQUE: Contiguous axial images were obtained from the base of the skull through the vertex without intravenous contrast. COMPARISON:  05/03/2016 FINDINGS: There are changes consistent with prior right craniotomy and drain placement. Diffuse atrophic changes are again identified. There is been significant reduction in right-sided subdural hematoma. Persistent acute subdural hematoma is seen measuring  approximately 11 mm in thickness. Chronic ischemic changes are again identified. The degree of midline shift has decreased to approximately 2 mm. No other focal abnormality is seen. IMPRESSION: Reduction in right-sided subdural hematoma. There remains some acute component of hemorrhage with a drain in place. Midline shift has decreased in the interval from the prior exam. Electronically Signed   By: Inez Catalina M.D.   On: 05/05/2016 06:52   Dg Chest Port 1 View  05/04/2016  CLINICAL DATA:  Central line placement EXAM: PORTABLE CHEST 1 VIEW COMPARISON:  04/22/2015 FINDINGS: Cardiomediastinal silhouette is stable. Status post median sternotomy. No acute infiltrate or pulmonary edema. Stable right basilar atelectasis or scarring. There is right IJ central line with tip in distal SVC. No pneumothorax. Question old fracture deformity of the left humeral neck. IMPRESSION: No infiltrate or pulmonary edema. Stable right basilar atelectasis or scarring. Right IJ central line in place. No pneumothorax. Electronically Signed   By: [name] M.D.   On: 05/04/2016 10:15   Medications:   . aspirin  81 mg Oral QHS  . calcium acetate  667 mg Oral TID WC  .  ceFAZolin (ANCEF) IV  2 g Intravenous Q M,W,F-HD  . cycloSPORINE modified  50 mg Oral BID  . dexamethasone  6 mg Intravenous Q6H   Followed by  . dexamethasone  4 mg Intravenous Q6H   Followed by  . [START ON 05/06/2016] dexamethasone  4 mg Intravenous Q8H  . donepezil  10 mg Oral QHS  . doxercalciferol      . doxercalciferol  2 mcg Intravenous Q T,Th,Sa-HD  . famotidine (PEPCID) IV  20 mg Intravenous Q12H  . ferric gluconate (FERRLECIT/NULECIT) IV  62.5 mg Intravenous Once  . FLUoxetine  20 mg Oral Q breakfast  . hydrALAZINE  50 mg Oral TID  . hydrALAZINE  75 mg Oral Q8H  . HYDROcodone-acetaminophen      . levETIRAcetam  500 mg Intravenous Q12H  . levothyroxine  75 mcg Oral QAC breakfast  . metoprolol  5 mg Intravenous Q6H  . metoprolol tartrate  25  mg Oral BID  . multivitamin  1 tablet Oral q morning - 10a  . mycophenolate  250 mg Oral QHS  . mycophenolate  500 mg Oral q morning - 10a  . pantoprazole  40 mg Oral Daily  . pravastatin  10 mg Oral QHS  . sodium chloride flush  3 mL Intravenous Q12H  . sodium chloride flush  3 mL Intravenous Q12H  . vitamin B-12  1,000 mcg Oral Q breakfast

## 2016-05-06 DIAGNOSIS — I429 Cardiomyopathy, unspecified: Secondary | ICD-10-CM | POA: Diagnosis present

## 2016-05-06 DIAGNOSIS — T82838A Hemorrhage of vascular prosthetic devices, implants and grafts, initial encounter: Secondary | ICD-10-CM

## 2016-05-06 LAB — RENAL FUNCTION PANEL
ALBUMIN: 2.8 g/dL — AB (ref 3.5–5.0)
ANION GAP: 9 (ref 5–15)
Albumin: 2.9 g/dL — ABNORMAL LOW (ref 3.5–5.0)
Anion gap: 7 (ref 5–15)
BUN: 21 mg/dL — ABNORMAL HIGH (ref 6–20)
BUN: 9 mg/dL (ref 6–20)
CHLORIDE: 99 mmol/L — AB (ref 101–111)
CO2: 28 mmol/L (ref 22–32)
CO2: 30 mmol/L (ref 22–32)
Calcium: 8.3 mg/dL — ABNORMAL LOW (ref 8.9–10.3)
Calcium: 8.1 mg/dL — ABNORMAL LOW (ref 8.9–10.3)
Chloride: 100 mmol/L — ABNORMAL LOW (ref 101–111)
Creatinine, Ser: 3.75 mg/dL — ABNORMAL HIGH (ref 0.61–1.24)
Creatinine, Ser: 2.05 mg/dL — ABNORMAL HIGH (ref 0.61–1.24)
GFR calc Af Amer: 17 mL/min — ABNORMAL LOW (ref >60)
GFR calc Af Amer: 35 mL/min — ABNORMAL LOW (ref >60)
GFR calc non Af Amer: 14 mL/min — ABNORMAL LOW (ref >60)
GFR calc non Af Amer: 30 mL/min — ABNORMAL LOW (ref >60)
GLUCOSE: 158 mg/dL — AB (ref 65–99)
Glucose, Bld: 136 mg/dL — ABNORMAL HIGH (ref 65–99)
PHOSPHORUS: 4.5 mg/dL (ref 2.5–4.6)
Phosphorus: 2.9 mg/dL (ref 2.5–4.6)
Potassium: 3.6 mmol/L (ref 3.5–5.1)
Potassium: 3.5 mmol/L (ref 3.5–5.1)
Sodium: 136 mmol/L (ref 135–145)
Sodium: 137 mmol/L (ref 135–145)

## 2016-05-06 LAB — CBC
HCT: 29.5 % — ABNORMAL LOW (ref 39.0–52.0)
Hemoglobin: 8.9 g/dL — ABNORMAL LOW (ref 13.0–17.0)
MCH: 30.2 pg (ref 26.0–34.0)
MCHC: 30.2 g/dL (ref 30.0–36.0)
MCV: 100 fL (ref 78.0–100.0)
Platelets: 225 10*3/uL (ref 150–400)
RBC: 2.95 MIL/uL — ABNORMAL LOW (ref 4.22–5.81)
RDW: 16.6 % — ABNORMAL HIGH (ref 11.5–15.5)
WBC: 12.9 10*3/uL — ABNORMAL HIGH (ref 4.0–10.5)

## 2016-05-06 MED ORDER — PENTAFLUOROPROP-TETRAFLUOROETH EX AERO: 1.0000 "application " | INHALATION_SPRAY | CUTANEOUS | Status: DC | PRN

## 2016-05-06 MED ORDER — FAMOTIDINE 20 MG PO TABS
20.0000 mg | ORAL_TABLET | Freq: Every day | ORAL | Status: DC
  Administered 2016-05-07 – 2016-05-10 (×4): 20 mg via ORAL
  Filled 2016-05-06 (×4): qty 1

## 2016-05-06 MED ORDER — LIDOCAINE-PRILOCAINE 2.5-2.5 % EX CREA
1.0000 "application " | TOPICAL_CREAM | CUTANEOUS | Status: DC | PRN
  Filled 2016-05-06: qty 5

## 2016-05-06 MED ORDER — DARBEPOETIN ALFA 100 MCG/0.5ML IJ SOSY
100.0000 ug | PREFILLED_SYRINGE | INTRAMUSCULAR | Status: DC
  Administered 2016-05-09: 100 ug via INTRAVENOUS
  Filled 2016-05-06: qty 0.5

## 2016-05-06 MED ORDER — ALTEPLASE 2 MG IJ SOLR
2.0000 mg | Freq: Once | INTRAMUSCULAR | Status: DC | PRN
  Filled 2016-05-06: qty 2

## 2016-05-06 MED ORDER — DOXERCALCIFEROL 4 MCG/2ML IV SOLN
INTRAVENOUS | Status: AC
Start: 2016-05-06 — End: 2016-05-06
  Filled 2016-05-06: qty 2

## 2016-05-06 MED ORDER — SODIUM CHLORIDE 0.9 % IV SOLN: 100.0000 mL | INTRAVENOUS | Status: DC | PRN

## 2016-05-06 MED ORDER — HEPARIN SODIUM (PORCINE) 1000 UNIT/ML DIALYSIS
1000.0000 [IU] | INTRAMUSCULAR | Status: DC | PRN
  Filled 2016-05-06: qty 1

## 2016-05-06 MED ORDER — NA FERRIC GLUC CPLX IN SUCROSE 12.5 MG/ML IV SOLN
62.5000 mg | INTRAVENOUS | Status: DC
  Administered 2016-05-09 (×2): 62.5 mg via INTRAVENOUS
  Filled 2016-05-06 (×3): qty 5

## 2016-05-06 MED ORDER — LIDOCAINE HCL (PF) 1 % IJ SOLN: 5.0000 mL | INTRAMUSCULAR | Status: DC | PRN

## 2016-05-06 NOTE — Progress Notes (Signed)
Patient ID: [name] [name], male   DOB: 20-Feb-1941, 75 y.o.   MRN: [id number] Asked to see patient due to ongoing bleeding following hemodialysis. Patient is known to me from prior left upper arm basilic vein transposition fistula in August 2015 according to the patient. He reports that he is having some occasional prolonged bleeding after AV access. He underwent inpatient hemodialysis today and had continued bleeding despite 30 minutes of pressure. I was in the operating room and he had a Ace wrap placed and is now back on the floor.  His dressing was removed in its entirety from his left upper arm basilic vein transposition fistula. The skin is intact over the fistula. There is no bleeding at all from the fistula currently. It looks as though a nice job has been done for rotation.  I replaced 4 x 4 and Ace wrap snugly over the fistula. This will be left until next hemodialysis on Tuesday. I do not see any evidence of ulceration or skin breakdown would continue use of this. Explained to the patient and his wife present that basilic vein transposition fistula was frequently have some fragile nature of the walls of this and that he may experience ongoing oozing with hemodialysis. Will not follow actively. Please consult if we can provide any further assistance

## 2016-05-06 NOTE — Progress Notes (Signed)
Pt was assessed.  Transported secured new bed. Pt scooted himself onto new bed with out injury and stated, " it feels good to move."  Patient left unit ~ 0810 to go to HD. Report was called to [place] (sp?) and questions answered. My number , [phone], was provided if she should have further questions.

## 2016-05-06 NOTE — Progress Notes (Signed)
[facility] Progress Note  Assessment/Plan: 1. R. Subdural Hematoma/S/P R Craniotomy: Per primary/Neurosurgery.Doing well 2. ESRD - TTS. Currently on HD -short HD Saturday to get back on schedule. NO HEPARIN- labs pending - next HD Tuesday 3. Hypertension/volume - Has been having orthostatic hypotension at home- kept even yesterday - wts today 2 kg less than yesterday; goal 1.5 L today 4. Anemia - HGB 9.4  - Mircera 100 given 6/8 - prior outpt hgb was 11- recheck today; ESA ordered ESA and IV Fe to redose next week 5. Metabolic bone disease - P 4.6 off binders -on VDRA  6. Nutrition -Albumin 2.9 change to renal diet,  renal vit/prostat 7. Immunosuppressant therapy S/P cardiac transplant: per primary cont cyclosporine, mycophenolate  Myriam Jacobson, PA-C [place] [phone] 05/06/2016,9:15 AM  LOS: 4 days   Pt seen, examined and agree w A/P as above. Patient but having prolonged bleeding from needle hole after HD this am.  Will ask [facility] to see.   [name] MD [facility] pager [phone]    cell [phone] 05/06/2016, 12:32 PM    Subjective:   No c/o. Hasn't had breakfast  Objective Filed Vitals:   05/06/16 0745 05/06/16 0800 05/06/16 0821 05/06/16 0830  BP:   149/51 165/76  Pulse: 63  64 60  Temp:  98.3 F (36.8 C) 98.1 F (36.7 C)   TempSrc:  Oral Oral   Resp: 19  18   Height:      Weight:   70.7 kg (155 lb 13.8 oz)   SpO2: 100%  100%    Physical Exam General: NAD at [facility] "17" [facility] Heart: RRR Lungs: no rales Abdomen: soft NT Extremities: no LE edema Dialysis Access:  Left upper AVF  Dialysis Orders: [facility] TTS 4 hours 400/auto 1.5 EDW 69.5 kg 2.0K/2.25 Ca UF profile 2 No Heparin Hectorol 2 mcg IV q TTS (last PTH 297 03/16/16) Mircera 100 mcg IV Q 2 weeks (Last dose 04/27/16 HGB 11.0) Venofer 50 mg IV (last dose 04/29/16 Fe 83 Tsat 33% 04/13/16)    Additional Objective Labs: Basic Metabolic  Panel:  Recent Labs Lab 05/03/16 1811  05/04/16 1355 05/04/16 1931 05/05/16 0630  NA 136  < > 135 133* 133*  K 3.7  < > 4.1 4.3 4.5  CL 96*  < > 99* 97* 97*  CO2 29  < > 27 23 23   GLUCOSE 116*  < > 131* 149* 215*  BUN 10  < > 19 22* 29*  CREATININE 3.30*  < > 5.09* 5.51* 6.10*  CALCIUM 8.7*  < > 8.0* 8.3* 8.3*  PHOS 2.5  --   --  4.6  --   < > = values in this interval not displayed. Liver Function Tests:  Recent Labs Lab 05/02/16 1930 05/03/16 0627 05/03/16 1811 05/04/16 1931  AST 14* 13*  --   --   ALT 12* 12*  --   --   ALKPHOS 111 125  --   --   BILITOT 0.6 0.6  --   --   PROT 6.0* 5.9*  --   --   ALBUMIN 3.3* 3.2* 3.2* 2.9*   CBC:  Recent Labs Lab 05/02/16 1436 05/03/16 0627 05/04/16 1355 05/04/16 1930 05/05/16 0630  WBC 8.5 7.5 8.9 9.2 8.0  HGB 10.5* 10.7* 9.7* 9.5* 9.4*  HCT 34.0* 34.7* 32.3* 31.5* 31.6*  MCV 99.4 100.3* 99.7 99.1 99.1  PLT 259 250 207 221 225  Cardiac Enzymes:  Recent Labs Lab 05/02/16 1930 05/03/16 0022 05/03/16 0554  TROPONINI <0.03 <0.03 <0.03   CBG:  Recent Labs Lab 05/02/16 1523 05/03/16 1654 05/04/16 0939  GLUCAP 116* 123* 83   Studies/Results: Ct Head Wo Contrast  05/05/2016  CLINICAL DATA:  Followup subdural hematoma EXAM: CT HEAD WITHOUT CONTRAST TECHNIQUE: Contiguous axial images were obtained from the base of the skull through the vertex without intravenous contrast. COMPARISON:  05/03/2016 FINDINGS: There are changes consistent with prior right craniotomy and drain placement. Diffuse atrophic changes are again identified. There is been significant reduction in right-sided subdural hematoma. Persistent acute subdural hematoma is seen measuring approximately 11 mm in thickness. Chronic ischemic changes are again identified. The degree of midline shift has decreased to approximately 2 mm. No other focal abnormality is seen. IMPRESSION: Reduction in right-sided subdural hematoma. There remains some acute component of  hemorrhage with a drain in place. Midline shift has decreased in the interval from the prior exam. Electronically Signed   By: Inez Catalina M.D.   On: 05/05/2016 06:52   Dg Chest Port 1 View  05/04/2016  CLINICAL DATA:  Central line placement EXAM: PORTABLE CHEST 1 VIEW COMPARISON:  04/22/2015 FINDINGS: Cardiomediastinal silhouette is stable. Status post median sternotomy. No acute infiltrate or pulmonary edema. Stable right basilar atelectasis or scarring. There is right IJ central line with tip in distal SVC. No pneumothorax. Question old fracture deformity of the left humeral neck. IMPRESSION: No infiltrate or pulmonary edema. Stable right basilar atelectasis or scarring. Right IJ central line in place. No pneumothorax. Electronically Signed   By: [name] M.D.   On: 05/04/2016 10:15   Medications:   . aspirin  81 mg Oral QHS  . cycloSPORINE modified  50 mg Oral BID  . [START ON 05/09/2016] darbepoetin (ARANESP) injection - DIALYSIS  100 mcg Intravenous Q Tue-HD  . dexamethasone  4 mg Intravenous Q6H   Followed by  . dexamethasone  4 mg Intravenous Q8H  . donepezil  10 mg Oral QHS  . doxercalciferol  2 mcg Intravenous Q T,Th,Sa-HD  . famotidine (PEPCID) IV  20 mg Intravenous Q12H  . feeding supplement (PRO-STAT SUGAR FREE 64)  30 mL Oral BID  . [START ON 05/09/2016] ferric gluconate (FERRLECIT/NULECIT) IV  62.5 mg Intravenous Q Tue-HD  . FLUoxetine  20 mg Oral Q breakfast  . hydrALAZINE  50 mg Oral TID  . levETIRAcetam  500 mg Intravenous Q12H  . levothyroxine  75 mcg Oral QAC breakfast  . metoprolol tartrate  25 mg Oral BID  . multivitamin  1 tablet Oral q morning - 10a  . mycophenolate  250 mg Oral QHS  . mycophenolate  500 mg Oral q morning - 10a  . pantoprazole  40 mg Oral Daily  . pravastatin  10 mg Oral QHS  . sodium chloride flush  3 mL Intravenous Q12H  . sodium chloride flush  3 mL Intravenous Q12H  . vitamin B-12  1,000 mcg Oral Q breakfast

## 2016-05-06 NOTE — Consult Note (Signed)
Medical Consultation   [name] [name]  [id number]  DOB: [birth year]  DOA: 05/02/2016  PCP: Leamon Arnt, MD    Requesting physician: [facility]  Reason for consultation: Multiple medical problems   History of Present Illness: [name] [name] is an 75 y.o. male 75 year old WM PMHx Dementia, CVA, ESRD on HD T/Th/Sat at [facility], Cardiac transplant in 1995, followed at [facility], HTN, MI (prior to heart transplant), HLD ,DM Type 2 with complication, skin cancer  who presents to the ED today complaining of increased weakness and a fall. Patient states that he has become progressively more weak over the last 6 weeks , has had 3 falls in the last week. 2 falls on Friday and one fall today. He states that he's had small subdural hematomas since April 2015. Patient states today he was using a rolling walker , lost his balance and fell. He denies any preceding focal symptoms, in the preceding week. Patient was recently seen at [facility] by his cardiologist and his hydralazine dose was increased. He also had a recent 2-D echo done on 04/18/16 was found to have an EF of 60-65% without any segmental wall motion abnormalities. He denies any history of A. fib.  Patient states he has been going to physical therapy for the last 5 weeks without improvement in his symptoms. Patient states he has issues with orthostatic hypotension and gets very dizzy upon standing which she states is typically why he falls down. He does have weakness in his left leg which is been ongoing for approximately one year. He states he often drags it when walking. His hydralazine was recently increased to 75 mg 3 times a day. Marland Kitchen He denies chest pain or shortness of breath. Patient also complains of feeling somewhat dizzy and lightheaded due to poor oral intake over the last couple of days but denies any fever chills,rigors.  6/15 Patient S/P Right  frontal parietal craniotomy with evacuation of subdural hematoma. Placement of subdural drain by Neurosurgery.  6/17 postop day 3 right frontal craniotomy with evacuation of SDH, blurred vision and headache have resolved.    Review of Systems:  Review of Systems  Constitutional: Negative for fever, chills and malaise/fatigue.  HENT: Negative for ear discharge, ear pain, hearing loss, sore throat and tinnitus.   Eyes: Positive for blurred vision. Negative for double vision, photophobia and pain.  Respiratory: Negative for cough and wheezing.   Cardiovascular: Negative for chest pain and leg swelling.  Gastrointestinal: Negative for abdominal pain.  Musculoskeletal: Negative for neck pain.  Skin: Negative for itching and rash.  Neurological: Positive for headaches. Negative for dizziness, sensory change, speech change, focal weakness and weakness.  Psychiatric/Behavioral: Negative for depression, hallucinations and memory loss.     Past Medical History: Past Medical History  Diagnosis Date  . CKD III-IV     a. since transplant in 2001 with significant worsening since 06/2013.  Marland Kitchen Hypertension     a. Long h/o HTN, came off of all meds following transplant/wt loss-->meds resumed 06/2013, difficult to control since.  Marland Kitchen History of stroke   . Hypercalcemia     a. 06/2013 Hospitalized in [place].  Marland Kitchen Heart transplanted ([place])     a. 1995 CABG x 4 in [place], [place];  b. 2001 Developed CHF;  c. 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 10/2000 Cardiac transplant @ [facility], [place];  e. Historically nl  Bx and stress testing.  Last stress test ~ 5 yrs ago.  Marland Kitchen Hernia of abdominal wall   . Hyperlipidemia   . Orthostatic hypotension     a. Has tried support hose and compression stockings - prefers not to wear.  . Coronary artery disease   . Myocardial infarction ([place])     PRIOR TO HEART TRANSPLANT  . Shortness of breath     EXERTION  . Orthostatic dizziness   . Stroke ([place]) 06/2013    NO RESIDUAL - WAS  CONFUSED AT TIME - THAT HAS RESOLVED  . Diabetes mellitus without complication (HCC)     FASTING CBG 90S  . Headache(784.0)   . Cancer ([place])     SKIN  . Anemia   . History of blood transfusion   . Dementia     mild    Past Surgical History: Past Surgical History  Procedure Laterality Date  . Cardiac surgery    . Heart transplant    . Cataract extraction    . Vasectomy    . Skin graft full thickness leg      cat scratch that did not heal  . Coronary artery bypass graft    . Eye surgery Bilateral     CATARACTS  . Bascilic vein transposition Left 07/06/2014    Procedure: [place];  Surgeon: Rosetta Posner, MD;  Location: [facility] OR;  Service: Vascular;  Laterality: Left;  . Craniotomy Right 05/04/2016    Procedure: Right Craniotomy for Evacuation of Subdural Hematoma;  Surgeon: Earnie Larsson, MD;  Location: [facility] NEURO ORS;  Service: Neurosurgery;  Laterality: Right;     Allergies:   Allergies  Allergen Reactions  . Amlodipine Swelling    Swelling of the lower body    . Norvasc [Amlodipine Besylate] Swelling and Other (See Comments)    Swelling of the lower body   . Ace Inhibitors Other (See Comments)    Dialysis  . Angiotensin Receptor Blockers Other (See Comments)    Dialysis     Social History:  reports that he has never smoked. He has never used smokeless tobacco. He reports that he does not drink alcohol or use illicit drugs.   Family History: Family History  Problem Relation Age of Onset  . Heart attack Father     died @ 69  . Diabetes Father   . Heart attack Mother   . Diabetes Mother   . Kidney disease Maternal Aunt       Procedures/Significant Events:  6/13 CT head WO contrast;-Large mixed density subdural hematoma on the right compatible with acute on chronic SDH. Present on prior CT. 5 mm midline shift to the left. 6/14 CT head WO contrast;-Stable large RIGHT mixed density SDH resulting in 4 mm RIGHT to LEFT midline shift. No ventricular  entrapment 6/15 Right frontal parietal craniotomy with evacuation of subdural hematoma. Placement of subdural drain 6/16 Echocardiogram;- Left ventricle: mild LVH. -LVEF= 60% to 65%. - Left atrium:  moderately dilated.   Cultures 6/14 MRSA by PCR negative  Antimicrobials: Ancef 6/15>>   Devices    LINES / TUBES:  Right IJ 6/15>> Right radial art line 6/15>> Left upper arm AV fistula>>    Continuous Infusions:    Physical Exam: Filed Vitals:   05/06/16 0900 05/06/16 0930 05/06/16 1000 05/06/16 1030  BP: 160/70 160/70 135/62 146/68  Pulse: 66 64 66 69  Temp:      TempSrc:      Resp:  Height:      Weight:      SpO2:        General: A/O 4, NAD,  right occipital region of skull Staple line shows no sign of infection., No acute respiratory distress Eyes: negative scleral hemorrhage, negative anisocoria, negative icterus ENT: Negative Runny nose, negative gingival bleeding, Neck:  Negative scars, masses, torticollis, lymphadenopathy, JVD, right IJ in place cleaning covered, negative sign of infection Lungs: Clear to auscultation bilaterally without wheezes or crackles Cardiovascular: Tachycardic, Regular rhythm without murmur gallop or rub normal S1 and S2 Abdomen: negative abdominal pain, nondistended, positive soft, bowel sounds, no rebound, no ascites, no appreciable mass Extremities: No significant cyanosis, clubbing, or edema bilateral lower extremities Skin: Negative rashes, lesions, ulcers, incision site covered and clean did not take down dressing Psychiatric:  Negative depression, negative anxiety, negative fatigue, negative mania  Central nervous system:  Cranial nerves II through XII intact, tongue/uvula midline, all extremities muscle strength 5/5, sensation intact throughout, finger nose finger right hand  within normal limits, quick finger touch right within normal limits, negative dysarthria, negative expressive aphasia, negative receptive  aphasia.  Data reviewed:  I have personally reviewed following labs and imaging studies Labs:  CBC:  Recent Labs Lab 05/02/16 1436 05/03/16 0627 05/04/16 1355 05/04/16 1930 05/05/16 0630  WBC 8.5 7.5 8.9 9.2 8.0  HGB 10.5* 10.7* 9.7* 9.5* 9.4*  HCT 34.0* 34.7* 32.3* 31.5* 31.6*  MCV 99.4 100.3* 99.7 99.1 99.1  PLT 259 250 207 221 [id number]    Basic Metabolic Panel:  Recent Labs Lab 05/02/16 1930  05/03/16 1811 05/04/16 0239 05/04/16 1355 05/04/16 1931 05/05/16 0630 05/06/16 0930  NA  --   < > 136 135 135 133* 133* 136  K  --   < > 3.7 4.2 4.1 4.3 4.5 3.6  CL  --   < > 96* 96* 99* 97* 97* 99*  CO2  --   < > 29 28 27 23 23 28   GLUCOSE  --   < > 116* 123* 131* 149* 215* 158*  BUN  --   < > 10 16 19  22* 29* 21*  CREATININE  --   < > 3.30* 4.58* 5.09* 5.51* 6.10* 3.75*  CALCIUM  --   < > 8.7* 8.5* 8.0* 8.3* 8.3* 8.3*  MG 2.0  --   --  2.0  --   --   --   --   PHOS  --   --  2.5  --   --  4.6  --  4.5  < > = values in this interval not displayed. GFR Estimated Creatinine Clearance: 17 mL/min (by C-G formula based on Cr of 3.75). Liver Function Tests:  Recent Labs Lab 05/02/16 1930 05/03/16 0627 05/03/16 1811 05/04/16 1931 05/06/16 0930  AST 14* 13*  --   --   --   ALT 12* 12*  --   --   --   ALKPHOS 111 125  --   --   --   BILITOT 0.6 0.6  --   --   --   PROT 6.0* 5.9*  --   --   --   ALBUMIN 3.3* 3.2* 3.2* 2.9* 2.8*   No results for input(s): LIPASE, AMYLASE in the last 168 hours. No results for input(s): AMMONIA in the last 168 hours. Coagulation profile  Recent Labs Lab 05/04/16 0239  INR 1.19    Cardiac Enzymes:  Recent Labs Lab 05/02/16 1930 05/03/16 0022 05/03/16 0554  TROPONINI <0.03 <0.03 <0.03   BNP: Invalid input(s): POCBNP CBG:  Recent Labs Lab 05/02/16 1523 05/03/16 1654 05/04/16 0939  GLUCAP 116* 123* 83   D-Dimer No results for input(s): DDIMER in the last 72 hours. Hgb A1c No results for input(s): HGBA1C in the last 72  hours. Lipid Profile No results for input(s): CHOL, HDL, LDLCALC, TRIG, CHOLHDL, LDLDIRECT in the last 72 hours. Thyroid function studies No results for input(s): TSH, T4TOTAL, T3FREE, THYROIDAB in the last 72 hours.  Invalid input(s): FREET3 Anemia work up No results for input(s): VITAMINB12, FOLATE, FERRITIN, TIBC, IRON, RETICCTPCT in the last 72 hours. Urinalysis    Component Value Date/Time   COLORURINE RED* 04/22/2015 1952   APPEARANCEUR TURBID* 04/22/2015 1952   LABSPEC 1.016 04/22/2015 1952   PHURINE 7.0 04/22/2015 1952   GLUCOSEU NEGATIVE 04/22/2015 1952   HGBUR LARGE* 04/22/2015 [place] NEGATIVE 04/22/2015 [place] NEGATIVE 04/22/2015 1952   PROTEINUR >300* 04/22/2015 1952   UROBILINOGEN 1.0 04/22/2015 1952   NITRITE NEGATIVE 04/22/2015 1952   LEUKOCYTESUR LARGE* 04/22/2015 1952     Microbiology Recent Results (from the past 240 hour(s))  MRSA PCR Screening     Status: None   Collection Time: 05/03/16  1:15 AM  Result Value Ref Range Status   MRSA by PCR NEGATIVE NEGATIVE Final    Comment:        The GeneXpert MRSA Assay (FDA approved for NASAL specimens only), is one component of a comprehensive MRSA colonization surveillance program. It is not intended to diagnose MRSA infection nor to guide or monitor treatment for MRSA infections.        Inpatient Medications:   Scheduled Meds: . aspirin  81 mg Oral QHS  . cycloSPORINE modified  50 mg Oral BID  . [START ON 05/09/2016] darbepoetin (ARANESP) injection - DIALYSIS  100 mcg Intravenous Q Tue-HD  . dexamethasone  4 mg Intravenous Q6H   Followed by  . dexamethasone  4 mg Intravenous Q8H  . donepezil  10 mg Oral QHS  . doxercalciferol  2 mcg Intravenous Q T,Th,Sa-HD  . famotidine (PEPCID) IV  20 mg Intravenous Q12H  . feeding supplement (PRO-STAT SUGAR FREE 64)  30 mL Oral BID  . [START ON 05/09/2016] ferric gluconate (FERRLECIT/NULECIT) IV  62.5 mg Intravenous Q Tue-HD  . FLUoxetine   20 mg Oral Q breakfast  . hydrALAZINE  50 mg Oral TID  . levETIRAcetam  500 mg Intravenous Q12H  . levothyroxine  75 mcg Oral QAC breakfast  . metoprolol tartrate  25 mg Oral BID  . multivitamin  1 tablet Oral q morning - 10a  . mycophenolate  250 mg Oral QHS  . mycophenolate  500 mg Oral q morning - 10a  . pantoprazole  40 mg Oral Daily  . pravastatin  10 mg Oral QHS  . sodium chloride flush  3 mL Intravenous Q12H  . sodium chloride flush  3 mL Intravenous Q12H  . vitamin B-12  1,000 mcg Oral Q breakfast   Continuous Infusions:    Radiological Exams on Admission: Ct Head Wo Contrast  05/05/2016  CLINICAL DATA:  Followup subdural hematoma EXAM: CT HEAD WITHOUT CONTRAST TECHNIQUE: Contiguous axial images were obtained from the base of the skull through the vertex without intravenous contrast. COMPARISON:  05/03/2016 FINDINGS: There are changes consistent with prior right craniotomy and drain placement. Diffuse atrophic changes are again identified. There is been significant reduction in right-sided subdural hematoma. Persistent acute subdural hematoma is  seen measuring approximately 11 mm in thickness. Chronic ischemic changes are again identified. The degree of midline shift has decreased to approximately 2 mm. No other focal abnormality is seen. IMPRESSION: Reduction in right-sided subdural hematoma. There remains some acute component of hemorrhage with a drain in place. Midline shift has decreased in the interval from the prior exam. Electronically Signed   By: Inez Catalina M.D.   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 Deri Fuelling Neurosurgery performed right sided craniotomy with evacuation on 6/15 -6/17 spoke with Dr. Vertell Limber neurosurgery No counterindication for mobilization. Consult to PT/OT placed. Ambulate with assistance q shift -Continue management per neurosurgery   Heart transplanted/mild Cardiomyopathy  -Cyclosporine 50 mg BID -Mycophenolate 500 mg qAm/ 250 mg QHS -Cyclosporine and CellCept levels pending -6/16 Echocardiogram; shows cardiac function improved when compared to echocardiogram from 07/01/2014 see results below  Hypertension -Allow for permissive hypertension secondary to SDH; SBP goal 145-170; DBP goal<105 -Hydralazine 50 mg TID  -Metoprolol 25 mg BID   DM (diabetes mellitus), type 2 controlled with complications  -0000000 hemoglobin A1c= 5.5 -Will start the patient on sliding scale insulin  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 Lorrene Reid. They will see him tomorrow morning, no indication for urgent dialysis, electrolytes reviewed  Anemia of chronic disease -likely anemia secondary to chronic kidney disease, stable  Depression -Prozac 20 mg daily   Dementia  -Aricept 10 mg QHS    Thank you for this consultation.  Our [facility] hospitalist team will follow the patient with you.   Time Spent: 40 minutes  Shellene Sweigert, Geraldo Docker M.D. [facility] 05/06/2016, 11:26 AM

## 2016-05-06 NOTE — Progress Notes (Signed)
Patient ID: [name] [name], male   DOB: 08-09-1941, 75 y.o.   MRN: [id number] Subjective:  The patient is alert and pleasant. He is in no apparent distress. He is mildly confused.  Objective: Vital signs in last 24 hours: Temp:  [97.7 F (36.5 C)-98.1 F (36.7 C)] 98 F (36.7 C) (06/17 0400) Pulse Rate:  [61-103] 63 (06/17 0500) Resp:  [11-26] 14 (06/17 0500) BP: (103-188)/(43-119) 141/48 mmHg (06/17 0500) SpO2:  [95 %-100 %] 98 % (06/17 0500) Weight:  [72.6 kg (160 lb 0.9 oz)-72.9 kg (160 lb 11.5 oz)] 72.6 kg (160 lb 0.9 oz) (06/16 1200)  Intake/Output from previous day: 06/16 0701 - 06/17 0700 In: 335 [P.O.:30; IV Piggyback:305] Out: -3  Intake/Output this shift: Total I/O In: 80 [P.O.:30; IV Piggyback:50] Out: -   Physical exam the patient is alert and oriented 1. He thinks his at home. He is moving all 4 extremities well. His pupils are equal. His speech is normal.  Lab Results:  Recent Labs  05/04/16 1930 05/05/16 0630  WBC 9.2 8.0  HGB 9.5* 9.4*  HCT 31.5* 31.6*  PLT 221 225   BMET  Recent Labs  05/04/16 1931 05/05/16 0630  NA 133* 133*  K 4.3 4.5  CL 97* 97*  CO2 23 23  GLUCOSE 149* 215*  BUN 22* 29*  CREATININE 5.51* 6.10*  CALCIUM 8.3* 8.3*    Studies/Results: Ct Head Wo Contrast  05/05/2016  CLINICAL DATA:  Followup subdural hematoma EXAM: CT HEAD WITHOUT CONTRAST TECHNIQUE: Contiguous axial images were obtained from the base of the skull through the vertex without intravenous contrast. COMPARISON:  05/03/2016 FINDINGS: There are changes consistent with prior right craniotomy and drain placement. Diffuse atrophic changes are again identified. There is been significant reduction in right-sided subdural hematoma. Persistent acute subdural hematoma is seen measuring approximately 11 mm in thickness. Chronic ischemic changes are again identified. The degree of midline shift has decreased to approximately 2 mm. No other focal abnormality is seen.  IMPRESSION: Reduction in right-sided subdural hematoma. There remains some acute component of hemorrhage with a drain in place. Midline shift has decreased in the interval from the prior exam. Electronically Signed   By: Inez Catalina M.D.   On: 05/05/2016 06:52   Dg Chest Port 1 View  05/04/2016  CLINICAL DATA:  Central line placement EXAM: PORTABLE CHEST 1 VIEW COMPARISON:  04/22/2015 FINDINGS: Cardiomediastinal silhouette is stable. Status post median sternotomy. No acute infiltrate or pulmonary edema. Stable right basilar atelectasis or scarring. There is right IJ central line with tip in distal SVC. No pneumothorax. Question old fracture deformity of the left humeral neck. IMPRESSION: No infiltrate or pulmonary edema. Stable right basilar atelectasis or scarring. Right IJ central line in place. No pneumothorax. Electronically Signed   By: [name] M.D.   On: 05/04/2016 10:15    Assessment/Plan: Status post bur holes for right subdural hematoma: The patient is doing well clinically. I will transfer him to the floor.  Hyponatremia: This is mild. I will plan to repeat his BMP tomorrow.  LOS: 4 days     [name] [name] D 05/06/2016, 6:28 AM

## 2016-05-07 LAB — BASIC METABOLIC PANEL
ANION GAP: 9 (ref 5–15)
BUN: 26 mg/dL — ABNORMAL HIGH (ref 6–20)
CALCIUM: 8.1 mg/dL — AB (ref 8.9–10.3)
CHLORIDE: 97 mmol/L — AB (ref 101–111)
CO2: 29 mmol/L (ref 22–32)
CREATININE: 3.43 mg/dL — AB (ref 0.61–1.24)
GFR calc non Af Amer: 16 mL/min — ABNORMAL LOW (ref >60)
GFR, EST AFRICAN AMERICAN: 19 mL/min — AB (ref >60)
Glucose, Bld: 142 mg/dL — ABNORMAL HIGH (ref 65–99)
Potassium: 3.7 mmol/L (ref 3.5–5.1)
SODIUM: 135 mmol/L (ref 135–145)

## 2016-05-07 LAB — CYCLOSPORINE: CYCLOSPORINE, LABCORP: 78 ng/mL — AB (ref 100–400)

## 2016-05-07 MED ORDER — LEVETIRACETAM 500 MG PO TABS
500.0000 mg | ORAL_TABLET | Freq: Two times a day (BID) | ORAL | Status: DC
  Administered 2016-05-07 – 2016-05-10 (×6): 500 mg via ORAL
  Filled 2016-05-07 (×6): qty 1

## 2016-05-07 MED ORDER — HYDRALAZINE HCL 50 MG PO TABS
50.0000 mg | ORAL_TABLET | Freq: Four times a day (QID) | ORAL | Status: DC
  Administered 2016-05-08 (×4): 50 mg via ORAL
  Filled 2016-05-07 (×4): qty 1

## 2016-05-07 NOTE — Progress Notes (Signed)
Patient ID: [name] [name], male   DOB: [birth year], 75 y.o.   MRN: [id number] Subjective:  The patient is alert and pleasant. He is in no apparent distress. He does not feel he is able to go home and is planning to go to a skilled nursing facility.  Objective: Vital signs in last 24 hours: Temp:  [97.7 F (36.5 C)-98.4 F (36.9 C)] 98.3 F (36.8 C) (06/18 0943) Pulse Rate:  [61-73] 73 (06/18 0943) Resp:  [16-20] 20 (06/18 0943) BP: (133-195)/(43-68) 195/68 mmHg (06/18 0943) SpO2:  [97 %-100 %] 99 % (06/18 0943)  Intake/Output from previous day: 06/17 0701 - 06/18 0700 In: 585 [P.O.:480; IV Piggyback:105] Out: 1000  Intake/Output this shift: Total I/O In: 360 [P.O.:360] Out: 100 [Urine:100]  Physical exam patient is alert and oriented. His speech is normal. His strength is normal.  Lab Results:  Recent Labs  05/05/16 0630 05/06/16 1525  WBC 8.0 12.9*  HGB 9.4* 8.9*  HCT 31.6* 29.5*  PLT 225 225   BMET  Recent Labs  05/06/16 1539 05/07/16 0449  NA 137 135  K 3.5 3.7  CL 100* 97*  CO2 30 29  GLUCOSE 136* 142*  BUN 9 26*  CREATININE 2.05* 3.43*  CALCIUM 8.1* 8.1*    Studies/Results: No results found.  Assessment/Plan: Postop day #3: The patient is doing well clinically. We will need to arrange for nursing home placement.  LOS: 5 days     Tylar Amborn D 05/07/2016, 12:14 PM

## 2016-05-07 NOTE — Progress Notes (Signed)
[facility] Progress Note  Assessment: 1. R SDH / SP craniotomy: Per primary/Neurosurgery.Doing well 2. 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 Hypertension - BP's higher side of normal, on metop/ hydral 4. Volume - has been having orthostatic hypotension at home for years, it started after his heart transplant; at dry wt today. BP's up in lying position. When ambulating will check orthostatics. 5. Anemia - HGB 9.4  - Mircera 100 given 6/8 - prior outpt hgb was 11- recheck today; ESA ordered ESA and IV Fe to redose next week 6. Metabolic bone disease - P 4.6 off binders -on VDRA  7. Nutrition -Albumin 2.9 change to renal diet,  renal vit/prostat 8. Immunosuppressant therapy S/P cardiac transplant: per primary cont cyclosporine, mycophenolate  Plan - cont HD TTS, no hep      [name] MD [facility] pager [phone]    cell [phone] 05/07/2016, 2:11 PM    Subjective:   No c/o   Objective Filed Vitals:   05/06/16 2100 05/07/16 0232 05/07/16 0535 05/07/16 0943  BP: 149/54 139/43 137/59 195/68  Pulse: 71 61 67 73  Temp: 97.7 F (36.5 C) 98.2 F (36.8 C) 98.4 F (36.9 C) 98.3 F (36.8 C)  TempSrc: Oral Oral Oral Oral  Resp: 16 16 18 20   Height:      Weight:      SpO2: 100% 100% 100% 99%   Physical Exam General: NAD at [facility] "17" [facility] Heart: RRR Lungs: no rales Abdomen: soft NT Extremities: no LE edema Dialysis Access:  Left upper AVF  Dialysis:  SW TTS 4h   69.5kg   2/2.25 bath  P2  Hep none  LUA AVF Hectorol 2 mcg IV q TTS (last PTH 297 03/16/16) Mircera 100 mcg IV Q 2 weeks (Last dose 04/27/16 HGB 11.0) Venofer 50 mg IV (last dose 04/29/16 Fe 83 Tsat 33% 04/13/16)    Additional Objective Labs: Basic Metabolic Panel:  Recent Labs Lab 05/04/16 1931  05/06/16 0930 05/06/16 1539 05/07/16 0449  NA 133*  < > 136 137 135  K 4.3  < > 3.6 3.5 3.7  CL 97*  < > 99* 100* 97*  CO2 23  < > 28 30 29   GLUCOSE 149*  < > 158* 136* 142*  BUN  22*  < > 21* 9 26*  CREATININE 5.51*  < > 3.75* 2.05* 3.43*  CALCIUM 8.3*  < > 8.3* 8.1* 8.1*  PHOS 4.6  --  4.5 2.9  --   < > = values in this interval not displayed. Liver Function Tests:  Recent Labs Lab 05/02/16 1930 05/03/16 0627  05/04/16 1931 05/06/16 0930 05/06/16 1539  AST 14* 13*  --   --   --   --   ALT 12* 12*  --   --   --   --   ALKPHOS 111 125  --   --   --   --   BILITOT 0.6 0.6  --   --   --   --   PROT 6.0* 5.9*  --   --   --   --   ALBUMIN 3.3* 3.2*  < > 2.9* 2.8* 2.9*  < > = values in this interval not displayed. CBC:  Recent Labs Lab 05/03/16 0627 05/04/16 1355 05/04/16 1930 05/05/16 0630 05/06/16 1525  WBC 7.5 8.9 9.2 8.0 12.9*  HGB 10.7* 9.7* 9.5* 9.4* 8.9*  HCT 34.7* 32.3* 31.5* 31.6* 29.5*  MCV 100.3* 99.7 99.1 99.1  100.0  PLT 250 207 221 225 225  Cardiac Enzymes:  Recent Labs Lab 05/02/16 1930 05/03/16 0022 05/03/16 0554  TROPONINI <0.03 <0.03 <0.03   CBG:  Recent Labs Lab 05/02/16 1523 05/03/16 1654 05/04/16 0939  GLUCAP 116* 123* 83   Studies/Results: No results found. Medications:   . aspirin  81 mg Oral QHS  . cycloSPORINE modified  50 mg Oral BID  . [START ON 05/09/2016] darbepoetin (ARANESP) injection - DIALYSIS  100 mcg Intravenous Q Tue-HD  . dexamethasone  4 mg Intravenous Q8H  . donepezil  10 mg Oral QHS  . doxercalciferol  2 mcg Intravenous Q T,Th,Sa-HD  . famotidine  20 mg Oral Daily  . feeding supplement (PRO-STAT SUGAR FREE 64)  30 mL Oral BID  . [START ON 05/09/2016] ferric gluconate (FERRLECIT/NULECIT) IV  62.5 mg Intravenous Q Tue-HD  . FLUoxetine  20 mg Oral Q breakfast  . hydrALAZINE  50 mg Oral TID  . levETIRAcetam  500 mg Oral BID  . levothyroxine  75 mcg Oral QAC breakfast  . metoprolol tartrate  25 mg Oral BID  . multivitamin  1 tablet Oral q morning - 10a  . mycophenolate  250 mg Oral QHS  . mycophenolate  500 mg Oral q morning - 10a  . pantoprazole  40 mg Oral Daily  . pravastatin  10 mg Oral  QHS  . sodium chloride flush  3 mL Intravenous Q12H  . sodium chloride flush  3 mL Intravenous Q12H  . vitamin B-12  1,000 mcg Oral Q breakfast

## 2016-05-07 NOTE — Consult Note (Signed)
Medical Consultation   [name] [name]  [id number]  DOB: Aug 12, 1941  DOA: 05/02/2016  PCP: Leamon Arnt, MD    Requesting physician: [facility]  Reason for consultation: Multiple medical problems   History of Present Illness: [name] [name] is an 75 y.o. male 75 year old WM PMHx Dementia, CVA, ESRD on HD T/Th/Sat at [facility], Cardiac transplant in 1995, followed at [facility], HTN, MI (prior to heart transplant), HLD ,DM Type 2 with complication, skin cancer  who presents to the ED today complaining of increased weakness and a fall. Patient states that he has become progressively more weak over the last 6 weeks , has had 3 falls in the last week. 2 falls on Friday and one fall today. He states that he's had small subdural hematomas since April 2015. Patient states today he was using a rolling walker , lost his balance and fell. He denies any preceding focal symptoms, in the preceding week. Patient was recently seen at [facility] by his cardiologist and his hydralazine dose was increased. He also had a recent 2-D echo done on 04/18/16 was found to have an EF of 60-65% without any segmental wall motion abnormalities. He denies any history of A. fib.  Patient states he has been going to physical therapy for the last 5 weeks without improvement in his symptoms. Patient states he has issues with orthostatic hypotension and gets very dizzy upon standing which she states is typically why he falls down. He does have weakness in his left leg which is been ongoing for approximately one year. He states he often drags it when walking. His hydralazine was recently increased to 75 mg 3 times a day. Marland Kitchen He denies chest pain or shortness of breath. Patient also complains of feeling somewhat dizzy and lightheaded due to poor oral intake over the last couple of days but denies any fever chills,rigors.  6/15 Patient S/P Right  frontal parietal craniotomy with evacuation of subdural hematoma. Placement of subdural drain by Neurosurgery.  6/17 postop day 3 right frontal craniotomy with evacuation of SDH, blurred vision and headache have resolved.    Review of Systems:  Review of Systems  Constitutional: Negative for fever, chills and malaise/fatigue.  HENT: Negative for ear discharge, ear pain, hearing loss, sore throat and tinnitus.   Eyes: Positive for blurred vision. Negative for double vision, photophobia and pain.  Respiratory: Negative for cough and wheezing.   Cardiovascular: Negative for chest pain and leg swelling.  Gastrointestinal: Negative for abdominal pain.  Musculoskeletal: Negative for neck pain.  Skin: Negative for itching and rash.  Neurological: Positive for headaches. Negative for dizziness, sensory change, speech change, focal weakness and weakness.  Psychiatric/Behavioral: Negative for depression, hallucinations and memory loss.     Past Medical History: Past Medical History  Diagnosis Date  . CKD III-IV     a. since transplant in 2001 with significant worsening since 06/2013.  Marland Kitchen Hypertension     a. Long h/o HTN, came off of all meds following transplant/wt loss-->meds resumed 06/2013, difficult to control since.  Marland Kitchen History of stroke   . Hypercalcemia     a. 06/2013 Hospitalized in [place].  Marland Kitchen Heart transplanted ([place])     a. 1995 CABG x 4 in [place], [place];  b. 2001 Developed CHF;  c. 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 10/2000 Cardiac transplant @ [facility], [place];  e. Historically nl  Bx and stress testing.  Last stress test ~ 5 yrs ago.  Marland Kitchen Hernia of abdominal wall   . Hyperlipidemia   . Orthostatic hypotension     a. Has tried support hose and compression stockings - prefers not to wear.  . Coronary artery disease   . Myocardial infarction ([place])     PRIOR TO HEART TRANSPLANT  . Shortness of breath     EXERTION  . Orthostatic dizziness   . Stroke ([place]) 06/2013    NO RESIDUAL - WAS  CONFUSED AT TIME - THAT HAS RESOLVED  . Diabetes mellitus without complication (HCC)     FASTING CBG 90S  . Headache(784.0)   . Cancer ([place])     SKIN  . Anemia   . History of blood transfusion   . Dementia     mild    Past Surgical History: Past Surgical History  Procedure Laterality Date  . Cardiac surgery    . Heart transplant    . Cataract extraction    . Vasectomy    . Skin graft full thickness leg      cat scratch that did not heal  . Coronary artery bypass graft    . Eye surgery Bilateral     CATARACTS  . Bascilic vein transposition Left 07/06/2014    Procedure: [place];  Surgeon: Rosetta Posner, MD;  Location: [facility] OR;  Service: Vascular;  Laterality: Left;  . Craniotomy Right 05/04/2016    Procedure: Right Craniotomy for Evacuation of Subdural Hematoma;  Surgeon: Earnie Larsson, MD;  Location: [facility] NEURO ORS;  Service: Neurosurgery;  Laterality: Right;     Allergies:   Allergies  Allergen Reactions  . Amlodipine Swelling    Swelling of the lower body    . Norvasc [Amlodipine Besylate] Swelling and Other (See Comments)    Swelling of the lower body   . Ace Inhibitors Other (See Comments)    Dialysis  . Angiotensin Receptor Blockers Other (See Comments)    Dialysis     Social History:  reports that he has never smoked. He has never used smokeless tobacco. He reports that he does not drink alcohol or use illicit drugs.   Family History: Family History  Problem Relation Age of Onset  . Heart attack Father     died @ 67  . Diabetes Father   . Heart attack Mother   . Diabetes Mother   . Kidney disease Maternal Aunt       Procedures/Significant Events:  6/13 CT head WO contrast;-Large mixed density subdural hematoma on the right compatible with acute on chronic SDH. Present on prior CT. 5 mm midline shift to the left. 6/14 CT head WO contrast;-Stable large RIGHT mixed density SDH resulting in 4 mm RIGHT to LEFT midline shift. No ventricular  entrapment 6/15 Right frontal parietal craniotomy with evacuation of subdural hematoma. Placement of subdural drain 6/16 Echocardiogram;- Left ventricle: mild LVH. -LVEF= 60% to 65%. - Left atrium:  moderately dilated.   Cultures 6/14 MRSA by PCR negative  Antimicrobials: Ancef 6/15>>   Devices    LINES / TUBES:  Right IJ 6/15>> Right radial art line 6/15>> Left upper arm AV fistula>>    Continuous Infusions:    Physical Exam: Filed Vitals:   05/06/16 1814 05/06/16 2100 05/07/16 0232 05/07/16 0535  BP: 133/49 149/54 139/43 137/59  Pulse: 73 71 61 67  Temp: 98.2 F (36.8 C) 97.7 F (36.5 C) 98.2 F (36.8 C) 98.4 F (36.9 C)  TempSrc: Oral Oral Oral Oral  Resp: 16 16 16 18   Height:      Weight:      SpO2: 97% 100% 100% 100%    NO CHARGE    Data reviewed:  I have personally reviewed following labs and imaging studies Labs:  CBC:  Recent Labs Lab 05/03/16 0627 05/04/16 1355 05/04/16 1930 05/05/16 0630 05/06/16 1525  WBC 7.5 8.9 9.2 8.0 12.9*  HGB 10.7* 9.7* 9.5* 9.4* 8.9*  HCT 34.7* 32.3* 31.5* 31.6* 29.5*  MCV 100.3* 99.7 99.1 99.1 100.0  PLT 250 207 221 225 [id number]    Basic Metabolic Panel:  Recent Labs Lab 05/02/16 1930  05/03/16 1811 05/04/16 0239  05/04/16 1931 05/05/16 0630 05/06/16 0930 05/06/16 1539 05/07/16 0449  NA  --   < > 136 135  < > 133* 133* 136 137 135  K  --   < > 3.7 4.2  < > 4.3 4.5 3.6 3.5 3.7  CL  --   < > 96* 96*  < > 97* 97* 99* 100* 97*  CO2  --   < > 29 28  < > 23 23 28 30 29   GLUCOSE  --   < > 116* 123*  < > 149* 215* 158* 136* 142*  BUN  --   < > 10 16  < > 22* 29* 21* 9 26*  CREATININE  --   < > 3.30* 4.58*  < > 5.51* 6.10* 3.75* 2.05* 3.43*  CALCIUM  --   < > 8.7* 8.5*  < > 8.3* 8.3* 8.3* 8.1* 8.1*  MG 2.0  --   --  2.0  --   --   --   --   --   --   PHOS  --   --  2.5  --   --  4.6  --  4.5 2.9  --   < > = values in this interval not displayed. GFR Estimated Creatinine Clearance: 18.3 mL/min (by C-G  formula based on Cr of 3.43). Liver Function Tests:  Recent Labs Lab 05/02/16 1930 05/03/16 0627 05/03/16 1811 05/04/16 1931 05/06/16 0930 05/06/16 1539  AST 14* 13*  --   --   --   --   ALT 12* 12*  --   --   --   --   ALKPHOS 111 125  --   --   --   --   BILITOT 0.6 0.6  --   --   --   --   PROT 6.0* 5.9*  --   --   --   --   ALBUMIN 3.3* 3.2* 3.2* 2.9* 2.8* 2.9*   No results for input(s): LIPASE, AMYLASE in the last 168 hours. No results for input(s): AMMONIA in the last 168 hours. Coagulation profile  Recent Labs Lab 05/04/16 0239  INR 1.19    Cardiac Enzymes:  Recent Labs Lab 05/02/16 1930 05/03/16 0022 05/03/16 0554  TROPONINI <0.03 <0.03 <0.03   BNP: Invalid input(s): POCBNP CBG:  Recent Labs Lab 05/02/16 1523 05/03/16 1654 05/04/16 0939  GLUCAP 116* 123* 83   D-Dimer No results for input(s): DDIMER in the last 72 hours. Hgb A1c No results for input(s): HGBA1C in the last 72 hours. Lipid Profile No results for input(s): CHOL, HDL, LDLCALC, TRIG, CHOLHDL, LDLDIRECT in the last 72 hours. Thyroid function studies No results for input(s): TSH, T4TOTAL, T3FREE, THYROIDAB in the last 72 hours.  Invalid input(s): FREET3 Anemia work up No results  for input(s): VITAMINB12, FOLATE, FERRITIN, TIBC, IRON, RETICCTPCT in the last 72 hours. Urinalysis    Component Value Date/Time   COLORURINE RED* 04/22/2015 1952   APPEARANCEUR TURBID* 04/22/2015 1952   LABSPEC 1.016 04/22/2015 1952   PHURINE 7.0 04/22/2015 1952   GLUCOSEU NEGATIVE 04/22/2015 1952   HGBUR LARGE* 04/22/2015 [place] NEGATIVE 04/22/2015 [place] NEGATIVE 04/22/2015 1952   PROTEINUR >300* 04/22/2015 1952   UROBILINOGEN 1.0 04/22/2015 1952   NITRITE NEGATIVE 04/22/2015 1952   LEUKOCYTESUR LARGE* 04/22/2015 1952     Microbiology Recent Results (from the past 240 hour(s))  MRSA PCR Screening     Status: None   Collection Time: 05/03/16  1:15 AM  Result Value Ref  Range Status   MRSA by PCR NEGATIVE NEGATIVE Final    Comment:        The GeneXpert MRSA Assay (FDA approved for NASAL specimens only), is one component of a comprehensive MRSA colonization surveillance program. It is not intended to diagnose MRSA infection nor to guide or monitor treatment for MRSA infections.        Inpatient Medications:   Scheduled Meds: . aspirin  81 mg Oral QHS  . cycloSPORINE modified  50 mg Oral BID  . [START ON 05/09/2016] darbepoetin (ARANESP) injection - DIALYSIS  100 mcg Intravenous Q Tue-HD  . dexamethasone  4 mg Intravenous Q8H  . donepezil  10 mg Oral QHS  . doxercalciferol  2 mcg Intravenous Q T,Th,Sa-HD  . famotidine  20 mg Oral Daily  . feeding supplement (PRO-STAT SUGAR FREE 64)  30 mL Oral BID  . [START ON 05/09/2016] ferric gluconate (FERRLECIT/NULECIT) IV  62.5 mg Intravenous Q Tue-HD  . FLUoxetine  20 mg Oral Q breakfast  . hydrALAZINE  50 mg Oral TID  . levETIRAcetam  500 mg Intravenous Q12H  . levothyroxine  75 mcg Oral QAC breakfast  . metoprolol tartrate  25 mg Oral BID  . multivitamin  1 tablet Oral q morning - 10a  . mycophenolate  250 mg Oral QHS  . mycophenolate  500 mg Oral q morning - 10a  . pantoprazole  40 mg Oral Daily  . pravastatin  10 mg Oral QHS  . sodium chloride flush  3 mL Intravenous Q12H  . sodium chloride flush  3 mL Intravenous Q12H  . vitamin B-12  1,000 mcg Oral Q breakfast   Continuous Infusions:    Radiological Exams on Admission: No results found.  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 Deri Fuelling Neurosurgery performed right sided craniotomy with evacuation on 6/15 -6/17 spoke with Dr. Vertell Limber neurosurgery No counterindication for mobilization.  -Consult to PT/OT placed; Recommend CIR -Consult to inpatient rehabilitation placed on 6/18 -Ambulate with assistance q shift -Continue management per neurosurgery   Heart transplanted/mild Cardiomyopathy  -Cyclosporine 50 mg BID -Mycophenolate 500 mg qAm/ 250 mg QHS -Cyclosporine and CellCept levels pending -6/16 Echocardiogram; shows cardiac function improved when compared to echocardiogram from 07/01/2014 see results below  Hypertension -Allow for  permissive hypertension secondary to SDH; SBP goal 145-170; DBP goal<105 -Hydralazine 50 mg QID  -Metoprolol 25 mg BID   DM (diabetes mellitus), type 2 controlled with complications  -0000000 hemoglobin A1c= 5.5 -Will start the patient on sliding scale insulin  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 Lorrene Reid. They will see him tomorrow morning, no indication for urgent dialysis, electrolytes reviewed  Anemia of chronic disease -likely anemia secondary to chronic kidney disease, stable  Depression -Prozac 20 mg daily   Dementia  -Aricept 10 mg QHS    Thank you for this consultation.  Our [facility] hospitalist team will follow the patient with you.   Time Spent: 40 minutes  WOODS, Geraldo Docker M.D. [facility] 05/07/2016, 8:04 AM

## 2016-05-08 ENCOUNTER — Encounter (HOSPITAL_COMMUNITY): Payer: Self-pay | Admitting: Neurosurgery

## 2016-05-08 ENCOUNTER — Ambulatory Visit: Payer: Medicare HMO | Admitting: Physical Therapy

## 2016-05-08 DIAGNOSIS — I429 Cardiomyopathy, unspecified: Secondary | ICD-10-CM

## 2016-05-08 DIAGNOSIS — I10 Essential (primary) hypertension: Secondary | ICD-10-CM

## 2016-05-08 DIAGNOSIS — I6201 Nontraumatic acute subdural hemorrhage: Secondary | ICD-10-CM

## 2016-05-08 DIAGNOSIS — D62 Acute posthemorrhagic anemia: Secondary | ICD-10-CM

## 2016-05-08 DIAGNOSIS — E118 Type 2 diabetes mellitus with unspecified complications: Secondary | ICD-10-CM

## 2016-05-08 DIAGNOSIS — Z992 Dependence on renal dialysis: Secondary | ICD-10-CM

## 2016-05-08 DIAGNOSIS — D72829 Elevated white blood cell count, unspecified: Secondary | ICD-10-CM

## 2016-05-08 DIAGNOSIS — F0280 Dementia in other diseases classified elsewhere without behavioral disturbance: Secondary | ICD-10-CM

## 2016-05-08 DIAGNOSIS — E1121 Type 2 diabetes mellitus with diabetic nephropathy: Secondary | ICD-10-CM

## 2016-05-08 DIAGNOSIS — N186 End stage renal disease: Secondary | ICD-10-CM

## 2016-05-08 DIAGNOSIS — Z941 Heart transplant status: Secondary | ICD-10-CM

## 2016-05-08 DIAGNOSIS — F329 Major depressive disorder, single episode, unspecified: Secondary | ICD-10-CM

## 2016-05-08 DIAGNOSIS — D638 Anemia in other chronic diseases classified elsewhere: Secondary | ICD-10-CM

## 2016-05-08 LAB — GLUCOSE, CAPILLARY: Glucose-Capillary: 271 mg/dL — ABNORMAL HIGH (ref 65–99)

## 2016-05-08 MED ORDER — SORBITOL 70 % SOLN
30.0000 mL | Freq: Once | Status: AC
  Administered 2016-05-08: 30 mL via ORAL
  Filled 2016-05-08: qty 30

## 2016-05-08 MED ORDER — SORBITOL 70 % SOLN: 30.0000 mL | Freq: Three times a day (TID) | Status: DC | PRN

## 2016-05-08 MED ORDER — HYDRALAZINE HCL 50 MG PO TABS
100.0000 mg | ORAL_TABLET | Freq: Three times a day (TID) | ORAL | Status: DC
  Administered 2016-05-08 – 2016-05-10 (×5): 100 mg via ORAL
  Filled 2016-05-08 (×5): qty 2

## 2016-05-08 MED ORDER — LABETALOL HCL 100 MG PO TABS
100.0000 mg | ORAL_TABLET | Freq: Two times a day (BID) | ORAL | Status: DC
  Administered 2016-05-08 – 2016-05-10 (×4): 100 mg via ORAL
  Filled 2016-05-08 (×4): qty 1

## 2016-05-08 MED ORDER — INSULIN ASPART 100 UNIT/ML ~~LOC~~ SOLN
0.0000 [IU] | Freq: Three times a day (TID) | SUBCUTANEOUS | Status: DC
  Administered 2016-05-09 – 2016-05-10 (×2): 5 [IU] via SUBCUTANEOUS
  Administered 2016-05-10: 3 [IU] via SUBCUTANEOUS

## 2016-05-08 MED ORDER — INSULIN ASPART 100 UNIT/ML ~~LOC~~ SOLN
0.0000 [IU] | Freq: Every day | SUBCUTANEOUS | Status: DC
Start: 2016-05-08 — End: 2016-05-10
  Administered 2016-05-08: 3 [IU] via SUBCUTANEOUS
  Administered 2016-05-09: 2 [IU] via SUBCUTANEOUS

## 2016-05-08 MED ORDER — DOCUSATE SODIUM 100 MG PO CAPS
100.0000 mg | ORAL_CAPSULE | Freq: Every morning | ORAL | Status: DC
  Administered 2016-05-08 – 2016-05-10 (×3): 100 mg via ORAL
  Filled 2016-05-08 (×3): qty 1

## 2016-05-08 NOTE — Progress Notes (Signed)
Rehab admissions - I am following for potential acute inpatient rehab admission.  Please see rehab consult done by Dr. Posey Pronto today.  I will follow up with patient/family in am.  [phone]

## 2016-05-08 NOTE — Care Management Note (Signed)
Case Management Note  Patient Details  Name: [name] [name] MRN: [id number] Date of Birth: [birth year]  Subjective/Objective:                    Action/Plan: PT/OT recs are for CIR. CM following for d/c needs.   Expected Discharge Date:  05/07/16               Expected Discharge Plan:  [place]  In-House Referral:  NA  Discharge planning Services  CM Consult  Post Acute Care Choice:  Home Health Choice offered to:  Spouse  DME Arranged:  N/A DME Agency:  NA  HH Arranged:  RN, PT, Nurse's Aide [place] Agency:  [place]  Status of Service:  Completed, signed off  Medicare Important Message Given:    Date Medicare IM Given:    Medicare IM give by:    Date Additional Medicare IM Given:    Additional Medicare Important Message give by:     If discussed at [place] of Stay Meetings, dates discussed:    Additional Comments:  Pollie Friar, RN 05/08/2016, 11:10 AM

## 2016-05-08 NOTE — Progress Notes (Signed)
Occupational Therapy Treatment Patient Details Name: [name] [name] MRN: [id number] DOB: 1941/09/23 Today's Date: 05/08/2016    History of present illness 75 y.o. [name] 75 year old WM PMHx Dementia, CVA, ESRD on HD T/Th/Sat at [facility], Cardiac transplant in 1995, followed at [facility], HTN, MI (prior to heart transplant), HLD ,DM Type 2 with complication, skin cancer.  Who presents with increased weakness and a fall with resultant SDH. Patient S/P Right frontal parietal craniotomy with evacuation of subdural hematoma. Placement of subdural drain by Neurosurgery.Niel Hummer removed 6/16.   OT comments  Pt making good progress toward OT goals; much clearer cognitively today. Pt able to perform functional mobility in room with min assist; pt aware of L LE weakness and buckling requesting to sit down to perform grooming. Pt able to complete grooming activities sitting at the sink with selective attention. Continue to recommend CIR for follow up. Will continue to follow pt acutely.   Follow Up Recommendations  CIR;Supervision/Assistance - 24 hour    Equipment Recommendations  Other (comment) (TBD at next venue)    Recommendations for Other Services      Precautions / Restrictions Precautions Precautions: Fall Restrictions Weight Bearing Restrictions: No       Mobility Bed Mobility Overal bed mobility: Needs Assistance Bed Mobility: Supine to Sit     Supine to sit: Min guard     General bed mobility comments: Pt OOB in chair upon arrival  Transfers Overall transfer level: Needs assistance Equipment used: Rolling walker (2 wheeled) Transfers: Sit to/from Stand Sit to Stand: Min assist         General transfer comment: Min guard for initial sit to stand from chair, with fatigue pt required min assist for sit to stand from EOB. Good hand placement and technique.    Balance Overall balance assessment: History of Falls;Needs  assistance Sitting-balance support: No upper extremity supported;Feet supported Sitting balance-Leahy Scale: Good     Standing balance support: Bilateral upper extremity supported;During functional activity Standing balance-Leahy Scale: Poor                     ADL Overall ADL's : Needs assistance/impaired Eating/Feeding: Set up;Sitting   Grooming: Set up;Sitting;Oral care;Wash/dry face;Wash/dry hands (shaving) Grooming Details (indicate cue type and reason): Pt able to perform functional mobiltiy to sink then requesing to sit down to complete grooming tasks. Reports his L leg is weak and he is prone to falls; would rather be safe and sit during grooming.                             Functional mobility during ADLs: Minimal assistance;Rolling walker General ADL Comments: Pt with weakness noted during mobility at times requiring min assist for balance.       Vision                     Perception     Praxis      Cognition   Behavior During Therapy: [facility] for tasks assessed/performed Overall Cognitive Status: History of cognitive impairments - at baseline Area of Impairment: Attention;Awareness;Problem solving   Current Attention Level: Selective    Following Commands: Follows one step commands consistently;Follows multi-step commands with increased time   Awareness: Emergent (At times anticipatory) Problem Solving: Difficulty sequencing (Intermittetly) General Comments: Improved cognition from last session.     Extremity/Trunk Assessment  Exercises   Shoulder Instructions       General Comments      Pertinent Vitals/ Pain       Pain Assessment: No/denies pain  Home Living                                          Prior Functioning/Environment              Frequency Min 2X/week     Progress Toward Goals  OT Goals(current goals can now be found in the care plan section)  Progress towards OT  goals: Progressing toward goals  Acute Rehab OT Goals Patient Stated Goal: get better, no more falls OT Goal Formulation: With patient  Plan Discharge plan remains appropriate    Co-evaluation                 End of Session Equipment Utilized During Treatment: Gait belt;Rolling walker   Activity Tolerance Patient tolerated treatment well   Patient Left in chair;with call bell/phone within reach;with chair alarm set   Nurse Communication Mobility status        Time: [id number] OT Time Calculation (min): 38 min  Charges: OT General Charges $OT Visit: 1 Procedure OT Treatments $Self Care/Home Management : 38-52 mins  Binnie Kand M.S., OTR/L Pager: [phone]  05/08/2016, 9:42 AM

## 2016-05-08 NOTE — Progress Notes (Deleted)
PT/OT Cancellation Note  Patient Details Name: Asahd Leisinger MRN: [id number] DOB: Oct 19, 1941   Cancelled Treatment:    Reason Eval/Treat Not Completed: Patient at procedure or test/unavailable. Pt leaving for TEE. PT/OT to return as able.   Kingsley Callander 05/08/2016, 9:01 AM   Kittie Plater, PT, DPT Pager #: [phone] Office #: [phone]

## 2016-05-08 NOTE — Progress Notes (Addendum)
[facility] Rounding Note Subjective:    No cos / "want to do rehab here" Wife says she notices he has to be cued so as not to drag his foot  Objective Vital signs in last 24 hours: Filed Vitals:   05/07/16 2145 05/08/16 0210 05/08/16 0616 05/08/16 1008  BP: 115/67 144/88 129/76 197/70  Pulse: 74 69 62 67  Temp: 98.2 F (36.8 C) 98.5 F (36.9 C) 98.4 F (36.9 C) 97.9 F (36.6 C)  TempSrc: Oral Oral Oral Oral  Resp: 18 18 18 20   Height:      Weight:      SpO2: 100% 99% 100% 100%   Weight change:   Physical Exam: General:  Alert , NAD OX3  Staple line from right craniotomy intact. Incision dry Speech fine Heart: Regular S1S2 normal, no S3 or S4 Lungs:  CTA  bilat  Abdomen: soft NT/ ND Extremities: no LE edema Dialysis Access: Left upper AVF pos bruit  With ace wrap     Recent Labs Lab 05/04/16 1931  05/06/16 0930 05/06/16 1539 05/07/16 0449  NA 133*  < > 136 137 135  K 4.3  < > 3.6 3.5 3.7  CL 97*  < > 99* 100* 97*  CO2 23  < > 28 30 29   GLUCOSE 149*  < > 158* 136* 142*  BUN 22*  < > 21* 9 26*  CREATININE 5.51*  < > 3.75* 2.05* 3.43*  CALCIUM 8.3*  < > 8.3* 8.1* 8.1*  PHOS 4.6  --  4.5 2.9  --   < > = values in this interval not displayed.   Recent Labs Lab 05/02/16 1930 05/03/16 0627  05/04/16 1931 05/06/16 0930 05/06/16 1539  AST 14* 13*  --   --   --   --   ALT 12* 12*  --   --   --   --   ALKPHOS 111 125  --   --   --   --   BILITOT 0.6 0.6  --   --   --   --   PROT 6.0* 5.9*  --   --   --   --   ALBUMIN 3.3* 3.2*  < > 2.9* 2.8* 2.9*  < > = values in this interval not displayed. No results for input(s): LIPASE, AMYLASE in the last 168 hours. No results for input(s): AMMONIA in the last 168 hours.   Recent Labs Lab 05/03/16 0627 05/04/16 1355 05/04/16 1930 05/05/16 0630 05/06/16 1525  WBC 7.5 8.9 9.2 8.0 12.9*  HGB 10.7* 9.7* 9.5* 9.4* 8.9*  HCT 34.7* 32.3* 31.5* 31.6* 29.5*  MCV 100.3* 99.7 99.1 99.1 100.0  PLT 250 207 221 225 225      Recent Labs Lab 05/02/16 1930 05/03/16 0022 05/03/16 0554  TROPONINI <0.03 <0.03 <0.03     Recent Labs Lab 05/02/16 1523 05/03/16 1654 05/04/16 0939  GLUCAP 116* 123* 83    Medications:   . aspirin  81 mg Oral QHS  . cycloSPORINE modified  50 mg Oral BID  . [START ON 05/09/2016] darbepoetin (ARANESP) injection - DIALYSIS  100 mcg Intravenous Q Tue-HD  . dexamethasone  4 mg Intravenous Q8H  . donepezil  10 mg Oral QHS  . doxercalciferol  2 mcg Intravenous Q T,Th,Sa-HD  . famotidine  20 mg Oral Daily  . feeding supplement (PRO-STAT SUGAR FREE 64)  30 mL Oral BID  . [START ON 05/09/2016] ferric gluconate (FERRLECIT/NULECIT) IV  62.5 mg Intravenous Q  Tue-HD  . FLUoxetine  20 mg Oral Q breakfast  . hydrALAZINE  50 mg Oral Q6H  . levETIRAcetam  500 mg Oral BID  . levothyroxine  75 mcg Oral QAC breakfast  . metoprolol tartrate  25 mg Oral BID  . multivitamin  1 tablet Oral q morning - 10a  . mycophenolate  250 mg Oral QHS  . mycophenolate  500 mg Oral q morning - 10a  . pantoprazole  40 mg Oral Daily  . pravastatin  10 mg Oral QHS  . sodium chloride flush  3 mL Intravenous Q12H  . sodium chloride flush  3 mL Intravenous Q12H  . vitamin B-12  1,000 mcg Oral Q breakfast   OP Dialysis: SW TTS 4h 69.5kg 2/2.25 bath P2 Hep none LUA AVF Hectorol 2 mcg IV q TTS (last PTH 297 03/16/16) Mircera 100 mcg IV Q 2 weeks (Last dose 04/27/16 HGB 11.0) Venofer 50 mg IV (last dose 04/29/16 Fe 83 Tsat 33% 04/13/16)   Problem/Plan:  1. R SDH / SP craniotomy: Per primary/Neurosurgery/ [facility] /Doing well 2. ESRD - TTS HD, no hep/ 3.7 k  Use 3.0 k bath fu lab pre hd  3. Hypertension - BP's higher side of normal, on metop/ hydral 4. Volume - has been having orthostatic hypotension at home for years, started after his heart transplant; at dry wt last wt on 17th  / No  uf tomor hd  5. Anemia - HGB 9.4>8.9 - Mircera 100 given 6/8 - prior outpt hgb was 11-   [facility] ordered  Aranesp 100 to start Tues HD and IV Fe q tues HD 6. Metabolic bone disease - P 4.6> 2.9  off binders -on VDRA  7. Nutrition -Albumin 2.9 change to renal diet, renal vit/prostat 8. S/P cardiac transplant Immunosuppressant therapy: per primary cont cyclosporine, mycophenolate    Ernest Haber, PA-C [place] [phone] 05/08/2016,10:32 AM LOS: 6 days   I have seen and examined this patient and agree with plan and assessment in the above note with highlighted additions. Recovering from craniotomy. For HD tomorrow. Wife is hoping for rehab here,  then [facility] ([facility] their home). Cyncere Sontag B,MD 05/08/2016 1:45 PM

## 2016-05-08 NOTE — OR Nursing (Signed)
Addendum created to document correct Recovery Care Complete time in event log

## 2016-05-08 NOTE — Progress Notes (Signed)
Physical Therapy Treatment Patient Details Name: [name] [name] MRN: [id number] DOB: 1940-12-12 Today's Date: 05/08/2016    History of Present Illness 75 y.o. male 75 year old WM PMHx Dementia, CVA, ESRD on HD T/Th/Sat at [facility], Cardiac transplant in 1995, followed at [facility], HTN, MI (prior to heart transplant), HLD ,DM Type 2 with complication, skin cancer.  Who presents with increased weakness and a fall with resultant SDH. Patient S/P Right frontal parietal craniotomy with evacuation of subdural hematoma. Placement of subdural drain by Neurosurgery.[name] [name] removed 6/16.    PT Comments    Pt progressing well and has shown significant improvement both functionally and physically compared to last treatment. Pt able to ambulate this date but con't to show bilat LE weakness L  > R, mild ataxia, impaired balance, and delayed processing. Pt to strongly benefit from intense rehab program like CIR upon d/c to achieve maximal functional recovery.  Follow Up Recommendations  CIR;Supervision/Assistance - 24 hour     Equipment Recommendations   (TBD)    Recommendations for Other Services Rehab consult     Precautions / Restrictions Precautions Precautions: Fall Restrictions Weight Bearing Restrictions: No    Mobility  Bed Mobility Overal bed mobility: Needs Assistance Bed Mobility: Supine to Sit     Supine to sit: Min guard     General bed mobility comments: pt used log roll technique and use of bed rail, increased time, mildly labored effort  Transfers Overall transfer level: Needs assistance Equipment used: Rolling walker (2 wheeled) Transfers: Sit to/from Stand Sit to Stand: Min assist;Mod assist         General transfer comment: max directional v/c's for safe hand placement, increased time, tactile cues at chest and posterior hips to achieve trunk and hip extension  Ambulation/Gait Ambulation/Gait assistance: Min assist;+2  safety/equipment;Mod assist (for chair follow) Ambulation Distance (Feet): 100 Feet Assistive device: Rolling walker (2 wheeled) Gait Pattern/deviations: Step-through pattern;Decreased stride length;Decreased stance time - left;Decreased step length - left;Ataxic;Trunk flexed;Narrow base of support Gait velocity: slow, yet improved from last session Gait velocity interpretation: Below normal speed for age/gender General Gait Details: initially bilat HHA then transitioned to RW. Pt required tactile cues at R hip to promote stability to prevent buckling and allow for increased L LE step length. pt with L LE decreased step height   Stairs            Wheelchair Mobility    Modified Rankin (Stroke Patients Only) Modified Rankin (Stroke Patients Only) Pre-Morbid Rankin Score: No symptoms Modified Rankin: Moderately severe disability     Balance Overall balance assessment: History of Falls                                  Cognition Arousal/Alertness: Awake/alert Behavior During Therapy: WFL for tasks assessed/performed Overall Cognitive Status: History of cognitive impairments - at baseline Area of Impairment: Following commands   Current Attention Level: Selective   Following Commands: Follows one step commands consistently;Follows multi-step commands with increased time   Awareness: Emergent Problem Solving: Difficulty sequencing General Comments: pt much improved from initial eval    Exercises Other Exercises Other Exercises: worked on SLS on both L and R, 10 reps x 5 sec with unilateral UE support Other Exercises: worked on squats with increased WBing on LLE (pretend like you're going to sit and then stand right back up" pt had to pull up with hands despite  encouragement not too    General Comments        Pertinent Vitals/Pain Pain Assessment: No/denies pain    Home Living                      Prior Function            PT Goals  (current goals can now be found in the care plan section) Acute Rehab PT Goals Patient Stated Goal: go home Progress towards PT goals: Progressing toward goals    Frequency  Min 4X/week    PT Plan Current plan remains appropriate    Co-evaluation             End of Session Equipment Utilized During Treatment: Gait belt Activity Tolerance: Patient tolerated treatment well;Patient limited by fatigue Patient left: in chair;with call bell/phone within reach;with chair alarm set     Time: 0826-0850 PT Time Calculation (min) (ACUTE ONLY): 24 min  Charges:  $Gait Training: 8-22 mins $Therapeutic Exercise: 8-22 mins                    G Codes:      [name] [name] 05/08/2016, 9:11 AM   [name] [name], PT, DPT Pager #: [phone] Office #: [phone]

## 2016-05-08 NOTE — Consult Note (Signed)
Physical Medicine and Rehabilitation Consult Reason for Consult: Traumatic Subdural hematoma status post craniotomy Referring Physician: Dr. Annette Stable   HPI: [name] [name] is a 75 y.o. right handed male with history of end-stage renal disease with hemodialysis, cardiac transplant 1995 followed by [facility], diabetes mellitus, dementia, hypertension. Per chart review patient lives with spouse and has a son close by in the same apartment complex. Used a walker prior to admission. One level home 3 steps to entry. Presented 05/02/2016 with progressive weakness over the past 6 weeks and falls 3 associated with right-sided headaches. CT imaging revealed right convex of the chronic subdural hematoma with some mixed acute posttraumatic subdural hematoma. Underwent right frontoparietal craniotomy evacuation of subdural hematoma placement of subdural drain 05/04/2016 per Dr. Annette Stable. Keppra for seizure prophylaxis. Decadron protocol as directed. Hemodialysis ongoing as per renal services. Vascular surgery consult of 05/06/2016 secondary to some bleeding from AV access site and advised to continue to monitor for now. Physical and occupational therapy evaluation completed with recommendations of physical medicine rehabilitation consult.   Review of Systems  Constitutional: Negative for fever and chills.  HENT: Negative for hearing loss.   Eyes: Positive for blurred vision. Negative for double vision.  Respiratory: Positive for shortness of breath. Negative for cough.   Cardiovascular: Negative for chest pain and palpitations.  Gastrointestinal: Negative for nausea and vomiting.  Musculoskeletal: Positive for falls.  Neurological: Positive for weakness and headaches. Negative for seizures.  Psychiatric/Behavioral: Positive for memory loss.  All other systems reviewed and are negative.  Past Medical History  Diagnosis Date  . CKD III-IV     a. since transplant in 2001 with significant worsening  since 06/2013.  Marland Kitchen Hypertension     a. Long h/o HTN, came off of all meds following transplant/wt loss-->meds resumed 06/2013, difficult to control since.  Marland Kitchen History of stroke   . Hypercalcemia     a. 06/2013 Hospitalized in [place].  Marland Kitchen Heart transplanted ([place])     a. 1995 CABG x 4 in [place], [place];  b. 2001 Developed CHF;  c. 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 10/2000 Cardiac transplant @ [facility], [place];  e. Historically nl Bx and stress testing.  Last stress test ~ 5 yrs ago.  Marland Kitchen Hernia of abdominal wall   . Hyperlipidemia   . Orthostatic hypotension     a. Has tried support hose and compression stockings - prefers not to wear.  . Coronary artery disease   . Myocardial infarction ([place])     PRIOR TO HEART TRANSPLANT  . Shortness of breath     EXERTION  . Orthostatic dizziness   . Stroke ([place]) 06/2013    NO RESIDUAL - WAS CONFUSED AT TIME - THAT HAS RESOLVED  . Diabetes mellitus without complication (HCC)     FASTING CBG 90S  . Headache(784.0)   . Cancer ([place])     SKIN  . Anemia   . History of blood transfusion   . Dementia     mild   Past Surgical History  Procedure Laterality Date  . Cardiac surgery    . Heart transplant    . Cataract extraction    . Vasectomy    . Skin graft full thickness leg      cat scratch that did not heal  . Coronary artery bypass graft    . Eye surgery Bilateral     CATARACTS  . Bascilic vein transposition Left 07/06/2014    Procedure: BASCILIC VEIN  TRANSPOSITION;  Surgeon: Rosetta Posner, MD;  Location: [facility] OR;  Service: Vascular;  Laterality: Left;  . Craniotomy Right 05/04/2016    Procedure: Right Craniotomy for Evacuation of Subdural Hematoma;  Surgeon: Earnie Larsson, MD;  Location: [facility] NEURO ORS;  Service: Neurosurgery;  Laterality: Right;   Family History  Problem Relation Age of Onset  . Heart attack Father     died @ 82  . Diabetes Father   . Heart attack Mother   . Diabetes Mother   . Kidney disease Maternal Aunt    Social History:  reports  that he has never smoked. He has never used smokeless tobacco. He reports that he does not drink alcohol or use illicit drugs. Allergies:  Allergies  Allergen Reactions  . Amlodipine Swelling    Swelling of the lower body    . Norvasc [Amlodipine Besylate] Swelling and Other (See Comments)    Swelling of the lower body   . Ace Inhibitors Other (See Comments)    Dialysis  . Angiotensin Receptor Blockers Other (See Comments)    Dialysis   Medications Prior to Admission  Medication Sig Dispense Refill  . acetaminophen (TYLENOL) 325 MG tablet Take 2 tablets (650 mg total) by mouth every 6 (six) hours as needed for mild pain (or Fever >/= 101).    Marland Kitchen aspirin 81 MG chewable tablet Chew 81 mg by mouth at bedtime.    . cholecalciferol (VITAMIN D) 1000 UNITS tablet Take 1,000 Units by mouth daily.    . cycloSPORINE modified (NEORAL) 25 MG capsule Take 50 mg by mouth 2 (two) times daily.     . diphenoxylate-atropine (LOMOTIL) 2.5-0.025 MG per tablet Take 1 tablet by mouth 2 (two) times daily as needed for diarrhea or loose stools.     . donepezil (ARICEPT) 10 MG tablet Take 10 mg by mouth at bedtime.     Marland Kitchen FLUoxetine (PROZAC) 20 MG capsule Take 20 mg by mouth daily with breakfast.    . fluticasone (FLONASE) 50 MCG/ACT nasal spray Place 2 sprays into both nostrils at bedtime as needed for allergies.     . hydrALAZINE (APRESOLINE) 25 MG tablet Take 25 mg by mouth 3 (three) times daily. Take with 50 mg tablet to = 75 mg three (3) times a day    . hydrALAZINE (APRESOLINE) 50 MG tablet Take 50 mg by mouth 3 (three) times daily. Take with 25 mg tablet to = 75 mg three (3) times a day    . lansoprazole (PREVACID) 30 MG capsule Take 30 mg by mouth 4 (four) times a week. Pt takes on Sunday, Monday, Wednesday, and Friday. Does "NOT" take on dialysis days    . levothyroxine (SYNTHROID, LEVOTHROID) 75 MCG tablet Take 75 mcg by mouth daily before breakfast.    . metoprolol tartrate (LOPRESSOR) 25 MG tablet Take  2 tablets (50 mg total) by mouth 2 (two) times daily. (Patient taking differently: Take 25 mg by mouth 2 (two) times daily. ) 120 tablet 0  . multivitamin (RENA-VIT) TABS tablet Take 1 tablet by mouth every morning.   5  . mycophenolate (CELLCEPT) 250 MG capsule Take 250-500 mg by mouth 2 (two) times daily. 500 mg in the morning and 250 every evening    . pravastatin (PRAVACHOL) 10 MG tablet Take 10 mg by mouth at bedtime.     . Probiotic Product (PRO-BIOTIC BLEND) CAPS Take 1 capsule by mouth daily.    . vitamin B-12 (CYANOCOBALAMIN) 1000 MCG tablet Take 1,000  mcg by mouth daily with breakfast.      Home: Home Living Family/patient expects to be discharged to:: Private residence Living Arrangements: Spouse/significant other Available Help at Discharge: Family, Available 24 hours/day (son lives in same complex) Type of Home: Apartment Home Access: Stairs to enter Technical brewer of Steps: 3 Entrance Stairs-Rails: Right Home Layout: One level Bathroom Shower/Tub: Chiropodist: Standard Home Equipment: Environmental consultant - 4 wheels, Cane - single point, Bedside commode, Shower seat, Toilet riser, Grab bars - tub/shower, Industrial/product designer History: Prior Function Level of Independence: Needs assistance Gait / Transfers Assistance Needed: uses rollator at baseline, and on dialysis days uses w/c Functional Status:  Mobility: Bed Mobility Overal bed mobility: Needs Assistance Bed Mobility: Supine to Sit Supine to sit: Min assist General bed mobility comments: Min assist for safety and initiation of bed mobility, Cues for posture and positioning upon coming to EOB Transfers Overall transfer level: Needs assistance Equipment used: 2 person hand held assist Transfers: Sit to/from Stand, Stand Pivot Transfers Sit to Stand: Mod assist, +2 physical assistance Stand pivot transfers: Mod assist, +2 physical assistance Ambulation/Gait General Gait Details: not safe to  perform    ADL: ADL Overall ADL's : Needs assistance/impaired Eating/Feeding: Set up, Sitting Grooming: Min guard, Sitting Upper Body Bathing: Min guard, Sitting Lower Body Bathing: Minimal assistance, Sit to/from stand Upper Body Dressing : Min guard, Sitting Upper Body Dressing Details (indicate cue type and reason): to don hospital gown Lower Body Dressing: Minimal assistance, Sit to/from stand Lower Body Dressing Details (indicate cue type and reason): Pt able to pull up socks sitting EOB with min guard assist but anticipate he would require assist to pull up pants/underwear once in standing. Toilet Transfer: Moderate assistance, +2 for physical assistance, Stand-pivot, BSC Toilet Transfer Details (indicate cue type and reason): Simulated by stand pivot from EOB to chair Toileting- Clothing Manipulation and Hygiene: Moderate assistance, Sit to/from stand Functional mobility during ADLs: Moderate assistance, +2 for physical assistance (for stand pivot only) General ADL Comments: Pt and wife report extensive hx of falls at home.  Cognition: Cognition Overall Cognitive Status: Impaired/Different from baseline Orientation Level: Oriented to person, Oriented to place, Oriented to situation, Disoriented to time Cognition Arousal/Alertness: Awake/alert Behavior During Therapy: WFL for tasks assessed/performed Overall Cognitive Status: Impaired/Different from baseline Area of Impairment: Attention, Following commands, Safety/judgement, Awareness, Problem solving Current Attention Level: Sustained Memory: Decreased short-term memory Following Commands: Follows one step commands with increased time Safety/Judgement: Decreased awareness of safety, Decreased awareness of deficits Awareness: Intellectual Problem Solving: Slow processing, Decreased initiation, Difficulty sequencing, Requires verbal cues, Requires tactile cues  Blood pressure 129/76, pulse 62, temperature 98.4 F (36.9 C),  temperature source Oral, resp. rate 18, height 6\' 2"  (1.88 m), weight 69.7 kg (153 lb 10.6 oz), SpO2 100 %. Physical Exam  Vitals reviewed. Constitutional: He is oriented to person, place, and time. He appears well-developed and well-nourished.  HENT:  Head: Normocephalic.  Staples to right scalp  Eyes: Conjunctivae and EOM are normal.  Neck: Normal range of motion. Neck supple. No thyromegaly present.  Cardiovascular: Normal rate and regular rhythm.   Respiratory: Effort normal and breath sounds normal. No respiratory distress.  GI: Soft. Bowel sounds are normal. He exhibits no distension.  Musculoskeletal: He exhibits edema. He exhibits no tenderness.  Neurological: He is alert and oriented to person, place, and time.  Makes good eye contact with examiner.  Follows simple commands.  Fair awareness of deficits Sensation intact light  touch Motor: Bilateral upper extremities 4+/5 proximal to distal Bilateral lower extremities: 4 - proximally, 4+ distally  Skin: Skin is warm and dry.  Incision C/D/I  Psychiatric: He has a normal mood and affect. His behavior is normal.    No results found for this or any previous visit (from the past 24 hour(s)). No results found.  Assessment/Plan: Diagnosis: Traumatic Subdural hematoma status post craniotomy Labs and images independently reviewed.  Records reviewed and summated above.  [place] score:  >/ VII  Speech to evaluate for Post traumatic amnesia and interval GOAT scores to assess progress.  NeuroPsych evaluation for behavorial assessment.  Provide environmental management by reducing the level of stimulation, tolerating restlessness when possible, protecting patient from harming self or others and reducing patient's cognitive confusion.  Address behavioral concerns include providing structured environments and daily routines.  Cognitive therapy to direct modular abilities in order to maintain goals  including problem solving,  self regulation/monitoring, self management, attention, and memory.  Fall precautions; pt at risk for second impact syndrome  Prevention of secondary injury: monitor for hypotension, hypoxia, seizures or signs of increased ICP  Prophylactic AED:   Consider pharmacological intervention if necessary with neurostimulants,  Such as amantadine, methylphenidate, modafinil, etc.  Consider Propranolol for agitation and storming  Avoid medications that could impair cognitive abilities, such as anticholinergics, antihistaminic, benzodiazapines, narcotics, etc when possible  1. Does the need for close, 24 hr/day medical supervision in concert with the patient's rehab needs make it unreasonable for this patient to be served in a less intensive setting? Yes  2. Co-Morbidities requiring supervision/potential complications: end-stage renal disease with hemodialysis continue recs per nephro), cardiac transplant, diabetes mellitus (Monitor in accordance with exercise and adjust meds as necessary), dementia, HTN (trending up, monitor and provide prns in accordance with increased physical exertion and pain), leukocytosis (cont to monitor for signs and symptoms of infection, further workup if indicated), ABLA on chronic anemia (transfuse if necessary to ensure appropriate perfusion for increased activity tolerance) 3. Due to bladder management, safety, skin/wound care, disease management, medication administration, pain management and patient education, does the patient require 24 hr/day rehab nursing? Yes 4. Does the patient require coordinated care of a physician, rehab nurse, PT (1-2 hrs/day, 5 days/week), OT (1-2 hrs/day, 5 days/week) and SLP (1-2 hrs/day, 5 days/week) to address physical and functional deficits in the context of the above medical diagnosis(es)? Yes Addressing deficits in the following areas: balance, endurance, locomotion, strength, transferring, bathing, dressing, toileting, cognition and psychosocial  support 5. Can the patient actively participate in an intensive therapy program of at least 3 hrs of therapy per day at least 5 days per week? Yes 6. The potential for patient to make measurable gains while on inpatient rehab is excellent 7. Anticipated functional outcomes upon discharge from inpatient rehab are modified independent and supervision  with PT, modified independent and supervision with OT, independent and modified independent with SLP. 8. Estimated rehab length of stay to reach the above functional goals is: 14-17 days. 9. Does the patient have adequate social supports and living environment to accommodate these discharge functional goals? Yes 10. Anticipated D/C setting: Home 11. Anticipated post D/C treatments: HH therapy and Home excercise program 12. Overall Rehab/Functional Prognosis: good  RECOMMENDATIONS: This patient's condition is appropriate for continued rehabilitative care in the following setting: CIR once medically stable (BPs trending up) Patient has agreed to participate in recommended program. Yes Note that insurance prior authorization may be required for reimbursement for  recommended care.  Comment: Rehab Admissions Coordinator to follow up.  Delice Lesch, MD 05/08/2016

## 2016-05-08 NOTE — Consult Note (Signed)
TEAM 1 - Stepdown/ICU TEAM CONSULT F/U NOTE  Kenley Morgano [id number] DOB: [birth year] DOA: 05/02/2016 PCP: Leamon Arnt, MD  Admit HPI / Brief Narrative: 75 year old M w/ Hx Dementia, CVA, ESRD on HD T/Th/Sat, Cardiac transplant in 1995 followed at [facility], HTN, HLD ,DM2, and skin cancer who presented to the ED complaining of increased weakness and a fall. Patient stated that he had become progressively more weak and suffered numerous falls at home.  He reported having chronic issues with orthostatic hypotension and gets very dizzy upon standing which she states is typically why he falls down.  Workup in the ED noted a large mixed density subdural hematoma on the right compatible with acute and chronic subdural hematoma w/ a 5 mm midline shift to the left via CT head.  6/15 Patient S/P Right frontal parietal craniotomy with evacuation of subdural hematoma. Placement of subdural drain by Neurosurgery.  HPI/Subjective: The patient is resting comfortably at time of visit.  He has no new complaints.  Denies chest pain shortness of breath or abdominal pain.  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 Trenton Gammon performed right sided craniotomy with evacuation on 6/15 -Continue management per neurosurgery   Heart transplant / mild Cardiomyopathy  -cont Cyclosporine and Mycophenolate per usual doses  -6/16 TTE noted cardiac function improved when compared to echocardiogram from 07/01/2014  Hypertension -SBP goal 145-170; DBP goal<105 - remains above goal - adjust tx and follow   DM2 -6/13 A1c 5.5 - no CBGs recorded - order and follow - serum glucose has been ok   ESRD on HD T/Th/Sat -Nephrology following   Anemia of chronic disease -secondary to chronic kidney disease, stable  Depression -cont Prozac   Dementia  -cont Aricept 10 mg QHS   Code Status: FULL Family Communication: no family present at time of  exam  Antibiotics: none  DVT prophylaxis: SCDs  Objective: Blood pressure 209/68, pulse 67, temperature 97.9 F (36.6 C), temperature source Oral, resp. rate 20, height 6\' 2"  (1.88 m), weight 69.7 kg (153 lb 10.6 oz), SpO2 100 %.  Intake/Output Summary (Last 24 hours) at 05/08/16 1542 Last data filed at 05/08/16 0200  Gross per 24 hour  Intake    120 ml  Output      1 ml  Net    119 ml     Exam: General: No acute respiratory distress Lungs: Clear to auscultation bilaterally without wheezes or crackles Cardiovascular: Regular rate and rhythm without murmur gallop or rub normal S1 and S2 Abdomen: Nontender, nondistended, soft, bowel sounds positive, no rebound, no ascites, no appreciable mass Extremities: No significant cyanosis, clubbing, or edema bilateral lower extremities  Data Reviewed: Basic Metabolic Panel:  Recent Labs Lab 05/02/16 1930  05/03/16 1811 05/04/16 0239  05/04/16 1931 05/05/16 0630 05/06/16 0930 05/06/16 1539 05/07/16 0449  NA  --   < > 136 135  < > 133* 133* 136 137 135  K  --   < > 3.7 4.2  < > 4.3 4.5 3.6 3.5 3.7  CL  --   < > 96* 96*  < > 97* 97* 99* 100* 97*  CO2  --   < > 29 28  < > 23 23 28 30 29   GLUCOSE  --   < > 116* 123*  < > 149* 215* 158* 136* 142*  BUN  --   < > 10 16  < > 22* 29* 21* 9 26*  CREATININE  --   < >  3.30* 4.58*  < > 5.51* 6.10* 3.75* 2.05* 3.43*  CALCIUM  --   < > 8.7* 8.5*  < > 8.3* 8.3* 8.3* 8.1* 8.1*  MG 2.0  --   --  2.0  --   --   --   --   --   --   PHOS  --   --  2.5  --   --  4.6  --  4.5 2.9  --   < > = values in this interval not displayed.  CBC:  Recent Labs Lab 05/03/16 0627 05/04/16 1355 05/04/16 1930 05/05/16 0630 05/06/16 1525  WBC 7.5 8.9 9.2 8.0 12.9*  HGB 10.7* 9.7* 9.5* 9.4* 8.9*  HCT 34.7* 32.3* 31.5* 31.6* 29.5*  MCV 100.3* 99.7 99.1 99.1 100.0  PLT 250 207 221 225 225    Liver Function Tests:  Recent Labs Lab 05/02/16 1930 05/03/16 0627 05/03/16 1811 05/04/16 1931  05/06/16 0930 05/06/16 1539  AST 14* 13*  --   --   --   --   ALT 12* 12*  --   --   --   --   ALKPHOS 111 125  --   --   --   --   BILITOT 0.6 0.6  --   --   --   --   PROT 6.0* 5.9*  --   --   --   --   ALBUMIN 3.3* 3.2* 3.2* 2.9* 2.8* 2.9*   Coags:  Recent Labs Lab 05/04/16 0239  INR 1.19    Cardiac Enzymes:  Recent Labs Lab 05/02/16 1930 05/03/16 0022 05/03/16 0554  TROPONINI <0.03 <0.03 <0.03    CBG:  Recent Labs Lab 05/02/16 1523 05/03/16 1654 05/04/16 0939  GLUCAP 116* 123* 83    Recent Results (from the past 240 hour(s))  MRSA PCR Screening     Status: None   Collection Time: 05/03/16  1:15 AM  Result Value Ref Range Status   MRSA by PCR NEGATIVE NEGATIVE Final    Comment:        The GeneXpert MRSA Assay (FDA approved for NASAL specimens only), is one component of a comprehensive MRSA colonization surveillance program. It is not intended to diagnose MRSA infection nor to guide or monitor treatment for MRSA infections.      Studies:   Recent x-ray studies have been reviewed in detail by the Attending Physician  Scheduled Meds:  Scheduled Meds: . aspirin  81 mg Oral QHS  . cycloSPORINE modified  50 mg Oral BID  . [START ON 05/09/2016] darbepoetin (ARANESP) injection - DIALYSIS  100 mcg Intravenous Q Tue-HD  . dexamethasone  4 mg Intravenous Q8H  . docusate sodium  100 mg Oral q morning - 10a  . donepezil  10 mg Oral QHS  . doxercalciferol  2 mcg Intravenous Q T,Th,Sa-HD  . famotidine  20 mg Oral Daily  . feeding supplement (PRO-STAT SUGAR FREE 64)  30 mL Oral BID  . [START ON 05/09/2016] ferric gluconate (FERRLECIT/NULECIT) IV  62.5 mg Intravenous Q Tue-HD  . FLUoxetine  20 mg Oral Q breakfast  . hydrALAZINE  50 mg Oral Q6H  . levETIRAcetam  500 mg Oral BID  . levothyroxine  75 mcg Oral QAC breakfast  . metoprolol tartrate  25 mg Oral BID  . multivitamin  1 tablet Oral q morning - 10a  . mycophenolate  250 mg Oral QHS  .  mycophenolate  500 mg Oral q morning - 10a  .  pantoprazole  40 mg Oral Daily  . pravastatin  10 mg Oral QHS  . sodium chloride flush  3 mL Intravenous Q12H  . sodium chloride flush  3 mL Intravenous Q12H  . vitamin B-12  1,000 mcg Oral Q breakfast    Time spent on care of this patient: 25 mins   [facility] T , MD   [facility] Office  [phone] Pager - Text Page per Shea Evans as per below:  On-Call/Text Page:      [email or web address]      password [email or web address]  If 7PM-7AM, please contact night-coverage [email or web address] Password [email or web address] 05/08/2016, 3:42 PM   LOS: 6 days

## 2016-05-09 DIAGNOSIS — I6200 Nontraumatic subdural hemorrhage, unspecified: Secondary | ICD-10-CM

## 2016-05-09 LAB — RENAL FUNCTION PANEL
ANION GAP: 14 (ref 5–15)
Albumin: 2.6 g/dL — ABNORMAL LOW (ref 3.5–5.0)
BUN: 73 mg/dL — AB (ref 6–20)
CHLORIDE: 94 mmol/L — AB (ref 101–111)
CO2: 21 mmol/L — ABNORMAL LOW (ref 22–32)
Calcium: 7.7 mg/dL — ABNORMAL LOW (ref 8.9–10.3)
Creatinine, Ser: 6.78 mg/dL — ABNORMAL HIGH (ref 0.61–1.24)
GFR, EST AFRICAN AMERICAN: 8 mL/min — AB (ref >60)
GFR, EST NON AFRICAN AMERICAN: 7 mL/min — AB (ref >60)
Glucose, Bld: 222 mg/dL — ABNORMAL HIGH (ref 65–99)
PHOSPHORUS: 4.8 mg/dL — AB (ref 2.5–4.6)
POTASSIUM: 3.8 mmol/L (ref 3.5–5.1)
Sodium: 129 mmol/L — ABNORMAL LOW (ref 135–145)

## 2016-05-09 LAB — CBC
HEMATOCRIT: 28.2 % — AB (ref 39.0–52.0)
HEMOGLOBIN: 8.9 g/dL — AB (ref 13.0–17.0)
MCH: 30.8 pg (ref 26.0–34.0)
MCHC: 31.6 g/dL (ref 30.0–36.0)
MCV: 97.6 fL (ref 78.0–100.0)
Platelets: 231 10*3/uL (ref 150–400)
RBC: 2.89 MIL/uL — AB (ref 4.22–5.81)
RDW: 15.7 % — ABNORMAL HIGH (ref 11.5–15.5)
WBC: 12.3 10*3/uL — AB (ref 4.0–10.5)

## 2016-05-09 LAB — GLUCOSE, CAPILLARY
GLUCOSE-CAPILLARY: 231 mg/dL — AB (ref 65–99)
Glucose-Capillary: 208 mg/dL — ABNORMAL HIGH (ref 65–99)
Glucose-Capillary: 275 mg/dL — ABNORMAL HIGH (ref 65–99)

## 2016-05-09 LAB — MYCOPHENOLIC ACID (CELLCEPT)
MPA Glucuronide: 46 ug/mL (ref 15–125)
MPA: 1.1 ug/mL (ref 1.0–3.5)

## 2016-05-09 MED ORDER — DARBEPOETIN ALFA 100 MCG/0.5ML IJ SOSY
PREFILLED_SYRINGE | INTRAMUSCULAR | Status: AC
  Administered 2016-05-09: 100 ug via INTRAVENOUS
  Filled 2016-05-09: qty 0.5

## 2016-05-09 MED ORDER — DEXAMETHASONE 4 MG PO TABS
4.0000 mg | ORAL_TABLET | Freq: Three times a day (TID) | ORAL | Status: DC
  Administered 2016-05-10 (×2): 4 mg via ORAL
  Filled 2016-05-09 (×2): qty 1

## 2016-05-09 MED ORDER — DOXERCALCIFEROL 4 MCG/2ML IV SOLN
INTRAVENOUS | Status: AC
  Administered 2016-05-09: 2 ug via INTRAVENOUS
  Filled 2016-05-09: qty 2

## 2016-05-09 NOTE — Progress Notes (Signed)
Rehab admissions - I spoke with patient's wife.  She did not know about inpatient rehab here in the hospital.  She has given me permission to open the case and request acute inpatient rehab admission.  She is open to patient going to [facility] as well.  I will update all when I hear back from insurance carrier.  Wife says she has confirmed that [facility] is primary even though patient has [employer].  I will attempt precert with both insurance carriers.  Call me for questions.  RC:[id number]

## 2016-05-09 NOTE — Procedures (Signed)
I have personally attended this patient's dialysis session.   3K bath (K 3.8) Unfortunately set for fluid goal rather than weight goal and I did not see until late in TMT Will have 3 kg extra vol on board with pre weight 72.2 kg (EDW 69.5) No AVF issues  Jamal Maes, MD [place] Pager 05/09/2016, 11:12 AM

## 2016-05-09 NOTE — Progress Notes (Signed)
Doing very well.  No HA. Weakness resolved but still unsteady on feet.  Wd cdi   Awaiting decision on inpt rehap vs snif

## 2016-05-09 NOTE — Progress Notes (Signed)
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 Patient stated that he had become progressively more weak and suffered numerous falls at home. He reported having chronic issues with orthostatic hypotension and gets very dizzy upon standing which she states is typically why he falls down. Workup in the ED noted a large mixed density subdural hematoma on the right compatible with acute and chronic subdural hematoma w/ a 5 mm midline shift to the left via CT head.  6/15 Patient S/P Right frontal parietal craniotomy with evacuation of subdural hematoma. Placement of subdural drain by Neurosurgery.   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 -Nephrology following   Anemia of chronic disease -secondary to chronic kidney disease, stable  Depression -cont Prozac   Dementia  -cont Aricept 10 mg QHS   Code Status: FULL Family Communication: no family present at time of exam  Antibiotics: none  DVT prophylaxis: SCDs     Objective: BP 179/71 mmHg  Pulse 86  Temp(Src) 98.5 F (36.9 C) (Oral)  Resp 18  Ht 6\' 2"  (1.88 m)  Wt 72.1 kg (158 lb 15.2 oz)  BMI 20.40 kg/m2  SpO2 98%  Intake/Output Summary (Last 24 hours) at 05/09/16 2137 Last data filed at 05/09/16 1203  Gross per 24 hour  Intake      0 ml  Output      0 ml  Net      0 ml   Filed Weights   05/06/16 1130 05/09/16 0745 05/09/16 1203  Weight: 69.7 kg (153 lb 10.6 oz) 72.2 kg (159 lb 2.8 oz) 72.1 kg (158 lb 15.2 oz)    Exam:   General:  NAD  Cardiovascular: RRR  Respiratory: CTABL  Abdomen: Soft/ND/NT, positive BS  Musculoskeletal: No Edema  Neuro: aaox3, hard of hearing  Data Reviewed: Basic Metabolic Panel:  Recent Labs Lab 05/03/16 1811 05/04/16 0239  05/04/16 1931 05/05/16 0630 05/06/16 0930 05/06/16 1539 05/07/16 0449 05/09/16 0800  NA 136 135  < > 133* 133* 136 137 135 129*  K 3.7 4.2  < > 4.3 4.5 3.6 3.5 3.7 3.8  CL  96* 96*  < > 97* 97* 99* 100* 97* 94*  CO2 29 28  < > 23 23 28 30 29  21*  GLUCOSE 116* 123*  < > 149* 215* 158* 136* 142* 222*  BUN 10 16  < > 22* 29* 21* 9 26* 73*  CREATININE 3.30* 4.58*  < > 5.51* 6.10* 3.75* 2.05* 3.43* 6.78*  CALCIUM 8.7* 8.5*  < > 8.3* 8.3* 8.3* 8.1* 8.1* 7.7*  MG  --  2.0  --   --   --   --   --   --   --   PHOS 2.5  --   --  4.6  --  4.5 2.9  --  4.8*  < > = values in this interval not displayed. Liver Function Tests:  Recent Labs Lab 05/03/16 0627 05/03/16 1811 05/04/16 1931 05/06/16 0930 05/06/16 1539 05/09/16 0800  AST 13*  --   --   --   --   --     ALT 12*  --   --   --   --   --   ALKPHOS 125  --   --   --   --   --   BILITOT 0.6  --   --   --   --   --   PROT 5.9*  --   --   --   --   --   ALBUMIN 3.2* 3.2* 2.9* 2.8* 2.9* 2.6*   No results for input(s): LIPASE, AMYLASE in the last 168 hours. No results for input(s): AMMONIA in the last 168 hours. CBC:  Recent Labs Lab 05/04/16 1355 05/04/16 1930 05/05/16 0630 05/06/16 1525 05/09/16 0800  WBC 8.9 9.2 8.0 12.9* 12.3*  HGB 9.7* 9.5* 9.4* 8.9* 8.9*  HCT 32.3* 31.5* 31.6* 29.5* 28.2*  MCV 99.7 99.1 99.1 100.0 97.6  PLT 207 221 225 225 231   Cardiac Enzymes:    Recent Labs Lab 05/03/16 0022 05/03/16 0554  TROPONINI <0.03 <0.03   BNP (last 3 results) No results for input(s): BNP in the last 8760 hours.  ProBNP (last 3 results) No results for input(s): PROBNP in the last 8760 hours.  CBG:  Recent Labs Lab 05/03/16 1654 05/04/16 0939 05/08/16 2232 05/09/16 0625 05/09/16 1639  GLUCAP 123* 83 271* 208* 275*    Recent Results (from the past 240 hour(s))  MRSA PCR Screening     Status: None   Collection Time: 05/03/16  1:15 AM  Result Value Ref Range Status   MRSA by PCR NEGATIVE NEGATIVE Final    Comment:        The GeneXpert MRSA Assay (FDA approved for NASAL specimens only), is one component of a comprehensive MRSA colonization surveillance program. It is not intended to diagnose MRSA infection nor to guide or monitor treatment for MRSA infections.      Studies: No results found.  Scheduled Meds: . aspirin  81 mg Oral QHS  . cycloSPORINE modified  50 mg Oral BID  . darbepoetin (ARANESP) injection - DIALYSIS  100 mcg Intravenous Q Tue-HD  . dexamethasone  4 mg Intravenous Q8H  . docusate sodium  100 mg Oral q morning - 10a  . donepezil  10 mg Oral QHS  . doxercalciferol  2 mcg Intravenous Q T,Th,Sa-HD  . famotidine  20 mg Oral Daily  . feeding supplement (PRO-STAT SUGAR FREE 64)  30 mL Oral BID  . ferric gluconate (FERRLECIT/NULECIT) IV   62.5 mg Intravenous Q Tue-HD  .  FLUoxetine  20 mg Oral Q breakfast  . hydrALAZINE  100 mg Oral Q8H  . insulin aspart  0-5 Units Subcutaneous QHS  . insulin aspart  0-9 Units Subcutaneous TID WC  . labetalol  100 mg Oral BID  . levETIRAcetam  500 mg Oral BID  . levothyroxine  75 mcg Oral QAC breakfast  . multivitamin  1 tablet Oral q morning - 10a  . mycophenolate  250 mg Oral QHS  . mycophenolate  500 mg Oral q morning - 10a  . pantoprazole  40 mg Oral Daily  . pravastatin  10 mg Oral QHS  . sodium chloride flush  3 mL Intravenous Q12H  . vitamin B-12  1,000 mcg Oral Q breakfast    Continuous Infusions:    Time spent: 60mins  Wataru Mccowen MD, PhD  [facility] Pager [phone]. If 7PM-7AM, please contact night-coverage at [email or web address], password [facility] 05/09/2016, 9:37 PM  LOS: 7 days

## 2016-05-09 NOTE — Progress Notes (Signed)
PT Cancellation Note  Patient Details Name: [name] [name] MRN: [id number] DOB: [birth year]   Cancelled Treatment:    Reason Eval/Treat Not Completed: Patient at procedure or test/unavailable Pt at HD. PT to return as able.   Kingsley Callander 05/09/2016, 9:13 AM  Kittie Plater, PT, DPT Pager #: [phone] Office #: [phone]

## 2016-05-09 NOTE — Progress Notes (Signed)
Rehab admissions - I spoke with [facility] insurance case manager who says they are secondary insurance.  Primary insurance is [employer].  I have confirmed this with patient's wife.  I have opened the case with [facility] and have faxed information to them requesting acute inpatient rehab admission.  I will update all once I hear back from insurance case manager.  [id number]

## 2016-05-09 NOTE — Progress Notes (Signed)
[facility] Rounding Note  Subjective/Interval Events:  Seen in dialysis Says he is "doing great" Hoping for inpatient rehab, then AF SNF (per wife)  Objective Vital signs in last 24 hours: Filed Vitals:   05/09/16 0900 05/09/16 0929 05/09/16 0959 05/09/16 1030  BP: 153/94 140/63 156/67 171/72  Pulse: 76 77 75 76  Temp:      TempSrc:      Resp: 15 16 15 17   Height:      Weight:      SpO2:        Physical Exam: BP 144/74 mmHg  Pulse 76  Temp(Src) 97.8 F (36.6 C) (Oral)  Resp 17  Ht 6\' 2"  (1.88 m)  Wt 69.7 kg (153 lb 10.6 oz)  BMI 19.72 kg/m2  SpO2 100%  Alert, oriented X3 HOH (hearing aids) Seen in HD Staple line from craniotomy intact, dry incision Regular S1S2 No S3 Lungs clear Abd soft No edema of LE's Left AVF cannulated for HD General:  Alert , NAD OX3     Recent Labs Lab 05/06/16 0930 05/06/16 1539 05/07/16 0449 05/09/16 0800  NA 136 137 135 129*  K 3.6 3.5 3.7 3.8  CL 99* 100* 97* 94*  CO2 28 30 29  21*  GLUCOSE 158* 136* 142* 222*  BUN 21* 9 26* 73*  CREATININE 3.75* 2.05* 3.43* 6.78*  CALCIUM 8.3* 8.1* 8.1* 7.7*  PHOS 4.5 2.9  --  4.8*     Recent Labs Lab 05/02/16 1930 05/03/16 0627  05/06/16 0930 05/06/16 1539 05/09/16 0800  AST 14* 13*  --   --   --   --   ALT 12* 12*  --   --   --   --   ALKPHOS 111 125  --   --   --   --   BILITOT 0.6 0.6  --   --   --   --   PROT 6.0* 5.9*  --   --   --   --   ALBUMIN 3.3* 3.2*  < > 2.8* 2.9* 2.6*  < > = values in this interval not displayed. No results for input(s): LIPASE, AMYLASE in the last 168 hours. No results for input(s): AMMONIA in the last 168 hours.   Recent Labs Lab 05/04/16 1355 05/04/16 1930 05/05/16 0630 05/06/16 1525 05/09/16 0800  WBC 8.9 9.2 8.0 12.9* 12.3*  HGB 9.7* 9.5* 9.4* 8.9* 8.9*  HCT 32.3* 31.5* 31.6* 29.5* 28.2*  MCV 99.7 99.1 99.1 100.0 97.6  PLT 207 221 225 225 231     Recent Labs Lab 05/02/16 1930 05/03/16 0022 05/03/16 0554  TROPONINI <0.03 <0.03  <0.03     Recent Labs Lab 05/02/16 1523 05/03/16 1654 05/04/16 0939 05/08/16 2232 05/09/16 0625  GLUCAP 116* 123* 83 271* 208*    Medications:   . aspirin  81 mg Oral QHS  . cycloSPORINE modified  50 mg Oral BID  . darbepoetin (ARANESP) injection - DIALYSIS  100 mcg Intravenous Q Tue-HD  . dexamethasone  4 mg Intravenous Q8H  . docusate sodium  100 mg Oral q morning - 10a  . donepezil  10 mg Oral QHS  . doxercalciferol  2 mcg Intravenous Q T,Th,Sa-HD  . famotidine  20 mg Oral Daily  . feeding supplement (PRO-STAT SUGAR FREE 64)  30 mL Oral BID  . ferric gluconate (FERRLECIT/NULECIT) IV  62.5 mg Intravenous Q Tue-HD  . FLUoxetine  20 mg Oral Q breakfast  . hydrALAZINE  100 mg Oral Q8H  .  insulin aspart  0-5 Units Subcutaneous QHS  . insulin aspart  0-9 Units Subcutaneous TID WC  . labetalol  100 mg Oral BID  . levETIRAcetam  500 mg Oral BID  . levothyroxine  75 mcg Oral QAC breakfast  . multivitamin  1 tablet Oral q morning - 10a  . mycophenolate  250 mg Oral QHS  . mycophenolate  500 mg Oral q morning - 10a  . pantoprazole  40 mg Oral Daily  . pravastatin  10 mg Oral QHS  . sodium chloride flush  3 mL Intravenous Q12H  . vitamin B-12  1,000 mcg Oral Q breakfast   OP Dialysis: SW TTS 4h 69.5kg 2/2.25 bath P2 Hep none LUA AVF Hectorol 2 mcg IV q TTS (last PTH 297 03/16/16) Mircera 100 mcg IV Q 2 weeks (Last dose 04/27/16 HGB 11.0) Venofer 50 mg IV (last dose 04/29/16 Fe 83 Tsat 33% 04/13/16)   Problems/Plans:  1. S/p fall/R SDH - R frontoparietal craniotomy 6/15. Rehab plans pending.  2. ESRD - TTS HD. On HD now. K 3.8. 3K bath. No heparin. 3. Hypertension - BP's higher side of normal, on metop/ hydral. Have to treat sitting or standing by not supine BP (profound orthostasis) 4. Volume - orthostatic hypotension at home for years, started after his heart transplant. EDW 69.5. Pre weight 72.2 kg but orders were written for a fluid goal rather than a weight -  so will be over EDW after TMT today.... 5. Anemia - HGB 9.4>8.9 - Mircera 100 given 6/8 - prior outpt hgb was 11-  ESA ordered  Aranesp 100 to start today  and IV Fe q tues HD 6. Metabolic bone disease - P 4.6> 2.9  off binders -on VDRA  7. Nutrition -Albumin 2.9 change to renal diet, renal vit/prostat 8. S/P cardiac transplant Immunosuppressant therapy: per primary cont cyclosporine, mycophenolate    Jamal Maes, MD [facility] [phone] Pager 05/09/2016, 11:10 AM

## 2016-05-09 NOTE — Clinical Social Work Note (Signed)
Clinical Social Worker extended bed offers to patient's wife, [name]. Wife preferred [facility] however facility is not in contact with [facility]. Wife reported that primary insurance is [facility] as she has confirmed information with [employer] this morning. Facility reported that [facility] is primary in [place] system. Wife is planning to contact facility to discuss insurance plans.   Currently unsure which insurance is primary.   CSW remains available as needed.   [name], MSW, LCSWA [phone] 05/09/2016 11:28 AM

## 2016-05-09 NOTE — Progress Notes (Signed)
Inpatient Diabetes Program Recommendations  AACE/ADA: New Consensus Statement on Inpatient Glycemic Control (2015)  Target Ranges:  Prepandial:   less than 140 mg/dL      Peak postprandial:   less than 180 mg/dL (1-2 hours)      Critically ill patients:  140 - 180 mg/dL   Lab Results  Component Value Date   GLUCAP 208* 05/09/2016   HGBA1C 5.5 05/02/2016    Review of Glycemic Control  Inpatient Diabetes Program Recommendations:  Correction (SSI): Increase Novolog to moderate correction scale  Thank you  Raoul Pitch MSN, RN,CDE Inpatient Diabetes Coordinator [phone] (team pager)

## 2016-05-09 NOTE — NC FL2 (Signed)
[place] LEVEL OF CARE SCREENING TOOL     IDENTIFICATION  Patient Name: [name] [name] Birthdate: [birth year] Sex: male Admission Date (Current Location): 05/02/2016  [facility] and [place] Number:  Herbalist and Address:  [facility] [place], [place] [address], [place], [place] [address]      Provider Number: [id number]  Attending Physician Name and Address:  Earnie Larsson, MD  Relative Name and Phone Number:       Current Level of Care: Hospital Recommended Level of Care: [place] Prior Approval Number:    Date Approved/Denied:   PASRR Number:   [id number]  Discharge Plan: SNF    Current Diagnoses: Patient Active Problem List   Diagnosis Date Noted  . Acute blood loss anemia   . Cardiomyopathy ([place])   . Controlled diabetes mellitus type 2 with complications ([place])   . 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 Traumatic subdural hematoma ([place]) 05/04/2016  . 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 Subdural hematoma ([place]) 05/02/2016  . CKD (chronic kidney disease)   . Altered mental status   . Protein-calorie malnutrition, severe ([place]) 04/23/2015  . Acute encephalopathy 04/23/2015  . Leukocytosis 04/23/2015  . Anemia of chronic disease 04/23/2015  . Depression 04/23/2015  . UTI (lower urinary tract infection) 04/22/2015  . Barrett's esophagus 03/22/2015  . ESRD on dialysis ([place]) 10/12/2014  . DM (diabetes mellitus), type 2 with renal complications ([place]) [id number]  . Hypothyroidism 10/06/2014  . Hypertension 10/05/2014  . Heart transplanted ([place])   . Hypercholesterolemia 06/20/2014  . 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[place]) 06/20/2014    Orientation RESPIRATION BLADDER Height & Weight     Self, Time, Situation, Place  Normal Continent Weight: 153 lb 10.6 oz (69.7 kg) Height:  6\' 2"  (188 cm)  BEHAVIORAL SYMPTOMS/MOOD NEUROLOGICAL BOWEL NUTRITION STATUS   (NONE  )  (NONE ) Continent Diet (Renal w/ fluid restriction 1246ml)  AMBULATORY STATUS COMMUNICATION OF NEEDS Skin   Extensive Assist Verbally Surgical wounds (Head )                       Personal Care Assistance Level of Assistance  Bathing, Dressing, Feeding Bathing Assistance: Limited assistance Feeding assistance: Limited assistance Dressing Assistance: Limited assistance     Functional Limitations Info  Sight, Hearing, Speech Sight Info: Adequate Hearing Info: Adequate Speech Info: Adequate    SPECIAL CARE FACTORS FREQUENCY  PT (By licensed PT), OT (By licensed OT)     PT Frequency: 4 OT Frequency: 2            Contractures      Additional Factors Info  Code Status, Allergies Code Status Info: FULL CODE  Allergies Info: Amlodipine, Norvasc, Ace Inhibitors, Angiotensin Receptor Blockers           Current Medications (05/09/2016):  This is the current hospital active medication list Current Facility-Administered Medications  Medication Dose Route Frequency Provider Last Rate Last Dose  . acetaminophen (TYLENOL) tablet 650 mg  650 mg Oral Q6H PRN Reyne Dumas, MD   650 mg at 05/03/16 1950   Or  . acetaminophen (TYLENOL) suppository 650 mg  650 mg Rectal Q6H PRN Reyne Dumas, MD      . aspirin chewable tablet 81 mg  81 mg Oral QHS Earnie Larsson, MD   81 mg at 05/08/16 2213  . cycloSPORINE modified (NEORAL) capsule 50 mg  50 mg Oral BID Belenda Cruise  P Schorr, NP   50 mg at 05/08/16 2216  . Darbepoetin Alfa (ARANESP) injection 100 mcg  100 mcg Intravenous Q Tue-HD Alric Seton, PA-C      . dexamethasone (DECADRON) injection 4 mg  4 mg Intravenous Q8H Earnie Larsson, MD   4 mg at 05/08/16 2219  . docusate sodium (COLACE) capsule 100 mg  100 mg Oral q morning - 10a Ernest Haber, PA-C   100 mg at 05/08/16 1317  . donepezil (ARICEPT) tablet 10 mg  10 mg Oral QHS Rhetta Mura Schorr, NP   10 mg at 05/08/16 2215  . doxercalciferol (HECTOROL) injection 2 mcg  2 mcg Intravenous Q  T,Th,Sa-HD Valentina Gu, NP   2 mcg at 05/06/16 1036  . famotidine (PEPCID) tablet 20 mg  20 mg Oral Daily Earnie Larsson, MD   20 mg at 05/08/16 1027  . feeding supplement (PRO-STAT SUGAR FREE 64) liquid 30 mL  30 mL Oral BID Valentina Gu, NP   30 mL at 05/08/16 1027  . ferric gluconate (NULECIT) 62.5 mg in sodium chloride 0.9 % 100 mL IVPB  62.5 mg Intravenous Q Tue-HD Alric Seton, PA-C      . FLUoxetine (PROZAC) capsule 20 mg  20 mg Oral Q breakfast Earnie Larsson, MD   20 mg at 05/08/16 [id number]  . fluticasone (FLONASE) 50 MCG/ACT nasal spray 2 spray  2 spray Each Nare QHS PRN Reyne Dumas, MD      . hydrALAZINE (APRESOLINE) injection 10-40 mg  10-40 mg Intravenous Q4H PRN Juanito Doom, MD   20 mg at 05/07/16 1449  . hydrALAZINE (APRESOLINE) tablet 100 mg  100 mg Oral Q8H Cherene Altes, MD   100 mg at 05/08/16 2214  . HYDROcodone-acetaminophen (NORCO/VICODIN) 5-325 MG per tablet 1 tablet  1 tablet Oral Q4H PRN Earnie Larsson, MD   1 tablet at 05/05/16 0757  . HYDROmorphone (DILAUDID) injection 0.5-1 mg  0.5-1 mg Intravenous Q2H PRN Earnie Larsson, MD      . insulin aspart (novoLOG) injection 0-5 Units  0-5 Units Subcutaneous QHS Cherene Altes, MD   3 Units at 05/08/16 2244  . insulin aspart (novoLOG) injection 0-9 Units  0-9 Units Subcutaneous TID WC Cherene Altes, MD      . labetalol (NORMODYNE) tablet 100 mg  100 mg Oral BID Cherene Altes, MD   100 mg at 05/08/16 2215  . labetalol (NORMODYNE,TRANDATE) injection 10-40 mg  10-40 mg Intravenous Q10 min PRN Allie Bossier, MD   20 mg at 05/05/16 1327  . levalbuterol (XOPENEX) nebulizer solution 0.63 mg  0.63 mg Nebulization Q6H PRN Reyne Dumas, MD      . levETIRAcetam (KEPPRA) tablet 500 mg  500 mg Oral BID Earnie Larsson, MD   500 mg at 05/08/16 2215  . levothyroxine (SYNTHROID, LEVOTHROID) tablet 75 mcg  75 mcg Oral QAC breakfast Rhetta Mura Schorr, NP   75 mcg at 05/08/16 [id number]  . multivitamin (RENA-VIT) tablet 1 tablet  1 tablet  Oral q morning - 10a Jeryl Columbia, NP   1 tablet at 05/08/16 2214  . mycophenolate (CELLCEPT) capsule 250 mg  250 mg Oral QHS Rhetta Mura Schorr, NP   250 mg at 05/08/16 2215  . mycophenolate (CELLCEPT) capsule 500 mg  500 mg Oral q morning - 10a Rhetta Mura Schorr, NP   500 mg at 05/08/16 1027  . naloxone (NARCAN) injection 0.08 mg  0.08 mg Intravenous PRN Earnie Larsson, MD      .  ondansetron (ZOFRAN) tablet 4 mg  4 mg Oral Q6H PRN Reyne Dumas, MD       Or  . ondansetron (ZOFRAN) injection 4 mg  4 mg Intravenous Q6H PRN Reyne Dumas, MD   4 mg at 05/03/16 1459  . pantoprazole (PROTONIX) EC tablet 40 mg  40 mg Oral Daily Reyne Dumas, MD   40 mg at 05/08/16 1028  . pravastatin (PRAVACHOL) tablet 10 mg  10 mg Oral QHS Rhetta Mura Schorr, NP   10 mg at 05/08/16 2214  . promethazine (PHENERGAN) tablet 12.5-25 mg  12.5-25 mg Oral Q4H PRN Earnie Larsson, MD      . senna-docusate (Senokot-S) tablet 1 tablet  1 tablet Oral QHS PRN Reyne Dumas, MD      . sodium chloride flush (NS) 0.9 % injection 3 mL  3 mL Intravenous Q12H Reyne Dumas, MD   3 mL at 05/08/16 2239  . sorbitol 70 % solution 30 mL  30 mL Oral TID PRN Ernest Haber, PA-C      . vitamin B-12 (CYANOCOBALAMIN) tablet 1,000 mcg  1,000 mcg Oral Q breakfast Earnie Larsson, MD   1,000 mcg at 05/08/16 [id number]     Discharge Medications: Please see discharge summary for a list of discharge medications.  Relevant Imaging Results:  Relevant Lab Results:   Additional Information SSN [id number]  [name], MSW, LCSWA [phone] 05/09/2016 8:37 AM

## 2016-05-10 ENCOUNTER — Ambulatory Visit: Payer: Medicare HMO | Admitting: Physical Therapy

## 2016-05-10 LAB — GLUCOSE, CAPILLARY
Glucose-Capillary: 245 mg/dL — ABNORMAL HIGH (ref 65–99)
Glucose-Capillary: 276 mg/dL — ABNORMAL HIGH (ref 65–99)

## 2016-05-10 MED ORDER — SODIUM CHLORIDE 0.9% FLUSH: 10.0000 mL | INTRAVENOUS | Status: DC | PRN

## 2016-05-10 NOTE — Progress Notes (Signed)
Rehab admissions - Currently inpatient rehab beds are full with limited bed availability the rest of this week.  I have not heard back from [facility] case manager.  I have spoken with patient's wife to let her know about lack of bed availability inside [facility] at this time.  Call me for questions.  [phone]

## 2016-05-10 NOTE — Progress Notes (Signed)
[facility] Progress Note    Subjective:   "I feel wonderful! Everything is going well". Up in chair awaiting PT.  Denies pain at present, says he has not had any orthostatic issues that he knows about.  Very pleasant.  Staples intact R temporal area.   Objective Filed Vitals:   05/09/16 1752 05/09/16 2149 05/10/16 0202 05/10/16 0549  BP: 179/71 159/59 173/61 182/71  Pulse: 86 81 78 78  Temp: 98.5 F (36.9 C) 98.9 F (37.2 C) 97.7 F (36.5 C) 98 F (36.7 C)  TempSrc: Oral Oral Oral Oral  Resp: 18 18 18 18   Height:      Weight:      SpO2: 98% 99% 100% 100%   Physical Exam General: Pleasant, NAD Heart: S1, S2, RRR  Lungs:Bilateral breath sounds CTA A/P sl dec in bases Abdomen: active BS, Non-tender Extremities:No LE edema Dialysis Access: LUA AVF + bruit  Additional Objective Labs: Basic Metabolic Panel:  Recent Labs Lab 05/06/16 0930 05/06/16 1539 05/07/16 0449 05/09/16 0800  NA 136 137 135 129*  K 3.6 3.5 3.7 3.8  CL 99* 100* 97* 94*  CO2 28 30 29  21*  GLUCOSE 158* 136* 142* 222*  BUN 21* 9 26* 73*  CREATININE 3.75* 2.05* 3.43* 6.78*  CALCIUM 8.3* 8.1* 8.1* 7.7*  PHOS 4.5 2.9  --  4.8*     Recent Labs Lab 05/06/16 0930 05/06/16 1539 05/09/16 0800  ALBUMIN 2.8* 2.9* 2.6*     Recent Labs Lab 05/04/16 1355 05/04/16 1930 05/05/16 0630 05/06/16 1525 05/09/16 0800  WBC 8.9 9.2 8.0 12.9* 12.3*  HGB 9.7* 9.5* 9.4* 8.9* 8.9*  HCT 32.3* 31.5* 31.6* 29.5* 28.2*  MCV 99.7 99.1 99.1 100.0 97.6  PLT 207 221 225 225 231    CBG:  Recent Labs Lab 05/08/16 2232 05/09/16 0625 05/09/16 1639 05/09/16 2159 05/10/16 0654  GLUCAP 271* 208* 275* 231* 245*   Medications:   . aspirin  81 mg Oral QHS  . cycloSPORINE modified  50 mg Oral BID  . darbepoetin (ARANESP) injection - DIALYSIS  100 mcg Intravenous Q Tue-HD  . dexamethasone  4 mg Oral Q8H  . docusate sodium  100 mg Oral q morning - 10a  . donepezil  10 mg Oral QHS  .  doxercalciferol  2 mcg Intravenous Q T,Th,Sa-HD  . famotidine  20 mg Oral Daily  . feeding supplement (PRO-STAT SUGAR FREE 64)  30 mL Oral BID  . ferric gluconate (FERRLECIT/NULECIT) IV  62.5 mg Intravenous Q Tue-HD  . FLUoxetine  20 mg Oral Q breakfast  . hydrALAZINE  100 mg Oral Q8H  . insulin aspart  0-5 Units Subcutaneous QHS  . insulin aspart  0-9 Units Subcutaneous TID WC  . labetalol  100 mg Oral BID  . levETIRAcetam  500 mg Oral BID  . levothyroxine  75 mcg Oral QAC breakfast  . multivitamin  1 tablet Oral q morning - 10a  . mycophenolate  250 mg Oral QHS  . mycophenolate  500 mg Oral q morning - 10a  . pantoprazole  40 mg Oral Daily  . pravastatin  10 mg Oral QHS  . sodium chloride flush  3 mL Intravenous Q12H  . vitamin B-12  1,000 mcg Oral Q breakfast      Dialysis Orders: [facility] TTS 4 hours  400/auto 1.5  EDW 69.5 kg anticipate higher EDW at dscharge 2.0K/2.25 Ca UF profile 2 No Heparin Hectorol 2 mcg IV q TTS (last PTH 297 03/16/16)  Mircera 100 mcg IV Q 2 weeks (Last dose 04/27/16 HGB 11.0) Venofer 50 mg IV (last dose 04/29/16 Fe 83 Tsat 33% 04/13/16)  Problems/Plans:  1. S/p fall/R SDH - R frontoparietal craniotomy 6/15. Rehab plans pending. Too high fall risk for DC home.  2. ESRD - TTS HD. HD tomorrow on schedule. No heparin. K+3.8 Use 3.0 K bath.  3. Hypertension - BP's higher side of normal, on metop/ hydral. Have to treat sitting or standing by not supine BP (profound orthostasis) HD 05/09/16 Pre wt 72.2 kg Net UF 500 Post wt 72.1 KG. Run even again tomorrow.  4. Volume - orthostatic hypotension at home for years, started after his heart transplant.  5. Anemia - HGB 9.4>8.9 - Mircera 100 given 6/8 - prior outpt hgb was 11- Received Aranesp 100 IV 05/09/16 and IV Fe q tues HD. Follow HGB 6. Metabolic bone disease - P 4.6> 2.9 off binders -on VDRA  7. Nutrition -Albumin 2.6 change to renal diet, renal vit/prostat 8. S/P cardiac transplant  Immunosuppressant therapy: per primary cont cyclosporine, mycophenolate 9. DM: per primary.    Rita H. Brown NP-C 05/10/2016, 10:08 AM  [employer] [phone]  I have seen and examined this patient and agree with plan and assessment in the above note. Having less profound orthostasis (with no edema or SOB) at higher EDW - anticipate higher EDW at discharge. Since [facility] SNF not an option with pt's insurance, wife no longer interested in SNF and would prefer inpt Rehab. [facility] Medicare decision pending.   Mathilda Maguire B,MD 05/10/2016 10:57 AM

## 2016-05-10 NOTE — Clinical Social Work Placement (Signed)
   CLINICAL SOCIAL WORK PLACEMENT  NOTE  Date:  05/10/2016  Patient Details  Name: [name] [name] MRN: [id number] Date of Birth: 08-Jan-1941  Clinical Social Work is seeking post-discharge placement for this patient at the [place] level of care (*CSW will initial, date and re-position this form in  chart as items are completed):  Yes   Patient/family provided with [place] Work Department's list of facilities offering this level of care within the geographic area requested by the patient (or if unable, by the patient's family).  Yes   Patient/family informed of their freedom to choose among providers that offer the needed level of care, that participate in Medicare, Medicaid or managed care program needed by the patient, have an available bed and are willing to accept the patient.  Yes   Patient/family informed of [facility]'s ownership interest in [facility] and [facility], as well as of the fact that they are under no obligation to receive care at these facilities.  PASRR submitted to EDS on 05/09/16     PASRR number received on 05/09/16     Existing PASRR number confirmed on       FL2 transmitted to all facilities in geographic area requested by pt/family on 05/09/16     FL2 transmitted to all facilities within larger geographic area on       Patient informed that his/her managed care company has contracts with or will negotiate with certain facilities, including the following:        Yes   Patient/family informed of bed offers received.  Patient chooses bed at  [facility] and [facility] )     Physician recommends and patient chooses bed at      Patient to be transferred to  ([facility] ) on 05/10/16.  Patient to be transferred to facility by  ([facility] [place] with wife )     Patient family notified on 05/10/16 of transfer.  Name of family member notified:   ([name], [name] at bedside)      PHYSICIAN Please sign FL2, Please prepare priority discharge summary, including medications     Additional Comment:    _______________________________________________ Rozell Searing, LCSW 05/10/2016, 1:59 PM

## 2016-05-10 NOTE — Progress Notes (Signed)
Physical Therapy Treatment Patient Details Name: [name] [name] MRN: [id number] DOB: [birth year] Today's Date: 05/10/2016    History of Present Illness 75 y.o. male 75 year old WM PMHx Dementia, CVA, ESRD on HD T/Th/Sat at [facility], Cardiac transplant in 1995, followed at [facility], HTN, MI (prior to heart transplant), HLD ,DM Type 2 with complication, skin cancer.  Who presents with increased weakness and a fall with resultant SDH. Patient S/P Right frontal parietal craniotomy with evacuation of subdural hematoma. Placement of subdural drain by Neurosurgery.Niel Hummer removed 6/16.    PT Comments    Pt progressing well. Pt remains to have generalized weakness, impaired balance and is at increased falls risk. Acute PT to follow.  Follow Up Recommendations  CIR;Supervision/Assistance - 24 hour     Equipment Recommendations  None recommended by PT    Recommendations for Other Services Rehab consult     Precautions / Restrictions Precautions Precautions: Fall Restrictions Weight Bearing Restrictions: No    Mobility  Bed Mobility               General bed mobility comments: pt up in chair  Transfers Overall transfer level: Needs assistance Equipment used: Rolling walker (2 wheeled) Transfers: Sit to/from Stand Sit to Stand: Min guard         General transfer comment: v/c's for safe hand placment, mildly impulsive to get up  Ambulation/Gait Ambulation/Gait assistance: Min guard;Min assist Ambulation Distance (Feet): 150 Feet Assistive device: Rolling walker (2 wheeled);None Gait Pattern/deviations: Step-through pattern;Decreased stride length;Antalgic Gait velocity: slow Gait velocity interpretation: Below normal speed for age/gender General Gait Details: worked on ambulation without rollator and holding onto hallway way rail. pt wiht bilat knee instability and decreased step length and stability. pt given rollator pt with  improved fluidity of gait pattern in stability   Stairs            Wheelchair Mobility    Modified Rankin (Stroke Patients Only) Modified Rankin (Stroke Patients Only) Pre-Morbid Rankin Score: No symptoms Modified Rankin: Moderately severe disability     Balance Overall balance assessment: History of Falls                                  Cognition Arousal/Alertness: Awake/alert Behavior During Therapy: WFL for tasks assessed/performed Overall Cognitive Status: History of cognitive impairments - at baseline                      Exercises Other Exercises Other Exercises: did side stepping to the L and the R x 15 feet with hands on handrail Other Exercises: completed 2 sets of 10 squats with support of rail    General Comments        Pertinent Vitals/Pain Pain Assessment: No/denies pain    Home Living                      Prior Function            PT Goals (current goals can now be found in the care plan section) Acute Rehab PT Goals Patient Stated Goal: didn't state Progress towards PT goals: Progressing toward goals    Frequency  Min 4X/week    PT Plan Current plan remains appropriate    Co-evaluation             End of Session Equipment Utilized During Treatment: Gait belt Activity Tolerance: Patient tolerated  treatment well;Patient limited by fatigue Patient left: in chair;with call bell/phone within reach;with chair alarm set     Time: 1455-1515 PT Time Calculation (min) (ACUTE ONLY): 20 min  Charges:  $Gait Training: 8-22 mins                    G Codes:      Kingsley Callander 05/10/2016, 3:45 PM   Kittie Plater, PT, DPT Pager #: [phone] Office #: [phone]

## 2016-05-10 NOTE — Progress Notes (Signed)
Occupational Therapy Treatment Patient Details Name: [name] [name] MRN: [id number] DOB: 10-08-1941 Today's Date: 05/10/2016    History of present illness 75 y.o. male 75 year old WM PMHx Dementia, CVA, ESRD on HD T/Th/Sat at [facility], Cardiac transplant in 1995, followed at [facility], HTN, MI (prior to heart transplant), HLD ,DM Type 2 with complication, skin cancer.  Who presents with increased weakness and a fall with resultant SDH. Patient S/P Right frontal parietal craniotomy with evacuation of subdural hematoma. Placement of subdural drain by Neurosurgery.Niel Hummer removed 6/16.   OT comments  Continue to recommend CIR. Feel pt will continue to benefit from acute OT to increase independence prior to d/c.   Follow Up Recommendations  CIR;Supervision/Assistance - 24 hour    Equipment Recommendations  Other (comment) (TBD)    Recommendations for Other Services      Precautions / Restrictions Precautions Precautions: Fall Restrictions Weight Bearing Restrictions: No       Mobility Bed Mobility               General bed mobility comments: not assessed  Transfers Overall transfer level: Needs assistance   Transfers: Sit to/from Stand Sit to Stand: Min guard         General transfer comment: RW in front of pt. Talked with pt about trying to control descent when going to sit.    Balance          Supervision-Min guard standing at sink during functional tasks.                          ADL Overall ADL's : Needs assistance/impaired     Grooming: Wash/dry face;Applying deodorant;Standing;Sitting;Min guard   Upper Body Bathing: Min guard;Sitting;Standing   Lower Body Bathing: Min guard;Sit to/from stand   Upper Body Dressing : Minimal assistance   Lower Body Dressing: Sitting/lateral leans;Modified independent (donning/doffing socks)   Toilet Transfer: Min guard;Ambulation;RW (sit to stand from chair)            Functional mobility during ADLs: Min guard;Rolling walker        Vision                     Perception     Praxis      Cognition  Awake/Alert Behavior During Therapy: WFL for tasks assessed/performed Overall Cognitive Status: History of cognitive impairments - at baseline (dementia)                       Extremity/Trunk Assessment               Exercises     Shoulder Instructions       General Comments      Pertinent Vitals/ Pain       Pain Assessment: No/denies pain  Home Living                                          Prior Functioning/Environment              Frequency Min 2X/week     Progress Toward Goals  OT Goals(current goals can now be found in the care plan section)  Progress towards OT goals: Progressing toward goals  Acute Rehab OT Goals Patient Stated Goal: go home and see his baby and cat OT Goal Formulation: With  patient Time For Goal Achievement: 05/19/16 Potential to Achieve Goals: Fair ADL Goals Pt Will Perform Grooming: with supervision;standing Pt Will Perform Upper Body Bathing: with supervision;sitting Pt Will Perform Lower Body Bathing: with supervision;sit to/from stand Pt Will Transfer to Toilet: with supervision;ambulating;bedside commode Pt Will Perform Toileting - Clothing Manipulation and hygiene: with supervision;sit to/from stand  Plan Discharge plan remains appropriate    Co-evaluation                 End of Session Equipment Utilized During Treatment: Gait belt;Rolling walker   Activity Tolerance Patient tolerated treatment well   Patient Left in chair;with call bell/phone within reach;with chair alarm set   Nurse Communication Other (comment) (stool on dressing; heart monitor off)        Time: [id number] OT Time Calculation (min): 17 min  Charges: OT General Charges $OT Visit: 1 Procedure OT Treatments $Self Care/Home Management : 8-22  mins  Benito Mccreedy OTR/L [id number] 05/10/2016, 10:37 AM

## 2016-05-10 NOTE — Care Management Note (Signed)
Case Management Note  Patient Details  Name: [name] [name] MRN: [id number] Date of Birth: [birth year]  Subjective/Objective:                    Action/Plan: Anticipate discharge to [facility] SNF today. No further CM needs but will be available should additional discharge needs arise.   Expected Discharge Date:  05/07/16               Expected Discharge Plan:  [place]  In-House Referral:  NA  Discharge planning Services  CM Consult  Post Acute Care Choice:  Home Health Choice offered to:  Spouse  DME Arranged:  N/A DME Agency:  NA  HH Arranged:  RN, PT, Nurse's Aide [place] Agency:  [place]  Status of Service:  Completed, signed off  If discussed at [place] of Stay Meetings, dates discussed:    Additional Comments:  Delrae Sawyers, RN 05/10/2016, 1:52 PM

## 2016-05-10 NOTE — Clinical Social Work Note (Signed)
Clinical Social Worker met with patient and wife at bedside and both are agreeable to placement at [facility] Rehab. Patient and wife reported that they are pleased with the care received by medical staff and appreciated social work intervention.  Patient is medically stable for d/c today, 6/21. CSW contacted MD office and left message with secretary in regards to patient being d/c'ed today. Secretary to pass message along to MD.   CSW remains available as needed.   [name], MSW, LCSWA [phone] 05/10/2016 1:40 PM

## 2016-05-10 NOTE — Clinical Social Work Note (Signed)
Patient accepted on 5 day Letter of Guarantee pending insurance authorization.   Clinical Social Worker facilitated patient discharge including contacting patient family and facility to confirm patient discharge plans.  Clinical information faxed to facility and family agreeable with plan. Patient's wife, [name] to transport patient to facility by car.  RN to call report prior to discharge [phone].  Clinical Social Worker will sign off for now as social work intervention is no longer needed. Please consult [place] again if new need arises.  [name], MSW, [place] [phone] 05/10/2016 2:07 PM

## 2016-05-10 NOTE — Clinical Social Work Note (Signed)
Clinical Social Worker contacted patient's wife and discussed insurance coverage. CSW has reviewed chart and noted that patient's primary insurance is [employer]. Wife is aware. CSW related to pt's wife that [facility] is NOT contracted with [employer] and would not be abel to extend be offer.   CSW presented other bed offers and pt's wife reported that she is not interested in patient going to a different facility. At this time pt's wife prefers IP REHAB and awaits a decision from [facility]. Wife is no longer interested in SNF at this time however is agreeable to CSW following case in the event that she changes her mind.  Wife hopeful for IP REHAB admission. CSW will continue to follow case.   [name], MSW, LCSWA [phone] 05/10/2016 10:26 AM

## 2016-05-10 NOTE — Progress Notes (Signed)
D/C orders received, pt for D/C to SNF today.  IV and telemetry D/C.  Rx and D/C instructions given with verbalized understanding.  Family at bedside to assist with D/C.  Staff brought pt downstairs via wheelchair.

## 2016-05-10 NOTE — Clinical Social Work Note (Signed)
Clinical Social Work Assessment  Patient Details  Name: [name] [name] MRN: [id number] Date of Birth: 12-Mar-1941  Date of referral:  05/08/16               Reason for consult:  Facility Placement, Discharge Planning                Permission sought to share information with:  Family Supports, Customer service manager, Case Optician, dispensing granted to share information::  Yes, Verbal Permission Granted  Name::      [name])  Agency::   (SNF's )  Relationship::   (Spouse )  Contact Information:   [phone])  Housing/Transportation Living arrangements for the past 2 months:  [place] of Information:  Patient, Spouse Patient Interpreter Needed:  None Criminal Activity/Legal Involvement Pertinent to Current Situation/Hospitalization:  No - Comment as needed Significant Relationships:  Spouse Lives with:  Spouse Do you feel safe going back to the place where you live?  No Need for family participation in patient care:  Yes (Comment)  Care giving concerns:  PT recommending CIR/IP REHAB. SNF as alternative d/c plan.   Social Worker assessment / plan:  CSW met with patient and wife at bedside in reference to post-acute placement for SNF. CSW introduced CSW role and SNF process. SNF reviewed and provided SNF list. Patient's wife stated that they are familiar with [facility] and [place] and reported that patient was a [place] resident there several years ago. No further concerns reported at this time. Patient agreeable to plan in the even IP REHAB is not available. CSW will continue to follow patient and pt's family for continued support and to facilitate pt's d/c needs once medically stable.   Employment status:  Retired Nurse, adult, [place] PT Recommendations:  Inpatient [place] / Referral to community resources:  [place]  Patient/Family's Response to care:  Pt a/o x4. Pt and  spouse agreeable to SNF backup. Wife supportive and strongly involved in pt's care. Pt and spouse pleasant, polite and appreciated social work intervention.  Patient/Family's Understanding of and Emotional Response to Diagnosis, Current Treatment, and Prognosis:  Pt and wife knowledgeable of surgical interventions.   Emotional Assessment Appearance:  Appears stated age Attitude/Demeanor/Rapport:   (Pleasant ) Affect (typically observed):  Accepting, Appropriate, Calm, Happy, Pleasant Orientation:  Oriented to Situation, Oriented to  Time, Oriented to Place, Oriented to Self Alcohol / Substance use:  Never Used Psych involvement (Current and /or in the community):  No (Comment)  Discharge Needs  Concerns to be addressed:  Care Coordination Readmission within the last 30 days:  No Current discharge risk:  Dependent with Mobility Barriers to Discharge:  Insurance Authorization, Barriers Resolved   [name], MSW, LCSWA [phone]

## 2016-05-10 NOTE — Discharge Summary (Signed)
Physician Discharge Summary  Patient ID: [name] [name] MRN: [id number] DOB/AGE: [birth year] 76 y.o.  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  Patient was medically stabilized and underwent right-sided craniotomy with evacuation of hemorrhage with good results.  Postoperatively his headache and left-sided weakness are much improved.  He is medically stable continuing to receive chronic hemodialysis.  He is participating in therapy.  He remains a high fall risk secondary to unsteadiness.  Plan is for discharge to a skilled nursing facility for further therapy and convalescence.  Consults:   Significant Diagnostic Studies:   Treatments:   Discharge Exam: Blood pressure 182/71, pulse 78, temperature 98 F (36.7 C), temperature source Oral, resp. rate 18, height 6\' 2"  (1.88 m), weight 72.1 kg (158 lb 15.2 oz), SpO2 100 %. Awake and alert.  Oriented and appropriate.  Cranial nerve function intact.  Motor and sensory function intact.  Wound clean and dry.  Disposition: 01-Home or Self Care  Discharge Instructions    Face-to-face encounter (required for Medicare/Medicaid patients)    Complete by:  As directed   I KOHUT, STEPHEN certify that this patient is under my  care and that I, or a nurse practitioner or physician's assistant working with me, had a face-to-face encounter that meets the physician face-to-face encounter requirements with this patient on 05/02/2016. The encounter with the patient was in whole, or in part for the following medical condition(s) which is the primary reason for home health care (List medical condition):  The encounter with the patient was in whole, or in part, for the following medical condition, which is the primary reason for home health care:  gait instablity  I certify that, based on my findings, the following services are medically necessary home health services:  Physical therapy  Reason for Medically [place]:  Therapy- Personnel officer, Public librarian  My clinical findings support the need for the above services:  Unsafe ambulation due to balance issues  Further, I certify that my clinical findings support that this patient is homebound due to:  Unsafe ambulation due to balance issues     Home Health    Complete by:  As directed   To provide the following care/treatments:   [place] [facility]              Medication List    TAKE these medications        acetaminophen 325 MG tablet  Commonly known as:  TYLENOL  Take 2 tablets (650 mg total) by mouth every 6 (six) hours as needed for mild pain (or Fever >/= 101).     aspirin 81 MG chewable tablet  Chew 81 mg by mouth at bedtime.     cholecalciferol 1000 units tablet  Commonly known as:  VITAMIN D  Take 1,000 Units by mouth daily.     cycloSPORINE modified 25 MG capsule  Commonly known as:  NEORAL  Take 50 mg by mouth 2 (two) times daily.     diphenoxylate-atropine 2.5-0.025 MG tablet  Commonly known as:  LOMOTIL  Take 1 tablet by mouth 2 (two) times daily as needed for diarrhea or loose stools.     donepezil 10 MG tablet  Commonly known as:  ARICEPT  Take 10 mg by mouth at bedtime.     FLUoxetine 20 MG  capsule  Commonly known as:  PROZAC  Take 20 mg by mouth daily with breakfast.     fluticasone 50 MCG/ACT nasal spray  Commonly known as:  FLONASE  Place 2 sprays into both nostrils at bedtime as needed for allergies.     hydrALAZINE 50 MG tablet  Commonly known as:  APRESOLINE  Take 50 mg by mouth 3 (three) times daily. Take with 25 mg tablet to = 75 mg three (3) times a day     hydrALAZINE 25 MG tablet  Commonly known as:  APRESOLINE  Take 25 mg by mouth 3 (three) times daily. Take with 50 mg tablet to = 75 mg three (3) times a day     lansoprazole 30 MG capsule  Commonly known as:  PREVACID  Take 30 mg by mouth 4 (four) times a week. Pt takes on Sunday, Monday, Wednesday, and Friday. Does "NOT" take on dialysis days     levothyroxine 75 MCG tablet  Commonly known as:  SYNTHROID, LEVOTHROID  Take 75 mcg by mouth daily before breakfast.     metoprolol tartrate 25 MG tablet  Commonly known as:  LOPRESSOR  Take 2 tablets (50 mg total) by mouth 2 (two) times daily.     multivitamin Tabs tablet  Take 1 tablet by mouth every morning.     mycophenolate 250 MG capsule  Commonly known as:  CELLCEPT  Take 250-500 mg by mouth 2 (two) times daily. 500 mg in the morning and 250 every evening     pravastatin 10 MG tablet  Commonly known as:  PRAVACHOL  Take 10 mg by mouth at bedtime.     PRO-BIOTIC BLEND Caps  Take 1 capsule by mouth daily.     vitamin B-12 1000 MCG tablet  Commonly known as:  CYANOCOBALAMIN  Take 1,000 mcg by mouth daily with breakfast.           Follow-up Information    Follow up with Kelbie Moro A, MD. Schedule an appointment as soon as possible for a visit in 1 week.   Specialty:  Neurosurgery   Contact information:   [address] [place] [address] [phone]       Signed: Charlie Pitter 05/10/2016, 1:40 PM

## 2016-05-10 NOTE — Clinical Social Work Note (Signed)
Clinical Social Worker placed call to patient's wife, [name] [name] to confirm that [facility] does not have any available beds at this time. Patient's wife now agreeable to SNF and would like for CSW to meet with her and patient at bedside once she arrives to further discuss plan for SNF. Wife considering [facility] at [facility] and [facility].  CSW remains available as needed.   [name], MSW, [place] [phone] 05/10/2016 12:07 PM

## 2016-05-10 NOTE — Progress Notes (Signed)
No new issues.  Awaiting placement.  Remains too high of a fall risk for home DC

## 2016-06-14 ENCOUNTER — Encounter: Payer: Self-pay | Admitting: Podiatry

## 2016-06-14 ENCOUNTER — Ambulatory Visit (INDEPENDENT_AMBULATORY_CARE_PROVIDER_SITE_OTHER): Payer: Medicare HMO | Admitting: Podiatry

## 2016-06-14 DIAGNOSIS — M79676 Pain in unspecified toe(s): Secondary | ICD-10-CM | POA: Diagnosis not present

## 2016-06-14 DIAGNOSIS — B351 Tinea unguium: Secondary | ICD-10-CM

## 2016-06-14 NOTE — Patient Instructions (Signed)
Diabetes and Foot Care Diabetes may cause you to have problems because of poor blood supply (circulation) to your feet and legs. This may cause the skin on your feet to become thinner, break easier, and heal more slowly. Your skin may become dry, and the skin may peel and crack. You may also have nerve damage in your legs and feet causing decreased feeling in them. You may not notice minor injuries to your feet that could lead to infections or more serious problems. Taking care of your feet is one of the most important things you can do for yourself.  HOME CARE INSTRUCTIONS  Wear shoes at all times, even in the house. Do not go barefoot. Bare feet are easily injured.  Check your feet daily for blisters, cuts, and redness. If you cannot see the bottom of your feet, use a mirror or ask someone for help.  Wash your feet with warm water (do not use hot water) and mild soap. Then pat your feet and the areas between your toes until they are completely dry. Do not soak your feet as this can dry your skin.  Apply a moisturizing lotion or petroleum jelly (that does not contain alcohol and is unscented) to the skin on your feet and to dry, brittle toenails. Do not apply lotion between your toes.  Trim your toenails straight across. Do not dig under them or around the cuticle. File the edges of your nails with an emery board or nail file.  Do not cut corns or calluses or try to remove them with medicine.  Wear clean socks or stockings every day. Make sure they are not too tight. Do not wear knee-high stockings since they may decrease blood flow to your legs.  Wear shoes that fit properly and have enough cushioning. To break in new shoes, wear them for just a few hours a day. This prevents you from injuring your feet. Always look in your shoes before you put them on to be sure there are no objects inside.  Do not cross your legs. This may decrease the blood flow to your feet.  If you find a minor scrape,  cut, or break in the skin on your feet, keep it and the skin around it clean and dry. These areas may be cleansed with mild soap and water. Do not cleanse the area with peroxide, alcohol, or iodine.  When you remove an adhesive bandage, be sure not to damage the skin around it.  If you have a wound, look at it several times a day to make sure it is healing.  Do not use heating pads or hot water bottles. They may burn your skin. If you have lost feeling in your feet or legs, you may not know it is happening until it is too late.  Make sure your health care provider performs a complete foot exam at least annually or more often if you have foot problems. Report any cuts, sores, or bruises to your health care provider immediately. SEEK MEDICAL CARE IF:   You have an injury that is not healing.  You have cuts or breaks in the skin.  You have an ingrown nail.  You notice redness on your legs or feet.  You feel burning or tingling in your legs or feet.  You have pain or cramps in your legs and feet.  Your legs or feet are numb.  Your feet always feel cold. SEEK IMMEDIATE MEDICAL CARE IF:   There is increasing redness,   swelling, or pain in or around a wound.  There is a red line that goes up your leg.  Pus is coming from a wound.  You develop a fever or as directed by your health care provider.  You notice a bad smell coming from an ulcer or wound.   This information is not intended to replace advice given to you by your health care provider. Make sure you discuss any questions you have with your health care provider.   Document Released: 11/03/2000 Document Revised: 07/09/2013 Document Reviewed: 04/15/2013 Elsevier Interactive Patient Education 2016 Elsevier Inc.  

## 2016-06-15 NOTE — Progress Notes (Signed)
Patient ID: [name] [name], male   DOB: [birth year], 75 y.o.   MRN: [id number]  Subjective: This patient presents complaining of painful toenails when walking wearing shoes and as well as at nighttime. He denies any infection around the nails, however describes a rather persistent discomfort which is temporary relief when the nails are debrided  Objective: Orientated 3 No peripheral edema bilaterally DP and PT pulses 0/4 bilaterally  Sensation to 10 g monofilament wire intact 5/5 bilaterally Vibratory sensation reactive bilaterally Ankle reflex equal reactive bilaterally  Dermatological: Dry atrophic skin bilaterally The toenails are brittle, discolored, incurvated 6-10 without any obvious erythema, edema, drainage  No deformities noted  Assessment: Decrease pedal pulses suggestive peripheral arterial disease 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

## 2016-06-23 ENCOUNTER — Other Ambulatory Visit: Payer: Self-pay | Admitting: Neurosurgery

## 2016-06-23 DIAGNOSIS — S065XAA Traumatic subdural hemorrhage with loss of consciousness status unknown, initial encounter: Secondary | ICD-10-CM

## 2016-06-23 DIAGNOSIS — S065X9A Traumatic subdural hemorrhage with loss of consciousness of unspecified duration, initial encounter: Secondary | ICD-10-CM

## 2016-06-27 ENCOUNTER — Emergency Department (HOSPITAL_COMMUNITY)
Admission: EM | Admit: 2016-06-27 | Discharge: 2016-06-27 | Disposition: A | Discharge status: Home / Self Care | Payer: Medicare HMO | Attending: Emergency Medicine | Admitting: Emergency Medicine

## 2016-06-27 ENCOUNTER — Encounter (HOSPITAL_COMMUNITY): Payer: Self-pay

## 2016-06-27 ENCOUNTER — Emergency Department (HOSPITAL_COMMUNITY): Payer: Medicare HMO

## 2016-06-27 DIAGNOSIS — E1122 Type 2 diabetes mellitus with diabetic chronic kidney disease: Secondary | ICD-10-CM | POA: Insufficient documentation

## 2016-06-27 DIAGNOSIS — S82002A Unspecified fracture of left patella, initial encounter for closed fracture: Secondary | ICD-10-CM | POA: Diagnosis not present

## 2016-06-27 DIAGNOSIS — S8992XA Unspecified injury of left lower leg, initial encounter: Secondary | ICD-10-CM | POA: Diagnosis present

## 2016-06-27 DIAGNOSIS — Y999 Unspecified external cause status: Secondary | ICD-10-CM | POA: Insufficient documentation

## 2016-06-27 DIAGNOSIS — W1830XA Fall on same level, unspecified, initial encounter: Secondary | ICD-10-CM | POA: Insufficient documentation

## 2016-06-27 DIAGNOSIS — W19XXXA Unspecified fall, initial encounter: Secondary | ICD-10-CM

## 2016-06-27 DIAGNOSIS — Z992 Dependence on renal dialysis: Secondary | ICD-10-CM | POA: Diagnosis not present

## 2016-06-27 DIAGNOSIS — E039 Hypothyroidism, unspecified: Secondary | ICD-10-CM | POA: Insufficient documentation

## 2016-06-27 DIAGNOSIS — Z8673 Personal history of transient ischemic attack (TIA), and cerebral infarction without residual deficits: Secondary | ICD-10-CM | POA: Insufficient documentation

## 2016-06-27 DIAGNOSIS — Y92009 Unspecified place in unspecified non-institutional (private) residence as the place of occurrence of the external cause: Secondary | ICD-10-CM | POA: Insufficient documentation

## 2016-06-27 DIAGNOSIS — Z951 Presence of aortocoronary bypass graft: Secondary | ICD-10-CM | POA: Insufficient documentation

## 2016-06-27 DIAGNOSIS — N186 End stage renal disease: Secondary | ICD-10-CM

## 2016-06-27 DIAGNOSIS — I2510 Atherosclerotic heart disease of native coronary artery without angina pectoris: Secondary | ICD-10-CM | POA: Diagnosis not present

## 2016-06-27 DIAGNOSIS — Z85828 Personal history of other malignant neoplasm of skin: Secondary | ICD-10-CM | POA: Insufficient documentation

## 2016-06-27 DIAGNOSIS — Y9301 Activity, walking, marching and hiking: Secondary | ICD-10-CM | POA: Diagnosis not present

## 2016-06-27 DIAGNOSIS — I252 Old myocardial infarction: Secondary | ICD-10-CM | POA: Diagnosis not present

## 2016-06-27 DIAGNOSIS — I120 Hypertensive chronic kidney disease with stage 5 chronic kidney disease or end stage renal disease: Secondary | ICD-10-CM | POA: Insufficient documentation

## 2016-06-27 DIAGNOSIS — S0990XA Unspecified injury of head, initial encounter: Secondary | ICD-10-CM | POA: Insufficient documentation

## 2016-06-27 DIAGNOSIS — Z7982 Long term (current) use of aspirin: Secondary | ICD-10-CM | POA: Diagnosis not present

## 2016-06-27 LAB — TROPONIN I

## 2016-06-27 MED ORDER — OXYCODONE-ACETAMINOPHEN 5-325 MG PO TABS: 1.0000 | ORAL_TABLET | ORAL | 0 refills | Status: DC | PRN

## 2016-06-27 NOTE — ED Provider Notes (Signed)
[facility] DEPT Provider Note   CSN: [id number] Arrival date & time: 06/27/16  [id number]  First Provider Contact:  First MD Initiated Contact with Patient 06/27/16 1753        History   Chief Complaint Chief Complaint  Patient presents with  . Fall    HPI [name] [name] is a 75 y.o. male.  Status post accidental mechanical fall just prior to ED visit;  now complains of left knee pain, neck pain. No loss of consciousness or neurological deficits. Patient has end-stage renal disease and just returned from dialysis. Severity of pain is mild to moderate. No other injuries      Past Medical History:  Diagnosis Date  . Anemia   . Cancer ([place])    SKIN  . CKD III-IV    a. since transplant in 2001 with significant worsening since 06/2013.  Marland Kitchen Coronary artery disease   . Dementia    mild  . Diabetes mellitus without complication (HCC)    FASTING CBG 90S  . Headache(784.0)   . 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 1995 CABG x 4 in [place], [place];  b. 2001 Developed CHF;  c. 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 10/2000 Cardiac transplant @ [facility], [place];  e. Historically nl Bx and stress testing.  Last stress test ~ 5 yrs ago.  Marland Kitchen Hernia of abdominal wall   . History of blood transfusion   . History of stroke   . Hypercalcemia    a. 06/2013 Hospitalized in [place].  Marland Kitchen Hyperlipidemia   . Hypertension    a. Long h/o HTN, came off of all meds following transplant/wt loss-->meds resumed 06/2013, difficult to control since.  . Myocardial infarction ([place])    PRIOR TO HEART TRANSPLANT  . Orthostatic dizziness   . Orthostatic hypotension    a. Has tried support hose and compression stockings - prefers not to wear.  . Shortness of breath    EXERTION  . Stroke ([place]) 06/2013   NO RESIDUAL - WAS CONFUSED AT TIME - THAT HAS RESOLVED    Patient Active Problem List   Diagnosis Date Noted  . Acute blood loss anemia   . Cardiomyopathy ([place])   . Controlled diabetes mellitus type 2 with  complications ([place])   . 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 Traumatic subdural hematoma ([place]) 05/04/2016  . 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 Subdural hematoma ([place]) 05/02/2016  . CKD (chronic kidney disease)   . Altered mental status   . Protein-calorie malnutrition, severe ([place]) 04/23/2015  . Acute encephalopathy 04/23/2015  . Leukocytosis 04/23/2015  . Anemia of chronic disease 04/23/2015  . Depression 04/23/2015  . UTI (lower urinary tract infection) 04/22/2015  . Barrett's esophagus 03/22/2015  . ESRD on dialysis ([place]) 10/12/2014  . DM (diabetes mellitus), type 2 with renal complications ([place]) [id number]  . Hypothyroidism 10/06/2014  . Hypertension 10/05/2014  . 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 Hypercholesterolemia 06/20/2014  . 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 BASCILIC VEIN TRANSPOSITION Left 07/06/2014   Procedure: [place];  Surgeon: Rosetta Posner, MD;  Location: [facility];  Service: Vascular;  Laterality: Left;  . CARDIAC SURGERY    . CATARACT EXTRACTION    . CORONARY ARTERY BYPASS GRAFT    . CRANIOTOMY Right 05/04/2016   Procedure: Right Craniotomy for Evacuation of Subdural Hematoma;  Surgeon: Earnie Larsson, MD;  Location:  [facility];  Service: Neurosurgery;  Laterality: Right;  . EYE SURGERY Bilateral    CATARACTS  . HEART TRANSPLANT    . SKIN GRAFT FULL THICKNESS LEG     cat scratch that did not heal  . VASECTOMY         Home Medications    Prior to Admission medications   Medication Sig Start Date End Date Taking? Authorizing Provider  acetaminophen (TYLENOL) 325 MG tablet Take 2 tablets (650 mg total) by mouth every 6 (six) hours as needed for mild pain (or Fever >/= 101). 10/14/14   Delfina Redwood, MD  aspirin 81 MG chewable tablet Chew 81 mg by mouth at bedtime.    Historical Provider, MD  cholecalciferol  (VITAMIN D) 1000 UNITS tablet Take 1,000 Units by mouth daily.    Historical Provider, MD  cycloSPORINE modified (NEORAL) 25 MG capsule Take 50 mg by mouth 2 (two) times daily.  08/18/14   Lelon Perla, MD  diphenoxylate-atropine (LOMOTIL) 2.5-0.025 MG per tablet Take 1 tablet by mouth 2 (two) times daily as needed for diarrhea or loose stools.  03/01/15   Historical Provider, MD  donepezil (ARICEPT) 10 MG tablet Take 10 mg by mouth at bedtime.  03/01/15   Historical Provider, MD  FLUoxetine (PROZAC) 20 MG capsule Take 20 mg by mouth daily with breakfast.    Historical Provider, MD  fluticasone (FLONASE) 50 MCG/ACT nasal spray Place 2 sprays into both nostrils at bedtime as needed for allergies.  04/12/16   Historical Provider, MD  hydrALAZINE (APRESOLINE) 25 MG tablet Take 25 mg by mouth 3 (three) times daily. Take with 50 mg tablet to = 75 mg three (3) times a day 04/27/16   Historical Provider, MD  hydrALAZINE (APRESOLINE) 50 MG tablet Take 50 mg by mouth 3 (three) times daily. Take with 25 mg tablet to = 75 mg three (3) times a day 04/18/16   Historical Provider, MD  lansoprazole (PREVACID) 30 MG capsule Take 30 mg by mouth 4 (four) times a week. Pt takes on Sunday, Monday, Wednesday, and Friday. Does "NOT" take on dialysis days 12/22/15   Historical Provider, MD  levothyroxine (SYNTHROID, LEVOTHROID) 75 MCG tablet Take 75 mcg by mouth daily before breakfast.    Historical Provider, MD  metoprolol tartrate (LOPRESSOR) 25 MG tablet Take 2 tablets (50 mg total) by mouth 2 (two) times daily. Patient taking differently: Take 25 mg by mouth 2 (two) times daily.  12/27/14   Ripudeep Krystal Eaton, MD  multivitamin (RENA-VIT) TABS tablet Take 1 tablet by mouth every morning.  03/08/15   Historical Provider, MD  mycophenolate (CELLCEPT) 250 MG capsule Take 250-500 mg by mouth 2 (two) times daily. 500 mg in the morning and 250 every evening    Historical Provider, MD  oxyCODONE-acetaminophen (PERCOCET) 5-325 MG tablet  Take 1 tablet by mouth every 4 (four) hours as needed. 06/27/16   Nat Christen, MD  pravastatin (PRAVACHOL) 10 MG tablet Take 10 mg by mouth at bedtime.     Historical Provider, MD  Probiotic Product (PRO-BIOTIC BLEND) CAPS Take 1 capsule by mouth daily.    Historical Provider, MD  vitamin B-12 (CYANOCOBALAMIN) 1000 MCG tablet Take 1,000 mcg by mouth daily with breakfast.    Historical Provider, MD    Family History Family History  Problem Relation Age of Onset  . Heart attack Father     died @ 78  . Diabetes Father   . Heart attack Mother   .  Diabetes Mother   . Kidney disease Maternal Aunt     Social History Social History  Substance Use Topics  . Smoking status: Never Smoker  . Smokeless tobacco: Never Used  . Alcohol use No     Allergies   Amlodipine; Norvasc [amlodipine besylate]; Ace inhibitors; and Angiotensin receptor blockers   Review of Systems Review of Systems  All other systems reviewed and are negative.    Physical Exam Updated Vital Signs BP 168/68   Pulse 69   Temp 97.8 F (36.6 C) (Oral)   Resp 18   SpO2 98%   Physical Exam  Constitutional: He appears well-developed and well-nourished.  HENT:  Head: Normocephalic and atraumatic.  Eyes: Conjunctivae are normal.  Neck: Neck supple.  Slight posterior cervical tenderness  Cardiovascular: Normal rate and regular rhythm.   No murmur heard. Pulmonary/Chest: Effort normal and breath sounds normal. No respiratory distress.  Abdominal: Soft. There is no tenderness.  Musculoskeletal: He exhibits no edema.  Tender over left patella. Pain with range of motion.  Neurological: He is alert.  Skin: Skin is warm and dry.  Psychiatric: He has a normal mood and affect.  Nursing note and vitals reviewed.    ED Treatments / Results  Labs (all labs ordered are listed, but only abnormal results are displayed) Labs Reviewed  TROPONIN I    EKG  EKG Interpretation  Date/Time:  Tuesday June 27 2016  17:49:26 EDT Ventricular Rate:  68 PR Interval:    QRS Duration: 114 QT Interval:  483 QTC Calculation: 514 R Axis:   85 Text Interpretation:  Sinus rhythm Short PR interval Incomplete right bundle branch block Prolonged QT interval Baseline wander in lead(s) III aVF Confirmed by Lacinda Axon  MD, Ingeborg Fite ([id number]) on 06/27/2016 7:14:57 PM       Radiology Ct Head Wo Contrast  Result Date: 06/27/2016 CLINICAL DATA:  Pt fell outside his house coming from dialysis and hit his head. C/O pain to neck. Family reports h/o hitting head. H/O HTN; Stroke EXAM: CT HEAD WITHOUT CONTRAST CT CERVICAL SPINE WITHOUT CONTRAST TECHNIQUE: Multidetector CT imaging of the head and cervical spine was performed following the standard protocol without intravenous contrast. Multiplanar CT image reconstructions of the cervical spine were also generated. COMPARISON:  05/05/2016 FINDINGS: CT HEAD FINDINGS The ventricles are normal in configuration. There is ventricular and sulcal enlargement consistent with mild atrophy, stable from the prior study. There are no parenchymal masses. There is no midline shift. There is no evidence of a cortical infarct. Patchy areas of white matter hypoattenuation noted consistent with mild to moderate chronic microvascular ischemic change. The right-sided subdural hemorrhage noted on the prior study has decreased in size. A small epidural component underneath the right craniotomy flap measures 5 mm thickness. The subdural component of the fluid measures thickest over the right parietal lobe, 9.4 mm. There is no evidence of acute or new intracranial hemorrhage. Right sided craniotomy flap is stable. Visualized sinuses and mastoid air cells are essentially clear. CT CERVICAL SPINE FINDINGS No fracture.  No spondylolisthesis.  No bone lesion. Mild to moderate loss of disc height at C4-C5 with moderate loss disc height at C5-C6 and C6-C7. Mild facet degenerative changes evident greatest bilaterally at C3-C4.  Spondylotic disc bulging and uncovertebral spurring is most evident at C5-C6 and C6-C7. There is no mild-to-moderate left and mild right neural foraminal narrowing at these levels. Bones are diffusely demineralized. Soft tissues show carotid vascular calcifications but are otherwise unremarkable. Lung apices are clear.  IMPRESSION: HEAD CT: No acute intracranial abnormalities. Decreased size of the residual right subdural hemorrhage since the prior CT. No new intracranial hemorrhage. CERVICAL CT:  No fracture or acute finding. Electronically Signed   By: Lajean Manes M.D.   On: 06/27/2016 18:57   Ct Cervical Spine Wo Contrast  Result Date: 06/27/2016 CLINICAL DATA:  Pt fell outside his house coming from dialysis and hit his head. C/O pain to neck. Family reports h/o hitting head. H/O HTN; Stroke EXAM: CT HEAD WITHOUT CONTRAST CT CERVICAL SPINE WITHOUT CONTRAST TECHNIQUE: Multidetector CT imaging of the head and cervical spine was performed following the standard protocol without intravenous contrast. Multiplanar CT image reconstructions of the cervical spine were also generated. COMPARISON:  05/05/2016 FINDINGS: CT HEAD FINDINGS The ventricles are normal in configuration. There is ventricular and sulcal enlargement consistent with mild atrophy, stable from the prior study. There are no parenchymal masses. There is no midline shift. There is no evidence of a cortical infarct. Patchy areas of white matter hypoattenuation noted consistent with mild to moderate chronic microvascular ischemic change. The right-sided subdural hemorrhage noted on the prior study has decreased in size. A small epidural component underneath the right craniotomy flap measures 5 mm thickness. The subdural component of the fluid measures thickest over the right parietal lobe, 9.4 mm. There is no evidence of acute or new intracranial hemorrhage. Right sided craniotomy flap is stable. Visualized sinuses and mastoid air cells are essentially  clear. CT CERVICAL SPINE FINDINGS No fracture.  No spondylolisthesis.  No bone lesion. Mild to moderate loss of disc height at C4-C5 with moderate loss disc height at C5-C6 and C6-C7. Mild facet degenerative changes evident greatest bilaterally at C3-C4. Spondylotic disc bulging and uncovertebral spurring is most evident at C5-C6 and C6-C7. There is no mild-to-moderate left and mild right neural foraminal narrowing at these levels. Bones are diffusely demineralized. Soft tissues show carotid vascular calcifications but are otherwise unremarkable. Lung apices are clear. IMPRESSION: HEAD CT: No acute intracranial abnormalities. Decreased size of the residual right subdural hemorrhage since the prior CT. No new intracranial hemorrhage. CERVICAL CT:  No fracture or acute finding. Electronically Signed   By: Lajean Manes M.D.   On: 06/27/2016 18:57   Dg Knee Complete 4 Views Left  Result Date: 06/27/2016 CLINICAL DATA:  Patient fell out of past year situs collar. Left knee pain. EXAM: LEFT KNEE - COMPLETE 4+ VIEW COMPARISON:  None. FINDINGS: Bones are demineralized. Linear lucency through the patella suggests incomplete or nondisplaced patellar fracture. Menisci are mineralized bilaterally. There is extensive vascular calcification with surgical clips noted in the medial soft tissues of the leg. Probable small joint effusion. IMPRESSION: Nondisplaced patellar fracture. Electronically Signed   By: Misty Stanley M.D.   On: 06/27/2016 19:06    Procedures Procedures (including critical care time)  Medications Ordered in ED Medications - No data to display   Initial Impression / Assessment and Plan / ED Course  I have reviewed the triage vital signs and the nursing notes.  Pertinent labs & imaging results that were available during my care of the patient were reviewed by me and considered in my medical decision making (see chart for details).  Clinical Course    Status post accidental mechanical fall  with nondisplaced fracture of left patella. CT head and cervical spine showed no acute injuries. I consulted case management for assistance at home. Knee mobilizer, wheelchair, Percocet, referral to orthopedics.  Final Clinical Impressions(s) / ED Diagnoses  Final diagnoses:  Fall, initial encounter  Left patella fracture, closed, initial encounter  ESRD (end stage renal disease) ([place])    New Prescriptions New Prescriptions   OXYCODONE-ACETAMINOPHEN (PERCOCET) 5-325 MG TABLET    Take 1 tablet by mouth every 4 (four) hours as needed.     Nat Christen, MD 06/27/16 2136

## 2016-06-27 NOTE — ED Triage Notes (Signed)
Pt. BIB [facility] for a gorund level fall walking into his house after dialysis today. Didt hit his head and no LOC. C/O of pain in neck and L knee

## 2016-06-27 NOTE — Care Management Note (Addendum)
Case Management Note  Patient Details  Name: [name] [name] MRN: [id number] Date of Birth: 01/05/1941  Subjective/Objective:                  S/P fall with Nondisplaced patellar fracture.  Action/Plan: Cm spoke with patient and family at the bedside regarding discharge plan. Cm spoke about [facility] and they were agreeable to [facility] PT/OT/RN/and aide. They also state that the patient has been active with [facility] in the past and would like to continue with them. Cm will fax orders. Patient needs a wheelchair with leg support due to leg injury and to help with transport to dialysis. Patient needs to take [facility] to dialysis but said that they were having trouble because they could not get him down the hallway and 3 steps but now feel that with the wheelchair they will be able to make this happen. Daughter works from home and will be able to assist in getting him into [facility] and other family member available in the afternoon to help get him back inside. Spouse also at home and is a retired Marine scientist. Patient denies any other DME needs and has a walker, cane, transfer chair, shower chair, BSC. CM will fax order to [facility] for DME wheelchair and family said that they can pick it up if needed. No further CM needs communicated at this time and patient states that he is ready to go home. RN notified.   Expected Discharge Date:  06/27/16               Expected Discharge Plan:  [place]  In-House Referral:     Discharge planning Services  CM Consult  Post Acute Care Choice:  Durable Medical Equipment, Home Health Choice offered to:  Patient, Spouse  DME Arranged:  Wheelchair manual DME Agency:  [place]:  RN, PT, OT, Nurse's Aide [place] Agency:  [place]  Status of Service:  Completed, signed off  If discussed at [place] of Stay Meetings, dates discussed:    Additional Comments:  Guido Sander, RN 06/27/2016, 9:38 PM

## 2016-06-27 NOTE — ED Notes (Signed)
Pt. In CT. 

## 2016-06-27 NOTE — ED Notes (Signed)
Pt and family verbalized understanding of discharge instructions and follow-up care. Pt's spouse demonstrated appropriate use of knee immobilizer application.

## 2016-06-27 NOTE — Discharge Instructions (Signed)
You have fractured your patella or "knee cap". Knee immobilizer, ice, elevate, use wheelchair.  Pain medication. Follow-up with orthopedics. Phone number given.

## 2016-06-30 ENCOUNTER — Other Ambulatory Visit: Payer: 59

## 2016-06-30 NOTE — Care Management (Signed)
Addendum: Patient suffered  a fractured patella which impairs their ability to perform daily activities like ambulation in the home. A {walker will not resolve  issue with performing activities of daily living. A wheelchair will allow patient to safely perform daily activities. Patient can safely propel the wheelchair in the home or has a caregiver who can provide assistance.  Accessories: elevating leg rests (ELRs), wheel locks, extensions and anti-tippers.

## 2016-07-24 IMAGING — CT CT HIP*R* W/O CM
2 of 4 series · 7 of 46 positions shown, 8 images · non-contrast
Comparison: Femur radiograph earlier this day. Pelvis CT 10/05/2014

CLINICAL DATA: Increasing right hip pain after fall yesterday.
Unable to bear weight on right leg.

EXAM:
CT OF THE RIGHT HIP WITHOUT CONTRAST
TECHNIQUE: Multidetector CT imaging of the right hip was performed according to
the standard protocol. Multiplanar CT image reconstructions were
also generated.

[Series 202: soft tissue · axial · 0.31mm/px · z∈[+19,+181]mm · 4 of 111 slices shown, 5 images]
[im 15/111  soft-tissue]
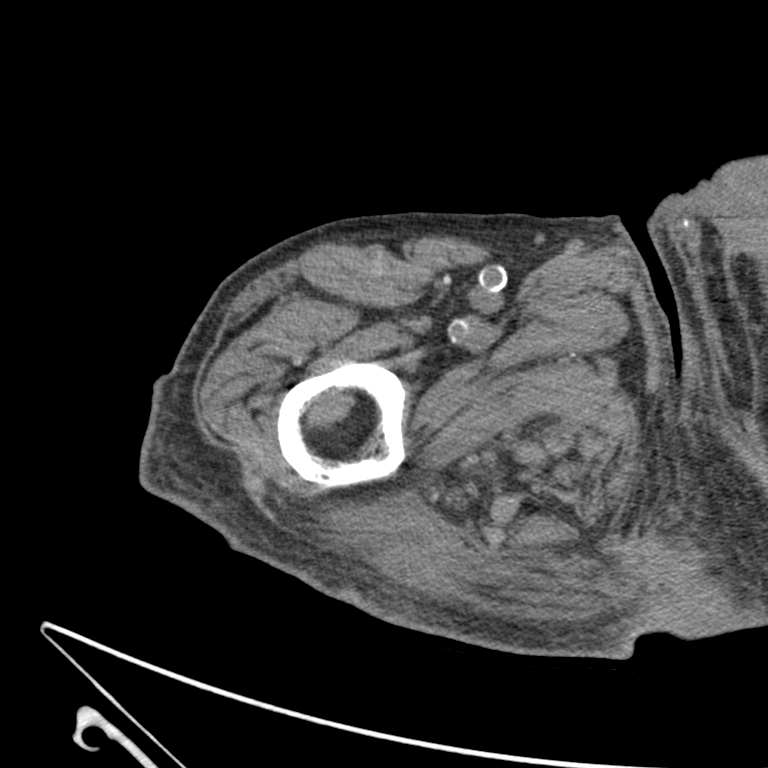
[im 15/111  bone]
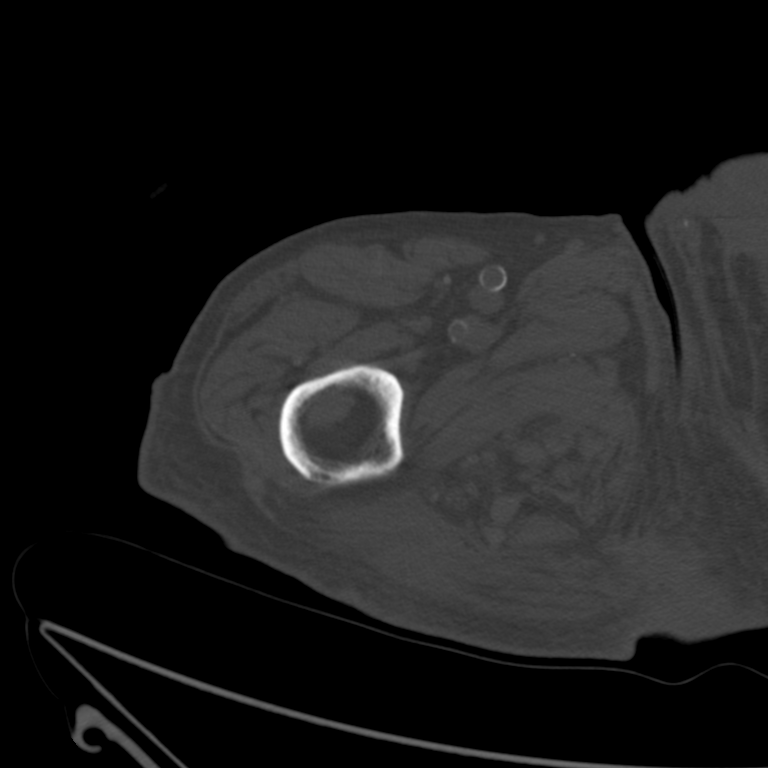
[im 43/111  soft-tissue]
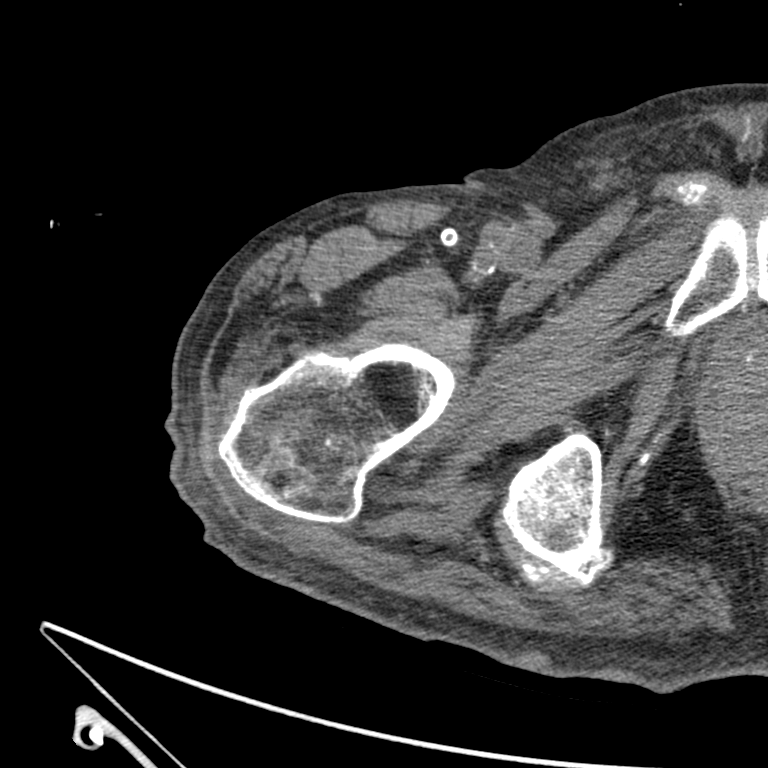
[im 68/111  soft-tissue]
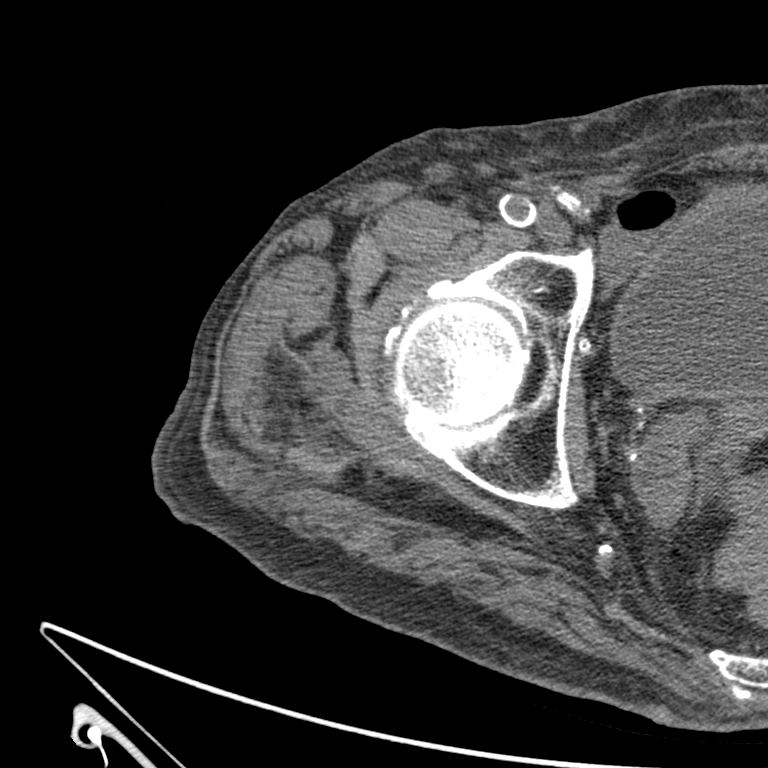
[im 96/111  soft-tissue]
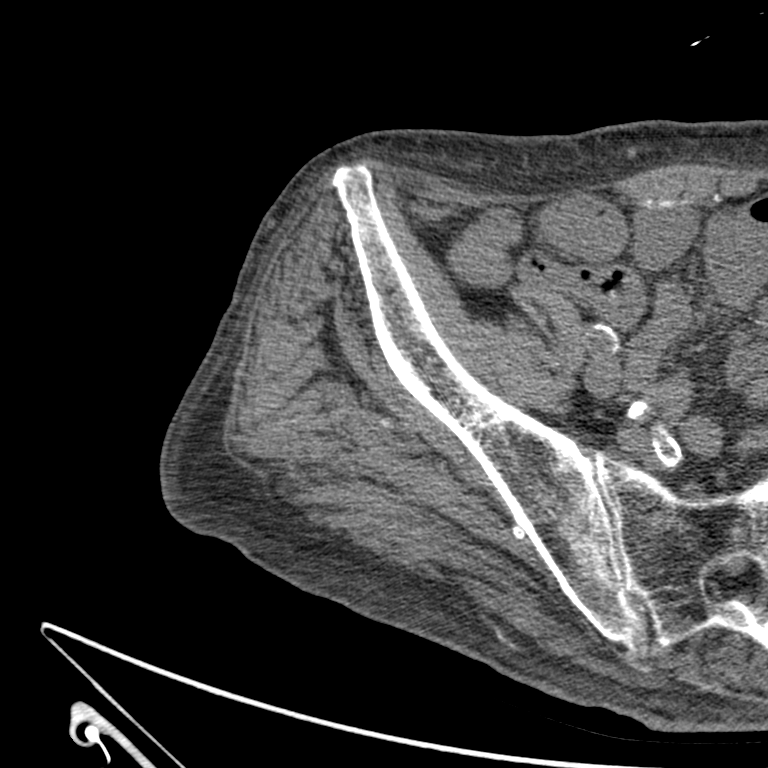

[Series 207: cor · coronal · 0.31mm/px · 3 of 69 slices shown]
[im 23/69  soft-tissue]
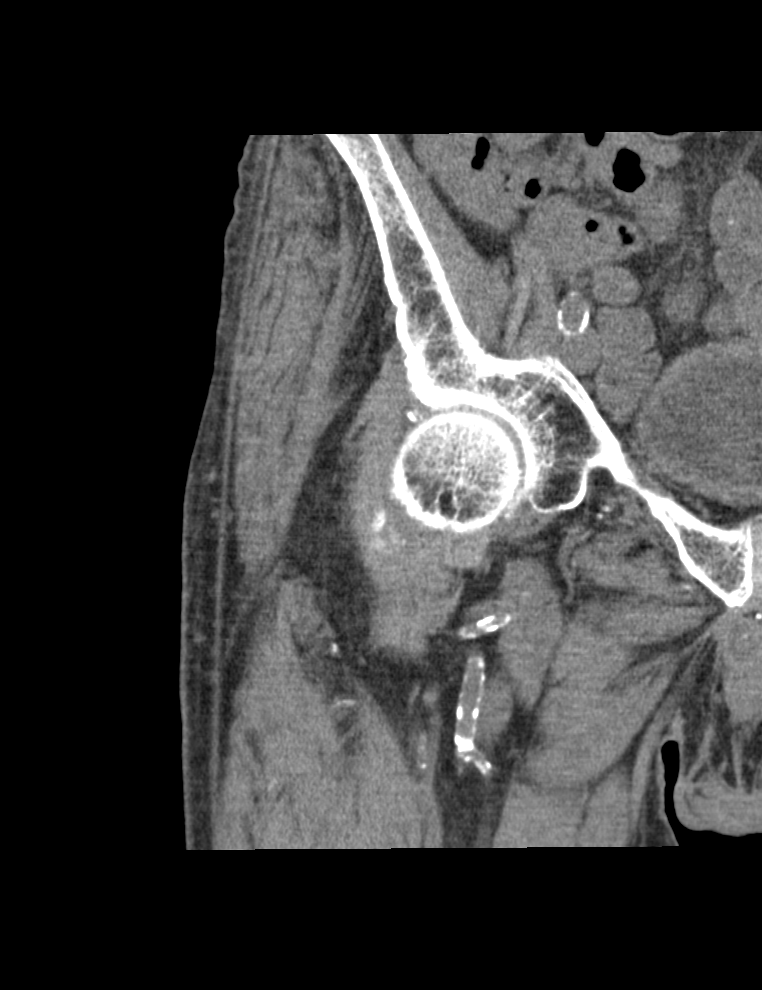
[im 31/69  soft-tissue]
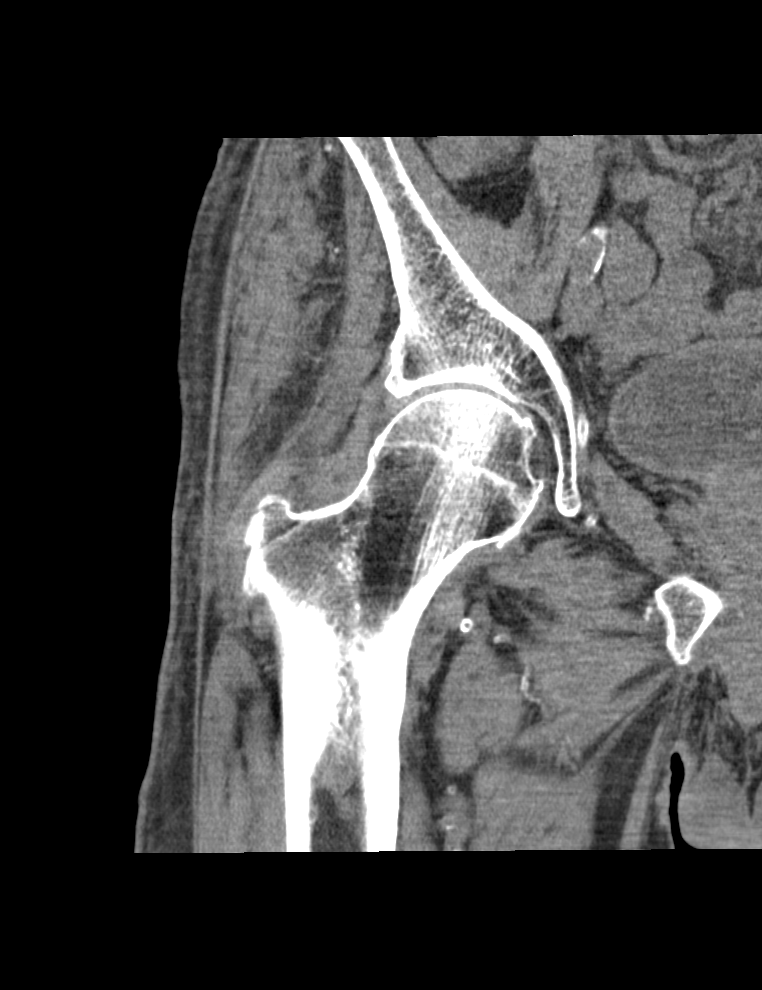
[im 38/69  soft-tissue]
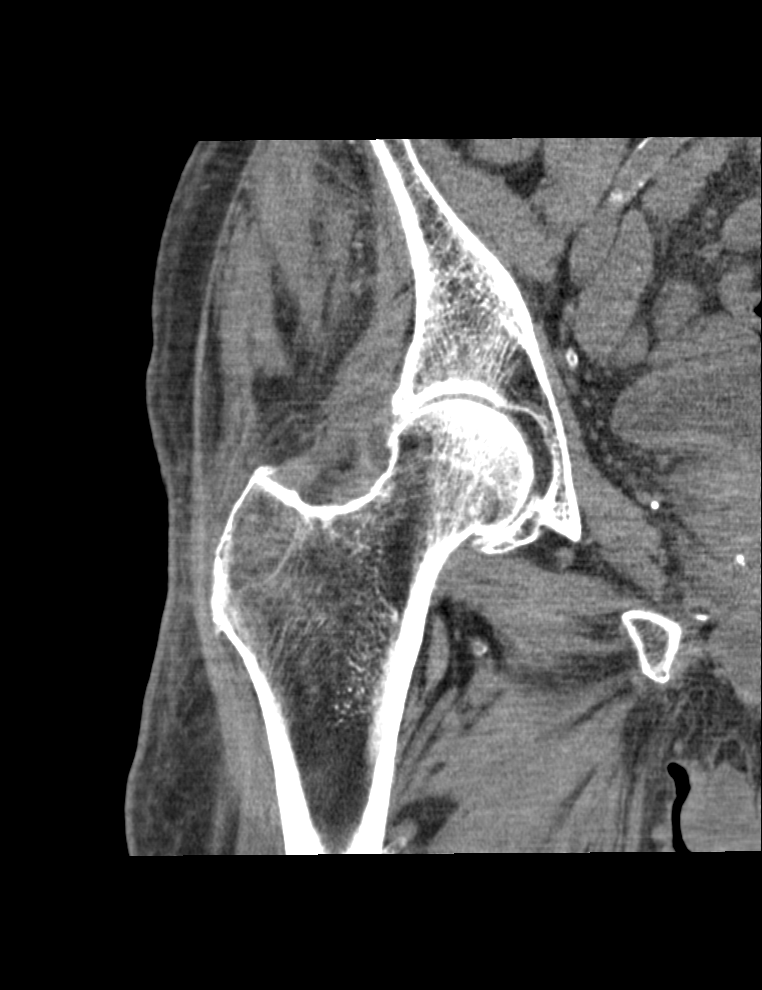

[7 of 46 positions shown; findings below may reference images not displayed]

FINDINGS: The cortical margins of the pelvis and right hip are intact. There
is no acute fracture. There is a vascular channel at the lateral
aspect of the greater trochanter. Osteoarthritis noted of the right
hip with joint space narrowing and osteophytes, similar to prior
exam. No evidence of fracture of the included pubic rami are sacrum.
Chronic tendinopathy at the hamstrings insertion. There is no large
hip joint effusion. Soft tissue edema appears to be related to
anasarca diminished from prior exam. Dense atherosclerotic vascular
calcifications are seen.
IMPRESSION: Osteoarthritis of the right hip. No CT findings of acute fracture.
MRI could be considered if there is persistent clinical concern for
fracture.

## 2016-08-07 ENCOUNTER — Encounter (HOSPITAL_COMMUNITY): Payer: Self-pay | Admitting: Emergency Medicine

## 2016-08-07 ENCOUNTER — Observation Stay (HOSPITAL_COMMUNITY)
Admission: EM | Admit: 2016-08-07 | Discharge: 2016-08-09 | Disposition: A | Discharge status: Home / Self Care | Payer: Medicare HMO | Attending: Internal Medicine | Admitting: Internal Medicine

## 2016-08-07 ENCOUNTER — Emergency Department (HOSPITAL_COMMUNITY): Payer: Medicare HMO

## 2016-08-07 DIAGNOSIS — Z66 Do not resuscitate: Secondary | ICD-10-CM | POA: Diagnosis not present

## 2016-08-07 DIAGNOSIS — N186 End stage renal disease: Secondary | ICD-10-CM | POA: Diagnosis not present

## 2016-08-07 DIAGNOSIS — F32A Depression, unspecified: Secondary | ICD-10-CM | POA: Diagnosis present

## 2016-08-07 DIAGNOSIS — F0280 Dementia in other diseases classified elsewhere without behavioral disturbance: Secondary | ICD-10-CM | POA: Diagnosis present

## 2016-08-07 DIAGNOSIS — E7800 Pure hypercholesterolemia, unspecified: Secondary | ICD-10-CM | POA: Diagnosis present

## 2016-08-07 DIAGNOSIS — I252 Old myocardial infarction: Secondary | ICD-10-CM | POA: Diagnosis not present

## 2016-08-07 DIAGNOSIS — E1129 Type 2 diabetes mellitus with other diabetic kidney complication: Secondary | ICD-10-CM | POA: Diagnosis present

## 2016-08-07 DIAGNOSIS — E039 Hypothyroidism, unspecified: Secondary | ICD-10-CM | POA: Insufficient documentation

## 2016-08-07 DIAGNOSIS — Z8673 Personal history of transient ischemic attack (TIA), and cerebral infarction without residual deficits: Secondary | ICD-10-CM | POA: Insufficient documentation

## 2016-08-07 DIAGNOSIS — Z992 Dependence on renal dialysis: Secondary | ICD-10-CM | POA: Diagnosis not present

## 2016-08-07 DIAGNOSIS — Z7982 Long term (current) use of aspirin: Secondary | ICD-10-CM | POA: Diagnosis not present

## 2016-08-07 DIAGNOSIS — E8889 Other specified metabolic disorders: Secondary | ICD-10-CM | POA: Insufficient documentation

## 2016-08-07 DIAGNOSIS — I10 Essential (primary) hypertension: Secondary | ICD-10-CM | POA: Insufficient documentation

## 2016-08-07 DIAGNOSIS — Z941 Heart transplant status: Secondary | ICD-10-CM | POA: Insufficient documentation

## 2016-08-07 DIAGNOSIS — I2510 Atherosclerotic heart disease of native coronary artery without angina pectoris: Secondary | ICD-10-CM | POA: Diagnosis not present

## 2016-08-07 DIAGNOSIS — Z79899 Other long term (current) drug therapy: Secondary | ICD-10-CM | POA: Insufficient documentation

## 2016-08-07 DIAGNOSIS — E1122 Type 2 diabetes mellitus with diabetic chronic kidney disease: Secondary | ICD-10-CM | POA: Insufficient documentation

## 2016-08-07 DIAGNOSIS — F329 Major depressive disorder, single episode, unspecified: Secondary | ICD-10-CM | POA: Diagnosis not present

## 2016-08-07 DIAGNOSIS — N390 Urinary tract infection, site not specified: Secondary | ICD-10-CM | POA: Diagnosis not present

## 2016-08-07 DIAGNOSIS — E118 Type 2 diabetes mellitus with unspecified complications: Secondary | ICD-10-CM | POA: Diagnosis not present

## 2016-08-07 DIAGNOSIS — R55 Syncope and collapse: Principal | ICD-10-CM | POA: Diagnosis present

## 2016-08-07 LAB — BASIC METABOLIC PANEL
Anion gap: 11 (ref 5–15)
BUN: 41 mg/dL — AB (ref 6–20)
CALCIUM: 8.5 mg/dL — AB (ref 8.9–10.3)
CO2: 29 mmol/L (ref 22–32)
CREATININE: 5.27 mg/dL — AB (ref 0.61–1.24)
Chloride: 97 mmol/L — ABNORMAL LOW (ref 101–111)
GFR calc non Af Amer: 10 mL/min — ABNORMAL LOW (ref >60)
GFR, EST AFRICAN AMERICAN: 11 mL/min — AB (ref >60)
GLUCOSE: 165 mg/dL — AB (ref 65–99)
Potassium: 4.9 mmol/L (ref 3.5–5.1)
Sodium: 137 mmol/L (ref 135–145)

## 2016-08-07 LAB — CBC
HCT: 33.9 % — ABNORMAL LOW (ref 39.0–52.0)
Hemoglobin: 10.2 g/dL — ABNORMAL LOW (ref 13.0–17.0)
MCH: 30 pg (ref 26.0–34.0)
MCHC: 30.1 g/dL (ref 30.0–36.0)
MCV: 99.7 fL (ref 78.0–100.0)
PLATELETS: 185 10*3/uL (ref 150–400)
RBC: 3.4 MIL/uL — ABNORMAL LOW (ref 4.22–5.81)
RDW: 15.9 % — AB (ref 11.5–15.5)
WBC: 6.4 10*3/uL (ref 4.0–10.5)

## 2016-08-07 LAB — GLUCOSE, CAPILLARY: GLUCOSE-CAPILLARY: 149 mg/dL — AB (ref 65–99)

## 2016-08-07 LAB — TROPONIN I

## 2016-08-07 LAB — D-DIMER, QUANTITATIVE: D-Dimer, Quant: 0.74 ug/mL-FEU — ABNORMAL HIGH (ref 0.00–0.50)

## 2016-08-07 MED ORDER — HEPARIN SODIUM (PORCINE) 5000 UNIT/ML IJ SOLN
5000.0000 [IU] | Freq: Three times a day (TID) | INTRAMUSCULAR | Status: DC
  Administered 2016-08-08 – 2016-08-09 (×5): 5000 [IU] via SUBCUTANEOUS
  Filled 2016-08-07 (×5): qty 1

## 2016-08-07 MED ORDER — FLUTICASONE PROPIONATE 50 MCG/ACT NA SUSP: 2.0000 | Freq: Every evening | NASAL | Status: DC | PRN

## 2016-08-07 MED ORDER — VITAMIN B-12 1000 MCG PO TABS
1000.0000 ug | ORAL_TABLET | Freq: Every day | ORAL | Status: DC
  Administered 2016-08-09: 1000 ug via ORAL
  Filled 2016-08-07: qty 1

## 2016-08-07 MED ORDER — RENA-VITE PO TABS
1.0000 | ORAL_TABLET | Freq: Every day | ORAL | Status: DC
  Administered 2016-08-09: 1 via ORAL
  Filled 2016-08-07: qty 1

## 2016-08-07 MED ORDER — ACETAMINOPHEN 650 MG RE SUPP: 650.0000 mg | Freq: Four times a day (QID) | RECTAL | Status: DC | PRN

## 2016-08-07 MED ORDER — PANTOPRAZOLE SODIUM 20 MG PO TBEC
20.0000 mg | DELAYED_RELEASE_TABLET | Freq: Every day | ORAL | Status: DC
  Administered 2016-08-08 – 2016-08-09 (×2): 20 mg via ORAL
  Filled 2016-08-07 (×2): qty 1

## 2016-08-07 MED ORDER — FLUOXETINE HCL 20 MG PO CAPS
20.0000 mg | ORAL_CAPSULE | Freq: Every day | ORAL | Status: DC
  Administered 2016-08-08 – 2016-08-09 (×2): 20 mg via ORAL
  Filled 2016-08-07 (×2): qty 1

## 2016-08-07 MED ORDER — MYCOPHENOLATE MOFETIL 250 MG PO CAPS
500.0000 mg | ORAL_CAPSULE | Freq: Every day | ORAL | Status: DC
  Administered 2016-08-08 – 2016-08-09 (×2): 500 mg via ORAL
  Filled 2016-08-07 (×2): qty 2

## 2016-08-07 MED ORDER — SODIUM CHLORIDE 0.9% FLUSH
3.0000 mL | Freq: Two times a day (BID) | INTRAVENOUS | Status: DC
  Administered 2016-08-08 – 2016-08-09 (×4): 3 mL via INTRAVENOUS

## 2016-08-07 MED ORDER — DONEPEZIL HCL 10 MG PO TABS
10.0000 mg | ORAL_TABLET | Freq: Every day | ORAL | Status: DC
  Administered 2016-08-07 – 2016-08-08 (×2): 10 mg via ORAL
  Filled 2016-08-07 (×2): qty 1

## 2016-08-07 MED ORDER — METOPROLOL TARTRATE 50 MG PO TABS: 75.0000 mg | ORAL_TABLET | ORAL | Status: DC

## 2016-08-07 MED ORDER — VITAMIN D 1000 UNITS PO TABS
1000.0000 [IU] | ORAL_TABLET | Freq: Every day | ORAL | Status: DC
Start: 2016-08-07 — End: 2016-08-09
  Administered 2016-08-08 – 2016-08-09 (×2): 1000 [IU] via ORAL
  Filled 2016-08-07 (×2): qty 1

## 2016-08-07 MED ORDER — CYCLOSPORINE MODIFIED (NEORAL) 25 MG PO CAPS
50.0000 mg | ORAL_CAPSULE | Freq: Two times a day (BID) | ORAL | Status: DC
  Administered 2016-08-07 – 2016-08-09 (×3): 50 mg via ORAL
  Filled 2016-08-07 (×5): qty 2

## 2016-08-07 MED ORDER — MYCOPHENOLATE MOFETIL 250 MG PO CAPS
250.0000 mg | ORAL_CAPSULE | Freq: Every day | ORAL | Status: DC
  Administered 2016-08-08 (×2): 250 mg via ORAL
  Filled 2016-08-07 (×3): qty 1

## 2016-08-07 MED ORDER — ACETAMINOPHEN 325 MG PO TABS
650.0000 mg | ORAL_TABLET | Freq: Four times a day (QID) | ORAL | Status: DC | PRN
  Administered 2016-08-08: 650 mg via ORAL
  Filled 2016-08-07: qty 2

## 2016-08-07 MED ORDER — HYDRALAZINE HCL 25 MG PO TABS
25.0000 mg | ORAL_TABLET | Freq: Three times a day (TID) | ORAL | Status: DC | PRN
  Administered 2016-08-08: 25 mg via ORAL
  Filled 2016-08-07: qty 1

## 2016-08-07 MED ORDER — SODIUM CHLORIDE 0.9 % IV SOLN: INTRAVENOUS | Status: DC

## 2016-08-07 MED ORDER — HYDRALAZINE HCL 25 MG PO TABS
75.0000 mg | ORAL_TABLET | Freq: Once | ORAL | Status: AC
  Administered 2016-08-07: 75 mg via ORAL
  Filled 2016-08-07: qty 3

## 2016-08-07 MED ORDER — LEVOTHYROXINE SODIUM 75 MCG PO TABS
75.0000 ug | ORAL_TABLET | Freq: Every day | ORAL | Status: DC
  Administered 2016-08-08 – 2016-08-09 (×2): 75 ug via ORAL
  Filled 2016-08-07 (×2): qty 1

## 2016-08-07 MED ORDER — ASPIRIN EC 81 MG PO TBEC
81.0000 mg | DELAYED_RELEASE_TABLET | Freq: Every day | ORAL | Status: DC
  Administered 2016-08-07 – 2016-08-08 (×2): 81 mg via ORAL
  Filled 2016-08-07 (×2): qty 1

## 2016-08-07 MED ORDER — PRAVASTATIN SODIUM 20 MG PO TABS
10.0000 mg | ORAL_TABLET | Freq: Every day | ORAL | Status: DC
  Administered 2016-08-07 – 2016-08-08 (×2): 10 mg via ORAL
  Filled 2016-08-07 (×3): qty 1

## 2016-08-07 MED ORDER — METOPROLOL TARTRATE 50 MG PO TABS
75.0000 mg | ORAL_TABLET | ORAL | Status: DC
  Administered 2016-08-08 – 2016-08-09 (×2): 75 mg via ORAL
  Filled 2016-08-07: qty 1

## 2016-08-07 MED ORDER — METOPROLOL TARTRATE 25 MG PO TABS
25.0000 mg | ORAL_TABLET | ORAL | Status: DC
  Administered 2016-08-08: 25 mg via ORAL
  Filled 2016-08-07 (×2): qty 1

## 2016-08-07 NOTE — H&P (Addendum)
History and Physical    [name] [name] [id number] DOB: [birth year] DOA: 08/07/2016  PCP: Junie Panning, NP  Patient coming from:   Home  Chief Complaint:  Passed out in shower  HPI: [name] [name] is a 75 y.o. male with a medical history significant for, but not  limited to, diabetes , end-stage renal disease on hemodialysis Tuesday, Thursday, Saturday. 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 Patient brought to ED after an episode of syncope this morning in the shower. Prior to getting in the shower patient felt nauseated but no other symptoms. Patient was sitting on the shower chair when he slumped over and lost consciousness for about 30 seconds. Patient had diarrhea last week, it subsided 2 days ago with wife made some dietary changes. Patient has chronic intermittent diarrhea for which he takes Lomotil.Marland Kitchen Despite Lomotil diarrhea did not cease. No associated abdominal pain. Did take Cipro a few weeks ago for a UTI.   ED Course:  Hypertensive with BP of 220/81, heart rate upper fifties to low sixties. Normal respirations and normal oxygen saturation on room air. Patient is afebrile.  WBC normal, hemoglobin 10 point 2. Glucose 165, troponin negative Chest X-ray negative for acute findings  Review of Systems: As per HPI, otherwise 10 point review of systems negative.    Past Medical History:  Diagnosis Date  . Anemia   . Cancer ([place])    SKIN  . CKD III-IV    a. since transplant in 2001 with significant worsening since 06/2013.  Marland Kitchen Coronary artery disease   . Dementia    mild  . Diabetes mellitus without complication (HCC)    FASTING CBG 90S  . Headache(784.0)   . Heart transplanted ([place])    a. 1995 CABG x 4 in [place], [place];  b. 2001 Developed CHF;  c. 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 10/2000 Cardiac transplant @ [facility], [place];  e. Historically nl Bx and stress testing.  Last stress test ~ 5 yrs ago.  Marland Kitchen Hernia of abdominal wall   . History of blood  transfusion   . History of stroke   . Hypercalcemia    a. 06/2013 Hospitalized in [place].  Marland Kitchen Hyperlipidemia   . Hypertension    a. Long h/o HTN, came off of all meds following transplant/wt loss-->meds resumed 06/2013, difficult to control since.  . Myocardial infarction ([place])    PRIOR TO HEART TRANSPLANT  . Orthostatic dizziness   . Orthostatic hypotension    a. Has tried support hose and compression stockings - prefers not to wear.  . Shortness of breath    EXERTION  . Stroke ([place]) 06/2013   NO RESIDUAL - WAS CONFUSED AT TIME - THAT HAS RESOLVED    Past Surgical History:  Procedure Laterality Date  . BASCILIC VEIN TRANSPOSITION Left 07/06/2014   Procedure: [place];  Surgeon: Rosetta Posner, MD;  Location: [facility];  Service: Vascular;  Laterality: Left;  . CARDIAC SURGERY    . CATARACT EXTRACTION    . CORONARY ARTERY BYPASS GRAFT    . 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 EYE SURGERY Bilateral    CATARACTS  . HEART TRANSPLANT    . SKIN GRAFT FULL THICKNESS LEG     cat scratch that did not heal  . VASECTOMY      Social  History   Social History  . Marital status: Married    Spouse name: N/A  . Number of children: 1  . Years of education: N/A   Occupational History  . Education officer, community    Social History Main Topics  . Smoking status: Never Smoker  . Smokeless tobacco: Never Used  . Alcohol use No  . Drug use: No  . Sexual activity: Not on file   Other Topics Concern  . Not on file   Social History Narrative   Patient is from [place].  Moved to [place] in late '90's.  Moved to [place] 06/20/2014 to live closer to son.  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  Plans to continue to work now that he is in [place].   Lives at home with wife. Patient has a cane, walker and wheelchair to use  for ambulation Allergies  Allergen Reactions  . Norvasc [Amlodipine Besylate] Swelling and Other (See Comments)    Swelling of the lower body   . Ace Inhibitors Other (See Comments)    On dialysis - cannot take these meds  . Angiotensin Receptor Blockers Other (See Comments)    On dialysis - cannot take these meds    Family History  Problem Relation Age of Onset  . Heart attack Father     died @ 33  . Diabetes Father   . Heart attack Mother   . Diabetes Mother   . Kidney disease Maternal Aunt     Prior to Admission medications   Medication Sig Start Date End Date Taking? Authorizing Provider  acetaminophen (TYLENOL) 325 MG tablet Take 2 tablets (650 mg total) by mouth every 6 (six) hours as needed for mild pain (or Fever >/= 101). Patient taking differently: Take 650 mg by mouth 2 (two) times daily.  10/14/14  Yes Delfina Redwood, MD  aspirin EC 81 MG tablet Take 81 mg by mouth at bedtime.   Yes Historical Provider, MD  cholecalciferol (VITAMIN D) 1000 UNITS tablet Take 1,000 Units by mouth daily.   Yes Historical Provider, MD  cycloSPORINE modified (NEORAL) 25 MG capsule Take 50 mg by mouth 2 (two) times daily.  08/18/14  Yes Lelon Perla, MD  diphenoxylate-atropine (LOMOTIL) 2.5-0.025 MG per tablet Take 1 tablet by mouth 2 (two) times daily as needed for diarrhea or loose stools.  03/01/15  Yes Historical Provider, MD  donepezil (ARICEPT) 10 MG tablet Take 10 mg by mouth at bedtime.  03/01/15  Yes Historical Provider, MD  FLUoxetine (PROZAC) 20 MG capsule Take 20 mg by mouth daily.    Yes Historical Provider, MD  fluticasone (FLONASE) 50 MCG/ACT nasal spray Place 2 sprays into both nostrils at bedtime as needed for allergies.  04/12/16  Yes Historical Provider, MD  hydrALAZINE (APRESOLINE) 25 MG tablet Take 25 mg by mouth See admin instructions. Take 1 tablet (25 mg) by mouth with a 50 mg tablet (75 mg dose) 3 times daily as needed for SBP >200 04/27/16  Yes Historical Provider, MD   hydrALAZINE (APRESOLINE) 50 MG tablet Take 50 mg by mouth See admin instructions. Take 1 tablet (50 mg) by mouth with a 25 mg tablet (75 mg dose) 3 times daily as needed for SBP >200 04/18/16  Yes Historical Provider, MD  lansoprazole (PREVACID) 30 MG capsule Take 30 mg by mouth See admin instructions. Take 1 capsule (30 mg) by mouth twice daily on Sunday, Monday, Wednesday, Friday (non-dialysis days) 12/22/15  Yes  Historical Provider, MD  levothyroxine (SYNTHROID, LEVOTHROID) 75 MCG tablet Take 75 mcg by mouth daily.    Yes Historical Provider, MD  metoprolol tartrate (LOPRESSOR) 25 MG tablet Take 2 tablets (50 mg total) by mouth 2 (two) times daily. Patient taking differently: Take 75 mg by mouth See admin instructions. Take 3 tablets (75 mg) by mouth twice daily on Sunday, Monday, Wednesday, Friday (non-dialysis days, may also take 1 tablet at bedtime on Tuesday, Thursday, Saturday for SBP >150 12/27/14  Yes Ripudeep K Rai, MD  multivitamin (RENA-VIT) TABS tablet Take 1 tablet by mouth daily.  03/08/15  Yes Historical Provider, MD  mycophenolate (CELLCEPT) 250 MG capsule Take 250 mg by mouth at bedtime. Also take 500 mg capsule in the morning   Yes Historical Provider, MD  mycophenolate (CELLCEPT) 500 MG tablet Take 500 mg by mouth daily. Also take 250 mg capsule at night   Yes Historical Provider, MD  pravastatin (PRAVACHOL) 10 MG tablet Take 10 mg by mouth at bedtime.    Yes Historical Provider, MD  Probiotic Product (PRO-BIOTIC BLEND) CAPS Take 1 capsule by mouth daily.   Yes Historical Provider, MD  vitamin B-12 (CYANOCOBALAMIN) 1000 MCG tablet Take 1,000 mcg by mouth daily with breakfast.   Yes Historical Provider, MD  oxyCODONE-acetaminophen (PERCOCET) 5-325 MG tablet Take 1 tablet by mouth every 4 (four) hours as needed. Patient not taking: Reported on 08/07/2016 06/27/16   Nat Christen, MD    Physical Exam: Vitals:   08/07/16 1345 08/07/16 1445 08/07/16 1530 08/07/16 1618  BP: 200/76 200/76 (!)  220/81 (!) 214/80  Pulse: (!) 58   (!) 58  Resp: 19 16 13 20   Temp:      TempSrc:      SpO2: 100%   100%    Constitutional:  Pleasant white male in NAD, calm, comfortable Vitals:   08/07/16 1345 08/07/16 1445 08/07/16 1530 08/07/16 1618  BP: 200/76 200/76 (!) 220/81 (!) 214/80  Pulse: (!) 58   (!) 58  Resp: 19 16 13 20   Temp:      TempSrc:      SpO2: 100%   100%   Eyes: PER, lids and conjunctivae normal ENMT: Mucous membranes are moist. Posterior pharynx clear of any exudate or lesions..  Neck: normal, supple, no masses Respiratory: clear to auscultation bilaterally, no wheezing, no crackles. Normal respiratory effort. No accessory muscle use.  Cardiovascular: Regular rate and rhythm, no murmurs / rubs / gallops. No extremity edema. 2+ dorsal pedis pulses.   Abdomen: no tenderness, no masses palpated. No hepatomegaly. Bowel sounds positive. Dialysis graft proximal left upper extremity Musculoskeletal: no clubbing / cyanosis. No joint deformity upper and lower extremities. Good ROM, no contractures. Normal muscle tone.  Skin: no rashes, lesions, ulcers.  Neurologic: CN 2-12 grossly intact. Sensation intact, Strength 5/5 in all 4.  Psychiatric: Normal judgment and insight. Alert and oriented x 3. Normal mood.   Labs on Admission: I have personally reviewed following labs and imaging studies  Radiological Exams on Admission: Dg Chest 2 View  Result Date: 08/07/2016 CLINICAL DATA:  No chest pain or SOB Evaluate for changes. Diagnosed with fractured patella today EXAM: CHEST  2 VIEW COMPARISON:  Radiographs 04/22/2015 and 05/04/2016. FINDINGS: The heart size and mediastinal contours are stable status post CABG. Right IJ central venous catheter has been removed. There is chronic scarring at the right lung base. The left lung is clear. There is no edema, pleural effusion or pneumothorax. Vascular surgical clips are  present in the left upper arm. There is an old healed fracture of the  proximal left humerus. IMPRESSION: Stable chest.  No acute cardiopulmonary process. Electronically Signed   By: Richardean Sale M.D.   On: 08/07/2016 16:01    EKG: Independently reviewed.   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 Etiology unclear but consider cardiac event vrs hypovolemia from recent diarrhea. Head CTscan not done. No neurologic deficits on exam. Patient had head CTscan after fall in August (no masses seen) -Place in OBS-tele -syncope order set utilized.  -Had echo in June, not repeating at this time -gentle IV hydration (ESRD) at 36ml/hr given diarrhea all last week. No diarrhea since Saturday but if recurs then will need stool studies.   ESRD on hemodialysis Tuesday, Thursday and Saturday. Dialyzed Saturday . -Patient due for dialysis tomorrow. Placing patient in OBS but if doesn't go home in am then will need to notify Renal to arrange for inpatient dialysis tomorrow. Requring [facility] for transportation to dialysis (can't fit in car since leg cannot be bent). Transportation to outpatient dialysis tomorrow may be problematic depending on when patient released from hospital.    Left knee fracture, wearing brace. Followed by Dr. Percell Miller. Requring [facility] for transportation to dialysis (can't fit in car since leg cannot be bent). .   Hypertension, maintained only on metoprolol. Blood pressure  elevated in the ED.  -Continue home metoprolol. Patient has Apresoline that he takes at home as needed. Will continue that for hypertension    History of CHF, status post heart transplant 2001. -continue home CellCept and cyclosporine  Dementia, has aide to assist at home but seems to fairly functional otherwise -Continue home Aricept  Depression, stable.  -Continue Prozac  .Hypothyroidism . -continue home Synthroid  DM2, diet controlled -carb modified diet -CBGs ac and HS  Hyperlipidemia.  . -continue home statin  DVT prophylaxis:      SQ Heparin  Code Status:     Full code  Family Communication:    Treatment plan discussed with wife in the room and she understands and agrees with the plan..  Disposition Plan:   Discharge home in 24 hours              Consults called:  None   Admission status:   Observation - Telemetry    Tye Savoy NP [facility] Pager [phone]  If 7PM-7AM, please contact night-coverage [email or web address] Password [email or web address]  08/07/2016, 5:14 PM    Attending MD note  Patient was seen, examined,treatment plan was discussed with the  Advance Practice Provider.  I have personally reviewed the clinical findings, lab,EKG, imaging studies and management of this patient in detail.I have also reviewed the orders written for this patient which were under my direction. I agree with the documentation, as recorded by the Advance Practice Provider.    Darcey Demma, Grayling Congress, MD  Family Medicine See Amion for pager # [facility]

## 2016-08-07 NOTE — ED Triage Notes (Signed)
Patient comes from home. Per EMS patient was in the shower sitting with home health nurse when he had a LOC. Patient endorses feeling nausea before the LOC however denies any nausea, vomiting, chest pain or Sob on arrival. Patient is alert and oriented x4

## 2016-08-07 NOTE — ED Notes (Signed)
MD aware of patients Orthost[facility]

## 2016-08-07 NOTE — ED Notes (Signed)
Attempted report x1. 

## 2016-08-07 NOTE — ED Provider Notes (Signed)
[facility] DEPT Provider Note   CSN: [id number] Arrival date & time: 08/07/16  1307     History   Chief Complaint Chief Complaint  Patient presents with  . Loss of Consciousness    HPI [name] [name] is a 75 y.o. male.   Loss of Consciousness   This is a new problem. The current episode started 1 to 2 hours ago. The problem occurs rarely. The problem has been resolved. He lost consciousness for a period of less than one minute. The problem is associated with normal activity. Associated symptoms include nausea. Pertinent negatives include abdominal pain, fever and visual change. He has tried nothing for the symptoms. The treatment provided no relief.    Past Medical History:  Diagnosis Date  . Anemia   . Cancer ([place])    SKIN  . CKD III-IV    a. since transplant in 2001 with significant worsening since 06/2013.  Marland Kitchen Coronary artery disease   . Dementia    mild  . Diabetes mellitus without complication (HCC)    FASTING CBG 90S  . Headache(784.0)   . 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 1995 CABG x 4 in [place], [place];  b. 2001 Developed CHF;  c. 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 10/2000 Cardiac transplant @ [facility], [place];  e. Historically nl Bx and stress testing.  Last stress test ~ 5 yrs ago.  Marland Kitchen Hernia of abdominal wall   . History of blood transfusion   . History of stroke   . Hypercalcemia    a. 06/2013 Hospitalized in [place].  Marland Kitchen Hyperlipidemia   . Hypertension    a. Long h/o HTN, came off of all meds following transplant/wt loss-->meds resumed 06/2013, difficult to control since.  . Myocardial infarction ([place])    PRIOR TO HEART TRANSPLANT  . Orthostatic dizziness   . Orthostatic hypotension    a. Has tried support hose and compression stockings - prefers not to wear.  . Shortness of breath    EXERTION  . Stroke ([place]) 06/2013   NO RESIDUAL - WAS CONFUSED AT TIME - THAT HAS RESOLVED    Patient Active Problem List   Diagnosis Date Noted  . Syncope 08/07/2016  .  Faintness   . Essential hypertension   . Acute blood loss anemia   . Cardiomyopathy ([place])   . Controlled diabetes mellitus type 2 with complications ([place])   . 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 Traumatic subdural hematoma ([place]) 05/04/2016  . 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 Subdural hematoma ([place]) 05/02/2016  . CKD (chronic kidney disease)   . Protein-calorie malnutrition, severe ([place]) 04/23/2015  . Leukocytosis 04/23/2015  . Anemia of chronic disease 04/23/2015  . Depression 04/23/2015  . UTI (lower urinary tract infection) 04/22/2015  . Barrett's esophagus 03/22/2015  . 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 DM (diabetes mellitus), type 2 with renal complications ([place]) [id number]  . Hypothyroidism 10/06/2014  . Hypertension 10/05/2014  . 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 Hypercholesterolemia 06/20/2014  . 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 BASCILIC VEIN TRANSPOSITION Left 07/06/2014   Procedure: [place];  Surgeon: Rosetta Posner, MD;  Location: [facility];  Service: Vascular;  Laterality: Left;  . CARDIAC SURGERY    . CATARACT EXTRACTION    . CORONARY ARTERY BYPASS GRAFT    . CRANIOTOMY Right 05/04/2016   Procedure: Right Craniotomy  for Evacuation of Subdural Hematoma;  Surgeon: Earnie Larsson, MD;  Location: [facility]URO ORS;  Service: Neurosurgery;  Laterality: Right;  . EYE SURGERY Bilateral    CATARACTS  . HEART TRANSPLANT    . SKIN GRAFT FULL THICKNESS LEG     cat scratch that did not heal  . VASECTOMY         Home Medications    Prior to Admission medications   Medication Sig Start Date End Date Taking? Authorizing Provider  acetaminophen (TYLENOL) 325 MG tablet Take 2 tablets (650 mg total) by mouth every 6 (six) hours as needed for mild pain (or Fever >/= 101). Patient taking differently: Take 650 mg by mouth 2 (two) times daily.   10/14/14  Yes Delfina Redwood, MD  aspirin EC 81 MG tablet Take 81 mg by mouth at bedtime.   Yes Historical Provider, MD  cholecalciferol (VITAMIN D) 1000 UNITS tablet Take 1,000 Units by mouth daily.   Yes Historical Provider, MD  cycloSPORINE modified (NEORAL) 25 MG capsule Take 50 mg by mouth 2 (two) times daily.  08/18/14  Yes Lelon Perla, MD  diphenoxylate-atropine (LOMOTIL) 2.5-0.025 MG per tablet Take 1 tablet by mouth 2 (two) times daily as needed for diarrhea or loose stools.  03/01/15  Yes Historical Provider, MD  donepezil (ARICEPT) 10 MG tablet Take 10 mg by mouth at bedtime.  03/01/15  Yes Historical Provider, MD  FLUoxetine (PROZAC) 20 MG capsule Take 20 mg by mouth daily.    Yes Historical Provider, MD  fluticasone (FLONASE) 50 MCG/ACT nasal spray Place 2 sprays into both nostrils at bedtime as needed for allergies.  04/12/16  Yes Historical Provider, MD  hydrALAZINE (APRESOLINE) 25 MG tablet Take 25 mg by mouth See admin instructions. Take 1 tablet (25 mg) by mouth with a 50 mg tablet (75 mg dose) 3 times daily as needed for SBP >200 04/27/16  Yes Historical Provider, MD  hydrALAZINE (APRESOLINE) 50 MG tablet Take 50 mg by mouth See admin instructions. Take 1 tablet (50 mg) by mouth with a 25 mg tablet (75 mg dose) 3 times daily as needed for SBP >200 04/18/16  Yes Historical Provider, MD  lansoprazole (PREVACID) 30 MG capsule Take 30 mg by mouth See admin instructions. Take 1 capsule (30 mg) by mouth twice daily on Sunday, Monday, Wednesday, Friday (non-dialysis days) 12/22/15  Yes Historical Provider, MD  levothyroxine (SYNTHROID, LEVOTHROID) 75 MCG tablet Take 75 mcg by mouth daily.    Yes Historical Provider, MD  metoprolol tartrate (LOPRESSOR) 25 MG tablet Take 2 tablets (50 mg total) by mouth 2 (two) times daily. Patient taking differently: Take 75 mg by mouth See admin instructions. Take 3 tablets (75 mg) by mouth twice daily on Sunday, Monday, Wednesday, Friday (non-dialysis days,  may also take 1 tablet at bedtime on Tuesday, Thursday, Saturday for SBP >150 12/27/14  Yes Ripudeep K Rai, MD  multivitamin (RENA-VIT) TABS tablet Take 1 tablet by mouth daily.  03/08/15  Yes Historical Provider, MD  mycophenolate (CELLCEPT) 250 MG capsule Take 250 mg by mouth at bedtime. Also take 500 mg capsule in the morning   Yes Historical Provider, MD  mycophenolate (CELLCEPT) 500 MG tablet Take 500 mg by mouth daily. Also take 250 mg capsule at night   Yes Historical Provider, MD  pravastatin (PRAVACHOL) 10 MG tablet Take 10 mg by mouth at bedtime.    Yes Historical Provider, MD  Probiotic Product (PRO-BIOTIC BLEND) CAPS Take 1 capsule by mouth  daily.   Yes Historical Provider, MD  vitamin B-12 (CYANOCOBALAMIN) 1000 MCG tablet Take 1,000 mcg by mouth daily with breakfast.   Yes Historical Provider, MD  oxyCODONE-acetaminophen (PERCOCET) 5-325 MG tablet Take 1 tablet by mouth every 4 (four) hours as needed. Patient not taking: Reported on 08/07/2016 06/27/16   Nat Christen, MD    Family History Family History  Problem Relation Age of Onset  . Heart attack Father     died @ 50  . Diabetes Father   . Heart attack Mother   . Diabetes Mother   . Kidney disease Maternal Aunt     Social History Social History  Substance Use Topics  . Smoking status: Never Smoker  . Smokeless tobacco: Never Used  . Alcohol use No     Allergies   Norvasc [amlodipine besylate]; Ace inhibitors; and Angiotensin receptor blockers   Review of Systems Review of Systems  Constitutional: Negative for fever.  Cardiovascular: Positive for syncope.  Gastrointestinal: Positive for nausea. Negative for abdominal pain.  All other systems reviewed and are negative.    Physical Exam Updated Vital Signs BP (!) 181/71 (BP Location: Right Arm)   Pulse 73   Temp 98 F (36.7 C) (Oral)   Resp 20   SpO2 100%   Physical Exam  Constitutional: He appears well-developed and well-nourished.  HENT:  Head:  Normocephalic and atraumatic.  Eyes: Conjunctivae are normal.  Neck: Neck supple.  Cardiovascular: Normal rate and regular rhythm.   No murmur heard. Pulmonary/Chest: Effort normal and breath sounds normal. No respiratory distress.  Abdominal: Soft. There is no tenderness.  Musculoskeletal: He exhibits no edema.  Neurological: He is alert.  Skin: Skin is warm and dry.  Psychiatric: He has a normal mood and affect.  Nursing note and vitals reviewed.    ED Treatments / Results  Labs (all labs ordered are listed, but only abnormal results are displayed) Labs Reviewed  BASIC METABOLIC PANEL - Abnormal; Notable for the following:       Result Value   Chloride 97 (*)    Glucose, Bld 165 (*)    BUN 41 (*)    Creatinine, Ser 5.27 (*)    Calcium 8.5 (*)    GFR calc non Af Amer 10 (*)    GFR calc Af Amer 11 (*)    All other components within normal limits  CBC - Abnormal; Notable for the following:    RBC 3.40 (*)    Hemoglobin 10.2 (*)    HCT 33.9 (*)    RDW 15.9 (*)    All other components within normal limits  URINALYSIS, ROUTINE W REFLEX MICROSCOPIC (NOT AT [facility]) - Abnormal; Notable for the following:    APPearance CLOUDY (*)    pH 8.5 (*)    Hgb urine dipstick SMALL (*)    Protein, ur 100 (*)    Leukocytes, UA LARGE (*)    All other components within normal limits  D-DIMER, QUANTITATIVE (NOT AT [facility]) - Abnormal; Notable for the following:    D-Dimer, Quant 0.74 (*)    All other components within normal limits  BASIC METABOLIC PANEL - Abnormal; Notable for the following:    Chloride 96 (*)    Glucose, Bld 108 (*)    BUN 46 (*)    Creatinine, Ser 5.83 (*)    Calcium 8.5 (*)    GFR calc non Af Amer 8 (*)    GFR calc Af Amer 10 (*)    All  other components within normal limits  CBC - Abnormal; Notable for the following:    RBC 3.18 (*)    Hemoglobin 9.5 (*)    HCT 31.2 (*)    RDW 15.7 (*)    All other components within normal limits  GLUCOSE, CAPILLARY - Abnormal;  Notable for the following:    Glucose-Capillary 149 (*)    All other components within normal limits  URINE MICROSCOPIC-ADD ON - Abnormal; Notable for the following:    Squamous Epithelial / LPF 0-5 (*)    Bacteria, UA RARE (*)    All other components within normal limits  TROPONIN I  GLUCOSE, CAPILLARY    EKG  EKG Interpretation  Date/Time:  Monday August 07 2016 13:13:47 EDT Ventricular Rate:  61 PR Interval:    QRS Duration: 123 QT Interval:  490 QTC Calculation: 494 R Axis:   74 Text Interpretation:  Sinus rhythm Right bundle branch block Confirmed by Bowen Kia MD, Corene Cornea [phone]) on 08/07/2016 1:38:19 PM       Radiology Dg Chest 2 View  Result Date: 08/07/2016 CLINICAL DATA:  No chest pain or SOB Evaluate for changes. Diagnosed with fractured patella today EXAM: CHEST  2 VIEW COMPARISON:  Radiographs 04/22/2015 and 05/04/2016. FINDINGS: The heart size and mediastinal contours are stable status post CABG. Right IJ central venous catheter has been removed. There is chronic scarring at the right lung base. The left lung is clear. There is no edema, pleural effusion or pneumothorax. Vascular surgical clips are present in the left upper arm. There is an old healed fracture of the proximal left humerus. IMPRESSION: Stable chest.  No acute cardiopulmonary process. Electronically Signed   By: Richardean Sale M.D.   On: 08/07/2016 16:01    Procedures Procedures (including critical care time)  Medications Ordered in ED Medications  aspirin EC tablet 81 mg (81 mg Oral Given 08/07/16 2359)  mycophenolate (CELLCEPT) capsule 500 mg (not administered)  fluticasone (FLONASE) 50 MCG/ACT nasal spray 2 spray (not administered)  pantoprazole (PROTONIX) EC tablet 20 mg (not administered)  donepezil (ARICEPT) tablet 10 mg (10 mg Oral Given 08/07/16 2359)  multivitamin (RENA-VIT) tablet 1 tablet (not administered)  cholecalciferol (VITAMIN D) tablet 1,000 Units (not administered)  cycloSPORINE  modified (NEORAL) capsule 50 mg (50 mg Oral Given 08/07/16 2357)  FLUoxetine (PROZAC) capsule 20 mg (not administered)  levothyroxine (SYNTHROID, LEVOTHROID) tablet 75 mcg (75 mcg Oral Given 08/08/16 0700)  mycophenolate (CELLCEPT) capsule 250 mg (250 mg Oral Given 08/08/16 0000)  pravastatin (PRAVACHOL) tablet 10 mg (10 mg Oral Given 08/07/16 2359)  vitamin B-12 (CYANOCOBALAMIN) tablet 1,000 mcg (not administered)  sodium chloride flush (NS) 0.9 % injection 3 mL (3 mLs Intravenous Given 08/08/16 0003)  heparin injection 5,000 Units (5,000 Units Subcutaneous Given 08/08/16 0700)  0.9 %  sodium chloride infusion (not administered)  acetaminophen (TYLENOL) tablet 650 mg (not administered)    Or  acetaminophen (TYLENOL) suppository 650 mg (not administered)  hydrALAZINE (APRESOLINE) tablet 25 mg (not administered)  metoprolol tartrate (LOPRESSOR) tablet 75 mg (75 mg Oral Given by Other 08/08/16 0156)  metoprolol tartrate (LOPRESSOR) tablet 25 mg (not administered)  hydrALAZINE (APRESOLINE) tablet 75 mg (75 mg Oral Given 08/07/16 1851)     Initial Impression / Assessment and Plan / ED Course  I have reviewed the triage vital signs and the nursing notes.  Pertinent labs & imaging results that were available during my care of the patient were reviewed by me and considered in my medical decision  making (see chart for details).  Clinical Course    75 yo M w/ syncopal episode at home. Workup relatively negative, however has a heart transplant, h/o CAD and is 75 yo. Was also orthostatic, so will plan for observation with medicine for further workup and management.   Final Clinical Impressions(s) / ED Diagnoses   Final diagnoses:  Syncope and collapse    New Prescriptions Current Discharge Medication List       Merrily Pew, MD 08/08/16 [phone]

## 2016-08-07 NOTE — ED Notes (Signed)
Called lab advised wanted to add to blood already sent lab states would add.

## 2016-08-07 NOTE — ED Notes (Signed)
Patient CBG 201 with EMS

## 2016-08-07 NOTE — ED Notes (Signed)
Patient denied any Sob, n/V or dizziness while othrrostaics being taken.

## 2016-08-08 ENCOUNTER — Encounter (HOSPITAL_COMMUNITY): Payer: Self-pay | Admitting: *Deleted

## 2016-08-08 DIAGNOSIS — N186 End stage renal disease: Secondary | ICD-10-CM | POA: Diagnosis not present

## 2016-08-08 DIAGNOSIS — F0280 Dementia in other diseases classified elsewhere without behavioral disturbance: Secondary | ICD-10-CM

## 2016-08-08 DIAGNOSIS — E1121 Type 2 diabetes mellitus with diabetic nephropathy: Secondary | ICD-10-CM

## 2016-08-08 DIAGNOSIS — E118 Type 2 diabetes mellitus with unspecified complications: Secondary | ICD-10-CM | POA: Diagnosis not present

## 2016-08-08 DIAGNOSIS — R55 Syncope and collapse: Secondary | ICD-10-CM | POA: Diagnosis not present

## 2016-08-08 DIAGNOSIS — I10 Essential (primary) hypertension: Secondary | ICD-10-CM | POA: Diagnosis not present

## 2016-08-08 LAB — URINALYSIS, ROUTINE W REFLEX MICROSCOPIC
Bilirubin Urine: NEGATIVE
Glucose, UA: NEGATIVE mg/dL
Ketones, ur: NEGATIVE mg/dL
Nitrite: NEGATIVE
PROTEIN: 100 mg/dL — AB
SPECIFIC GRAVITY, URINE: 1.014 (ref 1.005–1.030)
pH: 8.5 — ABNORMAL HIGH (ref 5.0–8.0)

## 2016-08-08 LAB — BASIC METABOLIC PANEL
ANION GAP: 12 (ref 5–15)
BUN: 46 mg/dL — ABNORMAL HIGH (ref 6–20)
CHLORIDE: 96 mmol/L — AB (ref 101–111)
CO2: 29 mmol/L (ref 22–32)
Calcium: 8.5 mg/dL — ABNORMAL LOW (ref 8.9–10.3)
Creatinine, Ser: 5.83 mg/dL — ABNORMAL HIGH (ref 0.61–1.24)
GFR calc Af Amer: 10 mL/min — ABNORMAL LOW (ref >60)
GFR calc non Af Amer: 8 mL/min — ABNORMAL LOW (ref >60)
GLUCOSE: 108 mg/dL — AB (ref 65–99)
POTASSIUM: 3.9 mmol/L (ref 3.5–5.1)
Sodium: 137 mmol/L (ref 135–145)

## 2016-08-08 LAB — RENAL FUNCTION PANEL
Albumin: 3.3 g/dL — ABNORMAL LOW (ref 3.5–5.0)
Anion gap: 13 (ref 5–15)
BUN: 15 mg/dL (ref 6–20)
CO2: 28 mmol/L (ref 22–32)
Calcium: 8.7 mg/dL — ABNORMAL LOW (ref 8.9–10.3)
Chloride: 95 mmol/L — ABNORMAL LOW (ref 101–111)
Creatinine, Ser: 2.69 mg/dL — ABNORMAL HIGH (ref 0.61–1.24)
GFR calc Af Amer: 25 mL/min — ABNORMAL LOW (ref >60)
GFR calc non Af Amer: 22 mL/min — ABNORMAL LOW (ref >60)
Glucose, Bld: 83 mg/dL (ref 65–99)
Phosphorus: 1.9 mg/dL — ABNORMAL LOW (ref 2.5–4.6)
Potassium: 3.9 mmol/L (ref 3.5–5.1)
Sodium: 136 mmol/L (ref 135–145)

## 2016-08-08 LAB — URINE MICROSCOPIC-ADD ON

## 2016-08-08 LAB — GLUCOSE, CAPILLARY
GLUCOSE-CAPILLARY: 80 mg/dL (ref 65–99)
Glucose-Capillary: 136 mg/dL — ABNORMAL HIGH (ref 65–99)
Glucose-Capillary: 95 mg/dL (ref 65–99)
Glucose-Capillary: 134 mg/dL — ABNORMAL HIGH (ref 65–99)

## 2016-08-08 LAB — CBC
HEMATOCRIT: 31.2 % — AB (ref 39.0–52.0)
HEMOGLOBIN: 9.5 g/dL — AB (ref 13.0–17.0)
MCH: 29.9 pg (ref 26.0–34.0)
MCHC: 30.4 g/dL (ref 30.0–36.0)
MCV: 98.1 fL (ref 78.0–100.0)
Platelets: 195 10*3/uL (ref 150–400)
RBC: 3.18 MIL/uL — ABNORMAL LOW (ref 4.22–5.81)
RDW: 15.7 % — ABNORMAL HIGH (ref 11.5–15.5)
WBC: 6.8 10*3/uL (ref 4.0–10.5)

## 2016-08-08 LAB — HEPATITIS B SURFACE ANTIGEN: Hepatitis B Surface Ag: NEGATIVE

## 2016-08-08 MED ORDER — SODIUM CHLORIDE 0.9 % IV SOLN
62.5000 mg | INTRAVENOUS | Status: DC
  Administered 2016-08-08: 62.5 mg via INTRAVENOUS
  Filled 2016-08-08: qty 5

## 2016-08-08 MED ORDER — HYDRALAZINE HCL 50 MG PO TABS
75.0000 mg | ORAL_TABLET | Freq: Three times a day (TID) | ORAL | Status: DC | PRN
  Administered 2016-08-08 – 2016-08-09 (×2): 75 mg via ORAL
  Filled 2016-08-08 (×2): qty 1

## 2016-08-08 MED ORDER — SODIUM CHLORIDE 0.9 % IV SOLN: 100.0000 mL | INTRAVENOUS | Status: DC | PRN

## 2016-08-08 MED ORDER — LIDOCAINE-PRILOCAINE 2.5-2.5 % EX CREA
1.0000 "application " | TOPICAL_CREAM | CUTANEOUS | Status: DC | PRN
  Filled 2016-08-08: qty 5

## 2016-08-08 MED ORDER — HYDRALAZINE HCL 50 MG PO TABS
50.0000 mg | ORAL_TABLET | Freq: Three times a day (TID) | ORAL | Status: DC
  Administered 2016-08-08: 50 mg via ORAL
  Filled 2016-08-08: qty 1

## 2016-08-08 MED ORDER — DIPHENOXYLATE-ATROPINE 2.5-0.025 MG PO TABS
2.0000 | ORAL_TABLET | Freq: Once | ORAL | Status: AC
  Administered 2016-08-08: 2 via ORAL
  Filled 2016-08-08: qty 2

## 2016-08-08 MED ORDER — LIDOCAINE HCL (PF) 1 % IJ SOLN: 5.0000 mL | INTRAMUSCULAR | Status: DC | PRN

## 2016-08-08 MED ORDER — DEXTROSE 5 % IV SOLN
1.0000 g | INTRAVENOUS | Status: DC
  Administered 2016-08-08 – 2016-08-09 (×2): 1 g via INTRAVENOUS
  Filled 2016-08-08 (×2): qty 10

## 2016-08-08 MED ORDER — PENTAFLUOROPROP-TETRAFLUOROETH EX AERO: 1.0000 "application " | INHALATION_SPRAY | CUTANEOUS | Status: DC | PRN

## 2016-08-08 MED ORDER — DARBEPOETIN ALFA 60 MCG/0.3ML IJ SOSY
PREFILLED_SYRINGE | INTRAMUSCULAR | Status: AC
  Filled 2016-08-08: qty 0.3

## 2016-08-08 MED ORDER — HEPARIN SODIUM (PORCINE) 1000 UNIT/ML DIALYSIS: 1000.0000 [IU] | INTRAMUSCULAR | Status: DC | PRN

## 2016-08-08 MED ORDER — INFLUENZA VAC SPLIT QUAD 0.5 ML IM SUSY
0.5000 mL | PREFILLED_SYRINGE | INTRAMUSCULAR | Status: AC
  Administered 2016-08-09: 0.5 mL via INTRAMUSCULAR
  Filled 2016-08-08: qty 0.5

## 2016-08-08 MED ORDER — INSULIN ASPART 100 UNIT/ML ~~LOC~~ SOLN: 0.0000 [IU] | Freq: Three times a day (TID) | SUBCUTANEOUS | Status: DC

## 2016-08-08 MED ORDER — DARBEPOETIN ALFA 60 MCG/0.3ML IJ SOSY
60.0000 ug | PREFILLED_SYRINGE | INTRAMUSCULAR | Status: DC
Start: 2016-08-15 — End: 2016-08-09
  Administered 2016-08-08: 60 ug via INTRAVENOUS

## 2016-08-08 MED ORDER — HYDRALAZINE HCL 25 MG PO TABS: 25.0000 mg | ORAL_TABLET | Freq: Three times a day (TID) | ORAL | Status: DC

## 2016-08-08 MED ORDER — ALTEPLASE 2 MG IJ SOLR: 2.0000 mg | Freq: Once | INTRAMUSCULAR | Status: DC | PRN

## 2016-08-08 MED ORDER — SODIUM CHLORIDE 0.9 % IV SOLN
INTRAVENOUS | Status: DC
Start: 2016-08-08 — End: 2016-08-08

## 2016-08-08 NOTE — Progress Notes (Signed)
BP repeated as instructed per MD.  At 1856, BP 218/76, HR 70.  Metoprolol 25mg  PO given.  MD instructed to obtain another set of vitals in 1 hour and call the PM MD to notify.  Reported this to PM nurse.

## 2016-08-08 NOTE — Progress Notes (Signed)
Dr. Grandville Silos states that he "does not write dialysis orders."  He states that he will contact a nephrologist for dialysis orders.

## 2016-08-08 NOTE — Progress Notes (Signed)
PROGRESS NOTE    [name] [name]  [id number] DOB: 04-03-1941 DOA: 08/07/2016 PCP: Junie Panning, NP    Brief Narrative:  [name] [name] is a 75 y.o. male with a medical history significant for, but not  limited to, diabetes , end-stage renal disease on hemodialysis Tuesday, Thursday, Saturday. 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 Patient brought to ED after an episode of syncope this morning in the shower. Prior to getting in the shower patient felt nauseated but no other symptoms. Patient was sitting on the shower chair when he slumped over and lost consciousness for about 30 seconds. Patient had diarrhea last week, it subsided 2 days ago with wife made some dietary changes. Patient has chronic intermittent diarrhea for which he takes Lomotil.Marland Kitchen Despite Lomotil diarrhea did not cease. No associated abdominal pain. Did take Cipro a few weeks ago for a UTI.     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 Patient with syncopal episode while sitting in the shower. Patient states water was running hot. Patient denies any further syncopal episodes. Patient denies any dizziness on sitting up. 2-D echo from 05/05/2016 with normal EF of 60-65% with no wall motion abnormalities. Check carotids. Patient also noted to have a UTI. Patient on IV Rocephin. Follow.  #2 UTI Check urine cultures. IV Rocephin.  #3 end-stage renal disease on hemodialysis Consult with nephrology for hemodialysis as patient does dialysis Tuesdays Thursdays Saturdays at [facility].  #3 type 2 diabetes mellitus Hemoglobin A1c 5.5. CBGs 80 -136. SSI.  #4 dementia Stable. Continue Aricept.  #5  depression Stable. Continue Prozac.  #6 hypothyroidism Continue Synthroid.  #7 hyperlipidemia Continue Pravachol.  #8 coronary artery disease/status post heart transplant Stable. Continue CellCept, cyclosporine, Lopressor.  #9 hypertension Continue home regimen of Lopressor, hydralazine, clonidine.    DVT prophylaxis: Heparin Code Status: Full Family Communication: Updated patient. No family at bedside. Disposition Plan: Home when medically stable and syncope workup done.   Consultants:   Nephrology: Pending  Procedures:   Chest x-ray 08/07/2016  Antimicrobials:   IV Rocephin 08/08/2016   Subjective: Patient denies any chest pain. No shortness of breath. Patient states he was sitting on this shower chair when he had a syncopal episode. Patient denies any dizziness on laying to sitting to standing.  Objective: Vitals:   08/07/16 1930 08/07/16 2000 08/07/16 2152 08/08/16 0831  BP: 182/69 166/70 (!) 181/71 (!) 217/67  Pulse: 71 73 73 61  Resp: 21 20 20 18   Temp:   98 F (36.7 C) 98 F (36.7 C)  TempSrc:   Oral Oral  SpO2: 100% 100% 100% 100%    Intake/Output Summary (Last 24 hours) at 08/08/16 1203 Last data filed at 08/08/16 1115  Gross per 24 hour  Intake              600 ml  Output               75 ml  Net              525 ml   There were no vitals filed for this visit.  Examination:  General exam: Appears calm and comfortable  Respiratory system: Clear  to auscultation. Respiratory effort normal. Cardiovascular system: S1 & S2 heard, RRR. No JVD, murmurs, rubs, gallops or clicks. No pedal edema. Gastrointestinal system: Abdomen is nondistended, soft and nontender. No organomegaly or masses felt. Normal bowel sounds heard. Central nervous system: Alert and oriented. No focal neurological deficits. Extremities: Symmetric 5 x 5 power. Skin: No rashes, lesions or ulcers Psychiatry: Judgement and insight appear normal. Mood & affect appropriate.      Data Reviewed: I have personally reviewed following labs and imaging studies  CBC:  Recent Labs Lab 08/07/16 1317 08/08/16 0243  WBC 6.4 6.8  HGB 10.2* 9.5*  HCT 33.9* 31.2*  MCV 99.7 98.1  PLT 185 0000000   Basic Metabolic Panel:  Recent Labs Lab 08/07/16 1317 08/08/16 0243  NA 137 137  K 4.9 3.9  CL 97* 96*  CO2 29 29  GLUCOSE 165* 108*  BUN 41* 46*  CREATININE 5.27* 5.83*  CALCIUM 8.5* 8.5*   GFR: CrCl cannot be calculated (Unknown ideal weight.). Liver Function Tests: No results for input(s): AST, ALT, ALKPHOS, BILITOT, PROT, ALBUMIN in the last 168 hours. No results for input(s): LIPASE, AMYLASE in the last 168 hours. No results for input(s): AMMONIA in the last 168 hours. Coagulation Profile: No results for input(s): INR, PROTIME in the last 168 hours. Cardiac Enzymes:  Recent Labs Lab 08/07/16 1317  TROPONINI <0.03   BNP (last 3 results) No results for input(s): PROBNP in the last 8760 hours. HbA1C: No results for input(s): HGBA1C in the last 72 hours. CBG:  Recent Labs Lab 08/07/16 2227 08/08/16 0726  GLUCAP 149* 80   Lipid Profile: No results for input(s): CHOL, HDL, LDLCALC, TRIG, CHOLHDL, LDLDIRECT in the last 72 hours. Thyroid Function Tests: No results for input(s): TSH, T4TOTAL, FREET4, T3FREE, THYROIDAB in the last 72 hours. Anemia Panel: No results for input(s): VITAMINB12, FOLATE, FERRITIN, TIBC, IRON, RETICCTPCT in the last 72 hours. Sepsis Labs: No results for input(s): PROCALCITON, LATICACIDVEN in the last 168 hours.  No results found for this or any previous visit (from the past 240 hour(s)).       Radiology Studies: Dg Chest 2 View  Result Date: 08/07/2016 CLINICAL DATA:  No chest pain or SOB Evaluate for changes. Diagnosed with fractured patella today EXAM: CHEST  2 VIEW COMPARISON:  Radiographs 04/22/2015 and 05/04/2016. FINDINGS: The heart size and mediastinal contours are stable status post CABG. Right IJ central  venous catheter has been removed. There is chronic scarring at the right lung base. The left lung is clear. There is no edema, pleural effusion or pneumothorax. Vascular surgical clips are present in the left upper arm. There is an old healed fracture of the proximal left humerus. IMPRESSION: Stable chest.  No acute cardiopulmonary process. Electronically Signed   By: Richardean Sale M.D.   On: 08/07/2016 16:01        Scheduled Meds: . aspirin EC  81 mg Oral QHS  . cefTRIAXone (ROCEPHIN)  IV  1 g Intravenous Q24H  . cholecalciferol  1,000 Units Oral Daily  . cycloSPORINE modified  50 mg Oral BID  . donepezil  10 mg Oral QHS  . FLUoxetine  20 mg Oral Daily  . heparin  5,000 Units Subcutaneous Q8H  . hydrALAZINE  25 mg Oral Q8H  . levothyroxine  75 mcg Oral QAC breakfast  . metoprolol tartrate  25 mg Oral Q T,Th,Sat-1800  . metoprolol tartrate  75 mg Oral 2 times per day on Sun Mon Wed Fri  .  multivitamin  1 tablet Oral Daily  . mycophenolate  250 mg Oral QHS  . mycophenolate  500 mg Oral Daily  . pantoprazole  20 mg Oral Daily  . pravastatin  10 mg Oral QHS  . sodium chloride flush  3 mL Intravenous Q12H  . vitamin B-12  1,000 mcg Oral Q breakfast   Continuous Infusions:    LOS: 0 days    Time spent: 35 minutes as    Khizar Fiorella, MD [facility] Pager [phone]  If 7PM-7AM, please contact night-coverage [email or web address] Password [facility] 08/08/2016, 12:03 PM

## 2016-08-08 NOTE — Progress Notes (Signed)
HD called to give report prior to pt arriving back on [facility] after dialysis.  At 1750, BP 214/91, HR 71.  AT 1813, HD staff gave pt hydralazine 75mg  PO.   Dr. Grandville Silos aware.   When pt arrives back on [facility], we will repeat BP, and notify MD via text page, and await any further orders.

## 2016-08-08 NOTE — Consult Note (Signed)
[facility] Renal Consultation Note    Indication for Consultation:  Management of ESRD/hemodialysis; anemia, hypertension/volume and secondary hyperparathyroidism PCP:  HPI: [name] [name] is a 75 y.o. male with ESRD on hemodialysis T, Th, S at [facility]. PMH of CABG 1995, Heart transplant at [facility], [place], [place], [place], CVA, craniotomy for evacuation of SDH 05/04/2016, falls, orthostatic hypotension, L nondisplaced patellar fx 06/2016. Bilateral hearing aides, very [place]. H/O mild dementia.   Patient was brought to ED after syncopal episode in shower at home 08/07/16. Per patient and wife, patient was in shower with HHA and "just went out". Per wife, patient was unresponsive, drowling and she called EMS. States that in about 5 minutes, he started to arouse. Did not hit head or sustain any other injury during episode. He was brought to [facility] for evaluation. No CT of head was done as pt had no neuro deficits, last CT of head 08/17 showed no acute abnormalities.  CXR was unremarkable, K+ 4.9 HGB 10.2 WBC 6.4 D Dimer 0.74. Troponin 0.03. Patient has been admitted as observation patient for syncope. We have been asked to manage his dialysis.   Per wife, patient has had diarrhea in the past week, but no other symptoms. Patient has had ongoing issues with falls and orthostatic hypotension. Wife says he takes metoprololol 75 mg PO BID on non-dialysis days, holds dose if systolic BP < [id number] and holds AM dose on dialysis days. Says she only gives hydralazine if SBP 200. Since patient has been unable to stand 2/2 to patellar fx, these have been supine BPs.  Patient is compliant with HD treatments, last treatment 08/05/16. He left 1 kg under EDW. He does not have high IDWG, is compliant with meds.   Past Medical History:  Diagnosis Date  . Anemia   . Cancer ([place])    SKIN  . CKD III-IV    a. since transplant in 2001 with significant worsening since 06/2013.  Marland Kitchen  Coronary artery disease   . Dementia    mild  . Diabetes mellitus without complication (HCC)    FASTING CBG 90S  . Headache(784.0)   . Heart transplanted ([place])    a. 1995 CABG x 4 in [place], [place];  b. 2001 Developed CHF;  c. 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 10/2000 Cardiac transplant @ [facility], [place];  e. Historically nl Bx and stress testing.  Last stress test ~ 5 yrs ago.  Marland Kitchen Hernia of abdominal wall   . History of blood transfusion   . History of stroke   . Hypercalcemia    a. 06/2013 Hospitalized in [place].  Marland Kitchen Hyperlipidemia   . Hypertension    a. Long h/o HTN, came off of all meds following transplant/wt loss-->meds resumed 06/2013, difficult to control since.  . Myocardial infarction ([place])    PRIOR TO HEART TRANSPLANT  . Orthostatic dizziness   . Orthostatic hypotension    a. Has tried support hose and compression stockings - prefers not to wear.  . Shortness of breath    EXERTION  . Stroke ([place]) 06/2013   NO RESIDUAL - WAS CONFUSED AT TIME - THAT HAS RESOLVED   Past Surgical History:  Procedure Laterality Date  . BASCILIC VEIN TRANSPOSITION Left 07/06/2014   Procedure: [place];  Surgeon: Rosetta Posner, MD;  Location: [place];  Service: Vascular;  Laterality: Left;  . CARDIAC SURGERY    . CATARACT EXTRACTION    . CORONARY ARTERY BYPASS GRAFT    .  CRANIOTOMY Right 05/04/2016   Procedure: Right Craniotomy for Evacuation of Subdural Hematoma;  Surgeon: Earnie Larsson, MD;  Location: [facility] NEURO ORS;  Service: Neurosurgery;  Laterality: Right;  . EYE SURGERY Bilateral    CATARACTS  . HEART TRANSPLANT    . SKIN GRAFT FULL THICKNESS LEG     cat scratch that did not heal  . VASECTOMY     Family History  Problem Relation Age of Onset  . Heart attack Father     died @ 62  . Diabetes Father   . Heart attack Mother   . Diabetes Mother   . Kidney disease Maternal Aunt    Social History:  reports that he has never smoked. He has never used smokeless tobacco. He  reports that he does not drink alcohol or use drugs. Allergies  Allergen Reactions  . Norvasc [Amlodipine Besylate] Swelling and Other (See Comments)    Swelling of the lower body   . Ace Inhibitors Other (See Comments)    On dialysis - cannot take these meds  . Angiotensin Receptor Blockers Other (See Comments)    On dialysis - cannot take these meds   Prior to Admission medications   Medication Sig Start Date End Date Taking? Authorizing Provider  acetaminophen (TYLENOL) 325 MG tablet Take 2 tablets (650 mg total) by mouth every 6 (six) hours as needed for mild pain (or Fever >/= 101). Patient taking differently: Take 650 mg by mouth 2 (two) times daily.  10/14/14  Yes Delfina Redwood, MD  aspirin EC 81 MG tablet Take 81 mg by mouth at bedtime.   Yes Historical Provider, MD  cholecalciferol (VITAMIN D) 1000 UNITS tablet Take 1,000 Units by mouth daily.   Yes Historical Provider, MD  cycloSPORINE modified (NEORAL) 25 MG capsule Take 50 mg by mouth 2 (two) times daily.  08/18/14  Yes Lelon Perla, MD  diphenoxylate-atropine (LOMOTIL) 2.5-0.025 MG per tablet Take 1 tablet by mouth 2 (two) times daily as needed for diarrhea or loose stools.  03/01/15  Yes Historical Provider, MD  donepezil (ARICEPT) 10 MG tablet Take 10 mg by mouth at bedtime.  03/01/15  Yes Historical Provider, MD  FLUoxetine (PROZAC) 20 MG capsule Take 20 mg by mouth daily.    Yes Historical Provider, MD  fluticasone (FLONASE) 50 MCG/ACT nasal spray Place 2 sprays into both nostrils at bedtime as needed for allergies.  04/12/16  Yes Historical Provider, MD  hydrALAZINE (APRESOLINE) 25 MG tablet Take 25 mg by mouth See admin instructions. Take 1 tablet (25 mg) by mouth with a 50 mg tablet (75 mg dose) 3 times daily as needed for SBP >200 04/27/16  Yes Historical Provider, MD  hydrALAZINE (APRESOLINE) 50 MG tablet Take 50 mg by mouth See admin instructions. Take 1 tablet (50 mg) by mouth with a 25 mg tablet (75 mg dose) 3 times  daily as needed for SBP >200 04/18/16  Yes Historical Provider, MD  lansoprazole (PREVACID) 30 MG capsule Take 30 mg by mouth See admin instructions. Take 1 capsule (30 mg) by mouth twice daily on Sunday, Monday, Wednesday, Friday (non-dialysis days) 12/22/15  Yes Historical Provider, MD  levothyroxine (SYNTHROID, LEVOTHROID) 75 MCG tablet Take 75 mcg by mouth daily.    Yes Historical Provider, MD  metoprolol tartrate (LOPRESSOR) 25 MG tablet Take 2 tablets (50 mg total) by mouth 2 (two) times daily. Patient taking differently: Take 75 mg by mouth See admin instructions. Take 3 tablets (75 mg) by mouth twice  daily on Sunday, Monday, Wednesday, Friday (non-dialysis days, may also take 1 tablet at bedtime on Tuesday, Thursday, Saturday for SBP >150 12/27/14  Yes Ripudeep K Rai, MD  multivitamin (RENA-VIT) TABS tablet Take 1 tablet by mouth daily.  03/08/15  Yes Historical Provider, MD  mycophenolate (CELLCEPT) 250 MG capsule Take 250 mg by mouth at bedtime. Also take 500 mg capsule in the morning   Yes Historical Provider, MD  mycophenolate (CELLCEPT) 500 MG tablet Take 500 mg by mouth daily. Also take 250 mg capsule at night   Yes Historical Provider, MD  pravastatin (PRAVACHOL) 10 MG tablet Take 10 mg by mouth at bedtime.    Yes Historical Provider, MD  Probiotic Product (PRO-BIOTIC BLEND) CAPS Take 1 capsule by mouth daily.   Yes Historical Provider, MD  vitamin B-12 (CYANOCOBALAMIN) 1000 MCG tablet Take 1,000 mcg by mouth daily with breakfast.   Yes Historical Provider, MD  oxyCODONE-acetaminophen (PERCOCET) 5-325 MG tablet Take 1 tablet by mouth every 4 (four) hours as needed. Patient not taking: Reported on 08/07/2016 06/27/16   Nat Christen, MD   Current Facility-Administered Medications  Medication Dose Route Frequency Provider Last Rate Last Dose  . acetaminophen (TYLENOL) tablet 650 mg  650 mg Oral Q6H PRN Willia Craze, NP       Or  . acetaminophen (TYLENOL) suppository 650 mg  650 mg Rectal Q6H  PRN Willia Craze, NP      . aspirin EC tablet 81 mg  81 mg Oral QHS Willia Craze, NP   81 mg at 08/07/16 2359  . cefTRIAXone (ROCEPHIN) 1 g in dextrose 5 % 50 mL IVPB  1 g Intravenous Q24H Eugenie Filler, MD   Stopped at 08/08/16 1010  . cholecalciferol (VITAMIN D) tablet 1,000 Units  1,000 Units Oral Daily Willia Craze, NP   Stopped at 08/08/16 1010  . cycloSPORINE modified (NEORAL) capsule 50 mg  50 mg Oral BID Willia Craze, NP   50 mg at 08/07/16 2357  . donepezil (ARICEPT) tablet 10 mg  10 mg Oral QHS Willia Craze, NP   10 mg at 08/07/16 2359  . FLUoxetine (PROZAC) capsule 20 mg  20 mg Oral Daily Willia Craze, NP      . fluticasone [facility]) 50 MCG/ACT nasal spray 2 spray  2 spray Each Nare QHS PRN Willia Craze, NP      . heparin injection 5,000 Units  5,000 Units Subcutaneous Q8H Willia Craze, NP   5,000 Units at 08/08/16 0700  . hydrALAZINE (APRESOLINE) tablet 25 mg  25 mg Oral TID PRN Willia Craze, NP   25 mg at 08/08/16 0847  . hydrALAZINE (APRESOLINE) tablet 25 mg  25 mg Oral Q8H Eugenie Filler, MD      . levothyroxine (SYNTHROID, LEVOTHROID) tablet 75 mcg  75 mcg Oral QAC breakfast Willia Craze, NP   75 mcg at 08/08/16 0700  . metoprolol tartrate (LOPRESSOR) tablet 25 mg  25 mg Oral Q T,Th,Sat-1800 Waldemar Dickens, MD      . metoprolol tartrate (LOPRESSOR) tablet 75 mg  75 mg Oral 2 times per day on Sun Mon Wed Fri Waldemar Dickens, MD   75 mg at 08/08/16 0156  . multivitamin (RENA-VIT) tablet 1 tablet  1 tablet Oral Daily Willia Craze, NP      . mycophenolate (CELLCEPT) capsule 250 mg  250 mg Oral QHS Willia Craze, NP   250 mg at  08/08/16 0000  . mycophenolate (CELLCEPT) capsule 500 mg  500 mg Oral Daily Willia Craze, NP      . pantoprazole (PROTONIX) EC tablet 20 mg  20 mg Oral Daily Willia Craze, NP      . pravastatin (PRAVACHOL) tablet 10 mg  10 mg Oral QHS Willia Craze, NP   10 mg at 08/07/16 2359  . sodium chloride  flush (NS) 0.9 % injection 3 mL  3 mL Intravenous Q12H Willia Craze, NP   3 mL at 08/08/16 0003  . vitamin B-12 (CYANOCOBALAMIN) tablet 1,000 mcg  1,000 mcg Oral Q breakfast Willia Craze, NP       Labs: Basic Metabolic Panel:  Recent Labs Lab 08/07/16 1317 08/08/16 0243  NA 137 137  K 4.9 3.9  CL 97* 96*  CO2 29 29  GLUCOSE 165* 108*  BUN 41* 46*  CREATININE 5.27* 5.83*  CALCIUM 8.5* 8.5*   CBC:  Recent Labs Lab 08/07/16 1317 08/08/16 0243  WBC 6.4 6.8  HGB 10.2* 9.5*  HCT 33.9* 31.2*  MCV 99.7 98.1  PLT 185 195   Cardiac Enzymes:  Recent Labs Lab 08/07/16 1317  TROPONINI <0.03   CBG:  Recent Labs Lab 08/07/16 2227 08/08/16 0726  GLUCAP 149* 80   Studies/Results: Dg Chest 2 View  Result Date: 08/07/2016 CLINICAL DATA:  No chest pain or SOB Evaluate for changes. Diagnosed with fractured patella today EXAM: CHEST  2 VIEW COMPARISON:  Radiographs 04/22/2015 and 05/04/2016. FINDINGS: The heart size and mediastinal contours are stable status post CABG. Right IJ central venous catheter has been removed. There is chronic scarring at the right lung base. The left lung is clear. There is no edema, pleural effusion or pneumothorax. Vascular surgical clips are present in the left upper arm. There is an old healed fracture of the proximal left humerus. IMPRESSION: Stable chest.  No acute cardiopulmonary process. Electronically Signed   By: Richardean Sale M.D.   On: 08/07/2016 16:01    ROS: As per HPI otherwise negative.  Physical Exam: Vitals:   08/07/16 1930 08/07/16 2000 08/07/16 2152 08/08/16 0831  BP: 182/69 166/70 (!) 181/71 (!) 217/67  Pulse: 71 73 73 61  Resp: 21 20 20 18   Temp:   98 F (36.7 C) 98 F (36.7 C)  TempSrc:   Oral Oral  SpO2: 100% 100% 100% 100%     General: Elderly, somewhat frail appearing gentleman in NAD. Head: Normocephalic, atraumatic, sclera non-icteric, mucus membranes are moist Neck: Supple. JVD not elevated. Lungs: Clear  bilaterally to auscultation without wheezes, rales, or rhonchi. Breathing is unlabored. Heart: RRR with S1 S2. No murmurs, rubs, or gallops appreciated. Abdomen: Soft, non-tender, non-distended with normoactive bowel sounds. No rebound/guarding. No obvious abdominal masses. M-S:  Strength and tone appear normal for age. Lower extremities:without edema or ischemic changes, no open wounds. Brace in place LLE.  Neuro: Alert and oriented X 3. Moves all extremities spontaneously. Psych:  Responds to questions appropriately with a normal affect. Dialysis Access:  Dialysis Orders: [facility] T,Th,S 4 hours 400/Auto 1.5 69 kg 2.0 K/2.25 Ca UF profile 2 NO HEPARIN Hectoral 1 mcg IV q tx (Last PTH 118 07/13/16) Venofer 50 mg IV q weekly (Fe 95 Tsat 41% 07/13/16) Mircera 100 mcg IV (Recently restarted-was supposed to be given today. Last HGB 11.0 08/03/16) Calcium Acetate 667 mg 1 cap PO TID AC (Last phos 4.8 07/13/16)  Assessment/Plan: 1.  Syncopal episode: Cardiac event vs hypovolemia from  diarrhea. Per primary. On telemetry unit, no further diarrhea. No further syncopal episodes.  2.  ESRD - T, Th, S [facility]. Will have HD today on schedule. K+ 3.9. Use 3.0K bath 3.  Hypertension/volume  -Patient takes metoprolol 75 mg PO BID, hydralazine 75 mg PO PRN systolic BP > A999333. Pre wt 69.1 standing. Has leg brace LLE. Per dialysis unit-subtract 2 kg for leg brace. Leg brace weighed 0.9 kg. Actual pt wt 68 kg-1 kg under EDW. Run even today.  4.  Anemia  - HGB 9.5. Give Aranesp 60 mcg IV today, give weekly Fe. Follow HGB.  5.  Metabolic bone disease - Cont VDRA, binders. 6.  Nutrition - renal diet, nepro, renal vit.  Rita H. Owens Shark, NP-C 08/08/2016, 1:40 PM  [facility] [phone]  Pt seen, examined, agree w assess/plan as above with additions as indicated. ESRD pt w heart transplant, chronic orthostatic hypotensions from cardiac denervation who presents with syncopal episode.  Had recent  diarrheal episode and is below dry wt, will give IVF's overnight.  No other suggestions, looks stable now.  HD today, no fluid off.  [name] MD [employer] pager [phone]    cell [phone] 08/08/2016, 4:06 PM

## 2016-08-08 NOTE — Progress Notes (Signed)
Amion text paged Dr. Grandville Silos, requesting HD orders, as pt's HD schedule is Tues, Thurs, Sat.  Currently, no orders present.

## 2016-08-08 NOTE — Procedures (Signed)
  I was present at this dialysis session, have reviewed the session itself and made  appropriate changes [name] MD [place] pager [phone]    cell [phone] 08/08/2016, 4:20 PM

## 2016-08-09 ENCOUNTER — Observation Stay (HOSPITAL_COMMUNITY): Payer: Medicare HMO

## 2016-08-09 DIAGNOSIS — E118 Type 2 diabetes mellitus with unspecified complications: Secondary | ICD-10-CM | POA: Diagnosis not present

## 2016-08-09 DIAGNOSIS — N390 Urinary tract infection, site not specified: Secondary | ICD-10-CM

## 2016-08-09 DIAGNOSIS — R55 Syncope and collapse: Secondary | ICD-10-CM | POA: Diagnosis not present

## 2016-08-09 DIAGNOSIS — I10 Essential (primary) hypertension: Secondary | ICD-10-CM | POA: Diagnosis not present

## 2016-08-09 DIAGNOSIS — N186 End stage renal disease: Secondary | ICD-10-CM | POA: Diagnosis not present

## 2016-08-09 LAB — GLUCOSE, CAPILLARY
GLUCOSE-CAPILLARY: 107 mg/dL — AB (ref 65–99)
GLUCOSE-CAPILLARY: 128 mg/dL — AB (ref 65–99)
Glucose-Capillary: 117 mg/dL — ABNORMAL HIGH (ref 65–99)

## 2016-08-09 LAB — CBC
HCT: 31.5 % — ABNORMAL LOW (ref 39.0–52.0)
Hemoglobin: 9.6 g/dL — ABNORMAL LOW (ref 13.0–17.0)
MCH: 30 pg (ref 26.0–34.0)
MCHC: 30.5 g/dL (ref 30.0–36.0)
MCV: 98.4 fL (ref 78.0–100.0)
PLATELETS: 193 10*3/uL (ref 150–400)
RBC: 3.2 MIL/uL — ABNORMAL LOW (ref 4.22–5.81)
RDW: 15.9 % — AB (ref 11.5–15.5)
WBC: 5.9 10*3/uL (ref 4.0–10.5)

## 2016-08-09 LAB — RENAL FUNCTION PANEL
Albumin: 2.9 g/dL — ABNORMAL LOW (ref 3.5–5.0)
Anion gap: 9 (ref 5–15)
BUN: 20 mg/dL (ref 6–20)
CHLORIDE: 97 mmol/L — AB (ref 101–111)
CO2: 29 mmol/L (ref 22–32)
CREATININE: 3.74 mg/dL — AB (ref 0.61–1.24)
Calcium: 8.5 mg/dL — ABNORMAL LOW (ref 8.9–10.3)
GFR calc Af Amer: 17 mL/min — ABNORMAL LOW (ref >60)
GFR, EST NON AFRICAN AMERICAN: 15 mL/min — AB (ref >60)
Glucose, Bld: 96 mg/dL (ref 65–99)
Phosphorus: 2.6 mg/dL (ref 2.5–4.6)
Potassium: 4.4 mmol/L (ref 3.5–5.1)
Sodium: 135 mmol/L (ref 135–145)

## 2016-08-09 MED ORDER — CEFUROXIME AXETIL 250 MG PO TABS: 250.0000 mg | ORAL_TABLET | Freq: Every day | ORAL | 0 refills | Status: AC

## 2016-08-09 MED ORDER — HYDRALAZINE HCL 50 MG PO TABS
75.0000 mg | ORAL_TABLET | Freq: Three times a day (TID) | ORAL | Status: DC
  Administered 2016-08-09 (×2): 75 mg via ORAL
  Filled 2016-08-09 (×2): qty 1

## 2016-08-09 MED ORDER — DIPHENOXYLATE-ATROPINE 2.5-0.025 MG PO TABS: 2.0000 | ORAL_TABLET | Freq: Four times a day (QID) | ORAL | Status: DC | PRN

## 2016-08-09 NOTE — Progress Notes (Signed)
[facility] Progress Note   Subjective: no c/o  Vitals:   08/09/16 0027 08/09/16 0625 08/09/16 1300 08/09/16 1421  BP: (!) 186/56 (!) 176/62 (!) 214/68 (!) 178/69  Pulse: 71 68 (!) 59 65  Resp:   20 20  Temp: 98.5 F (36.9 C) 97.7 F (36.5 C) 97.4 F (36.3 C)   TempSrc: Oral Oral Oral   SpO2: 100% 98% 100%   Weight:  69.3 kg (152 lb 12.5 oz)    Height:        Inpatient medications: . aspirin EC  81 mg Oral QHS  . cefTRIAXone (ROCEPHIN)  IV  1 g Intravenous Q24H  . cholecalciferol  1,000 Units Oral Daily  . cycloSPORINE modified  50 mg Oral BID  . [START ON 08/15/2016] darbepoetin (ARANESP) injection - DIALYSIS  60 mcg Intravenous Q Tue-HD  . donepezil  10 mg Oral QHS  . [START ON 08/15/2016] ferric gluconate (FERRLECIT/NULECIT) IV  62.5 mg Intravenous Q Tue-HD  . FLUoxetine  20 mg Oral Daily  . heparin  5,000 Units Subcutaneous Q8H  . hydrALAZINE  75 mg Oral TID  . insulin aspart  0-9 Units Subcutaneous TID WC  . levothyroxine  75 mcg Oral QAC breakfast  . metoprolol tartrate  25 mg Oral Q T,Th,Sat-1800  . metoprolol tartrate  75 mg Oral 2 times per day on Sun Mon Wed Fri  . multivitamin  1 tablet Oral Daily  . mycophenolate  250 mg Oral QHS  . mycophenolate  500 mg Oral Daily  . pantoprazole  20 mg Oral Daily  . pravastatin  10 mg Oral QHS  . sodium chloride flush  3 mL Intravenous Q12H  . vitamin B-12  1,000 mcg Oral Q breakfast     acetaminophen **OR** acetaminophen, diphenoxylate-atropine, fluticasone, hydrALAZINE  Exam: General: Elderly, somewhat frail appearing gentleman in NAD. Head: Normocephalic, atraumatic, sclera non-icteric, mucus membranes are moist Neck: Supple. JVD not elevated. Lungs: Clear bilaterally to auscultation without wheezes, rales, or rhonchi. Breathing is unlabored. Heart: RRR with S1 S2. No murmurs, rubs, or gallops appreciated. Abdomen: Soft, non-tender, non-distended with normoactive bowel sounds. No rebound/guarding. No  obvious abdominal masses. M-S:  Strength and tone appear normal for age. Lower extremities:without edema or ischemic changes, no open wounds. Brace in place LLE.  Neuro: Alert and oriented X 3. Moves all extremities spontaneously. Psych:  Responds to questions appropriately with a normal affect. Dialysis Access:  Dialysis Orders: [facility] T,Th,S 4 hours 400/Auto 1.5 69 kg 2.0 K/2.25 Ca UF profile 2 NO HEPARIN Hectoral 1 mcg IV q tx (Last PTH 118 07/13/16) Venofer 50 mg IV q weekly (Fe 95 Tsat 41% 07/13/16) Mircera 100 mcg IV (Recently restarted-was supposed to be given today. Last HGB 11.0 08/03/16) Calcium Acetate 667 mg 1 cap PO TID AC (Last phos 4.8 07/13/16)  Assessment/Plan: 1.  Syncopal episode: this is likely his chronic orthostatic hypotension (complication of heart transplant) in assoc w vagal effects of toileting.   2. Hypertension - he has chronic supine permissive HTN which is necessary given his significant problems with orthostatic HYPOtension.  At home he is not treated for supine HTN until the SBP is over 200.  These high BP's are not unusual or worrisome for this pt. Have d/w primary MD.   3. ESRD - TTS HD 4.  Hypertension/volume  -Patient takes metoprolol 75 mg PO BID, and also hydralazine 75 mg PO PRN systolic BP > A999333.  5. Anemia  - HGB 9.5. Give Aranesp  60 mcg IV today, give weekly Fe. Follow HGB.  6. Metabolic bone disease - Cont VDRA, binders. 7. Nutrition - renal diet, nepro, renal vit.  Plan - ok for dc from renal standpoint.  Would keep his home BP regimen as it is, he is a complicated patient and this is followed by his heart Tx doctor at [facility].  If here tomorrow will plan HD.     [name] MD [facility] pager [phone]    cell [phone] 08/09/2016, 3:53 PM    Recent Labs Lab 08/08/16 0243 08/08/16 1914 08/09/16 0453  NA 137 136 135  K 3.9 3.9 4.4  CL 96* 95* 97*  CO2 29 28 29   GLUCOSE 108* 83 96  BUN 46* 15 20  CREATININE 5.83*  2.69* 3.74*  CALCIUM 8.5* 8.7* 8.5*  PHOS  --  1.9* 2.6    Recent Labs Lab 08/08/16 1914 08/09/16 0453  ALBUMIN 3.3* 2.9*    Recent Labs Lab 08/07/16 1317 08/08/16 0243 08/09/16 0453  WBC 6.4 6.8 5.9  HGB 10.2* 9.5* 9.6*  HCT 33.9* 31.2* 31.5*  MCV 99.7 98.1 98.4  PLT 185 195 193   Iron/TIBC/Ferritin/ %Sat    Component Value Date/Time   IRON 38 (L) 08/25/2014 1831   TIBC 143 (L) 08/25/2014 1831   FERRITIN 612 (H) 08/25/2014 1831   IRONPCTSAT 27 08/25/2014 1831

## 2016-08-09 NOTE — Discharge Summary (Signed)
Physician Discharge Summary  [name] [name] [id number] DOB: 11/19/1941 DOA: 08/07/2016  PCP: Junie Panning, NP  Admit date: 08/07/2016 Discharge date: 08/09/2016  Time spent: 65 minutes  Recommendations for Outpatient Follow-up:  1. Follow-up at hemodialysis center as scheduled tomorrow 08/10/2016 2. Follow-up with Smothers, Andree Elk, NP 08/16/2016.   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 male with a medical history significant for, but not  limited to, diabetes , end-stage renal disease on hemodialysis Tuesday, Thursday, Saturday. 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 Patient brought to ED after an episode of syncope this morning in the shower. Prior to getting in the shower patient felt nauseated but no other symptoms. Patient was sitting on the shower chair when he slumped over and lost consciousness for about 30 seconds. Patient had diarrhea last week, it subsided 2 days ago with wife made some dietary changes. Patient has chronic intermittent diarrhea for which he takes Lomotil.Marland Kitchen Despite Lomotil diarrhea did not cease. No associated abdominal pain. Did take Cipro a few weeks ago for a UTI.   ED Course:  Hypertensive with BP of 220/81, heart rate upper fifties to low sixties. Normal respirations and normal oxygen saturation on room air. Patient is afebrile.  WBC normal,  hemoglobin 10 point 2. Glucose 165, troponin negative Chest X-ray negative for acute findings Hospital Course:  #1 syncope Questionable etiology. Likely secondary to chronic orthostatic hypotension (complication of heart transplant) in association with vagal effects of toileting. Patient with syncopal episode while sitting in the shower. Patient states water was running hot. Patient was admitted for syncopal workup. Patient did not have any further syncopal episodes during the hospitalization. Patient denied any dizziness on sitting up. 2-D echo from 05/05/2016 with normal EF of 60-65% with no wall motion abnormalities. Patient was also placed empirically on antibiotics for UTI. Patient remained stable during the hospitalization and does have a chronic supine permissive hypertension which is necessary given his orthostatic hypotension. Patient remained in stable condition be discharged home in stable and improved condition. Outpatient follow-up with PCP.   #2 UTI On admission urinalysis which was done was concerning for UTI and a such due to patient's syncopal episode and history of heart transplant on immunosuppressive therapy patient was placed empirically on IV Rocephin while urine cultures were pending. Urine cultures came back positive for greater than 100,000 Staphylococcus species and a such patient was discharged home on 3 more days of oral Ceftin to complete a course of antibiotic treatment. Outpatient follow-up.  #3 end-stage renal disease on hemodialysis Patient was seen in consultation by nephrology and underwent hemodialysis during the hospitalization and will follow-up in the outpatient hemodialysis unit as scheduled for his usual hemodialysis..  #3 type 2 diabetes mellitus Hemoglobin A1c 5.5. CBGs 80 -136. SSI.  #4 dementia Stable. Continued on Aricept.  #5 depression Stable. Continued on  Prozac.  #6 hypothyroidism Continued on home regimen of Synthroid.  #7  hyperlipidemia Continued on home regimen of Pravachol.  #8 coronary artery disease/status post heart transplant Stable. Continued on home regimen of CellCept, cyclosporine, Lopressor.  #9 hypertension Continued on home regimen of Lopressor, hydralazine, clonidine.   Procedures:  CXR 08/07/2016  Consultations:  Nephrology  Discharge Exam: Vitals:   08/09/16 1300 08/09/16 1421  BP: (!) 214/68 (!) 178/69  Pulse: (!) 59 65  Resp: 20 20  Temp: 97.4 F (36.3 C)     General: NAD Cardiovascular: RRR Respiratory: CTAB  Discharge Instructions   Discharge Instructions    Diet - low sodium heart healthy    Complete by:  As directed    Discharge instructions    Complete by:  As directed    Follow up in HD tomorrow as scheduled.   Increase activity slowly    Complete by:  As directed      Current Discharge Medication List    START taking these medications   Details  cefUROXime (CEFTIN) 250 MG tablet Take 1 tablet (250 mg total) by mouth daily. Qty: 3 tablet, Refills: 0      CONTINUE these medications which have NOT CHANGED   Details  acetaminophen (TYLENOL) 325 MG tablet Take 2 tablets (650 mg total) by mouth every 6 (six) hours as needed for mild pain (or Fever >/= 101).    aspirin EC 81 MG tablet Take 81 mg by mouth at bedtime.    cholecalciferol (VITAMIN D) 1000 UNITS tablet Take 1,000 Units by mouth daily.    cycloSPORINE modified (NEORAL) 25 MG capsule Take 50 mg by mouth 2 (two) times daily.     diphenoxylate-atropine (LOMOTIL) 2.5-0.025 MG per tablet Take 1 tablet by mouth 2 (two) times daily as needed for diarrhea or loose stools.     donepezil (ARICEPT) 10 MG tablet Take 10 mg by mouth at bedtime.     FLUoxetine (PROZAC) 20 MG capsule Take 20 mg by mouth daily.     fluticasone (FLONASE) 50 MCG/ACT nasal spray Place 2 sprays into both nostrils at bedtime as needed for allergies.     !! hydrALAZINE (APRESOLINE) 25 MG tablet Take 25 mg by mouth See  admin instructions. Take 1 tablet (25 mg) by mouth with a 50 mg tablet (75 mg dose) 3 times daily as needed for SBP >200    !! hydrALAZINE (APRESOLINE) 50 MG tablet Take 50 mg by mouth See admin instructions. Take 1 tablet (50 mg) by mouth with a 25 mg tablet (75 mg dose) 3 times daily as needed for SBP >200    lansoprazole (PREVACID) 30 MG capsule Take 30 mg by mouth See admin instructions. Take 1 capsule (30 mg) by mouth twice daily on Sunday, Monday, Wednesday, Friday (non-dialysis days)    levothyroxine (SYNTHROID, LEVOTHROID) 75 MCG tablet Take 75 mcg by mouth daily.     metoprolol tartrate (LOPRESSOR) 25 MG tablet Take 2 tablets (50 mg total) by mouth 2 (two) times daily. Qty: 120 tablet, Refills: 0    multivitamin (RENA-VIT) TABS tablet Take 1 tablet by mouth daily.  Refills: 5    mycophenolate (CELLCEPT) 250 MG capsule Take 250 mg by mouth at bedtime. Also take 500 mg capsule in the morning    mycophenolate (CELLCEPT) 500 MG tablet Take 500 mg by mouth daily. Also take 250 mg capsule at night    pravastatin (PRAVACHOL) 10 MG tablet Take 10 mg by mouth at bedtime.  Probiotic Product (PRO-BIOTIC BLEND) CAPS Take 1 capsule by mouth daily.    vitamin B-12 (CYANOCOBALAMIN) 1000 MCG tablet Take 1,000 mcg by mouth daily with breakfast.    oxyCODONE-acetaminophen (PERCOCET) 5-325 MG tablet Take 1 tablet by mouth every 4 (four) hours as needed. Qty: 20 tablet, Refills: 0     !! - Potential duplicate medications found. Please discuss with provider.     Allergies  Allergen Reactions  . Norvasc [Amlodipine Besylate] Swelling and Other (See Comments)    Swelling of the lower body   . Ace Inhibitors Other (See Comments)    On dialysis - cannot take these meds  . Angiotensin Receptor Blockers Other (See Comments)    On dialysis - cannot take these meds   Follow-up Information    Smothers, Andree Elk, NP On 08/16/2016.   Specialty:  Nurse Practitioner Why:  AT 9:50am for hospital  follow up Contact information: [address] [place] [address] [phone]        [facility] On 08/10/2016.   Why:  f/u for usual HD. Contact information: [place] [address] [phone]            The results of significant diagnostics from this hospitalization (including imaging, microbiology, ancillary and laboratory) are listed below for reference.    Significant Diagnostic Studies: Dg Chest 2 View  Result Date: 08/07/2016 CLINICAL DATA:  No chest pain or SOB Evaluate for changes. Diagnosed with fractured patella today EXAM: CHEST  2 VIEW COMPARISON:  Radiographs 04/22/2015 and 05/04/2016. FINDINGS: The heart size and mediastinal contours are stable status post CABG. Right IJ central venous catheter has been removed. There is chronic scarring at the right lung base. The left lung is clear. There is no edema, pleural effusion or pneumothorax. Vascular surgical clips are present in the left upper arm. There is an old healed fracture of the proximal left humerus. IMPRESSION: Stable chest.  No acute cardiopulmonary process. Electronically Signed   By: Richardean Sale M.D.   On: 08/07/2016 16:01   Mr Brain Wo Contrast  Result Date: 08/09/2016 CLINICAL DATA:  75 year old male with syncope. Dialysis patient. Hypertensive, systolic [id number]. Initial encounter. EXAM: MRI HEAD WITHOUT CONTRAST TECHNIQUE: Multiplanar, multiecho pulse sequences of the brain and surrounding structures were obtained without intravenous contrast. COMPARISON:  Head and cervical spine CT 06/27/2016 and earlier. 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 Patchy and confluent cerebral white matter T2 and FLAIR hyperintensity which in places resembles chronic white matter lacunar infarcts. Moderate T2 heterogeneity in the deep gray matter nuclei with a chronic lacunar infarcts suspected in the right caudate.  Fairly numerous scattered chronic micro hemorrhages in the brainstem, cerebellum, and both cerebral hemispheres. The largest areas of hemosiderin are in the right basal ganglia and right cerebellum. 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 No restricted diffusion or evidence of acute infarction. Previous right frontotemporal craniotomy. No midline shift, mass effect, evidence of mass lesion, ventriculomegaly, extra-axial collection or acute intracranial hemorrhage. Cervicomedullary junction and pituitary are within normal limits. Vascular: Major intracranial vascular flow voids are preserved. Skull and upper cervical spine: Negative. Normal bone marrow signal. Sinuses/Orbits: Postoperative changes to both globes. No acute orbit soft tissue findings. Trace paranasal sinus mucosal thickening. Mild left mastoid effusion. Negative nasopharynx ; tiny right side retention cyst (series 5, image 5). Other: Negative scalp soft tissues. IMPRESSION: 1.  No acute intracranial abnormality. 2. Advanced chronic small vessel disease, including numerous chronic micro hemorrhages in the brain. 3. Previous right frontotemporal craniotomy. 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 Electronically Signed   By: Genevie Ann M.D.   On: 08/09/2016 12:43    Microbiology: Recent Results (from the past 240 hour(s))  Urine culture     Status: Abnormal (Preliminary result)   Collection Time: 08/08/16  9:30 AM  Result Value Ref Range Status   Specimen Description URINE, RANDOM  Final   Special Requests NONE  Final   Culture (A)  Final    >=100,000 COLONIES/mL STAPHYLOCOCCUS SPECIES (COAGULASE NEGATIVE) SUSCEPTIBILITIES TO FOLLOW    Report Status PENDING  Incomplete     Labs: Basic Metabolic Panel:  Recent Labs Lab 08/07/16 1317 08/08/16 0243 08/08/16 1914 08/09/16 0453  NA 137 137 136 135  K 4.9 3.9 3.9 4.4  CL 97* 96* 95* 97*  CO2 29  29 28 29   GLUCOSE 165* 108* 83 96  BUN 41* 46* 15 20  CREATININE 5.27* 5.83* 2.69* 3.74*  CALCIUM 8.5* 8.5* 8.7* 8.5*  PHOS  --   --  1.9* 2.6   Liver Function Tests:  Recent Labs Lab 08/08/16 1914 08/09/16 0453  ALBUMIN 3.3* 2.9*   No results for input(s): LIPASE, AMYLASE in the last 168 hours. No results for input(s): AMMONIA in the last 168 hours. CBC:  Recent Labs Lab 08/07/16 1317 08/08/16 0243 08/09/16 0453  WBC 6.4 6.8 5.9  HGB 10.2* 9.5* 9.6*  HCT 33.9* 31.2* 31.5*  MCV 99.7 98.1 98.4  PLT 185 195 193   Cardiac Enzymes:  Recent Labs Lab 08/07/16 1317  TROPONINI <0.03   BNP: BNP (last 3 results) No results for input(s): BNP in the last 8760 hours.  ProBNP (last 3 results) No results for input(s): PROBNP in the last 8760 hours.  CBG:  Recent Labs Lab 08/08/16 1857 08/08/16 2232 08/09/16 0610 08/09/16 1243 08/09/16 1631  GLUCAP 95 134* 107* 117* 128*       Signed:  THOMPSON,DANIEL MD.  [facility] 08/09/2016, 4:40 PM

## 2016-08-10 LAB — URINE CULTURE

## 2016-08-14 DIAGNOSIS — N390 Urinary tract infection, site not specified: Secondary | ICD-10-CM

## 2016-08-15 DIAGNOSIS — R55 Syncope and collapse: Secondary | ICD-10-CM | POA: Diagnosis not present

## 2016-08-22 ENCOUNTER — Inpatient Hospital Stay (HOSPITAL_COMMUNITY)
Admission: EM | Admit: 2016-08-22 | Discharge: 2016-08-24 | DRG: 689 | Disposition: A | Discharge status: Skilled Nursing Facility | Payer: Medicare HMO | Attending: Internal Medicine | Admitting: Internal Medicine

## 2016-08-22 ENCOUNTER — Emergency Department (HOSPITAL_COMMUNITY): Payer: Medicare HMO

## 2016-08-22 ENCOUNTER — Encounter (HOSPITAL_COMMUNITY): Payer: Self-pay

## 2016-08-22 DIAGNOSIS — E1122 Type 2 diabetes mellitus with diabetic chronic kidney disease: Secondary | ICD-10-CM | POA: Diagnosis present

## 2016-08-22 DIAGNOSIS — J449 Chronic obstructive pulmonary disease, unspecified: Secondary | ICD-10-CM | POA: Diagnosis present

## 2016-08-22 DIAGNOSIS — E118 Type 2 diabetes mellitus with unspecified complications: Secondary | ICD-10-CM | POA: Diagnosis present

## 2016-08-22 DIAGNOSIS — R531 Weakness: Secondary | ICD-10-CM | POA: Diagnosis not present

## 2016-08-22 DIAGNOSIS — Z681 Body mass index (BMI) 19 or less, adult: Secondary | ICD-10-CM | POA: Diagnosis not present

## 2016-08-22 DIAGNOSIS — I25811 Atherosclerosis of native coronary artery of transplanted heart without angina pectoris: Secondary | ICD-10-CM | POA: Diagnosis present

## 2016-08-22 DIAGNOSIS — Z9115 Patient's noncompliance with renal dialysis: Secondary | ICD-10-CM | POA: Diagnosis not present

## 2016-08-22 DIAGNOSIS — I160 Hypertensive urgency: Secondary | ICD-10-CM | POA: Diagnosis present

## 2016-08-22 DIAGNOSIS — R627 Adult failure to thrive: Secondary | ICD-10-CM | POA: Diagnosis present

## 2016-08-22 DIAGNOSIS — Z992 Dependence on renal dialysis: Secondary | ICD-10-CM | POA: Diagnosis not present

## 2016-08-22 DIAGNOSIS — N390 Urinary tract infection, site not specified: Principal | ICD-10-CM | POA: Diagnosis present

## 2016-08-22 DIAGNOSIS — E039 Hypothyroidism, unspecified: Secondary | ICD-10-CM | POA: Diagnosis present

## 2016-08-22 DIAGNOSIS — D631 Anemia in chronic kidney disease: Secondary | ICD-10-CM | POA: Diagnosis present

## 2016-08-22 DIAGNOSIS — N186 End stage renal disease: Secondary | ICD-10-CM | POA: Diagnosis not present

## 2016-08-22 DIAGNOSIS — E8889 Other specified metabolic disorders: Secondary | ICD-10-CM | POA: Diagnosis present

## 2016-08-22 DIAGNOSIS — Z79899 Other long term (current) drug therapy: Secondary | ICD-10-CM

## 2016-08-22 DIAGNOSIS — D638 Anemia in other chronic diseases classified elsewhere: Secondary | ICD-10-CM | POA: Diagnosis not present

## 2016-08-22 DIAGNOSIS — R296 Repeated falls: Secondary | ICD-10-CM | POA: Diagnosis present

## 2016-08-22 DIAGNOSIS — F0280 Dementia in other diseases classified elsewhere without behavioral disturbance: Secondary | ICD-10-CM | POA: Diagnosis not present

## 2016-08-22 DIAGNOSIS — N2581 Secondary hyperparathyroidism of renal origin: Secondary | ICD-10-CM | POA: Diagnosis present

## 2016-08-22 DIAGNOSIS — F329 Major depressive disorder, single episode, unspecified: Secondary | ICD-10-CM | POA: Diagnosis present

## 2016-08-22 DIAGNOSIS — S82002D Unspecified fracture of left patella, subsequent encounter for closed fracture with routine healing: Secondary | ICD-10-CM | POA: Diagnosis not present

## 2016-08-22 DIAGNOSIS — I951 Orthostatic hypotension: Secondary | ICD-10-CM | POA: Diagnosis present

## 2016-08-22 DIAGNOSIS — E7800 Pure hypercholesterolemia, unspecified: Secondary | ICD-10-CM

## 2016-08-22 DIAGNOSIS — B958 Unspecified staphylococcus as the cause of diseases classified elsewhere: Secondary | ICD-10-CM | POA: Diagnosis present

## 2016-08-22 DIAGNOSIS — I10 Essential (primary) hypertension: Secondary | ICD-10-CM | POA: Diagnosis present

## 2016-08-22 DIAGNOSIS — Z888 Allergy status to other drugs, medicaments and biological substances status: Secondary | ICD-10-CM

## 2016-08-22 DIAGNOSIS — E785 Hyperlipidemia, unspecified: Secondary | ICD-10-CM | POA: Diagnosis present

## 2016-08-22 DIAGNOSIS — Z8673 Personal history of transient ischemic attack (TIA), and cerebral infarction without residual deficits: Secondary | ICD-10-CM

## 2016-08-22 DIAGNOSIS — I120 Hypertensive chronic kidney disease with stage 5 chronic kidney disease or end stage renal disease: Secondary | ICD-10-CM | POA: Diagnosis present

## 2016-08-22 DIAGNOSIS — Z8249 Family history of ischemic heart disease and other diseases of the circulatory system: Secondary | ICD-10-CM

## 2016-08-22 DIAGNOSIS — E1121 Type 2 diabetes mellitus with diabetic nephropathy: Secondary | ICD-10-CM

## 2016-08-22 DIAGNOSIS — E038 Other specified hypothyroidism: Secondary | ICD-10-CM | POA: Diagnosis not present

## 2016-08-22 DIAGNOSIS — Z7982 Long term (current) use of aspirin: Secondary | ICD-10-CM

## 2016-08-22 DIAGNOSIS — Z941 Heart transplant status: Secondary | ICD-10-CM

## 2016-08-22 DIAGNOSIS — Z1639 Resistance to other specified antimicrobial drug: Secondary | ICD-10-CM | POA: Diagnosis present

## 2016-08-22 LAB — COMPREHENSIVE METABOLIC PANEL
ALBUMIN: 3.3 g/dL — AB (ref 3.5–5.0)
ALK PHOS: 93 U/L (ref 38–126)
ALT: 16 U/L — AB (ref 17–63)
AST: 20 U/L (ref 15–41)
Anion gap: 20 — ABNORMAL HIGH (ref 5–15)
BUN: 49 mg/dL — ABNORMAL HIGH (ref 6–20)
CALCIUM: 8.5 mg/dL — AB (ref 8.9–10.3)
CHLORIDE: 94 mmol/L — AB (ref 101–111)
CO2: 24 mmol/L (ref 22–32)
CREATININE: 7.26 mg/dL — AB (ref 0.61–1.24)
GFR calc non Af Amer: 7 mL/min — ABNORMAL LOW (ref >60)
GFR, EST AFRICAN AMERICAN: 8 mL/min — AB (ref >60)
GLUCOSE: 115 mg/dL — AB (ref 65–99)
Potassium: 4.4 mmol/L (ref 3.5–5.1)
SODIUM: 138 mmol/L (ref 135–145)
Total Bilirubin: 0.7 mg/dL (ref 0.3–1.2)
Total Protein: 5.7 g/dL — ABNORMAL LOW (ref 6.5–8.1)

## 2016-08-22 LAB — I-STAT TROPONIN, ED: TROPONIN I, POC: 0.01 ng/mL (ref 0.00–0.08)

## 2016-08-22 LAB — URINALYSIS, ROUTINE W REFLEX MICROSCOPIC
BILIRUBIN URINE: NEGATIVE
GLUCOSE, UA: NEGATIVE mg/dL
KETONES UR: NEGATIVE mg/dL
Nitrite: NEGATIVE
PH: 7.5 (ref 5.0–8.0)
Protein, ur: 100 mg/dL — AB
Specific Gravity, Urine: 1.019 (ref 1.005–1.030)

## 2016-08-22 LAB — RENAL FUNCTION PANEL
Albumin: 3.1 g/dL — ABNORMAL LOW (ref 3.5–5.0)
Anion gap: 18 — ABNORMAL HIGH (ref 5–15)
BUN: 54 mg/dL — ABNORMAL HIGH (ref 6–20)
CO2: 24 mmol/L (ref 22–32)
Calcium: 8.4 mg/dL — ABNORMAL LOW (ref 8.9–10.3)
Chloride: 95 mmol/L — ABNORMAL LOW (ref 101–111)
Creatinine, Ser: 7.51 mg/dL — ABNORMAL HIGH (ref 0.61–1.24)
GFR calc Af Amer: 7 mL/min — ABNORMAL LOW (ref >60)
GFR calc non Af Amer: 6 mL/min — ABNORMAL LOW (ref >60)
Glucose, Bld: 121 mg/dL — ABNORMAL HIGH (ref 65–99)
Phosphorus: 6.1 mg/dL — ABNORMAL HIGH (ref 2.5–4.6)
Potassium: 4.4 mmol/L (ref 3.5–5.1)
Sodium: 137 mmol/L (ref 135–145)

## 2016-08-22 LAB — CBC
HCT: 35.9 % — ABNORMAL LOW (ref 39.0–52.0)
Hemoglobin: 11 g/dL — ABNORMAL LOW (ref 13.0–17.0)
MCH: 30.6 pg (ref 26.0–34.0)
MCHC: 30.6 g/dL (ref 30.0–36.0)
MCV: 99.7 fL (ref 78.0–100.0)
Platelets: 258 10*3/uL (ref 150–400)
RBC: 3.6 MIL/uL — ABNORMAL LOW (ref 4.22–5.81)
RDW: 16.5 % — ABNORMAL HIGH (ref 11.5–15.5)
WBC: 7.5 10*3/uL (ref 4.0–10.5)

## 2016-08-22 LAB — SEDIMENTATION RATE: Sed Rate: 17 mm/hr — ABNORMAL HIGH (ref 0–16)

## 2016-08-22 LAB — CBC WITH DIFFERENTIAL/PLATELET
BASOS ABS: 0 10*3/uL (ref 0.0–0.1)
BASOS PCT: 1 %
EOS ABS: 0.2 10*3/uL (ref 0.0–0.7)
EOS PCT: 3 %
HCT: 37.2 % — ABNORMAL LOW (ref 39.0–52.0)
HEMOGLOBIN: 11.3 g/dL — AB (ref 13.0–17.0)
LYMPHS ABS: 1.9 10*3/uL (ref 0.7–4.0)
Lymphocytes Relative: 33 %
MCH: 30.6 pg (ref 26.0–34.0)
MCHC: 30.4 g/dL (ref 30.0–36.0)
MCV: 100.8 fL — ABNORMAL HIGH (ref 78.0–100.0)
Monocytes Absolute: 0.6 10*3/uL (ref 0.1–1.0)
Monocytes Relative: 10 %
NEUTROS PCT: 53 %
Neutro Abs: 3.2 10*3/uL (ref 1.7–7.7)
PLATELETS: 223 10*3/uL (ref 150–400)
RBC: 3.69 MIL/uL — AB (ref 4.22–5.81)
RDW: 16.9 % — ABNORMAL HIGH (ref 11.5–15.5)
WBC: 5.9 10*3/uL (ref 4.0–10.5)

## 2016-08-22 LAB — LACTIC ACID, PLASMA: Lactic Acid, Venous: 1.1 mmol/L (ref 0.5–1.9)

## 2016-08-22 LAB — CK: Total CK: 37 U/L — ABNORMAL LOW (ref 49–397)

## 2016-08-22 LAB — URINE MICROSCOPIC-ADD ON

## 2016-08-22 LAB — GLUCOSE, CAPILLARY: Glucose-Capillary: 139 mg/dL — ABNORMAL HIGH (ref 65–99)

## 2016-08-22 MED ORDER — MYCOPHENOLATE MOFETIL 250 MG PO CAPS
500.0000 mg | ORAL_CAPSULE | Freq: Every day | ORAL | Status: DC
  Administered 2016-08-23 – 2016-08-24 (×2): 500 mg via ORAL
  Filled 2016-08-22 (×2): qty 2

## 2016-08-22 MED ORDER — PANTOPRAZOLE SODIUM 40 MG PO TBEC
40.0000 mg | DELAYED_RELEASE_TABLET | Freq: Every day | ORAL | Status: DC
  Administered 2016-08-22: 40 mg via ORAL

## 2016-08-22 MED ORDER — HEPARIN SODIUM (PORCINE) 5000 UNIT/ML IJ SOLN
5000.0000 [IU] | Freq: Three times a day (TID) | INTRAMUSCULAR | Status: DC
  Administered 2016-08-22 – 2016-08-24 (×4): 5000 [IU] via SUBCUTANEOUS
  Filled 2016-08-22 (×4): qty 1

## 2016-08-22 MED ORDER — LIDOCAINE HCL (PF) 1 % IJ SOLN: 5.0000 mL | INTRAMUSCULAR | Status: DC | PRN

## 2016-08-22 MED ORDER — VITAMIN B-12 1000 MCG PO TABS
1000.0000 ug | ORAL_TABLET | Freq: Every day | ORAL | Status: DC
  Administered 2016-08-23 – 2016-08-24 (×2): 1000 ug via ORAL
  Filled 2016-08-22 (×2): qty 1

## 2016-08-22 MED ORDER — LANSOPRAZOLE 30 MG PO CPDR
30.0000 mg | DELAYED_RELEASE_CAPSULE | ORAL | Status: DC
  Filled 2016-08-22: qty 1

## 2016-08-22 MED ORDER — RENA-VITE PO TABS
1.0000 | ORAL_TABLET | Freq: Every day | ORAL | Status: DC
  Administered 2016-08-22 – 2016-08-24 (×3): 1 via ORAL
  Filled 2016-08-22 (×3): qty 1

## 2016-08-22 MED ORDER — FLUOXETINE HCL 20 MG PO CAPS
20.0000 mg | ORAL_CAPSULE | Freq: Every day | ORAL | Status: DC
  Administered 2016-08-22 – 2016-08-24 (×3): 20 mg via ORAL
  Filled 2016-08-22 (×3): qty 1

## 2016-08-22 MED ORDER — PENTAFLUOROPROP-TETRAFLUOROETH EX AERO: 1.0000 "application " | INHALATION_SPRAY | CUTANEOUS | Status: DC | PRN

## 2016-08-22 MED ORDER — SODIUM CHLORIDE 0.9 % IV SOLN: 100.0000 mL | INTRAVENOUS | Status: DC | PRN

## 2016-08-22 MED ORDER — CLINDAMYCIN PHOSPHATE 600 MG/50ML IV SOLN
600.0000 mg | Freq: Three times a day (TID) | INTRAVENOUS | Status: DC
  Administered 2016-08-22 – 2016-08-23 (×4): 600 mg via INTRAVENOUS
  Filled 2016-08-22 (×4): qty 50

## 2016-08-22 MED ORDER — FLUTICASONE PROPIONATE 50 MCG/ACT NA SUSP
2.0000 | Freq: Every evening | NASAL | Status: DC | PRN
  Filled 2016-08-22: qty 16

## 2016-08-22 MED ORDER — METOPROLOL TARTRATE 25 MG PO TABS
75.0000 mg | ORAL_TABLET | ORAL | Status: DC
  Administered 2016-08-23: 75 mg via ORAL
  Filled 2016-08-22: qty 1

## 2016-08-22 MED ORDER — INSULIN ASPART 100 UNIT/ML ~~LOC~~ SOLN
0.0000 [IU] | Freq: Three times a day (TID) | SUBCUTANEOUS | Status: DC
  Administered 2016-08-23: 1 [IU] via SUBCUTANEOUS
  Administered 2016-08-23 – 2016-08-24 (×2): 2 [IU] via SUBCUTANEOUS

## 2016-08-22 MED ORDER — METOPROLOL TARTRATE 25 MG PO TABS
75.0000 mg | ORAL_TABLET | Freq: Once | ORAL | Status: AC
  Administered 2016-08-22: 75 mg via ORAL
  Filled 2016-08-22: qty 3

## 2016-08-22 MED ORDER — ASPIRIN EC 81 MG PO TBEC
81.0000 mg | DELAYED_RELEASE_TABLET | Freq: Every day | ORAL | Status: DC
  Administered 2016-08-22 – 2016-08-23 (×2): 81 mg via ORAL
  Filled 2016-08-22 (×2): qty 1

## 2016-08-22 MED ORDER — HYDRALAZINE HCL 20 MG/ML IJ SOLN
20.0000 mg | Freq: Once | INTRAMUSCULAR | Status: AC
  Administered 2016-08-22: 20 mg via INTRAVENOUS
  Filled 2016-08-22: qty 1

## 2016-08-22 MED ORDER — METOPROLOL TARTRATE 50 MG PO TABS: 75.0000 mg | ORAL_TABLET | ORAL | Status: DC

## 2016-08-22 MED ORDER — MYCOPHENOLATE MOFETIL 250 MG PO CAPS
250.0000 mg | ORAL_CAPSULE | ORAL | Status: DC
  Administered 2016-08-23: 250 mg via ORAL
  Filled 2016-08-22: qty 1

## 2016-08-22 MED ORDER — INSULIN ASPART 100 UNIT/ML ~~LOC~~ SOLN: 0.0000 [IU] | Freq: Every day | SUBCUTANEOUS | Status: DC

## 2016-08-22 MED ORDER — LIDOCAINE-PRILOCAINE 2.5-2.5 % EX CREA: 1.0000 "application " | TOPICAL_CREAM | CUTANEOUS | Status: DC | PRN

## 2016-08-22 MED ORDER — DONEPEZIL HCL 10 MG PO TABS
10.0000 mg | ORAL_TABLET | Freq: Every day | ORAL | Status: DC
  Administered 2016-08-22 – 2016-08-23 (×2): 10 mg via ORAL
  Filled 2016-08-22 (×2): qty 1

## 2016-08-22 MED ORDER — ALTEPLASE 2 MG IJ SOLR: 2.0000 mg | Freq: Once | INTRAMUSCULAR | Status: DC | PRN

## 2016-08-22 MED ORDER — CYCLOSPORINE MODIFIED (NEORAL) 25 MG PO CAPS
50.0000 mg | ORAL_CAPSULE | Freq: Two times a day (BID) | ORAL | Status: DC
  Administered 2016-08-22 – 2016-08-24 (×4): 50 mg via ORAL
  Filled 2016-08-22 (×5): qty 2

## 2016-08-22 MED ORDER — PRAVASTATIN SODIUM 20 MG PO TABS
10.0000 mg | ORAL_TABLET | Freq: Every day | ORAL | Status: DC
  Administered 2016-08-22 – 2016-08-23 (×2): 10 mg via ORAL
  Filled 2016-08-22 (×2): qty 1

## 2016-08-22 MED ORDER — HEPARIN SODIUM (PORCINE) 1000 UNIT/ML DIALYSIS: 1000.0000 [IU] | INTRAMUSCULAR | Status: DC | PRN

## 2016-08-22 MED ORDER — ACETAMINOPHEN 325 MG PO TABS
650.0000 mg | ORAL_TABLET | Freq: Two times a day (BID) | ORAL | Status: DC
  Administered 2016-08-22 – 2016-08-23 (×2): 650 mg via ORAL
  Filled 2016-08-22 (×2): qty 2

## 2016-08-22 MED ORDER — MYCOPHENOLATE MOFETIL 250 MG PO CAPS
500.0000 mg | ORAL_CAPSULE | ORAL | Status: DC
  Administered 2016-08-22: 500 mg via ORAL
  Filled 2016-08-22: qty 2

## 2016-08-22 MED ORDER — LEVOTHYROXINE SODIUM 75 MCG PO TABS
75.0000 ug | ORAL_TABLET | Freq: Every day | ORAL | Status: DC
  Administered 2016-08-23 – 2016-08-24 (×2): 75 ug via ORAL
  Filled 2016-08-22: qty 1
  Filled 2016-08-22: qty 3

## 2016-08-22 MED ORDER — VITAMIN D 1000 UNITS PO TABS
1000.0000 [IU] | ORAL_TABLET | Freq: Every day | ORAL | Status: DC
  Administered 2016-08-22 – 2016-08-24 (×3): 1000 [IU] via ORAL
  Filled 2016-08-22 (×3): qty 1

## 2016-08-22 NOTE — ED Notes (Signed)
Patient undressed, in gown, on monitor, continuous pulse oximetry and blood pressure cuff; warm blanket given 

## 2016-08-22 NOTE — ED Notes (Signed)
Patient placed on a bedpan and urinal handed to him; wife at bedside

## 2016-08-22 NOTE — ED Notes (Signed)
Cathy RN at [facility] notified on pt.'s blood pressure .

## 2016-08-22 NOTE — ED Triage Notes (Signed)
Per [facility]: Pt is coming from home, they usually bring him by car for dialysis. Pts last dialysis session was "about 1 week ago". Pt has been too weak to be brought by car. Pts family was "too scared to handle him so they called for help". Pt is conscious and alert and oriented. Pt was able to transfer from his bed to his wheelchair with a two assist, and then from his wheelchair to the EMS stretcher with a two assist. The family called the pts doctor and they recommended that the pt come to the emergency.

## 2016-08-22 NOTE — ED Notes (Signed)
Pt given ginger ale and [place] sandwich per Dr. Maryan Rued

## 2016-08-22 NOTE — H&P (Signed)
History and Physical    [name] [name] [id number] DOB: 1941-09-26 DOA: 08/22/2016   PCP: Junie Panning, NP   Patient coming from/Resides with: Private residence/lives with wife  Admission status: Inpatient/renal floor --medically necessary to stay a minimum 2 midnights to rule out impending and/or unexpected changes in physiologic status that may differ from initial evaluation performed in the ER and/or at time of admission.   Chief Complaint: Generalized weakness and inability to attend dialysis sessions  HPI: [name] [name] is a 75 y.o. 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 Patient was last discharged on 9/20 after an admission for syncope. Last echocardiogram had been completed in June of this year with a normal EF and no wall motion abnormalities. Patient did have a UTI with greater than 100,000 colonies of coag negative staph resistant to Cipro and oxacillin. During the hospitalization he had been treated with Rocephin and discharged home on Ceftin. According to the patient and his wife, he has not done well since discharge. He remains quite weak and has been unable to ambulate with a walker without 2+ assist. He has missed dialysis this past Thursday in would've missed this past Saturday except his home health nurse was able to physically get the patient out to the [facility] bus. He returns to the ER today with reports of generalized weakness. He denies shortness of breath, chest pain, abdominal pain, nausea, vomiting, fevers, chills or cough.  ED Course:  Vital Signs: BP (!) 231/85   Pulse 68   Temp 98.3 F (36.8 C) (Oral)   Resp 16   Ht 6\' 2"  (1.88 m)   Wt 65.8 kg (145 lb)   SpO2 100%   BMI 18.62 kg/m  Two-view chest x-ray: No acute cardiopulmonary abnormalities Lab data: Sodium 138,  potassium 4.4, chloride 94, BUN 49, creatinine 7.26, glucose 115, anion gap 20, albumin 3.3, total protein 5.7, CK 37, poc troponin 0.01, WBCs 5900 differential thin normal limits, hemoglobin 11.3, platelets 223,000 Medications and treatments: None  Review of Systems:  In addition to the HPI above,  No Fever-chills, myalgias or other constitutional symptoms No Headache, changes with Vision or hearing, new focal weakness, tingling, numbness in any extremity, dizziness, dysarthria or word finding difficulty, gait disturbance or imbalance, tremors or seizure activity No problems swallowing food or Liquids, indigestion/reflux, choking or coughing while eating, abdominal pain with or after eating No Chest pain, Cough or Shortness of Breath, palpitations, orthopnea or DOE No Abdominal pain, N/V, melena,hematochezia, dark tarry stools, constipation No dysuria, malodorous urine, hematuria or flank pain No new skin rashes, lesions, masses or bruises, No new joint pains, aches, swelling or redness No recent unintentional weight gain or loss No polyuria, polydypsia or polyphagia   Past Medical History:  Diagnosis Date  . Anemia   . Cancer ([place])    SKIN  . CKD III-IV    a. since transplant in 2001 with significant worsening since 06/2013.  Marland Kitchen Coronary artery disease   . Dementia    mild  . Diabetes mellitus without complication (HCC)    FASTING CBG 90S  . Headache(784.0)   . Heart transplanted ([place])    a. 1995 CABG x 4 in [place], [place];  b. 2001 Developed CHF;  c. 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 10/2000 Cardiac transplant @ [facility], [place];  e. Historically nl Bx and stress testing.  Last stress test ~  5 yrs ago.  Marland Kitchen Hernia of abdominal wall   . History of blood transfusion   . History of stroke   . Hypercalcemia    a. 06/2013 Hospitalized in [place].  Marland Kitchen Hyperlipidemia   . Hypertension    a. Long h/o HTN, came off of all meds following transplant/wt loss-->meds resumed 06/2013, difficult to  control since.  . Myocardial infarction    PRIOR TO HEART TRANSPLANT  . Orthostatic dizziness   . Orthostatic hypotension    a. Has tried support hose and compression stockings - prefers not to wear.  . Shortness of breath    EXERTION  . Stroke ([place]) 06/2013   NO RESIDUAL - WAS CONFUSED AT TIME - THAT HAS RESOLVED    Past Surgical History:  Procedure Laterality Date  . BASCILIC VEIN TRANSPOSITION Left 07/06/2014   Procedure: [place];  Surgeon: Rosetta Posner, MD;  Location: [facility];  Service: Vascular;  Laterality: Left;  . CARDIAC SURGERY    . CATARACT EXTRACTION    . CORONARY ARTERY BYPASS GRAFT    . 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 EYE SURGERY Bilateral    CATARACTS  . HEART TRANSPLANT    . SKIN GRAFT FULL THICKNESS LEG     cat scratch that did not heal  . VASECTOMY      Social History   Social History  . Marital status: Married    Spouse name: N/A  . Number of children: 1  . Years of education: N/A   Occupational History  . Education officer, community    Social History Main Topics  . Smoking status: Never Smoker  . Smokeless tobacco: Never Used  . Alcohol use No  . Drug use: No  . Sexual activity: Not on file   Other Topics Concern  . Not on file   Social History Narrative   Patient is from [place].  Moved to [place] in late '90's.  Moved to [place] 06/20/2014 to live closer to son.  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  Plans to continue to work now that he is in [place].    Mobility: Rolling walker but since previous discharge has been unable to navigate this without significant assistance Work history: Disabled   Allergies  Allergen Reactions  . Norvasc [Amlodipine Besylate] Swelling and Other (See Comments)    Swelling of the lower body   . Ace Inhibitors Other (See  Comments)    On dialysis - cannot take these meds  . Angiotensin Receptor Blockers Other (See Comments)    On dialysis - cannot take these meds    Family History  Problem Relation Age of Onset  . Heart attack Father     died @ 60  . Diabetes Father   . Heart attack Mother   . Diabetes Mother   . Kidney disease Maternal Aunt     Prior to Admission medications   Medication Sig Start Date End Date Taking? Authorizing Provider  acetaminophen (TYLENOL) 325 MG tablet Take 2 tablets (650 mg total) by mouth every 6 (six) hours as needed for mild pain (or Fever >/= 101). Patient taking differently: Take 650 mg by mouth 2 (two) times daily.  10/14/14  Yes Delfina Redwood, MD  aspirin EC 81 MG tablet Take 81 mg by mouth at bedtime.  Yes Historical Provider, MD  cholecalciferol (VITAMIN D) 1000 UNITS tablet Take 1,000 Units by mouth daily.   Yes Historical Provider, MD  cycloSPORINE modified (NEORAL) 25 MG capsule Take 50 mg by mouth 2 (two) times daily.  08/18/14  Yes Lelon Perla, MD  diphenoxylate-atropine (LOMOTIL) 2.5-0.025 MG per tablet Take 1 tablet by mouth 2 (two) times daily as needed for diarrhea or loose stools.  03/01/15  Yes Historical Provider, MD  donepezil (ARICEPT) 10 MG tablet Take 10 mg by mouth at bedtime.  03/01/15  Yes Historical Provider, MD  FLUoxetine (PROZAC) 20 MG capsule Take 20 mg by mouth daily.    Yes Historical Provider, MD  lansoprazole (PREVACID) 30 MG capsule Take 30 mg by mouth See admin instructions. Take 1 capsule (30 mg) by mouth twice daily on Sunday, Monday, Wednesday, Friday (non-dialysis days) 12/22/15  Yes Historical Provider, MD  levothyroxine (SYNTHROID, LEVOTHROID) 75 MCG tablet Take 75 mcg by mouth daily.    Yes Historical Provider, MD  metoprolol tartrate (LOPRESSOR) 25 MG tablet Take 2 tablets (50 mg total) by mouth 2 (two) times daily. Patient taking differently: Take 75 mg by mouth See admin instructions. Take 75 mg by mouth daily on Sunday,  Monday, Wednesday and Friday. Do not take on dialysis days. 12/27/14  Yes Ripudeep Krystal Eaton, MD  multivitamin (RENA-VIT) TABS tablet Take 1 tablet by mouth daily.  03/08/15  Yes Historical Provider, MD  mycophenolate (CELLCEPT) 250 MG capsule Take 250-500 mg by mouth See admin instructions. Take 500 mg by mouth in the morning and take 250 mg by mouth in the evening. On Tuesday, Thursday and Saturday only takes 500 mg by mouth in the evening.   Yes Historical Provider, MD  pravastatin (PRAVACHOL) 10 MG tablet Take 10 mg by mouth at bedtime.    Yes Historical Provider, MD  Probiotic Product (PRO-BIOTIC BLEND) CAPS Take 1 capsule by mouth daily.   Yes Historical Provider, MD  vitamin B-12 (CYANOCOBALAMIN) 1000 MCG tablet Take 1,000 mcg by mouth daily with breakfast.   Yes Historical Provider, MD  fluticasone (FLONASE) 50 MCG/ACT nasal spray Place 2 sprays into both nostrils at bedtime as needed for allergies.  04/12/16   Historical Provider, MD  oxyCODONE-acetaminophen (PERCOCET) 5-325 MG tablet Take 1 tablet by mouth every 4 (four) hours as needed. Patient not taking: Reported on 08/22/2016 06/27/16   Nat Christen, MD    Physical Exam: Vitals:   08/22/16 1545 08/22/16 1548 08/22/16 1615 08/22/16 1630  BP: (!) 225/96 (!) 225/96 (!) 227/92 (!) 231/85  Pulse: 67 65 71 68  Resp:  16 19 16   Temp:      TempSrc:      SpO2: 100% 100% 100% 100%  Weight:      Height:          Constitutional: NAD, calm, comfortable Eyes: PERRL, lids and conjunctivae normal ENMT: Mucous membranes are moist. Posterior pharynx clear of any exudate or lesions.Normal dentition.  Neck: normal, supple, no masses, no thyromegaly Respiratory: clear to auscultation bilaterally, no wheezing, no crackles. Normal respiratory effort. No accessory muscle use.  Cardiovascular: Regular rate and rhythm, no murmurs / rubs / gallops. No extremity edema. 2+ pedal pulses. No carotid bruits. Left upper extremity dialysis AVF unremarkable Abdomen:  no tenderness, no masses palpated. No hepatosplenomegaly. Bowel sounds positive.  Musculoskeletal: no clubbing / cyanosis. No joint deformity upper and lower extremities. Good ROM, no contractures. Normal muscle tone.  Skin: no rashes, lesions, ulcers. No induration Neurologic:  CN 2-12 grossly intact. Sensation intact, DTR normal. Strength 4/5 x all 4 extremities.  Psychiatric: Normal judgment and insight. Alert and oriented x 3. Normal mood.    Labs on Admission: I have personally reviewed following labs and imaging studies  CBC:  Recent Labs Lab 08/22/16 1320  WBC 5.9  NEUTROABS 3.2  HGB 11.3*  HCT 37.2*  MCV 100.8*  PLT [id number]   Basic Metabolic Panel:  Recent Labs Lab 08/22/16 1320  NA 138  K 4.4  CL 94*  CO2 24  GLUCOSE 115*  BUN 49*  CREATININE 7.26*  CALCIUM 8.5*   GFR: Estimated Creatinine Clearance: 8.2 mL/min (by C-G formula based on SCr of 7.26 mg/dL (H)). Liver Function Tests:  Recent Labs Lab 08/22/16 1320  AST 20  ALT 16*  ALKPHOS 93  BILITOT 0.7  PROT 5.7*  ALBUMIN 3.3*   No results for input(s): LIPASE, AMYLASE in the last 168 hours. No results for input(s): AMMONIA in the last 168 hours. Coagulation Profile: No results for input(s): INR, PROTIME in the last 168 hours. Cardiac Enzymes:  Recent Labs Lab 08/22/16 1320  CKTOTAL 37*   BNP (last 3 results) No results for input(s): PROBNP in the last 8760 hours. HbA1C: No results for input(s): HGBA1C in the last 72 hours. CBG: No results for input(s): GLUCAP in the last 168 hours. Lipid Profile: No results for input(s): CHOL, HDL, LDLCALC, TRIG, CHOLHDL, LDLDIRECT in the last 72 hours. Thyroid Function Tests: No results for input(s): TSH, T4TOTAL, FREET4, T3FREE, THYROIDAB in the last 72 hours. Anemia Panel: No results for input(s): VITAMINB12, FOLATE, FERRITIN, TIBC, IRON, RETICCTPCT in the last 72 hours. Urine analysis:    Component Value Date/Time   COLORURINE YELLOW 08/08/2016 0701     APPEARANCEUR CLOUDY (A) 08/08/2016 0701   LABSPEC 1.014 08/08/2016 0701   PHURINE 8.5 (H) 08/08/2016 0701   GLUCOSEU NEGATIVE 08/08/2016 0701   HGBUR SMALL (A) 08/08/2016 0701   BILIRUBINUR NEGATIVE 08/08/2016 0701   KETONESUR NEGATIVE 08/08/2016 0701   PROTEINUR 100 (A) 08/08/2016 0701   UROBILINOGEN 1.0 04/22/2015 1952   NITRITE NEGATIVE 08/08/2016 0701   LEUKOCYTESUR LARGE (A) 08/08/2016 0701   Sepsis Labs: @LABRCNTIP (procalcitonin:4,lacticidven:4) )No results found for this or any previous visit (from the past 240 hour(s)).   Radiological Exams on Admission: Dg Chest 2 View  Result Date: 08/22/2016 CLINICAL DATA:  Weakness and vomiting for 2 weeks EXAM: CHEST  2 VIEW COMPARISON:  08/07/2016 FINDINGS: Previous median sternotomy and CABG procedure. Normal heart size. No pleural effusion or edema. Good residual changes of COPD identified. Chronic deformity involving the left humeral head noted. IMPRESSION: 1. No acute cardiopulmonary abnormalities. Electronically Signed   By: Kerby Moors M.D.   On: 08/22/2016 13:55    EKG: (Independently reviewed) Sinus rhythm with ventricular rate 65 bpm, QTC 498 ms, incomplete right bundle branch block, nonspecific ST changes inferior lateral leads unchanged since previous EKG  Assessment/Plan Principal Problem:   Generalized weakness -Patient presents with symptoms of generalized weakness consistent with profound physical deconditioning after recent admission for syncope and UTI -No symptoms concerning for cardiac etiology -May be reflective of atypical presentation of infection and patient who is chronically immunosuppressed with medications -PT/OT evaluation -Social work evaluation for possible skilled nursing facility placement after medically stable -Orthostatic vital signs  Active Problems:   ??UTI (urinary tract infection) -During previous admission patient was found to have coag-negative staph UTI resistant to Cipro and  oxacillin -Treated with Rocephin and Ceftin and given  resistance pattern patient may have been incompletely treated -Chek catheterization urinalysis with culture as well as blood cultures -After cultures obtained will need to begin clindamycin IV (was sensitive to this on previous culture with a 0.25 mic) -Check lactic acid although may be elevated since patient presents in need of dialysis and anion gap and serum CO2 are elevated    Uncontrolled hypertension -Reflective of patient need for dialysis -At this juncture I'm not going to give typical antihypertensive medications since patient will have a decrease in blood pressure historically with dialysis but have ordered these medications to resume after dialysis -Home meds: Lopressor    Heart transplanted  -Continue preadmission cyclosporine and CellCept -If has bacteremia may need to reevaluate use of immunosuppressants in the acute setting and may require ID consultation    ESRD on dialysis  -Appreciate nephrology consultation -Anticipate will undergo dialysis today -Typically dialyzes on TThSa    Controlled diabetes mellitus type 2 with complications  -Diet controlled -Follow CBGs -Hemoglobin A1c in June was 5.5    Hypercholesterolemia -Continue preadmission Pravachol    Hypothyroidism -Continue preadmission Synthroid -TSH in June was 1.405    Anemia of chronic disease -Baseline hemoglobin around 9.5-10.2 -Current hemoglobin 11.3 -Follow after dialysis -May need dry weight adjusted-wife reports poor oral intake and hemoglobin slightly concentrated potentially reflective of volume depletion    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 ANP-BC [facility] Pager [phone]   If 7PM-7AM, please contact night-coverage [email or web address] Password [email or web address]  08/22/2016, 4:54 PM

## 2016-08-22 NOTE — ED Notes (Signed)
Patient transported to X-ray 

## 2016-08-22 NOTE — ED Notes (Signed)
Pts wife states that when the pt had to go to dialysis on Saturday he had to be helped into his wheelchair by "the aid" and then taken to the [facility] bus.

## 2016-08-22 NOTE — ED Notes (Signed)
Attempting to page admitting provider.

## 2016-08-22 NOTE — ED Provider Notes (Signed)
[facility] DEPT Provider Note   CSN: [id number] Arrival date & time: 08/22/16  1249     History   Chief Complaint No chief complaint on file.   HPI [name] [name] is a 75 y.o. male.  Patient is a 75 year old male with a history of end-stage renal disease on dialysis, heart transplant, diabetes, hypertension presenting today with generalized weakness. Patient was admitted to the hospital 2 weeks ago after a syncopal event at that time with no specific cause for his syncope but after going home wife states he has been unable to ambulate in his home and cannot even sit up in bed. Patient has missed several dialysis treatments because he's been unable to make it to the bus due to generalized weakness. He did dialyze on Saturday because a home health nurse put him in a wheelchair and got him out to the bus but this morning he could not even sit upright in bed. He denies any fever, shortness of breath, chest pain, abdominal pain, nausea, vomiting, distal edema. Wife states in the past he's never been this week before. They spoke with their nephrologist Dr. Mercy Moore today who recommended patient come to the emergency room for dialysis and admission for further care.   The history is provided by the patient and the spouse.    Past Medical History:  Diagnosis Date  . Anemia   . Cancer ([place])    SKIN  . CKD III-IV    a. since transplant in 2001 with significant worsening since 06/2013.  [place] [facility] Coronary artery disease   . Dementia    mild  . Diabetes mellitus without complication (HCC)    FASTING CBG 90S  . Headache(784.0)   . Heart transplanted ([place])    a. 1995 CABG x 4 in [place], [place];  b. 2001 Developed CHF;  c. 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 10/2000 Cardiac transplant @ [facility], [place];  e. Historically nl Bx and stress testing.  Last stress test ~ 5 yrs ago.  [place] [facility] Hernia of abdominal wall   . History of blood transfusion   . History of stroke   . Hypercalcemia    a. 06/2013  Hospitalized in [place].  [place] Hyperlipidemia   . Hypertension    a. Long h/o HTN, came off of all meds following transplant/wt loss-->meds resumed 06/2013, difficult to control since.  . Myocardial infarction    PRIOR TO HEART TRANSPLANT  . Orthostatic dizziness   . Orthostatic hypotension    a. Has tried support hose and compression stockings - prefers not to wear.  . Shortness of breath    EXERTION  . Stroke ([place]) 06/2013   NO RESIDUAL - WAS CONFUSED AT TIME - THAT HAS RESOLVED    Patient Active Problem List   Diagnosis Date Noted  . Urinary tract infection, site not specified   . Syncope and collapse   . Syncope 08/07/2016  . Faintness   . Essential hypertension   . Acute blood loss anemia   . Cardiomyopathy ([place])   . Controlled diabetes mellitus type 2 with complications ([place])   . 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 Traumatic subdural hematoma ([place]) 05/04/2016  . 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 Subdural hematoma ([place]) 05/02/2016  . CKD (chronic kidney disease)   . Protein-calorie malnutrition, severe ([place]) 04/23/2015  . Leukocytosis 04/23/2015  . Anemia of chronic disease 04/23/2015  . Depression 04/23/2015  . UTI (lower urinary tract infection) 04/22/2015  . Barrett's  esophagus 03/22/2015  . ESRD on dialysis ([place]) 10/12/2014  . DM (diabetes mellitus), type 2 with renal complications ([place]) [id number]  . Hypothyroidism 10/06/2014  . Hypertension 10/05/2014  . Heart transplanted ([place])   . Hypercholesterolemia 06/20/2014  . 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 BASCILIC VEIN TRANSPOSITION Left 07/06/2014   Procedure: [place];  Surgeon: Rosetta Posner, MD;  Location: [facility];  Service: Vascular;  Laterality: Left;  . CARDIAC SURGERY    . CATARACT EXTRACTION    . CORONARY ARTERY BYPASS GRAFT    . CRANIOTOMY Right 05/04/2016    Procedure: Right Craniotomy for Evacuation of Subdural Hematoma;  Surgeon: Earnie Larsson, MD;  Location: [facility] NEURO ORS;  Service: Neurosurgery;  Laterality: Right;  . EYE SURGERY Bilateral    CATARACTS  . HEART TRANSPLANT    . SKIN GRAFT FULL THICKNESS LEG     cat scratch that did not heal  . VASECTOMY         Home Medications    Prior to Admission medications   Medication Sig Start Date End Date Taking? Authorizing Provider  acetaminophen (TYLENOL) 325 MG tablet Take 2 tablets (650 mg total) by mouth every 6 (six) hours as needed for mild pain (or Fever >/= 101). Patient taking differently: Take 650 mg by mouth 2 (two) times daily.  10/14/14  Yes Delfina Redwood, MD  aspirin EC 81 MG tablet Take 81 mg by mouth at bedtime.   Yes Historical Provider, MD  cholecalciferol (VITAMIN D) 1000 UNITS tablet Take 1,000 Units by mouth daily.   Yes Historical Provider, MD  cycloSPORINE modified (NEORAL) 25 MG capsule Take 50 mg by mouth 2 (two) times daily.  08/18/14  Yes Lelon Perla, MD  diphenoxylate-atropine (LOMOTIL) 2.5-0.025 MG per tablet Take 1 tablet by mouth 2 (two) times daily as needed for diarrhea or loose stools.  03/01/15  Yes Historical Provider, MD  donepezil (ARICEPT) 10 MG tablet Take 10 mg by mouth at bedtime.  03/01/15  Yes Historical Provider, MD  FLUoxetine (PROZAC) 20 MG capsule Take 20 mg by mouth daily.    Yes Historical Provider, MD  lansoprazole (PREVACID) 30 MG capsule Take 30 mg by mouth See admin instructions. Take 1 capsule (30 mg) by mouth twice daily on Sunday, Monday, Wednesday, Friday (non-dialysis days) 12/22/15  Yes Historical Provider, MD  levothyroxine (SYNTHROID, LEVOTHROID) 75 MCG tablet Take 75 mcg by mouth daily.    Yes Historical Provider, MD  metoprolol tartrate (LOPRESSOR) 25 MG tablet Take 2 tablets (50 mg total) by mouth 2 (two) times daily. Patient taking differently: Take 75 mg by mouth See admin instructions. Take 75 mg by mouth daily on Sunday,  Monday, Wednesday and Friday. Do not take on dialysis days. 12/27/14  Yes Ripudeep Krystal Eaton, MD  multivitamin (RENA-VIT) TABS tablet Take 1 tablet by mouth daily.  03/08/15  Yes Historical Provider, MD  mycophenolate (CELLCEPT) 250 MG capsule Take 250-500 mg by mouth See admin instructions. Take 500 mg by mouth in the morning and take 250 mg by mouth in the evening. On Tuesday, Thursday and Saturday only takes 500 mg by mouth in the evening.   Yes Historical Provider, MD  pravastatin (PRAVACHOL) 10 MG tablet Take 10 mg by mouth at bedtime.    Yes Historical Provider, MD  Probiotic Product (PRO-BIOTIC BLEND) CAPS Take 1 capsule by mouth daily.   Yes Historical Provider, MD  vitamin B-12 (CYANOCOBALAMIN) 1000 MCG tablet Take 1,000 mcg by mouth daily with breakfast.   Yes Historical Provider, MD  fluticasone (FLONASE) 50 MCG/ACT nasal spray Place 2 sprays into both nostrils at bedtime as needed for allergies.  04/12/16   Historical Provider, MD  oxyCODONE-acetaminophen (PERCOCET) 5-325 MG tablet Take 1 tablet by mouth every 4 (four) hours as needed. Patient not taking: Reported on 08/22/2016 06/27/16   Nat Christen, MD    Family History Family History  Problem Relation Age of Onset  . Heart attack Father     died @ 90  . Diabetes Father   . Heart attack Mother   . Diabetes Mother   . Kidney disease Maternal Aunt     Social History Social History  Substance Use Topics  . Smoking status: Never Smoker  . Smokeless tobacco: Never Used  . Alcohol use No     Allergies   Norvasc [amlodipine besylate]; Ace inhibitors; and Angiotensin receptor blockers   Review of Systems Review of Systems  All other systems reviewed and are negative.    Physical Exam Updated Vital Signs BP (!) 225/96   Pulse 65   Temp 98.3 F (36.8 C) (Oral)   Resp 16   Ht 6\' 2"  (1.88 m)   Wt 145 lb (65.8 kg)   SpO2 100%   BMI 18.62 kg/m   Physical Exam  Constitutional: He is oriented to person, place, and time. He  appears well-developed and well-nourished. No distress.  HENT:  Head: Normocephalic and atraumatic.  Mouth/Throat: Oropharynx is clear and moist.  Eyes: Conjunctivae and EOM are normal. Pupils are equal, round, and reactive to light.  Neck: Normal range of motion. Neck supple.  Cardiovascular: Normal rate, regular rhythm and intact distal pulses.   Murmur heard.  Systolic murmur is present with a grade of 3/6  Pulmonary/Chest: Effort normal and breath sounds normal. No respiratory distress. He has no wheezes. He has no rales.  Abdominal: Soft. He exhibits no distension. There is no tenderness. There is no rebound and no guarding.  Musculoskeletal: Normal range of motion. He exhibits no edema or tenderness.  Neurological: He is alert and oriented to person, place, and time. No sensory deficit.  Generalized weakness 4 out of 5 strength but no focal deficit  Skin: Skin is warm and dry. No rash noted. No erythema. There is pallor.  Psychiatric: He has a normal mood and affect. His behavior is normal.  Nursing note and vitals reviewed.    ED Treatments / Results  Labs (all labs ordered are listed, but only abnormal results are displayed) Labs Reviewed  CBC WITH DIFFERENTIAL/PLATELET - Abnormal; Notable for the following:       Result Value   RBC 3.69 (*)    Hemoglobin 11.3 (*)    HCT 37.2 (*)    MCV 100.8 (*)    RDW 16.9 (*)    All other components within normal limits  COMPREHENSIVE METABOLIC PANEL - Abnormal; Notable for the following:    Chloride 94 (*)    Glucose, Bld 115 (*)    BUN 49 (*)    Creatinine, Ser 7.26 (*)    Calcium 8.5 (*)    Total Protein 5.7 (*)    Albumin 3.3 (*)    ALT 16 (*)    GFR calc non Af Amer 7 (*)    GFR calc Af Amer 8 (*)    Anion gap 20 (*)    All other components within normal limits  CK - Abnormal; Notable for the following:    Total CK 37 (*)    All other components within normal limits  URINALYSIS, ROUTINE W REFLEX MICROSCOPIC (NOT AT  [facility])  I-STAT TROPOININ, ED    EKG  EKG Interpretation  Date/Time:  Tuesday August 22 2016 12:57:15 EDT Ventricular Rate:  65 PR Interval:    QRS Duration: 120 QT Interval:  478 QTC Calculation: 498 R Axis:   89 Text Interpretation:  Sinus rhythm IVCD, consider atypical RBBB No significant change since last tracing Confirmed by Maryan Rued  MD, Loree Fee ([id number]) on 08/22/2016 1:07:08 PM       Radiology Dg Chest 2 View  Result Date: 08/22/2016 CLINICAL DATA:  Weakness and vomiting for 2 weeks EXAM: CHEST  2 VIEW COMPARISON:  08/07/2016 FINDINGS: Previous median sternotomy and CABG procedure. Normal heart size. No pleural effusion or edema. Good residual changes of COPD identified. Chronic deformity involving the left humeral head noted. IMPRESSION: 1. No acute cardiopulmonary abnormalities. Electronically Signed   By: Kerby Moors M.D.   On: 08/22/2016 13:55    Procedures Procedures (including critical care time)  Medications Ordered in ED Medications - No data to display   Initial Impression / Assessment and Plan / ED Course  I have reviewed the triage vital signs and the nursing notes.  Pertinent labs & imaging results that were available during my care of the patient were reviewed by me and considered in my medical decision making (see chart for details).  Clinical Course   Patient is a 75 year old male with multiple medical problems presenting today with generalized weakness for the last 2 weeks since discharge from the hospital. Wife states he's to the point now where he cannot even sit upright in bed independently. He normally can walk with a walker around his home and has been unable to do that since getting out of the hospital. He is missing dialysis because he is unable to get out of bed. He did dialyze on Saturday when the home health nurse got him up and got him to the bus. They spoke with her nephrologist today who recommended they come to the emergency room to receive  dialysis and get admitted for further care. He has recently had urinary tract infections and last had an antibiotic 2 weeks ago he took 3 days of Ceftin. He is hypertensive here but has not taken his medications this morning because he does not take them on dialysis days. He's been bradycardic here between 50 and 60. He has not missed any anti-rejection medication and has no infectious symptoms at this time. In eyes any shortness of breath and does not appear to have fluid overload at this time. Echo done in June showed EF of 60-65%. Patient is not having any chest pain or shortness of breath to suggest PE.  Discussed with nephrology for dialysis and hospitalist for admission.  UA still pending.  Final Clinical Impressions(s) / ED Diagnoses   Final diagnoses:  Weakness    New Prescriptions New Prescriptions   No medications on file     Blanchie Dessert, MD 08/22/16 [phone]

## 2016-08-23 DIAGNOSIS — R531 Weakness: Secondary | ICD-10-CM

## 2016-08-23 DIAGNOSIS — N342 Other urethritis: Secondary | ICD-10-CM

## 2016-08-23 DIAGNOSIS — E038 Other specified hypothyroidism: Secondary | ICD-10-CM

## 2016-08-23 DIAGNOSIS — F0280 Dementia in other diseases classified elsewhere without behavioral disturbance: Secondary | ICD-10-CM

## 2016-08-23 DIAGNOSIS — D638 Anemia in other chronic diseases classified elsewhere: Secondary | ICD-10-CM

## 2016-08-23 LAB — RENAL FUNCTION PANEL
ALBUMIN: 2.9 g/dL — AB (ref 3.5–5.0)
Anion gap: 16 — ABNORMAL HIGH (ref 5–15)
BUN: 57 mg/dL — AB (ref 6–20)
CALCIUM: 8.3 mg/dL — AB (ref 8.9–10.3)
CO2: 26 mmol/L (ref 22–32)
Chloride: 96 mmol/L — ABNORMAL LOW (ref 101–111)
Creatinine, Ser: 7.77 mg/dL — ABNORMAL HIGH (ref 0.61–1.24)
GFR calc Af Amer: 7 mL/min — ABNORMAL LOW (ref >60)
GFR calc non Af Amer: 6 mL/min — ABNORMAL LOW (ref >60)
GLUCOSE: 88 mg/dL (ref 65–99)
PHOSPHORUS: 6.8 mg/dL — AB (ref 2.5–4.6)
Potassium: 4.1 mmol/L (ref 3.5–5.1)
SODIUM: 138 mmol/L (ref 135–145)

## 2016-08-23 LAB — GLUCOSE, CAPILLARY
Glucose-Capillary: 127 mg/dL — ABNORMAL HIGH (ref 65–99)
Glucose-Capillary: 155 mg/dL — ABNORMAL HIGH (ref 65–99)
Glucose-Capillary: 115 mg/dL — ABNORMAL HIGH (ref 65–99)

## 2016-08-23 LAB — CBC
HCT: 33.4 % — ABNORMAL LOW (ref 39.0–52.0)
HCT: 31.8 % — ABNORMAL LOW (ref 39.0–52.0)
Hemoglobin: 10.2 g/dL — ABNORMAL LOW (ref 13.0–17.0)
Hemoglobin: 9.9 g/dL — ABNORMAL LOW (ref 13.0–17.0)
MCH: 30.4 pg (ref 26.0–34.0)
MCH: 30.8 pg (ref 26.0–34.0)
MCHC: 30.5 g/dL (ref 30.0–36.0)
MCHC: 31.1 g/dL (ref 30.0–36.0)
MCV: 99.7 fL (ref 78.0–100.0)
MCV: 99.1 fL (ref 78.0–100.0)
PLATELETS: 247 10*3/uL (ref 150–400)
PLATELETS: 224 10*3/uL (ref 150–400)
RBC: 3.35 MIL/uL — ABNORMAL LOW (ref 4.22–5.81)
RBC: 3.21 MIL/uL — ABNORMAL LOW (ref 4.22–5.81)
RDW: 16.8 % — AB (ref 11.5–15.5)
RDW: 16.8 % — AB (ref 11.5–15.5)
WBC: 6.3 10*3/uL (ref 4.0–10.5)
WBC: 5.7 10*3/uL (ref 4.0–10.5)

## 2016-08-23 LAB — BASIC METABOLIC PANEL
Anion gap: 14 (ref 5–15)
BUN: 57 mg/dL — AB (ref 6–20)
CALCIUM: 8.4 mg/dL — AB (ref 8.9–10.3)
CHLORIDE: 96 mmol/L — AB (ref 101–111)
CO2: 26 mmol/L (ref 22–32)
CREATININE: 7.75 mg/dL — AB (ref 0.61–1.24)
GFR calc Af Amer: 7 mL/min — ABNORMAL LOW (ref >60)
GFR, EST NON AFRICAN AMERICAN: 6 mL/min — AB (ref >60)
Glucose, Bld: 91 mg/dL (ref 65–99)
Potassium: 4.2 mmol/L (ref 3.5–5.1)
SODIUM: 136 mmol/L (ref 135–145)

## 2016-08-23 LAB — MRSA PCR SCREENING: MRSA BY PCR: NEGATIVE

## 2016-08-23 LAB — C DIFFICILE QUICK SCREEN W PCR REFLEX
C Diff antigen: NEGATIVE
C Diff interpretation: NOT DETECTED
C Diff toxin: NEGATIVE

## 2016-08-23 MED ORDER — CALCIUM ACETATE (PHOS BINDER) 667 MG PO CAPS
667.0000 mg | ORAL_CAPSULE | Freq: Three times a day (TID) | ORAL | Status: DC
  Administered 2016-08-23 – 2016-08-24 (×4): 667 mg via ORAL
  Filled 2016-08-23 (×4): qty 1

## 2016-08-23 MED ORDER — DARBEPOETIN ALFA 100 MCG/0.5ML IJ SOSY
100.0000 ug | PREFILLED_SYRINGE | Freq: Once | INTRAMUSCULAR | Status: DC
  Filled 2016-08-23: qty 0.5

## 2016-08-23 MED ORDER — LANSOPRAZOLE 15 MG PO TBDP
30.0000 mg | ORAL_TABLET | ORAL | Status: DC
  Administered 2016-08-23: 30 mg via ORAL
  Filled 2016-08-23: qty 2

## 2016-08-23 MED ORDER — ACETAMINOPHEN 325 MG PO TABS
650.0000 mg | ORAL_TABLET | Freq: Four times a day (QID) | ORAL | Status: DC | PRN
  Administered 2016-08-23 – 2016-08-24 (×2): 650 mg via ORAL
  Filled 2016-08-23 (×2): qty 2

## 2016-08-23 MED ORDER — ACETAMINOPHEN 325 MG PO TABS
ORAL_TABLET | ORAL | Status: AC
  Administered 2016-08-23: 325 mg
  Filled 2016-08-23: qty 2

## 2016-08-23 NOTE — Progress Notes (Signed)
OT Cancellation Note  Patient Details Name: [name] [name] MRN: [id number] DOB: Nov 22, 1940   Cancelled Treatment:    Reason Eval/Treat Not Completed: Patient at procedure or test/ unavailable (HD). Will follow.  Malka So 08/23/2016, 11:25 AM  [phone]

## 2016-08-23 NOTE — Progress Notes (Signed)
PT Cancellation Note  Patient Details Name: [name] [name] MRN: [id number] DOB: 1941/04/14   Cancelled Treatment:    Reason Eval/Treat Not Completed: Medical issues which prohibited therapy   When checked within the hour, [name]'s BP was quite high, and precludes increased activity (concern for BP to go higher); noted that historically after HD his BP typically normalizes;   Will follow up later today as time allows;  Otherwise, will follow up for PT tomorrow;   Thank you,  [name], [place] Pager [phone] Office [phone]     [name] [facility] 08/23/2016, 8:24 AM

## 2016-08-23 NOTE — Progress Notes (Addendum)
PROGRESS NOTE        PATIENT DETAILS Name: [name] [name] Age: 75 y.o. Sex: male Date of Birth: Mar 14, 1941 Admit Date: 08/22/2016 Admitting Physician Waldemar Dickens, MD [id number], Andree Elk, NP  Brief Narrative: Patient is a 75 y.o. 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 See below for further details  Subjective: Seen during hemodialysis early this morning-does acknowledge some frequency/urgency of urination. But denies dysuria  Denies cough or shortness of breath.   Assessment/Plan: Generalized weakness/failure to thrive syndrome: Etiology not clear, however UA is consistent with another episode of UTI. Suspect advanced age/frailty/multiple comorbid medical conditions contributing as well. PT evaluation pending, likely will require SNF rehabilitation on discharge.  UTI: Previous urine culture positive for coag-negative Staphylococcus-currently on clindamycin. He does acknowledge some frequency/urgency-but is afebrile/without leukocytosis-however is on immunosuppressive. For now continue with clindamycin, await culture data.  History of recurrent falls/syncope: Secondary to orthostatic hypotension/chronic autonomic dysfunction. Unfortunately had significantly elevated blood pressure on admission-difficult situation-will need to allow some permissive hypertension given history of orthostatic/autonomic dysfunction to allow for some room for the blood pressure to drop. For now cautiously continue with metoprolol and we will follow and adjust accordingly  Hypertensive urgency: Suspect blood pressure will continue to improve post dialysis-see above regarding concern for orthostatic/autonomic  dysfunction.  History of cardiac transplantation: Continue cyclosporine and CellCept. Followed at [facility].  ESRD: On HD TTS-nephrology consulted.  Type 2 diabetes: Diet controlled-CBGs stable with SSI.  Anemia: Secondary to chronic kidney disease, darbepoetin being dosed by nephrology.  Hypothyroidism: Continue Synthroid  Depression: Continue Prozac  Recent left patella  fracture: Patient is a good few weeks back-he is following with orthopedics-he has a brace brace in place in his left knee. He has been ambulating with this brace.  Dementia: Continue Aricept-was mostly awake and alert during my evaluation this morning.  DVT Prophylaxis: Prophylactic Heparin   Code Status: Full code   Family Communication: None at bedside-we will recheck her to family over the next few days  Disposition Plan: Remain inpatient-but will plan on Home health vs SNF on discharge  Antimicrobial agents: See below  Procedures: None  CONSULTS:  nephrology  Time spent: 25 minutes-Greater than 50% of this time was spent in counseling, explanation of diagnosis, planning of further management, and coordination of care.  MEDICATIONS: Anti-infectives    Start     Dose/Rate Route Frequency Ordered Stop   08/22/16 1730  clindamycin (CLEOCIN) IVPB 600 mg     600 mg 100 mL/hr over 30 Minutes Intravenous Every 8 hours 08/22/16 1720        Scheduled Meds: . acetaminophen  650 mg Oral BID  . aspirin EC  81 mg Oral QHS  . calcium acetate  667 mg Oral TID WC  . cholecalciferol  1,000 Units Oral Daily  . clindamycin (CLEOCIN) IV  600 mg Intravenous Q8H  . cycloSPORINE modified  50 mg Oral BID  . darbepoetin (ARANESP) injection - DIALYSIS  100 mcg Intravenous Once  . donepezil  10 mg Oral QHS  . FLUoxetine  20 mg Oral Daily  . heparin  5,000 Units Subcutaneous Q8H  . insulin aspart  0-5 Units Subcutaneous QHS  . insulin aspart  0-9 Units Subcutaneous TID WC  . lansoprazole  30 mg Oral  Once per day on Sun Mon Wed Fri  . levothyroxine  75 mcg Oral QAC breakfast  . metoprolol tartrate  75 mg Oral Once per day on Sun Mon Wed Fri  . multivitamin  1 tablet Oral Daily  . mycophenolate  250 mg Oral Once per day on Sun Mon Wed Fri  . mycophenolate  500 mg Oral Daily  . mycophenolate  500 mg Oral Once per day on Tue Thu Sat  . pravastatin  10 mg Oral QHS  . vitamin B-12  1,000 mcg Oral Q breakfast   Continuous Infusions:  PRN Meds:.fluticasone   PHYSICAL EXAM: Vital signs: Vitals:   08/23/16 1000 08/23/16 1030 08/23/16 1059 08/23/16 1210  BP: (!) 170/84 (!) 163/77 (!) 181/84 (!) 168/66  Pulse: 69 71 65 74  Resp:   15 16  Temp:   97.8 F (36.6 C) 97.9 F (36.6 C)  TempSrc:   Oral Oral  SpO2:    100%  Weight:   64.9 kg (143 lb 1.3 oz)   Height:       Filed Weights   08/22/16 1945 08/23/16 0650 08/23/16 1059  Weight: 66.6 kg (146 lb 13.2 oz) 65.9 kg (145 lb 4.5 oz) 64.9 kg (143 lb 1.3 oz)   Body mass index is 18.37 kg/m.   General appearance :Awake, alert, not in any distress. Looks very frail, speech slow but clear. Eyes:, pupils equally reactive to light and accomodation,no scleral icterus.Pink conjunctiva HEENT: Atraumatic and Normocephalic Neck: supple, no JVD. No cervical lymphadenopathy. No thyromegaly Resp:Good air entry bilaterally, no added sounds  CVS: S1 S2 regular, GI: Bowel sounds present, Non tender and not distended with no gaurding, rigidity or rebound.No organomegaly Extremities: B/L Lower Ext shows no edema, both legs are warm to touch Neurology:  speech clear,Non focal-but with generalized weakness, sensation is grossly intact. Psychiatric: Normal judgment and insight. Alert and oriented x 3. Musculoskeletal:.No digital cyanosis Skin:No Rash, warm and dry Wounds:N/A  I have personally reviewed following labs and imaging studies  LABORATORY DATA: CBC:  Recent Labs Lab 08/22/16 1320 08/22/16 2027 08/23/16 0600 08/23/16 0724  WBC 5.9  7.5 6.3 5.7  NEUTROABS 3.2  --   --   --   HGB 11.3* 11.0* 10.2* 9.9*  HCT 37.2* 35.9* 33.4* 31.8*  MCV 100.8* 99.7 99.7 99.1  PLT 223 258 247 [id number]    Basic Metabolic Panel:  Recent Labs Lab 08/22/16 1320 08/22/16 2027 08/23/16 0600 08/23/16 0725  NA 138 137 136 138  K 4.4 4.4 4.2 4.1  CL 94* 95* 96* 96*  CO2 24 24 26 26   GLUCOSE 115* 121* 91 88  BUN 49* 54* 57* 57*  CREATININE 7.26* 7.51* 7.75* 7.77*  CALCIUM 8.5* 8.4* 8.4* 8.3*  PHOS  --  6.1*  --  6.8*    GFR: Estimated Creatinine Clearance: 7.5 mL/min (by C-G formula based on SCr of 7.77 mg/dL (H)).  Liver Function Tests:  Recent Labs Lab 08/22/16 1320 08/22/16 2027 08/23/16 0725  AST 20  --   --   ALT 16*  --   --   ALKPHOS 93  --   --   BILITOT 0.7  --   --   PROT 5.7*  --   --   ALBUMIN 3.3* 3.1* 2.9*   No results for input(s): LIPASE, AMYLASE in the last 168 hours. No results for input(s): AMMONIA in the last 168 hours.  Coagulation Profile: No results for input(s): INR, PROTIME in the last 168 hours.  Cardiac Enzymes:  Recent Labs Lab 08/22/16 1320  CKTOTAL 37*    BNP (last 3 results) No results for input(s): PROBNP in the last 8760 hours.  HbA1C: No results for input(s): HGBA1C in the last 72 hours.  CBG:  Recent Labs Lab 08/22/16 2006 08/23/16 1213  GLUCAP 139* 127*    Lipid Profile: No results for input(s): CHOL, HDL, LDLCALC, TRIG, CHOLHDL, LDLDIRECT in the last 72 hours.  Thyroid Function Tests: No results for input(s): TSH, T4TOTAL, FREET4, T3FREE, THYROIDAB in the last 72 hours.  Anemia Panel: No results for input(s): VITAMINB12, FOLATE, FERRITIN, TIBC, IRON, RETICCTPCT in the last 72 hours.  Urine analysis:    Component Value Date/Time   COLORURINE YELLOW 08/22/2016 1752   APPEARANCEUR TURBID (A) 08/22/2016 1752   LABSPEC 1.019 08/22/2016 1752   PHURINE 7.5 08/22/2016 1752   GLUCOSEU NEGATIVE 08/22/2016 1752   HGBUR MODERATE (A) 08/22/2016 1752   BILIRUBINUR  NEGATIVE 08/22/2016 1752   KETONESUR NEGATIVE 08/22/2016 1752   PROTEINUR 100 (A) 08/22/2016 1752   UROBILINOGEN 1.0 04/22/2015 1952   NITRITE NEGATIVE 08/22/2016 1752   LEUKOCYTESUR LARGE (A) 08/22/2016 1752    Sepsis Labs: Lactic Acid, Venous    Component Value Date/Time   LATICACIDVEN 1.1 08/22/2016 1750    MICROBIOLOGY: Recent Results (from the past 240 hour(s))  Culture, blood (Routine X 2) w Reflex to ID Panel     Status: None (Preliminary result)   Collection Time: 08/22/16  5:50 PM  Result Value Ref Range Status   Specimen Description BLOOD RIGHT HAND  Final   Special Requests IN PEDIATRIC BOTTLE 3 ML  Final   Culture NO GROWTH < 24 HOURS  Final   Report Status PENDING  Incomplete  Culture, blood (Routine X 2) w Reflex to ID Panel     Status: None (Preliminary result)   Collection Time: 08/22/16  6:05 PM  Result Value Ref Range Status   Specimen Description BLOOD RIGHT ANTECUBITAL  Final   Special Requests BOTTLES DRAWN AEROBIC AND ANAEROBIC 5 ML  Final   Culture NO GROWTH < 24 HOURS  Final   Report Status PENDING  Incomplete  MRSA PCR Screening     Status: None   Collection Time: 08/23/16  6:35 AM  Result Value Ref Range Status   MRSA by PCR NEGATIVE NEGATIVE Final    Comment:        The GeneXpert MRSA Assay (FDA approved for NASAL specimens only), is one component of a comprehensive MRSA colonization surveillance program. It is not intended to diagnose MRSA infection nor to guide or monitor treatment for MRSA infections.     RADIOLOGY STUDIES/RESULTS: Dg Chest 2 View  Result Date: 08/22/2016 CLINICAL DATA:  Weakness and vomiting for 2 weeks EXAM: CHEST  2 VIEW COMPARISON:  08/07/2016 FINDINGS: Previous median sternotomy and CABG procedure. Normal heart size. No pleural effusion or edema. Good residual changes of COPD identified. Chronic deformity involving the left humeral head noted. IMPRESSION: 1. No acute cardiopulmonary abnormalities. Electronically  Signed   By: Kerby Moors M.D.   On: 08/22/2016 13:55   Dg Chest 2 View  Result Date: 08/07/2016 CLINICAL DATA:  No chest pain or SOB Evaluate for changes. Diagnosed with fractured patella today EXAM: CHEST  2 VIEW COMPARISON:  Radiographs 04/22/2015 and 05/04/2016. FINDINGS: The heart size and mediastinal contours are stable status post CABG. Right IJ central venous catheter has been  removed. There is chronic scarring at the right lung base. The left lung is clear. There is no edema, pleural effusion or pneumothorax. Vascular surgical clips are present in the left upper arm. There is an old healed fracture of the proximal left humerus. IMPRESSION: Stable chest.  No acute cardiopulmonary process. Electronically Signed   By: Richardean Sale M.D.   On: 08/07/2016 16:01   Mr Brain Wo Contrast  Result Date: 08/09/2016 CLINICAL DATA:  75 year old male with syncope. Dialysis patient. Hypertensive, systolic [id number]. Initial encounter. EXAM: MRI HEAD WITHOUT CONTRAST TECHNIQUE: Multiplanar, multiecho pulse sequences of the brain and surrounding structures were obtained without intravenous contrast. COMPARISON:  Head and cervical spine CT 06/27/2016 and earlier. 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 Patchy and confluent cerebral white matter T2 and FLAIR hyperintensity which in places resembles chronic white matter lacunar infarcts. Moderate T2 heterogeneity in the deep gray matter nuclei with a chronic lacunar infarcts suspected in the right caudate. Fairly numerous scattered chronic micro hemorrhages in the brainstem, cerebellum, and both cerebral hemispheres. The largest areas of hemosiderin are in the right basal ganglia and right cerebellum. 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 No restricted diffusion or evidence of acute infarction. Previous right frontotemporal craniotomy. No midline shift, mass  effect, evidence of mass lesion, ventriculomegaly, extra-axial collection or acute intracranial hemorrhage. Cervicomedullary junction and pituitary are within normal limits. Vascular: Major intracranial vascular flow voids are preserved. Skull and upper cervical spine: Negative. Normal bone marrow signal. Sinuses/Orbits: Postoperative changes to both globes. No acute orbit soft tissue findings. Trace paranasal sinus mucosal thickening. Mild left mastoid effusion. Negative nasopharynx ; tiny right side retention cyst (series 5, image 5). Other: Negative scalp soft tissues. IMPRESSION: 1.  No acute intracranial abnormality. 2. Advanced chronic small vessel disease, including numerous chronic micro hemorrhages in the brain. 3. Previous right frontotemporal craniotomy. 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 Electronically Signed   By: Genevie Ann M.D.   On: 08/09/2016 12:43     LOS: 1 day   Oren Binet, MD  [facility] Pager:[phone]  If 7PM-7AM, please contact night-coverage [email or web address] Password [email or web address] 08/23/2016, 3:52 PM

## 2016-08-23 NOTE — Consult Note (Signed)
[facility] Renal Consultation Note    Indication for Consultation:  Management of ESRD/hemodialysis, anemia, hypertension/volume, and secondary hyperparathyroidism. PCP: Augusto Gamble, NP  HPI: [name] [name] is a 75 y.o. male with ESRD, HTN, CAD, Hx Heart Transplant (on immunosuppressants), dementia, hypothyroidism, and orthostatic hypotension/autonomic dysfunction (resulting in multiple syncopal episodes, including one fall which caused subdural hematoma 04/2016) who was admitted with generalized weakness. S/p recent admit 9/18-9/20 for syncope and treated for UTI at that time. Energy not improved since discharge, in fact weakness if worse and his wife has been unable to assist with him getting out of bed. He has missed 2 dialysis sessions in the past week due to these issues. He was admitted to determine cause of weakness as well as SNF/rehab placement. Denies fever, chills, cough, chest pain, dyspnea. He has had intermittent urinary urgency and diarrhea.  In the ED, labs were unrevealing. CXR clear. Blood and urine cultures were sent and are pending. His BP was extremely high at 245/98. He had not taken his BP medication that morning. Due to recurrent syncopal episodes due to orthostatic hypotension, his goal BP is higher than average. Last HD was 9/30. He dialyzes TTS at [facility] via LUE AVF.  Past Medical History:  Diagnosis Date  . Anemia   . Cancer ([place])    SKIN  . CKD III-IV    a. since transplant in 2001 with significant worsening since 06/2013.  Marland Kitchen Coronary artery disease   . Dementia    mild  . Diabetes mellitus without complication (HCC)    FASTING CBG 90S  . Headache(784.0)   . Heart transplanted ([place])    a. 1995 CABG x 4 in [place], [place];  b. 2001 Developed CHF;  c. 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 10/2000 Cardiac transplant @ [facility], [place];  e. Historically nl Bx and stress testing.  Last stress test ~ 5 yrs ago.  Marland Kitchen Hernia of abdominal wall    . History of blood transfusion   . History of stroke   . Hypercalcemia    a. 06/2013 Hospitalized in [place].  Marland Kitchen Hyperlipidemia   . Hypertension    a. Long h/o HTN, came off of all meds following transplant/wt loss-->meds resumed 06/2013, difficult to control since.  . Myocardial infarction    PRIOR TO HEART TRANSPLANT  . Orthostatic dizziness   . Orthostatic hypotension    a. Has tried support hose and compression stockings - prefers not to wear.  . Shortness of breath    EXERTION  . Stroke ([place]) 06/2013   NO RESIDUAL - WAS CONFUSED AT TIME - THAT HAS RESOLVED   Past Surgical History:  Procedure Laterality Date  . BASCILIC VEIN TRANSPOSITION Left 07/06/2014   Procedure: [place];  Surgeon: Rosetta Posner, MD;  Location: [facility];  Service: Vascular;  Laterality: Left;  . CARDIAC SURGERY    . CATARACT EXTRACTION    . CORONARY ARTERY BYPASS GRAFT    . CRANIOTOMY Right 05/04/2016   Procedure: Right Craniotomy for Evacuation of Subdural Hematoma;  Surgeon: Earnie Larsson, MD;  Location: [facility] NEURO ORS;  Service: Neurosurgery;  Laterality: Right;  . EYE SURGERY Bilateral    CATARACTS  . HEART TRANSPLANT    . SKIN GRAFT FULL THICKNESS LEG     cat scratch that did not heal  . VASECTOMY     Family History  Problem Relation Age of Onset  . Heart attack Father     died @ 67  .  Diabetes Father   . Heart attack Mother   . Diabetes Mother   . Kidney disease Maternal Aunt    Social History:  reports that he has never smoked. He has never used smokeless tobacco. He reports that he does not drink alcohol or use drugs. Allergies  Allergen Reactions  . Norvasc [Amlodipine Besylate] Swelling and Other (See Comments)    Swelling of the lower body   . Ace Inhibitors Other (See Comments)    On dialysis - cannot take these meds  . Angiotensin Receptor Blockers Other (See Comments)    On dialysis - cannot take these meds   Prior to Admission medications   Medication Sig Start Date End  Date Taking? Authorizing Provider  acetaminophen (TYLENOL) 325 MG tablet Take 2 tablets (650 mg total) by mouth every 6 (six) hours as needed for mild pain (or Fever >/= 101). Patient taking differently: Take 650 mg by mouth 2 (two) times daily.  10/14/14  Yes Delfina Redwood, MD  aspirin EC 81 MG tablet Take 81 mg by mouth at bedtime.   Yes Historical Provider, MD  cholecalciferol (VITAMIN D) 1000 UNITS tablet Take 1,000 Units by mouth daily.   Yes Historical Provider, MD  cycloSPORINE modified (NEORAL) 25 MG capsule Take 50 mg by mouth 2 (two) times daily.  08/18/14  Yes Lelon Perla, MD  diphenoxylate-atropine (LOMOTIL) 2.5-0.025 MG per tablet Take 1 tablet by mouth 2 (two) times daily as needed for diarrhea or loose stools.  03/01/15  Yes Historical Provider, MD  donepezil (ARICEPT) 10 MG tablet Take 10 mg by mouth at bedtime.  03/01/15  Yes Historical Provider, MD  FLUoxetine (PROZAC) 20 MG capsule Take 20 mg by mouth daily.    Yes Historical Provider, MD  lansoprazole (PREVACID) 30 MG capsule Take 30 mg by mouth See admin instructions. Take 1 capsule (30 mg) by mouth twice daily on Sunday, Monday, Wednesday, Friday (non-dialysis days) 12/22/15  Yes Historical Provider, MD  levothyroxine (SYNTHROID, LEVOTHROID) 75 MCG tablet Take 75 mcg by mouth daily.    Yes Historical Provider, MD  metoprolol tartrate (LOPRESSOR) 25 MG tablet Take 2 tablets (50 mg total) by mouth 2 (two) times daily. Patient taking differently: Take 75 mg by mouth See admin instructions. Take 75 mg by mouth daily on Sunday, Monday, Wednesday and Friday. Do not take on dialysis days. 12/27/14  Yes Ripudeep Krystal Eaton, MD  multivitamin (RENA-VIT) TABS tablet Take 1 tablet by mouth daily.  03/08/15  Yes Historical Provider, MD  mycophenolate (CELLCEPT) 250 MG capsule Take 250-500 mg by mouth See admin instructions. Take 500 mg by mouth in the morning and take 250 mg by mouth in the evening. On Tuesday, Thursday and Saturday only takes  500 mg by mouth in the evening.   Yes Historical Provider, MD  pravastatin (PRAVACHOL) 10 MG tablet Take 10 mg by mouth at bedtime.    Yes Historical Provider, MD  Probiotic Product (PRO-BIOTIC BLEND) CAPS Take 1 capsule by mouth daily.   Yes Historical Provider, MD  vitamin B-12 (CYANOCOBALAMIN) 1000 MCG tablet Take 1,000 mcg by mouth daily with breakfast.   Yes Historical Provider, MD  fluticasone (FLONASE) 50 MCG/ACT nasal spray Place 2 sprays into both nostrils at bedtime as needed for allergies.  04/12/16   Historical Provider, MD  oxyCODONE-acetaminophen (PERCOCET) 5-325 MG tablet Take 1 tablet by mouth every 4 (four) hours as needed. Patient not taking: Reported on 08/22/2016 06/27/16   Nat Christen, MD  Current Facility-Administered Medications  Medication Dose Route Frequency Provider Last Rate Last Dose  . 0.9 %  sodium chloride infusion  100 mL Intravenous PRN Valentina Gu, NP      . 0.9 %  sodium chloride infusion  100 mL Intravenous PRN Valentina Gu, NP      . acetaminophen (TYLENOL) tablet 650 mg  650 mg Oral BID Samella Parr, NP   650 mg at 08/22/16 2212  . alteplase (CATHFLO ACTIVASE) injection 2 mg  2 mg Intracatheter Once PRN Valentina Gu, NP      . aspirin EC tablet 81 mg  81 mg Oral QHS Samella Parr, NP   81 mg at 08/22/16 2212  . cholecalciferol (VITAMIN D) tablet 1,000 Units  1,000 Units Oral Daily Samella Parr, NP   1,000 Units at 08/22/16 2225  . clindamycin (CLEOCIN) IVPB 600 mg  600 mg Intravenous Q8H Samella Parr, NP 100 mL/hr at 08/23/16 0544 600 mg at 08/23/16 0544  . cycloSPORINE modified (NEORAL) capsule 50 mg  50 mg Oral BID Samella Parr, NP   50 mg at 08/22/16 2214  . donepezil (ARICEPT) tablet 10 mg  10 mg Oral QHS Samella Parr, NP   10 mg at 08/22/16 2215  . FLUoxetine (PROZAC) capsule 20 mg  20 mg Oral Daily Samella Parr, NP   20 mg at 08/22/16 2231  . fluticasone (FLONASE) 50 MCG/ACT nasal spray 2 spray  2 spray Each Nare  QHS PRN Samella Parr, NP      . heparin injection 1,000 Units  1,000 Units Dialysis PRN Valentina Gu, NP      . heparin injection 5,000 Units  5,000 Units Subcutaneous Q8H Samella Parr, NP   5,000 Units at 08/22/16 2225  . insulin aspart (novoLOG) injection 0-5 Units  0-5 Units Subcutaneous QHS Samella Parr, NP      . insulin aspart (novoLOG) injection 0-9 Units  0-9 Units Subcutaneous TID WC Samella Parr, NP      . lansoprazole (PREVACID SOLUTAB) disintegrating tablet 30 mg  30 mg Oral Once per day on Sun Mon Wed Fri Waldemar Dickens, MD      . levothyroxine (SYNTHROID, LEVOTHROID) tablet 75 mcg  75 mcg Oral QAC breakfast Samella Parr, NP      . lidocaine (PF) (XYLOCAINE) 1 % injection 5 mL  5 mL Intradermal PRN Valentina Gu, NP      . lidocaine-prilocaine (EMLA) cream 1 application  1 application Topical PRN Valentina Gu, NP      . metoprolol tartrate (LOPRESSOR) tablet 75 mg  75 mg Oral Once per day on Sun Mon Wed Fri Waldemar Dickens, MD      . multivitamin (RENA-VIT) tablet 1 tablet  1 tablet Oral Daily Samella Parr, NP   1 tablet at 08/22/16 2225  . mycophenolate (CELLCEPT) capsule 250 mg  250 mg Oral Once per day on Sun Mon Wed Fri Waldemar Dickens, MD      . mycophenolate (CELLCEPT) capsule 500 mg  500 mg Oral Daily Samella Parr, NP      . mycophenolate (CELLCEPT) capsule 500 mg  500 mg Oral Once per day on Tue Thu Sat Waldemar Dickens, MD   500 mg at 08/22/16 2213  . pentafluoroprop-tetrafluoroeth (GEBAUERS) aerosol 1 application  1 application Topical PRN Valentina Gu, NP      . pravastatin (PRAVACHOL) tablet 10  mg  10 mg Oral QHS Samella Parr, NP   10 mg at 08/22/16 2215  . vitamin B-12 (CYANOCOBALAMIN) tablet 1,000 mcg  1,000 mcg Oral Q breakfast Samella Parr, NP       Labs: Basic Metabolic Panel:  Recent Labs Lab 08/22/16 2027 08/23/16 0600 08/23/16 0725  NA 137 136 138  K 4.4 4.2 4.1  CL 95* 96* 96*  CO2 24 26 26   GLUCOSE  121* 91 88  BUN 54* 57* 57*  CREATININE 7.51* 7.75* 7.77*  CALCIUM 8.4* 8.4* 8.3*  PHOS 6.1*  --  6.8*   Liver Function Tests:  Recent Labs Lab 08/22/16 1320 08/22/16 2027 08/23/16 0725  AST 20  --   --   ALT 16*  --   --   ALKPHOS 93  --   --   BILITOT 0.7  --   --   PROT 5.7*  --   --   ALBUMIN 3.3* 3.1* 2.9*   No results for input(s): LIPASE, AMYLASE in the last 168 hours. No results for input(s): AMMONIA in the last 168 hours. CBC:  Recent Labs Lab 08/22/16 1320 08/22/16 2027 08/23/16 0600 08/23/16 0724  WBC 5.9 7.5 6.3 5.7  NEUTROABS 3.2  --   --   --   HGB 11.3* 11.0* 10.2* 9.9*  HCT 37.2* 35.9* 33.4* 31.8*  MCV 100.8* 99.7 99.7 99.1  PLT 223 258 247 224   Cardiac Enzymes:  Recent Labs Lab 08/22/16 1320  CKTOTAL 37*   CBG:  Recent Labs Lab 08/22/16 2006  GLUCAP 139*   Studies/Results: Dg Chest 2 View  Result Date: 08/22/2016 CLINICAL DATA:  Weakness and vomiting for 2 weeks EXAM: CHEST  2 VIEW COMPARISON:  08/07/2016 FINDINGS: Previous median sternotomy and CABG procedure. Normal heart size. No pleural effusion or edema. Good residual changes of COPD identified. Chronic deformity involving the left humeral head noted. IMPRESSION: 1. No acute cardiopulmonary abnormalities. Electronically Signed   By: Kerby Moors M.D.   On: 08/22/2016 13:55   ROS: As per HPI otherwise negative.  Physical Exam: Vitals:   08/23/16 0525 08/23/16 0650 08/23/16 0659 08/23/16 0730  BP: (!) 192/64 (!) 215/98 (!) 206/91 (!) 194/97  Pulse: 62 65 64 64  Resp: 17 16 17    Temp: 97.9 F (36.6 C) 97.8 F (36.6 C)    TempSrc: Oral Oral    SpO2: 99%     Weight:  65.9 kg (145 lb 4.5 oz)    Height:         General: Frail elderly male, in no acute distress. Wears hearing aids. Head: Normocephalic, atraumatic, sclera non-icteric, mucus membranes are moist. Neck: Supple without lymphadenopathy/masses. JVD not elevated. Lungs: Clear bilaterally to auscultation without  wheezes, rales, or rhonchi. Breathing is unlabored. Heart: RRR with normal S1, S2. Occ ectopic beat. No murmur appreciated. Abdomen: Soft, non-tender, non-distended with normoactive bowel sounds. No rebound/guarding. No obvious abdominal masses. Musculoskeletal:  Generalized weakness. No focal abnormality. Lower extremities: No edema or ischemic changes, no open wounds. Neuro: Alert and oriented X 3. Moves all extremities spontaneously. Psych:  Responds to questions appropriately with a normal affect. Dialysis Access: LUE AVF + thrill/bruit  Dialysis Orders: Center: [facility] on TTS. 4 hours, 2k/2.25Ca, EDW 68kg, BFR 400/DFR A1.5 - Heparin: NONE - Mircera 162mcg q 2 weeks (last dose 9/21) - Hectoral 38mcg IV q HD - Venofer 50mg  IV weekly, although tsat 11% on 9/30 labs at dialysis.  Assessment/Plan: 1. Weakness: Work-up  ongoing; cultures pending. On IV Clindamycin. 2. ESRD:  TTS schedule, missed yesterday so dialyzing today. Will plan for HD again 10/5 to get back on TTS track. No heparin with HD. 1.5L UF goal today. 3. Hypertension/volume: BP high, but drops dramatically on dialysis. On metoprolol 75mg  QD only. No edema. 4. Anemia: Hgb 9.9; will give Aranesp 141mcg IV today since due for ESA. Will need to start IV iron once verify negative blood cultures given low Tsat. 5. Metabolic bone disease: Ca ok, Phos high. Restart home binder (phoslo 1/meals). 6. Nutrition:  Albumin 2.9. Rec high protein diet. Pt reports Nepro causes GI issues.  Veneta Penton, PA-C 08/23/2016, 8:11 AM  [employer] Pager: [phone]  Pt seen, examined and agree w A/P as above.  [name] MD [employer] pager [phone]   08/23/2016, 12:57 PM

## 2016-08-23 NOTE — Evaluation (Signed)
Physical Therapy Evaluation Patient Details Name: [name] [name] MRN: [id number] DOB: 06-20-1941 Today's Date: 08/23/2016   History of Present Illness  [name] [name] is a 75 y.o. male with a Past Medical History of anemia, ESRD on dialysis, dementia, CAD, heart transplant, CVA, HLD, HTN who presents with physical deconditioning in need of dialysis.   Clinical Impression   Pt admitted with above diagnosis. Pt currently with functional limitations due to the deficits listed below (see PT Problem List).  Pt will benefit from skilled PT to increase their independence and safety with mobility to allow discharge to the venue listed below.       Follow Up Recommendations CIR    Equipment Recommendations  None recommended by PT    Recommendations for Other Services OT consult     Precautions / Restrictions Precautions Precautions: Fall      Mobility  Bed Mobility Overal bed mobility: Needs Assistance Bed Mobility: Rolling;Sidelying to Sit Rolling: Mod assist Sidelying to sit: Mod assist       General bed mobility comments: Decr initiation of movement, and required tactile cueing to follow completely through with task; mod assist to elevate trunk to sitting  Transfers Overall transfer level: Needs assistance Equipment used: 2 person hand held assist Transfers: Stand Pivot Transfers   Stand pivot transfers: Mod assist;+2 physical assistance       General transfer comment: Heavy mod assist to rise and pivot; bil UE support, and blocked R knee for stability during transfer  Ambulation/Gait                Stairs            Wheelchair Mobility    Modified Rankin (Stroke Patients Only)       Balance Overall balance assessment: Needs assistance Sitting-balance support: Bilateral upper extremity supported Sitting balance-Leahy Scale: Poor Sitting balance - Comments: Needs assist to stay in upright sitting; tends to lean backwards Postural control:  Posterior lean   Standing balance-Leahy Scale: Zero                               Pertinent Vitals/Pain Pain Assessment: No/denies pain    Home Living Family/patient expects to be discharged to:: Private residence Living Arrangements: Spouse/significant other Available Help at Discharge: Family;Available 24 hours/day (son lives in same complex) Type of Home: Apartment Home Access: Stairs to enter Entrance Stairs-Rails: Right Entrance Stairs-Number of Steps: 3 Home Layout: One level Home Equipment: Walker - 4 wheels;Cane - single point;Bedside commode;Shower seat;Toilet riser;Grab bars - tub/shower;Transport chair      Prior Function Level of Independence: Needs assistance   Gait / Transfers Assistance Needed: uses rollator at baseline, and on dialysis days uses w/c           Hand Dominance   Dominant Hand: Right    Extremity/Trunk Assessment   Upper Extremity Assessment: Generalized weakness           Lower Extremity Assessment: Generalized weakness         Communication   Communication: HOH  Cognition Arousal/Alertness: Awake/alert Behavior During Therapy: WFL for tasks assessed/performed Overall Cognitive Status: Within Functional Limits for tasks assessed                      General Comments      Exercises     Assessment/Plan    PT Assessment Patient needs continued PT services  PT Problem List Decreased strength;Decreased  activity tolerance;Decreased mobility;Decreased balance;Decreased coordination;Decreased cognition;Decreased knowledge of use of DME;Decreased safety awareness          PT Treatment Interventions DME instruction;Gait training;Stair training;Functional mobility training;Therapeutic activities;Therapeutic exercise;Balance training;Neuromuscular re-education;Patient/family education    PT Goals (Current goals can be found in the Care Plan section)  Acute Rehab PT Goals Patient Stated Goal: To get stronger  and back home PT Goal Formulation: With patient Time For Goal Achievement: 09/06/16 Potential to Achieve Goals: Good    Frequency Min 3X/week   Barriers to discharge        Co-evaluation               End of Session Equipment Utilized During Treatment: Gait belt Activity Tolerance: Patient tolerated treatment well Patient left: in chair;with call bell/phone within reach Nurse Communication: Mobility status         Time: [id number] PT Time Calculation (min) (ACUTE ONLY): 21 min   Charges:   PT Evaluation $PT Eval Moderate Complexity: 1 Procedure     PT G CodesQuin [name] 08/23/2016, 4:47 PM  [name] [name], [place] Pager [phone] Office [phone]

## 2016-08-24 LAB — RENAL FUNCTION PANEL
Albumin: 3 g/dL — ABNORMAL LOW (ref 3.5–5.0)
Anion gap: 10 (ref 5–15)
BUN: 19 mg/dL (ref 6–20)
CALCIUM: 8.4 mg/dL — AB (ref 8.9–10.3)
CHLORIDE: 99 mmol/L — AB (ref 101–111)
CO2: 27 mmol/L (ref 22–32)
CREATININE: 4.07 mg/dL — AB (ref 0.61–1.24)
GFR calc Af Amer: 15 mL/min — ABNORMAL LOW (ref >60)
GFR calc non Af Amer: 13 mL/min — ABNORMAL LOW (ref >60)
GLUCOSE: 80 mg/dL (ref 65–99)
Phosphorus: 4.4 mg/dL (ref 2.5–4.6)
Potassium: 3.8 mmol/L (ref 3.5–5.1)
SODIUM: 136 mmol/L (ref 135–145)

## 2016-08-24 LAB — URINE CULTURE: Culture: >=100000 — AB

## 2016-08-24 LAB — GLUCOSE, CAPILLARY
Glucose-Capillary: 79 mg/dL (ref 65–99)
Glucose-Capillary: 156 mg/dL — ABNORMAL HIGH (ref 65–99)

## 2016-08-24 LAB — HEPATITIS B SURFACE ANTIGEN: Hepatitis B Surface Ag: NEGATIVE

## 2016-08-24 MED ORDER — VANCOMYCIN HCL IN DEXTROSE 750-5 MG/150ML-% IV SOLN: 750.0000 mg | INTRAVENOUS | Status: DC

## 2016-08-24 MED ORDER — DIPHENOXYLATE-ATROPINE 2.5-0.025 MG PO TABS: 1.0000 | ORAL_TABLET | Freq: Four times a day (QID) | ORAL | Status: AC | PRN

## 2016-08-24 MED ORDER — CALCIUM ACETATE (PHOS BINDER) 667 MG PO CAPS: 667.0000 mg | ORAL_CAPSULE | Freq: Three times a day (TID) | ORAL | Status: DC

## 2016-08-24 MED ORDER — SODIUM CHLORIDE 0.9 % IV SOLN
1500.0000 mg | Freq: Once | INTRAVENOUS | Status: AC
  Administered 2016-08-24: 1500 mg via INTRAVENOUS
  Filled 2016-08-24: qty 1500

## 2016-08-24 NOTE — NC FL2 (Signed)
[place] LEVEL OF CARE SCREENING TOOL     IDENTIFICATION  Patient Name: [name] [name] Birthdate: 1940-12-04 Sex: male Admission Date (Current Location): 08/22/2016  [facility] and [place] Number:  Herbalist and Address:  [facility]. [facility] [place] [address], [place], [place] [address]      Provider Number: [id number]  Attending Physician Name and Address:  Jonetta Osgood, MD  Relative Name and Phone Number:  Zhayden Longobardi (Wife) - [phone]    Current Level of Care: Hospital Recommended Level of Care: [place] Prior Approval Number:    Date Approved/Denied:   PASRR Number: [id number] (Effective 10/09/2014)  Discharge Plan: SNF    Current Diagnoses: Patient Active Problem List   Diagnosis Date Noted  . Generalized weakness 08/22/2016  . Urinary tract infection, site not specified   . Syncope and collapse   . Syncope 08/07/2016  . Faintness   . Uncontrolled hypertension   . Acute blood loss anemia   . Cardiomyopathy ([place])   . Controlled diabetes mellitus type 2 with complications ([place])   . 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 Traumatic subdural hematoma ([place]) 05/04/2016  . 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 Subdural hematoma ([place]) 05/02/2016  . CKD (chronic kidney disease)   . Protein-calorie malnutrition, severe ([place]) 04/23/2015  . Anemia of chronic disease 04/23/2015  . Depression 04/23/2015  . UTI (urinary tract infection) 04/22/2015  . Barrett's esophagus 03/22/2015  . ESRD on dialysis ([place]) 10/12/2014  . DM (diabetes mellitus), type 2 with renal complications ([place]) [id number]  . Hypothyroidism 10/06/2014  . Heart transplanted ([place])   . Hypercholesterolemia 06/20/2014  . 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[place]) 06/20/2014    Orientation RESPIRATION BLADDER Height & Weight     Self, Time, Situation, Place  Normal Continent Weight: 146  lb 9.7 oz (66.5 kg) Height:  6\' 2"  (188 cm)  BEHAVIORAL SYMPTOMS/MOOD NEUROLOGICAL BOWEL NUTRITION STATUS  Other (Comment) (Alert)   Incontinent DIET: Carb modified  AMBULATORY STATUS COMMUNICATION OF NEEDS Skin   Patient was unable to ambulate with PT  Verbally Other (Comment) (Eczema on left and right arms)                       Personal Care Assistance Level of Assistance  Bathing, Feeding, Dressing Bathing Assistance: Maximum assistance Feeding assistance: Independent (Needs setup) Dressing Assistance: Maximum assistance     Functional Limitations Info  Sight, Hearing, Speech Sight Info: Adequate Hearing Info: Impaired (Hard of hearing) Speech Info: Adequate    SPECIAL CARE FACTORS FREQUENCY  PT (By licensed PT)     PT Frequency: PT evaluation completed on 08/23/2016 - recommeneded frequency - 3x per week              Contractures Contractures Info: Not present    Additional Factors Info  Code Status, Allergies  Insulin Code Status Info: Full Code Allergies Info: Norvasc Amlodipine Besylate, Ace Inhibitors, Angiotensin Receptor Blockers  Insulin 0-9 Units 3X per day with meals and insulin 0-5 Units daily at bedtime.           Current Medications (08/24/2016):  This is the current hospital active medication list Current Facility-Administered Medications  Medication Dose Route Frequency Provider Last Rate Last Dose  . acetaminophen (TYLENOL) tablet 650 mg  650 mg Oral Q6H PRN Jonetta Osgood, MD   650 mg at 08/23/16 1732  . aspirin EC tablet  81 mg  81 mg Oral QHS Samella Parr, NP   81 mg at 08/23/16 2108  . calcium acetate (PHOSLO) capsule 667 mg  667 mg Oral TID WC Wood[place] Ganja Stovall, PA-C   667 mg at 08/23/16 1731  . cholecalciferol (VITAMIN D) tablet 1,000 Units  1,000 Units Oral Daily Samella Parr, NP   1,000 Units at 08/24/16 1226  . cycloSPORINE modified (NEORAL) capsule 50 mg  50 mg Oral BID Samella Parr, NP   50 mg at 08/24/16 1227  .  Darbepoetin Alfa (ARANESP) injection 100 mcg  100 mcg Intravenous Once Loren Racer, PA-C      . donepezil (ARICEPT) tablet 10 mg  10 mg Oral QHS Samella Parr, NP   10 mg at 08/23/16 2108  . FLUoxetine (PROZAC) capsule 20 mg  20 mg Oral Daily Samella Parr, NP   20 mg at 08/24/16 1226  . fluticasone (FLONASE) 50 MCG/ACT nasal spray 2 spray  2 spray Each Nare QHS PRN Samella Parr, NP      . heparin injection 5,000 Units  5,000 Units Subcutaneous Q8H Samella Parr, NP   5,000 Units at 08/23/16 2108  . insulin aspart (novoLOG) injection 0-5 Units  0-5 Units Subcutaneous QHS Samella Parr, NP      . insulin aspart (novoLOG) injection 0-9 Units  0-9 Units Subcutaneous TID WC Samella Parr, NP   2 Units at 08/23/16 1732  . lansoprazole (PREVACID SOLUTAB) disintegrating tablet 30 mg  30 mg Oral Once per day on Sun Mon Wed Fri Waldemar Dickens, MD   30 mg at 08/23/16 1156  . levothyroxine (SYNTHROID, LEVOTHROID) tablet 75 mcg  75 mcg Oral QAC breakfast Samella Parr, NP   75 mcg at 08/24/16 1225  . metoprolol tartrate (LOPRESSOR) tablet 75 mg  75 mg Oral Once per day on Sun Mon Wed Fri Waldemar Dickens, MD   75 mg at 08/23/16 1402  . multivitamin (RENA-VIT) tablet 1 tablet  1 tablet Oral Daily Samella Parr, NP   1 tablet at 08/24/16 1225  . mycophenolate (CELLCEPT) capsule 250 mg  250 mg Oral Once per day on Sun Mon Wed Fri Waldemar Dickens, MD   250 mg at 08/23/16 2113  . mycophenolate (CELLCEPT) capsule 500 mg  500 mg Oral Daily Samella Parr, NP   500 mg at 08/24/16 1226  . mycophenolate (CELLCEPT) capsule 500 mg  500 mg Oral Once per day on Tue Thu Sat Waldemar Dickens, MD   500 mg at 08/22/16 2213  . pravastatin (PRAVACHOL) tablet 10 mg  10 mg Oral QHS Samella Parr, NP   10 mg at 08/23/16 2108  . vancomycin (VANCOCIN) 1,500 mg in sodium chloride 0.9 % 500 mL IVPB  1,500 mg Intravenous Once Jonetta Osgood, MD   1,500 mg at 08/24/16 1108  . [START ON 08/26/2016] vancomycin  (VANCOCIN) IVPB 750 mg/150 ml premix  750 mg Intravenous Q T,Th,Sa-HD Jonetta Osgood, MD      . vitamin B-12 (CYANOCOBALAMIN) tablet 1,000 mcg  1,000 mcg Oral Q breakfast Samella Parr, NP   1,000 mcg at 08/23/16 1155     Discharge Medications: Please see discharge summary for a list of discharge medications.  Relevant Imaging Results:  Relevant Lab Results:   Additional Information SSN:  [id number] .  Dialysis patient TTS at [facility] dialysis center.  Linward Headland, [place] Work [phone]

## 2016-08-24 NOTE — Progress Notes (Signed)
Report called to [facility] [place].  All questions answered.

## 2016-08-24 NOTE — Progress Notes (Signed)
[facility] Progress Note   Subjective: no c/o, UCx +CNSS  Vitals:   08/24/16 0720 08/24/16 0725 08/24/16 0800 08/24/16 0830  BP: (!) 200/98 (!) 200/97 (!) 200/96 130/64  Pulse: 62 63 64 70  Resp: 18 18    Temp: 98.3 F (36.8 C)     TempSrc: Oral     SpO2: 99%     Weight: 66.5 kg (146 lb 9.7 oz)     Height:        Inpatient medications: . aspirin EC  81 mg Oral QHS  . calcium acetate  667 mg Oral TID WC  . cholecalciferol  1,000 Units Oral Daily  . clindamycin (CLEOCIN) IV  600 mg Intravenous Q8H  . cycloSPORINE modified  50 mg Oral BID  . darbepoetin (ARANESP) injection - DIALYSIS  100 mcg Intravenous Once  . donepezil  10 mg Oral QHS  . FLUoxetine  20 mg Oral Daily  . heparin  5,000 Units Subcutaneous Q8H  . insulin aspart  0-5 Units Subcutaneous QHS  . insulin aspart  0-9 Units Subcutaneous TID WC  . lansoprazole  30 mg Oral Once per day on Sun Mon Wed Fri  . levothyroxine  75 mcg Oral QAC breakfast  . metoprolol tartrate  75 mg Oral Once per day on Sun Mon Wed Fri  . multivitamin  1 tablet Oral Daily  . mycophenolate  250 mg Oral Once per day on Sun Mon Wed Fri  . mycophenolate  500 mg Oral Daily  . mycophenolate  500 mg Oral Once per day on Tue Thu Sat  . pravastatin  10 mg Oral QHS  . vitamin B-12  1,000 mcg Oral Q breakfast     acetaminophen, fluticasone  Exam: Alert, no distress, elderly male, pleasant No jvd Chest clear bilat RRR no mrg Abd soft ntnd no ascites GU normal male Ext muscle wasting, no edema NF, ox 3 LUE AVF +Bruit  Dialysis: AF TTS 4h  2/2.25 bath  68kg    Hep none - Mircera 19mcg q 2 weeks (last dose 9/21) - Hectoral 43mcg IV q HD - Venofer 50mg  IV weekly, although tsat 11% on 9/30 labs at dialysis.      Assessment: 1. Gen weakness - unable to transfer and get to HD.  His two children had been helping w transfers, but they both had major cardiac surgery just recently and his wife is unable to get him OOB and into  the WC.  Plan is for SNF placement.  Will get him OOB into a chair after Hd today to approximate OP HD experience.  If can sit in a chair for several hrs then can be dc'd to SNF.  2. UTI - recurrent, coag neg staph.  Has pyuria on UA, but no symptoms.  D/W primary, will get ID curbside.  3. ESRD on HD TTS 4. Volume - slightly under dry wt, no fluid off w HD today 5. Dementia 6. Heart transplant - cont meds 7. Chron orthostatic hypotension/ systemic HTN - his particular regimen w BP meds to prevent overtreatment  Plan - HD today, abx or not per primary, SNFP pending, OOB to chair   [name] MD [place] pager [phone]   08/24/2016, 9:25 AM    Recent Labs Lab 08/22/16 2027 08/23/16 0600 08/23/16 0725 08/24/16 0748  NA 137 136 138 136  K 4.4 4.2 4.1 3.8  CL 95* 96* 96* 99*  CO2 24 26 26 27   GLUCOSE 121* 91  88 80  BUN 54* 57* 57* 19  CREATININE 7.51* 7.75* 7.77* 4.07*  CALCIUM 8.4* 8.4* 8.3* 8.4*  PHOS 6.1*  --  6.8* 4.4    Recent Labs Lab 08/22/16 1320 08/22/16 2027 08/23/16 0725 08/24/16 0748  AST 20  --   --   --   ALT 16*  --   --   --   ALKPHOS 93  --   --   --   BILITOT 0.7  --   --   --   PROT 5.7*  --   --   --   ALBUMIN 3.3* 3.1* 2.9* 3.0*    Recent Labs Lab 08/22/16 1320 08/22/16 2027 08/23/16 0600 08/23/16 0724  WBC 5.9 7.5 6.3 5.7  NEUTROABS 3.2  --   --   --   HGB 11.3* 11.0* 10.2* 9.9*  HCT 37.2* 35.9* 33.4* 31.8*  MCV 100.8* 99.7 99.7 99.1  PLT 223 258 247 224   Iron/TIBC/Ferritin/ %Sat    Component Value Date/Time   IRON 38 (L) 08/25/2014 1831   TIBC 143 (L) 08/25/2014 1831   FERRITIN 612 (H) 08/25/2014 1831   IRONPCTSAT 27 08/25/2014 1831

## 2016-08-24 NOTE — Consult Note (Signed)
Physical Medicine and Rehabilitation Consult   Reason for Consult: debility Referring Physician: Dr. Sloan Leiter   HPI: [name] [name] is a 75 y.o. male with history of ESRD, HTN, CAD s/p heart transplant, dementia, recent patella fracture,  multiple syncopal episodes --most recent with episode 9/18-9/20 with ongoing weakness, inability to get out of bed  and inability to get to his  HD treatments.  He was admitted on 08/22/16 for work up of weakness. He was found to have UTI and started on clindamycin. PT evaluation done revealing debility and CIR recommended for follow up therapy.  Has an aide 3 X week to help with ADLs. Has been getting HHPT/HHOT but has been staying in bed for past couple of months with  limited mobility due to patella fracture. Wife helps as able.   Review of Systems  Constitutional: Positive for malaise/fatigue.  HENT: Positive for hearing loss.   Eyes: Negative for blurred vision and double vision.  Respiratory: Negative for cough and shortness of breath.   Cardiovascular: Negative for chest pain and palpitations.  Gastrointestinal: Negative for abdominal pain and heartburn.  Musculoskeletal: Positive for falls, myalgias and neck pain.  Skin: Negative for itching and rash.  Neurological: Positive for dizziness, weakness and headaches. Negative for focal weakness.  Psychiatric/Behavioral: Positive for memory loss. Negative for depression. The patient is not nervous/anxious.       Past Medical History:  Diagnosis Date  . Anemia   . Cancer ([place])    SKIN  . CKD III-IV    a. since transplant in 2001 with significant worsening since 06/2013.  Marland Kitchen Coronary artery disease   . Dementia    mild  . Diabetes mellitus without complication (HCC)    FASTING CBG 90S  . Headache(784.0)   . Heart transplanted ([place])    a. 1995 CABG x 4 in [place], [place];  b. 2001 Developed CHF;  c. 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 10/2000 Cardiac transplant @ [facility], [place];  e.  Historically nl Bx and stress testing.  Last stress test ~ 5 yrs ago.  Marland Kitchen Hernia of abdominal wall   . History of blood transfusion   . History of stroke   . Hypercalcemia    a. 06/2013 Hospitalized in [place].  Marland Kitchen Hyperlipidemia   . Hypertension    a. Long h/o HTN, came off of all meds following transplant/wt loss-->meds resumed 06/2013, difficult to control since.  . Myocardial infarction    PRIOR TO HEART TRANSPLANT  . Orthostatic dizziness   . Orthostatic hypotension    a. Has tried support hose and compression stockings - prefers not to wear.  . Shortness of breath    EXERTION  . Stroke ([place]) 06/2013   NO RESIDUAL - WAS CONFUSED AT TIME - THAT HAS RESOLVED    Past Surgical History:  Procedure Laterality Date  . BASCILIC VEIN TRANSPOSITION Left 07/06/2014   Procedure: [place];  Surgeon: Rosetta Posner, MD;  Location: [facility];  Service: Vascular;  Laterality: Left;  . CARDIAC SURGERY    . CATARACT EXTRACTION    . CORONARY ARTERY BYPASS GRAFT    . CRANIOTOMY Right 05/04/2016   Procedure: Right Craniotomy for Evacuation of Subdural Hematoma;  Surgeon: Earnie Larsson, MD;  Location: [facility] NEURO ORS;  Service: Neurosurgery;  Laterality: Right;  . EYE SURGERY Bilateral    CATARACTS  . HEART TRANSPLANT    . SKIN GRAFT FULL THICKNESS LEG     cat scratch that  did not heal  . VASECTOMY      Family History  Problem Relation Age of Onset  . Heart attack Father     died @ 22  . Diabetes Father   . Heart attack Mother   . Diabetes Mother   . Kidney disease Maternal Aunt     Social History:  reports that he has never smoked. He has never used smokeless tobacco. He reports that he does not drink alcohol or use drugs.    Allergies  Allergen Reactions  . Norvasc [Amlodipine Besylate] Swelling and Other (See Comments)    Swelling of the lower body   . Ace Inhibitors Other (See Comments)    On dialysis - cannot take these meds  . Angiotensin Receptor Blockers Other (See Comments)     On dialysis - cannot take these meds    Medications Prior to Admission  Medication Sig Dispense Refill  . acetaminophen (TYLENOL) 325 MG tablet Take 2 tablets (650 mg total) by mouth every 6 (six) hours as needed for mild pain (or Fever >/= 101). (Patient taking differently: Take 650 mg by mouth 2 (two) times daily. )    . aspirin EC 81 MG tablet Take 81 mg by mouth at bedtime.    . cholecalciferol (VITAMIN D) 1000 UNITS tablet Take 1,000 Units by mouth daily.    . cycloSPORINE modified (NEORAL) 25 MG capsule Take 50 mg by mouth 2 (two) times daily.     . diphenoxylate-atropine (LOMOTIL) 2.5-0.025 MG per tablet Take 1 tablet by mouth 2 (two) times daily as needed for diarrhea or loose stools.     . donepezil (ARICEPT) 10 MG tablet Take 10 mg by mouth at bedtime.     Marland Kitchen FLUoxetine (PROZAC) 20 MG capsule Take 20 mg by mouth daily.     . lansoprazole (PREVACID) 30 MG capsule Take 30 mg by mouth See admin instructions. Take 1 capsule (30 mg) by mouth twice daily on Sunday, Monday, Wednesday, Friday (non-dialysis days)    . levothyroxine (SYNTHROID, LEVOTHROID) 75 MCG tablet Take 75 mcg by mouth daily.     . metoprolol tartrate (LOPRESSOR) 25 MG tablet Take 2 tablets (50 mg total) by mouth 2 (two) times daily. (Patient taking differently: Take 75 mg by mouth See admin instructions. Take 75 mg by mouth daily on Sunday, Monday, Wednesday and Friday. Do not take on dialysis days.) 120 tablet 0  . multivitamin (RENA-VIT) TABS tablet Take 1 tablet by mouth daily.   5  . mycophenolate (CELLCEPT) 250 MG capsule Take 250-500 mg by mouth See admin instructions. Take 500 mg by mouth in the morning and take 250 mg by mouth in the evening. On Tuesday, Thursday and Saturday only takes 500 mg by mouth in the evening.    . pravastatin (PRAVACHOL) 10 MG tablet Take 10 mg by mouth at bedtime.     . Probiotic Product (PRO-BIOTIC BLEND) CAPS Take 1 capsule by mouth daily.    . vitamin B-12 (CYANOCOBALAMIN) 1000 MCG  tablet Take 1,000 mcg by mouth daily with breakfast.    . fluticasone (FLONASE) 50 MCG/ACT nasal spray Place 2 sprays into both nostrils at bedtime as needed for allergies.     Marland Kitchen oxyCODONE-acetaminophen (PERCOCET) 5-325 MG tablet Take 1 tablet by mouth every 4 (four) hours as needed. (Patient not taking: Reported on 08/22/2016) 20 tablet 0    Home: Home Living Family/patient expects to be discharged to:: Private residence Living Arrangements: Spouse/significant other Available Help at Discharge:  Family, Available 24 hours/day (son lives in same complex) Type of Home: Apartment Home Access: Stairs to enter Technical brewer of Steps: 3 Entrance Stairs-Rails: Right Home Layout: One level Bathroom Shower/Tub: Chiropodist: Standard Home Equipment: Environmental consultant - 4 wheels, [employer] - single point, Bedside commode, Shower seat, Toilet riser, Grab bars - tub/shower, Industrial/product designer History: Prior Function Level of Independence: Needs assistance Gait / Transfers Assistance Needed: uses rollator at baseline, and on dialysis days uses w/c Functional Status:  Mobility: Bed Mobility Overal bed mobility: Needs Assistance Bed Mobility: Rolling, Sidelying to Sit Rolling: Mod assist Sidelying to sit: Mod assist General bed mobility comments: Decr initiation of movement, and required tactile cueing to follow completely through with task; mod assist to elevate trunk to sitting Transfers Overall transfer level: Needs assistance Equipment used: 2 person hand held assist Transfers: Stand Pivot Transfers Stand pivot transfers: Mod assist, +2 physical assistance General transfer comment: Heavy mod assist to rise and pivot; bil UE support, and blocked R knee for stability during transfer      ADL:    Cognition: Cognition Overall Cognitive Status: Within Functional Limits for tasks assessed Orientation Level: Oriented X4 Cognition Arousal/Alertness:  Awake/alert Behavior During Therapy: WFL for tasks assessed/performed Overall Cognitive Status: Within Functional Limits for tasks assessed   Blood pressure 130/64, pulse 70, temperature 98.3 F (36.8 C), temperature source Oral, resp. rate 18, height 6\' 2"  (1.88 m), weight 66.5 kg (146 lb 9.7 oz), SpO2 99 %. Physical Exam  Nursing note and vitals reviewed. Constitutional: He is oriented to person, place, and time. He appears well-developed and well-nourished. No distress.  HENT:  Head: Normocephalic and atraumatic.  Mouth/Throat: Oropharynx is clear and moist.  Eyes: Conjunctivae are normal. Pupils are equal, round, and reactive to light.  Neck: Normal range of motion. Neck supple.  Cardiovascular: Normal rate and regular rhythm.   Respiratory: Effort normal and breath sounds normal. No respiratory distress. He has no wheezes.  GI: Soft. Bowel sounds are normal. He exhibits no distension. There is no tenderness.  Musculoskeletal: He exhibits no edema or tenderness.  Neurological: He is alert and oriented to person, place, and time.  HOH. Speech clear and able to follow basic commands without difficulty.   Skin: Skin is warm and dry.  Psychiatric: His speech is normal and behavior is normal. His affect is blunt.    Results for orders placed or performed during the hospital encounter of 08/22/16 (from the past 24 hour(s))  Glucose, capillary     Status: Abnormal   Collection Time: 08/23/16 12:13 PM  Result Value Ref Range   Glucose-Capillary 127 (H) 65 - 99 mg/dL  Glucose, capillary     Status: Abnormal   Collection Time: 08/23/16  5:15 PM  Result Value Ref Range   Glucose-Capillary 155 (H) 65 - 99 mg/dL  Glucose, capillary     Status: Abnormal   Collection Time: 08/23/16 10:35 PM  Result Value Ref Range   Glucose-Capillary 115 (H) 65 - 99 mg/dL  C difficile quick scan w PCR reflex     Status: None   Collection Time: 08/23/16 10:37 PM  Result Value Ref Range   C Diff antigen  NEGATIVE NEGATIVE   C Diff toxin NEGATIVE NEGATIVE   C Diff interpretation No C. difficile detected.   Renal function panel     Status: Abnormal   Collection Time: 08/24/16  7:48 AM  Result Value Ref Range   Sodium 136 135 - 145  mmol/L   Potassium 3.8 3.5 - 5.1 mmol/L   Chloride 99 (L) 101 - 111 mmol/L   CO2 27 22 - 32 mmol/L   Glucose, Bld 80 65 - 99 mg/dL   BUN 19 6 - 20 mg/dL   Creatinine, Ser 4.07 (H) 0.61 - 1.24 mg/dL   Calcium 8.4 (L) 8.9 - 10.3 mg/dL   Phosphorus 4.4 2.5 - 4.6 mg/dL   Albumin 3.0 (L) 3.5 - 5.0 g/dL   GFR calc non Af Amer 13 (L) >60 mL/min   GFR calc Af Amer 15 (L) >60 mL/min   Anion gap 10 5 - 15   Dg Chest 2 View  Result Date: 08/22/2016 CLINICAL DATA:  Weakness and vomiting for 2 weeks EXAM: CHEST  2 VIEW COMPARISON:  08/07/2016 FINDINGS: Previous median sternotomy and CABG procedure. Normal heart size. No pleural effusion or edema. Good residual changes of COPD identified. Chronic deformity involving the left humeral head noted. IMPRESSION: 1. No acute cardiopulmonary abnormalities. Electronically Signed   By: Kerby Moors M.D.   On: 08/22/2016 13:55    Assessment/Plan: Diagnosis: FTT/UTI 1. Does the need for close, 24 hr/day medical supervision in concert with the patient's rehab needs make it unreasonable for this patient to be served in a less intensive setting? No 2. Co-Morbidities requiring supervision/potential complications:   3. Due to skin/wound care, does the patient require 24 hr/day rehab nursing? No 4. Does the patient require coordinated care of a physician, rehab nurse, n/a to address physical and functional deficits in the context of the above medical diagnosis(es)? No Addressing deficits in the following areas: balance, endurance, strength and transferring 5. Can the patient actively participate in an intensive therapy program of at least 3 hrs of therapy per day at least 5 days per week? No 6. The potential for patient to make measurable  gains while on inpatient rehab is fair 7. Anticipated functional outcomes upon discharge from inpatient rehab are n/a  with PT, n/a with OT, n/a with SLP. 8. Estimated rehab length of stay to reach the above functional goals is: n/a 9. Does the patient have adequate social supports and living environment to accommodate these discharge functional goals? No 10. Anticipated D/C setting: Other 11. Anticipated post D/C treatments: N/A 12. Overall Rehab/Functional Prognosis: fair  RECOMMENDATIONS: This patient's condition is appropriate for continued rehabilitative care in the following setting: SNF vs home Patient has agreed to participate in recommended program. Potentially Note that insurance prior authorization may be required for reimbursement for recommended care.  Comment:   Meredith Staggers, MD, [facility] 08/24/2016     08/24/2016

## 2016-08-24 NOTE — Progress Notes (Signed)
OT Cancellation Note  Patient Details Name: [name] [name] MRN: [id number] DOB: Mar 15, 1941   Cancelled Treatment:    Reason Eval/Treat Not Completed:  Pt with discharge summary on chart and plans to d/c to SNF. Will defer OT to SNF.   Malka So 08/24/2016, 2:03 PM  [phone]

## 2016-08-24 NOTE — Progress Notes (Signed)
Inpatient Rehabilitation  Patient was screened by Gunnar Fusi for appropriateness for an Inpatient Acute Rehab consult.  Note that a consult order has been placed.  Plan for an admissions coordinator to follow up after the consult has been completed.    Carmelia Roller., CCC/SLP Admission Coordinator  [place]  Cell [phone]

## 2016-08-24 NOTE — Clinical Social Work Placement (Signed)
   CLINICAL SOCIAL WORK PLACEMENT  NOTE 08/24/16 - DISCHARGED TO [place]  Date:  08/24/2016  Patient Details  Name: [name] [name] MRN: [id number] Date of Birth: 12/12/1940  Clinical Social Work is seeking post-discharge placement for this patient at the [place] level of care (*CSW will initial, date and re-position this form in  chart as items are completed):  Yes   Patient/family provided with [place] Work Department's list of facilities offering this level of care within the geographic area requested by the patient (or if unable, by the patient's family).  Yes   Patient/family informed of their freedom to choose among providers that offer the needed level of care, that participate in Medicare, Medicaid or managed care program needed by the patient, have an available bed and are willing to accept the patient.  No   Patient/family informed of [place]'s ownership interest in [facility] and [facility], as well as of the fact that they are under no obligation to receive care at these facilities.  PASRR submitted to EDS on       PASRR number received on       Existing PASRR number confirmed on 08/24/16     FL2 transmitted to all facilities in geographic area requested by pt/family on 08/24/16     FL2 transmitted to all facilities within larger geographic area on       Patient informed that his/her managed care company has contracts with or will negotiate with certain facilities, including the following:            Patient/family informed of bed offers received.  Patient chooses bed at [facility]     Physician recommends and patient chooses bed at      Patient to be transferred to [facility] on 08/24/16.  Patient to be transferred to facility by Ambulance [facility])     Patient family notified on 08/24/16 of transfer.  Name of family member notified:  Wife [name] and son [name] at  the bedside     PHYSICIAN       Additional Comment:    _______________________________________________ Sable Feil, LCSW 08/24/2016, 6:24 PM

## 2016-08-24 NOTE — Progress Notes (Signed)
Rehab admissions - Please see consult done today by Dr. Naaman Plummer recommending SNF or [facility] therapies.  Call me for questions.  [phone]

## 2016-08-24 NOTE — Progress Notes (Signed)
Pharmacy Antibiotic Note  [name] [name] is a 75 y.o. male admitted on 08/22/2016 with PMH of ESRD, HTN, CAD s/p heart transplant, dementia, recent patella fracture,  multiple syncopal episodes found to have UTI.  Pharmacy has been consulted for Vancomycin dosing.  Plan: Vancomycin 1500mg  IV once today then vancomycin 750mg  IV with T,TH,Sat hemodialysis Target pre-HD level 15 - 25 mcg/mL Target post-HD level 5 - 15 mcg/mL  Height: 6\' 2"  (188 cm) Weight: 146 lb 9.7 oz (66.5 kg) IBW/kg (Calculated) : 82.2  Temp (24hrs), Avg:98.1 F (36.7 C), Min:97.8 F (36.6 C), Max:98.5 F (36.9 C)   Recent Labs Lab 08/22/16 1320 08/22/16 1750 08/22/16 2027 08/23/16 0600 08/23/16 0724 08/23/16 0725 08/24/16 0748  WBC 5.9  --  7.5 6.3 5.7  --   --   CREATININE 7.26*  --  7.51* 7.75*  --  7.77* 4.07*  LATICACIDVEN  --  1.1  --   --   --   --   --     Estimated Creatinine Clearance: 14.8 mL/min (by C-G formula based on SCr of 4.07 mg/dL (H)).   Allergies  Allergen Reactions  . Norvasc [Amlodipine Besylate] Swelling and Other (See Comments)    Swelling of the lower body   . Ace Inhibitors Other (See Comments)    On dialysis - cannot take these meds  . Angiotensin Receptor Blockers Other (See Comments)    On dialysis - cannot take these meds   Antimicrobials this admission: Clindamycin 10/3 >> 10/5 Vancomycin 10/5 >>  Dose adjustments this admission: Vancomycin  Microbiology results: 10/3 UCx: methicillin resistant CoNS   Thank you for allowing pharmacy to be a part of this patient's care.  Georga Bora, PharmD Clinical Pharmacist Pager: [phone] 08/24/2016 10:48 AM

## 2016-08-24 NOTE — Clinical Social Work Note (Signed)
Clinical Social Work Assessment  Patient Details  Name: [name] [name] MRN: [id number] Date of Birth: 1941-07-18  Date of referral:  08/24/16               Reason for consult:  Facility Placement                Permission sought to share information with:  Family Supports Nicolis Boody (Wife) [phone]) Permission granted to share information::  Yes, Verbal Permission Granted  Name::     Issaih Kaus  Agency::     Relationship::  Wife  Contact Information:  [phone]  Housing/Transportation Living arrangements for the past 2 months:  Apartment Source of Information:  Patient Patient Interpreter Needed:  None Criminal Activity/Legal Involvement Pertinent to Current Situation/Hospitalization:  No - Comment as needed Significant Relationships:  Adult Children Choua Chalker (Wife); Isidore Moos (Son); Jaheem Hedgepath (daughter-in-law)) Lives with:  Spouse Bassel Gaskill) Do you feel safe going back to the place where you live?  Yes (Patient feels safe going home, however he does understand that rehab is needed prior to going back home. ) Need for family participation in patient care:  Yes (Comment)  Care giving concerns:  Patient expressed understanding that ST rehab can benefit him and expressed that his family (wife, son and daughter-in-law) would not be available to assist him at home due to their own health issues.  Social Worker assessment / plan:  CSW talked with patient at the bedside regarding discharge planning and recommendation of ST rehab. Patient was sitting up in a chair at bedside and was alert, oriented and open to talking with CSW regarding his discharge disposition. Mr. Diekman informed CSW that he is hard of hearing so accommodations made so that patient could hear and understand what was said. CSW explained SNF search process and patient indicated that his preference is [facility] as he has been there before and liked it. CSW met patient's wife [name] and son  [name] later today and they are also in agreement with ST rehab and the selection of [facility].  Employment status:  Retired Insurance underwriter information:  Programmer, applications PT Recommendations:  [place] / Referral to community resources:  [place] (List given to patient for [place] located in [place])  Patient/Family's Response to care:  No concerns expressed regarding the care received during hospitalization.  Patient/Family's Understanding of and Emotional Response to Diagnosis, Current Treatment, and Prognosis:  Not discussed.  Emotional Assessment Appearance:  Appears stated age Attitude/Demeanor/Rapport:  Other (Pleasant) Affect (typically observed):  Adaptable, Appropriate, Pleasant Orientation:  Oriented to Self, Oriented to Place, Oriented to  Time, Oriented to Situation Alcohol / Substance use:  Never Used Psych involvement (Current and /or in the community):  No (Comment)  Discharge Needs  Concerns to be addressed:  Discharge Planning Concerns Readmission within the last 30 days:  Yes Current discharge risk:  Dependent with Mobility Barriers to Discharge:  [place], Brienne Liguori Bradley, LCSW 08/24/2016, 6:15 PM

## 2016-08-24 NOTE — Discharge Summary (Signed)
PATIENT DETAILS Name: [name] [name] Age: 75 y.o. Sex: male Date of Birth: 1941/04/12 MRN: [id number]. Admitting Physician: Waldemar Dickens, MD [id number], Andree Elk, NP  Admit Date: 08/22/2016 Discharge date: 08/24/2016  Recommendations for Outpatient Follow-up:  1. Follow up with PCP in 1-2 weeks 2. Please obtain BMP/CBC in one week 3. Ensure follow-up with primary cardiologist at [facility], and at hemodialysis center at his usual schedule. 4. Consider palliative care follow-up while at SNF-for goals of care discussion with spouse/family 5. Follow blood cultures until final  Admitted From:  Home  Disposition: SNF   Home Health: No  Equipment/Devices: None  Discharge Condition: Guarded  CODE STATUS: FULL CODE  Diet recommendation:  Heart Healthy / Carb Modified  Brief Summary: See H&P, Labs, Consult and Test reports for all details in brief, patientis a 75 y.o. 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   Brief Hospital Course: Generalized weakness/failure to thrive syndrome: Etiology not clear, however thought to be multifactorial from possible recurrent UTI with advanced age/frailty/multiple comorbid medical conditions contributing as well. PT evaluation completed, recommendations are for SNF on discharge. Spoke with spouse over the phone on 10/5, she is agreeable.   UTI: Previous urine culture positive for coag-negative hence was started empirically on clindamycin, repeat urine culture again positive for coag-negative Staphylococcus. Patient does acknowledge some frequency and urgency-however is afebrile but again is on immunosuppressants. Blood cultures are negative so far. Case discussed with  infectious disease-Dr. Tommy Medal over the phone, recommendations are to treat this as an infection. Patient is on hemodialysis-we will start intravenous vancomycin while on hemodialysis. Nephrology aware-spoke with Dr. Jonnie Finner  History of recurrent falls/syncope: Secondary to orthostatic hypotension/chronic autonomic dysfunction. Unfortunately had significantly elevated blood pressure on admission-difficult situation-will need to allow some permissive hypertension given history of orthostatic/autonomic dysfunction to allow for some room for the blood pressure to drop. For now cautiously continue with metoprolol and follow and adjust accordingly  Hypertensive urgency: Suspect blood pressure will continue to improve post dialysis-see above regarding concern for orthostatic/autonomic dysfunction.  History of cardiac transplantation: Continue cyclosporine and CellCept. Followed at [facility] ensure follow-up with cardiology in the outpatient setting  ESRD: On HD TTS-nephrology follow patient closely during this hospital stay-resume hemodialysis at previous schedule on discharge.  Type 2 diabetes: Diet controlled-monitor CBGs periodically  Anemia: Secondary to chronic kidney disease, darbepoetin being dosed by nephrology.  Hypothyroidism: Continue Synthroid  Depression: Continue Prozac  Recent left patella  fracture: Patient is a good few weeks back-he is following with orthopedics-he has a brace brace in place in his left knee. He has been ambulating with this brace. Spoke with Dr. Edmonia Lynch over the phone, okay to bear weight without any restrictions. Follow with Dr. Percell Miller as needed.rge.  Dementia: Continue Aricept-was mostly awake and alert during my evaluation this morning  Intermittent diarrhea: Per spouse-patient has had intermittent diarrhea for the past few years-C. difficile PCR was negative. Continue as needed Lomotil.  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  Get Medicines reviewed and adjusted: Please take all your medications with you for your next visit with your Primary MD  Laboratory/radiological data: Please request your Primary MD to go over all hospital tests and procedure/radiological results at the follow up, please ask your Primary MD to get all Hospital records sent to his/her office.  In some cases, they will be blood work, cultures and biopsy results pending at the time of your discharge. Please request that your primary care M.D. follows up on these results.  Also Note the following: If you experience worsening of your admission symptoms, develop shortness of breath, life threatening emergency, suicidal or homicidal thoughts you must seek medical attention immediately by calling 911 or calling your MD immediately  if symptoms less severe.  You must read complete instructions/literature along with all the possible adverse reactions/side effects for all the Medicines you take and that have been prescribed to  you. Take any new Medicines after you have completely understood and accpet all the possible adverse reactions/side effects.   Do not drive when taking Pain medications or sleeping medications (Benzodaizepines)  Do not take more than prescribed Pain, Sleep and Anxiety Medications. It is not advisable to combine anxiety,sleep and pain medications without talking with your primary care practitioner  Special Instructions: If you have smoked or chewed Tobacco  in the last 2 yrs please stop smoking, stop any regular Alcohol  and or any Recreational drug use.  Wear Seat belts while driving.  Please note: You were cared for by a hospitalist during your hospital stay. Once you are discharged, your primary care physician will handle any further medical issues. Please note that NO REFILLS for any discharge medications will be authorized once you are discharged, as it is imperative that you return to your primary care physician (or establish a relationship with a primary care physician if you do not have one) for your post hospital discharge needs so that they can reassess your need for medications and monitor your lab values.   Increase activity slowly    Complete by:  As directed        Medication List    STOP taking these medications   oxyCODONE-acetaminophen 5-325 MG tablet Commonly known as:  PERCOCET     TAKE these medications   acetaminophen 325 MG tablet Commonly known as:  TYLENOL Take 2 tablets (650 mg total) by mouth every 6 (six) hours as needed for mild pain (or Fever >/= 101). What changed:  when to take this   aspirin EC 81 MG tablet Take 81 mg by mouth at bedtime.   calcium acetate 667 MG capsule Commonly known as:  PHOSLO Take 1 capsule (667 mg total) by mouth 3 (three) times daily with meals.   cholecalciferol 1000 units tablet Commonly known as:  VITAMIN D  Take 1,000 Units by mouth daily.   cycloSPORINE modified 25 MG capsule Commonly known as:  NEORAL Take 50 mg by  mouth 2 (two) times daily.   diphenoxylate-atropine 2.5-0.025 MG tablet Commonly known as:  LOMOTIL Take 1 tablet by mouth 4 (four) times daily as needed for diarrhea or loose stools. What changed:  when to take this   donepezil 10 MG tablet Commonly known as:  ARICEPT Take 10 mg by mouth at bedtime.   FLUoxetine 20 MG capsule Commonly known as:  PROZAC Take 20 mg by mouth daily.   fluticasone 50 MCG/ACT nasal spray Commonly known as:  FLONASE Place 2 sprays into both nostrils at bedtime as needed for allergies.   lansoprazole 30 MG capsule Commonly known as:  PREVACID Take 30 mg by mouth See admin instructions. Take 1 capsule (30 mg) by mouth twice daily on Sunday, Monday, Wednesday, Friday (non-dialysis days)   levothyroxine 75 MCG tablet Commonly known as:  SYNTHROID, LEVOTHROID Take 75 mcg by mouth daily.   metoprolol tartrate 25 MG tablet Commonly known as:  LOPRESSOR Take 2 tablets (50 mg total) by mouth 2 (two) times daily. What changed:  how much to take  when to take this  additional instructions   multivitamin Tabs tablet Take 1 tablet by mouth daily.   mycophenolate 250 MG capsule Commonly known as:  CELLCEPT Take 250-500 mg by mouth See admin instructions. Take 500 mg by mouth in the morning and take 250 mg by mouth in the evening. On Tuesday, Thursday and Saturday only takes 500 mg by mouth in the evening.   pravastatin 10 MG tablet Commonly known as:  PRAVACHOL Take 10 mg by mouth at bedtime.   PRO-BIOTIC BLEND Caps Take 1 capsule by mouth daily.   Vancomycin 750-5 MG/150ML-% Soln Commonly known as:  VANCOCIN Inject 150 mLs (750 mg total) into the vein Every Tuesday,Thursday,and Saturday with dialysis. 10 days from 10/5 Start taking on:  08/26/2016   vitamin B-12 1000 MCG tablet Commonly known as:  CYANOCOBALAMIN Take 1,000 mcg by mouth daily with breakfast.      Follow-up Information    Smothers, Andree Elk, NP. Schedule an appointment as  soon as possible for a visit in 2 week(s).   Specialty:  Nurse Practitioner Contact information: [address] [place] [address] [phone]        Hemodialysis center usual schedule-on-Tuesdays,Thursdays and Saturdays .          Allergies  Allergen Reactions  . Norvasc [Amlodipine Besylate] Swelling and Other (See Comments)    Swelling of the lower body   . Ace Inhibitors Other (See Comments)    On dialysis - cannot take these meds  . Angiotensin Receptor Blockers Other (See Comments)    On dialysis - cannot take these meds    Consultations:   nephrology  Other Procedures/Studies: Dg Chest 2 View  Result Date: 08/22/2016 CLINICAL DATA:  Weakness and vomiting for 2 weeks EXAM: CHEST  2 VIEW COMPARISON:  08/07/2016 FINDINGS: Previous median sternotomy and CABG procedure. Normal heart size. No pleural effusion or edema. Good residual changes of COPD identified. Chronic deformity involving the left humeral head noted. IMPRESSION: 1. No acute cardiopulmonary abnormalities. Electronically Signed   By: Kerby Moors M.D.   On: 08/22/2016 13:55   Dg Chest 2 View  Result Date: 08/07/2016 CLINICAL DATA:  No chest pain or SOB Evaluate for changes. Diagnosed with fractured patella today EXAM: CHEST  2 VIEW COMPARISON:  Radiographs 04/22/2015 and 05/04/2016. FINDINGS: The heart size and mediastinal contours are stable status post CABG. Right IJ central venous catheter has been removed. There is chronic scarring at the right lung base. The left lung is clear. There is no edema, pleural effusion or pneumothorax. Vascular surgical clips are present in the left upper arm. There is an old healed fracture of the proximal left humerus. IMPRESSION: Stable chest.  No acute cardiopulmonary process. Electronically Signed   By: Richardean Sale M.D.   On: 08/07/2016 16:01   Mr Brain Wo Contrast  Result Date: 08/09/2016 CLINICAL DATA:  75 year old male with syncope. Dialysis  patient. Hypertensive, systolic [id number]. Initial encounter. EXAM: MRI HEAD WITHOUT CONTRAST TECHNIQUE: Multiplanar, multiecho pulse sequences of the brain and surrounding structures were obtained without intravenous contrast. COMPARISON:  Head and cervical spine CT 06/27/2016 and earlier. 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 Patchy and confluent cerebral white matter T2 and FLAIR hyperintensity which in places resembles chronic white matter lacunar infarcts. Moderate T2 heterogeneity in the deep gray matter nuclei with a chronic lacunar infarcts suspected in the right caudate. Fairly numerous scattered chronic micro hemorrhages in the brainstem, cerebellum, and both cerebral hemispheres. The largest areas of hemosiderin are in the right basal ganglia and right cerebellum. 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 No restricted diffusion or evidence of acute infarction. Previous right frontotemporal craniotomy. No midline shift, mass effect, evidence of mass lesion, ventriculomegaly, extra-axial collection or acute intracranial hemorrhage. Cervicomedullary junction and pituitary are within normal limits. Vascular: Major intracranial vascular flow voids are preserved. Skull and upper cervical spine: Negative. Normal bone marrow signal. Sinuses/Orbits: Postoperative changes to both globes. No acute orbit soft tissue findings. Trace paranasal sinus mucosal thickening. Mild left mastoid effusion. Negative nasopharynx ; tiny right side retention cyst (series 5, image 5). Other: Negative scalp soft tissues. IMPRESSION: 1.  No acute intracranial abnormality. 2. Advanced chronic small vessel disease, including numerous chronic micro hemorrhages in the brain. 3. Previous right frontotemporal craniotomy. 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 Electronically Signed   By: Genevie Ann M.D.    On: 08/09/2016 12:43      TODAY-DAY OF DISCHARGE:  Subjective:   [name] [name] today has no headache,no chest abdominal pain,no new weakness tingling or numbness, feels much better wants to go home today.   Objective:   Blood pressure (!) 158/61, pulse 66, temperature 97.9 F (36.6 C), temperature source Oral, resp. rate 18, height 6\' 2"  (1.88 m), weight 66.5 kg (146 lb 9.7 oz), SpO2 100 %.  Intake/Output Summary (Last 24 hours) at 08/24/16 1317 Last data filed at 08/24/16 1302  Gross per 24 hour  Intake              240 ml  Output                0 ml  Net              240 ml   Filed Weights   08/23/16 2047 08/24/16 0720 08/24/16 1120  Weight: 67.9 kg (149 lb 11.1 oz) 66.5 kg (146 lb 9.7 oz) 66.5 kg (146 lb 9.7 oz)    Exam: Awake Alert, Oriented *3, No new F.N deficits, Normal affect [place].AT,PERRAL Supple Neck,No JVD, No cervical lymphadenopathy appriciated.  Symmetrical Chest wall movement, Good air movement bilaterally, CTAB RRR,No Gallops,Rubs or new Murmurs, No Parasternal Heave +  ve B.Sounds, Abd Soft, Non tender, No organomegaly appriciated, No rebound -guarding or rigidity. No Cyanosis, Clubbing or edema, No new Rash or bruise   PERTINENT RADIOLOGIC STUDIES: Dg Chest 2 View  Result Date: 08/22/2016 CLINICAL DATA:  Weakness and vomiting for 2 weeks EXAM: CHEST  2 VIEW COMPARISON:  08/07/2016 FINDINGS: Previous median sternotomy and CABG procedure. Normal heart size. No pleural effusion or edema. Good residual changes of COPD identified. Chronic deformity involving the left humeral head noted. IMPRESSION: 1. No acute cardiopulmonary abnormalities. Electronically Signed   By: Kerby Moors M.D.   On: 08/22/2016 13:55   Dg Chest 2 View  Result Date: 08/07/2016 CLINICAL DATA:  No chest pain or SOB Evaluate for changes. Diagnosed with fractured patella today EXAM: CHEST  2 VIEW COMPARISON:  Radiographs 04/22/2015 and 05/04/2016. FINDINGS: The heart size and mediastinal  contours are stable status post CABG. Right IJ central venous catheter has been removed. There is chronic scarring at the right lung base. The left lung is clear. There is no edema, pleural effusion or pneumothorax. Vascular surgical clips are present in the left upper arm. There is an old healed fracture of the proximal left humerus. IMPRESSION: Stable chest.  No acute cardiopulmonary process. Electronically Signed   By: Richardean Sale M.D.   On: 08/07/2016 16:01   Mr Brain Wo Contrast  Result Date: 08/09/2016 CLINICAL DATA:  75 year old male with syncope. Dialysis patient. Hypertensive, systolic [id number]. Initial encounter. EXAM: MRI HEAD WITHOUT CONTRAST TECHNIQUE: Multiplanar, multiecho pulse sequences of the brain and surrounding structures were obtained without intravenous contrast. COMPARISON:  Head and cervical spine CT 06/27/2016 and earlier. 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 Patchy and confluent cerebral white matter T2 and FLAIR hyperintensity which in places resembles chronic white matter lacunar infarcts. Moderate T2 heterogeneity in the deep gray matter nuclei with a chronic lacunar infarcts suspected in the right caudate. Fairly numerous scattered chronic micro hemorrhages in the brainstem, cerebellum, and both cerebral hemispheres. The largest areas of hemosiderin are in the right basal ganglia and right cerebellum. 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 No restricted diffusion or evidence of acute infarction. Previous right frontotemporal craniotomy. No midline shift, mass effect, evidence of mass lesion, ventriculomegaly, extra-axial collection or acute intracranial hemorrhage. Cervicomedullary junction and pituitary are within normal limits. Vascular: Major intracranial vascular flow voids are preserved. Skull and upper cervical spine: Negative. Normal bone marrow signal.  Sinuses/Orbits: Postoperative changes to both globes. No acute orbit soft tissue findings. Trace paranasal sinus mucosal thickening. Mild left mastoid effusion. Negative nasopharynx ; tiny right side retention cyst (series 5, image 5). Other: Negative scalp soft tissues. IMPRESSION: 1.  No acute intracranial abnormality. 2. Advanced chronic small vessel disease, including numerous chronic micro hemorrhages in the brain. 3. Previous right frontotemporal craniotomy. 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 Electronically Signed   By: Genevie Ann M.D.   On: 08/09/2016 12:43     PERTINENT LAB RESULTS: CBC:  Recent Labs  08/23/16 0600 08/23/16 0724  WBC 6.3 5.7  HGB 10.2* 9.9*  HCT 33.4* 31.8*  PLT 247 224   CMET CMP     Component Value Date/Time   NA 136 08/24/2016 0748   K 3.8 08/24/2016 0748   CL 99 (L) 08/24/2016 0748   CO2 27 08/24/2016 0748   GLUCOSE 80 08/24/2016 0748   BUN 19 08/24/2016 0748  CREATININE 4.07 (H) 08/24/2016 0748   CALCIUM 8.4 (L) 08/24/2016 0748   PROT 5.7 (L) 08/22/2016 1320   ALBUMIN 3.0 (L) 08/24/2016 0748   AST 20 08/22/2016 1320   ALT 16 (L) 08/22/2016 1320   ALKPHOS 93 08/22/2016 1320   BILITOT 0.7 08/22/2016 1320   GFRNONAA 13 (L) 08/24/2016 0748   GFRAA 15 (L) 08/24/2016 0748    GFR Estimated Creatinine Clearance: 14.8 mL/min (by C-G formula based on SCr of 4.07 mg/dL (H)). No results for input(s): LIPASE, AMYLASE in the last 72 hours.  Recent Labs  08/22/16 1320  CKTOTAL 37*   Invalid input(s): POCBNP No results for input(s): DDIMER in the last 72 hours. No results for input(s): HGBA1C in the last 72 hours. No results for input(s): CHOL, HDL, LDLCALC, TRIG, CHOLHDL, LDLDIRECT in the last 72 hours. No results for input(s): TSH, T4TOTAL, T3FREE, THYROIDAB in the last 72 hours.  Invalid input(s): FREET3 No results for input(s): VITAMINB12, FOLATE, FERRITIN, TIBC, IRON, RETICCTPCT in the last 72  hours. Coags: No results for input(s): INR in the last 72 hours.  Invalid input(s): PT Microbiology: Recent Results (from the past 240 hour(s))  Culture, blood (Routine X 2) w Reflex to ID Panel     Status: None (Preliminary result)   Collection Time: 08/22/16  5:50 PM  Result Value Ref Range Status   Specimen Description BLOOD RIGHT HAND  Final   Special Requests IN PEDIATRIC BOTTLE 3 ML  Final   Culture NO GROWTH < 24 HOURS  Final   Report Status PENDING  Incomplete  Urine culture     Status: Abnormal   Collection Time: 08/22/16  5:51 PM  Result Value Ref Range Status   Specimen Description URINE, CATHETERIZED  Final   Special Requests NONE  Final   Culture (A)  Final    >=100,000 COLONIES/mL STAPHYLOCOCCUS SPECIES (COAGULASE NEGATIVE)   Report Status 08/24/2016 FINAL  Final   Organism ID, Bacteria STAPHYLOCOCCUS SPECIES (COAGULASE NEGATIVE) (A)  Final      Susceptibility   Staphylococcus species (coagulase negative) - MIC*    CIPROFLOXACIN >=8 RESISTANT Resistant     GENTAMICIN <=0.5 SENSITIVE Sensitive     NITROFURANTOIN <=16 SENSITIVE Sensitive     OXACILLIN >=4 RESISTANT Resistant     TETRACYCLINE 2 SENSITIVE Sensitive     VANCOMYCIN 1 SENSITIVE Sensitive     TRIMETH/SULFA <=10 SENSITIVE Sensitive     CLINDAMYCIN <=0.25 SENSITIVE Sensitive     RIFAMPIN <=0.5 SENSITIVE Sensitive     Inducible Clindamycin NEGATIVE Sensitive     * >=100,000 COLONIES/mL STAPHYLOCOCCUS SPECIES (COAGULASE NEGATIVE)  Culture, blood (Routine X 2) w Reflex to ID Panel     Status: None (Preliminary result)   Collection Time: 08/22/16  6:05 PM  Result Value Ref Range Status   Specimen Description BLOOD RIGHT ANTECUBITAL  Final   Special Requests BOTTLES DRAWN AEROBIC AND ANAEROBIC 5 ML  Final   Culture NO GROWTH < 24 HOURS  Final   Report Status PENDING  Incomplete  MRSA PCR Screening     Status: None   Collection Time: 08/23/16  6:35 AM  Result Value Ref Range Status   MRSA by PCR NEGATIVE  NEGATIVE Final    Comment:        The GeneXpert MRSA Assay (FDA approved for NASAL specimens only), is one component of a comprehensive MRSA colonization surveillance program. It is not intended to diagnose MRSA infection nor to guide or monitor treatment for MRSA infections.  C difficile quick scan w PCR reflex     Status: None   Collection Time: 08/23/16 10:37 PM  Result Value Ref Range Status   C Diff antigen NEGATIVE NEGATIVE Final   C Diff toxin NEGATIVE NEGATIVE Final   C Diff interpretation No C. difficile detected.  Final    FURTHER DISCHARGE INSTRUCTIONS:  Get Medicines reviewed and adjusted: Please take all your medications with you for your next visit with your Primary MD  Laboratory/radiological data: Please request your Primary MD to go over all hospital tests and procedure/radiological results at the follow up, please ask your Primary MD to get all Hospital records sent to his/her office.  In some cases, they will be blood work, cultures and biopsy results pending at the time of your discharge. Please request that your primary care M.D. goes through all the records of your hospital data and follows up on these results.  Also Note the following: If you experience worsening of your admission symptoms, develop shortness of breath, life threatening emergency, suicidal or homicidal thoughts you must seek medical attention immediately by calling 911 or calling your MD immediately  if symptoms less severe.  You must read complete instructions/literature along with all the possible adverse reactions/side effects for all the Medicines you take and that have been prescribed to you. Take any new Medicines after you have completely understood and accpet all the possible adverse reactions/side effects.   Do not drive when taking Pain medications or sleeping medications (Benzodaizepines)  Do not take more than prescribed Pain, Sleep and Anxiety Medications. It is not advisable  to combine anxiety,sleep and pain medications without talking with your primary care practitioner  Special Instructions: If you have smoked or chewed Tobacco  in the last 2 yrs please stop smoking, stop any regular Alcohol  and or any Recreational drug use.  Wear Seat belts while driving.  Please note: You were cared for by a hospitalist during your hospital stay. Once you are discharged, your primary care physician will handle any further medical issues. Please note that NO REFILLS for any discharge medications will be authorized once you are discharged, as it is imperative that you return to your primary care physician (or establish a relationship with a primary care physician if you do not have one) for your post hospital discharge needs so that they can reassess your need for medications and monitor your lab values.  Total Time spent coordinating discharge including counseling, education and face to face time equals 45  minutes.  SignedOren [place] 08/24/2016 1:17 PM

## 2016-08-27 LAB — CULTURE, BLOOD (ROUTINE X 2)
CULTURE: NO GROWTH
Culture: NO GROWTH

## 2016-09-02 ENCOUNTER — Encounter (HOSPITAL_COMMUNITY): Payer: Self-pay | Admitting: Emergency Medicine

## 2016-09-02 ENCOUNTER — Emergency Department (HOSPITAL_COMMUNITY)
Admission: EM | Admit: 2016-09-02 | Discharge: 2016-09-02 | Disposition: A | Discharge status: Home / Self Care | Payer: Medicare HMO | Attending: Emergency Medicine | Admitting: Emergency Medicine

## 2016-09-02 DIAGNOSIS — Z951 Presence of aortocoronary bypass graft: Secondary | ICD-10-CM | POA: Insufficient documentation

## 2016-09-02 DIAGNOSIS — N186 End stage renal disease: Secondary | ICD-10-CM | POA: Diagnosis not present

## 2016-09-02 DIAGNOSIS — Z7982 Long term (current) use of aspirin: Secondary | ICD-10-CM | POA: Diagnosis not present

## 2016-09-02 DIAGNOSIS — E039 Hypothyroidism, unspecified: Secondary | ICD-10-CM | POA: Insufficient documentation

## 2016-09-02 DIAGNOSIS — T82838A Hemorrhage of vascular prosthetic devices, implants and grafts, initial encounter: Secondary | ICD-10-CM

## 2016-09-02 DIAGNOSIS — Y69 Unspecified misadventure during surgical and medical care: Secondary | ICD-10-CM | POA: Insufficient documentation

## 2016-09-02 DIAGNOSIS — E119 Type 2 diabetes mellitus without complications: Secondary | ICD-10-CM | POA: Insufficient documentation

## 2016-09-02 DIAGNOSIS — I120 Hypertensive chronic kidney disease with stage 5 chronic kidney disease or end stage renal disease: Secondary | ICD-10-CM | POA: Diagnosis not present

## 2016-09-02 DIAGNOSIS — I2510 Atherosclerotic heart disease of native coronary artery without angina pectoris: Secondary | ICD-10-CM | POA: Diagnosis not present

## 2016-09-02 MED ORDER — LIDOCAINE-EPINEPHRINE 1 %-1:100000 IJ SOLN
20.0000 mL | Freq: Once | INTRAMUSCULAR | Status: AC
  Administered 2016-09-02: 20 mL
  Filled 2016-09-02: qty 1

## 2016-09-02 NOTE — ED Notes (Signed)
Awaiting PTAR. 

## 2016-09-02 NOTE — ED Notes (Signed)
Family at bedside. 

## 2016-09-02 NOTE — ED Notes (Signed)
Patient is alert and orientedx4.  Patient was explained discharge instructions and they understood them with no questions.   

## 2016-09-02 NOTE — ED Provider Notes (Signed)
[facility] DEPT Provider Note   CSN: [id number] Arrival date & time: 09/02/16  2036     History   Chief Complaint Chief Complaint  Patient presents with  . Coagulation Disorder    Patient comes from [facility].  The patient is a dialysis patient, had dialysis today.  The fistula in his upper left arm had been bleeding so they bandaged it in dialysis. He took the bandage off at his home which is [facility] and it began to bleed again.    HPI [name] [name] is a 75 y.o. male.  HPI Patient presents to the emergency department with bleeding from his left upper arm AV fistula.  He had dialysis today and afterwards she began having bleeding from the dialysis access site.  This has been temporarily controlled by gauze and pressure dressing.  However on my arrival the patient began bleeding again.  Direct pressure applied.  No other recent illness.  No other complaints.  No lightheadedness.  No syncope.     Past Medical History:  Diagnosis Date  . Anemia   . Cancer ([place])    SKIN  . CKD III-IV    a. since transplant in 2001 with significant worsening since 06/2013.  Marland Kitchen Coronary artery disease   . Dementia    mild  . Diabetes mellitus without complication (HCC)    FASTING CBG 90S  . Headache(784.0)   . 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 1995 CABG x 4 in [place], [place];  b. 2001 Developed CHF;  c. 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 10/2000 Cardiac transplant @ [facility], [place];  e. Historically nl Bx and stress testing.  Last stress test ~ 5 yrs ago.  Marland Kitchen Hernia of abdominal wall   . History of blood transfusion   . History of stroke   . Hypercalcemia    a. 06/2013 Hospitalized in [place].  Marland Kitchen Hyperlipidemia   . Hypertension    a. Long h/o HTN, came off of all meds following transplant/wt loss-->meds resumed 06/2013, difficult to control since.  . Myocardial infarction    PRIOR TO HEART TRANSPLANT  . Orthostatic dizziness   . Orthostatic hypotension    a. Has tried support hose  and compression stockings - prefers not to wear.  . Shortness of breath    EXERTION  . Stroke ([place]) 06/2013   NO RESIDUAL - WAS CONFUSED AT TIME - THAT HAS RESOLVED    Patient Active Problem List   Diagnosis Date Noted  . Generalized weakness 08/22/2016  . Urinary tract infection, site not specified   . Syncope and collapse   . Syncope 08/07/2016  . Faintness   . Uncontrolled hypertension   . Acute blood loss anemia   . Cardiomyopathy ([place])   . Controlled diabetes mellitus type 2 with complications ([place])   . 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 Traumatic subdural hematoma ([place]) 05/04/2016  . 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 Subdural hematoma ([place]) 05/02/2016  . CKD (chronic kidney disease)   . Protein-calorie malnutrition, severe ([place]) 04/23/2015  . Anemia of chronic disease 04/23/2015  . Depression 04/23/2015  . UTI (urinary tract infection) 04/22/2015  . Barrett's esophagus 03/22/2015  . 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 DM (diabetes mellitus), type 2 with renal complications ([place]) [id number]  . Hypothyroidism 10/06/2014  . 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 Hypercholesterolemia 06/20/2014  . 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 BASCILIC VEIN TRANSPOSITION Left 07/06/2014   Procedure: [place];  Surgeon: Rosetta Posner, MD;  Location: [facility];  Service: Vascular;  Laterality: Left;  . CARDIAC SURGERY    . CATARACT EXTRACTION    . CORONARY ARTERY BYPASS GRAFT    . CRANIOTOMY Right 05/04/2016   Procedure: Right Craniotomy for Evacuation of Subdural Hematoma;  Surgeon: Earnie Larsson, MD;  Location: [facility] NEURO [facility]S;  Service: Neurosurgery;  Laterality: Right;  . EYE SURGERY Bilateral    CATARACTS  . HEART TRANSPLANT    . SKIN GRAFT FULL THICKNESS LEG     cat scratch that did not heal  . VASECTOMY         Home Medications    Prior to  Admission medications   Medication Sig Start Date End Date Taking? Authorizing Provider  acetaminophen (TYLENOL) 325 MG tablet Take 2 tablets (650 mg total) by mouth every 6 (six) hours as needed for mild pain (or Fever >/= 101). Patient taking differently: Take 650 mg by mouth 2 (two) times daily.  10/14/14   Delfina Redwood, MD  aspirin EC 81 MG tablet Take 81 mg by mouth at bedtime.    Historical Provider, MD  calcium acetate (PHOSLO) 667 MG capsule Take 1 capsule (667 mg total) by mouth 3 (three) times daily with meals. 08/24/16   Shanker Kristeen Mans, MD  cholecalciferol (VITAMIN D) 1000 UNITS tablet Take 1,000 Units by mouth daily.    Historical Provider, MD  cycloSPORINE modified (NEORAL) 25 MG capsule Take 50 mg by mouth 2 (two) times daily.  08/18/14   Lelon Perla, MD  diphenoxylate-atropine (LOMOTIL) 2.5-0.025 MG tablet Take 1 tablet by mouth 4 (four) times daily as needed for diarrhea or loose stools. 08/24/16   Shanker Kristeen Mans, MD  donepezil (ARICEPT) 10 MG tablet Take 10 mg by mouth at bedtime.  03/01/15   Historical Provider, MD  FLUoxetine (PROZAC) 20 MG capsule Take 20 mg by mouth daily.     Historical Provider, MD  fluticasone (FLONASE) 50 MCG/ACT nasal spray Place 2 sprays into both nostrils at bedtime as needed for allergies.  04/12/16   Historical Provider, MD  lansoprazole (PREVACID) 30 MG capsule Take 30 mg by mouth See admin instructions. Take 1 capsule (30 mg) by mouth twice daily on Sunday, Monday, Wednesday, Friday (non-dialysis days) 12/22/15   Historical Provider, MD  levothyroxine (SYNTHROID, LEVOTHROID) 75 MCG tablet Take 75 mcg by mouth daily.     Historical Provider, MD  metoprolol tartrate (LOPRESSOR) 25 MG tablet Take 2 tablets (50 mg total) by mouth 2 (two) times daily. Patient taking differently: Take 75 mg by mouth See admin instructions. Take 75 mg by mouth daily on Sunday, Monday, Wednesday and Friday. Do not take on dialysis days. 12/27/14   Ripudeep Krystal Eaton, MD    multivitamin (RENA-VIT) TABS tablet Take 1 tablet by mouth daily.  03/08/15   Historical Provider, MD  mycophenolate (CELLCEPT) 250 MG capsule Take 250-500 mg by mouth See admin instructions. Take 500 mg by mouth in the morning and take 250 mg by mouth in the evening. On Tuesday, Thursday and Saturday only takes 500 mg by mouth in the evening.    Historical Provider, MD  pravastatin (PRAVACHOL) 10 MG tablet Take 10 mg by mouth at bedtime.     Historical Provider, MD  Probiotic Product (PRO-BIOTIC BLEND) CAPS Take 1 capsule by mouth daily.    Historical Provider, MD  Vancomycin Zollie Pee)  750-5 MG/150ML-% SOLN Inject 150 mLs (750 mg total) into the vein Every Tuesday,Thursday,and Saturday with dialysis. 10 days from 10/5 08/26/16   Shanker Kristeen Mans, MD  vitamin B-12 (CYANOCOBALAMIN) 1000 MCG tablet Take 1,000 mcg by mouth daily with breakfast.    Historical Provider, MD    Family History Family History  Problem Relation Age of Onset  . Heart attack Father     died @ 16  . Diabetes Father   . Heart attack Mother   . Diabetes Mother   . Kidney disease Maternal Aunt     Social History Social History  Substance Use Topics  . Smoking status: Never Smoker  . Smokeless tobacco: Never Used  . Alcohol use No     Allergies   Norvasc [amlodipine besylate]; Ace inhibitors; and Angiotensin receptor blockers   Review of Systems Review of Systems  All other systems reviewed and are negative.    Physical Exam Updated Vital Signs BP 147/71   Pulse 67   Temp 97.5 F (36.4 C) (Oral)   Resp 26   Ht 6\' 2"  (1.88 m)   Wt 145 lb (65.8 kg)   SpO2 100%   BMI 18.62 kg/m   Physical Exam  Constitutional: He is oriented to person, place, and time. He appears well-developed and well-nourished.  HENT:  Head: Normocephalic.  Eyes: EOM are normal.  Neck: Normal range of motion.  Pulmonary/Chest: Effort normal.  Abdominal: He exhibits no distension.  Musculoskeletal:  Pulsatile bleeding  noted from the dialysis access side of his left upper extremity AV fistula.  Neurological: He is alert and oriented to person, place, and time.  Psychiatric: He has a normal mood and affect.  Nursing note and vitals reviewed.    ED Treatments / Results  Labs (all labs ordered are listed, but only abnormal results are displayed) Labs Reviewed - No data to display  EKG  EKG Interpretation None       Radiology No results found.  Procedures Procedures (including critical care time)  Medications Ordered in ED    Initial Impression / Assessment and Plan / ED Course  I have reviewed the triage vital signs and the nursing notes.  Pertinent labs & imaging results that were available during my care of the patient were reviewed by me and considered in my medical decision making (see chart for details).  Clinical Course    Bleeding eventually controlled with direct pressure and proximal control.  This area did not bleed for approximately 1 minute which point a thick layer of Dermabond was applied over the top of this.  Patient was observed for another 30 minutes in the emergency department with no recurrent bleeding.  Patient be discharged home at this time.  He understands to return for recurrent bleeding.  Vital signs normal  Final Clinical Impressions(s) / ED Diagnoses   Final diagnoses:  Bleeding from dialysis shunt, initial encounter [facility])    New Prescriptions New Prescriptions   No medications on file     Jola Schmidt, MD 09/02/16 2307

## 2016-09-02 NOTE — ED Triage Notes (Signed)
Patient comes from [facility].  The patient is a dialysis patient, had dialysis today.  The fistula in his upper left arm had been bleeding so they bandaged it in dialysis. He took the bandage off at his home which is [facility] and it began to bleed again.  Staff at [facility] bandaged the site and called EMS.  Bleeding is controlled.  Patient denies dizziness, weakness or pain anywhere.

## 2016-09-04 ENCOUNTER — Telehealth: Payer: Self-pay

## 2016-09-04 ENCOUNTER — Other Ambulatory Visit: Payer: Self-pay

## 2016-09-04 NOTE — Telephone Encounter (Signed)
ll Phone call from pt's wife.  Reported the pt. Had a recent infiltration of left arm AVF at dialysis.  Then, the pt. had bleeding from the site post dialysis.  Due to difficulty getting the bleeding to stop, the pt. Went to the ER.  Reported the ER had problems getting the bleeding to stop, and applied direct pressure and used a glue to keep the bleeding from reoccurring.  Stated he resides at [facility], and dialyzes T-Th-Sat.  Discussed with Dr. Trula Slade.  Advised to schedule pt. for a Fistulogram with poss. Intervention.  Notified pt's wife of plan.  Agreed.

## 2016-09-04 NOTE — Telephone Encounter (Signed)
Spoke with Stephenie Acres, nurse at [facility] re: plan for Fistulogram on Wed., 10/18.  Will fax instructions to [facility].  Advised the pt. needs to report at 9:30 AM. To the Admitting Dept.  [name] made aware of arrangements with [facility].  Agrees with plan.

## 2016-09-04 NOTE — Telephone Encounter (Signed)
Phone call to [facility] to try to arrange transportation for procedure 10/18;  To return call.

## 2016-09-06 ENCOUNTER — Encounter (HOSPITAL_COMMUNITY): Payer: Self-pay | Admitting: Vascular Surgery

## 2016-09-06 ENCOUNTER — Ambulatory Visit (HOSPITAL_COMMUNITY)
Admission: RE | Admit: 2016-09-06 | Discharge: 2016-09-06 | Disposition: A | Discharge status: Home / Self Care | Payer: Medicare HMO | Source: Ambulatory Visit | Attending: Vascular Surgery | Admitting: Vascular Surgery

## 2016-09-06 ENCOUNTER — Encounter (HOSPITAL_COMMUNITY)
Admission: RE | Disposition: A | Discharge status: Home / Self Care | Payer: Self-pay | Source: Ambulatory Visit | Attending: Vascular Surgery

## 2016-09-06 DIAGNOSIS — Z833 Family history of diabetes mellitus: Secondary | ICD-10-CM | POA: Insufficient documentation

## 2016-09-06 DIAGNOSIS — E785 Hyperlipidemia, unspecified: Secondary | ICD-10-CM | POA: Diagnosis not present

## 2016-09-06 DIAGNOSIS — N185 Chronic kidney disease, stage 5: Secondary | ICD-10-CM

## 2016-09-06 DIAGNOSIS — Z7982 Long term (current) use of aspirin: Secondary | ICD-10-CM | POA: Insufficient documentation

## 2016-09-06 DIAGNOSIS — Z8249 Family history of ischemic heart disease and other diseases of the circulatory system: Secondary | ICD-10-CM | POA: Diagnosis not present

## 2016-09-06 DIAGNOSIS — I871 Compression of vein: Secondary | ICD-10-CM | POA: Insufficient documentation

## 2016-09-06 DIAGNOSIS — T82858A Stenosis of vascular prosthetic devices, implants and grafts, initial encounter: Secondary | ICD-10-CM | POA: Insufficient documentation

## 2016-09-06 DIAGNOSIS — I252 Old myocardial infarction: Secondary | ICD-10-CM | POA: Insufficient documentation

## 2016-09-06 DIAGNOSIS — Z8673 Personal history of transient ischemic attack (TIA), and cerebral infarction without residual deficits: Secondary | ICD-10-CM | POA: Insufficient documentation

## 2016-09-06 DIAGNOSIS — N186 End stage renal disease: Secondary | ICD-10-CM | POA: Diagnosis not present

## 2016-09-06 DIAGNOSIS — E1122 Type 2 diabetes mellitus with diabetic chronic kidney disease: Secondary | ICD-10-CM | POA: Insufficient documentation

## 2016-09-06 DIAGNOSIS — Z992 Dependence on renal dialysis: Secondary | ICD-10-CM | POA: Diagnosis not present

## 2016-09-06 DIAGNOSIS — D631 Anemia in chronic kidney disease: Secondary | ICD-10-CM | POA: Insufficient documentation

## 2016-09-06 DIAGNOSIS — Y832 Surgical operation with anastomosis, bypass or graft as the cause of abnormal reaction of the patient, or of later complication, without mention of misadventure at the time of the procedure: Secondary | ICD-10-CM | POA: Insufficient documentation

## 2016-09-06 DIAGNOSIS — F0390 Unspecified dementia without behavioral disturbance: Secondary | ICD-10-CM | POA: Insufficient documentation

## 2016-09-06 DIAGNOSIS — I120 Hypertensive chronic kidney disease with stage 5 chronic kidney disease or end stage renal disease: Secondary | ICD-10-CM | POA: Insufficient documentation

## 2016-09-06 DIAGNOSIS — Z951 Presence of aortocoronary bypass graft: Secondary | ICD-10-CM | POA: Insufficient documentation

## 2016-09-06 DIAGNOSIS — I2510 Atherosclerotic heart disease of native coronary artery without angina pectoris: Secondary | ICD-10-CM | POA: Diagnosis not present

## 2016-09-06 HISTORY — PX: PERIPHERAL VASCULAR CATHETERIZATION: SHX172C

## 2016-09-06 LAB — POCT I-STAT, CHEM 8
BUN: 47 mg/dL — AB (ref 6–20)
Calcium, Ion: 1 mmol/L — ABNORMAL LOW (ref 1.15–1.40)
Chloride: 95 mmol/L — ABNORMAL LOW (ref 101–111)
Creatinine, Ser: 4.6 mg/dL — ABNORMAL HIGH (ref 0.61–1.24)
Glucose, Bld: 96 mg/dL (ref 65–99)
HEMATOCRIT: 34 % — AB (ref 39.0–52.0)
HEMOGLOBIN: 11.6 g/dL — AB (ref 13.0–17.0)
POTASSIUM: 5.8 mmol/L — AB (ref 3.5–5.1)
SODIUM: 138 mmol/L (ref 135–145)
TCO2: 37 mmol/L (ref 0–100)

## 2016-09-06 SURGERY — A/V SHUNTOGRAM/FISTULAGRAM

## 2016-09-06 MED ORDER — LIDOCAINE HCL (PF) 1 % IJ SOLN
INTRAMUSCULAR | Status: AC
  Filled 2016-09-06: qty 30

## 2016-09-06 MED ORDER — LIDOCAINE HCL (PF) 1 % IJ SOLN
INTRAMUSCULAR | Status: DC | PRN
  Administered 2016-09-06: 3 mL

## 2016-09-06 MED ORDER — OXYCODONE-ACETAMINOPHEN 5-325 MG PO TABS: 1.0000 | ORAL_TABLET | ORAL | Status: DC | PRN

## 2016-09-06 MED ORDER — SODIUM CHLORIDE 0.9% FLUSH: 3.0000 mL | INTRAVENOUS | Status: DC | PRN

## 2016-09-06 MED ORDER — IODIXANOL 320 MG/ML IV SOLN
INTRAVENOUS | Status: DC | PRN
  Administered 2016-09-06: 65 mL via INTRAVENOUS

## 2016-09-06 MED ORDER — HEPARIN (PORCINE) IN NACL 2-0.9 UNIT/ML-% IJ SOLN
INTRAMUSCULAR | Status: DC | PRN
  Administered 2016-09-06: 500 mL

## 2016-09-06 SURGICAL SUPPLY — 19 items
BAG SNAP BAND KOVER 36X36 (MISCELLANEOUS) ×4 IMPLANT
BALLN ARMADA 10X60X80 (BALLOONS) ×4
BALLOON ARMADA 10X60X80 (BALLOONS) ×2 IMPLANT
COVER DOME SNAP 22 D (MISCELLANEOUS) ×4 IMPLANT
COVER PRB 48X5XTLSCP FOLD TPE (BAG) ×2 IMPLANT
COVER PROBE 5X48 (BAG) ×2
KIT ENCORE 26 ADVANTAGE (KITS) ×4 IMPLANT
KIT MICROINTRODUCER STIFF 5F (SHEATH) ×4 IMPLANT
PROTECTION STATION PRESSURIZED (MISCELLANEOUS) ×4
SHEATH PINNACLE R/O II 6F 4CM (SHEATH) ×4 IMPLANT
SHEATH PINNACLE R/O II 7F 4CM (SHEATH) ×4 IMPLANT
STATION PROTECTION PRESSURIZED (MISCELLANEOUS) ×2 IMPLANT
STENT VIABAHN 10X50X120 (Permanent Stent) ×4 IMPLANT
STOPCOCK MORSE 400PSI 3WAY (MISCELLANEOUS) ×4 IMPLANT
SYRINGE MEDRAD AVANTA MACH 7 (SYRINGE) ×4 IMPLANT
TRAY PV CATH (CUSTOM PROCEDURE TRAY) ×4 IMPLANT
TUBING CIL FLEX 10 FLL-RA (TUBING) ×4 IMPLANT
WIRE BENTSON .035X145CM (WIRE) ×4 IMPLANT
WIRE ROSEN-J .035X260CM (WIRE) ×4 IMPLANT

## 2016-09-06 NOTE — Discharge Instructions (Signed)
Fistulogram, Care After °Refer to this sheet in the next few weeks. These instructions provide you with information on caring for yourself after your procedure. Your health care provider may also give you more specific instructions. Your treatment has been planned according to current medical practices, but problems sometimes occur. Call your health care provider if you have any problems or questions after your procedure. °WHAT TO EXPECT AFTER THE PROCEDURE °After your procedure, it is typical to have the following: °· A small amount of discomfort in the area where the catheters were placed. °· A small amount of bruising around the fistula. °· Sleepiness and fatigue. °HOME CARE INSTRUCTIONS °· Rest at home for the day following your procedure. °· Do not drive or operate heavy machinery while taking pain medicine. °· Take medicines only as directed by your health care provider. °· Do not take baths, swim, or use a hot tub until your health care provider approves. You may shower 24 hours after the procedure or as directed by your health care provider. °· There are many different ways to close and cover an incision, including stitches, skin glue, and adhesive strips. Follow your health care provider's instructions on: °¨ Incision care. °¨ Bandage (dressing) changes and removal. °¨ Incision closure removal. °· Monitor your dialysis fistula carefully. °SEEK MEDICAL CARE IF: °· You have drainage, redness, swelling, or pain at your catheter site. °· You have a fever. °· You have chills. °SEEK IMMEDIATE MEDICAL CARE IF: °· You feel weak. °· You have trouble balancing. °· You have trouble moving your arms or legs. °· You have problems with your speech or vision. °· You can no longer feel a vibration or buzz when you put your fingers over your dialysis fistula. °· The limb that was used for the procedure: °¨ Swells. °¨ Is painful. °¨ Is cold. °¨ Is discolored, such as blue or pale white. °  °This information is not intended  to replace advice given to you by your health care provider. Make sure you discuss any questions you have with your health care provider. °  °Document Released: 03/23/2014 Document Reviewed: 03/23/2014 °Elsevier Interactive Patient Education ©2016 Elsevier Inc. ° °

## 2016-09-06 NOTE — H&P (Signed)
Hospital Consult   History of Present Illness: This is a 75 y.o. male with history incision disease left upper arm AV fistula. He had bleeding following dialysis on Saturday prompting ED visit. He had Dermabond placed which has subsided the bleeding. He has not had dialysis and Saturday. He is not short of breath today has no other issues related to today's visit.  Past Medical History:  Diagnosis Date  . Anemia   . Cancer ([place])    SKIN  . CKD III-IV    a. since transplant in 2001 with significant worsening since 06/2013.  Marland Kitchen Coronary artery disease   . Dementia    mild  . Diabetes mellitus without complication (HCC)    FASTING CBG 90S  . Headache(784.0)   . 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 1995 CABG x 4 in [place], [place];  b. 2001 Developed CHF;  c. 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 10/2000 Cardiac transplant @ [facility], [place];  e. Historically nl Bx and stress testing.  Last stress test ~ 5 yrs ago.  Marland Kitchen Hernia of abdominal wall   . History of blood transfusion   . History of stroke   . Hypercalcemia    a. 06/2013 Hospitalized in [place].  Marland Kitchen Hyperlipidemia   . Hypertension    a. Long h/o HTN, came off of all meds following transplant/wt loss-->meds resumed 06/2013, difficult to control since.  . Myocardial infarction    PRIOR TO HEART TRANSPLANT  . Orthostatic dizziness   . Orthostatic hypotension    a. Has tried support hose and compression stockings - prefers not to wear.  . Shortness of breath    EXERTION  . Stroke ([place]) 06/2013   NO RESIDUAL - WAS CONFUSED AT TIME - THAT HAS RESOLVED    Past Surgical History:  Procedure Laterality Date  . BASCILIC VEIN TRANSPOSITION Left 07/06/2014   Procedure: [place];  Surgeon: Rosetta Posner, MD;  Location: [facility];  Service: Vascular;  Laterality: Left;  . CARDIAC SURGERY    . CATARACT EXTRACTION    . CORONARY ARTERY BYPASS GRAFT    . CRANIOTOMY Right 05/04/2016   Procedure: Right Craniotomy for Evacuation of Subdural  Hematoma;  Surgeon: Earnie Larsson, MD;  Location: [facility] NEURO ORS;  Service: Neurosurgery;  Laterality: Right;  . EYE SURGERY Bilateral    CATARACTS  . HEART TRANSPLANT    . SKIN GRAFT FULL THICKNESS LEG     cat scratch that did not heal  . VASECTOMY      Allergies  Allergen Reactions  . Norvasc [Amlodipine Besylate] Swelling and Other (See Comments)    Swelling of the lower body   . Ace Inhibitors Other (See Comments)    On dialysis - cannot take these meds  . Angiotensin Receptor Blockers Other (See Comments)    On dialysis - cannot take these meds    Prior to Admission medications   Medication Sig Start Date End Date Taking? Authorizing Provider  acetaminophen (TYLENOL) 325 MG tablet Take 2 tablets (650 mg total) by mouth every 6 (six) hours as needed for mild pain (or Fever >/= 101). Patient taking differently: Take 650 mg by mouth every 6 (six) hours as needed for mild pain.  10/14/14  Yes Delfina Redwood, MD  aspirin EC 81 MG tablet Take 81 mg by mouth at bedtime.   Yes Historical Provider, MD  calcium acetate (PHOSLO) 667 MG capsule Take 1 capsule (667 mg total) by mouth 3 (three) times  daily with meals. 08/24/16  Yes Shanker Kristeen Mans, MD  cholecalciferol (VITAMIN D) 1000 UNITS tablet Take 1,000 Units by mouth daily.   Yes Historical Provider, MD  cycloSPORINE modified (NEORAL) 25 MG capsule Take 50 mg by mouth 2 (two) times daily.  08/18/14  Yes Lelon Perla, MD  donepezil (ARICEPT) 10 MG tablet Take 10 mg by mouth at bedtime.  03/01/15  Yes Historical Provider, MD  FLUoxetine (PROZAC) 20 MG capsule Take 20 mg by mouth daily.    Yes Historical Provider, MD  fluticasone (FLONASE) 50 MCG/ACT nasal spray Place 2 sprays into both nostrils at bedtime as needed for allergies.  04/12/16  Yes Historical Provider, MD  lansoprazole (PREVACID) 30 MG capsule Take 30 mg by mouth See admin instructions. Take 1 capsule (30 mg) by mouthdaily on Sunday, Monday, Wednesday, Friday (non-dialysis  days) 12/22/15  Yes Historical Provider, MD  lansoprazole (PREVACID) 30 MG capsule Take 30 mg by mouth See admin instructions. On Sundays   Yes Historical Provider, MD  levothyroxine (SYNTHROID, LEVOTHROID) 75 MCG tablet Take 75 mcg by mouth daily.    Yes Historical Provider, MD  metoprolol tartrate (LOPRESSOR) 25 MG tablet Take 2 tablets (50 mg total) by mouth 2 (two) times daily. 12/27/14  Yes Ripudeep Krystal Eaton, MD  multivitamin (RENA-VIT) TABS tablet Take 1 tablet by mouth daily.  03/08/15  Yes Historical Provider, MD  mycophenolate (CELLCEPT) 250 MG capsule Take 250 mg by mouth daily. On non dialysis days   Yes Historical Provider, MD  mycophenolate (CELLCEPT) 500 MG tablet Take 500 mg by mouth daily. Every Tuesday, Thursday and Saturday   Yes Historical Provider, MD  Nutritional Supplements (NEPRO) LIQD Take 237 mLs by mouth daily at 12 noon. At 1200 with lunch   Yes Historical Provider, MD  pravastatin (PRAVACHOL) 10 MG tablet Take 10 mg by mouth at bedtime.    Yes Historical Provider, MD  Probiotic Product (PRO-BIOTIC BLEND) CAPS Take 1 capsule by mouth daily.   Yes Historical Provider, MD  vitamin B-12 (CYANOCOBALAMIN) 1000 MCG tablet Take 1,000 mcg by mouth daily with breakfast.   Yes Historical Provider, MD  diphenoxylate-atropine (LOMOTIL) 2.5-0.025 MG tablet Take 1 tablet by mouth 4 (four) times daily as needed for diarrhea or loose stools. 08/24/16   Shanker Kristeen Mans, MD  diphenoxylate-atropine (LOMOTIL) 2.5-0.025 MG tablet Take by mouth. 09/23/15   Historical Provider, MD  Vancomycin (VANCOCIN) 750-5 MG/150ML-% SOLN Inject 150 mLs (750 mg total) into the vein Every Tuesday,Thursday,and Saturday with dialysis. 10 days from 10/5 Patient not taking: Reported on 09/05/2016 08/26/16   Jonetta Osgood, MD    Social History   Social History  . Marital status: Married    Spouse name: N/A  . Number of children: 1  . Years of education: N/A   Occupational History  . Education officer, community     Social History Main Topics  . Smoking status: Never Smoker  . Smokeless tobacco: Never Used  . Alcohol use No  . Drug use: No  . Sexual activity: Not on file   Other Topics Concern  . Not on file   Social History Narrative   Patient is from [place].  Moved to [place] in late '90's.  Moved to [place] 06/20/2014 to live closer to son.  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  Plans to continue to work now that he is in [place].     Family History  Problem Relation Age of Onset  .  Heart attack Father     died @ 24  . Diabetes Father   . Heart attack Mother   . Diabetes Mother   . Kidney disease Maternal Aunt     ROS: no complaints today  Physical Examination  Vitals:   09/06/16 1016  BP: (!) 192/80  Pulse: 61  Resp: 20  Temp: 98.2 F (36.8 C)   Body mass index is 18.62 kg/m.  General:  WDWN in NAD Gait: Not observed HENT: WNL, normocephalic Pulmonary: normal non-labored breathing, without Rales, rhonchi,  wheezing Cardiac: palpable left radial pulse, pulsatility in left arm avf Abdomen:soft, NT/ND, no masses Extremities: left arm avf with residual dermabond in place Musculoskeletal: no muscle wasting or atrophy  Neurologic: A&O X 3; Appropriate Affect ; SENSATION: normal; MOTOR FUNCTION:  moving all extremities equally. Speech is fluent/normal  CBC    Component Value Date/Time   WBC 5.7 08/23/2016 0724   RBC 3.21 (L) 08/23/2016 0724   HGB 11.6 (L) 09/06/2016 1117   HCT 34.0 (L) 09/06/2016 1117   PLT 224 08/23/2016 0724   MCV 99.1 08/23/2016 0724   MCH 30.8 08/23/2016 0724   MCHC 31.1 08/23/2016 0724   RDW 16.8 (H) 08/23/2016 0724   LYMPHSABS 1.9 08/22/2016 1320   MONOABS 0.6 08/22/2016 1320   EOSABS 0.2 08/22/2016 1320   BASOSABS 0.0 08/22/2016 1320    BMET    Component Value Date/Time   NA 138 09/06/2016 1117   K 5.8 (H) 09/06/2016 1117   CL 95 (L) 09/06/2016 1117   CO2 27 08/24/2016 0748   GLUCOSE 96 09/06/2016 1117   BUN  47 (H) 09/06/2016 1117   CREATININE 4.60 (H) 09/06/2016 1117   CALCIUM 8.4 (L) 08/24/2016 0748   GFRNONAA 13 (L) 08/24/2016 0748   GFRAA 15 (L) 08/24/2016 0748    COAGS: Lab Results  Component Value Date   INR 1.19 05/04/2016   INR 1.11 12/26/2014   INR 1.32 08/25/2014      ASSESSMENT/PLAN: This is a 75 y.o. male with esrd, prolonged bleeding following dialysis with pulsatility in avf. Fistulogram with intervention today. Discussed risks and benefits and consent signed.  Christ Fullenwider C. Donzetta Matters, MD [facility] Office: [phone] Pager: [phone]

## 2016-09-06 NOTE — Op Note (Signed)
    Patient name: [name] [name] MRN: [id number] DOB: 03-08-1941 Sex: male  09/06/2016 Pre-operative Diagnosis: esrd Post-operative diagnosis:  Same Surgeon:  Erlene Quan C. Donzetta Matters, MD Procedure Performed: 1.  US guided cannulation of left arm avf 2.  Balloon angioplasty of left innominate vein with 6mm  3.  Stent of left axillary vein with 19mm viabahn 4. Left arm venogram    Indications:  75 year old white male history of end-stage renal disease left upper extremity basilic vein transposition fistula. He had bleeding from this fistula postdialysis bleeding prior to this procedure. He has pulsatility in the fistula is indicated for the above procedure.  Findings: There is stenosis centrally at the level of the innominate subclavian junction probably in the thoracic outlet. There is also a tight stenosis in the axillary vein requiring balloon angioplasty with rupture and viabahn stent placement. At completion there was a palpable thrill throughout the fistula.   Procedure:  The patient was identified in the holding area and taken to room 8.  He was sterilely prepped and draped in his left upper extremity timeout called. He was ultrasound guidance to cannulate the left upper extremity AV fistula micropuncture needle wire and catheter and performed left upper extremity as well as central venogram. We then placed 6 French sheath passed a Benson wire centrally at least 10 mm balloon to balloon the areas of stenosis at the innominate subclavian junction as well as in the axillary vein. With the balloon inflated in the axillary we did perform a retrograde shot demonstrated flow from the arterial anastomosis. After balloon deflation we performed venogram demonstrated rupture of the vein. We subsequently removed balloon and sheath and using a viabahn stent 10 cm x 5 mm and deployed at the area of perforation. We then placed a 7 French sheath we balloon the area of stenting. Completion venogram demonstrated  brisk runoff with residual stenosis at the level of the thoracic outlet. There was a palpable thrill throughout the fistula. Sheath and wire were removed for go monocryl suture used for hemostasis. Patient tolerated procedure well transferred to the holding and will be discharged home.  If persistent stenosis centrally may require consideration of left 1st rib resection.  Brandon C. Donzetta Matters, MD [facility] Office: [phone] Pager: [phone]

## 2016-09-13 ENCOUNTER — Ambulatory Visit: Payer: Medicare HMO | Admitting: Podiatry

## 2016-11-09 ENCOUNTER — Emergency Department (HOSPITAL_COMMUNITY)
Admission: EM | Admit: 2016-11-09 | Discharge: 2016-11-09 | Disposition: A | Discharge status: Home / Self Care | Payer: Medicare HMO | Attending: Emergency Medicine | Admitting: Emergency Medicine

## 2016-11-09 ENCOUNTER — Encounter (HOSPITAL_COMMUNITY): Payer: Self-pay | Admitting: *Deleted

## 2016-11-09 ENCOUNTER — Emergency Department (HOSPITAL_COMMUNITY): Payer: Medicare HMO

## 2016-11-09 DIAGNOSIS — Z79899 Other long term (current) drug therapy: Secondary | ICD-10-CM | POA: Insufficient documentation

## 2016-11-09 DIAGNOSIS — I252 Old myocardial infarction: Secondary | ICD-10-CM | POA: Insufficient documentation

## 2016-11-09 DIAGNOSIS — I10 Essential (primary) hypertension: Secondary | ICD-10-CM

## 2016-11-09 DIAGNOSIS — Z8673 Personal history of transient ischemic attack (TIA), and cerebral infarction without residual deficits: Secondary | ICD-10-CM | POA: Diagnosis not present

## 2016-11-09 DIAGNOSIS — E1122 Type 2 diabetes mellitus with diabetic chronic kidney disease: Secondary | ICD-10-CM | POA: Diagnosis not present

## 2016-11-09 DIAGNOSIS — Z951 Presence of aortocoronary bypass graft: Secondary | ICD-10-CM | POA: Insufficient documentation

## 2016-11-09 DIAGNOSIS — I129 Hypertensive chronic kidney disease with stage 1 through stage 4 chronic kidney disease, or unspecified chronic kidney disease: Secondary | ICD-10-CM | POA: Diagnosis not present

## 2016-11-09 DIAGNOSIS — I2510 Atherosclerotic heart disease of native coronary artery without angina pectoris: Secondary | ICD-10-CM | POA: Insufficient documentation

## 2016-11-09 DIAGNOSIS — N184 Chronic kidney disease, stage 4 (severe): Secondary | ICD-10-CM | POA: Diagnosis not present

## 2016-11-09 DIAGNOSIS — J42 Unspecified chronic bronchitis: Secondary | ICD-10-CM | POA: Diagnosis not present

## 2016-11-09 DIAGNOSIS — Z7982 Long term (current) use of aspirin: Secondary | ICD-10-CM | POA: Insufficient documentation

## 2016-11-09 DIAGNOSIS — R0602 Shortness of breath: Secondary | ICD-10-CM | POA: Diagnosis present

## 2016-11-09 LAB — CBC WITH DIFFERENTIAL/PLATELET
BASOS PCT: 1 %
Basophils Absolute: 0.1 10*3/uL (ref 0.0–0.1)
EOS ABS: 0.4 10*3/uL (ref 0.0–0.7)
Eosinophils Relative: 3 %
HCT: 33.2 % — ABNORMAL LOW (ref 39.0–52.0)
HEMOGLOBIN: 10.6 g/dL — AB (ref 13.0–17.0)
LYMPHS ABS: 3 10*3/uL (ref 0.7–4.0)
Lymphocytes Relative: 27 %
MCH: 31.7 pg (ref 26.0–34.0)
MCHC: 31.9 g/dL (ref 30.0–36.0)
MCV: 99.4 fL (ref 78.0–100.0)
Monocytes Absolute: 0.7 10*3/uL (ref 0.1–1.0)
Monocytes Relative: 6 %
NEUTROS PCT: 63 %
Neutro Abs: 7 10*3/uL (ref 1.7–7.7)
Platelets: 241 10*3/uL (ref 150–400)
RBC: 3.34 MIL/uL — AB (ref 4.22–5.81)
RDW: 14.1 % (ref 11.5–15.5)
WBC: 11.2 10*3/uL — AB (ref 4.0–10.5)

## 2016-11-09 LAB — BASIC METABOLIC PANEL
Anion gap: 15 (ref 5–15)
BUN: 37 mg/dL — ABNORMAL HIGH (ref 6–20)
CHLORIDE: 96 mmol/L — AB (ref 101–111)
CO2: 28 mmol/L (ref 22–32)
Calcium: 8.3 mg/dL — ABNORMAL LOW (ref 8.9–10.3)
Creatinine, Ser: 5.29 mg/dL — ABNORMAL HIGH (ref 0.61–1.24)
GFR calc non Af Amer: 9 mL/min — ABNORMAL LOW (ref >60)
GFR, EST AFRICAN AMERICAN: 11 mL/min — AB (ref >60)
Glucose, Bld: 130 mg/dL — ABNORMAL HIGH (ref 65–99)
POTASSIUM: 3.8 mmol/L (ref 3.5–5.1)
SODIUM: 139 mmol/L (ref 135–145)

## 2016-11-09 MED ORDER — IPRATROPIUM BROMIDE 0.02 % IN SOLN
1.0000 mg | Freq: Once | RESPIRATORY_TRACT | Status: AC
  Administered 2016-11-09: 1 mg via RESPIRATORY_TRACT
  Filled 2016-11-09: qty 5

## 2016-11-09 MED ORDER — PREDNISONE 20 MG PO TABS: 60.0000 mg | ORAL_TABLET | Freq: Every day | ORAL | 0 refills | Status: DC

## 2016-11-09 MED ORDER — ALBUTEROL SULFATE HFA 108 (90 BASE) MCG/ACT IN AERS: 2.0000 | INHALATION_SPRAY | RESPIRATORY_TRACT | 0 refills | Status: DC | PRN

## 2016-11-09 MED ORDER — ALBUTEROL (5 MG/ML) CONTINUOUS INHALATION SOLN
10.0000 mg/h | INHALATION_SOLUTION | Freq: Once | RESPIRATORY_TRACT | Status: AC
  Administered 2016-11-09: 10 mg/h via RESPIRATORY_TRACT
  Filled 2016-11-09: qty 20

## 2016-11-09 MED ORDER — IPRATROPIUM-ALBUTEROL 0.5-2.5 (3) MG/3ML IN SOLN
3.0000 mL | Freq: Once | RESPIRATORY_TRACT | Status: AC
  Administered 2016-11-09: 3 mL via RESPIRATORY_TRACT
  Filled 2016-11-09: qty 3

## 2016-11-09 MED ORDER — ALBUTEROL SULFATE (2.5 MG/3ML) 0.083% IN NEBU: 2.5000 mg | INHALATION_SOLUTION | Freq: Four times a day (QID) | RESPIRATORY_TRACT | 12 refills | Status: DC | PRN

## 2016-11-09 MED ORDER — METHYLPREDNISOLONE SODIUM SUCC 125 MG IJ SOLR
125.0000 mg | Freq: Once | INTRAMUSCULAR | Status: AC
  Administered 2016-11-09: 125 mg via INTRAVENOUS
  Filled 2016-11-09: qty 2

## 2016-11-09 NOTE — ED Triage Notes (Signed)
The pt arrived by gems from home  He has had a chest cold for 2 weeks  Earlier he was getting ready to go to bed he developed sob  .  When ems arrived he had wheezes in all lung fileds.  Ems gave a hhn albuterol  sats increased cough non-productive  Dialysis pt fistula lt arm  Bruises lt euuper arm from a dialysis infiltrate thursday

## 2016-11-09 NOTE — ED Notes (Signed)
Pt ambulated past Drs. Office. Pt to be discharged to dialysis.

## 2016-11-09 NOTE — ED Notes (Signed)
No respiratory distress at present

## 2016-11-09 NOTE — ED Provider Notes (Signed)
By signing my name below, I, Georgette Shell, attest that this documentation has been prepared under the direction and in the presence of [place], DO. Electronically Signed: Georgette Shell, ED Scribe. 11/09/16. 3:32 AM.  TIME SEEN: 3:25 AM  CHIEF COMPLAINT:  Chief Complaint  Patient presents with  . Shortness of Breath   HPI:  [name] [name] is a 75 y.o. male with h/o cancer, CAD, DM, CVA,  MI, chronic bronchitis, asthma, and HTN who presents to the Emergency Department by EMS complaining of shortness of breath onset two weeks ago, worsening just PTA. Pt also has associated wheezing. Does not have a history of wearing oxygen regularly. Pt was going to bed at the onset of his symptoms. Pt notes he has had chest congestion for the past two weeks. EMS gave pt hhn albuterol en route with moderate relief. Pt is on dialysis, his last dialysis was Saturday. Reports he does every Tuesday, Thursday and Saturday.. No known fevers. Denies any chest pain.  ROS: See HPI Constitutional: no fever  Eyes: no drainage  ENT: no runny nose   Cardiovascular:  no chest pain  Resp: SOB  GI: no vomiting GU: no dysuria Integumentary: no rash  Allergy: no hives  Musculoskeletal: no leg swelling  Neurological: no slurred speech ROS otherwise negative  PAST MEDICAL HISTORY/PAST SURGICAL HISTORY:  Past Medical History:  Diagnosis Date  . Anemia   . Cancer ([place])    SKIN  . CKD III-IV    a. since transplant in 2001 with significant worsening since 06/2013.  Marland Kitchen Coronary artery disease   . Dementia    mild  . Diabetes mellitus without complication (HCC)    FASTING CBG 90S  . Headache(784.0)   . 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 1995 CABG x 4 in [place], [place];  b. 2001 Developed CHF;  c. 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 10/2000 Cardiac transplant @ [facility], [place];  e. Historically nl Bx and stress testing.  Last stress test ~ 5 yrs ago.  Marland Kitchen Hernia of abdominal wall   . History of blood transfusion   .  History of stroke   . Hypercalcemia    a. 06/2013 Hospitalized in [place].  Marland Kitchen Hyperlipidemia   . Hypertension    a. Long h/o HTN, came off of all meds following transplant/wt loss-->meds resumed 06/2013, difficult to control since.  . Myocardial infarction    PRIOR TO HEART TRANSPLANT  . Orthostatic dizziness   . Orthostatic hypotension    a. Has tried support hose and compression stockings - prefers not to wear.  . Shortness of breath    EXERTION  . Stroke ([place]) 06/2013   NO RESIDUAL - WAS CONFUSED AT TIME - THAT HAS RESOLVED    MEDICATIONS:  Prior to Admission medications   Medication Sig Start Date End Date Taking? Authorizing Provider  acetaminophen (TYLENOL) 325 MG tablet Take 2 tablets (650 mg total) by mouth every 6 (six) hours as needed for mild pain (or Fever >/= 101). Patient taking differently: Take 650 mg by mouth every 6 (six) hours as needed for mild pain.  10/14/14   Delfina Redwood, MD  aspirin EC 81 MG tablet Take 81 mg by mouth at bedtime.    Historical Provider, MD  calcium acetate (PHOSLO) 667 MG capsule Take 1 capsule (667 mg total) by mouth 3 (three) times daily with meals. 08/24/16   Shanker Kristeen Mans, MD  cholecalciferol (VITAMIN D) 1000 UNITS tablet Take 1,000 Units  by mouth daily.    Historical Provider, MD  cycloSPORINE modified (NEORAL) 25 MG capsule Take 50 mg by mouth 2 (two) times daily.  08/18/14   Lelon Perla, MD  diphenoxylate-atropine (LOMOTIL) 2.5-0.025 MG tablet Take 1 tablet by mouth 4 (four) times daily as needed for diarrhea or loose stools. 08/24/16   Shanker Kristeen Mans, MD  diphenoxylate-atropine (LOMOTIL) 2.5-0.025 MG tablet Take by mouth. 09/23/15   Historical Provider, MD  donepezil (ARICEPT) 10 MG tablet Take 10 mg by mouth at bedtime.  03/01/15   Historical Provider, MD  FLUoxetine (PROZAC) 20 MG capsule Take 20 mg by mouth daily.     Historical Provider, MD  fluticasone (FLONASE) 50 MCG/ACT nasal spray Place 2 sprays into both nostrils at  bedtime as needed for allergies.  04/12/16   Historical Provider, MD  lansoprazole (PREVACID) 30 MG capsule Take 30 mg by mouth See admin instructions. Take 1 capsule (30 mg) by mouthdaily on Sunday, Monday, Wednesday, Friday (non-dialysis days) 12/22/15   Historical Provider, MD  lansoprazole (PREVACID) 30 MG capsule Take 30 mg by mouth See admin instructions. On Sundays    Historical Provider, MD  levothyroxine (SYNTHROID, LEVOTHROID) 75 MCG tablet Take 75 mcg by mouth daily.     Historical Provider, MD  metoprolol tartrate (LOPRESSOR) 25 MG tablet Take 2 tablets (50 mg total) by mouth 2 (two) times daily. 12/27/14   Ripudeep Krystal Eaton, MD  multivitamin (RENA-VIT) TABS tablet Take 1 tablet by mouth daily.  03/08/15   Historical Provider, MD  mycophenolate (CELLCEPT) 250 MG capsule Take 250 mg by mouth daily. On non dialysis days    Historical Provider, MD  mycophenolate (CELLCEPT) 500 MG tablet Take 500 mg by mouth daily. Every Tuesday, Thursday and Saturday    Historical Provider, MD  Nutritional Supplements (NEPRO) LIQD Take 237 mLs by mouth daily at 12 noon. At 1200 with lunch    Historical Provider, MD  pravastatin (PRAVACHOL) 10 MG tablet Take 10 mg by mouth at bedtime.     Historical Provider, MD  Probiotic Product (PRO-BIOTIC BLEND) CAPS Take 1 capsule by mouth daily.    Historical Provider, MD  Vancomycin (VANCOCIN) 750-5 MG/150ML-% SOLN Inject 150 mLs (750 mg total) into the vein Every Tuesday,Thursday,and Saturday with dialysis. 10 days from 10/5 Patient not taking: Reported on 09/05/2016 08/26/16   Jonetta Osgood, MD  vitamin B-12 (CYANOCOBALAMIN) 1000 MCG tablet Take 1,000 mcg by mouth daily with breakfast.    Historical Provider, MD    ALLERGIES:  Allergies  Allergen Reactions  . Norvasc [Amlodipine Besylate] Swelling and Other (See Comments)    Swelling of the lower body   . Ace Inhibitors Other (See Comments)    On dialysis - cannot take these meds  . Angiotensin Receptor Blockers  Other (See Comments)    On dialysis - cannot take these meds    SOCIAL HISTORY:  Social History  Substance Use Topics  . Smoking status: Never Smoker  . Smokeless tobacco: Never Used  . Alcohol use No    FAMILY HISTORY: Family History  Problem Relation Age of Onset  . Heart attack Father     died @ 52  . Diabetes Father   . Heart attack Mother   . Diabetes Mother   . Kidney disease Maternal Aunt     EXAM: BP (!) 231/85   Pulse 63   Temp 97.5 F (36.4 C)   Resp 16   Ht 6\' 2"  (1.88 m)  Wt 145 lb (65.8 kg)   SpO2 100%   BMI 18.62 kg/m  CONSTITUTIONAL: Alert and oriented and responds appropriately to questions. Elderly, chronically ill-appearing.  HEAD: Normocephalic EYES: Conjunctivae clear, PERRL, EOMI ENT: normal nose; no rhinorrhea; moist mucous membranes NECK: Supple, no meningismus, no nuchal rigidity, no LAD  CARD: RRR; S1 and S2 appreciated; no murmurs, no clicks, no rubs, no gallops RESP: Normal chest excursion without splinting or tachypnea; no wheezes, intermittent rhonchi express sounds that clear with coughing, no rales, no hypoxia or respiratory distress, speaking full sentences ABD/GI: Normal bowel sounds; non-distended; soft, non-tender, no rebound, no guarding, no peritoneal signs, no hepatosplenomegaly BACK:  The back appears normal and is non-tender to palpation, there is no CVA tenderness EXT: Normal ROM in all joints; non-tender to palpation; normal capillary refill; no cyanosis, no calf tenderness or swelling. Dialysis fistula in LUE with swelling, ecchymosis. No bleeding, no drainage. Nontender. 2+ radial pulse on left side. SKIN: Normal color for age and race; warm; no rash NEURO: Moves all extremities equally, sensation to light touch intact diffusely, cranial nerves II through XII intact, normal speech PSYCH: The patient's mood and manner are appropriate. Grooming and personal hygiene are appropriate.  MEDICAL DECISION MAKING: Patient here  shortness of breath. He does have rhonchorous breath sounds diffusely that clear when he coughs. Received albuterol via EMS and states feeling better. No hypoxia. No recent fevers but has reported viral URI symptoms for the past 2 weeks. No chest pain. No sign of volume overload on exam. Will give another duo nab, obtain labs, chest x-ray and reassess.  ED PROGRESS: 6:00 AM Patient's labs show mild leukocytosis without left shift. Potassium normal. Chest x-ray clear. He still has some rhonchorous breath sounds and states he feels very short of breath when he tries to walk still. We were unable to ambulate him he has had no hypoxia in the emergency department. States that his blood pressure is normally very elevated when he lies down and drops when he stands up. Has a history of orthostasis. States he felt lightheaded with standing which his wife at bedside reports is normal. He denies headache, vision changes, numbness or weakness, chest pain. We will give another albuterol and Atrovent. We'll also give IV Solu-Medrol. His wife feels that if we can discharge him with a prescription for a nebulizer machine and albuterol that he will do well at home.  7:35 AM  Pt's breath sounds are improving and he states he is feeling "much better". No hypoxia. Wife reports he is able to ambulate very short distances with his walker and again often becomes very lightheaded. He has dialysis scheduled at 10:30 in the morning and I feel it is appropriate for him to be discharged from the ED once his nebulizer treatment is done and as long as he is feeling better and not hypoxic. Recommended that he go to dialysis today. We'll discharge with albuterol inhaler, prednisone burst. Will also discharge with prescription for a nebulizer machine at home as well as albuterol for his nebulizer machine. He has a PCP for follow-up. Discussed at length return precautions with patient and his wife. They're comfortable with this plan.  I  reviewed all nursing notes, vitals, pertinent old records, EKGs, labs, imaging (as available).    EKG Interpretation  Date/Time:  Thursday November 09 2016 03:00:35 EST Ventricular Rate:  65 PR Interval:    QRS Duration: 124 QT Interval:  477 QTC Calculation: 496 R Axis:   82 Text  Interpretation:  Sinus rhythm Right bundle branch block No significant change since last tracing Confirmed by WARD,  DO, KRISTEN [id number]) on 11/09/2016 3:03:05 AM        I personally performed the services described in this documentation, which was scribed in my presence. The recorded information has been reviewed and is accurate.     [place], DO 11/09/16 [phone]

## 2016-11-09 NOTE — ED Notes (Signed)
Pt attempted to ambulate x3. Pt felt dizzy and was unable to walk without feeling lightheaded

## 2016-11-09 NOTE — ED Notes (Signed)
No signs of distress; breathing tx in process; no needs.

## 2016-11-09 NOTE — ED Provider Notes (Signed)
Patient ambulates with a walker, will be discharged to go to dialysis this morning   [name] Muskrat, MD 11/09/16 [phone]

## 2017-02-03 ENCOUNTER — Emergency Department (HOSPITAL_COMMUNITY): Payer: Medicare HMO

## 2017-02-03 ENCOUNTER — Inpatient Hospital Stay (HOSPITAL_COMMUNITY)
Admission: EM | Admit: 2017-02-03 | Discharge: 2017-02-06 | DRG: 640 | Disposition: A | Discharge status: Skilled Nursing Facility | Payer: Medicare HMO | Attending: Internal Medicine | Admitting: Internal Medicine

## 2017-02-03 ENCOUNTER — Encounter (HOSPITAL_COMMUNITY): Payer: Self-pay

## 2017-02-03 DIAGNOSIS — Z992 Dependence on renal dialysis: Secondary | ICD-10-CM

## 2017-02-03 DIAGNOSIS — R5381 Other malaise: Secondary | ICD-10-CM | POA: Diagnosis present

## 2017-02-03 DIAGNOSIS — M898X9 Other specified disorders of bone, unspecified site: Secondary | ICD-10-CM | POA: Diagnosis present

## 2017-02-03 DIAGNOSIS — R531 Weakness: Secondary | ICD-10-CM

## 2017-02-03 DIAGNOSIS — Z79899 Other long term (current) drug therapy: Secondary | ICD-10-CM

## 2017-02-03 DIAGNOSIS — N186 End stage renal disease: Secondary | ICD-10-CM | POA: Diagnosis not present

## 2017-02-03 DIAGNOSIS — I151 Hypertension secondary to other renal disorders: Secondary | ICD-10-CM

## 2017-02-03 DIAGNOSIS — R0602 Shortness of breath: Secondary | ICD-10-CM

## 2017-02-03 DIAGNOSIS — F329 Major depressive disorder, single episode, unspecified: Secondary | ICD-10-CM | POA: Diagnosis present

## 2017-02-03 DIAGNOSIS — Z941 Heart transplant status: Secondary | ICD-10-CM

## 2017-02-03 DIAGNOSIS — E039 Hypothyroidism, unspecified: Secondary | ICD-10-CM | POA: Diagnosis present

## 2017-02-03 DIAGNOSIS — E1121 Type 2 diabetes mellitus with diabetic nephropathy: Secondary | ICD-10-CM | POA: Diagnosis present

## 2017-02-03 DIAGNOSIS — Z85828 Personal history of other malignant neoplasm of skin: Secondary | ICD-10-CM

## 2017-02-03 DIAGNOSIS — I2510 Atherosclerotic heart disease of native coronary artery without angina pectoris: Secondary | ICD-10-CM | POA: Diagnosis present

## 2017-02-03 DIAGNOSIS — D631 Anemia in chronic kidney disease: Secondary | ICD-10-CM | POA: Diagnosis present

## 2017-02-03 DIAGNOSIS — Z7952 Long term (current) use of systemic steroids: Secondary | ICD-10-CM

## 2017-02-03 DIAGNOSIS — F0390 Unspecified dementia without behavioral disturbance: Secondary | ICD-10-CM | POA: Diagnosis present

## 2017-02-03 DIAGNOSIS — B9620 Unspecified Escherichia coli [E. coli] as the cause of diseases classified elsewhere: Secondary | ICD-10-CM | POA: Diagnosis present

## 2017-02-03 DIAGNOSIS — I10 Essential (primary) hypertension: Secondary | ICD-10-CM | POA: Diagnosis present

## 2017-02-03 DIAGNOSIS — G9341 Metabolic encephalopathy: Secondary | ICD-10-CM | POA: Diagnosis not present

## 2017-02-03 DIAGNOSIS — Z7982 Long term (current) use of aspirin: Secondary | ICD-10-CM

## 2017-02-03 DIAGNOSIS — Z9115 Patient's noncompliance with renal dialysis: Secondary | ICD-10-CM

## 2017-02-03 DIAGNOSIS — I120 Hypertensive chronic kidney disease with stage 5 chronic kidney disease or end stage renal disease: Secondary | ICD-10-CM | POA: Diagnosis present

## 2017-02-03 DIAGNOSIS — E8770 Fluid overload, unspecified: Principal | ICD-10-CM | POA: Diagnosis present

## 2017-02-03 DIAGNOSIS — I252 Old myocardial infarction: Secondary | ICD-10-CM

## 2017-02-03 DIAGNOSIS — N2889 Other specified disorders of kidney and ureter: Secondary | ICD-10-CM

## 2017-02-03 DIAGNOSIS — E7800 Pure hypercholesterolemia, unspecified: Secondary | ICD-10-CM | POA: Diagnosis present

## 2017-02-03 DIAGNOSIS — E1129 Type 2 diabetes mellitus with other diabetic kidney complication: Secondary | ICD-10-CM | POA: Diagnosis present

## 2017-02-03 DIAGNOSIS — E8779 Other fluid overload: Secondary | ICD-10-CM

## 2017-02-03 DIAGNOSIS — N390 Urinary tract infection, site not specified: Secondary | ICD-10-CM | POA: Diagnosis present

## 2017-02-03 DIAGNOSIS — Z8673 Personal history of transient ischemic attack (TIA), and cerebral infarction without residual deficits: Secondary | ICD-10-CM

## 2017-02-03 DIAGNOSIS — E785 Hyperlipidemia, unspecified: Secondary | ICD-10-CM | POA: Diagnosis present

## 2017-02-03 DIAGNOSIS — E1122 Type 2 diabetes mellitus with diabetic chronic kidney disease: Secondary | ICD-10-CM | POA: Diagnosis present

## 2017-02-03 DIAGNOSIS — G92 Toxic encephalopathy: Secondary | ICD-10-CM | POA: Diagnosis present

## 2017-02-03 LAB — URINALYSIS, ROUTINE W REFLEX MICROSCOPIC
BACTERIA UA: NONE SEEN
BILIRUBIN URINE: NEGATIVE
Glucose, UA: NEGATIVE mg/dL
Ketones, ur: NEGATIVE mg/dL
NITRITE: NEGATIVE
PH: 8 (ref 5.0–8.0)
Protein, ur: 100 mg/dL — AB
Specific Gravity, Urine: 1.013 (ref 1.005–1.030)

## 2017-02-03 LAB — CBC
HCT: 35.5 % — ABNORMAL LOW (ref 39.0–52.0)
Hemoglobin: 11.4 g/dL — ABNORMAL LOW (ref 13.0–17.0)
MCH: 31.6 pg (ref 26.0–34.0)
MCHC: 32.1 g/dL (ref 30.0–36.0)
MCV: 98.3 fL (ref 78.0–100.0)
PLATELETS: 300 10*3/uL (ref 150–400)
RBC: 3.61 MIL/uL — ABNORMAL LOW (ref 4.22–5.81)
RDW: 15.3 % (ref 11.5–15.5)
WBC: 11.2 10*3/uL — AB (ref 4.0–10.5)

## 2017-02-03 LAB — DIFFERENTIAL
BASOS ABS: 0 10*3/uL (ref 0.0–0.1)
BASOS PCT: 0 %
EOS ABS: 0.1 10*3/uL (ref 0.0–0.7)
Eosinophils Relative: 1 %
Lymphocytes Relative: 21 %
Lymphs Abs: 2.3 10*3/uL (ref 0.7–4.0)
MONOS PCT: 6 %
Monocytes Absolute: 0.7 10*3/uL (ref 0.1–1.0)
NEUTROS PCT: 72 %
Neutro Abs: 8.1 10*3/uL — ABNORMAL HIGH (ref 1.7–7.7)

## 2017-02-03 LAB — GLUCOSE, CAPILLARY: GLUCOSE-CAPILLARY: 149 mg/dL — AB (ref 65–99)

## 2017-02-03 LAB — COMPREHENSIVE METABOLIC PANEL
ALBUMIN: 3.2 g/dL — AB (ref 3.5–5.0)
ALT: 16 U/L — AB (ref 17–63)
AST: 17 U/L (ref 15–41)
Alkaline Phosphatase: 122 U/L (ref 38–126)
Anion gap: 14 (ref 5–15)
BILIRUBIN TOTAL: 0.8 mg/dL (ref 0.3–1.2)
BUN: 57 mg/dL — AB (ref 6–20)
CHLORIDE: 93 mmol/L — AB (ref 101–111)
CO2: 30 mmol/L (ref 22–32)
CREATININE: 8.19 mg/dL — AB (ref 0.61–1.24)
Calcium: 8.8 mg/dL — ABNORMAL LOW (ref 8.9–10.3)
GFR calc Af Amer: 6 mL/min — ABNORMAL LOW (ref >60)
GFR calc non Af Amer: 6 mL/min — ABNORMAL LOW (ref >60)
Glucose, Bld: 146 mg/dL — ABNORMAL HIGH (ref 65–99)
POTASSIUM: 5.1 mmol/L (ref 3.5–5.1)
Sodium: 137 mmol/L (ref 135–145)
TOTAL PROTEIN: 5.4 g/dL — AB (ref 6.5–8.1)

## 2017-02-03 LAB — APTT: APTT: 35 s (ref 24–36)

## 2017-02-03 LAB — PROTIME-INR
INR: 1.02
PROTHROMBIN TIME: 13.4 s (ref 11.4–15.2)

## 2017-02-03 LAB — RAPID URINE DRUG SCREEN, HOSP PERFORMED
Amphetamines: NOT DETECTED
Barbiturates: NOT DETECTED
Benzodiazepines: NOT DETECTED
Cocaine: NOT DETECTED
OPIATES: NOT DETECTED
TETRAHYDROCANNABINOL: NOT DETECTED

## 2017-02-03 LAB — CBG MONITORING, ED: Glucose-Capillary: 162 mg/dL — ABNORMAL HIGH (ref 65–99)

## 2017-02-03 LAB — ETHANOL

## 2017-02-03 LAB — I-STAT TROPONIN, ED: TROPONIN I, POC: 0.01 ng/mL (ref 0.00–0.08)

## 2017-02-03 LAB — MRSA PCR SCREENING: MRSA by PCR: NEGATIVE

## 2017-02-03 MED ORDER — CYCLOSPORINE 25 MG PO CAPS
75.0000 mg | ORAL_CAPSULE | Freq: Every morning | ORAL | Status: DC
  Administered 2017-02-04 – 2017-02-06 (×3): 75 mg via ORAL
  Filled 2017-02-03 (×3): qty 3

## 2017-02-03 MED ORDER — PRAVASTATIN SODIUM 20 MG PO TABS
10.0000 mg | ORAL_TABLET | Freq: Every day | ORAL | Status: DC
  Administered 2017-02-04 – 2017-02-05 (×2): 10 mg via ORAL
  Filled 2017-02-03 (×3): qty 1

## 2017-02-03 MED ORDER — SODIUM CHLORIDE 0.9% FLUSH
3.0000 mL | Freq: Two times a day (BID) | INTRAVENOUS | Status: DC
  Administered 2017-02-04 – 2017-02-05 (×3): 3 mL via INTRAVENOUS

## 2017-02-03 MED ORDER — LEVOTHYROXINE SODIUM 75 MCG PO TABS
75.0000 ug | ORAL_TABLET | Freq: Every day | ORAL | Status: DC
  Administered 2017-02-04 – 2017-02-06 (×3): 75 ug via ORAL
  Filled 2017-02-03 (×3): qty 1

## 2017-02-03 MED ORDER — METOPROLOL TARTRATE 50 MG PO TABS
50.0000 mg | ORAL_TABLET | Freq: Two times a day (BID) | ORAL | Status: DC
  Administered 2017-02-03 – 2017-02-05 (×4): 50 mg via ORAL
  Filled 2017-02-03 (×4): qty 1

## 2017-02-03 MED ORDER — DONEPEZIL HCL 10 MG PO TABS
10.0000 mg | ORAL_TABLET | Freq: Every day | ORAL | Status: DC
  Administered 2017-02-03 – 2017-02-05 (×3): 10 mg via ORAL
  Filled 2017-02-03 (×4): qty 1

## 2017-02-03 MED ORDER — ACETAMINOPHEN 325 MG PO TABS
650.0000 mg | ORAL_TABLET | Freq: Four times a day (QID) | ORAL | Status: DC | PRN
  Administered 2017-02-03 – 2017-02-04 (×2): 650 mg via ORAL
  Filled 2017-02-03 (×4): qty 2

## 2017-02-03 MED ORDER — INSULIN ASPART 100 UNIT/ML ~~LOC~~ SOLN: 0.0000 [IU] | Freq: Every day | SUBCUTANEOUS | Status: DC

## 2017-02-03 MED ORDER — ASPIRIN EC 81 MG PO TBEC
81.0000 mg | DELAYED_RELEASE_TABLET | Freq: Every day | ORAL | Status: DC
  Administered 2017-02-03 – 2017-02-05 (×3): 81 mg via ORAL
  Filled 2017-02-03 (×3): qty 1

## 2017-02-03 MED ORDER — HEPARIN SODIUM (PORCINE) 5000 UNIT/ML IJ SOLN
5000.0000 [IU] | Freq: Three times a day (TID) | INTRAMUSCULAR | Status: DC
  Administered 2017-02-03 – 2017-02-06 (×8): 5000 [IU] via SUBCUTANEOUS
  Filled 2017-02-03 (×5): qty 1

## 2017-02-03 MED ORDER — CYCLOSPORINE 25 MG PO CAPS
50.0000 mg | ORAL_CAPSULE | Freq: Every day | ORAL | Status: DC
  Administered 2017-02-04 – 2017-02-05 (×2): 50 mg via ORAL
  Filled 2017-02-03 (×4): qty 2

## 2017-02-03 MED ORDER — MYCOPHENOLATE MOFETIL 250 MG PO CAPS
500.0000 mg | ORAL_CAPSULE | Freq: Every day | ORAL | Status: DC
  Administered 2017-02-04 – 2017-02-06 (×3): 500 mg via ORAL
  Filled 2017-02-03 (×3): qty 2

## 2017-02-03 MED ORDER — RENA-VITE PO TABS
1.0000 | ORAL_TABLET | Freq: Every day | ORAL | Status: DC
  Administered 2017-02-04 – 2017-02-06 (×3): 1 via ORAL
  Filled 2017-02-03 (×3): qty 1

## 2017-02-03 MED ORDER — DEXTROSE 5 % IV SOLN
1.0000 g | INTRAVENOUS | Status: DC
  Administered 2017-02-04 (×2): 1 g via INTRAVENOUS
  Filled 2017-02-03 (×3): qty 10

## 2017-02-03 MED ORDER — INSULIN ASPART 100 UNIT/ML ~~LOC~~ SOLN
0.0000 [IU] | Freq: Three times a day (TID) | SUBCUTANEOUS | Status: DC
  Administered 2017-02-05 (×2): 2 [IU] via SUBCUTANEOUS

## 2017-02-03 MED ORDER — MYCOPHENOLATE MOFETIL 250 MG PO CAPS: 250.0000 mg | ORAL_CAPSULE | Freq: Every day | ORAL | Status: DC

## 2017-02-03 MED ORDER — CYCLOSPORINE MODIFIED (NEORAL) 25 MG PO CAPS: 50.0000 mg | ORAL_CAPSULE | Freq: Two times a day (BID) | ORAL | Status: DC

## 2017-02-03 MED ORDER — VITAMIN D 1000 UNITS PO TABS
1000.0000 [IU] | ORAL_TABLET | Freq: Every day | ORAL | Status: DC
  Administered 2017-02-04 – 2017-02-06 (×3): 1000 [IU] via ORAL
  Filled 2017-02-03 (×3): qty 1

## 2017-02-03 MED ORDER — FLUOXETINE HCL 20 MG PO CAPS
20.0000 mg | ORAL_CAPSULE | Freq: Every day | ORAL | Status: DC
  Administered 2017-02-04 – 2017-02-06 (×3): 20 mg via ORAL
  Filled 2017-02-03 (×3): qty 1

## 2017-02-03 MED ORDER — CALCIUM ACETATE (PHOS BINDER) 667 MG PO CAPS
667.0000 mg | ORAL_CAPSULE | Freq: Three times a day (TID) | ORAL | Status: DC
Start: 2017-02-04 — End: 2017-02-07
  Administered 2017-02-04 – 2017-02-06 (×6): 667 mg via ORAL
  Filled 2017-02-03 (×5): qty 1

## 2017-02-03 MED ORDER — MYCOPHENOLATE MOFETIL 250 MG PO CAPS
250.0000 mg | ORAL_CAPSULE | ORAL | Status: DC
  Administered 2017-02-06: 250 mg via ORAL
  Filled 2017-02-03: qty 1

## 2017-02-03 MED ORDER — ACETAMINOPHEN 650 MG RE SUPP
650.0000 mg | Freq: Four times a day (QID) | RECTAL | Status: DC | PRN
Start: 2017-02-03 — End: 2017-02-07

## 2017-02-03 MED ORDER — PANTOPRAZOLE SODIUM 40 MG PO TBEC
40.0000 mg | DELAYED_RELEASE_TABLET | Freq: Every day | ORAL | Status: DC
  Administered 2017-02-04 – 2017-02-06 (×3): 40 mg via ORAL
  Filled 2017-02-03 (×4): qty 1

## 2017-02-03 MED ORDER — ALBUTEROL SULFATE (2.5 MG/3ML) 0.083% IN NEBU: 2.5000 mg | INHALATION_SOLUTION | Freq: Four times a day (QID) | RESPIRATORY_TRACT | Status: DC | PRN

## 2017-02-03 NOTE — Progress Notes (Addendum)
Background: Pt is 76 year old WM with hx ESRD on TTS HD (last 3/13), heart transplant 2001, hx of progressive weakness, SOB, anorexia the past several weeks.  His family reports he is increasingly confused and needs assistance with tasks.  He is able to feed himself and recognize family.  His son feels his speech is a little "slurry".  Pt missed HD Tuesday (he can't say why -his wife says he refused).  He was so weak this am, that his son had to come over to help get him up.  His last dialysis was 3/13.  He ran 2 hr and 24 minutes with a net UF of 1.6 and left 0.6 above EDW.  Pre HD BP was 209/99 and post 156/79. Pt's family said he fell yesterday, but the patient has no recollection of this  s:  Pt has no sig compliants: o:    Temp Readings from Last 3 Encounters:  02/03/17 97.7 F (36.5 C)  11/09/16 97.5 F (36.4 C)  09/06/16 97.7 F (36.5 C) (Oral)   BP Readings from Last 3 Encounters:  02/03/17 (!) 270/110  11/09/16 (!) 223/79  09/06/16 (!) 178/72   Pulse Readings from Last 3 Encounters:  02/03/17 69  11/09/16 75  09/06/16 67    Labs: hgb 11.4 WBC 11.2 normal diff  Trop 0.01 - CMP redrawn pending, CXR:  Opacification of right mid to lower lung with fluid in fissures Head and Neck CT : no acute findings - resolved subdural  Gen: elderly WM NAD - coughs with deep insp Heart: RRR Lungs: ^^crackles right >L lung Abd: soft NTND Ext: no LE edema or wounds Access: left AVF + bruit  A/P: 1) weakness - ^ volume /labs pending/ urine ? UTI rule out other source - to be sent for culture - discussed with ED PA - for probably OBS admission         2) volume excess due to missed HD and unintentional weight loss - goal set for 4 L today and reassess for new EDW post HD - doubt he can stand so we will try to get an accurate bed weight - needs CXR in am post HD        3)  HTN - should improve with decreased volume  Amalia Hailey, PA-C [facility], [place] [phone]  I  have personally seen and examined this patient and agree with the assessment/plan as outlined above by Marva Panda. Will order for hemodialysis today for volume unloading and clearance. Discussed need for optimal adherence with HD. Greidy Sherard K.,MD 02/03/2017 4:33 PM

## 2017-02-03 NOTE — ED Provider Notes (Signed)
[facility] DEPT Provider Note   CSN: [id number] Arrival date & time: 02/03/17  1253     History   Chief Complaint Chief Complaint  Patient presents with  . Weakness    HPI [name] [name] is a 76 y.o. 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  She reports he is simply here for dialysis today and is without complaint. No family at bedside.  Per EMS, patient lives at home with his wife and children. They report he had 2 weeks of lack of appetite and generalized weakness. He missed his dialysis on 02/01/2017 due to weakness. Patient's daughter-in-law told EMS that the patient fell yesterday but patient does not remember this.      Level 5 Caveat for AMS.    Family at bedside now reports decreased by mouth intake both fluid and solids. They deny infectious symptoms. No changes in urine output. Patient had UTI several months ago and was treated. No recent antibiotics.   The history is provided by the patient and medical records. No language interpreter was used.    Past Medical History:  Diagnosis Date  . Anemia   . Cancer ([place])    SKIN  . CKD III-IV    a. since transplant in 2001 with significant worsening since 06/2013.  Marland Kitchen Coronary artery disease   . Dementia    mild  . Diabetes mellitus without complication (HCC)    FASTING CBG 90S  . Headache(784.0)   . 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 1995 CABG x 4 in [place], [place];  b. 2001 Developed CHF;  c. 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 10/2000 Cardiac transplant @ [facility], [place];  e. Historically nl Bx and stress testing.  Last stress test ~ 5 yrs ago.  Marland Kitchen Hernia of abdominal wall   . History of blood transfusion   . History of stroke   . Hypercalcemia     a. 06/2013 Hospitalized in [place].  Marland Kitchen Hyperlipidemia   . Hypertension    a. Long h/o HTN, came off of all meds following transplant/wt loss-->meds resumed 06/2013, difficult to control since.  . Myocardial infarction    PRIOR TO HEART TRANSPLANT  . Orthostatic dizziness   . Orthostatic hypotension    a. Has tried support hose and compression stockings - prefers not to wear.  . Shortness of breath    EXERTION  . Stroke ([place]) 06/2013   NO RESIDUAL - WAS CONFUSED AT TIME - THAT HAS RESOLVED    Patient Active Problem List   Diagnosis Date Noted  . Generalized weakness 08/22/2016  . Urinary tract infection, site not specified   . Syncope and collapse   . Syncope 08/07/2016  . Faintness   . Uncontrolled hypertension   . Acute blood loss anemia   . Cardiomyopathy ([place])   . Controlled diabetes mellitus type 2 with complications ([place])   . 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 Traumatic subdural hematoma ([place]) 05/04/2016  . 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 Subdural hematoma ([place]) 05/02/2016  . CKD (chronic kidney disease)   . Protein-calorie malnutrition, severe ([place]) 04/23/2015  . Anemia of chronic disease 04/23/2015  . Depression 04/23/2015  . UTI (urinary tract infection) 04/22/2015  . Barrett's esophagus 03/22/2015  .  ESRD on dialysis ([place]) 10/12/2014  . DM (diabetes mellitus), type 2 with renal complications ([place]) 37/29/0211  . Hypothyroidism 10/06/2014  . Heart transplanted ([place])   . Hypercholesterolemia 06/20/2014  . 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 BASCILIC VEIN TRANSPOSITION Left 07/06/2014   Procedure: [place];  Surgeon: Rosetta Posner, MD;  Location: [facility];  Service: Vascular;  Laterality: Left;  . CARDIAC SURGERY    . CATARACT EXTRACTION    . CORONARY ARTERY BYPASS GRAFT    . CRANIOTOMY Right 05/04/2016   Procedure: Right  Craniotomy for Evacuation of Subdural Hematoma;  Surgeon: Earnie Larsson, MD;  Location: [facility] NEURO ORS;  Service: Neurosurgery;  Laterality: Right;  . EYE SURGERY Bilateral    CATARACTS  . HEART TRANSPLANT    . PERIPHERAL VASCULAR CATHETERIZATION N/A 09/06/2016   Procedure: Fistulagram - left arm;  Surgeon: Waynetta Sandy, MD;  Location: [facility] CV LAB;  Service: Cardiovascular;  Laterality: N/A;  . PERIPHERAL VASCULAR CATHETERIZATION Left 09/06/2016   Procedure: Peripheral Vascular Intervention;  Surgeon: Waynetta Sandy, MD;  Location: [facility] CV LAB;  Service: Cardiovascular;  Laterality: Left;  UPPER ARM COVERED STENT  . SKIN GRAFT FULL THICKNESS LEG     cat scratch that did not heal  . VASECTOMY         Home Medications    Prior to Admission medications   Medication Sig Start Date End Date Taking? Authorizing Provider  acetaminophen (TYLENOL) 325 MG tablet Take 2 tablets (650 mg total) by mouth every 6 (six) hours as needed for mild pain (or Fever >/= 101). Patient taking differently: Take 650 mg by mouth every 6 (six) hours as needed for mild pain.  10/14/14   Delfina Redwood, MD  albuterol (PROVENTIL HFA;VENTOLIN HFA) 108 (90 Base) MCG/ACT inhaler Inhale 2 puffs into the lungs every 4 (four) hours as needed for wheezing or shortness of breath. 11/09/16   Kristen N Ward, DO  albuterol (PROVENTIL) (2.5 MG/3ML) 0.083% nebulizer solution Take 3 mLs (2.5 mg total) by nebulization every 6 (six) hours as needed for wheezing or shortness of breath. 11/09/16   Kristen N Ward, DO  aspirin EC 81 MG tablet Take 81 mg by mouth at bedtime.    Historical Provider, MD  calcium acetate (PHOSLO) 667 MG capsule Take 1 capsule (667 mg total) by mouth 3 (three) times daily with meals. Patient taking differently: Take 667 mg by mouth every morning.  08/24/16   Shanker Kristeen Mans, MD  cholecalciferol (VITAMIN D) 1000 UNITS tablet Take 1,000 Units by mouth daily.    Historical  Provider, MD  cycloSPORINE modified (NEORAL) 25 MG capsule Take 50-75 mg by mouth 2 (two) times daily. 75 mg in the morning and 50 mg in the evening 08/18/14   Lelon Perla, MD  diphenoxylate-atropine (LOMOTIL) 2.5-0.025 MG tablet Take 1 tablet by mouth 4 (four) times daily as needed for diarrhea or loose stools. 08/24/16   Shanker Kristeen Mans, MD  donepezil (ARICEPT) 10 MG tablet Take 10 mg by mouth at bedtime.  03/01/15   Historical Provider, MD  FLUoxetine (PROZAC) 20 MG capsule Take 20 mg by mouth daily.     Historical Provider, MD  fluticasone (FLONASE) 50 MCG/ACT nasal spray Place 2 sprays into both nostrils at bedtime as needed for allergies.  04/12/16   Historical Provider, MD  lansoprazole (PREVACID) 30 MG capsule Take 30 mg by  mouth See admin instructions. Take 1 capsule (30 mg) by mouth twice daily on Sunday, Monday, Wednesday, Friday (non-dialysis days) 12/22/15   Historical Provider, MD  levothyroxine (SYNTHROID, LEVOTHROID) 75 MCG tablet Take 75 mcg by mouth daily.     Historical Provider, MD  metoprolol tartrate (LOPRESSOR) 25 MG tablet Take 2 tablets (50 mg total) by mouth 2 (two) times daily. 12/27/14   Ripudeep Krystal Eaton, MD  multivitamin (RENA-VIT) TABS tablet Take 1 tablet by mouth daily.  03/08/15   Historical Provider, MD  mycophenolate (CELLCEPT) 250 MG capsule Take 250-500 mg by mouth daily. Tuesday Thursday and Saturday only take evening dose of 250 mg.  morning dose of 500 mg    Historical Provider, MD  pravastatin (PRAVACHOL) 10 MG tablet Take 10 mg by mouth at bedtime.     Historical Provider, MD  predniSONE (DELTASONE) 20 MG tablet Take 3 tablets (60 mg total) by mouth daily. 11/09/16   Kristen N Ward, DO  Probiotic Product (PRO-BIOTIC BLEND) CAPS Take 1 capsule by mouth daily.    Historical Provider, MD  vitamin B-12 (CYANOCOBALAMIN) 1000 MCG tablet Take 1,000 mcg by mouth daily with breakfast.    Historical Provider, MD    Family History Family History  Problem Relation Age of  Onset  . Heart attack Father     died @ 85  . Diabetes Father   . Heart attack Mother   . Diabetes Mother   . Kidney disease Maternal Aunt     Social History Social History  Substance Use Topics  . Smoking status: Never Smoker  . Smokeless tobacco: Never Used  . Alcohol use No     Allergies   Norvasc [amlodipine besylate]; Ace inhibitors; and Angiotensin receptor blockers   Review of Systems Review of Systems  Unable to perform ROS: Mental status change     Physical Exam Updated Vital Signs BP (!) 270/110 (BP Location: Right Arm)   Pulse 69   Temp 97.7 F (36.5 C)   Resp 16   SpO2 98%   Physical Exam  Constitutional: He appears well-developed and well-nourished. No distress.  Awake, alert, nontoxic appearance  HENT:  Head: Normocephalic and atraumatic.  Mouth/Throat: Oropharynx is clear and moist. No oropharyngeal exudate.  No abrasions or contusions  Eyes: Conjunctivae are normal. No scleral icterus.  Neck: Normal range of motion. Neck supple.  No midline or paraspinal tenderness No deformities  Cardiovascular: Normal rate, regular rhythm and intact distal pulses.   Murmur heard. Pulmonary/Chest: Effort normal. No respiratory distress. He has no wheezes. He has rales in the right lower field and the left lower field.  Equal chest expansion  Abdominal: Soft. Bowel sounds are normal. He exhibits no mass. There is no tenderness. There is no rebound and no guarding.  Musculoskeletal: Normal range of motion. He exhibits no edema.  Neurological: He is alert.  Speech is clear and goal oriented Moves extremities without ataxia 5/5 in the bilateral upper and lower extremities. No pronator drift. Sensation intact to soft touch the bilateral upper and lower extremities.  Skin: Skin is warm and dry. He is not diaphoretic.  Open wound to the right forearm  Psychiatric: He has a normal mood and affect.  Nursing note and vitals reviewed.    ED Treatments / Results    Labs (all labs ordered are listed, but only abnormal results are displayed) Labs Reviewed  CBC - Abnormal; Notable for the following:       Result Value  WBC 11.2 (*)    RBC 3.61 (*)    Hemoglobin 11.4 (*)    HCT 35.5 (*)    All other components within normal limits  DIFFERENTIAL - Abnormal; Notable for the following:    Neutro Abs 8.1 (*)    All other components within normal limits  URINALYSIS, ROUTINE W REFLEX MICROSCOPIC - Abnormal; Notable for the following:    APPearance CLOUDY (*)    Hgb urine dipstick MODERATE (*)    Protein, ur 100 (*)    Leukocytes, UA LARGE (*)    Squamous Epithelial / LPF 0-5 (*)    All other components within normal limits  CBG MONITORING, ED - Abnormal; Notable for the following:    Glucose-Capillary 162 (*)    All other components within normal limits  ETHANOL  PROTIME-INR  APTT  RAPID URINE DRUG SCREEN, HOSP PERFORMED  COMPREHENSIVE METABOLIC PANEL  I-STAT TROPOININ, ED    EKG  EKG Interpretation  Date/Time:  Saturday February 03 2017 12:59:55 EDT Ventricular Rate:  69 PR Interval:    QRS Duration: 109 QT Interval:  465 QTC Calculation: 499 R Axis:   91 Text Interpretation:  Sinus rhythm Right axis deviation RSR' in V1 or V2, probably normal variant Consider left ventricular hypertrophy Borderline prolonged QT interval No significant change since last tracing Confirmed by [facility] MD, ERIN ([id number]) on 02/03/2017 1:03:04 PM       Radiology Dg Chest 2 View  Result Date: 02/03/2017 CLINICAL DATA:  Weakness with mid chest pain and right neck pain several weeks. EXAM: CHEST  2 VIEW COMPARISON:  11/09/2016 scan 10/05/2014 as well as chest CT 10/05/2014 FINDINGS: Sternotomy wires unchanged. Lungs are adequately inflated with opacification over the right mid to lower lung over the region of the major fissure. Also minimal hazy opacification in the left suprahilar region. Findings are new compared to 11/09/2016, although not significantly  changed from 10/05/2014. Cardiomediastinal silhouette and remainder of the exam is unchanged. IMPRESSION: Opacification over the right mid to lower lung centered over the major fissure with mild opacification the left suprahilar region. Right lung findings may represent fluid within the fissure. Cannot exclude atelectasis versus infection over the left suprahilar region. Consider noncontrast chest CT for further evaluation. Electronically Signed   By: Marin Olp M.D.   On: 02/03/2017 14:11   Ct Head Wo Contrast  Result Date: 02/03/2017 CLINICAL DATA:  Generalized weakness. Fell yesterday and hit the right side of his head on the floor. Right neck pain. Previous brain surgery. EXAM: CT HEAD WITHOUT CONTRAST CT CERVICAL SPINE WITHOUT CONTRAST TECHNIQUE: Multidetector CT imaging of the head and cervical spine was performed following the standard protocol without intravenous contrast. Multiplanar CT image reconstructions of the cervical spine were also generated. COMPARISON:  Head and cervical spine CT dated 06/27/2016 and brain MR dated 08/09/2016. FINDINGS: CT HEAD FINDINGS Brain: Diffusely enlarged ventricles and subarachnoid spaces. Patchy white matter low density in both cerebral hemispheres and in the pons without significant change. No intracranial hemorrhage, mass lesion or CT evidence of acute infarction. Vascular: No hyperdense vessel or unexpected calcification. Skull: Stable post craniotomy changes on the right. Stable right burr hole. No fracture. Sinuses/Orbits: No acute finding. Other: None. CT CERVICAL SPINE FINDINGS Alignment: Normal. Skull base and vertebrae: No acute fracture. No primary bone lesion or focal pathologic process. Soft tissues and spinal canal: No prevertebral fluid or swelling. No visible canal hematoma. Disc levels:  Stable multilevel degenerative changes. Upper chest: Right apical bullous changes.  Right pleural effusion. Other: Bilateral dense carotid artery calcifications.  IMPRESSION: 1. No acute abnormality. 2. Resolved right lateral subdural hematoma. 3. Mildly progressive atrophy and chronic small vessel white matter ischemic changes. 4. Cervical spine degenerative changes. 5. Dense bilateral carotid artery atheromatous calcifications. 6. Right pleural effusion. 7. COPD. Electronically Signed   By: Claudie Revering M.D.   On: 02/03/2017 15:24   Ct Cervical Spine Wo Contrast  Result Date: 02/03/2017 CLINICAL DATA:  Generalized weakness. Fell yesterday and hit the right side of his head on the floor. Right neck pain. Previous brain surgery. EXAM: CT HEAD WITHOUT CONTRAST CT CERVICAL SPINE WITHOUT CONTRAST TECHNIQUE: Multidetector CT imaging of the head and cervical spine was performed following the standard protocol without intravenous contrast. Multiplanar CT image reconstructions of the cervical spine were also generated. COMPARISON:  Head and cervical spine CT dated 06/27/2016 and brain MR dated 08/09/2016. FINDINGS: CT HEAD FINDINGS Brain: Diffusely enlarged ventricles and subarachnoid spaces. Patchy white matter low density in both cerebral hemispheres and in the pons without significant change. No intracranial hemorrhage, mass lesion or CT evidence of acute infarction. Vascular: No hyperdense vessel or unexpected calcification. Skull: Stable post craniotomy changes on the right. Stable right burr hole. No fracture. Sinuses/Orbits: No acute finding. Other: None. CT CERVICAL SPINE FINDINGS Alignment: Normal. Skull base and vertebrae: No acute fracture. No primary bone lesion or focal pathologic process. Soft tissues and spinal canal: No prevertebral fluid or swelling. No visible canal hematoma. Disc levels:  Stable multilevel degenerative changes. Upper chest: Right apical bullous changes.  Right pleural effusion. Other: Bilateral dense carotid artery calcifications. IMPRESSION: 1. No acute abnormality. 2. Resolved right lateral subdural hematoma. 3. Mildly progressive atrophy  and chronic small vessel white matter ischemic changes. 4. Cervical spine degenerative changes. 5. Dense bilateral carotid artery atheromatous calcifications. 6. Right pleural effusion. 7. COPD. Electronically Signed   By: Claudie Revering M.D.   On: 02/03/2017 15:24    Procedures Procedures (including critical care time)  Medications Ordered in ED Medications - No data to display   Initial Impression / Assessment and Plan / ED Course  I have reviewed the triage vital signs and the nursing notes.  Pertinent labs & imaging results that were available during my care of the patient were reviewed by me and considered in my medical decision making (see chart for details).  Clinical Course as of Feb 03 1641  Sat Feb 03, 2017  1642 Discuss with triad who will admit.  [HM]    Clinical Course User Index [HM] Jarrett Soho Analayah Brooke, PA-C     CT head and neck without acute abnormalities. CBC without leukocytosis. UA with concerns for possible UTI. Urine culture sent. Chest x-ray with evidence of fluid overload in conjunction with clinical exam.  Chemistries pending.  Patient significantly hypertensive here in the emergency department a chest pain or shortness of breath. Suspect this is secondary to lack of dialysis. Patient previously with hypertension prior to dialysis. Troponin negative. Record review shows a history of difficult to control hypertension.  Patient evaluated by nephrology who will dialyze patient today.  Also discussed with triad who will admit for further evaluation of weakness.  The patient was discussed with and seen by Dr. Billy Fischer who agrees with the treatment plan.   Final Clinical Impressions(s) / ED Diagnoses   Final diagnoses:  Weakness  Hypertension secondary to other renal disorders  Other hypervolemia    New Prescriptions New Prescriptions   No medications on file  Jarrett Soho Sangeeta Youse, PA-C 02/03/17 [place], MD 02/04/17 2320

## 2017-02-03 NOTE — ED Notes (Signed)
Lab called CMP hemolyzed, lab will d/c present order, nurse needs to reorder CMP.

## 2017-02-03 NOTE — ED Triage Notes (Signed)
To room via EMS.  Lives home with wife and children.  Onset 2 weeks lack of appetitie and weak.  Missed dialysis 02-01-17 and today because didn't feel well enough to go.  Pt fell yesterday, doesn't remember, daughter in law told pt he fell.   Pt c/o head pain earlier today (from previous brain surgery).  No pain at this time.

## 2017-02-03 NOTE — ED Notes (Signed)
Patient transported to X-ray 

## 2017-02-03 NOTE — H&P (Signed)
History and Physical    [name] [name] XBJ:[id number] DOB: [birth year] DOA: 02/03/2017  PCP: Junie Panning, NP  Patient coming from: Home  Chief Complaint: Confusion  HPI: [name] [name] is a 76 y.o. male with medical history significant of ESRD on TTS HD, DM2, hx heart transplant, poorly controlled HTN, HLD who presented to the ED with comlaints of 2 weeks of generalized weakness and worsening confusion. Patient reportedly missed scheduled HD session on 3/15. On further questioning, patient reports feeling generally weak mainly around times of HD. Reports currently working with outpatient PT. Also reports dysuria, claims for past 38yrs  ED Course: In the ED, patient was noted to be volume overloaded. CXR was notable for findings suggestive of volume overload. Cr noted to be 8.19. K noted to be 5.1. SBP noted to be poorly controlled with most current bp of 270/110. Nephrology was consulted with plans for HD. Hospitalist consulted for consideration for admission.  Review of Systems:  Review of Systems  Constitutional: Positive for malaise/fatigue. Negative for chills and weight loss.  HENT: Negative for ear discharge, ear pain, sinus pain and tinnitus.   Eyes: Negative for double vision, photophobia, pain and discharge.  Respiratory: Negative for hemoptysis, sputum production and shortness of breath.   Cardiovascular: Negative for palpitations, orthopnea and claudication.  Gastrointestinal: Negative for abdominal pain, nausea and vomiting.  Genitourinary: Positive for dysuria. Negative for frequency and urgency.  Musculoskeletal: Negative for back pain, joint pain and neck pain.  Neurological: Positive for weakness. Negative for tingling, tremors, seizures and loss of consciousness.  Psychiatric/Behavioral: Negative for hallucinations and memory loss. The patient is not nervous/anxious.     Past Medical History:  Diagnosis Date  . Anemia   . Cancer ([place])    SKIN  . CKD III-IV     a. since transplant in 2001 with significant worsening since 06/2013.  Marland Kitchen Coronary artery disease   . Dementia    mild  . Diabetes mellitus without complication (HCC)    FASTING CBG 90S  . Headache(784.0)   . Heart transplanted ([place])    a. 1995 CABG x 4 in [place], [place];  b. 2001 Developed CHF;  c. 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 10/2000 Cardiac transplant @ [facility], [place];  e. Historically nl Bx and stress testing.  Last stress test ~ 5 yrs ago.  Marland Kitchen Hernia of abdominal wall   . History of blood transfusion   . History of stroke   . Hypercalcemia    a. 06/2013 Hospitalized in [place].  Marland Kitchen Hyperlipidemia   . Hypertension    a. Long h/o HTN, came off of all meds following transplant/wt loss-->meds resumed 06/2013, difficult to control since.  . Myocardial infarction    PRIOR TO HEART TRANSPLANT  . Orthostatic dizziness   . Orthostatic hypotension    a. Has tried support hose and compression stockings - prefers not to wear.  . Shortness of breath    EXERTION  . Stroke ([place]) 06/2013   NO RESIDUAL - WAS CONFUSED AT TIME - THAT HAS RESOLVED    Past Surgical History:  Procedure Laterality Date  . BASCILIC VEIN TRANSPOSITION Left 07/06/2014   Procedure: [place];  Surgeon: Rosetta Posner, MD;  Location: [facility];  Service: Vascular;  Laterality: Left;  . CARDIAC SURGERY    . CATARACT EXTRACTION    . CORONARY ARTERY BYPASS GRAFT    . CRANIOTOMY Right 05/04/2016   Procedure: Right Craniotomy for Evacuation of Subdural Hematoma;  Surgeon:  Earnie Larsson, MD;  Location: [facility];  Service: Neurosurgery;  Laterality: Right;  . EYE SURGERY Bilateral    CATARACTS  . HEART TRANSPLANT    . PERIPHERAL VASCULAR CATHETERIZATION N/A 09/06/2016   Procedure: Fistulagram - left arm;  Surgeon: Waynetta Sandy, MD;  Location: [facility];  Service: Cardiovascular;  Laterality: N/A;  . PERIPHERAL VASCULAR CATHETERIZATION Left 09/06/2016   Procedure: Peripheral Vascular  Intervention;  Surgeon: Waynetta Sandy, MD;  Location: [facility];  Service: Cardiovascular;  Laterality: Left;  UPPER ARM COVERED STENT  . SKIN GRAFT FULL THICKNESS LEG     cat scratch that did not heal  . VASECTOMY       reports that he has never smoked. He has never used smokeless tobacco. He reports that he does not drink alcohol or use drugs.  Allergies  Allergen Reactions  . Norvasc [Amlodipine Besylate] Swelling and Other (See Comments)    Swelling of the lower body   . Ace Inhibitors Other (See Comments)    On dialysis - cannot take these meds  . Angiotensin Receptor Blockers Other (See Comments)    On dialysis - cannot take these meds    Family History  Problem Relation Age of Onset  . Heart attack Father     died @ 76  . Diabetes Father   . Heart attack Mother   . Diabetes Mother   . Kidney disease Maternal Aunt     Prior to Admission medications   Medication Sig Start Date End Date Taking? Authorizing Provider  acetaminophen (TYLENOL) 325 MG tablet Take 2 tablets (650 mg total) by mouth every 6 (six) hours as needed for mild pain (or Fever >/= 101). Patient taking differently: Take 650 mg by mouth every 6 (six) hours as needed for mild pain.  10/14/14  Yes Delfina Redwood, MD  albuterol (PROVENTIL) (2.5 MG/3ML) 0.083% nebulizer solution Take 3 mLs (2.5 mg total) by nebulization every 6 (six) hours as needed for wheezing or shortness of breath. 11/09/16  Yes Kristen N Ward, DO  aspirin EC 81 MG tablet Take 81 mg by mouth at bedtime.   Yes Historical Provider, MD  calcium acetate (PHOSLO) 667 MG capsule Take 1 capsule (667 mg total) by mouth 3 (three) times daily with meals. Patient taking differently: Take 667 mg by mouth every morning.  08/24/16  Yes Shanker Kristeen Mans, MD  cholecalciferol (VITAMIN D) 1000 UNITS tablet Take 1,000 Units by mouth daily.   Yes Historical Provider, MD  cycloSPORINE modified (NEORAL) 25 MG capsule Take 50-75 mg by mouth 2  (two) times daily. 75 mg in the morning and 50 mg in the evening 08/18/14  Yes Lelon Perla, MD  diphenoxylate-atropine (LOMOTIL) 2.5-0.025 MG tablet Take 1 tablet by mouth 4 (four) times daily as needed for diarrhea or loose stools. 08/24/16  Yes Shanker Kristeen Mans, MD  donepezil (ARICEPT) 10 MG tablet Take 10 mg by mouth at bedtime.  03/01/15  Yes Historical Provider, MD  FLUoxetine (PROZAC) 20 MG capsule Take 20 mg by mouth daily.    Yes Historical Provider, MD  fluticasone (FLONASE) 50 MCG/ACT nasal spray Place 2 sprays into both nostrils at bedtime as needed for allergies.  04/12/16  Yes Historical Provider, MD  lansoprazole (PREVACID) 30 MG capsule Take 30 mg by mouth See admin instructions. Take 1 capsule (30 mg) by mouth twice daily on Sunday, Monday, Wednesday, Friday (non-dialysis days) 12/22/15  Yes Historical Provider, MD  levothyroxine (SYNTHROID, LEVOTHROID) 75 MCG tablet Take 75 mcg by mouth daily.    Yes Historical Provider, MD  metoprolol tartrate (LOPRESSOR) 25 MG tablet Take 2 tablets (50 mg total) by mouth 2 (two) times daily. 12/27/14  Yes Ripudeep Krystal Eaton, MD  multivitamin (RENA-VIT) TABS tablet Take 1 tablet by mouth daily.  03/08/15  Yes Historical Provider, MD  mycophenolate (CELLCEPT) 250 MG capsule Take 250-500 mg by mouth daily. Tuesday Thursday and Saturday only take evening dose of 250 mg.  morning dose of 500 mg   Yes Historical Provider, MD  pravastatin (PRAVACHOL) 10 MG tablet Take 10 mg by mouth at bedtime.    Yes Historical Provider, MD  Probiotic Product (PRO-BIOTIC BLEND) CAPS Take 1 capsule by mouth daily.   Yes Historical Provider, MD  vitamin B-12 (CYANOCOBALAMIN) 1000 MCG tablet Take 1,000 mcg by mouth daily with breakfast.   Yes Historical Provider, MD  albuterol (PROVENTIL HFA;VENTOLIN HFA) 108 (90 Base) MCG/ACT inhaler Inhale 2 puffs into the lungs every 4 (four) hours as needed for wheezing or shortness of breath. Patient not taking: Reported on 02/03/2017 11/09/16    Kristen N Ward, DO  predniSONE (DELTASONE) 20 MG tablet Take 3 tablets (60 mg total) by mouth daily. Patient not taking: Reported on 02/03/2017 11/09/16   Delice Bison Ward, DO    Physical Exam: Vitals:   02/03/17 1312 02/03/17 1315 02/03/17 1425 02/03/17 1428  BP: (!) 257/96 (!) 252/99 (!) 270/110   Pulse: 73 69    Resp: 20 16    Temp: 98.5 F (36.9 C)   97.7 F (36.5 C)  TempSrc: Oral     SpO2: 99% 98%      Constitutional: NAD, calm, comfortable Vitals:   02/03/17 1312 02/03/17 1315 02/03/17 1425 02/03/17 1428  BP: (!) 257/96 (!) 252/99 (!) 270/110   Pulse: 73 69    Resp: 20 16    Temp: 98.5 F (36.9 C)   97.7 F (36.5 C)  TempSrc: Oral     SpO2: 99% 98%     Eyes: PERRL, lids and conjunctivae normal ENMT: Mucous membranes are moist. Posterior pharynx clear of any exudate or lesions.Normal dentition.  Neck: normal, supple, no masses, no thyromegaly Respiratory: clear to auscultation bilaterally, no wheezing, no crackles. Normal respiratory effort. No accessory muscle use.  Cardiovascular: Regular rate and rhythm, Abdomen: no tenderness, no masses palpated. No hepatosplenomegaly. Bowel sounds positive.  Musculoskeletal: no clubbing / cyanosis. No joint deformity upper and lower extremities. Good ROM, no contractures. Normal muscle tone.  Skin: no rashes, lesions, ulcers. No induration Neurologic: CN 2-12 grossly intact. Sensation intact, DTR normal. Strength 5/5 in all 4.  Psychiatric: Normal judgment and insight. Alert and oriented x 3. Normal mood.    Labs on Admission: I have personally reviewed following labs and imaging studies  CBC:  Recent Labs Lab 02/03/17 1436  WBC 11.2*  NEUTROABS 8.1*  HGB 11.4*  HCT 35.5*  MCV 98.3  PLT 315   Basic Metabolic Panel:  Recent Labs Lab 02/03/17 1532  NA 137  K 5.1  CL 93*  CO2 30  GLUCOSE 146*  BUN 57*  CREATININE 8.19*  CALCIUM 8.8*   GFR: CrCl cannot be calculated (Unknown ideal weight.). Liver Function  Tests:  Recent Labs Lab 02/03/17 1532  AST 17  ALT 16*  ALKPHOS 122  BILITOT 0.8  PROT 5.4*  ALBUMIN 3.2*   No results for input(s): LIPASE, AMYLASE in the last 168 hours. No results for input(s):  AMMONIA in the last 168 hours. Coagulation Profile:  Recent Labs Lab 02/03/17 1436  INR 1.02   Cardiac Enzymes: No results for input(s): CKTOTAL, CKMB, CKMBINDEX, TROPONINI in the last 168 hours. BNP (last 3 results) No results for input(s): PROBNP in the last 8760 hours. HbA1C: No results for input(s): HGBA1C in the last 72 hours. CBG:  Recent Labs Lab 02/03/17 1304  GLUCAP 162*   Lipid Profile: No results for input(s): CHOL, HDL, LDLCALC, TRIG, CHOLHDL, LDLDIRECT in the last 72 hours. Thyroid Function Tests: No results for input(s): TSH, T4TOTAL, FREET4, T3FREE, THYROIDAB in the last 72 hours. Anemia Panel: No results for input(s): VITAMINB12, FOLATE, FERRITIN, TIBC, IRON, RETICCTPCT in the last 72 hours. Urine analysis:    Component Value Date/Time   COLORURINE YELLOW 02/03/2017 1340   APPEARANCEUR CLOUDY (A) 02/03/2017 1340   LABSPEC 1.013 02/03/2017 1340   PHURINE 8.0 02/03/2017 1340   GLUCOSEU NEGATIVE 02/03/2017 1340   HGBUR MODERATE (A) 02/03/2017 1340   BILIRUBINUR NEGATIVE 02/03/2017 1340   KETONESUR NEGATIVE 02/03/2017 1340   PROTEINUR 100 (A) 02/03/2017 1340   UROBILINOGEN 1.0 04/22/2015 1952   NITRITE NEGATIVE 02/03/2017 1340   LEUKOCYTESUR LARGE (A) 02/03/2017 1340   Sepsis Labs: !!!!!!!!!!!!!!!!!!!!!!!!!!!!!!!!!!!!!!!!!!!! @LABRCNTIP (procalcitonin:4,lacticidven:4) )No results found for this or any previous visit (from the past 240 hour(s)).   Radiological Exams on Admission: Dg Chest 2 View  Result Date: 02/03/2017 CLINICAL DATA:  Weakness with mid chest pain and right neck pain several weeks. EXAM: CHEST  2 VIEW COMPARISON:  11/09/2016 scan 10/05/2014 as well as chest CT 10/05/2014 FINDINGS: Sternotomy wires unchanged. Lungs are adequately  inflated with opacification over the right mid to lower lung over the region of the major fissure. Also minimal hazy opacification in the left suprahilar region. Findings are new compared to 11/09/2016, although not significantly changed from 10/05/2014. Cardiomediastinal silhouette and remainder of the exam is unchanged. IMPRESSION: Opacification over the right mid to lower lung centered over the major fissure with mild opacification the left suprahilar region. Right lung findings may represent fluid within the fissure. Cannot exclude atelectasis versus infection over the left suprahilar region. Consider noncontrast chest CT for further evaluation. Electronically Signed   By: Marin Olp M.D.   On: 02/03/2017 14:11   Ct Head Wo Contrast  Result Date: 02/03/2017 CLINICAL DATA:  Generalized weakness. Fell yesterday and hit the right side of his head on the floor. Right neck pain. Previous brain surgery. EXAM: CT HEAD WITHOUT CONTRAST CT CERVICAL SPINE WITHOUT CONTRAST TECHNIQUE: Multidetector CT imaging of the head and cervical spine was performed following the standard protocol without intravenous contrast. Multiplanar CT image reconstructions of the cervical spine were also generated. COMPARISON:  Head and cervical spine CT dated 06/27/2016 and brain MR dated 08/09/2016. FINDINGS: CT HEAD FINDINGS Brain: Diffusely enlarged ventricles and subarachnoid spaces. Patchy white matter low density in both cerebral hemispheres and in the pons without significant change. No intracranial hemorrhage, mass lesion or CT evidence of acute infarction. Vascular: No hyperdense vessel or unexpected calcification. Skull: Stable post craniotomy changes on the right. Stable right burr hole. No fracture. Sinuses/Orbits: No acute finding. Other: None. CT CERVICAL SPINE FINDINGS Alignment: Normal. Skull base and vertebrae: No acute fracture. No primary bone lesion or focal pathologic process. Soft tissues and spinal canal: No  prevertebral fluid or swelling. No visible canal hematoma. Disc levels:  Stable multilevel degenerative changes. Upper chest: Right apical bullous changes.  Right pleural effusion. Other: Bilateral dense carotid artery calcifications. IMPRESSION: 1.  No acute abnormality. 2. Resolved right lateral subdural hematoma. 3. Mildly progressive atrophy and chronic small vessel white matter ischemic changes. 4. Cervical spine degenerative changes. 5. Dense bilateral carotid artery atheromatous calcifications. 6. Right pleural effusion. 7. COPD. Electronically Signed   By: Claudie Revering M.D.   On: 02/03/2017 15:24   Ct Cervical Spine Wo Contrast  Result Date: 02/03/2017 CLINICAL DATA:  Generalized weakness. Fell yesterday and hit the right side of his head on the floor. Right neck pain. Previous brain surgery. EXAM: CT HEAD WITHOUT CONTRAST CT CERVICAL SPINE WITHOUT CONTRAST TECHNIQUE: Multidetector CT imaging of the head and cervical spine was performed following the standard protocol without intravenous contrast. Multiplanar CT image reconstructions of the cervical spine were also generated. COMPARISON:  Head and cervical spine CT dated 06/27/2016 and brain MR dated 08/09/2016. FINDINGS: CT HEAD FINDINGS Brain: Diffusely enlarged ventricles and subarachnoid spaces. Patchy white matter low density in both cerebral hemispheres and in the pons without significant change. No intracranial hemorrhage, mass lesion or CT evidence of acute infarction. Vascular: No hyperdense vessel or unexpected calcification. Skull: Stable post craniotomy changes on the right. Stable right burr hole. No fracture. Sinuses/Orbits: No acute finding. Other: None. CT CERVICAL SPINE FINDINGS Alignment: Normal. Skull base and vertebrae: No acute fracture. No primary bone lesion or focal pathologic process. Soft tissues and spinal canal: No prevertebral fluid or swelling. No visible canal hematoma. Disc levels:  Stable multilevel degenerative changes.  Upper chest: Right apical bullous changes.  Right pleural effusion. Other: Bilateral dense carotid artery calcifications. IMPRESSION: 1. No acute abnormality. 2. Resolved right lateral subdural hematoma. 3. Mildly progressive atrophy and chronic small vessel white matter ischemic changes. 4. Cervical spine degenerative changes. 5. Dense bilateral carotid artery atheromatous calcifications. 6. Right pleural effusion. 7. COPD. Electronically Signed   By: Claudie Revering M.D.   On: 02/03/2017 15:24    EKG: Independently reviewed. NSR  Assessment/Plan Principal Problem:   Acute metabolic encephalopathy Active Problems:   Hypercholesterolemia   Heart transplanted ([place])   DM (diabetes mellitus), type 2 with renal complications (HCC)   Hypothyroidism   ESRD on dialysis ([place])   Uncontrolled hypertension   Urinary tract infection   1. Acute toxic metabolic encephalopathy 1. Consider possible UTI vs uncontrolled HTN as etiologies 2. Appears stable at present 3. Plan per below 4. Will place in obs status. Discussed with Nephrology. OK for renal floor 2. ESRD on TTS HD 1. Missed last scheduled HD session 2. Clinically volume overloaded 3. Nephrology following with plans for HD today 3. DM2 1. Will continue patient on SSI coverage as needed 4. Hx heart transplant 1. Continue immunosuppressives per home regimen 2. Appears stable at this time 5. HTN, poorly controlled 1. SBP into the 270's in the ED 2. Discussed with nephrology. Anticipate bp to improve with removal of volume on HD today 3. Will continue home regimen as tolerated 6. HDL 1. Stable at present 2. Continue home meds 7. Possible UTI 1. UA is suggestive of possible UTI 2. Urine cx ordered, pending 3. Will empirically cover with rocephin  DVT prophylaxis: Heparin subQ  Code Status: Full Family Communication: Pt in room  Disposition Plan: Uncertain at this time  Consults called: Nephrology Admission status: observation, likely  require less than 2 midnight stay to correct volume and bp   Ladesha Pacini, Orpah Melter MD [facility] Pager [phone]  If 7PM-7AM, please contact night-coverage [email or web address] Password [facility]  02/03/2017, 4:44 PM

## 2017-02-04 ENCOUNTER — Observation Stay (HOSPITAL_COMMUNITY): Payer: Medicare HMO

## 2017-02-04 DIAGNOSIS — Z85828 Personal history of other malignant neoplasm of skin: Secondary | ICD-10-CM | POA: Diagnosis not present

## 2017-02-04 DIAGNOSIS — E1169 Type 2 diabetes mellitus with other specified complication: Secondary | ICD-10-CM

## 2017-02-04 DIAGNOSIS — E785 Hyperlipidemia, unspecified: Secondary | ICD-10-CM | POA: Diagnosis present

## 2017-02-04 DIAGNOSIS — D631 Anemia in chronic kidney disease: Secondary | ICD-10-CM | POA: Diagnosis present

## 2017-02-04 DIAGNOSIS — R531 Weakness: Secondary | ICD-10-CM | POA: Diagnosis present

## 2017-02-04 DIAGNOSIS — N390 Urinary tract infection, site not specified: Secondary | ICD-10-CM | POA: Diagnosis present

## 2017-02-04 DIAGNOSIS — E7800 Pure hypercholesterolemia, unspecified: Secondary | ICD-10-CM | POA: Diagnosis present

## 2017-02-04 DIAGNOSIS — F0390 Unspecified dementia without behavioral disturbance: Secondary | ICD-10-CM | POA: Diagnosis present

## 2017-02-04 DIAGNOSIS — Z7952 Long term (current) use of systemic steroids: Secondary | ICD-10-CM | POA: Diagnosis not present

## 2017-02-04 DIAGNOSIS — Z9115 Patient's noncompliance with renal dialysis: Secondary | ICD-10-CM | POA: Diagnosis not present

## 2017-02-04 DIAGNOSIS — Z79899 Other long term (current) drug therapy: Secondary | ICD-10-CM | POA: Diagnosis not present

## 2017-02-04 DIAGNOSIS — I2510 Atherosclerotic heart disease of native coronary artery without angina pectoris: Secondary | ICD-10-CM | POA: Diagnosis present

## 2017-02-04 DIAGNOSIS — I120 Hypertensive chronic kidney disease with stage 5 chronic kidney disease or end stage renal disease: Secondary | ICD-10-CM | POA: Diagnosis present

## 2017-02-04 DIAGNOSIS — Z7982 Long term (current) use of aspirin: Secondary | ICD-10-CM | POA: Diagnosis not present

## 2017-02-04 DIAGNOSIS — G9341 Metabolic encephalopathy: Secondary | ICD-10-CM | POA: Diagnosis not present

## 2017-02-04 DIAGNOSIS — Z941 Heart transplant status: Secondary | ICD-10-CM | POA: Diagnosis not present

## 2017-02-04 DIAGNOSIS — E1122 Type 2 diabetes mellitus with diabetic chronic kidney disease: Secondary | ICD-10-CM | POA: Diagnosis present

## 2017-02-04 DIAGNOSIS — E039 Hypothyroidism, unspecified: Secondary | ICD-10-CM | POA: Diagnosis present

## 2017-02-04 DIAGNOSIS — E8770 Fluid overload, unspecified: Secondary | ICD-10-CM | POA: Diagnosis present

## 2017-02-04 DIAGNOSIS — Z992 Dependence on renal dialysis: Secondary | ICD-10-CM | POA: Diagnosis not present

## 2017-02-04 DIAGNOSIS — M898X9 Other specified disorders of bone, unspecified site: Secondary | ICD-10-CM | POA: Diagnosis present

## 2017-02-04 DIAGNOSIS — E1121 Type 2 diabetes mellitus with diabetic nephropathy: Secondary | ICD-10-CM | POA: Diagnosis present

## 2017-02-04 DIAGNOSIS — Z8673 Personal history of transient ischemic attack (TIA), and cerebral infarction without residual deficits: Secondary | ICD-10-CM | POA: Diagnosis not present

## 2017-02-04 DIAGNOSIS — G92 Toxic encephalopathy: Secondary | ICD-10-CM | POA: Diagnosis present

## 2017-02-04 DIAGNOSIS — N186 End stage renal disease: Secondary | ICD-10-CM | POA: Diagnosis not present

## 2017-02-04 DIAGNOSIS — I252 Old myocardial infarction: Secondary | ICD-10-CM | POA: Diagnosis not present

## 2017-02-04 DIAGNOSIS — F329 Major depressive disorder, single episode, unspecified: Secondary | ICD-10-CM | POA: Diagnosis present

## 2017-02-04 LAB — CBC
HEMATOCRIT: 33.9 % — AB (ref 39.0–52.0)
HEMOGLOBIN: 10.8 g/dL — AB (ref 13.0–17.0)
MCH: 31.6 pg (ref 26.0–34.0)
MCHC: 31.9 g/dL (ref 30.0–36.0)
MCV: 99.1 fL (ref 78.0–100.0)
Platelets: 213 10*3/uL (ref 150–400)
RBC: 3.42 MIL/uL — ABNORMAL LOW (ref 4.22–5.81)
RDW: 15 % (ref 11.5–15.5)
WBC: 10.3 10*3/uL (ref 4.0–10.5)

## 2017-02-04 LAB — COMPREHENSIVE METABOLIC PANEL
ALBUMIN: 3.1 g/dL — AB (ref 3.5–5.0)
ALK PHOS: 122 U/L (ref 38–126)
ALT: 15 U/L — ABNORMAL LOW (ref 17–63)
ANION GAP: 10 (ref 5–15)
AST: 18 U/L (ref 15–41)
BILIRUBIN TOTAL: 0.6 mg/dL (ref 0.3–1.2)
BUN: 19 mg/dL (ref 6–20)
CO2: 30 mmol/L (ref 22–32)
Calcium: 8.7 mg/dL — ABNORMAL LOW (ref 8.9–10.3)
Chloride: 98 mmol/L — ABNORMAL LOW (ref 101–111)
Creatinine, Ser: 3.81 mg/dL — ABNORMAL HIGH (ref 0.61–1.24)
GFR, EST AFRICAN AMERICAN: 16 mL/min — AB (ref >60)
GFR, EST NON AFRICAN AMERICAN: 14 mL/min — AB (ref >60)
Glucose, Bld: 163 mg/dL — ABNORMAL HIGH (ref 65–99)
POTASSIUM: 3.8 mmol/L (ref 3.5–5.1)
Sodium: 138 mmol/L (ref 135–145)
TOTAL PROTEIN: 5.8 g/dL — AB (ref 6.5–8.1)

## 2017-02-04 LAB — GLUCOSE, CAPILLARY
GLUCOSE-CAPILLARY: 114 mg/dL — AB (ref 65–99)
GLUCOSE-CAPILLARY: 139 mg/dL — AB (ref 65–99)
GLUCOSE-CAPILLARY: 129 mg/dL — AB (ref 65–99)
Glucose-Capillary: 114 mg/dL — ABNORMAL HIGH (ref 65–99)
Glucose-Capillary: 186 mg/dL — ABNORMAL HIGH (ref 65–99)

## 2017-02-04 MED ORDER — HEPARIN SODIUM (PORCINE) 1000 UNIT/ML DIALYSIS: 1000.0000 [IU] | INTRAMUSCULAR | Status: DC | PRN

## 2017-02-04 MED ORDER — SODIUM CHLORIDE 0.9 % IV SOLN: 100.0000 mL | INTRAVENOUS | Status: DC | PRN

## 2017-02-04 MED ORDER — ALTEPLASE 2 MG IJ SOLR: 2.0000 mg | Freq: Once | INTRAMUSCULAR | Status: DC | PRN

## 2017-02-04 MED ORDER — SPIRONOLACTONE 25 MG PO TABS
25.0000 mg | ORAL_TABLET | Freq: Every day | ORAL | Status: DC
  Administered 2017-02-04 – 2017-02-06 (×3): 25 mg via ORAL
  Filled 2017-02-04 (×3): qty 1

## 2017-02-04 MED ORDER — LIDOCAINE-PRILOCAINE 2.5-2.5 % EX CREA: 1.0000 "application " | TOPICAL_CREAM | CUTANEOUS | Status: DC | PRN

## 2017-02-04 MED ORDER — PENTAFLUOROPROP-TETRAFLUOROETH EX AERO: 1.0000 "application " | INHALATION_SPRAY | CUTANEOUS | Status: DC | PRN

## 2017-02-04 MED ORDER — LIDOCAINE HCL (PF) 1 % IJ SOLN: 5.0000 mL | INTRAMUSCULAR | Status: DC | PRN

## 2017-02-04 NOTE — Evaluation (Signed)
Physical Therapy Evaluation Patient Details Name: [name] [name] MRN: [id number] DOB: Aug 12, 1941 Today's Date: 02/04/2017   History of Present Illness  76 y.o. male with medical history significant of ESRD on TTS HD, DM2, hx heart transplant, poorly controlled HTN, HLD who presented to the ED with comlaints of 2 weeks of generalized weakness and worsening confusion. Patient reportedly missed scheduled HD session on 3/15.  Clinical Impression  Patient presents with problems listed below.  Will benefit from acute PT to maximize functional mobility prior to discharge.  Patient able to ambulate short distances with RW pta.  Today, patient required +2 max assist to sit EOB.  Recommend SNF at d/c for continued therapy with goal to return home with wife.    Follow Up Recommendations SNF;Supervision/Assistance - 24 hour    Equipment Recommendations  None recommended by PT    Recommendations for Other Services       Precautions / Restrictions Precautions Precautions: Fall Restrictions Weight Bearing Restrictions: No      Mobility  Bed Mobility Overal bed mobility: Needs Assistance Bed Mobility: Supine to Sit;Sit to Supine     Supine to sit: Max assist;+2 for physical assistance Sit to supine: Max assist;+2 for physical assistance   General bed mobility comments: Patient able to move LE's to EOB with repeated cues to stay on task.  Patient unable to initiate lifting trunk from bed.  Required max assist of 2 to bring trunk to upright position.  Required max assist to maintain sitting balance due to posterior lean.  Patient sat approx 20 seconds and pushed himself backward.  "I need to lay down"  Patient reports feeling dizzy.  Returned trunk to bed with max assist.  Patient able to assist with bringing LE's onto bed.  Patient reports dizziness stopped once supine.  Left patient with HOB in more upright position.  Transfers                 General transfer comment:  NT  Ambulation/Gait                Stairs            Wheelchair Mobility    Modified Rankin (Stroke Patients Only)       Balance   Sitting-balance support: Bilateral upper extremity supported Sitting balance-Leahy Scale: Zero                                       Pertinent Vitals/Pain Pain Assessment: No/denies pain    Home Living Family/patient expects to be discharged to:: Private residence Living Arrangements: Spouse/significant other Available Help at Discharge: Family;Available 24 hours/day Type of Home: Apartment Home Access: Stairs to enter Entrance Stairs-Rails: Right Entrance Stairs-Number of Steps: 3 Home Layout: One level Home Equipment: Walker - 4 wheels;Transport chair;Wheelchair - power      Prior Function Level of Independence: Needs assistance   Gait / Transfers Assistance Needed: Short distances to bathroom with RW.  Otherwise uses w/c.  ADL's / Homemaking Assistance Needed: Assist for bathing/dressing. Pt only able to provide limited PLOF data.         Hand Dominance   Dominant Hand: Right    Extremity/Trunk Assessment   Upper Extremity Assessment Upper Extremity Assessment: Defer to OT evaluation    Lower Extremity Assessment Lower Extremity Assessment: Generalized weakness    Cervical / Trunk Assessment Cervical / Trunk Assessment: Kyphotic  Communication  Communication: HOH  Cognition Arousal/Alertness: Awake/alert Behavior During Therapy: WFL for tasks assessed/performed;Flat affect Overall Cognitive Status: No family/caregiver present to determine baseline cognitive functioning                 General Comments: Patient with difficulty providing PLOF and home information.  Required multiple cues for tasks - loses attention.  Slow to process instructions.    General Comments      Exercises     Assessment/Plan    PT Assessment Patient needs continued PT services  PT Problem List  Decreased strength;Decreased activity tolerance;Decreased balance;Decreased mobility;Decreased cognition;Decreased knowledge of use of DME       PT Treatment Interventions DME instruction;Gait training;Functional mobility training;Therapeutic activities;Therapeutic exercise;Balance training;Cognitive remediation;Patient/family education    PT Goals (Current goals can be found in the Care Plan section)  Acute Rehab PT Goals Patient Stated Goal: did not state PT Goal Formulation: With patient Time For Goal Achievement: 02/18/17 Potential to Achieve Goals: Fair    Frequency Min 2X/week   Barriers to discharge Decreased caregiver support Do not feel wife can provide level of care needed by patient.    Co-evaluation               End of Session   Activity Tolerance: Patient limited by fatigue;Treatment limited secondary to medical complications (Comment) (Limited by dizziness) Patient left: in bed;with call bell/phone within reach;with bed alarm set   PT Visit Diagnosis: Difficulty in walking, not elsewhere classified (R26.2);Muscle weakness (generalized) (M62.81)    Functional Assessment Tool Used: AM-PAC 6 Clicks Basic Mobility;Clinical judgement Functional Limitation: Mobility: Walking and moving around Mobility: Walking and Moving Around Current Status (L2440): At least 60 percent but less than 80 percent impaired, limited or restricted Mobility: Walking and Moving Around Goal Status [phone]): At least 1 percent but less than 20 percent impaired, limited or restricted    Time: 1127-1137 PT Time Calculation (min) (ACUTE ONLY): 10 min   Charges:   PT Evaluation $PT Eval Moderate Complexity: 1 Procedure     PT G Codes:   PT G-Codes **NOT FOR INPATIENT CLASS** Functional Assessment Tool Used: AM-PAC 6 Clicks Basic Mobility;Clinical judgement Functional Limitation: Mobility: Walking and moving around Mobility: Walking and Moving Around Current Status (Z3664): At least 60  percent but less than 80 percent impaired, limited or restricted Mobility: Walking and Moving Around Goal Status [phone]): At least 1 percent but less than 20 percent impaired, limited or restricted     Despina Pole 02/04/2017, 1:09 PM Carita Pian. Sanjuana Kava, [place] Pager [phone]

## 2017-02-04 NOTE — Progress Notes (Signed)
Patient ID: [name] [name], male   DOB: 10/03/1941, 76 y.o.   MRN: [id number]  [facility] Progress Note   Assessment/ Plan:   1. Weakness/fall: Imaging of head/neck did not show any osseous injuries and shows improving subdural hematoma. 2. ESRD: underwent hemodialysis yesterday for features of volume overload after a few days of missed hemodialysis. Currently appears to be better from a volume standpoint and without any acute electrolyte abnormalities prompt additional dialysis. Next hemodialysis due Tuesday. 3. Anemia: Denies overt blood loss, hemoglobin levels within acceptable range. Continue to monitor 4. CKD-MBD: Continue calcium acetate 3 times a day with meals for phosphorus binding. 5. Nutrition: Acceptable albumin levels, continue renal vitamin and encouraging increased lean protein intake. 6. Hypertension: Blood pressures remain elevated after challenging dry weight/ultrafiltration and hemodialysis yesterday. Intolerances to multiple antihypertensive agents noted in the past occluding amlodipine, ACE inhibitors and ARB's. He is currently on metoprolol 50 mg twice a day with heart rate in the 70s. I will start him on spironolactone 25mg  daily.  Subjective:   Reports some headache this morning but denies any focal deficit. Underwent hemodialysis yesterday without problem.    Objective:   BP (!) 189/74 (BP Location: Right Arm)   Pulse 70   Temp 97.9 F (36.6 C) (Oral)   Resp 18   Ht 6\' 2"  (1.88 m)   Wt 68.2 kg (150 lb 5.7 oz)   SpO2 100%   BMI 19.30 kg/m   Physical Exam: HGD:[id number] resting in bed, being prepared by physical therapy for transfer to recliner. CVS: Pulse regular rhythm, S1 and S2 normal Resp: Poor inspiratory effort with decreased breath sounds over bases. Fine rales left side Abd: Soft, flat, nontender Ext: No lower extremity edema  Labs: BMET  Recent Labs Lab 02/03/17 1532 02/04/17 0725  NA 137 138  K 5.1 3.8  CL 93* 98*  CO2  30 30  GLUCOSE 146* 163*  BUN 57* 19  CREATININE 8.19* 3.81*  CALCIUM 8.8* 8.7*   CBC  Recent Labs Lab 02/03/17 1436 02/04/17 0725  WBC 11.2* 10.3  NEUTROABS 8.1*  --   HGB 11.4* 10.8*  HCT 35.5* 33.9*  MCV 98.3 99.1  PLT 300 213   Medications:    . aspirin EC  81 mg Oral QHS  . calcium acetate  667 mg Oral TID WC  . cefTRIAXone (ROCEPHIN)  IV  1 g Intravenous Q24H  . cholecalciferol  1,000 Units Oral Daily  . cycloSPORINE  50 mg Oral QHS  . cycloSPORINE  75 mg Oral q morning - 10a  . donepezil  10 mg Oral QHS  . FLUoxetine  20 mg Oral Daily  . heparin  5,000 Units Subcutaneous Q8H  . insulin aspart  0-5 Units Subcutaneous QHS  . insulin aspart  0-9 Units Subcutaneous TID WC  . levothyroxine  75 mcg Oral QAC breakfast  . metoprolol tartrate  50 mg Oral BID  . multivitamin  1 tablet Oral Daily  . mycophenolate  250 mg Oral Once per day on Tue Thu Sat  . mycophenolate  500 mg Oral Daily  . pantoprazole  40 mg Oral Daily  . pravastatin  10 mg Oral QHS  . sodium chloride flush  3 mL Intravenous Q12H   Elmarie Shiley, MD 02/04/2017, 9:17 AM

## 2017-02-04 NOTE — Progress Notes (Signed)
Patient ID: [name] [name], male   DOB: 1941/03/29, 76 y.o.   MRN: [id number]  PROGRESS NOTE    [name] [name]  EZM:[id number] DOB: 13-Jul-1941 DOA: 02/03/2017  PCP: Junie Panning, NP   Brief Narrative:  76 y.o. male with medical history significant for ESRD on TTS HD, DM2, hx heart transplant, poorly controlled HTN, HLD who presented to the ED with progressive generalized weakness and worsening confusion. Patient reportedly missed scheduled HD session on 3/15. In the ED, patient was noted to be volume overloaded. CXR was suggestive of volume overload. Cr noted to be 8.19. K was  5.1. BP was 270/110. Nephrology was consulted.   Assessment & Plan:  Acute toxic metabolic encephalopathy - Likely due to UTI as well as missed HD - Still confused this am - Continue to monitor mental status   Gram negative rod UTI - Continue rocephin - Follow up final urine cx results  ESRD on TTS HD - Appreciate nephrology following  Anemia of chronic disease - Due to ESRD - Hgb stable   Diabetes mellitus with diabetic nephropathy - Continue SSI - CBG's in past 24 hours: 114, 139, 114   Hx heart transplant - Continue immunosuppressives per home regimen: cyclosporine and cellcept  Accelerated hypertension - Continue metoprolol and aldactone   Dyslipidemia associated with type 2 DM - Continue Pravachol   Depression - Continue Prozac - Continue Aricept   Hypothyroidism - Continue synthroid    DVT prophylaxis: Hep subQ Code Status: full code  Family Communication: no family at the bedside this am Disposition Plan: to SNF likely by 3/20 once final urine cx are back    Consultants:   PT  Procedures:   None   Antimicrobials:   Rocephin 02/03/2017 -->   Subjective: No overnight events.  Objective: Vitals:   02/04/17 0400 02/04/17 0430 02/04/17 0455 02/04/17 0612  BP: (!) 205/95 (!) 183/84 (!) 179/88 (!) 189/74  Pulse: 67 64 62 70  Resp: 18 18 18 18   Temp:    97.8 F (36.6 C) 97.9 F (36.6 C)  TempSrc:   Oral Oral  SpO2:   99% 100%  Weight:      Height:        Intake/Output Summary (Last 24 hours) at 02/04/17 1453 Last data filed at 02/04/17 5035  Gross per 24 hour  Intake              170 ml  Output             2500 ml  Net            -2330 ml   Filed Weights   02/03/17 1825 02/03/17 2203 02/04/17 0045  Weight: 69.6 kg (153 lb 6.4 oz) 69.9 kg (154 lb 1.6 oz) 68.2 kg (150 lb 5.7 oz)    Examination:  General exam: Appears calm and comfortable, confused   Respiratory system: Clear to auscultation. Respiratory effort normal. Cardiovascular system: S1 & S2 heard, Rate controlled  Gastrointestinal system: Abdomen is nondistended, soft and nontender. No organomegaly or masses felt. Normal bowel sounds heard. Central nervous system: No focal neurological deficits. Extremities: Symmetric 5 x 5 power. Skin: No rashes, lesions or ulcers Psychiatry: Judgement and insight appear normal. Mood & affect appropriate.   Data Reviewed: I have personally reviewed following labs and imaging studies  CBC:  Recent Labs Lab 02/03/17 1436 02/04/17 0725  WBC 11.2* 10.3  NEUTROABS 8.1*  --   HGB 11.4* 10.8*  HCT 35.5* 33.9*  MCV  98.3 99.1  PLT 300 856   Basic Metabolic Panel:  Recent Labs Lab 02/03/17 1532 02/04/17 0725  NA 137 138  K 5.1 3.8  CL 93* 98*  CO2 30 30  GLUCOSE 146* 163*  BUN 57* 19  CREATININE 8.19* 3.81*  CALCIUM 8.8* 8.7*   GFR: Estimated Creatinine Clearance: 15.9 mL/min (A) (by C-G formula based on SCr of 3.81 mg/dL (H)). Liver Function Tests:  Recent Labs Lab 02/03/17 1532 02/04/17 0725  AST 17 18  ALT 16* 15*  ALKPHOS 122 122  BILITOT 0.8 0.6  PROT 5.4* 5.8*  ALBUMIN 3.2* 3.1*   No results for input(s): LIPASE, AMYLASE in the last 168 hours. No results for input(s): AMMONIA in the last 168 hours. Coagulation Profile:  Recent Labs Lab 02/03/17 1436  INR 1.02   Cardiac Enzymes: No results for  input(s): CKTOTAL, CKMB, CKMBINDEX, TROPONINI in the last 168 hours. BNP (last 3 results) No results for input(s): PROBNP in the last 8760 hours. HbA1C: No results for input(s): HGBA1C in the last 72 hours. CBG:  Recent Labs Lab 02/03/17 1304 02/03/17 2205 02/04/17 0411 02/04/17 0753 02/04/17 1246  GLUCAP 162* 149* 114* 139* 114*   Lipid Profile: No results for input(s): CHOL, HDL, LDLCALC, TRIG, CHOLHDL, LDLDIRECT in the last 72 hours. Thyroid Function Tests: No results for input(s): TSH, T4TOTAL, FREET4, T3FREE, THYROIDAB in the last 72 hours. Anemia Panel: No results for input(s): VITAMINB12, FOLATE, FERRITIN, TIBC, IRON, RETICCTPCT in the last 72 hours. Urine analysis:    Component Value Date/Time   COLORURINE YELLOW 02/03/2017 1340   APPEARANCEUR CLOUDY (A) 02/03/2017 1340   LABSPEC 1.013 02/03/2017 1340   PHURINE 8.0 02/03/2017 1340   GLUCOSEU NEGATIVE 02/03/2017 1340   HGBUR MODERATE (A) 02/03/2017 1340   BILIRUBINUR NEGATIVE 02/03/2017 1340   KETONESUR NEGATIVE 02/03/2017 1340   PROTEINUR 100 (A) 02/03/2017 1340   UROBILINOGEN 1.0 04/22/2015 1952   NITRITE NEGATIVE 02/03/2017 1340   LEUKOCYTESUR LARGE (A) 02/03/2017 1340   Sepsis Labs: @LABRCNTIP (procalcitonin:4,lacticidven:4)   Urine culture     Status: Abnormal (Preliminary result)   Collection Time: 02/03/17  1:40 PM  Result Value Ref Range Status   Specimen Description URINE, CLEAN CATCH  Final   Special Requests NONE  Final   Culture >=100,000 COLONIES/mL GRAM NEGATIVE RODS (A)  Final   Report Status PENDING  Incomplete  MRSA PCR Screening     Status: None   Collection Time: 02/03/17  6:42 PM  Result Value Ref Range Status   MRSA by PCR NEGATIVE NEGATIVE Final      Radiology Studies: Dg Chest 2 View Result Date: 02/03/2017 Opacification over the right mid to lower lung centered over the major fissure with mild opacification the left suprahilar region. Right lung findings may represent fluid  within the fissure. Cannot exclude atelectasis versus infection over the left suprahilar region. Consider noncontrast chest CT for further evaluation.   Ct Head Wo Contrast Result Date: 02/03/2017 1. No acute abnormality. 2. Resolved right lateral subdural hematoma. 3. Mildly progressive atrophy and chronic small vessel white matter ischemic changes. 4. Cervical spine degenerative changes. 5. Dense bilateral carotid artery atheromatous calcifications. 6. Right pleural effusion. 7. COPD. Electronically Signed   By: Claudie Revering M.D.   On: 02/03/2017 15:24   Ct Cervical Spine Wo Contrast Result Date: 02/03/2017 1. No acute abnormality. 2. Resolved right lateral subdural hematoma. 3. Mildly progressive atrophy and chronic small vessel white matter ischemic changes. 4. Cervical spine degenerative changes. 5.  Dense bilateral carotid artery atheromatous calcifications. 6. Right pleural effusion. 7. COPD. Electronically Signed   By: Claudie Revering M.D.   On: 02/03/2017 15:24   Dg Chest Port 1 View Result Date: 02/04/2017 1. Improved left suprahilar atelectasis or pneumonia. 2. Stable small right pleural effusion with partial loculation. 3. Mild changes of COPD.    Scheduled Meds: . aspirin EC  81 mg Oral QHS  . calcium acetate  667 mg Oral TID WC  . cefTRIAXone (ROCEPHIN)  IV  1 g Intravenous Q24H  . cholecalciferol  1,000 Units Oral Daily  . cycloSPORINE  50 mg Oral QHS  . cycloSPORINE  75 mg Oral q morning - 10a  . donepezil  10 mg Oral QHS  . FLUoxetine  20 mg Oral Daily  . heparin  5,000 Units Subcutaneous Q8H  . insulin aspart  0-5 Units Subcutaneous QHS  . insulin aspart  0-9 Units Subcutaneous TID WC  . levothyroxine  75 mcg Oral QAC breakfast  . metoprolol tartrate  50 mg Oral BID  . multivitamin  1 tablet Oral Daily  . mycophenolate  250 mg Oral Once per day on Tue Thu Sat  . mycophenolate  500 mg Oral Daily  . pantoprazole  40 mg Oral Daily  . pravastatin  10 mg Oral QHS  . sodium  chloride flush  3 mL Intravenous Q12H  . spironolactone  25 mg Oral Daily   Continuous Infusions:   LOS: 0 days    Time spent: 25 minutes  Greater than 50% of the time spent on counseling and coordinating the care.   Leisa Lenz, MD [facility] Pager [phone]  If 7PM-7AM, please contact night-coverage [email or web address] Password [facility] 02/04/2017, 2:53 PM

## 2017-02-04 NOTE — Evaluation (Signed)
Occupational Therapy Evaluation Patient Details Name: [name] [name] MRN: [id number] DOB: 1941/03/04 Today's Date: 02/04/2017    History of Present Illness 76 y.o. male with medical history significant of ESRD on TTS HD, DM2, hx heart transplant, poorly controlled HTN, HLD who presented to the ED with comlaints of 2 weeks of generalized weakness and worsening confusion. Patient reportedly missed scheduled HD session on 3/15.   Clinical Impression   Pt admitted with the above diagnoses and presents with below problem list. Pt will benefit from continued acute OT to address the below listed deficits and maximize independence with basic ADLs prior to d/c to venue below. PTA pt reports he was needing assistance of wife for sponge bathing and dressing. His ability to provide PLOF data was limited, "I don't really know," but he did report that he ambulates with a walker only to go to the bathroom (likely with +1 assist) and uses a wheelchair for distances further than that. No family present during eval. Pt presents with generalized weakness/fatigue. Pt is +2 for bed mobility. Unable to transfer fully to EOB with only +1 max A available this session.     Follow Up Recommendations  SNF    Equipment Recommendations  None recommended by OT    Recommendations for Other Services PT consult     Precautions / Restrictions Precautions Precautions: Fall Restrictions Weight Bearing Restrictions: No      Mobility Bed Mobility Overal bed mobility: Needs Assistance             General bed mobility comments: Multiple attempts with max A provided and rest breaks inorporated. Pt initiating supine>EOB but with difficulty during power up phase and then asking as he moved back into supine "Now what were we doing again?" Able briefly to come up most of the way with max A to trunk but quickly fatiguing and initiating return to supine.   Transfers                      Balance Overall  balance assessment: Needs assistance Sitting-balance support: Bilateral upper extremity supported Sitting balance-Leahy Scale: Zero Sitting balance - Comments: almost but not quite fully EOB for a brief moment.                                    ADL Overall ADL's : Needs assistance/impaired Eating/Feeding: Set up;Bed level   Grooming: Bed level;Moderate assistance   Upper Body Bathing: Maximal assistance;Bed level   Lower Body Bathing: Maximal assistance;Bed level   Upper Body Dressing : Maximal assistance;Bed level   Lower Body Dressing: Maximal assistance;Bed level                 General ADL Comments: Zero sitting balance. Multiple attempts to come EOB. Cognition and weakness were limiting factors.      Vision         Perception     Praxis      Pertinent Vitals/Pain Pain Assessment: Faces Faces Pain Scale: Hurts little more Pain Location: neck Pain Descriptors / Indicators: Sore Pain Intervention(s): Limited activity within patient's tolerance;Monitored during session;Repositioned     Hand Dominance Right   Extremity/Trunk Assessment Upper Extremity Assessment Upper Extremity Assessment: Generalized weakness   Lower Extremity Assessment Lower Extremity Assessment: Defer to PT evaluation;Generalized weakness       Communication Communication Communication: HOH   Cognition Arousal/Alertness: Awake/alert Behavior During Therapy: Flat affect;WFL  for tasks assessed/performed Overall Cognitive Status: No family/caregiver present to determine baseline cognitive functioning                 General Comments: During bed mobility pt asking multiple times, "What is it we're doing?"    General Comments       Exercises       Shoulder Instructions      Home Living Family/patient expects to be discharged to:: Private residence Living Arrangements: Spouse/significant other Available Help at Discharge: Family;Available 24  hours/day Type of Home: Apartment Home Access: Stairs to enter Entrance Stairs-Number of Steps: 3 Entrance Stairs-Rails: Right Home Layout: One level     Bathroom Shower/Tub: Tub/shower unit Shower/tub characteristics: Curtain Biochemist, clinical: Standard     Home Equipment: Environmental consultant - 4 wheels;Transport chair (w/c, manual vs. power?)   Additional Comments: Pt reports he sponge baths at home. Wife assists with ADLs. Home setup data from chart review, discussion with nurse. Pt able to provide limited PLOF information. Pt reports he has adjustable bed at home, would need to confirm this if he d/c home.       Prior Functioning/Environment Level of Independence: Needs assistance  Gait / Transfers Assistance Needed: rw and w/c. Pt reports he only ambulates to/from the bathroom. Likely with someone with him. ADL's / Homemaking Assistance Needed: Assist for bathing/dressing. Pt only able to provide limited PLOF data.    Comments: "I don't walk to the kitchen, just the bathroom."        OT Problem List: Decreased strength;Decreased activity tolerance;Impaired balance (sitting and/or standing);Decreased cognition;Decreased safety awareness;Decreased knowledge of use of DME or AE;Decreased knowledge of precautions      OT Treatment/Interventions: Self-care/ADL training;Therapeutic exercise;Energy conservation;DME and/or AE instruction;Therapeutic activities;Patient/family education;Balance training    OT Goals(Current goals can be found in the care plan section) Acute Rehab OT Goals Patient Stated Goal: did not state OT Goal Formulation: With patient Time For Goal Achievement: 02/18/17 Potential to Achieve Goals: Fair ADL Goals Pt Will Perform Grooming: with min assist;sitting (supported sitting) Pt Will Perform Upper Body Bathing: with mod assist;sitting Pt Will Perform Lower Body Bathing: with min assist;sit to/from stand (+2) Pt Will Transfer to Toilet: stand pivot transfer;bedside  commode;with max assist Pt Will Perform Toileting - Clothing Manipulation and hygiene: with max assist;bed level Additional ADL Goal #1: Pt will complete bed mobility at min A level with HOB elevated as needed to prepare for OOB ADLs.   OT Frequency: Min 2X/week   Barriers to D/C:    per conversation with nurse and pt, wife assist at home       Co-evaluation              End of Session Nurse Communication: Mobility status  Activity Tolerance: Patient limited by fatigue Patient left: in bed;with call bell/phone within reach;with bed alarm set  OT Visit Diagnosis: Muscle weakness (generalized) (M62.81);Other symptoms and signs involving cognitive function                ADL either performed or assessed with clinical judgement  Time: 0900-0934 OT Time Calculation (min): 34 min Charges:  OT General Charges $OT Visit: 1 Procedure OT Evaluation $OT Eval Moderate Complexity: 1 Procedure OT Treatments $Self Care/Home Management : 8-22 mins G-Codes: OT G-codes **NOT FOR INPATIENT CLASS** Functional Assessment Tool Used: AM-PAC 6 Clicks Daily Activity Functional Limitation: Self care Self Care Current Status (D9741): At least 60 percent but less than 80 percent impaired, limited or restricted Self Care  Goal Status (P1003): At least 40 percent but less than 60 percent impaired, limited or restricted     Hortencia Pilar 02/04/2017, 10:55 AM

## 2017-02-05 LAB — BASIC METABOLIC PANEL
ANION GAP: 14 (ref 5–15)
BUN: 29 mg/dL — ABNORMAL HIGH (ref 6–20)
CALCIUM: 8.8 mg/dL — AB (ref 8.9–10.3)
CO2: 29 mmol/L (ref 22–32)
Chloride: 94 mmol/L — ABNORMAL LOW (ref 101–111)
Creatinine, Ser: 5.58 mg/dL — ABNORMAL HIGH (ref 0.61–1.24)
GFR calc Af Amer: 10 mL/min — ABNORMAL LOW (ref >60)
GFR, EST NON AFRICAN AMERICAN: 9 mL/min — AB (ref >60)
Glucose, Bld: 91 mg/dL (ref 65–99)
Potassium: 4 mmol/L (ref 3.5–5.1)
Sodium: 137 mmol/L (ref 135–145)

## 2017-02-05 LAB — URINE CULTURE: Culture: >=100000 — AB

## 2017-02-05 LAB — GLUCOSE, CAPILLARY
GLUCOSE-CAPILLARY: 151 mg/dL — AB (ref 65–99)
GLUCOSE-CAPILLARY: 157 mg/dL — AB (ref 65–99)
Glucose-Capillary: 94 mg/dL (ref 65–99)
Glucose-Capillary: 160 mg/dL — ABNORMAL HIGH (ref 65–99)

## 2017-02-05 LAB — CBC
HCT: 32.7 % — ABNORMAL LOW (ref 39.0–52.0)
Hemoglobin: 10.1 g/dL — ABNORMAL LOW (ref 13.0–17.0)
MCH: 30.8 pg (ref 26.0–34.0)
MCHC: 30.9 g/dL (ref 30.0–36.0)
MCV: 99.7 fL (ref 78.0–100.0)
PLATELETS: 240 10*3/uL (ref 150–400)
RBC: 3.28 MIL/uL — ABNORMAL LOW (ref 4.22–5.81)
RDW: 15 % (ref 11.5–15.5)
WBC: 7.3 10*3/uL (ref 4.0–10.5)

## 2017-02-05 MED ORDER — AMLODIPINE BESYLATE 5 MG PO TABS
5.0000 mg | ORAL_TABLET | Freq: Two times a day (BID) | ORAL | Status: DC
  Administered 2017-02-05 – 2017-02-06 (×2): 5 mg via ORAL
  Filled 2017-02-05 (×2): qty 1

## 2017-02-05 MED ORDER — LABETALOL HCL 300 MG PO TABS
300.0000 mg | ORAL_TABLET | Freq: Two times a day (BID) | ORAL | Status: DC
  Administered 2017-02-05 (×2): 300 mg via ORAL
  Filled 2017-02-05 (×2): qty 1

## 2017-02-05 MED ORDER — HYDRALAZINE HCL 20 MG/ML IJ SOLN
10.0000 mg | Freq: Once | INTRAMUSCULAR | Status: AC
  Administered 2017-02-05: 10 mg via INTRAVENOUS
  Filled 2017-02-05: qty 1

## 2017-02-05 MED ORDER — CIPROFLOXACIN HCL 500 MG PO TABS
500.0000 mg | ORAL_TABLET | Freq: Every day | ORAL | Status: DC
  Administered 2017-02-05 – 2017-02-06 (×2): 500 mg via ORAL
  Filled 2017-02-05 (×2): qty 1

## 2017-02-05 NOTE — Progress Notes (Signed)
  [facility] Progress Note   Subjective: no c/o's.   Vitals:   02/04/17 2027 02/05/17 0522 02/05/17 0846 02/05/17 0900  BP: (!) 202/74 (S) (!) 209/69 (!) 235/74 (!) 176/65  Pulse: 71 (!) 58 63 68  Resp: 16 17 16    Temp: 98.7 F (37.1 C) 98.4 F (36.9 C) 98.2 F (36.8 C)   TempSrc: Oral Oral Oral   SpO2: 100% 99% 100%   Weight: 68.8 kg (151 lb 10.8 oz)     Height:        Inpatient medications: . amLODipine  5 mg Oral BID  . aspirin EC  81 mg Oral QHS  . calcium acetate  667 mg Oral TID WC  . cefTRIAXone (ROCEPHIN)  IV  1 g Intravenous Q24H  . cholecalciferol  1,000 Units Oral Daily  . cycloSPORINE  50 mg Oral QHS  . cycloSPORINE  75 mg Oral q morning - 10a  . donepezil  10 mg Oral QHS  . FLUoxetine  20 mg Oral Daily  . heparin  5,000 Units Subcutaneous Q8H  . insulin aspart  0-5 Units Subcutaneous QHS  . insulin aspart  0-9 Units Subcutaneous TID WC  . labetalol  300 mg Oral BID  . levothyroxine  75 mcg Oral QAC breakfast  . multivitamin  1 tablet Oral Daily  . mycophenolate  250 mg Oral Once per day on Tue Thu Sat  . mycophenolate  500 mg Oral Daily  . pantoprazole  40 mg Oral Daily  . pravastatin  10 mg Oral QHS  . sodium chloride flush  3 mL Intravenous Q12H  . spironolactone  25 mg Oral Daily    acetaminophen **OR** acetaminophen, albuterol, alteplase, heparin  Exam: Elderly WM, disheveled, pleasant Weak and deconditioned No jvd Chest clear bilat RRR Abd soft/ ntnd Ext no edema gen'd weakness, nonfocal, Ox 2   Dialysis: SW TTS 4h  2/2.25 bath  Hep none   Dry wt ?      Assessment: 1. Altered mental status - prob UTI, better, to baseline 2. Debility/ falling - chronic issues 3. ESRD HD TTS 4. DM2 per primary 5. Hx heart transplant - cont meds 6. HTN - BP's up, added norvasc and changed metoprolol to labetalol 7. Possible UTI - on Rocephin 8. Vol is close to dry wt  Plan - HD tomorrow, min UF   [name] MD [place] pager [phone]   02/05/2017, 11:56 AM    Recent Labs Lab 02/03/17 1532 02/04/17 0725 02/05/17 0527  NA 137 138 137  K 5.1 3.8 4.0  CL 93* 98* 94*  CO2 30 30 29   GLUCOSE 146* 163* 91  BUN 57* 19 29*  CREATININE 8.19* 3.81* 5.58*  CALCIUM 8.8* 8.7* 8.8*    Recent Labs Lab 02/03/17 1532 02/04/17 0725  AST 17 18  ALT 16* 15*  ALKPHOS 122 122  BILITOT 0.8 0.6  PROT 5.4* 5.8*  ALBUMIN 3.2* 3.1*    Recent Labs Lab 02/03/17 1436 02/04/17 0725 02/05/17 0527  WBC 11.2* 10.3 7.3  NEUTROABS 8.1*  --   --   HGB 11.4* 10.8* 10.1*  HCT 35.5* 33.9* 32.7*  MCV 98.3 99.1 99.7  PLT 300 213 240   Iron/TIBC/Ferritin/ %Sat    Component Value Date/Time   IRON 38 (L) 08/25/2014 1831   TIBC 143 (L) 08/25/2014 1831   FERRITIN 612 (H) 08/25/2014 1831   IRONPCTSAT 27 08/25/2014 1831

## 2017-02-05 NOTE — NC FL2 (Signed)
[place] LEVEL OF CARE SCREENING TOOL     IDENTIFICATION  Patient Name: [name] [name] Birthdate: 03-09-1941 Sex: male Admission Date (Current Location): 02/03/2017  [facility] and [place] Number:  Herbalist and Address:  [facility] [place], [place] [address], [place], [place] [address]      Provider Number: [id number]  Attending Physician Name and Address:  Robbie Lis, MD  Relative Name and Phone Number:  [place]; [phone] (h); [phone] (mobile); Cordaro Mukai - son; [phone] (mobile)     Current Level of Care: Hospital Recommended Level of Care: [place] Prior Approval Number:    Date Approved/Denied:   PASRR Number: [id number]  Discharge Plan: SNF    Current Diagnoses: Patient Active Problem List   Diagnosis Date Noted  . Acute metabolic encephalopathy 10/62/6948  . Volume overload 02/03/2017  . Generalized weakness 08/22/2016  . Urinary tract infection   . Syncope and collapse   . Syncope 08/07/2016  . Faintness   . Uncontrolled hypertension   . Acute blood loss anemia   . Cardiomyopathy ([place])   . Controlled diabetes mellitus type 2 with complications ([place])   . 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 Traumatic subdural hematoma ([place]) 05/04/2016  . 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 Subdural hematoma ([place]) 05/02/2016  . CKD (chronic kidney disease)   . Protein-calorie malnutrition, severe ([place]) 04/23/2015  . Anemia of chronic disease 04/23/2015  . Depression 04/23/2015  . UTI (urinary tract infection) 04/22/2015  . Barrett's esophagus 03/22/2015  . ESRD on dialysis ([place]) 10/12/2014  . DM (diabetes mellitus), type 2 with renal complications ([place]) 54/62/7035  . Hypothyroidism 10/06/2014  . Heart transplanted ([place])   . Hypercholesterolemia 06/20/2014  . 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[place]) 06/20/2014    Orientation  RESPIRATION BLADDER Height & Weight     Self, Time, Situation, Place  Normal Continent Weight: 151 lb 10.8 oz (68.8 kg) Height:  6\' 2"  (188 cm)  BEHAVIORAL SYMPTOMS/MOOD NEUROLOGICAL BOWEL NUTRITION STATUS      Continent Diet (Heart healthy - Carb modified)  AMBULATORY STATUS COMMUNICATION OF NEEDS Skin   Total Care (Patient was unable to ambulate with PT during eval on 3/18.) Verbally Normal                       Personal Care Assistance Level of Assistance  Bathing, Feeding, Dressing Bathing Assistance: Maximum assistance Feeding assistance: Limited assistance (Needs assistance with set-up) Dressing Assistance: Maximum assistance     Functional Limitations Info  Sight, Hearing, Speech Sight Info: Adequate Hearing Info: Impaired Speech Info: Adequate    SPECIAL CARE FACTORS FREQUENCY  PT (By licensed PT), OT (By licensed OT)     PT Frequency: Evaluated 3/18 and a minimum of 2X per week therapy recommended OT Frequency: Evaluated 3/18 and a minimum of 2X per week therapy recommended            Contractures Contractures Info: Not present    Additional Factors Info  Code Status, Allergies, Insulin Sliding Scale Code Status Info: Full Allergies Info: Norvasc, Ace Inhibitors, Angiotensin Receptor Blockers   Insulin Sliding Scale Info: 0-9 Units 3X per day with meals; 0-5 Units daily at bedtime       Current Medications (02/05/2017):  This is the current hospital active medication list Current Facility-Administered Medications  Medication Dose Route Frequency Provider Last Rate Last Dose  . 0.9 %  sodium chloride infusion  100 mL Intravenous PRN Alric Seton, PA-C      . 0.9 %  sodium chloride infusion  100 mL Intravenous PRN Alric Seton, PA-C      . acetaminophen (TYLENOL) tablet 650 mg  650 mg Oral Q6H PRN Donne Hazel, MD   650 mg at 02/04/17 1749   Or  . acetaminophen (TYLENOL) suppository 650 mg  650 mg Rectal Q6H PRN Donne Hazel, MD      .  albuterol (PROVENTIL) (2.5 MG/3ML) 0.083% nebulizer solution 2.5 mg  2.5 mg Nebulization Q6H PRN Donne Hazel, MD      . alteplase (CATHFLO ACTIVASE) injection 2 mg  2 mg Intracatheter Once PRN Alric Seton, PA-C      . aspirin EC tablet 81 mg  81 mg Oral QHS Donne Hazel, MD   81 mg at 02/04/17 2330  . calcium acetate (PHOSLO) capsule 667 mg  667 mg Oral TID WC Donne Hazel, MD   667 mg at 02/05/17 0855  . cefTRIAXone (ROCEPHIN) 1 g in dextrose 5 % 50 mL IVPB  1 g Intravenous Q24H Rachel L Rumbarger, RPH   1 g at 02/04/17 1702  . cholecalciferol (VITAMIN D) tablet 1,000 Units  1,000 Units Oral Daily Donne Hazel, MD   1,000 Units at 02/04/17 1047  . cycloSPORINE (SANDIMMUNE) capsule 50 mg  50 mg Oral QHS Donne Hazel, MD   50 mg at 02/04/17 2329  . cycloSPORINE (SANDIMMUNE) capsule 75 mg  75 mg Oral q morning - 10a Donne Hazel, MD   75 mg at 02/04/17 1047  . donepezil (ARICEPT) tablet 10 mg  10 mg Oral QHS Donne Hazel, MD   10 mg at 02/04/17 2337  . FLUoxetine (PROZAC) capsule 20 mg  20 mg Oral Daily Donne Hazel, MD   20 mg at 02/04/17 1047  . heparin injection 1,000 Units  1,000 Units Dialysis PRN Alric Seton, PA-C      . heparin injection 5,000 Units  5,000 Units Subcutaneous Q8H Donne Hazel, MD   5,000 Units at 02/05/17 [phone]  . insulin aspart (novoLOG) injection 0-5 Units  0-5 Units Subcutaneous QHS Donne Hazel, MD      . insulin aspart (novoLOG) injection 0-9 Units  0-9 Units Subcutaneous TID WC Donne Hazel, MD   Stopped at 02/04/17 1300  . levothyroxine (SYNTHROID, LEVOTHROID) tablet 75 mcg  75 mcg Oral QAC breakfast Donne Hazel, MD   75 mcg at 02/05/17 [phone]  . lidocaine (PF) (XYLOCAINE) 1 % injection 5 mL  5 mL Intradermal PRN Alric Seton, PA-C      . lidocaine-prilocaine (EMLA) cream 1 application  1 application Topical PRN Alric Seton, PA-C      . metoprolol tartrate (LOPRESSOR) tablet 50 mg  50 mg Oral BID Donne Hazel, MD   50 mg at 02/04/17  2347  . multivitamin (RENA-VIT) tablet 1 tablet  1 tablet Oral Daily Donne Hazel, MD   1 tablet at 02/04/17 1047  . mycophenolate (CELLCEPT) capsule 250 mg  250 mg Oral Once per day on Tue Thu Sat Donne Hazel, MD      . mycophenolate (CELLCEPT) capsule 500 mg  500 mg Oral Daily Donne Hazel, MD   500 mg at 02/04/17 1048  . pantoprazole (PROTONIX) EC tablet 40 mg  40 mg Oral Daily Donne Hazel, MD   40 mg at 02/04/17 1047  .  pentafluoroprop-tetrafluoroeth (GEBAUERS) aerosol 1 application  1 application Topical PRN Alric Seton, PA-C      . pravastatin (PRAVACHOL) tablet 10 mg  10 mg Oral QHS Donne Hazel, MD   10 mg at 02/04/17 2330  . sodium chloride flush (NS) 0.9 % injection 3 mL  3 mL Intravenous Q12H Donne Hazel, MD   3 mL at 02/04/17 2350  . spironolactone (ALDACTONE) tablet 25 mg  25 mg Oral Daily Elmarie Shiley, MD   25 mg at 02/04/17 1048     Discharge Medications: Please see discharge summary for a list of discharge medications.  Relevant Imaging Results:  Relevant Lab Results:   Additional Information [phone]; Dialysis patient [place].  Sable Feil, LCSW

## 2017-02-05 NOTE — Progress Notes (Addendum)
Patient ID: [name] [name], male   DOB: 1941-05-22, 76 y.o.   MRN: [id number]  PROGRESS NOTE    [name] [name]  GYF:[id number] DOB: [birth year] DOA: 02/03/2017  PCP: Junie Panning, NP   Brief Narrative:  76 y.o. male with medical history significant for ESRD on TTS HD, DM2, hx heart transplant, poorly controlled HTN, HLD who presented to the ED with progressive generalized weakness and worsening confusion. Patient reportedly missed scheduled HD session on 3/15. In the ED, patient was noted to be volume overloaded. CXR was suggestive of volume overload. Cr noted to be 8.19. K was  5.1. BP was 270/110. Nephrology was consulted.   Assessment & Plan:  Acute toxic metabolic encephalopathy - Likely due to UTI as well as missed HD - No significant changes in mental status   E.Coli UTI - Continue rocephin  ESRD on TTS HD - Appreciate nephrology following - HD per renal schedule   Anemia of chronic disease - Due to ESRD - Hgb stable   Diabetes mellitus with diabetic nephropathy - Continue SSI  Hx heart transplant - Continue immunosuppressives per home regimen: cyclosporine and cellcept  Accelerated hypertension - Continue metoprolol and norvasc   Dyslipidemia associated with type 2 DM - Continue Pravachol   Depression - Continue Prozac - Continue Aricept   Hypothyroidism - Continue synthroid    DVT prophylaxis: Hep subQ Code Status: full code  Family Communication: no family at the bedside this am Disposition Plan: to SNF likely by 3/20    Consultants:   PT  Procedures:   None   Antimicrobials:   Rocephin 02/03/2017 --> 3/19  Cipro 3/19 -->    Subjective: No overnight events.  Objective: Vitals:   02/04/17 2027 02/05/17 0522 02/05/17 0846 02/05/17 0900  BP: (!) 202/74 (S) (!) 209/69 (!) 235/74 (!) 176/65  Pulse: 71 (!) 58 63 68  Resp: 16 17 16    Temp: 98.7 F (37.1 C) 98.4 F (36.9 C) 98.2 F (36.8 C)   TempSrc: Oral Oral Oral     SpO2: 100% 99% 100%   Weight: 68.8 kg (151 lb 10.8 oz)     Height:        Intake/Output Summary (Last 24 hours) at 02/05/17 1404 Last data filed at 02/05/17 1100  Gross per 24 hour  Intake              600 ml  Output                0 ml  Net              600 ml   Filed Weights   02/03/17 2203 02/04/17 0045 02/04/17 2027  Weight: 69.9 kg (154 lb 1.6 oz) 68.2 kg (150 lb 5.7 oz) 68.8 kg (151 lb 10.8 oz)    Examination:  General exam: No distress    Respiratory system: No wheezing, no rhonchi  Cardiovascular system: S1 & S2 heard, Rate controlled  Gastrointestinal system: (+) BS, non tender  Central nervous system: Nonfocal  Extremities: No swelling, palpable pulses  Skin: warm, dry  Psychiatry: Normal mood and behavior   Data Reviewed: I have personally reviewed following labs and imaging studies  CBC:  Recent Labs Lab 02/03/17 1436 02/04/17 0725 02/05/17 0527  WBC 11.2* 10.3 7.3  NEUTROABS 8.1*  --   --   HGB 11.4* 10.8* 10.1*  HCT 35.5* 33.9* 32.7*  MCV 98.3 99.1 99.7  PLT 300 213 916   Basic Metabolic Panel:  Recent  Labs Lab 02/03/17 1532 02/04/17 0725 02/05/17 0527  NA 137 138 137  K 5.1 3.8 4.0  CL 93* 98* 94*  CO2 30 30 29   GLUCOSE 146* 163* 91  BUN 57* 19 29*  CREATININE 8.19* 3.81* 5.58*  CALCIUM 8.8* 8.7* 8.8*   GFR: Estimated Creatinine Clearance: 11 mL/min (A) (by C-G formula based on SCr of 5.58 mg/dL (H)). Liver Function Tests:  Recent Labs Lab 02/03/17 1532 02/04/17 0725  AST 17 18  ALT 16* 15*  ALKPHOS 122 122  BILITOT 0.8 0.6  PROT 5.4* 5.8*  ALBUMIN 3.2* 3.1*   No results for input(s): LIPASE, AMYLASE in the last 168 hours. No results for input(s): AMMONIA in the last 168 hours. Coagulation Profile:  Recent Labs Lab 02/03/17 1436  INR 1.02   Cardiac Enzymes: No results for input(s): CKTOTAL, CKMB, CKMBINDEX, TROPONINI in the last 168 hours. BNP (last 3 results) No results for input(s): PROBNP in the last 8760  hours. HbA1C: No results for input(s): HGBA1C in the last 72 hours. CBG:  Recent Labs Lab 02/04/17 1246 02/04/17 1653 02/04/17 2026 02/05/17 0841 02/05/17 1235  GLUCAP 114* 129* 186* 94 151*   Lipid Profile: No results for input(s): CHOL, HDL, LDLCALC, TRIG, CHOLHDL, LDLDIRECT in the last 72 hours. Thyroid Function Tests: No results for input(s): TSH, T4TOTAL, FREET4, T3FREE, THYROIDAB in the last 72 hours. Anemia Panel: No results for input(s): VITAMINB12, FOLATE, FERRITIN, TIBC, IRON, RETICCTPCT in the last 72 hours. Urine analysis:    Component Value Date/Time   COLORURINE YELLOW 02/03/2017 1340   APPEARANCEUR CLOUDY (A) 02/03/2017 1340   LABSPEC 1.013 02/03/2017 1340   PHURINE 8.0 02/03/2017 1340   GLUCOSEU NEGATIVE 02/03/2017 1340   HGBUR MODERATE (A) 02/03/2017 1340   BILIRUBINUR NEGATIVE 02/03/2017 1340   KETONESUR NEGATIVE 02/03/2017 1340   PROTEINUR 100 (A) 02/03/2017 1340   UROBILINOGEN 1.0 04/22/2015 1952   NITRITE NEGATIVE 02/03/2017 1340   LEUKOCYTESUR LARGE (A) 02/03/2017 1340   Sepsis Labs: @LABRCNTIP (procalcitonin:4,lacticidven:4)   Urine culture     Status: Abnormal (Preliminary result)   Collection Time: 02/03/17  1:40 PM  Result Value Ref Range Status   Specimen Description URINE, CLEAN CATCH  Final   Special Requests NONE  Final   Culture >=100,000 COLONIES/mL GRAM NEGATIVE RODS (A)  Final   Report Status PENDING  Incomplete  MRSA PCR Screening     Status: None   Collection Time: 02/03/17  6:42 PM  Result Value Ref Range Status   MRSA by PCR NEGATIVE NEGATIVE Final      Radiology Studies: Dg Chest 2 View Result Date: 02/03/2017 Opacification over the right mid to lower lung centered over the major fissure with mild opacification the left suprahilar region. Right lung findings may represent fluid within the fissure. Cannot exclude atelectasis versus infection over the left suprahilar region. Consider noncontrast chest CT for further  evaluation.   Ct Head Wo Contrast Result Date: 02/03/2017 1. No acute abnormality. 2. Resolved right lateral subdural hematoma. 3. Mildly progressive atrophy and chronic small vessel white matter ischemic changes. 4. Cervical spine degenerative changes. 5. Dense bilateral carotid artery atheromatous calcifications. 6. Right pleural effusion. 7. COPD. Electronically Signed   By: Claudie Revering M.D.   On: 02/03/2017 15:24   Ct Cervical Spine Wo Contrast Result Date: 02/03/2017 1. No acute abnormality. 2. Resolved right lateral subdural hematoma. 3. Mildly progressive atrophy and chronic small vessel white matter ischemic changes. 4. Cervical spine degenerative changes. 5. Dense bilateral carotid  artery atheromatous calcifications. 6. Right pleural effusion. 7. COPD. Electronically Signed   By: Claudie Revering M.D.   On: 02/03/2017 15:24   Dg Chest Port 1 View Result Date: 02/04/2017 1. Improved left suprahilar atelectasis or pneumonia. 2. Stable small right pleural effusion with partial loculation. 3. Mild changes of COPD.    Scheduled Meds: . amLODipine  5 mg Oral BID  . aspirin EC  81 mg Oral QHS  . calcium acetate  667 mg Oral TID WC  . cefTRIAXone (ROCEPHIN)  IV  1 g Intravenous Q24H  . cholecalciferol  1,000 Units Oral Daily  . cycloSPORINE  50 mg Oral QHS  . cycloSPORINE  75 mg Oral q morning - 10a  . donepezil  10 mg Oral QHS  . FLUoxetine  20 mg Oral Daily  . heparin  5,000 Units Subcutaneous Q8H  . insulin aspart  0-5 Units Subcutaneous QHS  . insulin aspart  0-9 Units Subcutaneous TID WC  . labetalol  300 mg Oral BID  . levothyroxine  75 mcg Oral QAC breakfast  . multivitamin  1 tablet Oral Daily  . mycophenolate  250 mg Oral Once per day on Tue Thu Sat  . mycophenolate  500 mg Oral Daily  . pantoprazole  40 mg Oral Daily  . pravastatin  10 mg Oral QHS  . sodium chloride flush  3 mL Intravenous Q12H  . spironolactone  25 mg Oral Daily   Continuous Infusions:   LOS: 1 day     Time spent: 15 minutes  Greater than 50% of the time spent on counseling and coordinating the care.   Leisa Lenz, MD [facility] Pager [phone]  If 7PM-7AM, please contact night-coverage [email or web address] Password [email or web address] 02/05/2017, 2:04 PM

## 2017-02-06 LAB — GLUCOSE, CAPILLARY
GLUCOSE-CAPILLARY: 189 mg/dL — AB (ref 65–99)
GLUCOSE-CAPILLARY: 174 mg/dL — AB (ref 65–99)
Glucose-Capillary: 129 mg/dL — ABNORMAL HIGH (ref 65–99)
Glucose-Capillary: 163 mg/dL — ABNORMAL HIGH (ref 65–99)

## 2017-02-06 MED ORDER — METOPROLOL TARTRATE 50 MG PO TABS
50.0000 mg | ORAL_TABLET | Freq: Two times a day (BID) | ORAL | Status: DC
  Administered 2017-02-06: 50 mg via ORAL
  Filled 2017-02-06: qty 1

## 2017-02-06 MED ORDER — LIDOCAINE HCL (PF) 1 % IJ SOLN: 5.0000 mL | INTRAMUSCULAR | Status: DC | PRN

## 2017-02-06 MED ORDER — PENTAFLUOROPROP-TETRAFLUOROETH EX AERO: 1.0000 "application " | INHALATION_SPRAY | CUTANEOUS | Status: DC | PRN

## 2017-02-06 MED ORDER — LIDOCAINE-PRILOCAINE 2.5-2.5 % EX CREA: 1.0000 "application " | TOPICAL_CREAM | CUTANEOUS | Status: DC | PRN

## 2017-02-06 MED ORDER — HEPARIN SODIUM (PORCINE) 1000 UNIT/ML DIALYSIS: 1000.0000 [IU] | INTRAMUSCULAR | Status: DC | PRN

## 2017-02-06 MED ORDER — AMLODIPINE BESYLATE 5 MG PO TABS: 5.0000 mg | ORAL_TABLET | Freq: Two times a day (BID) | ORAL | 0 refills | Status: DC

## 2017-02-06 MED ORDER — HYDRALAZINE HCL 25 MG PO TABS
25.0000 mg | ORAL_TABLET | Freq: Once | ORAL | Status: AC
Start: 2017-02-06 — End: 2017-02-06
  Administered 2017-02-06: 25 mg via ORAL
  Filled 2017-02-06: qty 1

## 2017-02-06 MED ORDER — HYDRALAZINE HCL 20 MG/ML IJ SOLN: 5.0000 mg | Freq: Once | INTRAMUSCULAR | Status: DC

## 2017-02-06 MED ORDER — SODIUM CHLORIDE 0.9 % IV SOLN: 100.0000 mL | INTRAVENOUS | Status: DC | PRN

## 2017-02-06 MED ORDER — CIPROFLOXACIN HCL 500 MG PO TABS: 500.0000 mg | ORAL_TABLET | Freq: Every day | ORAL | 0 refills | Status: DC

## 2017-02-06 MED ORDER — ALTEPLASE 2 MG IJ SOLR: 2.0000 mg | Freq: Once | INTRAMUSCULAR | Status: DC | PRN

## 2017-02-06 NOTE — Progress Notes (Signed)
Patient's BP 185/63 HR 77,K. Schorr,NP notified. Order received. Also called to let on call MD,patient needs prescription for cipro as facility didn't receive it. Raliegh Ip Schorr to have primary MD have it be faxed in am. 20:45 [facility] transported patient to [facility] .No other belonging except for small pillow,pt's ring with him Sherisa Gilvin, Wonda Cheng, RN

## 2017-02-06 NOTE — Progress Notes (Signed)
Physical Therapy Treatment Patient Details Name: [name] [name] MRN: [id number] DOB: [birth year] Today's Date: 02/06/2017    History of Present Illness 76 y.o. male with medical history significant of ESRD on TTS HD, DM2, hx heart transplant, poorly controlled HTN, HLD who presented to the ED with comlaints of 2 weeks of generalized weakness and worsening confusion. Patient reportedly missed scheduled HD session on 3/15.    PT Comments    Pt appears to be improving slowly, limited somewhat by fatigue and pt's unwillingness to push himself.  Emphasis on getting to EOB, sitting balance, sit to stand, transfers and progressing to pregait activity.   Follow Up Recommendations  SNF;Supervision/Assistance - 24 hour     Equipment Recommendations  None recommended by PT    Recommendations for Other Services       Precautions / Restrictions Precautions Precautions: Fall    Mobility  Bed Mobility Overal bed mobility: Needs Assistance Bed Mobility: Rolling;Sidelying to Sit Rolling: Min assist Sidelying to sit: Mod assist;HOB elevated       General bed mobility comments: pt needed truncal support against listing left in order to get L UE up under her.  Pt could scoot to EOB, but needed the truncal support  Transfers Overall transfer level: Needs assistance Equipment used: Rolling walker (2 wheeled) Transfers: Sit to/from [facility] Sit to Stand: Mod assist Stand pivot transfers: Mod assist;+2 safety/equipment       General transfer comment: pt wouldn't wait initially for me to get the RW to transfer so assisted with face to face transfer, pt's legs pivoting, but not fully extended.  To stand, pt needed assist both forward and to boost to upright stance.  Ambulation/Gait             General Gait Details: unable to get pt to ambulate, but he did march in place inside the RW with min assist   Stairs            Wheelchair Mobility    Modified  [place] (Stroke Patients Only)       Balance Overall balance assessment: Needs assistance Sitting-balance support: Bilateral upper extremity supported;Single extremity supported Sitting balance-Leahy Scale: Poor Sitting balance - Comments: leans left needing L UE to push back to midline   Standing balance support: Bilateral upper extremity supported Standing balance-Leahy Scale: Poor Standing balance comment: reliant on the RW.  Sit to stand x3 working on sit to stand, upright stance and balance plus beginning of pregait activity.                    Cognition Arousal/Alertness: Awake/alert Behavior During Therapy: WFL for tasks assessed/performed;Flat affect Overall Cognitive Status: No family/caregiver present to determine baseline cognitive functioning                      Exercises      General Comments        Pertinent Vitals/Pain Pain Assessment: Faces Faces Pain Scale: No hurt    Home Living                      Prior Function            PT Goals (current goals can now be found in the care plan section) Acute Rehab PT Goals Patient Stated Goal: did not state PT Goal Formulation: With patient Time For Goal Achievement: 02/18/17 Potential to Achieve Goals: Fair Progress towards PT goals: Progressing toward goals  Frequency    Min 2X/week      PT Plan Current plan remains appropriate    Co-evaluation             End of Session   Activity Tolerance: Patient limited by fatigue;Patient tolerated treatment well Patient left: in chair;with call bell/phone within reach;with chair alarm set Nurse Communication: Mobility status PT Visit Diagnosis: Difficulty in walking, not elsewhere classified (R26.2);Muscle weakness (generalized) (M62.81)     Time: 5797-2820 PT Time Calculation (min) (ACUTE ONLY): 22 min  Charges:  $Therapeutic Activity: 8-22 mins                    G CodesTessie Fass Davinity Fanara 02/06/2017,  3:37 PM 02/06/2017  Donnella Sham, PT [phone]  (pager)

## 2017-02-06 NOTE — Progress Notes (Signed)
[facility] Progress Note   Subjective: no c/o's.   Vitals:   02/05/17 2150 02/06/17 0535 02/06/17 0842 02/06/17 0857  BP: (!) 162/55 (!) 179/58 (!) 199/81 (!) 193/85  Pulse: 69 68 73 69  Resp: 18 19 18    Temp: 98.4 F (36.9 C) 98 F (36.7 C) 97.4 F (36.3 C)   TempSrc: Oral Oral Oral   SpO2: 98% 99% 98%   Weight:   65.5 kg (144 lb 6.4 oz)   Height:        Inpatient medications: . amLODipine  5 mg Oral BID  . aspirin EC  81 mg Oral QHS  . calcium acetate  667 mg Oral TID WC  . cholecalciferol  1,000 Units Oral Daily  . ciprofloxacin  500 mg Oral Daily  . cycloSPORINE  50 mg Oral QHS  . cycloSPORINE  75 mg Oral q morning - 10a  . donepezil  10 mg Oral QHS  . FLUoxetine  20 mg Oral Daily  . heparin  5,000 Units Subcutaneous Q8H  . insulin aspart  0-5 Units Subcutaneous QHS  . insulin aspart  0-9 Units Subcutaneous TID WC  . labetalol  300 mg Oral BID  . levothyroxine  75 mcg Oral QAC breakfast  . multivitamin  1 tablet Oral Daily  . mycophenolate  250 mg Oral Once per day on Tue Thu Sat  . mycophenolate  500 mg Oral Daily  . pantoprazole  40 mg Oral Daily  . pravastatin  10 mg Oral QHS  . sodium chloride flush  3 mL Intravenous Q12H  . spironolactone  25 mg Oral Daily    acetaminophen **OR** acetaminophen, albuterol, alteplase, heparin  Exam: Elderly WM, disheveled, pleasant Weak and deconditioned No jvd Chest clear bilat RRR Abd soft/ ntnd Ext no edema gen'd weakness, nonfocal, Ox 2   Dialysis: SW TTS 4h  2/2.25 bath  Hep none   68.5kg     Assessment: 1. Altered mental status/ gen'd weakness - due to missed HD/ UTI/ chronic decondition 2. EColi UTI - on abx per primary 3. ESRD HD TTS 4. DM2 per primary 5. Hx heart transplant - cont meds 6. HTN - BP's up, added norvasc and changed metoprolol to labetalol 7. Vol - is under dry wt, possibly losing wt  Plan - HD today, UF 2.5kg, get BP's down. Will get OOB to chair after HD, see if he  can tolerate sitting in chair.     [name] MD [facility] pager [phone]   02/06/2017, 10:06 AM    Recent Labs Lab 02/03/17 1532 02/04/17 0725 02/05/17 0527  NA 137 138 137  K 5.1 3.8 4.0  CL 93* 98* 94*  CO2 30 30 29   GLUCOSE 146* 163* 91  BUN 57* 19 29*  CREATININE 8.19* 3.81* 5.58*  CALCIUM 8.8* 8.7* 8.8*    Recent Labs Lab 02/03/17 1532 02/04/17 0725  AST 17 18  ALT 16* 15*  ALKPHOS 122 122  BILITOT 0.8 0.6  PROT 5.4* 5.8*  ALBUMIN 3.2* 3.1*    Recent Labs Lab 02/03/17 1436 02/04/17 0725 02/05/17 0527  WBC 11.2* 10.3 7.3  NEUTROABS 8.1*  --   --   HGB 11.4* 10.8* 10.1*  HCT 35.5* 33.9* 32.7*  MCV 98.3 99.1 99.7  PLT 300 213 240   Iron/TIBC/Ferritin/ %Sat    Component Value Date/Time   IRON 38 (L) 08/25/2014 1831   TIBC 143 (L) 08/25/2014 1831   FERRITIN 612 (H) 08/25/2014 1831  IRONPCTSAT 27 08/25/2014 1831

## 2017-02-06 NOTE — Clinical Social Work Placement (Signed)
   CLINICAL SOCIAL WORK PLACEMENT  NOTE 02/06/17 - DISCHARGED TO [place]   Date:  02/06/2017  Patient Details  Name: [name] [name] MRN: [id number] Date of Birth: 23-Dec-1940  Clinical Social Work is seeking post-discharge placement for this patient at the [place] level of care (*CSW will initial, date and re-position this form in  chart as items are completed):  No (Wife provided CSW with facility preferences)   Patient/family provided with [facility] Work Department's list of facilities offering this level of care within the geographic area requested by the patient (or if unable, by the patient's family).  Yes   Patient/family informed of their freedom to choose among providers that offer the needed level of care, that participate in Medicare, Medicaid or managed care program needed by the patient, have an available bed and are willing to accept the patient.  No   Patient/family informed of [facility]'s ownership interest in [facility] and [facility], as well as of the fact that they are under no obligation to receive care at these facilities.  PASRR submitted to EDS on       PASRR number received on       Existing PASRR number confirmed on 02/05/17     FL2 transmitted to all facilities in geographic area requested by pt/family on 02/05/17     FL2 transmitted to all facilities within larger geographic area on       Patient informed that his/her managed care company has contracts with or will negotiate with certain facilities, including the following:        Yes   Patient/family informed of bed offers received.  Patient chooses bed at [facility]     Physician recommends and patient chooses bed at      Patient to be transferred to [facility] on 02/06/17.  Patient to be transferred to facility by Ambulance     Patient family notified on 02/06/17 of transfer.  Name of family  member notified:  Wife, Garik Diamant     PHYSICIAN       Additional Comment:  02/06/17 - Received insurance authorization at 10:38 am from [place] with [employer]: Josem Kaufmann #[id number] eff. 02/06/17.   _______________________________________________ Sable Feil, LCSW 02/06/2017, 11:32 AM

## 2017-02-06 NOTE — Discharge Summary (Addendum)
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 Patient reportedly missed scheduled HD session on 3/15. In the ED, patient was noted to be volume overloaded. CXR was suggestive of volume overload. Cr noted to be 8.19. K was  5.1. BP was 270/110. Nephrology was consulted.   Hospital Course:  Acute toxic metabolic encephalopathy - Likely due to UTI as well as missed HD - Stable mental status, confused intermittently    E.Coli UTI - Continue cipro for 5 days on discharge   ESRD on TTS HD - Appreciate nephrology following - HD per renal schedule   Anemia of chronic disease - Due to ESRD - Hgb stable   Diabetes mellitus with diabetic nephropathy - Diet controlled   Hx heart transplant - Continue immunosuppressives per home regimen: cyclosporine and cellcept  Accelerated hypertension - Continue metoprolol and norvasc   Dyslipidemia associated with type 2 DM - Continue Pravachol   Depression - Continue Prozac - Continue Aricept   Hypothyroidism - Continue synthroid    DVT prophylaxis: Hep subQ Code Status: full code   Family Communication: no family at the bedside this am    Consultants:   PT  Procedures:   None   Antimicrobials:   Rocephin 02/03/2017 --> 3/19  Cipro 3/19 and continue for 5 days on discharge    Signed:  Leisa Lenz, MD  [facility] 02/06/2017, 10:27 AM  Pager #: [phone]  Time spent in minutes: less than 30 minutes    Discharge Exam: Vitals:   02/06/17 1000 02/06/17 1015  BP: (!) 175/79 (!) 171/78  Pulse: 71 73  Resp:    Temp:     Vitals:   02/06/17 0857 02/06/17 0930 02/06/17 1000 02/06/17 1015  BP: (!) 193/85 (!) 177/90 (!) 175/79 (!) 171/78  Pulse: 69 75 71 73  Resp:      Temp:      TempSrc:      SpO2:      Weight:      Height:        General: Pt is alert, follows commands appropriately, not in acute distress Cardiovascular: Regular rate and rhythm, S1/S2 + Respiratory: Clear to auscultation bilaterally, no wheezing, no crackles, no rhonchi Abdominal: Soft, non tender, non distended, bowel sounds +, no guarding Extremities: no cyanosis, pulses palpable bilaterally DP and PT Neuro: Grossly nonfocal  Discharge Instructions  Discharge Instructions    Call MD for:  persistant nausea and vomiting    Complete by:  As directed    Call MD for:  redness, tenderness, or signs of infection (pain, swelling, redness, odor or green/yellow discharge around incision site)    Complete by:  As directed    Call MD for:  severe uncontrolled  pain    Complete by:  As directed    Diet - low sodium heart healthy    Complete by:  As directed    Discharge instructions    Complete by:  As directed    Continue cipro for 5 days on discharge for UTI   Increase activity slowly    Complete by:  As directed      Allergies as of 02/06/2017   No Known Allergies     Medication List    STOP taking these medications   predniSONE 20 MG tablet Commonly known as:  DELTASONE     TAKE these medications   acetaminophen 325 MG tablet Commonly known as:   TYLENOL Take 2 tablets (650 mg total) by mouth every 6 (six) hours as needed for mild pain (or Fever >/= 101). What changed:  reasons to take this   albuterol (2.5 MG/3ML) 0.083% nebulizer solution Commonly known as:  PROVENTIL Take 3 mLs (2.5 mg total) by nebulization every 6 (six) hours as needed for wheezing or shortness of breath. What changed:  Another medication with the same name was removed. Continue taking this medication, and follow the directions you see here.   amLODipine 5 MG tablet Commonly known as:  NORVASC Take 1 tablet (5 mg total) by mouth 2 (two) times daily.   aspirin EC 81 MG tablet Take 81 mg by mouth at bedtime.   calcium acetate 667 MG capsule Commonly known as:  PHOSLO Take 1 capsule (667 mg total) by mouth 3 (three) times daily with meals. What changed:  when to take this   cholecalciferol 1000 units tablet Commonly known as:  VITAMIN D Take 1,000 Units by mouth daily.   ciprofloxacin 500 MG tablet Commonly known as:  CIPRO Take 1 tablet (500 mg total) by mouth daily.   cycloSPORINE modified 25 MG capsule Commonly known as:  NEORAL Take 50-75 mg by mouth 2 (two) times daily. 75 mg in the morning and 50 mg in the evening   diphenoxylate-atropine 2.5-0.025 MG tablet Commonly known as:  LOMOTIL Take 1 tablet by mouth 4 (four) times daily as needed for diarrhea or loose stools.   donepezil 10 MG tablet Commonly known as:  ARICEPT Take 10 mg by mouth at bedtime.   FLUoxetine 20 MG capsule Commonly known as:  PROZAC Take 20 mg by mouth daily.   fluticasone 50 MCG/ACT nasal spray Commonly known as:  FLONASE Place 2 sprays into both nostrils at bedtime as needed for allergies.   lansoprazole 30 MG capsule Commonly known as:  PREVACID Take 30 mg by mouth See admin instructions. Take 1 capsule (30 mg) by mouth twice daily on Sunday, Monday, Wednesday, Friday (non-dialysis days)   levothyroxine 75 MCG tablet Commonly known as:  SYNTHROID,  LEVOTHROID Take 75 mcg by mouth daily.   metoprolol tartrate 25 MG tablet Commonly known as:  LOPRESSOR Take 2 tablets (50 mg total) by mouth 2 (two) times daily.   multivitamin Tabs tablet Take 1 tablet by mouth daily.   mycophenolate 250 MG capsule Commonly known as:  CELLCEPT Take 250-500 mg by mouth daily. Tuesday Thursday and Saturday only take evening dose of 250 mg.  morning dose of 500 mg   pravastatin 10 MG tablet Commonly known as:  PRAVACHOL Take 10 mg by mouth at bedtime.   PRO-BIOTIC BLEND Caps Take 1 capsule by mouth daily.   vitamin B-12 1000 MCG tablet Commonly known as:  CYANOCOBALAMIN Take 1,000 mcg by mouth daily  with breakfast.        Contact information for follow-up providers    Smothers, Andree Elk, NP. Schedule an appointment as soon as possible for a visit.   Specialty:  Nurse Practitioner Contact information: [address] [place] [address] [phone]            Contact information for after-discharge care    Destination    [facility] Follow up.   Specialty:  [place] Why:  Nursing home will provide appointments as schduled. Thank you. Contact information: [place] [phone]                   The results of significant diagnostics from this hospitalization (including imaging, microbiology, ancillary and laboratory) are listed below for reference.    Significant Diagnostic Studies: Dg Chest 2 View  Result Date: 02/03/2017 CLINICAL DATA:  Weakness with mid chest pain and right neck pain several weeks. EXAM: CHEST  2 VIEW COMPARISON:  11/09/2016 scan 10/05/2014 as well as chest CT 10/05/2014 FINDINGS: Sternotomy wires unchanged. Lungs are adequately inflated with opacification over the right mid to lower lung over the region of the major fissure. Also minimal hazy opacification in the left suprahilar region. Findings are  new compared to 11/09/2016, although not significantly changed from 10/05/2014. Cardiomediastinal silhouette and remainder of the exam is unchanged. IMPRESSION: Opacification over the right mid to lower lung centered over the major fissure with mild opacification the left suprahilar region. Right lung findings may represent fluid within the fissure. Cannot exclude atelectasis versus infection over the left suprahilar region. Consider noncontrast chest CT for further evaluation. Electronically Signed   By: Marin Olp M.D.   On: 02/03/2017 14:11   Ct Head Wo Contrast  Result Date: 02/03/2017 CLINICAL DATA:  Generalized weakness. Fell yesterday and hit the right side of his head on the floor. Right neck pain. Previous brain surgery. EXAM: CT HEAD WITHOUT CONTRAST CT CERVICAL SPINE WITHOUT CONTRAST TECHNIQUE: Multidetector CT imaging of the head and cervical spine was performed following the standard protocol without intravenous contrast. Multiplanar CT image reconstructions of the cervical spine were also generated. COMPARISON:  Head and cervical spine CT dated 06/27/2016 and brain MR dated 08/09/2016. FINDINGS: CT HEAD FINDINGS Brain: Diffusely enlarged ventricles and subarachnoid spaces. Patchy white matter low density in both cerebral hemispheres and in the pons without significant change. No intracranial hemorrhage, mass lesion or CT evidence of acute infarction. Vascular: No hyperdense vessel or unexpected calcification. Skull: Stable post craniotomy changes on the right. Stable right burr hole. No fracture. Sinuses/Orbits: No acute finding. Other: None. CT CERVICAL SPINE FINDINGS Alignment: Normal. Skull base and vertebrae: No acute fracture. No primary bone lesion or focal pathologic process. Soft tissues and spinal canal: No prevertebral fluid or swelling. No visible canal hematoma. Disc levels:  Stable multilevel degenerative changes. Upper chest: Right apical bullous changes.  Right pleural effusion.  Other: Bilateral dense carotid artery calcifications. IMPRESSION: 1. No acute abnormality. 2. Resolved right lateral subdural hematoma. 3. Mildly progressive atrophy and chronic small vessel white matter ischemic changes. 4. Cervical spine degenerative changes. 5. Dense bilateral carotid artery atheromatous calcifications. 6. Right pleural effusion. 7. COPD. Electronically Signed   By: Claudie Revering M.D.   On: 02/03/2017 15:24   Ct Cervical Spine Wo Contrast  Result Date: 02/03/2017 CLINICAL DATA:  Generalized weakness. Fell yesterday and hit the right side of his head on the floor. Right  neck pain. Previous brain surgery. EXAM: CT HEAD WITHOUT CONTRAST CT CERVICAL SPINE WITHOUT CONTRAST TECHNIQUE: Multidetector CT imaging of the head and cervical spine was performed following the standard protocol without intravenous contrast. Multiplanar CT image reconstructions of the cervical spine were also generated. COMPARISON:  Head and cervical spine CT dated 06/27/2016 and brain MR dated 08/09/2016. FINDINGS: CT HEAD FINDINGS Brain: Diffusely enlarged ventricles and subarachnoid spaces. Patchy white matter low density in both cerebral hemispheres and in the pons without significant change. No intracranial hemorrhage, mass lesion or CT evidence of acute infarction. Vascular: No hyperdense vessel or unexpected calcification. Skull: Stable post craniotomy changes on the right. Stable right burr hole. No fracture. Sinuses/Orbits: No acute finding. Other: None. CT CERVICAL SPINE FINDINGS Alignment: Normal. Skull base and vertebrae: No acute fracture. No primary bone lesion or focal pathologic process. Soft tissues and spinal canal: No prevertebral fluid or swelling. No visible canal hematoma. Disc levels:  Stable multilevel degenerative changes. Upper chest: Right apical bullous changes.  Right pleural effusion. Other: Bilateral dense carotid artery calcifications. IMPRESSION: 1. No acute abnormality. 2. Resolved right  lateral subdural hematoma. 3. Mildly progressive atrophy and chronic small vessel white matter ischemic changes. 4. Cervical spine degenerative changes. 5. Dense bilateral carotid artery atheromatous calcifications. 6. Right pleural effusion. 7. COPD. Electronically Signed   By: Claudie Revering M.D.   On: 02/03/2017 15:24   Dg Chest Port 1 View  Result Date: 02/04/2017 CLINICAL DATA:  Shortness of breath. EXAM: PORTABLE CHEST 1 VIEW COMPARISON:  Yesterday. FINDINGS: Normal sized heart. Stable post CABG changes and aortic calcification. No significant change in a small right pleural effusion with partial loculation in the major fissure. Decreased left suprahilar airspace opacity with some residual linear density. The lungs remain hyperexpanded. A left axillary stent graft is again demonstrated. Diffuse osteopenia. IMPRESSION: 1. Improved left suprahilar atelectasis or pneumonia. 2. Stable small right pleural effusion with partial loculation. 3. Mild changes of COPD. Electronically Signed   By: Claudie Revering M.D.   On: 02/04/2017 08:50    Microbiology: Recent Results (from the past 240 hour(s))  Urine culture     Status: Abnormal   Collection Time: 02/03/17  1:40 PM  Result Value Ref Range Status   Specimen Description URINE, CLEAN CATCH  Final   Special Requests NONE  Final   Culture >=100,000 COLONIES/mL ESCHERICHIA COLI (A)  Final   Report Status 02/05/2017 FINAL  Final   Organism ID, Bacteria ESCHERICHIA COLI (A)  Final      Susceptibility   Escherichia coli - MIC*    AMPICILLIN >=32 RESISTANT Resistant     CEFAZOLIN >=64 RESISTANT Resistant     CEFTRIAXONE 16 INTERMEDIATE Intermediate     CIPROFLOXACIN <=0.25 SENSITIVE Sensitive     GENTAMICIN <=1 SENSITIVE Sensitive     IMIPENEM <=0.25 SENSITIVE Sensitive     NITROFURANTOIN <=16 SENSITIVE Sensitive     TRIMETH/SULFA <=20 SENSITIVE Sensitive     AMPICILLIN/SULBACTAM >=32 RESISTANT Resistant     PIP/TAZO 8 SENSITIVE Sensitive     Extended  ESBL NEGATIVE Sensitive     * >=100,000 COLONIES/mL ESCHERICHIA COLI  MRSA PCR Screening     Status: None   Collection Time: 02/03/17  6:42 PM  Result Value Ref Range Status   MRSA by PCR NEGATIVE NEGATIVE Final    Comment:        The GeneXpert MRSA Assay (FDA approved for NASAL specimens only), is one component of a comprehensive MRSA colonization surveillance program.  It is not intended to diagnose MRSA infection nor to guide or monitor treatment for MRSA infections.      Labs: Basic Metabolic Panel:  Recent Labs Lab 02/03/17 1532 02/04/17 0725 02/05/17 0527  NA 137 138 137  K 5.1 3.8 4.0  CL 93* 98* 94*  CO2 30 30 29   GLUCOSE 146* 163* 91  BUN 57* 19 29*  CREATININE 8.19* 3.81* 5.58*  CALCIUM 8.8* 8.7* 8.8*   Liver Function Tests:  Recent Labs Lab 02/03/17 1532 02/04/17 0725  AST 17 18  ALT 16* 15*  ALKPHOS 122 122  BILITOT 0.8 0.6  PROT 5.4* 5.8*  ALBUMIN 3.2* 3.1*   No results for input(s): LIPASE, AMYLASE in the last 168 hours. No results for input(s): AMMONIA in the last 168 hours. CBC:  Recent Labs Lab 02/03/17 1436 02/04/17 0725 02/05/17 0527  WBC 11.2* 10.3 7.3  NEUTROABS 8.1*  --   --   HGB 11.4* 10.8* 10.1*  HCT 35.5* 33.9* 32.7*  MCV 98.3 99.1 99.7  PLT 300 213 240   Cardiac Enzymes: No results for input(s): CKTOTAL, CKMB, CKMBINDEX, TROPONINI in the last 168 hours. BNP: BNP (last 3 results) No results for input(s): BNP in the last 8760 hours.  ProBNP (last 3 results) No results for input(s): PROBNP in the last 8760 hours.  CBG:  Recent Labs Lab 02/05/17 0841 02/05/17 1235 02/05/17 1714 02/05/17 2152 02/06/17 0754  GLUCAP 94 151* 160* 157* 129*

## 2017-02-06 NOTE — Progress Notes (Signed)
Assisted patient to recliner, in order to test endurance for sitting in the chair.  PT assisted.  Jillyn Ledger, MBA, BSN, RN\

## 2017-02-06 NOTE — Discharge Instructions (Signed)
Hypertension °Hypertension is another name for high blood pressure. High blood pressure forces your heart to work harder to pump blood. This can cause problems over time. °There are two numbers in a blood pressure reading. There is a top number (systolic) over a bottom number (diastolic). It is best to have a blood pressure below 120/80. Healthy choices can help lower your blood pressure. You may need medicine to help lower your blood pressure if: °· Your blood pressure cannot be lowered with healthy choices. °· Your blood pressure is higher than 130/80. °Follow these instructions at home: °Eating and drinking  °· If directed, follow the DASH eating plan. This diet includes: °¨ Filling half of your plate at each meal with fruits and vegetables. °¨ Filling one quarter of your plate at each meal with whole grains. Whole grains include whole wheat pasta, brown rice, and whole grain bread. °¨ Eating or drinking low-fat dairy products, such as skim milk or low-fat yogurt. °¨ Filling one quarter of your plate at each meal with low-fat (lean) proteins. Low-fat proteins include fish, skinless chicken, eggs, beans, and tofu. °¨ Avoiding fatty meat, cured and processed meat, or chicken with skin. °¨ Avoiding premade or processed food. °· Eat less than 1,500 mg of salt (sodium) a day. °· Limit alcohol use to no more than 1 drink a day for nonpregnant women and 2 drinks a day for men. One drink equals 12 oz of beer, 5 oz of wine, or 1½ oz of hard liquor. °Lifestyle  °· Work with your doctor to stay at a healthy weight or to lose weight. Ask your doctor what the best weight is for you. °· Get at least 30 minutes of exercise that causes your heart to beat faster (aerobic exercise) most days of the week. This may include walking, swimming, or biking. °· Get at least 30 minutes of exercise that strengthens your muscles (resistance exercise) at least 3 days a week. This may include lifting weights or pilates. °· Do not use any  products that contain nicotine or tobacco. This includes cigarettes and e-cigarettes. If you need help quitting, ask your doctor. °· Check your blood pressure at home as told by your doctor. °· Keep all follow-up visits as told by your doctor. This is important. °Medicines  °· Take over-the-counter and prescription medicines only as told by your doctor. Follow directions carefully. °· Do not skip doses of blood pressure medicine. The medicine does not work as well if you skip doses. Skipping doses also puts you at risk for problems. °· Ask your doctor about side effects or reactions to medicines that you should watch for. °Contact a doctor if: °· You think you are having a reaction to the medicine you are taking. °· You have headaches that keep coming back (recurring). °· You feel dizzy. °· You have swelling in your ankles. °· You have trouble with your vision. °Get help right away if: °· You get a very bad headache. °· You start to feel confused. °· You feel weak or numb. °· You feel faint. °· You get very bad pain in your: °¨ Chest. °¨ Belly (abdomen). °· You throw up (vomit) more than once. °· You have trouble breathing. °Summary °· Hypertension is another name for high blood pressure. °· Making healthy choices can help lower blood pressure. If your blood pressure cannot be controlled with healthy choices, you may need to take medicine. °This information is not intended to replace advice given to you by your   health care provider. Make sure you discuss any questions you have with your health care provider. °Document Released: 04/24/2008 Document Revised: 10/04/2016 Document Reviewed: 10/04/2016 °Elsevier Interactive Patient Education © 2017 [employer] Inc[employer] ° °

## 2017-02-06 NOTE — Clinical Social Work Note (Signed)
Clinical Social Work Assessment  Patient Details  Name: [name] [name] MRN: [id number] Date of Birth: February 24, 1941  Date of referral:  02/04/17               Reason for consult:  Facility Placement                Permission sought to share information with:  Family Supports Permission granted to share information::  No (Patient was asleep and did not awaken when called)  Name::     [name] [name]  Agency::     Relationship::  Wife  Contact Information:  [phone]  Housing/Transportation Living arrangements for the past 2 months:  Apartment Source of Information:  Spouse [name]) Patient Interpreter Needed:  None Criminal Activity/Legal Involvement Pertinent to Current Situation/Hospitalization:  No - Comment as needed Significant Relationships:  Spouse, Adult Children Lives with:  Spouse Do you feel safe going back to the place where you live?  No (Wife in agreement with ST rehab prior to patient returning home) Need for family participation in patient care:  Yes (Comment)  Care giving concerns: Wife reported that patient needs rehab before coming home and provided CSW with facility preferences.  Social Worker assessment / plan:  CSW talked with [name] by phone regarding discharge disposition and recommendation of ST rehab. Wife in agreement and expressed patient's need for rehab before coming home. CSW provided with 2 facility preferences. [name] reminded that insurance authorization will be needed before patient can d/c to facility and she expressed understanding.  Employment status:  Retired Nurse, adult, [place] ([place] and [employer] [place] Other) PT Recommendations:  [place] / Referral to community resources:  [place] ([place] talked with wife by phone and she provided CSW with 2 facility preferences)  Patient/Family's Response to care:  No concerns expressed by wife regarding care during  hospitalization.  Patient/Family's Understanding of and Emotional Response to Diagnosis, Current Treatment, and Prognosis:  Not discussed.  Emotional Assessment Appearance:  Appears stated age Attitude/Demeanor/Rapport:  Unable to Assess (Patient asleep when visited room) Affect (typically observed):  Unable to Assess Orientation:  Oriented to Self, Oriented to Place, Oriented to  Time, Oriented to Situation Alcohol / Substance use:  Alcohol Use, Illicit Drugs, Tobacco Use (Patient reports that he does not smoke, drink or use illicit drugs) Psych involvement (Current and /or in the community):  No (Comment)  Discharge Needs  Concerns to be addressed:  Discharge Planning Concerns Readmission within the last 30 days:  No Current discharge risk:  None Barriers to Discharge:  No Barriers Identified   Sable Feil, LCSW 02/06/2017, 11:28 AM

## 2017-02-06 NOTE — Progress Notes (Signed)
OT Cancellation    02/06/17 0900  OT Visit Information  Last OT Received On 02/06/17  Reason Eval/Treat Not Completed Patient at procedure or test/ unavailable (HD. Will check later today.)  History of Present Illness 76 y.o. male with medical history significant of ESRD on TTS HD, DM2, hx heart transplant, poorly controlled HTN, HLD who presented to the ED with comlaints of 2 weeks of generalized weakness and worsening confusion. Patient reportedly missed scheduled HD session on 3/15.   [employer], OTR/L [phone]

## 2017-02-06 NOTE — Progress Notes (Signed)
Assisted patient back to bed.  Patient able to tolerate 3 hours sitting in recliner.  Jillyn Ledger, MBA, BSN, RN

## 2017-02-06 NOTE — Progress Notes (Signed)
[facility] to request non-emergency transport to [facility].  Jillyn Ledger, MBA, BSN, RN

## 2017-02-06 NOTE — Progress Notes (Signed)
Pt prepared for d/c to SNF. IV d/c'd. Skin intact except as charted in most recent assessments. Vitals are stable. Report called to [facility] (receiving facility). Pt to be transported by ambulance service.  Jillyn Ledger, MBA, BSN, RN

## 2017-03-02 NOTE — ED Notes (Signed)
PTAR encoded with patient from [facility] that had been discharged and they were taking patient home.  Pt's BP per PTAR was 264/116 and he complained of a 7/10 headache so they were bringing him to the ED.  Prior to patient arrival to ED, wife called NS and states that pt's BP is always elevated and that patient needs to be brought home and not to ED.  Wife insisting that patient not be brought to hospital.  Another PTAR unit in ED made contact with unit transporting pt and they advised that pt wanted to be seen.  On arrival to ED, wife remains on phone and states that pt has altered mental status and is unable to make decision and that he needs to be brought home.  Charge RN spoke with pt and pt is alert and oriented x 4.  Pt states that if his wife said for him to come home that he wants to go home.  Dr. Thomasene Lot notified of patient and situation and states that if patient is alert and oriented he can make the decision if he wants to be seen or not.  PTAR explained to patient that his BP was elevated and that it was recommended that he stay and be seen.  Pt states that he wants to go home to his wife and doesn't want to check in.  Wife on phone states that pt's blood pressure is always elevated and he has had chronic headaches since having a craniotomy.  States she was sent home with pt's medication earlier today and he has not had his BP medication that is past due.  Verified with patient again and he states he does not want to be seen.  PTAR took patient home as initially planned.

## 2017-04-21 ENCOUNTER — Inpatient Hospital Stay (HOSPITAL_COMMUNITY)
Admission: EM | Admit: 2017-04-21 | Discharge: 2017-04-26 | DRG: 871 | Disposition: A | Discharge status: Skilled Nursing Facility | Payer: Medicare HMO | Attending: Family Medicine | Admitting: Family Medicine

## 2017-04-21 ENCOUNTER — Encounter (HOSPITAL_COMMUNITY): Payer: Self-pay

## 2017-04-21 DIAGNOSIS — F329 Major depressive disorder, single episode, unspecified: Secondary | ICD-10-CM | POA: Diagnosis present

## 2017-04-21 DIAGNOSIS — E440 Moderate protein-calorie malnutrition: Secondary | ICD-10-CM | POA: Insufficient documentation

## 2017-04-21 DIAGNOSIS — B955 Unspecified streptococcus as the cause of diseases classified elsewhere: Secondary | ICD-10-CM | POA: Diagnosis present

## 2017-04-21 DIAGNOSIS — E118 Type 2 diabetes mellitus with unspecified complications: Secondary | ICD-10-CM | POA: Diagnosis present

## 2017-04-21 DIAGNOSIS — D638 Anemia in other chronic diseases classified elsewhere: Secondary | ICD-10-CM | POA: Diagnosis present

## 2017-04-21 DIAGNOSIS — R21 Rash and other nonspecific skin eruption: Secondary | ICD-10-CM | POA: Diagnosis present

## 2017-04-21 DIAGNOSIS — Z941 Heart transplant status: Secondary | ICD-10-CM

## 2017-04-21 DIAGNOSIS — Z992 Dependence on renal dialysis: Secondary | ICD-10-CM

## 2017-04-21 DIAGNOSIS — E1165 Type 2 diabetes mellitus with hyperglycemia: Secondary | ICD-10-CM | POA: Diagnosis present

## 2017-04-21 DIAGNOSIS — E1122 Type 2 diabetes mellitus with diabetic chronic kidney disease: Secondary | ICD-10-CM | POA: Diagnosis present

## 2017-04-21 DIAGNOSIS — I10 Essential (primary) hypertension: Secondary | ICD-10-CM | POA: Diagnosis present

## 2017-04-21 DIAGNOSIS — R7881 Bacteremia: Principal | ICD-10-CM

## 2017-04-21 DIAGNOSIS — K2270 Barrett's esophagus without dysplasia: Secondary | ICD-10-CM | POA: Diagnosis present

## 2017-04-21 DIAGNOSIS — E039 Hypothyroidism, unspecified: Secondary | ICD-10-CM | POA: Diagnosis present

## 2017-04-21 DIAGNOSIS — F32A Depression, unspecified: Secondary | ICD-10-CM | POA: Diagnosis present

## 2017-04-21 DIAGNOSIS — E1136 Type 2 diabetes mellitus with diabetic cataract: Secondary | ICD-10-CM | POA: Diagnosis present

## 2017-04-21 DIAGNOSIS — N186 End stage renal disease: Secondary | ICD-10-CM

## 2017-04-21 DIAGNOSIS — F0390 Unspecified dementia without behavioral disturbance: Secondary | ICD-10-CM | POA: Diagnosis present

## 2017-04-21 NOTE — ED Triage Notes (Signed)
Pt just returned from [place] today.  Pt was seen at u/c yesterday for skin rash on arms.  Pt was started on antibiotics, then during night pt had episode of not being able to communicate.  Family was called today to advise that blood cultures were positive for strep and was told to go to ED for IV antibiotics.

## 2017-04-21 NOTE — ED Provider Notes (Signed)
[facility] DEPT Provider Note   CSN: [id number] Arrival date & time: 04/21/17  2253 By signing my name below, I, Dyke Brackett, attest that this documentation has been prepared under the direction and in the presence of Jola Schmidt, MD . Electronically Signed: Dyke Brackett, Scribe. 04/22/2017. 1:30 AM.   History   Chief Complaint Chief Complaint  Patient presents with  . Blood Infection   HPI [name] [name] is a 76 y.o. male with a history of heart transplant,  CAD, MI, CHF, dementia, and ESRD on dialysis who presents to the Emergency Department complaining of positive blood infection cultures performed yesterday. Pt was seen yesterday at an Urgent Care in [place], [place] for a skin tear to left arm sustained earlier that day. He was then started on Augmentin and topical Mupirocin. Per pt's wife, 1.5 hours after taking his medication he had a spell when he became unresponsive and staring off, and then was taken to [facility] ED. He did not urinate on himself during this episode. No rhythmic jerking. They wanted to admit and transfer hm to a larger facility in [place], but wife wanted to bring pt back to [place] to be evaluated. She received a call today from the ED in [place] stating that his blood cultures were positive for strep and was instructed to come to the ED for IV abx. Per wife, pt experienced some confusion earlier today, but this then resolved. Pt has no other acute complaints or associated symptoms at this time.    The history is provided by the patient. No language interpreter was used.   Past Medical History:  Diagnosis Date  . Anemia   . Cancer ([place])    SKIN  . CKD III-IV    a. since transplant in 2001 with significant worsening since 06/2013.  Marland Kitchen Coronary artery disease   . Dementia    mild  . Diabetes mellitus without complication (HCC)    FASTING CBG 90S  . Headache(784.0)   . Heart transplanted ([place])    a. 1995 CABG x 4 in [place], [place];  b. 2001 Developed CHF;   c. 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 10/2000 Cardiac transplant @ [facility], [place];  e. Historically nl Bx and stress testing.  Last stress test ~ 5 yrs ago.  Marland Kitchen Hernia of abdominal wall   . History of blood transfusion   . History of stroke   . Hypercalcemia    a. 06/2013 Hospitalized in [place].  Mar[place] Kitchen Hyperlipidemia   . Hypertension    a. Long h/o HTN, came off of all meds following transplant/wt loss-->meds resumed 06/2013, difficult to control since.  . Myocardial infarction ([place])    PRIOR TO HEART TRANSPLANT  . Orthostatic dizziness   . Orthostatic hypotension    a. Has tried support hose and compression stockings - prefers not to wear.  . Shortness of breath    EXERTION  . Stroke ([place]) 06/2013   NO RESIDUAL - WAS CONFUSED AT TIME - THAT HAS RESOLVED    Patient Active Problem List   Diagnosis Date Noted  . Acute metabolic encephalopathy 48/54/6270  . Volume overload 02/03/2017  . Generalized weakness 08/22/2016  . Urinary tract infection   . Syncope and collapse   . Syncope 08/07/2016  . Faintness   . Uncontrolled hypertension   . Acute blood loss anemia   . Cardiomyopathy ([place])   . Controlled diabetes mellitus type 2 with complications ([place])   . 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  Traumatic subdural hematoma ([place]) 05/04/2016  . 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 Subdural hematoma ([place]) 05/02/2016  . CKD (chronic kidney disease)   . Protein-calorie malnutrition, severe ([place]) 04/23/2015  . Anemia of chronic disease 04/23/2015  . Depression 04/23/2015  . UTI (urinary tract infection) 04/22/2015  . Barrett's esophagus 03/22/2015  . ESRD on dialysis ([place]) 10/12/2014  . DM (diabetes mellitus), type 2 with renal complications ([place]) 01/60/1093  . Hypothyroidism 10/06/2014  . Heart transplanted ([place])   . Hypercholesterolemia 06/20/2014  . 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 BASCILIC VEIN TRANSPOSITION Left 07/06/2014   Procedure: [place];  Surgeon: Rosetta Posner, MD;  Location: [facility];  Service: Vascular;  Laterality: Left;  . CARDIAC SURGERY    . CATARACT EXTRACTION    . CORONARY ARTERY BYPASS GRAFT    . CRANIOTOMY Right 05/04/2016   Procedure: Right Craniotomy for Evacuation of Subdural Hematoma;  Surgeon: Earnie Larsson, MD;  Location: [facility] NEURO ORS;  Service: Neurosurgery;  Laterality: Right;  . EYE SURGERY Bilateral    CATARACTS  . HEART TRANSPLANT    . PERIPHERAL VASCULAR CATHETERIZATION N/A 09/06/2016   Procedure: Fistulagram - left arm;  Surgeon: Waynetta Sandy, MD;  Location: [facility] CV LAB;  Service: Cardiovascular;  Laterality: N/A;  . PERIPHERAL VASCULAR CATHETERIZATION Left 09/06/2016   Procedure: Peripheral Vascular Intervention;  Surgeon: Waynetta Sandy, MD;  Location: [facility];  Service: Cardiovascular;  Laterality: Left;  UPPER ARM COVERED STENT  . SKIN GRAFT FULL THICKNESS LEG     cat scratch that did not heal  . VASECTOMY         Home Medications    Prior to Admission medications   Medication Sig Start Date End Date Taking? Authorizing Provider  acetaminophen (TYLENOL) 325 MG tablet Take 2 tablets (650 mg total) by mouth every 6 (six) hours as needed for mild pain (or Fever >/= 101). Patient taking differently: Take 650 mg by mouth every 6 (six) hours as needed for mild pain.  10/14/14   Delfina Redwood, MD  albuterol (PROVENTIL) (2.5 MG/3ML) 0.083% nebulizer solution Take 3 mLs (2.5 mg total) by nebulization every 6 (six) hours as needed for wheezing or shortness of breath. 11/09/16   Ward, Delice Bison, DO  amLODipine (NORVASC) 5 MG tablet Take 1 tablet (5 mg total) by mouth 2 (two) times daily. 02/06/17   Robbie Lis, MD  aspirin EC 81 MG tablet Take 81 mg by mouth at bedtime.    [provider]  calcium acetate (PHOSLO) 667 MG capsule Take 1  capsule (667 mg total) by mouth 3 (three) times daily with meals. Patient taking differently: Take 667 mg by mouth every morning.  08/24/16   Ghimire, Henreitta Leber, MD  cholecalciferol (VITAMIN D) 1000 UNITS tablet Take 1,000 Units by mouth daily.    [provider]  ciprofloxacin (CIPRO) 500 MG tablet Take 1 tablet (500 mg total) by mouth daily. 02/06/17   Robbie Lis, MD  cycloSPORINE modified (NEORAL) 25 MG capsule Take 50-75 mg by mouth 2 (two) times daily. 75 mg in the morning and 50 mg in the evening 08/18/14   Lelon Perla, MD  diphenoxylate-atropine (LOMOTIL) 2.5-0.025 MG tablet Take 1 tablet by mouth 4 (four) times daily as needed for diarrhea or loose stools. 08/24/16   Ghimire, Henreitta Leber, MD  donepezil (  ARICEPT) 10 MG tablet Take 10 mg by mouth at bedtime.  03/01/15   [provider]  FLUoxetine (PROZAC) 20 MG capsule Take 20 mg by mouth daily.     [provider]  fluticasone (FLONASE) 50 MCG/ACT nasal spray Place 2 sprays into both nostrils at bedtime as needed for allergies.  04/12/16   [provider]  lansoprazole (PREVACID) 30 MG capsule Take 30 mg by mouth See admin instructions. Take 1 capsule (30 mg) by mouth twice daily on Sunday, Monday, Wednesday, Friday (non-dialysis days) 12/22/15   [provider]  levothyroxine (SYNTHROID, LEVOTHROID) 75 MCG tablet Take 75 mcg by mouth daily.     [provider]  metoprolol tartrate (LOPRESSOR) 25 MG tablet Take 2 tablets (50 mg total) by mouth 2 (two) times daily. 12/27/14   Rai, Vernelle Emerald, MD  multivitamin (RENA-VIT) TABS tablet Take 1 tablet by mouth daily.  03/08/15   [provider]  mycophenolate (CELLCEPT) 250 MG capsule Take 250-500 mg by mouth daily. Tuesday Thursday and Saturday only take evening dose of 250 mg.  morning dose of 500 mg    [provider]  pravastatin (PRAVACHOL) 10 MG tablet Take 10 mg by mouth at bedtime.     [provider]  Probiotic  Product (PRO-BIOTIC BLEND) CAPS Take 1 capsule by mouth daily.    [provider]  vitamin B-12 (CYANOCOBALAMIN) 1000 MCG tablet Take 1,000 mcg by mouth daily with breakfast.    [provider]    Family History Family History  Problem Relation Age of Onset  . Heart attack Father        died @ 30  . Diabetes Father   . Heart attack Mother   . Diabetes Mother   . Kidney disease Maternal Aunt     Social History Social History  Substance Use Topics  . Smoking status: Never Smoker  . Smokeless tobacco: Never Used  . Alcohol use No     Allergies   Patient has no known allergies.   Review of Systems Review of Systems All systems reviewed and are negative for acute change except as noted in the HPI.  Physical Exam Updated Vital Signs BP (!) 138/117   Pulse 73   Temp 98.6 F (37 C) (Oral)   Resp 20   SpO2 96%   Physical Exam  Constitutional: He is oriented to person, place, and time. He appears well-developed and well-nourished.  HENT:  Head: Normocephalic and atraumatic.  Eyes: EOM are normal.  Neck: Normal range of motion.  Cardiovascular: Normal rate, regular rhythm, normal heart sounds and intact distal pulses.   Pulmonary/Chest: Effort normal and breath sounds normal. No respiratory distress.  Abdominal: Soft. He exhibits no distension. There is no tenderness.  Musculoskeletal: Normal range of motion.  Fistula LUE  Neurological: He is alert and oriented to person, place, and time.  Skin: Skin is warm and dry.  Raised skin lesion right posterior forearm without surrounding erythema. Skin tear left proximal forearm without surrounding erythema.   Psychiatric: He has a normal mood and affect. Judgment normal.  Nursing note and vitals reviewed.   ED Treatments / Results  DIAGNOSTIC STUDIES:  Oxygen Saturation is 97% on RA, normal by my interpretation.    COORDINATION OF CARE:  1:26 AM Discussed treatment plan with pt and wife at bedside and  who agreed to plan.  Labs (all labs ordered are listed, but only abnormal results are displayed) Labs Reviewed  CBC WITH  DIFFERENTIAL/PLATELET - Abnormal; Notable for the following:       Result Value   WBC 18.2 (*)    RBC 3.58 (*)    Hemoglobin 11.0 (*)    HCT 34.6 (*)    Neutro Abs 16.3 (*)    All other components within normal limits  COMPREHENSIVE METABOLIC PANEL - Abnormal; Notable for the following:    Sodium 134 (*)    Chloride 88 (*)    Glucose, Bld 189 (*)    BUN 57 (*)    Creatinine, Ser 6.25 (*)    Calcium 8.6 (*)    Total Protein 6.3 (*)    Albumin 3.4 (*)    GFR calc non Af Amer 8 (*)    GFR calc Af Amer 9 (*)    Anion gap 16 (*)    All other components within normal limits  CULTURE, BLOOD (ROUTINE X 2)  CULTURE, BLOOD (ROUTINE X 2)  LACTIC ACID, PLASMA    EKG  EKG Interpretation None       Radiology Dg Chest 2 View  Result Date: 04/22/2017 CLINICAL DATA:  Rash, positive blood cultures EXAM: CHEST  2 VIEW COMPARISON:  02/04/2017, 11/09/2016 FINDINGS: Post sternotomy changes. Ill-defined opacities in the peripheral right upper lobe, right base and left mid lung. No pleural effusion. Mild cardiomegaly with aortic atherosclerosis. No pneumothorax. Vascular stent in the left axilla. IMPRESSION: 1. Ill-defined multifocal opacity in the right upper lobe, right base and left upper lobe suspicious for infiltrates. 2. Cardiomegaly without edema. Electronically Signed   By: Donavan Foil M.D.   On: 04/22/2017 02:07    Procedures Procedures (including critical care time)  Medications Ordered in ED Medications - No data to display   Initial Impression / Assessment and Plan / ED Course  I have reviewed the triage vital signs and the nursing notes.  Pertinent labs & imaging results that were available during my care of the patient were reviewed by me and considered in my medical decision making (see chart for details).     Broad-spectrum antibiotics given for  possible bacteremia.  We will obtain lab and microbiology reports from outside hospital including ER records.  Staring episode concerning for possible seizure.  Head CT reported to me as normal from outside hospital yesterday.  Patient with possible infiltrates on chest x-ray.  Broad-spectrum and bikes given will cover this as well given that this would represent healthcare associated pneumonia.  Repeat blood cultures obtained.  Patient be admitted to the hospital.  Overall well-appearing  Final Clinical Impressions(s) / ED Diagnoses   Final diagnoses:  Bacteremia  Heart transplant status ([place])  ESRD (end stage renal disease) on dialysis [facility])    New Prescriptions New Prescriptions   No medications on file   I personally performed the services described in this documentation, which was scribed in my presence. The recorded information has been reviewed and is accurate.        Jola Schmidt, MD 04/22/17 [phone]

## 2017-04-22 ENCOUNTER — Encounter (HOSPITAL_COMMUNITY): Payer: Self-pay | Admitting: General Practice

## 2017-04-22 ENCOUNTER — Emergency Department (HOSPITAL_COMMUNITY): Payer: Medicare HMO

## 2017-04-22 DIAGNOSIS — F329 Major depressive disorder, single episode, unspecified: Secondary | ICD-10-CM | POA: Diagnosis present

## 2017-04-22 DIAGNOSIS — D638 Anemia in other chronic diseases classified elsewhere: Secondary | ICD-10-CM | POA: Diagnosis present

## 2017-04-22 DIAGNOSIS — Z8781 Personal history of (healed) traumatic fracture: Secondary | ICD-10-CM

## 2017-04-22 DIAGNOSIS — Z87440 Personal history of urinary (tract) infections: Secondary | ICD-10-CM | POA: Diagnosis not present

## 2017-04-22 DIAGNOSIS — S51811A Laceration without foreign body of right forearm, initial encounter: Secondary | ICD-10-CM

## 2017-04-22 DIAGNOSIS — F0390 Unspecified dementia without behavioral disturbance: Secondary | ICD-10-CM | POA: Diagnosis present

## 2017-04-22 DIAGNOSIS — I360 Nonrheumatic tricuspid (valve) stenosis: Secondary | ICD-10-CM | POA: Diagnosis not present

## 2017-04-22 DIAGNOSIS — Z992 Dependence on renal dialysis: Secondary | ICD-10-CM

## 2017-04-22 DIAGNOSIS — Z941 Heart transplant status: Secondary | ICD-10-CM

## 2017-04-22 DIAGNOSIS — R7881 Bacteremia: Principal | ICD-10-CM

## 2017-04-22 DIAGNOSIS — Z888 Allergy status to other drugs, medicaments and biological substances status: Secondary | ICD-10-CM

## 2017-04-22 DIAGNOSIS — Y95 Nosocomial condition: Secondary | ICD-10-CM | POA: Diagnosis not present

## 2017-04-22 DIAGNOSIS — E118 Type 2 diabetes mellitus with unspecified complications: Secondary | ICD-10-CM

## 2017-04-22 DIAGNOSIS — L989 Disorder of the skin and subcutaneous tissue, unspecified: Secondary | ICD-10-CM

## 2017-04-22 DIAGNOSIS — Z8673 Personal history of transient ischemic attack (TIA), and cerebral infarction without residual deficits: Secondary | ICD-10-CM | POA: Diagnosis not present

## 2017-04-22 DIAGNOSIS — K2270 Barrett's esophagus without dysplasia: Secondary | ICD-10-CM | POA: Diagnosis present

## 2017-04-22 DIAGNOSIS — N186 End stage renal disease: Secondary | ICD-10-CM

## 2017-04-22 DIAGNOSIS — E038 Other specified hypothyroidism: Secondary | ICD-10-CM

## 2017-04-22 DIAGNOSIS — S51812A Laceration without foreign body of left forearm, initial encounter: Secondary | ICD-10-CM

## 2017-04-22 DIAGNOSIS — E1122 Type 2 diabetes mellitus with diabetic chronic kidney disease: Secondary | ICD-10-CM | POA: Diagnosis present

## 2017-04-22 DIAGNOSIS — Z841 Family history of disorders of kidney and ureter: Secondary | ICD-10-CM

## 2017-04-22 DIAGNOSIS — Z79899 Other long term (current) drug therapy: Secondary | ICD-10-CM

## 2017-04-22 DIAGNOSIS — X58XXXA Exposure to other specified factors, initial encounter: Secondary | ICD-10-CM

## 2017-04-22 DIAGNOSIS — E1136 Type 2 diabetes mellitus with diabetic cataract: Secondary | ICD-10-CM | POA: Diagnosis present

## 2017-04-22 DIAGNOSIS — B955 Unspecified streptococcus as the cause of diseases classified elsewhere: Secondary | ICD-10-CM | POA: Diagnosis present

## 2017-04-22 DIAGNOSIS — Z8679 Personal history of other diseases of the circulatory system: Secondary | ICD-10-CM

## 2017-04-22 DIAGNOSIS — Z974 Presence of external hearing-aid: Secondary | ICD-10-CM

## 2017-04-22 DIAGNOSIS — E440 Moderate protein-calorie malnutrition: Secondary | ICD-10-CM | POA: Diagnosis not present

## 2017-04-22 DIAGNOSIS — I10 Essential (primary) hypertension: Secondary | ICD-10-CM

## 2017-04-22 DIAGNOSIS — Z833 Family history of diabetes mellitus: Secondary | ICD-10-CM

## 2017-04-22 DIAGNOSIS — E039 Hypothyroidism, unspecified: Secondary | ICD-10-CM | POA: Diagnosis present

## 2017-04-22 DIAGNOSIS — J189 Pneumonia, unspecified organism: Secondary | ICD-10-CM

## 2017-04-22 DIAGNOSIS — R21 Rash and other nonspecific skin eruption: Secondary | ICD-10-CM | POA: Diagnosis present

## 2017-04-22 DIAGNOSIS — Z8249 Family history of ischemic heart disease and other diseases of the circulatory system: Secondary | ICD-10-CM

## 2017-04-22 DIAGNOSIS — E1165 Type 2 diabetes mellitus with hyperglycemia: Secondary | ICD-10-CM | POA: Diagnosis present

## 2017-04-22 DIAGNOSIS — D72829 Elevated white blood cell count, unspecified: Secondary | ICD-10-CM | POA: Diagnosis not present

## 2017-04-22 DIAGNOSIS — Z9181 History of falling: Secondary | ICD-10-CM

## 2017-04-22 LAB — CBC WITH DIFFERENTIAL/PLATELET
Basophils Absolute: 0 10*3/uL (ref 0.0–0.1)
Basophils Relative: 0 %
EOS PCT: 0 %
Eosinophils Absolute: 0 10*3/uL (ref 0.0–0.7)
HEMATOCRIT: 34.6 % — AB (ref 39.0–52.0)
Hemoglobin: 11 g/dL — ABNORMAL LOW (ref 13.0–17.0)
LYMPHS ABS: 1.1 10*3/uL (ref 0.7–4.0)
LYMPHS PCT: 6 %
MCH: 30.7 pg (ref 26.0–34.0)
MCHC: 31.8 g/dL (ref 30.0–36.0)
MCV: 96.6 fL (ref 78.0–100.0)
MONO ABS: 0.7 10*3/uL (ref 0.1–1.0)
Monocytes Relative: 4 %
NEUTROS ABS: 16.3 10*3/uL — AB (ref 1.7–7.7)
Neutrophils Relative %: 90 %
Platelets: 203 10*3/uL (ref 150–400)
RBC: 3.58 MIL/uL — AB (ref 4.22–5.81)
RDW: 15.2 % (ref 11.5–15.5)
WBC: 18.2 10*3/uL — ABNORMAL HIGH (ref 4.0–10.5)

## 2017-04-22 LAB — CBC AND DIFFERENTIAL
HCT: 35 — AB (ref 41–53)
HEMOGLOBIN: 11 — AB (ref 13.5–17.5)
PLATELETS: 203 (ref 150–399)
WBC: 18.2

## 2017-04-22 LAB — GLUCOSE, CAPILLARY
GLUCOSE-CAPILLARY: 147 mg/dL — AB (ref 65–99)
Glucose-Capillary: 211 mg/dL — ABNORMAL HIGH (ref 65–99)
Glucose-Capillary: 110 mg/dL — ABNORMAL HIGH (ref 65–99)

## 2017-04-22 LAB — COMPREHENSIVE METABOLIC PANEL
ALT: 21 U/L (ref 17–63)
AST: 23 U/L (ref 15–41)
Albumin: 3.4 g/dL — ABNORMAL LOW (ref 3.5–5.0)
Alkaline Phosphatase: 115 U/L (ref 38–126)
Anion gap: 16 — ABNORMAL HIGH (ref 5–15)
BILIRUBIN TOTAL: 0.5 mg/dL (ref 0.3–1.2)
BUN: 57 mg/dL — AB (ref 6–20)
CHLORIDE: 88 mmol/L — AB (ref 101–111)
CO2: 30 mmol/L (ref 22–32)
CREATININE: 6.25 mg/dL — AB (ref 0.61–1.24)
Calcium: 8.6 mg/dL — ABNORMAL LOW (ref 8.9–10.3)
GFR, EST AFRICAN AMERICAN: 9 mL/min — AB (ref >60)
GFR, EST NON AFRICAN AMERICAN: 8 mL/min — AB (ref >60)
Glucose, Bld: 189 mg/dL — ABNORMAL HIGH (ref 65–99)
Potassium: 5.1 mmol/L (ref 3.5–5.1)
Sodium: 134 mmol/L — ABNORMAL LOW (ref 135–145)
TOTAL PROTEIN: 6.3 g/dL — AB (ref 6.5–8.1)

## 2017-04-22 LAB — HEPATIC FUNCTION PANEL: Bilirubin, Total: 0.5

## 2017-04-22 LAB — BASIC METABOLIC PANEL
BUN: 57 — AB (ref 4–21)
Creatinine: 6.3 — AB (ref 0.6–1.3)
GLUCOSE: 189
Potassium: 5.1 (ref 3.4–5.3)
Sodium: 134 — AB (ref 137–147)

## 2017-04-22 LAB — LACTIC ACID, PLASMA: LACTIC ACID, VENOUS: 1.8 mmol/L (ref 0.5–1.9)

## 2017-04-22 LAB — MRSA PCR SCREENING: MRSA BY PCR: POSITIVE — AB

## 2017-04-22 MED ORDER — MYCOPHENOLATE MOFETIL 250 MG PO CAPS
250.0000 mg | ORAL_CAPSULE | ORAL | Status: DC
  Administered 2017-04-22 – 2017-04-25 (×2): 250 mg via ORAL
  Filled 2017-04-22 (×2): qty 1

## 2017-04-22 MED ORDER — VITAMIN D 1000 UNITS PO TABS
1000.0000 [IU] | ORAL_TABLET | Freq: Every day | ORAL | Status: DC
  Administered 2017-04-22 – 2017-04-26 (×5): 1000 [IU] via ORAL
  Filled 2017-04-22 (×5): qty 1

## 2017-04-22 MED ORDER — METOPROLOL TARTRATE 50 MG PO TABS
50.0000 mg | ORAL_TABLET | Freq: Two times a day (BID) | ORAL | Status: DC
  Administered 2017-04-22 – 2017-04-26 (×7): 50 mg via ORAL
  Filled 2017-04-22 (×9): qty 1

## 2017-04-22 MED ORDER — VANCOMYCIN HCL IN DEXTROSE 750-5 MG/150ML-% IV SOLN
750.0000 mg | INTRAVENOUS | Status: DC
  Filled 2017-04-22: qty 150

## 2017-04-22 MED ORDER — PIPERACILLIN-TAZOBACTAM 3.375 G IVPB
3.3750 g | Freq: Two times a day (BID) | INTRAVENOUS | Status: DC
  Administered 2017-04-22 – 2017-04-24 (×4): 3.375 g via INTRAVENOUS
  Filled 2017-04-22 (×5): qty 50

## 2017-04-22 MED ORDER — CYCLOSPORINE MODIFIED (NEORAL) 25 MG PO CAPS
75.0000 mg | ORAL_CAPSULE | Freq: Every day | ORAL | Status: DC
  Administered 2017-04-23 – 2017-04-26 (×4): 75 mg via ORAL
  Filled 2017-04-22 (×6): qty 3

## 2017-04-22 MED ORDER — ACETAMINOPHEN 325 MG PO TABS
650.0000 mg | ORAL_TABLET | Freq: Four times a day (QID) | ORAL | Status: DC | PRN
  Administered 2017-04-25 – 2017-04-26 (×2): 650 mg via ORAL
  Filled 2017-04-22 (×3): qty 2

## 2017-04-22 MED ORDER — MYCOPHENOLATE MOFETIL 250 MG PO CAPS: 500.0000 mg | ORAL_CAPSULE | ORAL | Status: DC

## 2017-04-22 MED ORDER — AMLODIPINE BESYLATE 5 MG PO TABS
5.0000 mg | ORAL_TABLET | Freq: Two times a day (BID) | ORAL | Status: DC
  Filled 2017-04-22: qty 1

## 2017-04-22 MED ORDER — PRAVASTATIN SODIUM 20 MG PO TABS
10.0000 mg | ORAL_TABLET | Freq: Every day | ORAL | Status: DC
  Administered 2017-04-22 – 2017-04-25 (×2): 10 mg via ORAL
  Filled 2017-04-22 (×3): qty 1

## 2017-04-22 MED ORDER — VANCOMYCIN HCL 10 G IV SOLR
1500.0000 mg | Freq: Once | INTRAVENOUS | Status: AC
  Administered 2017-04-22: 1500 mg via INTRAVENOUS
  Filled 2017-04-22: qty 1500

## 2017-04-22 MED ORDER — RISAQUAD PO CAPS
1.0000 | ORAL_CAPSULE | Freq: Every day | ORAL | Status: DC
  Administered 2017-04-23 – 2017-04-26 (×4): 1 via ORAL
  Filled 2017-04-22 (×5): qty 1

## 2017-04-22 MED ORDER — CYCLOSPORINE MODIFIED (NEORAL) 25 MG PO CAPS: 50.0000 mg | ORAL_CAPSULE | ORAL | Status: DC

## 2017-04-22 MED ORDER — PRO-BIOTIC BLEND PO CAPS: 1.0000 | ORAL_CAPSULE | Freq: Every day | ORAL | Status: DC

## 2017-04-22 MED ORDER — MYCOPHENOLATE MOFETIL 250 MG PO CAPS: 250.0000 mg | ORAL_CAPSULE | Freq: Every day | ORAL | Status: DC

## 2017-04-22 MED ORDER — CYCLOSPORINE MODIFIED (NEORAL) 25 MG PO CAPS
50.0000 mg | ORAL_CAPSULE | Freq: Every day | ORAL | Status: DC
  Administered 2017-04-22 – 2017-04-25 (×2): 50 mg via ORAL
  Filled 2017-04-22 (×5): qty 2

## 2017-04-22 MED ORDER — CHLORHEXIDINE GLUCONATE CLOTH 2 % EX PADS
6.0000 | MEDICATED_PAD | Freq: Every day | CUTANEOUS | Status: DC
  Administered 2017-04-23 – 2017-04-26 (×3): 6 via TOPICAL

## 2017-04-22 MED ORDER — RENA-VITE PO TABS
1.0000 | ORAL_TABLET | Freq: Every day | ORAL | Status: DC
  Administered 2017-04-22 – 2017-04-25 (×2): 1 via ORAL
  Filled 2017-04-22 (×3): qty 1

## 2017-04-22 MED ORDER — LEVOTHYROXINE SODIUM 75 MCG PO TABS
75.0000 ug | ORAL_TABLET | Freq: Every day | ORAL | Status: DC
  Administered 2017-04-22 – 2017-04-26 (×5): 75 ug via ORAL
  Filled 2017-04-22 (×5): qty 1

## 2017-04-22 MED ORDER — FLUTICASONE PROPIONATE 50 MCG/ACT NA SUSP: 2.0000 | Freq: Every evening | NASAL | Status: DC | PRN

## 2017-04-22 MED ORDER — ONDANSETRON HCL 4 MG PO TABS
8.0000 mg | ORAL_TABLET | Freq: Three times a day (TID) | ORAL | Status: DC | PRN
  Administered 2017-04-22: 8 mg via ORAL
  Filled 2017-04-22 (×3): qty 2

## 2017-04-22 MED ORDER — MUPIROCIN 2 % EX OINT
1.0000 "application " | TOPICAL_OINTMENT | Freq: Two times a day (BID) | CUTANEOUS | Status: DC
  Administered 2017-04-22 – 2017-04-26 (×8): 1 via NASAL
  Filled 2017-04-22 (×2): qty 22

## 2017-04-22 MED ORDER — CALCIUM ACETATE (PHOS BINDER) 667 MG PO CAPS
667.0000 mg | ORAL_CAPSULE | Freq: Three times a day (TID) | ORAL | Status: DC
  Administered 2017-04-22 – 2017-04-26 (×9): 667 mg via ORAL
  Filled 2017-04-22 (×10): qty 1

## 2017-04-22 MED ORDER — PANTOPRAZOLE SODIUM 40 MG PO TBEC
40.0000 mg | DELAYED_RELEASE_TABLET | Freq: Every day | ORAL | Status: DC
  Administered 2017-04-23 – 2017-04-26 (×4): 40 mg via ORAL
  Filled 2017-04-22 (×5): qty 1

## 2017-04-22 MED ORDER — MYCOPHENOLATE MOFETIL 250 MG PO CAPS
500.0000 mg | ORAL_CAPSULE | Freq: Every day | ORAL | Status: DC
  Administered 2017-04-23 – 2017-04-26 (×4): 500 mg via ORAL
  Filled 2017-04-22 (×5): qty 2

## 2017-04-22 MED ORDER — HYDRALAZINE HCL 25 MG PO TABS
25.0000 mg | ORAL_TABLET | Freq: Four times a day (QID) | ORAL | Status: DC
  Administered 2017-04-22 – 2017-04-26 (×9): 25 mg via ORAL
  Filled 2017-04-22 (×10): qty 1

## 2017-04-22 MED ORDER — DIPHENOXYLATE-ATROPINE 2.5-0.025 MG PO TABS: 1.0000 | ORAL_TABLET | Freq: Four times a day (QID) | ORAL | Status: DC | PRN

## 2017-04-22 MED ORDER — INSULIN ASPART 100 UNIT/ML ~~LOC~~ SOLN
0.0000 [IU] | Freq: Three times a day (TID) | SUBCUTANEOUS | Status: DC
  Administered 2017-04-22: 1 [IU] via SUBCUTANEOUS
  Administered 2017-04-22: 3 [IU] via SUBCUTANEOUS
  Administered 2017-04-23: 1 [IU] via SUBCUTANEOUS
  Administered 2017-04-24: 2 [IU] via SUBCUTANEOUS
  Administered 2017-04-25 – 2017-04-26 (×3): 3 [IU] via SUBCUTANEOUS

## 2017-04-22 MED ORDER — HYDRALAZINE HCL 20 MG/ML IJ SOLN: 10.0000 mg | INTRAMUSCULAR | Status: DC | PRN

## 2017-04-22 MED ORDER — VITAMIN B-12 1000 MCG PO TABS
1000.0000 ug | ORAL_TABLET | Freq: Every day | ORAL | Status: DC
  Administered 2017-04-22 – 2017-04-26 (×5): 1000 ug via ORAL
  Filled 2017-04-22 (×5): qty 1

## 2017-04-22 MED ORDER — ASPIRIN EC 81 MG PO TBEC
81.0000 mg | DELAYED_RELEASE_TABLET | Freq: Every day | ORAL | Status: DC
  Administered 2017-04-22 – 2017-04-25 (×2): 81 mg via ORAL
  Filled 2017-04-22 (×3): qty 1

## 2017-04-22 MED ORDER — ENOXAPARIN SODIUM 30 MG/0.3ML ~~LOC~~ SOLN
30.0000 mg | SUBCUTANEOUS | Status: DC
  Administered 2017-04-22 – 2017-04-23 (×2): 30 mg via SUBCUTANEOUS
  Filled 2017-04-22 (×2): qty 0.3

## 2017-04-22 MED ORDER — PIPERACILLIN-TAZOBACTAM 3.375 G IVPB 30 MIN
3.3750 g | Freq: Once | INTRAVENOUS | Status: AC
  Administered 2017-04-22: 3.375 g via INTRAVENOUS
  Filled 2017-04-22: qty 50

## 2017-04-22 MED ORDER — DONEPEZIL HCL 10 MG PO TABS
10.0000 mg | ORAL_TABLET | Freq: Every day | ORAL | Status: DC
  Administered 2017-04-22 – 2017-04-25 (×2): 10 mg via ORAL
  Filled 2017-04-22 (×3): qty 1

## 2017-04-22 MED ORDER — FLUOXETINE HCL 20 MG PO CAPS
20.0000 mg | ORAL_CAPSULE | Freq: Every day | ORAL | Status: DC
  Administered 2017-04-23 – 2017-04-26 (×4): 20 mg via ORAL
  Filled 2017-04-22 (×5): qty 1

## 2017-04-22 NOTE — H&P (Signed)
History and Physical    [name] [name] VOH:[id number] DOB: [birth year] DOA: 04/21/2017  PCP: Junie Panning, NP   Patient coming from: Home.  I have personally briefly reviewed patient's old medical records in [place]  Chief Complaint: Blood infection.  HPI: [name] [name] is a 76 y.o. male with medical history significant of anemia, ESRD on hemodialysis, coronary artery disease, dementia, type 2 diabetes, heart transplant, history of stroke, hyperlipidemia, hypertension who is coming to the emergency department with a history of being called from urgent care in [place], [place], for he was seen yesterday for skin rash on both arms and left forearm skin tear, given extensive work up, including blood cultures and was recommended to be admitted at [facility], [place] for further evaluation and treatment. However, they decided to come back to his oral and follow-up with her local providers. Family mentions that they were called earlier today and told the blood cultures have been positive for strep and were asked to come to the local emergency department.   ED Course: Patient was given vancomycin and Zosyn. Workup in the emergency department shows WBC of 18.2 (this is increased from about 12, and yesterday in tendency), hemoglobin 11 g/dL and platelets 203. Sodium was 134, potassium 5.1, chloride 88, lactic acid 1.8 and bicarbonate 30 mmol/L. BUN was 57, creatinine 6.25 and glucose 189 mg/dL.  Imaging: CT scan of the head, abdomen and pelvis done in [place]. did not show any acute findings. Please see full reports for further detail.  Review of Systems: As per HPI otherwise 10 point review of systems negative.    Past Medical History:  Diagnosis Date  . Anemia   . Cancer ([place])    SKIN  . CKD III-IV    a. since transplant in 2001 with significant worsening since 06/2013.  [place] Kitchen Coronary artery disease   . Dementia    mild  . Diabetes mellitus without complication (HCC)    FASTING CBG  90S  . Headache(784.0)   . Heart transplanted ([place])    a. 1995 CABG x 4 in [place], [place];  b. 2001 Developed CHF;  c. 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 10/2000 Cardiac transplant @ [facility], [place];  e. Historically nl Bx and stress testing.  Last stress test ~ 5 yrs ago.  Marland Kitchen Hernia of abdominal wall   . History of blood transfusion   . History of stroke   . Hypercalcemia    a. 06/2013 Hospitalized in [place].  [place] Kitchen Hyperlipidemia   . Hypertension    a. Long h/o HTN, came off of all meds following transplant/wt loss-->meds resumed 06/2013, difficult to control since.  . Myocardial infarction ([place])    PRIOR TO HEART TRANSPLANT  . Orthostatic dizziness   . Orthostatic hypotension    a. Has tried support hose and compression stockings - prefers not to wear.  . Shortness of breath    EXERTION  . Stroke ([place]) 06/2013   NO RESIDUAL - WAS CONFUSED AT TIME - THAT HAS RESOLVED    Past Surgical History:  Procedure Laterality Date  . BASCILIC VEIN TRANSPOSITION Left 07/06/2014   Procedure: [place];  Surgeon: Rosetta Posner, MD;  Location: [place];  Service: Vascular;  Laterality: Left;  . CARDIAC SURGERY    . CATARACT EXTRACTION    . CORONARY ARTERY BYPASS GRAFT    . CRANIOTOMY Right 05/04/2016   Procedure: Right Craniotomy for Evacuation of Subdural Hematoma;  Surgeon: Earnie Larsson, MD;  Location: [facility];  Service: Neurosurgery;  Laterality: Right;  . EYE SURGERY Bilateral    CATARACTS  . HEART TRANSPLANT    . PERIPHERAL VASCULAR CATHETERIZATION N/A 09/06/2016   Procedure: Fistulagram - left arm;  Surgeon: Waynetta Sandy, MD;  Location: [facility];  Service: Cardiovascular;  Laterality: N/A;  . PERIPHERAL VASCULAR CATHETERIZATION Left 09/06/2016   Procedure: Peripheral Vascular Intervention;  Surgeon: Waynetta Sandy, MD;  Location: [facility];  Service: Cardiovascular;  Laterality: Left;  UPPER ARM COVERED STENT  . SKIN GRAFT FULL THICKNESS  LEG     cat scratch that did not heal  . VASECTOMY       reports that he has never smoked. He has never used smokeless tobacco. He reports that he does not drink alcohol or use drugs.  Allergies  Allergen Reactions  . Norvasc [Amlodipine Besylate] Swelling and Other (See Comments)    Swelling of the lower body     Family History  Problem Relation Age of Onset  . Heart attack Father        died @ 63  . Diabetes Father   . Heart attack Mother   . Diabetes Mother   . Kidney disease Maternal Aunt     Prior to Admission medications   Medication Sig Start Date End Date Taking? Authorizing Provider  acetaminophen (TYLENOL) 325 MG tablet Take 2 tablets (650 mg total) by mouth every 6 (six) hours as needed for mild pain (or Fever >/= 101). Patient taking differently: Take 650 mg by mouth 2 (two) times daily.  10/14/14  Yes Delfina Redwood, MD  amoxicillin-clavulanate (AUGMENTIN) 500-125 MG tablet Take 1 tablet by mouth 3 (three) times daily. 04/20/17  Yes [provider]  aspirin EC 81 MG tablet Take 81 mg by mouth at bedtime.   Yes [provider]  calcium acetate (PHOSLO) 667 MG capsule Take 1 capsule (667 mg total) by mouth 3 (three) times daily with meals. 08/24/16  Yes Ghimire, Henreitta Leber, MD  cholecalciferol (VITAMIN D) 1000 UNITS tablet Take 1,000 Units by mouth daily.   Yes [provider]  cycloSPORINE modified (NEORAL) 25 MG capsule Take 50-75 mg by mouth See admin instructions. 75 mg in the morning and 50 mg in the evening 08/18/14  Yes Crenshaw, Denice Bors, MD  diphenoxylate-atropine (LOMOTIL) 2.5-0.025 MG tablet Take 1 tablet by mouth 4 (four) times daily as needed for diarrhea or loose stools. 08/24/16  Yes Ghimire, Henreitta Leber, MD  donepezil (ARICEPT) 10 MG tablet Take 10 mg by mouth at bedtime.  03/01/15  Yes [provider]  FLUoxetine (PROZAC) 20 MG capsule Take 20 mg by mouth daily.    Yes [provider]  fluticasone (FLONASE) 50  MCG/ACT nasal spray Place 2 sprays into both nostrils at bedtime as needed for allergies.  04/12/16  Yes [provider]  lansoprazole (PREVACID) 30 MG capsule Take 30 mg by mouth 2 (two) times daily before a meal.  12/22/15  Yes [provider]  levothyroxine (SYNTHROID, LEVOTHROID) 75 MCG tablet Take 75 mcg by mouth daily.    Yes [provider]  metoprolol tartrate (LOPRESSOR) 25 MG tablet Take 2 tablets (50 mg total) by mouth 2 (two) times daily. 12/27/14  Yes Rai, Ripudeep K, MD  multivitamin (RENA-VIT) TABS tablet Take 1 tablet by mouth daily.  03/08/15  Yes [provider]  mycophenolate (CELLCEPT) 250 MG capsule Take 250-500 mg by mouth daily. Take 500 mg every morning and  take 250 mg every evening on Sunday, Monday, Wednesday and Friday then take 500 mg every evening on Tuesday Thursday and Saturday   Yes [provider]  pravastatin (PRAVACHOL) 10 MG tablet Take 10 mg by mouth at bedtime.    Yes [provider]  Probiotic Product (PRO-BIOTIC BLEND) CAPS Take 1 capsule by mouth daily.   Yes [provider]  vitamin B-12 (CYANOCOBALAMIN) 1000 MCG tablet Take 1,000 mcg by mouth daily with breakfast.   Yes [provider]  albuterol (PROVENTIL) (2.5 MG/3ML) 0.083% nebulizer solution Take 3 mLs (2.5 mg total) by nebulization every 6 (six) hours as needed for wheezing or shortness of breath. Patient not taking: Reported on 04/22/2017 11/09/16   Ward, Delice Bison, DO  amLODipine (NORVASC) 5 MG tablet Take 1 tablet (5 mg total) by mouth 2 (two) times daily. Patient not taking: Reported on 04/22/2017 02/06/17   Robbie Lis, MD  ciprofloxacin (CIPRO) 500 MG tablet Take 1 tablet (500 mg total) by mouth daily. Patient not taking: Reported on 04/22/2017 02/06/17   Robbie Lis, MD    Physical Exam: Vitals:   04/22/17 0215 04/22/17 0230 04/22/17 0245 04/22/17 0300  BP: (!) 196/72 (!) 186/69 (!) 179/68 (!) 169/68  Pulse: 79 78 77 75  Resp:  18   18  Temp:      TempSrc:      SpO2: 99% 98% 98% 98%  Weight:      Height:       Constitutional: NAD, calm, comfortable Eyes: PERRL, lids and conjunctivae normal ENMT: Mucous membranes are moist. Posterior pharynx clear of any exudate or lesions. Neck: normal, supple, no masses, no thyromegaly.  Respiratory: Clear to auscultation bilaterally, no wheezing, no crackles. Normal respiratory effort. No accessory muscle use.  Cardiovascular: Regular rate and rhythm, no murmurs / rubs / gallops. No extremity edema. 2+ pedal pulses. No carotid bruits. Left upper extremity AV fistula with good thrill. Abdomen: Soft, no tenderness, no masses palpated. No hepatosplenomegaly. Bowel sounds positive.  Musculoskeletal: no clubbing / cyanosis. Good ROM, no contractures. Normal muscle tone.  Skin: Raised skin lesion on right forearm and skin tear on left forearm. Neurologic: CN 2-12 grossly intact. Sensation intact, DTR normal. Strength 5/5 in all 4.  Psychiatric: Alert and oriented x 2.   Labs on Admission: I have personally reviewed following labs and imaging studies  CBC:  Recent Labs Lab 04/22/17 0145  WBC 18.2*  NEUTROABS 16.3*  HGB 11.0*  HCT 34.6*  MCV 96.6  PLT 240   Basic Metabolic Panel:  Recent Labs Lab 04/22/17 0145  NA 134*  K 5.1  CL 88*  CO2 30  GLUCOSE 189*  BUN 57*  CREATININE 6.25*  CALCIUM 8.6*   GFR: Estimated Creatinine Clearance: 9.4 mL/min (A) (by C-G formula based on SCr of 6.25 mg/dL (H)). Liver Function Tests:  Recent Labs Lab 04/22/17 0145  AST 23  ALT 21  ALKPHOS 115  BILITOT 0.5  PROT 6.3*  ALBUMIN 3.4*   No results for input(s): LIPASE, AMYLASE in the last 168 hours. No results for input(s): AMMONIA in the last 168 hours. Coagulation Profile: No results for input(s): INR, PROTIME in the last 168 hours. Cardiac Enzymes: No results for input(s): CKTOTAL, CKMB, CKMBINDEX, TROPONINI in the last 168 hours. BNP (last 3 results) No results  for input(s): PROBNP in the last 8760 hours. HbA1C: No results for input(s): HGBA1C in the last 72 hours. CBG: No results for input(s): GLUCAP in the  last 168 hours. Lipid Profile: No results for input(s): CHOL, HDL, LDLCALC, TRIG, CHOLHDL, LDLDIRECT in the last 72 hours. Thyroid Function Tests: No results for input(s): TSH, T4TOTAL, FREET4, T3FREE, THYROIDAB in the last 72 hours. Anemia Panel: No results for input(s): VITAMINB12, FOLATE, FERRITIN, TIBC, IRON, RETICCTPCT in the last 72 hours. Urine analysis:    Component Value Date/Time   COLORURINE YELLOW 02/03/2017 1340   APPEARANCEUR CLOUDY (A) 02/03/2017 1340   LABSPEC 1.013 02/03/2017 1340   PHURINE 8.0 02/03/2017 1340   GLUCOSEU NEGATIVE 02/03/2017 1340   HGBUR MODERATE (A) 02/03/2017 1340   BILIRUBINUR NEGATIVE 02/03/2017 1340   KETONESUR NEGATIVE 02/03/2017 1340   PROTEINUR 100 (A) 02/03/2017 1340   UROBILINOGEN 1.0 04/22/2015 1952   NITRITE NEGATIVE 02/03/2017 1340   LEUKOCYTESUR LARGE (A) 02/03/2017 1340    Radiological Exams on Admission: Dg Chest 2 View  Result Date: 04/22/2017 CLINICAL DATA:  Rash, positive blood cultures EXAM: CHEST  2 VIEW COMPARISON:  02/04/2017, 11/09/2016 FINDINGS: Post sternotomy changes. Ill-defined opacities in the peripheral right upper lobe, right base and left mid lung. No pleural effusion. Mild cardiomegaly with aortic atherosclerosis. No pneumothorax. Vascular stent in the left axilla. IMPRESSION: 1. Ill-defined multifocal opacity in the right upper lobe, right base and left upper lobe suspicious for infiltrates. 2. Cardiomegaly without edema. Electronically Signed   By: Donavan Foil M.D.   On: 04/22/2017 02:07   Echocardiogram 04/25/2016 ------------------------------------------------------------------- LV EF: 60% -   65%  ------------------------------------------------------------------- Indications:      CVA  436.  ------------------------------------------------------------------- History:   Risk factors:  History of heart transplant. End stage renal disease. Hypertension. Diabetes mellitus.  ------------------------------------------------------------------- Study Conclusions  - Left ventricle: The cavity size was normal. Wall thickness was   increased in a pattern of mild LVH. Systolic function was normal.   The estimated ejection fraction was in the range of 60% to 65%.   Wall motion was normal; there were no regional wall motion   abnormalities. Left ventricular diastolic function parameters   were normal. - Left atrium: The atrium was moderately dilated. - Atrial septum: No defect or patent foramen ovale was identified  EKG: Independently reviewed. N/A  Assessment/Plan Principal Problem:   Bacteremia Telemetry/Inpatient. Continue vancomycin per pharmacy. Continue Zosyn per pharmacy. Blood cultures results drawn on Friday unavailable at this time. May obtain later today by calling medical records in [place] at [phone]. Fax number is [phone]. Follow-up blood cultures and sensitivity drawn today.  Active Problems:   ESRD on dialysis [facility]) Please consult nephrology in AM    Heart transplanted [facility]) Continue cyclosporine and Cellcept.    Controlled diabetes mellitus type 2 with complications (HCC) Carbohydrate modified diet. CBG monitoring with regular insulin sliding scale while in the hospital.    Hypothyroidism Continue levothyroxine 75 g by mouth daily.      Barrett's esophagus Pantoprazole 40 mg by mouth daily.    Anemia of chronic disease Monitor hematocrit and hemoglobin.    Depression Continue fluoxetine 20 mg by mouth daily.      Uncontrolled hypertension Continue metoprolol 50 mg by mouth twice a day. Blood pressure monitoring.    DVT prophylaxis:Lovenox SQ.  Code Status: Full code. Family Communication:  Disposition Plan: Admit for  IV antibiotics.  Consults called:  Admission status: Inpatient/Telemetry.   Reubin Milan MD [facility] Pager [phone].  If 7PM-7AM, please contact night-coverage [email or web address] Password [email or web address]  04/22/2017, 3:09 AM

## 2017-04-22 NOTE — ED Notes (Signed)
Patient transported to X-ray 

## 2017-04-22 NOTE — Progress Notes (Signed)
PROGRESS NOTE  [name] [name]  WFU:[id number] DOB: May 29, 1941 DOA: 04/21/2017 PCP: Junie Panning, NP   Brief Narrative: [name] [name] is a 76 y.o. male with a history of heart transplant, ESRD, CAD, T2DM, HTN, HLD, and dementia who presented to the ED due to report of positive blood cultures. While visiting [place], [place] last week, he had been evaluated at urgent care for a left arm wound on 6/1. He was prescribed augmentin, and topical mupirocin ointment and sent home. That night he became nonverbal, not able to follow commands, so he was taken to [facility] in [place], [place] in the very early morning hours of 6/2. Work up there included CT head, imaging of his abdomen, and blood work including cultures. Per his wife, from whom all history is obtained due to patient's confusion, they left the ED in the morning and drove home to [place]. He had a low grade fever but was acting at his baseline without cough, dyspnea, chest pain or other complaints, when they received a call from the ED reporting a positive blood culture. They were referred to the ED where work up showed WBC 18.2 and CXR demonstrated multiple infiltrates concerning for HCAP. Vancomycin and zosyn were administered and he was admitted early this morning.   Assessment & Plan: Principal Problem:   Bacteremia Active Problems:   Heart transplanted ([place])   Hypothyroidism   ESRD on dialysis ([place])   Barrett's esophagus   Anemia of chronic disease   Depression   Controlled diabetes mellitus type 2 with complications ([place])   Uncontrolled hypertension  Streptococcal bacteremia: Per family report, cultures drawn at [facility] ED were said to be positive for streptococcal bacteria within 12 hours. Wife not sure how many bottles. In patient on immunosuppressants for heart transplant. +Leukocytosis without other SIRS.  - Obtain medical records from medical records in [place] at [phone]. Care  Everywhere query unsuccessful.  - On broad spectrum abx to include coverage for HCAP (infiltrates on CXR, vanc/zosyn) - Follow up blood cultures drawn at admission here.  - Echocardiogram ordered - Appreciate ID assistance.  ESRD: TTS schedule. Schedule thrown off by recent vacation: Last got HD 6/1, planned to return 6/4, then back to TTS.  - Will discuss with nephrology four routine HD. No urgent needs. - Continue phoslo  History of heart transplant 2001: [facility], [place], [place].  - Continue cyclosporine and cellcept.  Controlled T2DM: Diet-controlled, HbA1c 5.5% in 2017. - SSI.   Hypertension with severe range HTN:  - Continue metoprolol 50mg  BID.  - Titrate medications as necessary following HD  Forearm wounds:  - WOC consulted  Upper lip wound: With eschar. No surrounding erythema.  - Consider biopsy if not healing.   Hypothyroidism: Last TSH 1.4 - Cotn[place]ue levothyroxine 75 g daily    Barrett's esophagus - Continue PO PPI  Anemia of chronic disease: Monitor hematocrit and hemoglobin.  Depression and dementia: - Continue fluoxetine and aricept - Wife reports about 1 year of increasing dependence on her for ADL's, general mental decline.     DVT prophylaxis: Lovenox Code Status: Full Family Communication: Wife by phone Disposition Plan: Uncertain at this time. PT consulted. Has required SNF [facility]) at recent discharge for UTI. Wife would prefer [facility] if this is deemed appropriate as it is closer to her home.  Consultants:   None  Procedures:  Echocardiogram 04/25/2016 ------------------------------------------------------------------- LV EF: 60% - 65%  ------------------------------------------------------------------- Indications: CVA 436.  ------------------------------------------------------------------- History: Risk factors: History of heart  transplant. End stage renal disease. Hypertension. Diabetes  mellitus.  ------------------------------------------------------------------- Study Conclusions  - Left ventricle: The cavity size was normal. Wall thickness was increased in a pattern of mild LVH. Systolic function was normal. The estimated ejection fraction was in the range of 60% to 65%. Wall motion was normal; there were no regional wall motion abnormalities. Left ventricular diastolic function parameters were normal. - Left atrium: The atrium was moderately dilated. - Atrial septum: No defect or patent foramen ovale was identified  Antimicrobials:  Vancomycin 6/3 >>  Zosyn 6/3 >>  Subjective: Pt confused, not sure where he is, unable to confirm history. Has no complaints currently.   Objective: Vitals:   04/22/17 0245 04/22/17 0300 04/22/17 0330 04/22/17 0840  BP: (!) 179/68 (!) 169/68 (!) 176/66 (!) 155/61  Pulse: 77 75 75 76  Resp:  18  20  Temp:    98.5 F (36.9 C)  TempSrc:    Oral  SpO2: 98% 98% 98% 98%  Weight:      Height:       General exam: 76 y.o. male in no distress Respiratory system: Non-labored breathing room air. Clear to auscultation bilaterally.  Cardiovascular system: Regular rate and rhythm. No murmur, rub, or gallop. No JVD, and no pedal edema. Gastrointestinal system: Abdomen soft, non-tender, non-distended, with normoactive bowel sounds. No organomegaly or masses felt. Central nervous system: Alert and oriented. No focal neurological deficits. Extremities: Warm, no deformities. LUE AVF +thrill.  Skin: Thin skin throughout with avulsion on left dorsal forearm and raised wound on right dorsal forearm without purulence. Upper lip with raised wound w/eschar without surrounding erythema.   Psychiatry: Judgement and insight appear impaired. Mood & affect appropriate.   CBC:  Recent Labs Lab 04/22/17 0145  WBC 18.2*  NEUTROABS 16.3*  HGB 11.0*  HCT 34.6*  MCV 96.6  PLT 154   Basic Metabolic Panel:  Recent Labs Lab  04/22/17 0145  NA 134*  K 5.1  CL 88*  CO2 30  GLUCOSE 189*  BUN 57*  CREATININE 6.25*  CALCIUM 8.6*   GFR: Estimated Creatinine Clearance: 9.4 mL/min (A) (by C-G formula based on SCr of 6.25 mg/dL (H)). Liver Function Tests:  Recent Labs Lab 04/22/17 0145  AST 23  ALT 21  ALKPHOS 115  BILITOT 0.5  PROT 6.3*  ALBUMIN 3.4*    Recent Results (from the past 240 hour(s))  MRSA PCR Screening     Status: Abnormal   Collection Time: 04/22/17  6:37 AM  Result Value Ref Range Status   MRSA by PCR POSITIVE (A) NEGATIVE Final    Comment:        The GeneXpert MRSA Assay (FDA approved for NASAL specimens only), is one component of a comprehensive MRSA colonization surveillance program. It is not intended to diagnose MRSA infection nor to guide or monitor treatment for MRSA infections. RESULT CALLED TO, READ BACK BY AND VERIFIED WITH: C.HOLDSON RN AT 0800 04/22/17 BY A.DAVIS      Radiology Studies: Dg Chest 2 View  Result Date: 04/22/2017 CLINICAL DATA:  Rash, positive blood cultures EXAM: CHEST  2 VIEW COMPARISON:  02/04/2017, 11/09/2016 FINDINGS: Post sternotomy changes. Ill-defined opacities in the peripheral right upper lobe, right base and left mid lung. No pleural effusion. Mild cardiomegaly with aortic atherosclerosis. No pneumothorax. Vascular stent in the left axilla. IMPRESSION: 1. Ill-defined multifocal opacity in the right upper lobe, right base and left upper lobe suspicious for infiltrates. 2. Cardiomegaly without edema. Electronically Signed  By: Donavan Foil M.D.   On: 04/22/2017 02:07   Time spent: 25 minutes.  Vance Gather, MD [facility] Pager [phone]  If 7PM-7AM, please contact night-coverage [email or web address] Password [email or web address] 04/22/2017, 12:01 PM

## 2017-04-22 NOTE — Progress Notes (Signed)
Pharmacy Antibiotic Note  [name] [name] is a 76 y.o. male admitted on 04/21/2017 with bacteremia.  Per chart review, patient presented to ED in [place] 6/2 for skin tear that happened earlier that day, and was discharged on Augmentin and mupirocin. He was then called today by that ED telling him that his blood cultures were positive for strep and to go back to ED if IV antibiotics. Pharmacy has been consulted for vancomycin and zosyn dosing. Patient is ESRD on HD TTS.   On admission, patient's WBC is elevated and lactic acid within normal limits. Patient was given appropriate loading dose of vancomycin 1500mg  IV x1 in ED and one dose of zosyn.   Plan: Vancomycin 750mg  IV every TTS w/ HD  Zosyn 3.375 gm IV every 12 hours F/U HD schedule to ensure he is on his normal schedule  F/U OSH BCx to narrow (just found strep in ED note)  Height: 5\' 10"  (177.8 cm) Weight: 145 lb (65.8 kg) IBW/kg (Calculated) : 73  Temp (24hrs), Avg:98.6 F (37 C), Min:98.6 F (37 C), Max:98.6 F (37 C)   Recent Labs Lab 04/22/17 0122 04/22/17 0145  WBC  --  18.2*  CREATININE  --  6.25*  LATICACIDVEN 1.8  --     Estimated Creatinine Clearance: 9.4 mL/min (A) (by C-G formula based on SCr of 6.25 mg/dL (H)).    Allergies  Allergen Reactions  . Norvasc [Amlodipine Besylate] Swelling and Other (See Comments)    Swelling of the lower body     Antimicrobials this admission: 6/3 vanc >>  6/3 zosyn >>   Dose adjustments this admission: N/A  Microbiology results: 6/2 OSH BCx: ?strep  6/3 BCx: sent 6/3 MRSA PCR: positive   Thank you for allowing pharmacy to be a part of this patient's care.  Demetrius Charity, PharmD Acute Care Pharmacy Resident  Pager: [phone] 04/22/2017

## 2017-04-22 NOTE — ED Notes (Signed)
Family stated pt is incontinent.

## 2017-04-22 NOTE — Evaluation (Signed)
Physical Therapy Evaluation Patient Details Name: [name] [name] MRN: [id number] DOB: May 22, 1941 Today's Date: 04/22/2017   History of Present Illness  Patient is a 76 yo male admitted 04/21/17 with bacteremia.  Patient traveling with wife in [place] and developed rash.  Patient with abnormal labs, and came to [facility] ED where he was admitted.    PMH:  ESRD on HD, CAD, CABG, heart transplant, DM, mild dementia, CVA, craniotomy for evac of SDH, orthostatic hypotension, anemia, HTN, HLD, HOH  Clinical Impression  Patient presents with problems listed below.  Will benefit from acute PT to maximize functional mobility prior to discharge.  Patient requiring max assist to try to sit today, unable to reach upright balanced position.  Recommend SNF at d/c for continued therapy with goal to return home.   If patient able to improve to min assist level, may be able to return home with wife, caregiver, and HHPT.    Follow Up Recommendations SNF;Supervision/Assistance - 24 hour    Equipment Recommendations  None recommended by PT    Recommendations for Other Services       Precautions / Restrictions Precautions Precautions: Fall Restrictions Weight Bearing Restrictions: No      Mobility  Bed Mobility Overal bed mobility: Needs Assistance Bed Mobility: Supine to Sit;Sit to Supine     Supine to sit: Max assist;HOB elevated Sit to supine: Max assist;HOB elevated   General bed mobility comments: Verbal cues to move to sitting - requiring repeated cues to remain on task.  Patient able to initiate LE movement to EOB.  Required max assist to raise trunk and bring hips to EOB in sitting.  Patient with significant lean to Lt.  Patient unable to bring trunk to upright position.  After 30 seconds, patient abruptly began to lie down.  Assist to control trunk and bring LE's onto bed.  Patient reports "I had to lay down. I don't feel good".  Patient declined further mobility.  Transfers                  General transfer comment: declined  Ambulation/Gait                Stairs            Wheelchair Mobility    Modified Rankin (Stroke Patients Only)       Balance Overall balance assessment: Needs assistance Sitting-balance support: Bilateral upper extremity supported;Feet supported Sitting balance-Leahy Scale: Zero Sitting balance - Comments: Patient requiring max assist to maintain balance, with significant lean to Lt. Postural control: Left lateral lean                                   Pertinent Vitals/Pain Pain Assessment: No/denies pain    Home Living Family/patient expects to be discharged to:: Private residence Living Arrangements: Spouse/significant other Available Help at Discharge: Family;Available 24 hours/day;Personal care attendant (Per patient, Aide in am for bathing/dressing.) Type of Home: Apartment Home Access: Stairs to enter Entrance Stairs-Rails: Right Entrance Stairs-Number of Steps: 3 Home Layout: One level Home Equipment: Walker - 4 wheels;Transport chair;Wheelchair - power;Cane - single point;Bedside commode;Shower seat Additional Comments: Information from patient - ? reliability    Prior Function Level of Independence: Needs assistance   Gait / Transfers Assistance Needed: Short distances to bathroom with RW.  Otherwise uses w/c.  ADL's / Homemaking Assistance Needed: Assist for bathing/dressing. Pt only able to provide limited PLOF data.  Hand Dominance   Dominant Hand: Right    Extremity/Trunk Assessment   Upper Extremity Assessment Upper Extremity Assessment: Generalized weakness    Lower Extremity Assessment Lower Extremity Assessment: Generalized weakness       Communication   Communication: HOH  Cognition Arousal/Alertness: Awake/alert Behavior During Therapy: Flat affect Overall Cognitive Status: No family/caregiver present to determine baseline cognitive functioning                                  General Comments: Able to follow commands with increased time.  Unable to provide accurate PLOF.      General Comments      Exercises     Assessment/Plan    PT Assessment Patient needs continued PT services  PT Problem List Decreased strength;Decreased activity tolerance;Decreased balance;Decreased mobility;Decreased cognition;Decreased knowledge of use of DME;Decreased safety awareness       PT Treatment Interventions DME instruction;Gait training;Functional mobility training;Stair training;Therapeutic exercise;Therapeutic activities;Balance training;Cognitive remediation;Patient/family education    PT Goals (Current goals can be found in the Care Plan section)  Acute Rehab PT Goals Patient Stated Goal: Unable to state PT Goal Formulation: Patient unable to participate in goal setting Time For Goal Achievement: 04/29/17 Potential to Achieve Goals: Fair    Frequency Min 3X/week   Barriers to discharge        Co-evaluation               AM-PAC PT "6 Clicks" Daily Activity  Outcome Measure Difficulty turning over in bed (including adjusting bedclothes, sheets and blankets)?: Total Difficulty moving from lying on back to sitting on the side of the bed? : Total Difficulty sitting down on and standing up from a chair with arms (e.g., wheelchair, bedside commode, etc,.)?: Total Help needed moving to and from a bed to chair (including a wheelchair)?: A Little Help needed walking in hospital room?: A Lot Help needed climbing 3-5 steps with a railing? : A Lot 6 Click Score: 10    End of Session   Activity Tolerance: Patient limited by fatigue Patient left: in bed;with call bell/phone within reach;with bed alarm set Nurse Communication: Mobility status (Recommend need for SNF) PT Visit Diagnosis: Other abnormalities of gait and mobility (R26.89);Unsteadiness on feet (R26.81);Muscle weakness (generalized) (M62.81);Other symptoms and signs  involving the nervous system (S06.301)    Time: 6010-9323 PT Time Calculation (min) (ACUTE ONLY): 12 min   Charges:   PT Evaluation $PT Eval Moderate Complexity: 1 Procedure     PT G Codes:        Carita Pian. Sanjuana Kava, [facility] Acute Rehab Services Pager [place] 04/22/2017, 8:53 PM

## 2017-04-22 NOTE — Consult Note (Signed)
**Note [name]-Identified via Obfuscation** [name] Renal Consultation Note    Indication for Consultation:  Management of ESRD/hemodialysis; anemia, hypertension/volume and secondary hyperparathyroidism PCP: [name] [name], [name] Elk, NP   HPI: [name] [name] is a 76 y.o. male with ESRD on dialysis T,Th,S at [facility]. PMH of CABG 1995, heart transplant 2001 (on immunosuppressant therapy), DMT2, CVA, craniotomy for evacuation of SDH 06/15/217, orthostatic hypotension/autonomic dysfunction, L patellar fx 06/2016, UTI, mild dementia. He is very HOH and wear bilateral hearing aides.   Patient was on vacation with wife to [place], [place]. (He did arrange for hemodialysis while on trip). Apparently he developed rash on both arms and was seen at urgent care who recommended he go to [facility] to be admitted for antibiotic therapy. Wife was afraid to be there alone in a town where she knew no doctors and brought him back to [place] for care. Per family report, they were told blood cultures were positive for strept and told to come to ED.   Upon arrival to ED, patient was found to have T-98.5 BP 155/61 HR 76 RR 20  WBC 18.2 lactic acid 1.8. K+5.1. CXR multifocal opacity R and L upper lobes concerning infiltrates. He has been started on Vanc/Fortaz per primary.  Patient is awake, alert, oriented X 2, but unable to contribute to HPI. HPI gathered from EMR and wife, [name] [name]. He denies C/O discomfort at present, only concern is that he wants a Dr. Malachi [name]. Denies chest pain, SOB. Says he has been feeling bad for several days but can't tell when this started. He has new L skin tear where he hit his arm on walker. He has non-healing skin tear R arm that wife says started back in February from "he picks at his skin".   Patient currently lives at home with wife. He attends HD regularly, does not miss treatments. Does not have high gains, gets to goal. Labs usually WNL.   Past Medical History:  Diagnosis Date  . Anemia    . Cancer ([name] [name])    SKIN  . CKD III-IV    a. since transplant in 2001 with significant worsening since 06/2013.  Marland Kitchen Coronary artery disease   . Dementia    mild  . Diabetes mellitus without complication (HCC)    FASTING CBG 90S  . Headache(784.0)   . Heart transplanted ([place])    a. 1995 CABG x 4 in [place], [place];  b. 2001 Developed CHF;  c. 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 10/2000 Cardiac transplant @ [facility], [place];  e. Historically nl Bx and stress testing.  Last stress test ~ 5 yrs ago.  Marland Kitchen Hernia of abdominal wall   . History of blood transfusion   . History of stroke   . Hypercalcemia    a. 06/2013 Hospitalized in [place].  Marland Kitchen Hyperlipidemia   . Hypertension    a. Long h/o HTN, came off of all meds following transplant/wt loss-->meds resumed 06/2013, difficult to control since.  . Myocardial infarction ([place])    PRIOR TO HEART TRANSPLANT  . Orthostatic dizziness   . Orthostatic hypotension    a. Has tried support hose and compression stockings - prefers not to wear.  . Shortness of breath    EXERTION  . Stroke ([place]) 06/2013   NO RESIDUAL - WAS CONFUSED AT TIME - THAT HAS RESOLVED   Past Surgical History:  Procedure Laterality Date  . BASCILIC VEIN TRANSPOSITION Left 07/06/2014   Procedure: [place];  Surgeon: Rosetta Posner, MD;  Location:  [facility] OR;  Service: Vascular;  Laterality: Left;  . CARDIAC SURGERY    . CATARACT EXTRACTION    . CORONARY ARTERY BYPASS GRAFT    . CRANIOTOMY Right 05/04/2016   Procedure: Right Craniotomy for Evacuation of Subdural Hematoma;  Surgeon: Earnie Larsson, MD;  Location: [facility] NEURO ORS;  Service: Neurosurgery;  Laterality: Right;  . EYE SURGERY Bilateral    CATARACTS  . HEART TRANSPLANT    . PERIPHERAL VASCULAR CATHETERIZATION N/A 09/06/2016   Procedure: Fistulagram - left arm;  Surgeon: Waynetta Sandy, MD;  Location: [facility] CV LAB;  Service: Cardiovascular;  Laterality: N/A;  . PERIPHERAL VASCULAR CATHETERIZATION Left  09/06/2016   Procedure: Peripheral Vascular Intervention;  Surgeon: Waynetta Sandy, MD;  Location: [place] CV LAB;  Service: Cardiovascular;  Laterality: Left;  UPPER ARM COVERED STENT  . SKIN GRAFT FULL THICKNESS LEG     cat scratch that did not heal  . VASECTOMY     Family History  Problem Relation Age of Onset  . Heart attack Father        died @ 37  . Diabetes Father   . Heart attack Mother   . Diabetes Mother   . Kidney disease Maternal Aunt    Social History:  reports that he has never smoked. He has never used smokeless tobacco. He reports that he does not drink alcohol or use drugs. Allergies  Allergen Reactions  . Norvasc [Amlodipine Besylate] Swelling and Other (See Comments)    Swelling of the lower body    Prior to Admission medications   Medication Sig Start Date End Date Taking? Authorizing Provider  acetaminophen (TYLENOL) 325 MG tablet Take 2 tablets (650 mg total) by mouth every 6 (six) hours as needed for mild pain (or Fever >/= 101). Patient taking differently: Take 650 mg by mouth 2 (two) times daily.  10/14/14  Yes Delfina Redwood, MD  amoxicillin-clavulanate (AUGMENTIN) 500-125 MG tablet Take 1 tablet by mouth 3 (three) times daily. 04/20/17  Yes [provider]  aspirin EC 81 MG tablet Take 81 mg by mouth at bedtime.   Yes [provider]  calcium acetate (PHOSLO) 667 MG capsule Take 1 capsule (667 mg total) by mouth 3 (three) times daily with meals. 08/24/16  Yes Ghimire, Henreitta Leber, MD  cholecalciferol (VITAMIN D) 1000 UNITS tablet Take 1,000 Units by mouth daily.   Yes [provider]  cycloSPORINE modified (NEORAL) 25 MG capsule Take 50-75 mg by mouth See admin instructions. 75 mg in the morning and 50 mg in the evening 08/18/14  Yes Crenshaw, Denice Bors, MD  diphenoxylate-atropine (LOMOTIL) 2.5-0.025 MG tablet Take 1 tablet by mouth 4 (four) times daily as needed for diarrhea or loose stools. 08/24/16  Yes Ghimire, Henreitta Leber, MD  donepezil (ARICEPT) 10 MG tablet Take 10 mg by mouth at bedtime.  03/01/15  Yes [provider]  FLUoxetine (PROZAC) 20 MG capsule Take 20 mg by mouth daily.    Yes [provider]  fluticasone (FLONASE) 50 MCG/ACT nasal spray Place 2 sprays into both nostrils at bedtime as needed for allergies.  04/12/16  Yes [provider]  lansoprazole (PREVACID) 30 MG capsule Take 30 mg by mouth 2 (two) times daily before a meal.  12/22/15  Yes [provider]  levothyroxine (SYNTHROID, LEVOTHROID) 75 MCG tablet Take 75 mcg by mouth daily.    Yes [provider]  metoprolol tartrate (LOPRESSOR) 25 MG tablet Take 2 tablets (50 mg  total) by mouth 2 (two) times daily. 12/27/14  Yes Rai, Ripudeep K, MD  multivitamin (RENA-VIT) TABS tablet Take 1 tablet by mouth daily.  03/08/15  Yes [provider]  mycophenolate (CELLCEPT) 250 MG capsule Take 250-500 mg by mouth daily. Take 500 mg every morning and take 250 mg every evening on Sunday, Monday, Wednesday and Friday then take 500 mg every evening on Tuesday Thursday and Saturday   Yes [provider]  pravastatin (PRAVACHOL) 10 MG tablet Take 10 mg by mouth at bedtime.    Yes [provider]  Probiotic Product (PRO-BIOTIC BLEND) CAPS Take 1 capsule by mouth daily.   Yes [provider]  vitamin B-12 (CYANOCOBALAMIN) 1000 MCG tablet Take 1,000 mcg by mouth daily with breakfast.   Yes [provider]  albuterol (PROVENTIL) (2.5 MG/3ML) 0.083% nebulizer solution Take 3 mLs (2.5 mg total) by nebulization every 6 (six) hours as needed for wheezing or shortness of breath. Patient not taking: Reported on 04/22/2017 11/09/16   Ward, Delice Bison, DO  amLODipine (NORVASC) 5 MG tablet Take 1 tablet (5 mg total) by mouth 2 (two) times daily. Patient not taking: Reported on 04/22/2017 02/06/17   Robbie Lis, MD  ciprofloxacin (CIPRO) 500 MG tablet Take 1 tablet (500 mg total) by mouth  daily. Patient not taking: Reported on 04/22/2017 02/06/17   Robbie Lis, MD   Current Facility-Administered Medications  Medication Dose Route Frequency Provider Last Rate Last Dose  . acetaminophen (TYLENOL) tablet 650 mg  650 mg Oral Q6H PRN Reubin Milan, MD      . acidophilus (RISAQUAD) capsule 1 capsule  1 capsule Oral Daily Patrecia Pour, MD      . aspirin EC tablet 81 mg  81 mg Oral QHS Reubin Milan, MD      . calcium acetate [facility]) capsule 667 mg  667 mg Oral TID WC Reubin Milan, MD   667 mg at 04/22/17 0102  . cholecalciferol (VITAMIN D) tablet 1,000 Units  1,000 Units Oral Daily Reubin Milan, MD   1,000 Units at 04/22/17 1144  . cycloSPORINE modified (NEORAL) capsule 50 mg  50 mg Oral QHS Patrecia Pour, MD       And  . cycloSPORINE modified (NEORAL) capsule 75 mg  75 mg Oral Daily Patrecia Pour, MD      . diphenoxylate-atropine (LOMOTIL) 2.5-0.025 MG per tablet 1 tablet  1 tablet Oral QID PRN Reubin Milan, MD      . donepezil (ARICEPT) tablet 10 mg  10 mg Oral QHS Reubin Milan, MD      . enoxaparin (LOVENOX) injection 30 mg  30 mg Subcutaneous Q24H Reubin Milan, MD   30 mg at 04/22/17 1145  . FLUoxetine (PROZAC) capsule 20 mg  20 mg Oral Daily Reubin Milan, MD      . fluticasone [facility]) 50 MCG/ACT nasal spray 2 spray  2 spray Each Nare QHS PRN Reubin Milan, MD      . insulin aspart (novoLOG) injection 0-9 Units  0-9 Units Subcutaneous TID WC Reubin Milan, MD   3 Units at 04/22/17 1309  . levothyroxine (SYNTHROID, LEVOTHROID) tablet 75 mcg  75 mcg Oral QAC breakfast Reubin Milan, MD   75 mcg at 04/22/17 [phone]  . metoprolol tartrate (LOPRESSOR) tablet 50 mg  50 mg Oral BID Reubin Milan, MD      . multivitamin (RENA-VIT) tablet 1 tablet  1  tablet Oral QHS Reubin Milan, MD      . mycophenolate (CELLCEPT) capsule 500 mg  500 mg Oral Daily Patrecia Pour, MD       And  . mycophenolate (CELLCEPT)  capsule 250 mg  250 mg Oral Once per day on Sun Mon Wed Fri Patrecia Pour, MD       And  . Derrill Memo ON 04/24/2017] mycophenolate (CELLCEPT) capsule 500 mg  500 mg Oral Once per day on Tue Thu Sat Vance Gather B, MD      . ondansetron [facility]) tablet 8 mg  8 mg Oral Q8H PRN Patrecia Pour, MD   8 mg at 04/22/17 1309  . pantoprazole (PROTONIX) EC tablet 40 mg  40 mg Oral Daily Reubin Milan, MD      . piperacillin-tazobactam (ZOSYN) IVPB 3.375 g  3.375 g Intravenous Q12H Vance Gather B, MD 12.5 mL/hr at 04/22/17 1308 3.375 g at 04/22/17 1308  . pravastatin (PRAVACHOL) tablet 10 mg  10 mg Oral QHS Reubin Milan, MD      . Derrill Memo ON 04/24/2017] vancomycin (VANCOCIN) IVPB 750 mg/150 ml premix  750 mg Intravenous Q T,Th,Sa-HD Patrecia Pour, MD      . vitamin B-12 (CYANOCOBALAMIN) tablet 1,000 mcg  1,000 mcg Oral Q breakfast Reubin Milan, MD   1,000 mcg at 04/22/17 0973   Labs: Basic Metabolic Panel:  Recent Labs Lab 04/22/17 0145  NA 134*  K 5.1  CL 88*  CO2 30  GLUCOSE 189*  BUN 57*  CREATININE 6.25*  CALCIUM 8.6*   Liver Function Tests:  Recent Labs Lab 04/22/17 0145  AST 23  ALT 21  ALKPHOS 115  BILITOT 0.5  PROT 6.3*  ALBUMIN 3.4*   CBC:  Recent Labs Lab 04/22/17 0145  WBC 18.2*  NEUTROABS 16.3*  HGB 11.0*  HCT 34.6*  MCV 96.6  PLT 203    CBG:  Recent Labs Lab 04/22/17 1251  GLUCAP 211*    Studies/Results: Dg Chest 2 View  Result Date: 04/22/2017 CLINICAL DATA:  Rash, positive blood cultures EXAM: CHEST  2 VIEW COMPARISON:  02/04/2017, 11/09/2016 FINDINGS: Post sternotomy changes. Ill-defined opacities in the peripheral right upper lobe, right base and left mid lung. No pleural effusion. Mild cardiomegaly with aortic atherosclerosis. No pneumothorax. Vascular stent in the left axilla. IMPRESSION: 1. Ill-defined multifocal opacity in the right upper lobe, right base and left upper lobe suspicious for infiltrates. 2. Cardiomegaly without edema.  Electronically Signed   By: Donavan Foil M.D.   On: 04/22/2017 02:07    ROS: As per HPI otherwise negative.   Physical Exam: Vitals:   04/22/17 0245 04/22/17 0300 04/22/17 0330 04/22/17 0840  BP: (!) 179/68 (!) 169/68 (!) 176/66 (!) 155/61  Pulse: 77 75 75 76  Resp:  18  20  Temp:    98.5 F (36.9 C)  TempSrc:    Oral  SpO2: 98% 98% 98% 98%  Weight:      Height:         General: Chronically ill appearing, thin pt in NAD Head: Normocephalic, atraumatic, sclera non-icteric, mucus membranes are moist Neck: Supple. JVD not elevated. Lungs: Clear bilaterally to auscultation without wheezes, rales, or rhonchi. Breathing is unlabored. Slightly decreased in bases.  Heart: RRR with S1 S2. No murmurs, rubs, or gallops appreciated. Abdomen: Soft, non-tender, non-distended with normoactive bowel sounds. No rebound/guarding. No obvious abdominal masses. M-S:  Strength and tone appear normal for age. Lower extremities:without  edema or ischemic changes, no open wounds  Neuro: Alert and oriented X 3. Moves all extremities spontaneously. Skin: Has bleeding skin tear LFA, skin tear RFA. Thin fragile skin integrity.  Psych:  Responds to questions appropriately with a normal affect. Dialysis Access: LFA AVF + bruit.   Dialysis Orders: [place] T, Th,S 4 hrs 2.0 K/2.25 Ca 64.5 kg  400/Auto 1.5 -No Heparin  -Venofer 50mg  IV weekly (Last dose 04/12/17 Fe 106 Tsat 45% 04/12/17)    Assessment/Plan: 1.  Bacteremia: Leukocytosis, CXR concerning for PNA. BC pending. Fortaz/Vanc per primary 2.  ESRD -  MWF next HD tomorrow. No heparin. K+ 5.1 Do 1st shift in AM.  3.  Hypertension/volume  - Hx of orthostatic hypotension, BP usually high prior to HD then falls during treatment. SBP 179-151. Restart  Amlodipine 5 mg PO BID, continue metoprolol 50 mg PO BID. Wt 65.8 kg attempt UFG 1.5-2 liters as tolerated in HD tomorrow. No evidence of volume overload by exam.  4.  Anemia  - HGB 11.0 no ESA 5.  Metabolic  bone disease - Ca 8.6 Continue binders.  6.  Nutrition - Albumin 3.4. Renal/Carb mod. Renal vit/nepro.  7.  DM: per primary 8.  S/P Heart Transplant: Continue immunosuppressant therapy.  9.  Hypothyroidism: per primary  Elizabella Nolet H. Owens Shark, NP-C 04/22/2017, 1:42 PM  [place]

## 2017-04-22 NOTE — ED Notes (Signed)
ED Provider at bedside. 

## 2017-04-22 NOTE — Consult Note (Signed)
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 Immunesuppression is cyclosporine plus cellcept. his post transplant course notable for 2R rejection in 2003, switched from azathioprine to cellcept. Also CMV disease for a few years. He has ESRD on HD T,Th,S at [facility]- HD started in Nov 2015 thru LUE fistula. He also has hx of chronic subdural hematoma and hx of recurrents falls, CVA, craniotomy for evacuation of SDH 06/15/217, orthostatic hypotension/autonomic dysfunction, L patellar fx 06/2016, UTI, mild dementia and wears bilateral hearing aids.  Patient was referred to come to hospital for admission due to + blood cx from recent UC visit in [place]. He was recently in [place], [place] for vacation when he developed rash to both upper extremities and seen at [facility] for cellulitis. Per family report, they were told blood cultures were positive for strept and told to come to ED. He was afebrile at 98.55F, BP normal but WBC elevated at 18.2K. Physical exam is notable for bilateral skin tears to fore arms but no significant erythema. He has facial lesion with scab slightly smaller than a dime beneath his left nostril that is unclear how long it has been there per the patient. CXR showing bilateral patchy infiltrate per my read. He was started on vancomycin and piptazo  History extracted from chart. Patient is poor historian 2/2  dementia/stroke  I have reviewed chart  Past Medical History:  Diagnosis Date  . Anemia   . Cancer ([place])    SKIN  . CKD III-IV    a. since transplant in 2001 with significant worsening since 06/2013.  Marland Kitchen Coronary artery disease   . Dementia    mild  . Diabetes mellitus without complication (HCC)    FASTING CBG 90S  . Headache(784.0)   . 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 1995 CABG x 4 in [place], [place];  b. 2001 Developed CHF;  c. 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 10/2000 Cardiac transplant @ [facility], [place];  e. Historically nl Bx and stress testing.  Last stress test ~ 5 yrs ago.  Marland Kitchen Hernia of abdominal wall   . History of blood transfusion   . History of stroke   . Hypercalcemia    a. 06/2013 Hospitalized in [place].  Marland Kitchen Hyperlipidemia   . Hypertension    a. Long h/o HTN, came off of all meds following transplant/wt loss-->meds resumed 06/2013, difficult to control since.  . Myocardial infarction ([place])    PRIOR TO HEART TRANSPLANT  . Orthostatic dizziness   . Orthostatic hypotension    a. Has tried support hose and compression stockings - prefers not to wear.  . Shortness of breath    EXERTION  . Stroke ([place]) 06/2013   NO RESIDUAL - WAS CONFUSED AT TIME - THAT HAS RESOLVED    Allergies:  Allergies  Allergen  Reactions  . Norvasc [Amlodipine Besylate] Swelling and Other (See Comments)    Swelling of the lower body     MEDICATIONS: . acidophilus  1 capsule Oral Daily  . amLODipine  5 mg Oral BID  . aspirin EC  81 mg Oral QHS  . calcium acetate  667 mg Oral TID WC  . cholecalciferol  1,000 Units Oral Daily  . cycloSPORINE modified  50 mg Oral QHS   And  . cycloSPORINE modified  75 mg Oral Daily  . donepezil  10 mg Oral QHS  . enoxaparin (LOVENOX) injection  30 mg Subcutaneous Q24H  . FLUoxetine  20 mg Oral Daily  . insulin aspart  0-9 Units Subcutaneous TID WC  . levothyroxine  75 mcg Oral QAC breakfast  . metoprolol tartrate  50 mg Oral BID  . multivitamin  1 tablet  Oral QHS  . mycophenolate  500 mg Oral Daily   And  . mycophenolate  250 mg Oral Once per day on Sun Mon Wed Fri   And  . [START ON 04/24/2017] mycophenolate  500 mg Oral Once per day on Tue Thu Sat  . pantoprazole  40 mg Oral Daily  . pravastatin  10 mg Oral QHS  . vitamin B-12  1,000 mcg Oral Q breakfast    Social History  Substance Use Topics  . Smoking status: Never Smoker  . Smokeless tobacco: Never Used  . Alcohol use No    Family History  Problem Relation Age of Onset  . Heart attack Father        died @ 75  . Diabetes Father   . Heart attack Mother   . Diabetes Mother   . Kidney disease Maternal Aunt      Review of Systems  Constitutional: + for fever, chills, diaphoresis, activity change, appetite change, fatigue and unexpected weight change.  HENT: Negative for congestion, sore throat, rhinorrhea, sneezing, trouble swallowing and sinus pressure.  Eyes: Negative for photophobia and visual disturbance.  Respiratory: + for cough, chest tightness, shortness of breath, wheezing and stridor.  Cardiovascular: Negative for chest pain, palpitations and leg swelling.  Gastrointestinal: Negative for nausea, vomiting, abdominal pain, diarrhea, constipation, blood in stool, abdominal distention and anal bleeding.  Genitourinary: Negative for dysuria, hematuria, flank pain and difficulty urinating.  Musculoskeletal: Negative for myalgias, back pain, joint swelling, arthralgias and gait problem.  Skin: + skin tears to arms. And facial lesion. Negative for color change, pallor, rash and wound.  Neurological: Negative for dizziness, tremors, weakness and light-headedness.  Hematological: Negative for adenopathy. Does not bruise/bleed easily.  Psychiatric/Behavioral: Negative for behavioral problems, confusion, sleep disturbance, dysphoric mood, decreased concentration and agitation.     OBJECTIVE: Temp:  [98.5 F (36.9 C)-98.6 F (37 C)] 98.5 F (36.9 C) (06/03 0840) Pulse  Rate:  [73-88] 76 (06/03 0840) Resp:  [18-20] 20 (06/03 0840) BP: (138-225)/(61-117) 155/61 (06/03 0840) SpO2:  [96 %-100 %] 98 % (06/03 0840) Weight:  [145 lb (65.8 kg)] 145 lb (65.8 kg) (06/03 0100) Physical Exam  Constitutional: He is oriented to person, only. Wearing bilateral hearing aids He appears well-developed and well-nourished. No distress.  HENT:  Mouth/Throat: Oropharynx is clear and moist. No oropharyngeal exudate.  Cardiovascular: Normal rate, regular rhythm and normal heart sounds. Exam reveals no gallop and no friction rub.  No murmur heard.  Pulmonary/Chest: Effort normal and breath sounds normal. No respiratory distress. He has no wheezes. Decrease breath sounds at bases Chest wall = surgical scar well healed  Abdominal: Soft. Bowel sounds are normal. He exhibits no distension. There is no tenderness.  Lymphadenopathy:  He has no cervical adenopathy.  Neurological: He is alert and oriented to person, place, and time.  Ext: left fistula + thrill. Skin tears bilateral forearms with fibrinous exudate Skin: Scattered echymosis  Psychiatric: pleasant but only oriented x 1, unclear if this is his baseline    LABS: Results for orders placed or performed during the hospital encounter of 04/21/17 (from the past 48 hour(s))  Lactic acid, plasma     Status: None   Collection Time: 04/22/17  1:22 AM  Result Value Ref Range   Lactic Acid, Venous 1.8 0.5 - 1.9 mmol/L  CBC with Differential/Platelet     Status: Abnormal   Collection Time: 04/22/17  1:45 AM  Result Value Ref Range   WBC 18.2 (H) 4.0 - 10.5 K/uL   RBC 3.58 (L) 4.22 - 5.81 MIL/uL   Hemoglobin 11.0 (L) 13.0 - 17.0 g/dL   HCT 34.6 (L) 39.0 - 52.0 %   MCV 96.6 78.0 - 100.0 fL   MCH 30.7 26.0 - 34.0 pg   MCHC 31.8 30.0 - 36.0 g/dL   RDW 15.2 11.5 - 15.5 %   Platelets 203 150 - 400 K/uL   Neutrophils Relative % 90 %   Neutro Abs 16.3 (H) 1.7 - 7.7 K/uL   Lymphocytes Relative 6 %   Lymphs Abs 1.1 0.7 - 4.0 K/uL    Monocytes Relative 4 %   Monocytes Absolute 0.7 0.1 - 1.0 K/uL   Eosinophils Relative 0 %   Eosinophils Absolute 0.0 0.0 - 0.7 K/uL   Basophils Relative 0 %   Basophils Absolute 0.0 0.0 - 0.1 K/uL  Comprehensive metabolic panel     Status: Abnormal   Collection Time: 04/22/17  1:45 AM  Result Value Ref Range   Sodium 134 (L) 135 - 145 mmol/L   Potassium 5.1 3.5 - 5.1 mmol/L   Chloride 88 (L) 101 - 111 mmol/L   CO2 30 22 - 32 mmol/L   Glucose, Bld 189 (H) 65 - 99 mg/dL   BUN 57 (H) 6 - 20 mg/dL   Creatinine, Ser 6.25 (H) 0.61 - 1.24 mg/dL   Calcium 8.6 (L) 8.9 - 10.3 mg/dL   Total Protein 6.3 (L) 6.5 - 8.1 g/dL   Albumin 3.4 (L) 3.5 - 5.0 g/dL   AST 23 15 - 41 U/L   ALT 21 17 - 63 U/L   Alkaline Phosphatase 115 38 - 126 U/L   Total Bilirubin 0.5 0.3 - 1.2 mg/dL   GFR calc non Af Amer 8 (L) >60 mL/min   GFR calc Af Amer 9 (L) >60 mL/min    Comment: (NOTE) The eGFR has been calculated using the CKD EPI equation. This calculation has not been validated in all clinical situations. eGFR's persistently <60 mL/min signify possible Chronic Kidney Disease.    Anion gap 16 (H) 5 - 15  MRSA PCR Screening     Status: Abnormal   Collection Time: 04/22/17  6:37 AM  Result Value Ref Range   MRSA by PCR POSITIVE (A) NEGATIVE    Comment:        The GeneXpert MRSA Assay (FDA approved for NASAL specimens only), is one component of a comprehensive MRSA colonization surveillance program. It is not intended to diagnose MRSA infection nor to guide or monitor treatment for MRSA infections. RESULT CALLED TO, READ BACK BY AND VERIFIED WITH: C.HOLDSON RN AT 0800  04/22/17 BY A.DAVIS   Glucose, capillary     Status: Abnormal   Collection Time: 04/22/17 12:51 PM  Result Value Ref Range   Glucose-Capillary 211 (H) 65 - 99 mg/dL    MICRO: 6/3 mrsa colonized 6/3 blood cx pending IMAGING: Dg Chest 2 View  Result Date: 04/22/2017 CLINICAL DATA:  Rash, positive blood cultures EXAM: CHEST  2  VIEW COMPARISON:  02/04/2017, 11/09/2016 FINDINGS: Post sternotomy changes. Ill-defined opacities in the peripheral right upper lobe, right base and left mid lung. No pleural effusion. Mild cardiomegaly with aortic atherosclerosis. No pneumothorax. Vascular stent in the left axilla. IMPRESSION: 1. Ill-defined multifocal opacity in the right upper lobe, right base and left upper lobe suspicious for infiltrates. 2. Cardiomegaly without edema. Electronically Signed   By: Donavan Foil M.D.   On: 04/22/2017 02:07   Assessment/Plan:  76yo M immunocompromised host due to Benedict on cyclosporine and cellcept presents with ?AMS, streptococcal bacteremia, currently on broad spectrum abtx. May also have pneumonia but unclear if this primary process  - continue on vanco and piptazo - track down records for UC to find out micro results - await results taken here to see if he still has ongoing bacteremia - minimum of 2 wk but will likely need work up to rule out endocarditis  Facial lesion = would ask family how long that has been present and what it looked like initially. It has yellowish crust that makes me think of strep but unsure if it was originally vesicular  ESRD = can dose abtx with HD sessions  pna = currently on hcap coverage  Dementia = continue to monitor if he is worse than his baseline.   OHT= continue on his home doses of immunesuppression  Scattered nodular eschar skin lesions = I suspect this is due to skin lesions noted with cyclosporin. Continue to monitor if any get infected  Skin tears = defer to wound care recs  DR comer to see tomorrow

## 2017-04-23 DIAGNOSIS — D638 Anemia in other chronic diseases classified elsewhere: Secondary | ICD-10-CM

## 2017-04-23 LAB — RENAL FUNCTION PANEL
Albumin: 2.6 g/dL — ABNORMAL LOW (ref 3.5–5.0)
Anion gap: 17 — ABNORMAL HIGH (ref 5–15)
BUN: 86 mg/dL — ABNORMAL HIGH (ref 6–20)
CO2: 25 mmol/L (ref 22–32)
Calcium: 8 mg/dL — ABNORMAL LOW (ref 8.9–10.3)
Chloride: 91 mmol/L — ABNORMAL LOW (ref 101–111)
Creatinine, Ser: 7.43 mg/dL — ABNORMAL HIGH (ref 0.61–1.24)
GFR calc Af Amer: 7 mL/min — ABNORMAL LOW (ref >60)
GFR calc non Af Amer: 6 mL/min — ABNORMAL LOW (ref >60)
Glucose, Bld: 94 mg/dL (ref 65–99)
Phosphorus: 5.2 mg/dL — ABNORMAL HIGH (ref 2.5–4.6)
Potassium: 5.6 mmol/L — ABNORMAL HIGH (ref 3.5–5.1)
Sodium: 133 mmol/L — ABNORMAL LOW (ref 135–145)

## 2017-04-23 LAB — BASIC METABOLIC PANEL
BUN: 86 — AB (ref 4–21)
Creatinine: 7.4 — AB (ref 0.6–1.3)
GLUCOSE: 94
Potassium: 5.6 — AB (ref 3.4–5.3)
Sodium: 133 — AB (ref 137–147)

## 2017-04-23 LAB — CBC WITH DIFFERENTIAL/PLATELET
BASOS PCT: 0 %
Basophils Absolute: 0 10*3/uL (ref 0.0–0.1)
EOS ABS: 0 10*3/uL (ref 0.0–0.7)
Eosinophils Relative: 0 %
HCT: 32.9 % — ABNORMAL LOW (ref 39.0–52.0)
Hemoglobin: 10.3 g/dL — ABNORMAL LOW (ref 13.0–17.0)
LYMPHS ABS: 2.1 10*3/uL (ref 0.7–4.0)
Lymphocytes Relative: 11 %
MCH: 29.9 pg (ref 26.0–34.0)
MCHC: 31.3 g/dL (ref 30.0–36.0)
MCV: 95.6 fL (ref 78.0–100.0)
MONO ABS: 0.8 10*3/uL (ref 0.1–1.0)
MONOS PCT: 4 %
Neutro Abs: 15.8 10*3/uL — ABNORMAL HIGH (ref 1.7–7.7)
Neutrophils Relative %: 85 %
Platelets: 166 10*3/uL (ref 150–400)
RBC: 3.44 MIL/uL — ABNORMAL LOW (ref 4.22–5.81)
RDW: 15.4 % (ref 11.5–15.5)
WBC: 18.8 10*3/uL — ABNORMAL HIGH (ref 4.0–10.5)

## 2017-04-23 LAB — GLUCOSE, CAPILLARY
GLUCOSE-CAPILLARY: 99 mg/dL (ref 65–99)
GLUCOSE-CAPILLARY: 208 mg/dL — AB (ref 65–99)
Glucose-Capillary: 143 mg/dL — ABNORMAL HIGH (ref 65–99)

## 2017-04-23 LAB — CBC AND DIFFERENTIAL
HEMATOCRIT: 33 — AB (ref 41–53)
HEMOGLOBIN: 10.3 — AB (ref 13.5–17.5)
Platelets: 166 (ref 150–399)
WBC: 18.8

## 2017-04-23 MED ORDER — LIDOCAINE HCL (PF) 1 % IJ SOLN: 5.0000 mL | INTRAMUSCULAR | Status: DC | PRN

## 2017-04-23 MED ORDER — HEPARIN SODIUM (PORCINE) 1000 UNIT/ML DIALYSIS: 1000.0000 [IU] | INTRAMUSCULAR | Status: DC | PRN

## 2017-04-23 MED ORDER — PENTAFLUOROPROP-TETRAFLUOROETH EX AERO: 1.0000 "application " | INHALATION_SPRAY | CUTANEOUS | Status: DC | PRN

## 2017-04-23 MED ORDER — LIDOCAINE-PRILOCAINE 2.5-2.5 % EX CREA: 1.0000 "application " | TOPICAL_CREAM | CUTANEOUS | Status: DC | PRN

## 2017-04-23 MED ORDER — ALTEPLASE 2 MG IJ SOLR: 2.0000 mg | Freq: Once | INTRAMUSCULAR | Status: DC | PRN

## 2017-04-23 MED ORDER — SODIUM CHLORIDE 0.9 % IV SOLN: 100.0000 mL | INTRAVENOUS | Status: DC | PRN

## 2017-04-23 MED ORDER — NEPRO/CARBSTEADY PO LIQD
237.0000 mL | Freq: Two times a day (BID) | ORAL | Status: DC
  Administered 2017-04-23 – 2017-04-26 (×4): 237 mL via ORAL

## 2017-04-23 MED ORDER — MORPHINE SULFATE (PF) 2 MG/ML IV SOLN
1.0000 mg | INTRAVENOUS | Status: DC | PRN
Start: 2017-04-23 — End: 2017-04-26
  Administered 2017-04-23: 1 mg via INTRAVENOUS
  Filled 2017-04-23 (×2): qty 1

## 2017-04-23 MED ORDER — HEPARIN SODIUM (PORCINE) 5000 UNIT/ML IJ SOLN
5000.0000 [IU] | Freq: Three times a day (TID) | INTRAMUSCULAR | Status: DC
  Administered 2017-04-25 (×2): 5000 [IU] via SUBCUTANEOUS
  Filled 2017-04-23 (×2): qty 1

## 2017-04-23 MED ORDER — VANCOMYCIN HCL IN DEXTROSE 750-5 MG/150ML-% IV SOLN
INTRAVENOUS | Status: AC
  Administered 2017-04-23: 750 mg via INTRAVENOUS
  Filled 2017-04-23: qty 150

## 2017-04-23 MED ORDER — VANCOMYCIN HCL IN DEXTROSE 750-5 MG/150ML-% IV SOLN
750.0000 mg | INTRAVENOUS | Status: AC
  Administered 2017-04-23: 750 mg via INTRAVENOUS

## 2017-04-23 NOTE — Evaluation (Signed)
Clinical/Bedside Swallow Evaluation Patient Details  Name: [name] [name] MRN: [id number] Date of Birth: 13-Feb-1941  Today's Date: 04/23/2017 Time: SLP Start Time (ACUTE ONLY): 1403 SLP Stop Time (ACUTE ONLY): 1424 SLP Time Calculation (min) (ACUTE ONLY): 21 min  Past Medical History:  Past Medical History:  Diagnosis Date  . Anemia   . Cancer ([place])    SKIN  . CKD III-IV    a. since transplant in 2001 with significant worsening since 06/2013.  Marland Kitchen Coronary artery disease   . Dementia    mild  . Diabetes mellitus without complication (HCC)    FASTING CBG 90S  . Headache(784.0)   . 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 1995 CABG x 4 in [place], [place];  b. 2001 Developed CHF;  c. 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 10/2000 Cardiac transplant @ [facility], [place];  e. Historically nl Bx and stress testing.  Last stress test ~ 5 yrs ago.  Marland Kitchen Hernia of abdominal wall   . History of blood transfusion   . History of stroke   . Hypercalcemia    a. 06/2013 Hospitalized in [place].  Marland Kitchen Hyperlipidemia   . Hypertension    a. Long h/o HTN, came off of all meds following transplant/wt loss-->meds resumed 06/2013, difficult to control since.  . Myocardial infarction ([place])    PRIOR TO HEART TRANSPLANT  . Orthostatic dizziness   . Orthostatic hypotension    a. Has tried support hose and compression stockings - prefers not to wear.  . Shortness of breath    EXERTION  . Stroke ([place]) 06/2013   NO RESIDUAL - WAS CONFUSED AT TIME - THAT HAS RESOLVED   Past Surgical History:  Past Surgical History:  Procedure Laterality Date  . BASCILIC VEIN TRANSPOSITION Left 07/06/2014   Procedure: [place];  Surgeon: Rosetta Posner, MD;  Location: [facility];  Service: Vascular;  Laterality: Left;  . CARDIAC SURGERY    . CATARACT EXTRACTION    . CORONARY ARTERY BYPASS GRAFT    . CRANIOTOMY Right 05/04/2016   Procedure: Right Craniotomy for Evacuation of Subdural Hematoma;  Surgeon: Earnie Larsson, MD;  Location: [facility]  NEURO ORS;  Service: Neurosurgery;  Laterality: Right;  . EYE SURGERY Bilateral    CATARACTS  . HEART TRANSPLANT    . PERIPHERAL VASCULAR CATHETERIZATION N/A 09/06/2016   Procedure: Fistulagram - left arm;  Surgeon: Waynetta Sandy, MD;  Location: [facility] CV LAB;  Service: Cardiovascular;  Laterality: N/A;  . PERIPHERAL VASCULAR CATHETERIZATION Left 09/06/2016   Procedure: Peripheral Vascular Intervention;  Surgeon: Waynetta Sandy, MD;  Location: [facility] CV LAB;  Service: Cardiovascular;  Laterality: Left;  UPPER ARM COVERED STENT  . SKIN GRAFT FULL THICKNESS LEG     cat scratch that did not heal  . VASECTOMY     HPI:  Patient is a 76 yo male admitted 04/21/17 with bacteremia.  Patient traveling with wife in [place] and developed rash.  Patient with abnormal labs, and came to [facility] ED where he was admitted.    PMH:  ESRD on HD, CAD, CABG, heart transplant, DM, mild dementia, CVA, craniotomy for evac of SDH, orthostatic hypotension, anemia, HTN, HLD, HOH. CXR Ill-defined multifocal opacity in the right upper lobe, right base and left upper lobe suspicious for infiltrates.   Assessment / Plan / Recommendation Clinical Impression  Pt's mastication was slow but functional during assessment. No indications of airway compromise however at increased risk for oral pocketing due to cognitive  impairments. He required assist for set up and scooping assist. No further ST needed.   SLP Visit Diagnosis: Dysphagia, unspecified (R13.10)    Aspiration Risk  Mild aspiration risk    Diet Recommendation Regular;Thin liquid   Liquid Administration via: Straw Medication Administration: Whole meds with puree Supervision: Patient able to self feed;Staff to assist with self feeding Compensations: Slow rate;Small sips/bites;Minimize environmental distractions Postural Changes: Seated upright at 90 degrees    Other  Recommendations Oral Care Recommendations: Oral care BID   Follow up  Recommendations None      Frequency and Duration            Prognosis        Swallow Study   General HPI: Patient is a 76 yo male admitted 04/21/17 with bacteremia.  Patient traveling with wife in [place] and developed rash.  Patient with abnormal labs, and came to [facility] ED where he was admitted.    PMH:  ESRD on HD, CAD, CABG, heart transplant, DM, mild dementia, CVA, craniotomy for evac of SDH, orthostatic hypotension, anemia, HTN, HLD, HOH. CXR Ill-defined multifocal opacity in the right upper lobe, right base and left upper lobe suspicious for infiltrates. Type of Study: Bedside Swallow Evaluation Previous Swallow Assessment:  (none) Diet Prior to this Study: Regular;Thin liquids Temperature Spikes Noted: No Respiratory Status: Room air History of Recent Intubation: No Behavior/Cognition: Alert;Cooperative;Requires cueing (HOH) Oral Cavity Assessment: Within Functional Limits Oral Care Completed by SLP: No Oral Cavity - Dentition: Dentures, bottom (no upper dentition) Vision: Functional for self-feeding Self-Feeding Abilities: Needs assist;Needs set up;Able to feed self Patient Positioning: Upright in bed Baseline Vocal Quality: Low vocal intensity Volitional Cough: Weak Volitional Swallow: Able to elicit    Oral/Motor/Sensory Function Overall Oral Motor/Sensory Function: Within functional limits   Ice Chips Ice chips: Not tested   Thin Liquid Thin Liquid: Within functional limits Presentation: Cup;Straw    Nectar Thick Nectar Thick Liquid: Not tested   Honey Thick Honey Thick Liquid: Not tested   Puree Puree: Not tested   Solid   GO   Solid: Within functional limits        Mick Sell Orbie Pyo 04/23/2017,2:40 PM  Orbie Pyo Colvin Caroli.Ed [employer] [phone]

## 2017-04-23 NOTE — Progress Notes (Signed)
PT Cancellation Note  Patient Details Name: [name] [name] MRN: [id number] DOB: 1941-07-02   Cancelled Treatment:    Reason Eval/Treat Not Completed: Patient at procedure or test/unavailable   Currently in HD;  Will follow up later today as time allows;  Otherwise, will follow up for PT tomorrow;   Thank you,  [name], PT  Acute Rehabilitation Services Pager [phone] Office [place] 04/23/2017, 8:11 AM

## 2017-04-23 NOTE — Consult Note (Addendum)
[facility] Nurse wound consult note Reason for Consult: Consult requested for left and right arms.   Wound type: Left arm with partial thickness skin tear; 4X2X.1cm, red and moist, no odor, small amt pink drainage. Right arm with red full thickness lesion; raised slightly above skin level, 2X1X.1cm, moist woundbed with small amt tan drainage. Appearance is not consistent of a skin tear or abrasion. Dressing procedure/placement/frequency: Foam dressing to protect from further injury and absorb drainage.  Recommend pt follow-up after discharge with a dermatologist for the right arm lesion for possible biopsy since it is atypical.  Discussed plan of care with patient and he nodded understanding, but did not speak or answer questions. Please re-consult if further assistance is needed.  Thank-you,  Julien Girt MSN, [place], [place], [place], [place]

## 2017-04-23 NOTE — Progress Notes (Signed)
    [facility] for Infectious Disease   Reason for visit: Follow up on Streptococcal bacteremia  Interval History: complains of pain of back, shoulders, states it is chronic; WBC remains around 18, afebrile  Physical Exam: Constitutional:  Vitals:   04/23/17 1239 04/23/17 1300  BP: (!) 131/57 (!) 122/57  Pulse: 74 76  Resp: 19   Temp: 97.4 F (36.3 C)    patient appears in NAD Respiratory: Normal respiratory effort; CTA B Cardiovascular: RRR GI: soft, nt, nd  Review of Systems: Gastrointestinal: negative for diarrhea  Lab Results  Component Value Date   WBC 18.8 (H) 04/23/2017   HGB 10.3 (L) 04/23/2017   HCT 32.9 (L) 04/23/2017   MCV 95.6 04/23/2017   PLT 166 04/23/2017    Lab Results  Component Value Date   CREATININE 7.43 (H) 04/23/2017   BUN 86 (H) 04/23/2017   NA 133 (L) 04/23/2017   K 5.6 (H) 04/23/2017   CL 91 (L) 04/23/2017   CO2 25 04/23/2017    Lab Results  Component Value Date   ALT 21 04/22/2017   AST 23 04/22/2017   ALKPHOS 115 04/22/2017     Microbiology: Recent Results (from the past 240 hour(s))  Blood culture (routine x 2)     Status: None (Preliminary result)   Collection Time: 04/22/17  1:45 AM  Result Value Ref Range Status   Specimen Description BLOOD RIGHT ANTECUBITAL  Final   Special Requests   Final    BOTTLES DRAWN AEROBIC AND ANAEROBIC Blood Culture adequate volume   Culture NO GROWTH 1 DAY  Final   Report Status PENDING  Incomplete  Blood culture (routine x 2)     Status: None (Preliminary result)   Collection Time: 04/22/17  2:15 AM  Result Value Ref Range Status   Specimen Description BLOOD RIGHT ARM  Final   Special Requests   Final    BOTTLES DRAWN AEROBIC AND ANAEROBIC Blood Culture adequate volume   Culture NO GROWTH 1 DAY  Final   Report Status PENDING  Incomplete  MRSA PCR Screening     Status: Abnormal   Collection Time: 04/22/17  6:37 AM  Result Value Ref Range Status   MRSA by PCR POSITIVE (A) NEGATIVE  Final    Comment:        The GeneXpert MRSA Assay (FDA approved for NASAL specimens only), is one component of a comprehensive MRSA colonization surveillance program. It is not intended to diagnose MRSA infection nor to guide or monitor treatment for MRSA infections. RESULT CALLED TO, READ BACK BY AND VERIFIED WITH: C.HOLDSON RN AT 0800 04/22/17 BY A.DAVIS     Impression/Plan:  1. Bacteremia - no growth on blood cultures here to date.  Continue with vancomycin and zosyn for now. 2. History of heart transplant - on immunsuppresive regimen.

## 2017-04-23 NOTE — Progress Notes (Addendum)
PROGRESS NOTE  [name] [name]  ZJQ:[id number] DOB: [birth year] DOA: 04/21/2017 PCP: Junie Panning, NP   Brief Narrative: [name] [name] is a 76 y.o. male with a history of heart transplant, ESRD, CAD, T2DM, HTN, HLD, and dementia who presented to the ED due to report of positive blood cultures. While visiting [place], [place] last week, he had been evaluated at urgent care for a left arm wound on 6/1. He was prescribed augmentin, and topical mupirocin ointment and sent home. That night he became nonverbal, not able to follow commands, so he was taken to [facility] in [place], [place] in the very early morning hours of 6/2. Work up there included CT head, imaging of his abdomen, and blood work including cultures. Per his wife, from whom all history is obtained due to patient's confusion, they left the ED in the morning and drove home to [place]. He had a low grade fever but was acting at his baseline without cough, dyspnea, chest pain or other complaints, when they received a call from the ED reporting a positive blood culture. They were referred to the ED where work up showed WBC 18.2 and CXR demonstrated multiple infiltrates concerning for HCAP. Vancomycin and zosyn were administered and he was admitted 6/3.  Assessment & Plan: Principal Problem:   Bacteremia Active Problems:   Heart transplanted ([place])   Hypothyroidism   ESRD on dialysis ([place])   Barrett's esophagus   Anemia of chronic disease   Depression   Controlled diabetes mellitus type 2 with complications ([place])   Uncontrolled hypertension  Streptococcal bacteremia: Per family report, cultures drawn at [facility] ED were said to be positive for streptococcal bacteria within 12 hours. Wife not sure how many bottles. In patient on immunosuppressants for heart transplant. +Leukocytosis without other SIRS.  - Obtain medical records from medical records from [facility] in [place], [place]. Care  Everywhere query unavailable. - On broad spectrum abx to include coverage for HCAP (infiltrates on CXR, vanc/zosyn). +MRSA PCR swab. - Follow up blood cultures drawn at admission here, NGTD.  - Echocardiogram ordered - Appreciate ID assistance.  ESRD: TTS schedule. Schedule thrown off by recent vacation: Last got HD 6/1, planned to return 6/4, then back to TTS.  - Routine HD 6/4. - Continue phoslo  History of heart transplant 2001: [facility], [place], [place].  - Continue cyclosporine and cellcept  Controlled T2DM: Diet-controlled, HbA1c 5.5% in 2017. Initially hyperglycemic likely due to infection. - SSI.   Hypertension with severe range HTN:  - Continue metoprolol 50mg  BID. Significant improvement with HD. - Started hydralazine q8h for BP control. [name] adamant that norvasc should not be given due to reaction (severe leg swelling), though this was explained to be likely side effect, not allergy.   Forearm wounds:  - WOC consulted - Plan dermatology follow up for consideration of biopsy.   Upper lip wound: With eschar. No surrounding erythema.  - Consider biopsy if not healing, as outpatient.  Hypothyroidism: Last TSH 1.4 - Cotn[place]ue levothyroxine 75 g daily    Barrett's esophagus and ?dysphagia: RN noted pausing prior to swallow.  - Would appreciate SLP evaluation. - Continue PO PPI  Anemia of chronic disease: - Monitor  Depression and dementia: - Continue fluoxetine and aricept - Wife reports about 1 year of increasing dependence on her for ADL's, general mental decline.     DVT prophylaxis: Lovenox Code Status: Full Family Communication: Called wife, LVM.  Disposition Plan: Uncertain at this time. PT  consulted. Has required SNF [facility]) at recent discharge for UTI. Wife would prefer [facility] if this is deemed appropriate as it is closer to her home.  Consultants:   ID  Nephrology  Procedures:  Echocardiogram 04/25/2016 Study Conclusions -  Left ventricle: The cavity size was normal. Wall thickness was increased in a pattern of mild LVH. Systolic function was normal. The estimated ejection fraction was in the range of 60% to 65%. Wall motion was normal; there were no regional wall motion abnormalities. Left ventricular diastolic function parameters were normal. - Left atrium: The atrium was moderately dilated. - Atrial septum: No defect or patent foramen ovale was identified  Antimicrobials:  Vancomycin 6/3 >>  Zosyn 6/3 >>  Subjective: Pt without complaints this AM at HD. Knows he's in HD only.   Objective: Vitals:   04/23/17 1130 04/23/17 1158 04/23/17 1239 04/23/17 1300  BP: (!) 109/57 123/63 (!) 131/57 (!) 122/57  Pulse: 77 75 74 76  Resp:  20 19   Temp:  97.7 F (36.5 C) 97.4 F (36.3 C)   TempSrc:  Oral Oral   SpO2:  100% 100%   Weight:  63.2 kg (139 lb 5.3 oz)    Height:       General exam: 76 y.o. male in no distress receiving HD, tired-appearing Respiratory system: Non-labored breathing room air. Clear to auscultation bilaterally.  Cardiovascular system: Regular rate and rhythm. No murmur, rub, or gallop. No JVD, and no pedal edema. Gastrointestinal system: Abdomen soft, non-tender, non-distended, with normoactive bowel sounds. No organomegaly or masses felt. Central nervous system: Alert and oriented. No focal neurological deficits. Extremities: Warm, no deformities. LUE AVF +thrill.  Skin: Thin skin throughout with avulsion on left dorsal forearm and raised wound on right dorsal forearm without purulence. Upper lip with raised wound w/eschar without surrounding erythema.   Psychiatry: Judgement and insight appear impaired. Mood & affect appropriate.   CBC:  Recent Labs Lab 04/22/17 0145 04/23/17 0624  WBC 18.2* 18.8*  NEUTROABS 16.3* 15.8*  HGB 11.0* 10.3*  HCT 34.6* 32.9*  MCV 96.6 95.6  PLT 203 263   Basic Metabolic Panel:  Recent Labs Lab 04/22/17 0145 04/23/17 0624  NA  134* 133*  K 5.1 5.6*  CL 88* 91*  CO2 30 25  GLUCOSE 189* 94  BUN 57* 86*  CREATININE 6.25* 7.43*  CALCIUM 8.6* 8.0*  PHOS  --  5.2*   GFR: Estimated Creatinine Clearance: 7.6 mL/min (A) (by C-G formula based on SCr of 7.43 mg/dL (H)). Liver Function Tests:  Recent Labs Lab 04/22/17 0145 04/23/17 0624  AST 23  --   ALT 21  --   ALKPHOS 115  --   BILITOT 0.5  --   PROT 6.3*  --   ALBUMIN 3.4* 2.6*    Recent Results (from the past 240 hour(s))  MRSA PCR Screening     Status: Abnormal   Collection Time: 04/22/17  6:37 AM  Result Value Ref Range Status   MRSA by PCR POSITIVE (A) NEGATIVE Final    Comment:        The GeneXpert MRSA Assay (FDA approved for NASAL specimens only), is one component of a comprehensive MRSA colonization surveillance program. It is not intended to diagnose MRSA infection nor to guide or monitor treatment for MRSA infections. RESULT CALLED TO, READ BACK BY AND VERIFIED WITH: C.HOLDSON RN AT 0800 04/22/17 BY A.DAVIS      Radiology Studies: Dg Chest 2 View  Result Date: 04/22/2017  CLINICAL DATA:  Rash, positive blood cultures EXAM: CHEST  2 VIEW COMPARISON:  02/04/2017, 11/09/2016 FINDINGS: Post sternotomy changes. Ill-defined opacities in the peripheral right upper lobe, right base and left mid lung. No pleural effusion. Mild cardiomegaly with aortic atherosclerosis. No pneumothorax. Vascular stent in the left axilla. IMPRESSION: 1. Ill-defined multifocal opacity in the right upper lobe, right base and left upper lobe suspicious for infiltrates. 2. Cardiomegaly without edema. Electronically Signed   By: Donavan Foil M.D.   On: 04/22/2017 02:07   Time spent: 25 minutes.  Vance Gather, MD [facility] Pager [phone]  If 7PM-7AM, please contact night-coverage [email or web address] Password [email or web address] 04/23/2017, 1:14 PM

## 2017-04-23 NOTE — Procedures (Signed)
I was present at this dialysis session. I have reviewed the session itself and made appropriate changes.   On 2K 2.5Ca bath UF goal 2L.  Using AVF 15g.  RCID note reviewed on Vanc/Zosyn.  Follow BP with HD.  No acute issues.  Next HD tentatively 6/6. RS  Filed Weights   04/22/17 0100 04/22/17 2236  Weight: 65.8 kg (145 lb) 65.8 kg (145 lb 1.6 oz)     Recent Labs Lab 04/23/17 0624  NA 133*  K 5.6*  CL 91*  CO2 25  GLUCOSE 94  BUN 86*  CREATININE 7.43*  CALCIUM 8.0*  PHOS 5.2*     Recent Labs Lab 04/22/17 0145 04/23/17 0624  WBC 18.2* 18.8*  NEUTROABS 16.3* 15.8*  HGB 11.0* 10.3*  HCT 34.6* 32.9*  MCV 96.6 95.6  PLT 203 166    Scheduled Meds: . acidophilus  1 capsule Oral Daily  . aspirin EC  81 mg Oral QHS  . calcium acetate  667 mg Oral TID WC  . Chlorhexidine Gluconate Cloth  6 each Topical Q0600  . cholecalciferol  1,000 Units Oral Daily  . cycloSPORINE modified  50 mg Oral QHS   And  . cycloSPORINE modified  75 mg Oral Daily  . donepezil  10 mg Oral QHS  . enoxaparin (LOVENOX) injection  30 mg Subcutaneous Q24H  . FLUoxetine  20 mg Oral Daily  . hydrALAZINE  25 mg Oral Q6H  . insulin aspart  0-9 Units Subcutaneous TID WC  . levothyroxine  75 mcg Oral QAC breakfast  . metoprolol tartrate  50 mg Oral BID  . multivitamin  1 tablet Oral QHS  . mupirocin ointment  1 application Nasal BID  . mycophenolate  500 mg Oral Daily   And  . mycophenolate  250 mg Oral Once per day on Sun Mon Wed Fri   And  . [START ON 04/24/2017] mycophenolate  500 mg Oral Once per day on Tue Thu Sat  . pantoprazole  40 mg Oral Daily  . pravastatin  10 mg Oral QHS  . vitamin B-12  1,000 mcg Oral Q breakfast   Continuous Infusions: . sodium chloride    . sodium chloride    . piperacillin-tazobactam (ZOSYN)  IV Stopped (04/23/17 0540)  . [START ON 04/24/2017] vancomycin     PRN Meds:.sodium chloride, sodium chloride, acetaminophen, alteplase, diphenoxylate-atropine, fluticasone,  heparin, hydrALAZINE, lidocaine (PF), lidocaine-prilocaine, ondansetron, pentafluoroprop-tetrafluoroeth   Pearson Grippe  MD 04/23/2017, 8:12 AM

## 2017-04-23 NOTE — Progress Notes (Signed)
Pharmacy Antibiotic Note  [name] [name] is a 76 y.o. male admitted on 04/21/2017 with bacteremia.  Per chart review, patient presented to ED in [place] 6/2 for skin tear that happened earlier that day, and was discharged on Augmentin and mupirocin. He was then called today by that ED telling him that his blood cultures were positive for strep and to go back to ED if IV antibiotics. Pharmacy has been consulted for vancomycin and zosyn dosing.   Patient is ESRD on HD TTS. He was loaded appropriately with vancomycin 1500mg  IV x1 yesterday.  Patient getting an extra HD session off schedule this morning.  Plan: Vancomycin 750mg  IV x1 today after HD, then resume 750mg  IV qHD- TTSat Zosyn 3.375g IV q12h F/U HD schedule/tolerance, clinical progression, LOT F/U OSH BCx to narrow  Height: 5\' 10"  (177.8 cm) Weight: 145 lb 1.6 oz (65.8 kg) IBW/kg (Calculated) : 73  Temp (24hrs), Avg:98.9 F (37.2 C), Min:98.2 F (36.8 C), Max:100.2 F (37.9 C)   Recent Labs Lab 04/22/17 0122 04/22/17 0145 04/23/17 0624  WBC  --  18.2* 18.8*  CREATININE  --  6.25* 7.43*  LATICACIDVEN 1.8  --   --     Estimated Creatinine Clearance: 7.9 mL/min (A) (by C-G formula based on SCr of 7.43 mg/dL (H)).    Allergies  Allergen Reactions  . Norvasc [Amlodipine Besylate] Swelling and Other (See Comments)    Swelling of the lower body     Antimicrobials this admission: 6/3 vanc >>  6/3 zosyn >>   Dose adjustments this admission: 6/4- extra HD session, given vancomycin 750mg  IV x1 post-HD  Microbiology results: 6/2 OSH BCx: ?strep  6/3 BCx: sent 6/3 MRSA PCR: positive   Thank you for allowing pharmacy to be a part of this patient's care.  Adelie Croswell D. Takita Riecke, PharmD, BCPS Clinical Pharmacist Pager: [phone] Clinical Phone for 04/23/2017 until 3:30pm: [phone] If after 3:30pm, please call main pharmacy at [phone] 04/23/2017 8:22 AM

## 2017-04-24 ENCOUNTER — Other Ambulatory Visit (HOSPITAL_COMMUNITY): Payer: Medicare HMO

## 2017-04-24 DIAGNOSIS — D72829 Elevated white blood cell count, unspecified: Secondary | ICD-10-CM

## 2017-04-24 DIAGNOSIS — R21 Rash and other nonspecific skin eruption: Secondary | ICD-10-CM

## 2017-04-24 LAB — BASIC METABOLIC PANEL
Anion gap: 19 — ABNORMAL HIGH (ref 5–15)
BUN: 42 mg/dL — ABNORMAL HIGH (ref 6–20)
BUN: 42 — AB (ref 4–21)
CALCIUM: 8.2 mg/dL — AB (ref 8.9–10.3)
CO2: 27 mmol/L (ref 22–32)
CREATININE: 4.5 — AB (ref 0.6–1.3)
CREATININE: 4.47 mg/dL — AB (ref 0.61–1.24)
Chloride: 89 mmol/L — ABNORMAL LOW (ref 101–111)
GFR calc Af Amer: 13 mL/min — ABNORMAL LOW (ref >60)
GFR calc non Af Amer: 12 mL/min — ABNORMAL LOW (ref >60)
GLUCOSE: 136
GLUCOSE: 136 mg/dL — AB (ref 65–99)
Potassium: 4.2 (ref 3.4–5.3)
Potassium: 4.2 mmol/L (ref 3.5–5.1)
Sodium: 135 mmol/L (ref 135–145)
Sodium: 135 — AB (ref 137–147)

## 2017-04-24 LAB — CBC WITH DIFFERENTIAL/PLATELET
Basophils Absolute: 0 10*3/uL (ref 0.0–0.1)
Basophils Relative: 0 %
Eosinophils Absolute: 0 10*3/uL (ref 0.0–0.7)
Eosinophils Relative: 0 %
HEMATOCRIT: 33.3 % — AB (ref 39.0–52.0)
Hemoglobin: 10.7 g/dL — ABNORMAL LOW (ref 13.0–17.0)
LYMPHS ABS: 1.9 10*3/uL (ref 0.7–4.0)
LYMPHS PCT: 14 %
MCH: 31 pg (ref 26.0–34.0)
MCHC: 32.1 g/dL (ref 30.0–36.0)
MCV: 96.5 fL (ref 78.0–100.0)
MONO ABS: 0.9 10*3/uL (ref 0.1–1.0)
MONOS PCT: 7 %
NEUTROS ABS: 10.5 10*3/uL — AB (ref 1.7–7.7)
Neutrophils Relative %: 79 %
Platelets: 195 10*3/uL (ref 150–400)
RBC: 3.45 MIL/uL — ABNORMAL LOW (ref 4.22–5.81)
RDW: 15.7 % — AB (ref 11.5–15.5)
WBC: 13.4 10*3/uL — ABNORMAL HIGH (ref 4.0–10.5)

## 2017-04-24 LAB — CBC AND DIFFERENTIAL
HCT: 33 — AB (ref 41–53)
Hemoglobin: 10.7 — AB (ref 13.5–17.5)
PLATELETS: 195 (ref 150–399)
WBC: 13.4

## 2017-04-24 LAB — GLUCOSE, CAPILLARY
GLUCOSE-CAPILLARY: 102 mg/dL — AB (ref 65–99)
Glucose-Capillary: 120 mg/dL — ABNORMAL HIGH (ref 65–99)
Glucose-Capillary: 200 mg/dL — ABNORMAL HIGH (ref 65–99)
Glucose-Capillary: 163 mg/dL — ABNORMAL HIGH (ref 65–99)

## 2017-04-24 MED ORDER — SODIUM CHLORIDE 0.9 % IV SOLN
3.0000 g | INTRAVENOUS | Status: DC
  Administered 2017-04-25: 3 g via INTRAVENOUS
  Filled 2017-04-24 (×3): qty 3

## 2017-04-24 NOTE — Progress Notes (Signed)
Initial Nutrition Assessment  DOCUMENTATION CODES:   Non-severe (moderate) malnutrition in context of chronic illness  INTERVENTION:   -Continue Nepro Shake po BID, each supplement provides 425 kcal and 19 grams protein -If po intake remains inadequate, recommend liberalizing diet -Acupuncturist Cup at [employer] and [employer] -Continue Rena-Vit  NUTRITION DIAGNOSIS:   Malnutrition (Moderate) related to chronic illness (ESRD on HD, dementia, heart transplant) as evidenced by severe depletion of muscle mass, moderate depletion of body fat.  GOAL:   Patient will meet greater than or equal to 90% of their needs  MONITOR:   PO intake, Supplement acceptance, Labs, Weight trends  REASON FOR ASSESSMENT:   Malnutrition Screening Tool    ASSESSMENT:    76 yo male admitted with bateremia. Pt with hx of heart transplant (2001), ESRD on HD, DM, HTN, HL, dementia, Barrett's esophagus/dysphagia  Pt ate 1 bite of tuna sandwich and bites of canned peaches at lunch today. Pt reported he was finished with meal tray and was full. Recorded po intake 26% of meals on average. Pt reports he typically eats 3 meals per day at home that are prepared by his wife (wife not present on visit today). Pt reports he drinks Nepro shakes at dialysis but no nutritional supplements at home.  Pt also reports stable weight. Per weight encounters, wt has been trending down. Noted pt with 2.1% wt loss since March; 11.2% wt loss since September (not significant for time frame but significant considering the chronic trend). Noted per MD note, pt does not have high weight gains between treatments and usually gets to dry weight. Dry wt of 64.5 kg (noted pt below EDW at present)  Wt Readings from Last 10 Encounters:  04/23/17 135 lb 3.2 oz (61.3 kg)  02/06/17 138 lb 3.7 oz (62.7 kg)  11/09/16 145 lb (65.8 kg)  09/06/16 145 lb (65.8 kg)  09/02/16 145 lb (65.8 kg)  08/24/16 146 lb 9.7 oz (66.5 kg)  08/09/16 152 lb 12.5 oz (69.3 kg)   05/09/16 158 lb 15.2 oz (72.1 kg)  02/24/16 145 lb (65.8 kg)  07/30/15 145 lb (65.8 kg)   Nutrition-Focused physical exam completed. Findings are mild/moderate fat depletion, mild/moderate to severe muscle depletion, and no edema.   Labs: phosphorus 5.2 (acceptable for HD) Meds: acidophilus, phoslo, rena-vit  Diet Order:  Diet renal/carb modified with fluid restriction Diet-HS Snack? Nothing; Room service appropriate? Yes; Fluid consistency: Thin  Skin:  Reviewed, no issues  Last BM:  no documented BM  Height:   Ht Readings from Last 1 Encounters:  04/22/17 5\' 10"  (1.778 m)    Weight:   Wt Readings from Last 1 Encounters:  04/23/17 135 lb 3.2 oz (61.3 kg)    Ideal Body Weight:     BMI:  Body mass index is 19.4 kg/m.  Estimated Nutritional Needs:   Kcal:  1760-2030 kcals  Protein:  88-102 g  Fluid:  1000 mL plus UOP  EDUCATION NEEDS:   No education needs identified at this time  [place], [place], LDN [phone] Pager  [phone] Weekend/On-Call Pager

## 2017-04-24 NOTE — Progress Notes (Signed)
PROGRESS NOTE  [name] [name]  MGQ:[id number] DOB: 1941/06/15 DOA: 04/21/2017 PCP: Junie Panning, NP   Brief Narrative: [name] [name] is a 76 y.o. male with a history of heart transplant, ESRD, CAD, T2DM, HTN, HLD, and dementia who presented to the ED due to report of positive blood cultures. While visiting [place], [place] last week, he had been evaluated at urgent care for a left arm wound on 6/1. He was prescribed augmentin, and topical mupirocin ointment and sent home. That night he became nonverbal, not able to follow commands, so he was taken to [facility] in [place], [place] in the very early morning hours of 6/2. Work up there included CT head, imaging of his abdomen, and blood work including cultures. Per his wife, from whom all history is obtained due to patient's confusion, they left the ED in the morning and drove home to [place]. He had a low grade fever but was acting at his baseline without cough, dyspnea, chest pain or other complaints, when they received a call from the ED reporting a positive blood culture. They were referred to the ED where work up showed WBC 18.2 and CXR demonstrated multiple infiltrates concerning for HCAP. Vancomycin and zosyn were administered and he was admitted 6/3. ID was consulted and after blood cultures remained negative on 6/5, antibiotics were changed to unasyn. If he remains afebrile with continued negative blood cultures, he will be discharged 6/6 to SNF on oral antibiotics.   Assessment & Plan: Principal Problem:   Bacteremia Active Problems:   Heart transplanted ([place])   Hypothyroidism   ESRD on dialysis ([place])   Barrett's esophagus   Anemia of chronic disease   Depression   Controlled diabetes mellitus type 2 with complications ([place])   Uncontrolled hypertension  Presumed streptococcal bacteremia: Per family report, cultures drawn at [facility] ED were said to be positive for streptococcal bacteria within 12  hours. Wife not sure how many bottles. In patient on immunosuppressants for heart transplant. +Leukocytosis without other SIRS.  - Obtaining medical records (particularly culture data) from medical records from [facility] in [place], [place]. Care Everywhere query unavailable. - Vanc/zosyn > unasyn per ID recommendations. If afebrile (mentation appears to be improving with treatment) and cultures remain negative, plan to DC on augmentin for 5 more days starting 6/6.  - Follow up blood cultures drawn at admission here, NGTD.  - Echocardiogram ordered  ESRD: TTS schedule. Schedule thrown off by recent vacation: Last got HD 6/1, planned to return 6/4, then back to TTS.  - Routine HD 6/4, to get back on schedule 6/5 - Continue phoslo   History of heart transplant 2001: [facility], [place], [place].  - Continue cyclosporine and cellcept  Controlled T2DM: Diet-controlled, HbA1c 5.5% in 2017. Initially hyperglycemic likely due to infection. - SSI. At inpatient goal.  Hypertension with severe range HTN:  - Continue metoprolol 50mg  BID. Significant improvement with HD. - Started hydralazine q8h for BP control. [name] adamant that norvasc should not be given due to reaction (severe leg swelling), though this was explained to be likely side effect, not allergy.   Right dorsal forearm wound:  - Plan dermatology follow up for consideration of biopsy.   Upper lip wound: Present x3 months per pt. No surrounding erythema.  - Will require outpatient biopsy.   Hypothyroidism: Last TSH 1.4 - Continue levothyroxine 75 g daily    Barrett's esophagus: RN noted pausing prior to swallow. No dysphagia per SLP - Continue PO  PPI  Anemia of chronic disease: - Monitor  Depression and dementia: - Continue fluoxetine and aricept - Wife reports about 1 year of increasing dependence on her for ADL's, general mental decline.     DVT prophylaxis: Lovenox Code Status: Full Family  Communication: Called wife. No answer, but per report she is hospitalized due to MI.  Disposition Plan: Anticipate DC to SNF per PT recommendations, lost likely 6/6 on oral antibiotics.  Consultants:   ID  Nephrology  Procedures:  Echocardiogram 04/25/2016 Study Conclusions - Left ventricle: The cavity size was normal. Wall thickness was increased in a pattern of mild LVH. Systolic function was normal. The estimated ejection fraction was in the range of 60% to 65%. Wall motion was normal; there were no regional wall motion abnormalities. Left ventricular diastolic function parameters were normal. - Left atrium: The atrium was moderately dilated. - Atrial septum: No defect or patent foramen ovale was identified  Antimicrobials:  Vancomycin 6/3 >> 6/5  Zosyn 6/3 >> 6/5  Unasyn 6/5 >>   Subjective: Pt more alert, no complaints. Denies fever, chest pain, dyspnea.   Objective: Vitals:   04/23/17 1726 04/23/17 2240 04/24/17 0526 04/24/17 0936  BP: (!) 118/57 117/65 (!) 144/62 126/62  Pulse: 69 75 72 77  Resp: 18 18 18 18   Temp: 98.2 F (36.8 C) 98.7 F (37.1 C) 98.4 F (36.9 C) 98.5 F (36.9 C)  TempSrc: Oral Oral Oral Oral  SpO2: 98% 98% 98% 100%  Weight:  61.3 kg (135 lb 3.2 oz)    Height:       General exam: 76 y.o. male in no distress, more alert today. Respiratory system: Non-labored breathing room air. Clear to auscultation bilaterally.  Cardiovascular system: Regular rate and rhythm. No murmur, rub, or gallop. No JVD, and no pedal edema. Gastrointestinal system: Abdomen soft, non-tender, non-distended, with normoactive bowel sounds. No organomegaly or masses felt. Central nervous system: Alert and oriented this morning. No focal neurological deficits. Extremities: Warm, no deformities. LUE AVF +thrill.  Skin: Healed sternotomy scar. Thin skin throughout with scabbed left forearm avulsion without erythema or drainage. Upper lip with raised wound, eschar  unroofed with intact underlying skin. Irregular raised pink lesion on right dorsal forearm clean, dry.  Psychiatry: Judgement and insight appear impaired. Mood & affect appropriate.   CBC:  Recent Labs Lab 04/22/17 0145 04/23/17 0624 04/24/17 0539  WBC 18.2* 18.8* 13.4*  NEUTROABS 16.3* 15.8* 10.5*  HGB 11.0* 10.3* 10.7*  HCT 34.6* 32.9* 33.3*  MCV 96.6 95.6 96.5  PLT 203 166 034   Basic Metabolic Panel:  Recent Labs Lab 04/22/17 0145 04/23/17 0624 04/24/17 0539  NA 134* 133* 135  K 5.1 5.6* 4.2  CL 88* 91* 89*  CO2 30 25 27   GLUCOSE 189* 94 136*  BUN 57* 86* 42*  CREATININE 6.25* 7.43* 4.47*  CALCIUM 8.6* 8.0* 8.2*  PHOS  --  5.2*  --    GFR: Estimated Creatinine Clearance: 12.2 mL/min (A) (by C-G formula based on SCr of 4.47 mg/dL (H)). Liver Function Tests:  Recent Labs Lab 04/22/17 0145 04/23/17 0624  AST 23  --   ALT 21  --   ALKPHOS 115  --   BILITOT 0.5  --   PROT 6.3*  --   ALBUMIN 3.4* 2.6*    Recent Results (from the past 240 hour(s))  Blood culture (routine x 2)     Status: None (Preliminary result)   Collection Time: 04/22/17  1:45 AM  Result  Value Ref Range Status   Specimen Description BLOOD RIGHT ANTECUBITAL  Final   Special Requests   Final    BOTTLES DRAWN AEROBIC AND ANAEROBIC Blood Culture adequate volume   Culture NO GROWTH 1 DAY  Final   Report Status PENDING  Incomplete  Blood culture (routine x 2)     Status: None (Preliminary result)   Collection Time: 04/22/17  2:15 AM  Result Value Ref Range Status   Specimen Description BLOOD RIGHT ARM  Final   Special Requests   Final    BOTTLES DRAWN AEROBIC AND ANAEROBIC Blood Culture adequate volume   Culture NO GROWTH 1 DAY  Final   Report Status PENDING  Incomplete  MRSA PCR Screening     Status: Abnormal   Collection Time: 04/22/17  6:37 AM  Result Value Ref Range Status   MRSA by PCR POSITIVE (A) NEGATIVE Final    Comment:        The GeneXpert MRSA Assay (FDA approved for  NASAL specimens only), is one component of a comprehensive MRSA colonization surveillance program. It is not intended to diagnose MRSA infection nor to guide or monitor treatment for MRSA infections. RESULT CALLED TO, READ BACK BY AND VERIFIED WITH: C.HOLDSON RN AT 0800 04/22/17 BY A.DAVIS      Time spent: 25 minutes.  Vance Gather, MD [facility] Pager [phone]  If 7PM-7AM, please contact night-coverage [email or web address] Password [email or web address] 04/24/2017, 11:20 AM

## 2017-04-24 NOTE — Progress Notes (Signed)
Physical Therapy Treatment Patient Details Name: [name] [name] MRN: [id number] DOB: Jul 19, 1941 Today's Date: 04/24/2017    History of Present Illness Patient is a 76 yo male admitted 04/21/17 with bacteremia.  Patient traveling with wife in [place] and developed rash.  Patient with abnormal labs, and came to [facility] ED where he was admitted.    PMH:  ESRD on HD, CAD, CABG, heart transplant, DM, mild dementia, CVA, craniotomy for evac of SDH, orthostatic hypotension, anemia, HTN, HLD, HOH    PT Comments    Continuing work on functional mobility and activity tolerance;  Agreeable to getting OOB to chair today with some encouragement; still quite weak, requiring +2 total/Max assist for upright activity and transfers; continue to recommend SNF for post-acute rehabilitation   Follow Up Recommendations  SNF;Supervision/Assistance - 24 hour     Equipment Recommendations  None recommended by PT    Recommendations for Other Services       Precautions / Restrictions Precautions Precautions: Fall    Mobility  Bed Mobility Overal bed mobility: Needs Assistance Bed Mobility: Rolling;Sidelying to Sit Rolling: Mod assist Sidelying to sit: Max assist       General bed mobility comments: Cues and encouragement given used rail s to rool to his side; Max assist to elelvate trunk to sit; very unstable in sitting, and at one point he made a move to lie back down, but this therapist was able to convice him to get to the chair  Transfers Overall transfer level: Needs assistance Equipment used: Rolling walker (2 wheeled) Transfers: Sit to/from [facility] Sit to Stand: Max assist;+2 physical assistance Stand pivot transfers: Total assist;+2 physical assistance       General transfer comment: max assist to power up to stand, bil support at gait belt; very weak and unable to fully extend bil knees and hips, trunk flexed over RW; Total assist of 2 to shift hips and pivot to  recliner  Ambulation/Gait             General Gait Details: Unable   Stairs            Wheelchair Mobility    Modified Rankin (Stroke Patients Only)       Balance     Sitting balance-Leahy Scale: Poor Sitting balance - Comments: at least min assist to keep sitting balance; trunk weakness, tending to lose balance in any direction     Standing balance-Leahy Scale: Zero                              Cognition Arousal/Alertness: Awake/alert Behavior During Therapy: Flat affect Overall Cognitive Status: No family/caregiver present to determine baseline cognitive functioning                                 General Comments: Pleasantly confused; Able to follow commands with increased time.  Unable to provide accurate PLOF.      Exercises      General Comments General comments (skin integrity, edema, etc.): noted a scab on his L elbow with small crack and began bleeding; notified RN      Pertinent Vitals/Pain Pain Assessment: No/denies pain    Home Living                      Prior Function  PT Goals (current goals can now be found in the care plan section) Acute Rehab PT Goals Patient Stated Goal: Unable to state, but agreeable to getting OOB to chair PT Goal Formulation: Patient unable to participate in goal setting Time For Goal Achievement: 04/29/17 Potential to Achieve Goals: Fair Progress towards PT goals: Progressing toward goals    Frequency    Min 3X/week      PT Plan Current plan remains appropriate    Co-evaluation              AM-PAC PT "6 Clicks" Daily Activity  Outcome Measure  Difficulty turning over in bed (including adjusting bedclothes, sheets and blankets)?: Total Difficulty moving from lying on back to sitting on the side of the bed? : Total Difficulty sitting down on and standing up from a chair with arms (e.g., wheelchair, bedside commode, etc,.)?: Total Help needed  moving to and from a bed to chair (including a wheelchair)?: A Lot Help needed walking in hospital room?: A Lot Help needed climbing 3-5 steps with a railing? : Total 6 Click Score: 8    End of Session Equipment Utilized During Treatment: Gait belt Activity Tolerance: Patient tolerated treatment well;Patient limited by fatigue Patient left: in chair;with call bell/phone within reach;with chair alarm set Nurse Communication: Mobility status (skin tear L UE) PT Visit Diagnosis: Other abnormalities of gait and mobility (R26.89);Unsteadiness on feet (R26.81);Muscle weakness (generalized) (M62.81);Other symptoms and signs involving the nervous system (R29.898)     Time: 2836-6294 PT Time Calculation (min) (ACUTE ONLY): 17 min  Charges:  $Therapeutic Activity: 8-22 mins                    G Codes:       [name], PT  Acute Rehabilitation Services Pager [phone] Office [place] 04/24/2017, 12:45 PM

## 2017-04-24 NOTE — Progress Notes (Signed)
Dialysis treatment completed.  1500 mL ultrafiltrated and net fluid removal 1000 mL.    Patient status unchanged. Lung sounds diminished to ausculation in all fields. No edema. Cardiac: NSR.  Disconnected lines and removed needles.  Pressure held for 10 minutes and band aid/gauze dressing applied.  Report given to bedside RN, Maudie Mercury.

## 2017-04-24 NOTE — Progress Notes (Signed)
Patient arrived to unit per bed.  Reviewed treatment plan and this RN agrees.  Report received from bedside RN, Maudie Mercury.  Consent veriried.  Patient A & O to self, place only. Lung sounds diminished to ausculation in all fields. No edema. Cardiac: NSr.  Prepped LUAVF with alcohol and cannulated with two 15 gauge needles.  Pulsation of blood noted.  Flushed access well with saline per protocol.  Connected and secured lines and initiated tx at 1448.  UF goal of 1500 mL and net fluid removal of 1000 mL.  Will continue to monitor.

## 2017-04-24 NOTE — Progress Notes (Signed)
Gave update to Mrs. Leser on status of patient.  Notified Dr. Bonner Puna of Mrs. Meditz phone number and that she is requesting a call from the physician.  Sheliah Plane RN

## 2017-04-24 NOTE — NC FL2 (Signed)
[place] LEVEL OF CARE SCREENING TOOL     IDENTIFICATION  Patient Name: [name] [name] Birthdate: 07/03/1941 Sex: male Admission Date (Current Location): 04/21/2017  [facility] [place] and [place] Number:  Herbalist and Address:  [facility] [place], [place] [address], [place], [place] [address]      Provider Number: [id number]  Attending Physician Name and Address:  Patrecia Pour, MD  Relative Name and Phone Number:  [place]. [phone] (home), [phone] (cell);  Fogal,Chris - Son,  [phone] (home)     Current Level of Care: Hospital Recommended Level of Care: [place] Prior Approval Number:    Date Approved/Denied:   PASRR Number: [id number] (Eff. 10/09/14)  Discharge Plan: SNF    Current Diagnoses: Patient Active Problem List   Diagnosis Date Noted  . Bacteremia 04/22/2017  . Acute metabolic encephalopathy 56/43/3295  . Volume overload 02/03/2017  . Generalized weakness 08/22/2016  . Urinary tract infection   . Syncope and collapse   . Syncope 08/07/2016  . Faintness   . Uncontrolled hypertension   . Acute blood loss anemia   . Cardiomyopathy ([place])   . Controlled diabetes mellitus type 2 with complications ([place])   . 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 Traumatic subdural hematoma ([place]) 05/04/2016  . 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 Subdural hematoma ([place]) 05/02/2016  . CKD (chronic kidney disease)   . Protein-calorie malnutrition, severe ([place]) 04/23/2015  . Anemia of chronic disease 04/23/2015  . Depression 04/23/2015  . UTI (urinary tract infection) 04/22/2015  . Barrett's esophagus 03/22/2015  . ESRD on dialysis ([place]) 10/12/2014  . DM (diabetes mellitus), type 2 with renal complications ([place]) 18/84/1660  . Hypothyroidism 10/06/2014  . Heart transplanted ([place])   . Hypercholesterolemia 06/20/2014  . 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 Q3 days or PRN soiling), abrasion on lip)                       Personal Care Assistance Level of Assistance  Bathing, Feeding, Dressing Bathing Assistance: Maximum assistance Feeding assistance: Independent Dressing Assistance: Maximum assistance     Functional Limitations Info  Sight, Hearing, Speech Sight Info: Adequate Hearing Info: Impaired Speech Info: Adequate    SPECIAL CARE FACTORS FREQUENCY  PT (By licensed PT)     PT Frequency: Evaluated 6/3 and a minimum of 3X per day therapy recommended              Contractures Contractures Info: Not present    Additional Factors Info  Code Status, Allergies, Insulin Sliding Scale Code Status Info: Full Allergies Info: Norvasc   Insulin Sliding Scale Info: 0-9 Units 3X daily with meals       Current Medications (04/24/2017):  This is the current hospital active medication list Current Facility-Administered Medications  Medication Dose Route Frequency Provider Last Rate Last Dose  . acetaminophen (TYLENOL) tablet 650 mg  650 mg Oral  Q6H PRN Reubin Milan, MD      . acidophilus (RISAQUAD) capsule 1 capsule  1 capsule Oral Daily Patrecia Pour, MD   1 capsule at 04/24/17 1105  . Ampicillin-Sulbactam (UNASYN) 3 g in sodium chloride 0.9 % 100 mL IVPB  3 g Intravenous Q24H Bajbus, Lauren D, RPH      . aspirin EC tablet 81 mg  81 mg Oral QHS Reubin Milan, MD   81 mg at 04/22/17 2202  . calcium  acetate (PHOSLO) capsule 667 mg  667 mg Oral TID WC Reubin Milan, MD   667 mg at 04/24/17 [phone]  . Chlorhexidine Gluconate Cloth 2 % PADS 6 each  6 each Topical Q0600 Patrecia Pour, MD   6 each at 04/24/17 0539  . cholecalciferol (VITAMIN D) tablet 1,000 Units  1,000 Units Oral Daily Reubin Milan, MD   1,000 Units at 04/24/17 1105  . cycloSPORINE modified (NEORAL) capsule 50 mg  50 mg Oral QHS Patrecia Pour, MD   50 mg at 04/22/17 2202   And  . cycloSPORINE modified (NEORAL) capsule 75 mg  75 mg Oral Daily Patrecia Pour, MD   75 mg at 04/24/17 1105  . diphenoxylate-atropine (LOMOTIL) 2.5-0.025 MG per tablet 1 tablet  1 tablet Oral QID PRN Reubin Milan, MD      . donepezil (ARICEPT) tablet 10 mg  10 mg Oral QHS Reubin Milan, MD   10 mg at 04/22/17 2202  . feeding supplement (NEPRO CARB STEADY) liquid 237 mL  237 mL Oral BID BM Patrecia Pour, MD   237 mL at 04/23/17 1510  . FLUoxetine (PROZAC) capsule 20 mg  20 mg Oral Daily Reubin Milan, MD   20 mg at 04/24/17 1104  . fluticasone (FLONASE) 50 MCG/ACT nasal spray 2 spray  2 spray Each Nare QHS PRN Reubin Milan, MD      . heparin injection 5,000 Units  5,000 Units Subcutaneous Q8H Vance Gather B, MD      . hydrALAZINE (APRESOLINE) injection 10 mg  10 mg Intravenous Q4H PRN Patrecia Pour, MD      . hydrALAZINE (APRESOLINE) tablet 25 mg  25 mg Oral Q6H Patrecia Pour, MD   25 mg at 04/23/17 1755  . insulin aspart (novoLOG) injection 0-9 Units  0-9 Units Subcutaneous TID WC Reubin Milan, MD   1 Units at 04/23/17 1755  . levothyroxine (SYNTHROID, LEVOTHROID) tablet 75 mcg  75 mcg Oral QAC breakfast Reubin Milan, MD   75 mcg at 04/24/17 [phone]  . metoprolol tartrate (LOPRESSOR) tablet 50 mg  50 mg Oral BID Reubin Milan, MD   50 mg at 04/24/17 1105  . morphine 2 MG/ML injection 1 mg  1 mg Intravenous Q3H PRN Gardiner Barefoot, NP   1 mg at 04/23/17 2326  . multivitamin (RENA-VIT) tablet 1 tablet   1 tablet Oral QHS Reubin Milan, MD   1 tablet at 04/22/17 2202  . mupirocin ointment (BACTROBAN) 2 % 1 application  1 application Nasal BID Patrecia Pour, MD   1 application at 63/01/60 1108  . mycophenolate (CELLCEPT) capsule 500 mg  500 mg Oral Daily Vance Gather B, MD   500 mg at 04/24/17 1104   And  . mycophenolate (CELLCEPT) capsule 250 mg  250 mg Oral Once per day on Sun Mon Wed Fri Patrecia Pour, MD   250 mg at 04/22/17 2203  And  . mycophenolate (CELLCEPT) capsule 500 mg  500 mg Oral Once per day on Tue Thu Sat Vance Gather B, MD      . ondansetron [facility]) tablet 8 mg  8 mg Oral Q8H PRN Patrecia Pour, MD   8 mg at 04/22/17 1309  . pantoprazole (PROTONIX) EC tablet 40 mg  40 mg Oral Daily Reubin Milan, MD   40 mg at 04/24/17 1104  . pravastatin (PRAVACHOL) tablet 10 mg  10 mg Oral QHS Reubin Milan, MD   10 mg at 04/22/17 2202  . vitamin B-12 (CYANOCOBALAMIN) tablet 1,000 mcg  1,000 mcg Oral Q breakfast Reubin Milan, MD   1,000 mcg at 04/24/17 1886     Discharge Medications: Please see discharge summary for a list of discharge medications.  Relevant Imaging Results:  Relevant Lab Results:   Additional Information [phone]. Dialysis patient TTS at [facility]. On contact for MRSA, ESBL  Emonie Espericueta, Mila Homer, LCSW

## 2017-04-24 NOTE — Clinical Social Work Note (Signed)
Received consult for skilled nursing facility placement and per MD, patient should be ready for discharge on Wed., 6/6. Assessment completed with wife, [name] (patient at [facility] also) and full assessment to follow. Facility chosen is [facility]. Authorization initiated with [employer].  Eleny Cortez Givens, MSW, LCSW Licensed Clinical Social Worker [place] [phone]

## 2017-04-24 NOTE — Progress Notes (Signed)
    [place] for Infectious Disease   Reason for visit: Follow up on presumed bacteremia  Interval History: feels well today, no fever, WBC improved to 13, blood cultures here have remained no growth to date.  No associated n/v/d. No sob, no cough, no hypoxia.    Physical Exam: Constitutional:  Vitals:   04/24/17 0526 04/24/17 0936  BP: (!) 144/62 126/62  Pulse: 72 77  Resp: 18 18  Temp: 98.4 F (36.9 C) 98.5 F (36.9 C)   patient appears in NAD Eyes: anicteric Respiratory: Normal respiratory effort; CTA B Cardiovascular: RRR  Review of Systems: Constitutional: negative for fevers, chills and anorexia Respiratory: negative for cough or sputum Integument/breast: negative for rash  Lab Results  Component Value Date   WBC 13.4 (H) 04/24/2017   HGB 10.7 (L) 04/24/2017   HCT 33.3 (L) 04/24/2017   MCV 96.5 04/24/2017   PLT 195 04/24/2017    Lab Results  Component Value Date   CREATININE 4.47 (H) 04/24/2017   BUN 42 (H) 04/24/2017   NA 135 04/24/2017   K 4.2 04/24/2017   CL 89 (L) 04/24/2017   CO2 27 04/24/2017    Lab Results  Component Value Date   ALT 21 04/22/2017   AST 23 04/22/2017   ALKPHOS 115 04/22/2017     Microbiology: Recent Results (from the past 240 hour(s))  Blood culture (routine x 2)     Status: None (Preliminary result)   Collection Time: 04/22/17  1:45 AM  Result Value Ref Range Status   Specimen Description BLOOD RIGHT ANTECUBITAL  Final   Special Requests   Final    BOTTLES DRAWN AEROBIC AND ANAEROBIC Blood Culture adequate volume   Culture NO GROWTH 1 DAY  Final   Report Status PENDING  Incomplete  Blood culture (routine x 2)     Status: None (Preliminary result)   Collection Time: 04/22/17  2:15 AM  Result Value Ref Range Status   Specimen Description BLOOD RIGHT ARM  Final   Special Requests   Final    BOTTLES DRAWN AEROBIC AND ANAEROBIC Blood Culture adequate volume   Culture NO GROWTH 1 DAY  Final   Report Status PENDING   Incomplete  MRSA PCR Screening     Status: Abnormal   Collection Time: 04/22/17  6:37 AM  Result Value Ref Range Status   MRSA by PCR POSITIVE (A) NEGATIVE Final    Comment:        The GeneXpert MRSA Assay (FDA approved for NASAL specimens only), is one component of a comprehensive MRSA colonization surveillance program. It is not intended to diagnose MRSA infection nor to guide or monitor treatment for MRSA infections. RESULT CALLED TO, READ BACK BY AND VERIFIED WITH: C.HOLDSON RN AT 0800 04/22/17 BY A.DAVIS     Impression/Plan:  1. Presumed Strep bacteremia - no obvious source, may be lungs but doing well now.  No growth here, WBC has improved.   Will change to unasyn and if doing ok tomorrow and blood cultures remain negative, ok to send out on [place] for 5 more days.    2.  Rash - to see dermatology after discharge.

## 2017-04-24 NOTE — Progress Notes (Signed)
Pharmacy Antibiotic Note  [name] [name] is a 76 y.o. male admitted on 04/21/2017 with bacteremia.  Per chart review, patient presented to ED in [place] 6/2 for skin tear that happened earlier that day, and was discharged on Augmentin and mupirocin. He was then called today by that ED telling him that his blood cultures were positive for strep and to go back to ED if IV antibiotics. Pharmacy has been consulted for Unasyn dosing.   Patient is ESRD on HD TTS. He had extra HD yesterday off schedule, and is to return to TTS schedule today. ID recommends narrowing to Unasyn, and if patient is stable tomorrow to discharge on Augmentin.  Plan: Unasyn 3g IV qhs If patient switched to Augmentin, ESRD dosing is Augmentin 500/125mg  qhs  Height: 5\' 10"  (177.8 cm) Weight: 135 lb 3.2 oz (61.3 kg) IBW/kg (Calculated) : 73  Temp (24hrs), Avg:98.2 F (36.8 C), Min:97.4 F (36.3 C), Max:98.7 F (37.1 C)   Recent Labs Lab 04/22/17 0122 04/22/17 0145 04/23/17 0624 04/24/17 0539  WBC  --  18.2* 18.8* 13.4*  CREATININE  --  6.25* 7.43* 4.47*  LATICACIDVEN 1.8  --   --   --     Estimated Creatinine Clearance: 12.2 mL/min (A) (by C-G formula based on SCr of 4.47 mg/dL (H)).    Allergies  Allergen Reactions  . Norvasc [Amlodipine Besylate] Swelling and Other (See Comments)    Swelling of the lower body     Antimicrobials this admission: 6/3 vanc >> 6/5 6/3 zosyn >> 6/5 6/5 Unasyn >>   Dose adjustments this admission: 6/4- extra HD session, given vancomycin 750mg  IV x1 post-HD  Microbiology results: 6/2 OSH BCx: ?strep  6/3 BCx: ngtd 6/3 MRSA PCR: positive   Thank you for allowing pharmacy to be a part of this patient's care.  Carrie Schoonmaker D. Vanderbilt Ranieri, PharmD, BCPS Clinical Pharmacist Pager: [phone] Clinical Phone for 04/24/2017 until 3:30pm: [phone] If after 3:30pm, please call main pharmacy at [phone] 04/24/2017 11:32 AM

## 2017-04-24 NOTE — Progress Notes (Signed)
1. Subjective:   tolerated HD yest./"what am I doing here again "  Objective Vital signs in last 24 hours: Vitals:   04/23/17 1300 04/23/17 1726 04/23/17 2240 04/24/17 0526  BP: (!) 122/57 (!) 118/57 117/65 (!) 144/62  Pulse: 76 69 75 72  Resp:  18 18 18   Temp:  98.2 F (36.8 C) 98.7 F (37.1 C) 98.4 F (36.9 C)  TempSrc:  Oral Oral Oral  SpO2:  98% 98% 98%  Weight:   61.3 kg (135 lb 3.2 oz)   Height:       Weight change: -0.317 kg (-11.2 oz)  Physical Exam: General: alert Pleasantly confused elderly WM, thinks today is Mon 18th , NAD Heart: RRR no mur, rub Lungs: Clear bilaterally to auscultation without wheezes, rales, or rhonchi. Breathing is unlabored. Slightly decreased in bases. . Abdomen: Soft, non-tender, non-distended with normoactive bowel sounds.  Lower extremities:without edema  Dialysis Access: LFA AVF + bruit.  Dialysis Orders: [place] T, Th,S 4 hrs 2.0 K/2.25 Ca 64.5 kg  400/Auto 1.5 -No Heparin  -Venofer 50mg  IV weekly (Last dose 04/12/17 Fe 106 Tsat 45% 04/12/17)  Problem/Plan:   1.  Streptococcal bacteremia:(cultures drawn at [facility] ED/[place], [place] )  Leukocytosis,  ID  Following Dr. Linus Salmons / [facility] here   Lajean Silvius Deniece Ree per primary 2.  ESRD -  TTS/  Off schedule  hd . No heparin. K+ 4.2   HD today to keep on schedule only 3 hrs 3.  Hypertension/volume  - now on med's  = Hydralazine 25 mg q 6 hr  / metoprolol 50 mg bid  Has ho of orthostatic hypotension, /BP  high prior  below edw  Now by about 3 kg / today  No evidence of volume overload by exam.  4.  Anemia  - HGB 10.7. no ESA 5.  Metabolic bone disease - Ca corec 9.3 Continue binders. /phos 5.2  6.  Nutrition - Albumin 3.4>2.6  Renal/Carb mod. Renal vit/nepro. Start supplement Nepro 7.  DM: per primary 8.  S/P Heart Transplant: Continue immunosuppressant therapy.  9. Hypothyroidism: per primary   Ernest Haber, PA-C [facility] [phone] 04/24/2017,9:14 AM  LOS: 2 days    Labs: Basic Metabolic Panel:  Recent Labs Lab 04/22/17 0145 04/23/17 0624 04/24/17 0539  NA 134* 133* 135  K 5.1 5.6* 4.2  CL 88* 91* 89*  CO2 30 25 27   GLUCOSE 189* 94 136*  BUN 57* 86* 42*  CREATININE 6.25* 7.43* 4.47*  CALCIUM 8.6* 8.0* 8.2*  PHOS  --  5.2*  --    Liver Function Tests:  Recent Labs Lab 04/22/17 0145 04/23/17 0624  AST 23  --   ALT 21  --   ALKPHOS 115  --   BILITOT 0.5  --   PROT 6.3*  --   ALBUMIN 3.4* 2.6*   No results for input(s): LIPASE, AMYLASE in the last 168 hours. No results for input(s): AMMONIA in the last 168 hours. CBC:  Recent Labs Lab 04/22/17 0145 04/23/17 0624 04/24/17 0539  WBC 18.2* 18.8* 13.4*  NEUTROABS 16.3* 15.8* 10.5*  HGB 11.0* 10.3* 10.7*  HCT 34.6* 32.9* 33.3*  MCV 96.6 95.6 96.5  PLT 203 166 195   Cardiac Enzymes: No results for input(s): CKTOTAL, CKMB, CKMBINDEX, TROPONINI in the last 168 hours. CBG:  Recent Labs Lab 04/22/17 2245 04/23/17 1238 04/23/17 1725 04/23/17 2237 04/24/17 0751  GLUCAP 110* 99 143* 208* 120*    Studies/Results: No results found.  Medications: . piperacillin-tazobactam (ZOSYN)  IV Stopped (04/24/17 0600)  . vancomycin     . acidophilus  1 capsule Oral Daily  . aspirin EC  81 mg Oral QHS  . calcium acetate  667 mg Oral TID WC  . Chlorhexidine Gluconate Cloth  6 each Topical Q0600  . cholecalciferol  1,000 Units Oral Daily  . cycloSPORINE modified  50 mg Oral QHS   And  . cycloSPORINE modified  75 mg Oral Daily  . donepezil  10 mg Oral QHS  . feeding supplement (NEPRO CARB STEADY)  237 mL Oral BID BM  . FLUoxetine  20 mg Oral Daily  . heparin subcutaneous  5,000 Units Subcutaneous Q8H  . hydrALAZINE  25 mg Oral Q6H  . insulin aspart  0-9 Units Subcutaneous TID WC  . levothyroxine  75 mcg Oral QAC breakfast  . metoprolol tartrate  50 mg Oral BID  . multivitamin  1 tablet Oral QHS  . mupirocin ointment  1 application Nasal BID  . mycophenolate  500 mg Oral Daily    And  . mycophenolate  250 mg Oral Once per day on Sun Mon Wed Fri   And  . mycophenolate  500 mg Oral Once per day on Tue Thu Sat  . pantoprazole  40 mg Oral Daily  . pravastatin  10 mg Oral QHS  . vitamin B-12  1,000 mcg Oral Q breakfast

## 2017-04-25 ENCOUNTER — Inpatient Hospital Stay (HOSPITAL_COMMUNITY): Payer: Medicare HMO

## 2017-04-25 DIAGNOSIS — I360 Nonrheumatic tricuspid (valve) stenosis: Secondary | ICD-10-CM

## 2017-04-25 DIAGNOSIS — E440 Moderate protein-calorie malnutrition: Secondary | ICD-10-CM | POA: Insufficient documentation

## 2017-04-25 LAB — CBC WITH DIFFERENTIAL/PLATELET
Basophils Absolute: 0 10*3/uL (ref 0.0–0.1)
Basophils Relative: 0 %
EOS ABS: 0 10*3/uL (ref 0.0–0.7)
Eosinophils Relative: 0 %
HCT: 34.8 % — ABNORMAL LOW (ref 39.0–52.0)
HEMOGLOBIN: 10.8 g/dL — AB (ref 13.0–17.0)
LYMPHS ABS: 1.5 10*3/uL (ref 0.7–4.0)
LYMPHS PCT: 13 %
MCH: 30.2 pg (ref 26.0–34.0)
MCHC: 31 g/dL (ref 30.0–36.0)
MCV: 97.2 fL (ref 78.0–100.0)
Monocytes Absolute: 1.3 10*3/uL — ABNORMAL HIGH (ref 0.1–1.0)
Monocytes Relative: 12 %
NEUTROS PCT: 75 %
Neutro Abs: 8.1 10*3/uL — ABNORMAL HIGH (ref 1.7–7.7)
Platelets: 204 10*3/uL (ref 150–400)
RBC: 3.58 MIL/uL — ABNORMAL LOW (ref 4.22–5.81)
RDW: 15.2 % (ref 11.5–15.5)
WBC: 10.9 10*3/uL — ABNORMAL HIGH (ref 4.0–10.5)

## 2017-04-25 LAB — CBC AND DIFFERENTIAL
HCT: 35 — AB (ref 41–53)
Hemoglobin: 10.8 — AB (ref 13.5–17.5)
Platelets: 204 (ref 150–399)
WBC: 10.9

## 2017-04-25 LAB — GLUCOSE, CAPILLARY
GLUCOSE-CAPILLARY: 204 mg/dL — AB (ref 65–99)
GLUCOSE-CAPILLARY: 148 mg/dL — AB (ref 65–99)
Glucose-Capillary: 207 mg/dL — ABNORMAL HIGH (ref 65–99)
Glucose-Capillary: 106 mg/dL — ABNORMAL HIGH (ref 65–99)

## 2017-04-25 MED ORDER — NEPRO/CARBSTEADY PO LIQD: 237.0000 mL | Freq: Two times a day (BID) | ORAL | 0 refills | Status: AC

## 2017-04-25 MED ORDER — AMOXICILLIN-POT CLAVULANATE 500-125 MG PO TABS: 1.0000 | ORAL_TABLET | Freq: Three times a day (TID) | ORAL | 0 refills | Status: DC

## 2017-04-25 MED ORDER — SODIUM CHLORIDE 0.9 % IV SOLN
3.0000 g | Freq: Once | INTRAVENOUS | Status: AC
  Administered 2017-04-25: 3 g via INTRAVENOUS
  Filled 2017-04-25: qty 3

## 2017-04-25 MED ORDER — HYDRALAZINE HCL 25 MG PO TABS: 25.0000 mg | ORAL_TABLET | Freq: Three times a day (TID) | ORAL | Status: DC

## 2017-04-25 NOTE — Progress Notes (Signed)
Subjective:  Tolerated HD  On schedule yest. This am pleasantly confused but about baseline MS, noted plans for [facility]  Objective Vital signs in last 24 hours: Vitals:   04/24/17 1750 04/24/17 1815 04/24/17 1937 04/25/17 0622  BP: (!) 143/61 129/63 (!) 159/59 (!) 144/62  Pulse: 71 74 79 82  Resp:  16 17 16   Temp:  98.1 F (36.7 C) 97.7 F (36.5 C) 98.3 F (36.8 C)  TempSrc:  Oral    SpO2:  100% 100% 98%  Weight:      Height:       Weight change: -2.7 kg (-5 lb 15.2 oz)  Physical Exam:  General: alert Pleasantly confused elderly WM,NAD, but reads off wall "today is wed June 6th" Heart: RRR no mur, rub Lungs: Clear bilaterally to auscultation without wheezes, rales, or rhonchi. Breathing is unlabored. Slightly decreased in bases. . Abdomen: Soft, non-tender, non-distended with normoactive bowel sounds.  Lower extremities:without edema Dialysis Access: LFA AVF + bruit.  Dialysis Orders: [place] T, Th,S 4 hrs 2.0 K/2.25 Ca 64.5 kg  400/Auto 1.5 -No Heparin  -Venofer 50mg  IV weekly (Last dose 04/12/17 Fe 106 Tsat 45% 04/12/17)  Problem/Plan:   1. Streptococcal bacteremia:(cultures drawn at [facility] ED/[place], [place] )  Leukocytosis,  ID  Following Dr. Linus Salmons / [facility] here no growth so far now on Unasyn  With noted plans per ID " if bc cultures remain negative today , ok to send out on [place] for 5 more days 2. ESRD - TTS/  now on schedule  hd .  3. Hypertension/volume - now on med's  = Hydralazine 25 mg q 6 hr  / metoprolol 50 mg bid  Has ho of orthostatic hypotension, /BP  high on admmit / now below edw  61.8 kg yest  New edw =62kg   No evidence of volume overload by exam.  4. Anemia - HGB 10.8. no ESA 5. Metabolic bone disease - Ca corec 9.3 Continue binders. /phos 5.2  6. Nutrition - Albumin 3.4>2.6  Renal/Carb mod. Renal vit/nepro. Start supplement Nepro 7. DM: per primary 8. S/P Heart Transplant: Continue immunosuppressant therapy.   9. Hypothyroidism: per primary   Ernest Haber, PA-C [facility] Beeper [phone] 04/25/2017,9:18 AM  LOS: 3 days   Labs: Basic Metabolic Panel:  Recent Labs Lab 04/22/17 0145 04/23/17 0624 04/24/17 0539  NA 134* 133* 135  K 5.1 5.6* 4.2  CL 88* 91* 89*  CO2 30 25 27   GLUCOSE 189* 94 136*  BUN 57* 86* 42*  CREATININE 6.25* 7.43* 4.47*  CALCIUM 8.6* 8.0* 8.2*  PHOS  --  5.2*  --    Liver Function Tests:  Recent Labs Lab 04/22/17 0145 04/23/17 0624  AST 23  --   ALT 21  --   ALKPHOS 115  --   BILITOT 0.5  --   PROT 6.3*  --   ALBUMIN 3.4* 2.6*   No results for input(s): LIPASE, AMYLASE in the last 168 hours. No results for input(s): AMMONIA in the last 168 hours. CBC:  Recent Labs Lab 04/22/17 0145 04/23/17 0624 04/24/17 0539 04/25/17 0555  WBC 18.2* 18.8* 13.4* 10.9*  NEUTROABS 16.3* 15.8* 10.5* 8.1*  HGB 11.0* 10.3* 10.7* 10.8*  HCT 34.6* 32.9* 33.3* 34.8*  MCV 96.6 95.6 96.5 97.2  PLT 203 166 195 204   Cardiac Enzymes: No results for input(s): CKTOTAL, CKMB, CKMBINDEX, TROPONINI in the last 168 hours. CBG:  Recent Labs Lab 04/24/17 0751 04/24/17 1205 04/24/17  1812 04/24/17 2202 04/25/17 0739  GLUCAP 120* 200* 102* 163* 207*    Studies/Results: No results found. Medications: . ampicillin-sulbactam (UNASYN) IV    . ampicillin-sulbactam (UNASYN) IV 3 g (04/25/17 0902)   . acidophilus  1 capsule Oral Daily  . aspirin EC  81 mg Oral QHS  . calcium acetate  667 mg Oral TID WC  . Chlorhexidine Gluconate Cloth  6 each Topical Q0600  . cholecalciferol  1,000 Units Oral Daily  . cycloSPORINE modified  50 mg Oral QHS   And  . cycloSPORINE modified  75 mg Oral Daily  . donepezil  10 mg Oral QHS  . feeding supplement (NEPRO CARB STEADY)  237 mL Oral BID BM  . FLUoxetine  20 mg Oral Daily  . heparin subcutaneous  5,000 Units Subcutaneous Q8H  . hydrALAZINE  25 mg Oral Q6H  . insulin aspart  0-9 Units Subcutaneous TID WC  .  levothyroxine  75 mcg Oral QAC breakfast  . metoprolol tartrate  50 mg Oral BID  . multivitamin  1 tablet Oral QHS  . mupirocin ointment  1 application Nasal BID  . mycophenolate  500 mg Oral Daily   And  . mycophenolate  250 mg Oral Once per day on Sun Mon Wed Fri   And  . mycophenolate  500 mg Oral Once per day on Tue Thu Sat  . pantoprazole  40 mg Oral Daily  . pravastatin  10 mg Oral QHS  . vitamin B-12  1,000 mcg Oral Q breakfast

## 2017-04-25 NOTE — Clinical Social Work Note (Signed)
Clinical Social Work Assessment  Patient Details  Name: [name] [name] MRN: [id number] Date of Birth: January 26, 1941  Date of referral:  04/24/17               Reason for consult:  Facility Placement                Permission sought to share information with:  Family Supports (Talked with wife regarding discharge disposition) Permission granted to share information::  No (Patient oriented to self only)  Name::     [name] [name]  Agency::     Relationship::  Wife  Contact Information:  [phone] (home), [phone] (mobile) and [phone] (room [facility])  Housing/Transportation Living arrangements for the past 2 months:  [place] of Information:  Spouse Patient Interpreter Needed:  None Criminal Activity/Legal Involvement Pertinent to Current Situation/Hospitalization:  No - Comment as needed Significant Relationships:  Adult Children, Spouse Lives with:  Spouse Do you feel safe going back to the place where you live?  No (Wife in agreement that rehab needed as she would be unable to care for patient. And SNF appropriate as she is now in hospital.) Need for family participation in patient care:  Yes (Comment)  Care giving concerns:  CSW talked with [name] [name] at her bedside on 3W regarding discharge disposition for patient. She is in agreement with rehab for her husband as she would not be able to care for him at home post-discharge.  Social Worker assessment / plan: Visited with wife on 6/5 regarding discharge recommendation and is in agreement with ST rehab and her facility preference is [facility] as this facility is very near to where they stay. When asked, [name] [name] reported that they have one son, [name] [name] who is listed as the secondary contact. CSW explained the facility search process and that [facility] [facility] would be contacted directly after clinical information sent electronically to determine if they can accept patient.   [name] [name] was not sure when she  would be discharged and asked CSW to keep her informed.  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 She was unhappy that she could not see her husband.  Patient/Family's Understanding of and Emotional Response to Diagnosis, Current Treatment, and Prognosis:  Not discussed.  Emotional Assessment Appearance:  Other (Comment Required (Did not visit with patient) Attitude/Demeanor/Rapport:  Unable to Assess Affect (typically observed):  Unable to Assess Orientation:  Oriented to Self Alcohol / Substance use:  Tobacco Use, Alcohol Use, Illicit Drugs (Patient reported that he does not smoke, drink or use illicit drugs) Psych involvement (Current and /or in the community):  No (Comment)  Discharge Needs  Concerns to be addressed:  Discharge Planning Concerns Readmission within the last 30 days:  No Current discharge risk:  None Barriers to Discharge:  Continued Medical Work up [employer] authorization Wed. afternoon, however per MD, waiting for results from echocardogram.)   [name] Feil, LCSW 04/25/2017, 5:29 PM

## 2017-04-25 NOTE — Clinical Social Work Placement (Signed)
   CLINICAL SOCIAL WORK PLACEMENT  NOTE **04/25/17 - Insurance authorization information below in Additional Comments:  Date:  04/25/2017  Patient Details  Name: [name] [name] MRN: [id number] Date of Birth: 1941/06/23  Clinical Social Work is seeking post-discharge placement for this patient at the [place] level of care (*CSW will initial, date and re-position this form in  chart as items are completed):  No (Wife provided CSW with facility preference)   Patient/family provided with [place] Work Department's list of facilities offering this level of care within the geographic area requested by the patient (or if unable, by the patient's family).  Yes   Patient/family informed of their freedom to choose among providers that offer the needed level of care, that participate in Medicare, Medicaid or managed care program needed by the patient, have an available bed and are willing to accept the patient.  No   Patient/family informed of [facility]'s ownership interest in [facility] and [facility] as well as of the fact that they are under no obligation to receive care at these facilities.  PASRR submitted to EDS on       PASRR number received on       Existing PASRR number confirmed on 04/24/17     FL2 transmitted to all facilities in geographic area requested by pt/family on 04/24/17     FL2 transmitted to all facilities within larger geographic area on       Patient informed that his/her managed care company has contracts with or will negotiate with certain facilities, including the following:        Yes   Patient/family informed of bed offers received.  Patient chooses bed at [facility] and [place] recommends and patient chooses bed at      Patient to be transferred to [facility] on  .  Patient to be transferred to facility by       Patient family notified on   of transfer.  Name of family  member notified:        PHYSICIAN      Additional Comment:  04/25/17 - Received insurance authorization at 4:01 pm from [place], [employer] rep. Auth [id number] (reference ID#), effective 6/6 for 3 days. RUG Level - RVB. Next review date is 6/8 and updates are to be sent to [place] (fax [phone]). Lexine Baton , admissions director with [facility] contacted 6/6 and provided [place] information.    _______________________________________________ Sable Feil, LCSW 04/25/2017, 5:36 PM

## 2017-04-25 NOTE — Progress Notes (Signed)
  Echocardiogram 2D Echocardiogram has been performed.  Darlina Sicilian M 04/25/2017, 3:32 PM

## 2017-04-25 NOTE — Care Management Important Message (Signed)
Important Message  Patient Details  Name: [name] [name] MRN: [id number] Date of Birth: [birth year]   Medicare Important Message Given:  Yes    Vani Gunner, Rory Percy, RN 04/25/2017, 11:41 AM

## 2017-04-25 NOTE — Clinical Social Work Note (Signed)
CSW received insurance authorization today(see Placement note for auth. information), however per MD, waiting on echocardiogram results. Nikki with [facility] provided with authorization information and advised that d/c would not be today if MD did not receive test results by 5 pm. CSW visited with wife on 3W and provided update on receiving insurance authorization and MD waiting for test results. Wife advised that she will be kept informed and per wife, she may discharge home tomorrow, 6/7. If test results favorable, patient will d/c to [facility] on Thursday, 6/7 after dialysis.  Gionni Freese Givens, MSW, LCSW Licensed Clinical Social Worker [place] [phone]

## 2017-04-25 NOTE — Progress Notes (Signed)
    [facility] for Infectious Disease   Reason for visit: Follow up on bacteremia  Interval History: no new complaints no fever, WBC improved to 10, blood cultures here have remained no growth.  No associated diarrhea.    Physical Exam: Constitutional:  Vitals:   04/25/17 0622 04/25/17 0950  BP: (!) 144/62 137/71  Pulse: 82 79  Resp: 16 18  Temp: 98.3 F (36.8 C) 98.2 F (36.8 C)   patient appears in NAD Respiratory: Normal respiratory effort; CTA B Cardiovascular: RRR GI: soft  Review of Systems: Constitutional: negative for fevers, chills and anorexia Integument/breast: negative for rash  Lab Results  Component Value Date   WBC 10.9 (H) 04/25/2017   HGB 10.8 (L) 04/25/2017   HCT 34.8 (L) 04/25/2017   MCV 97.2 04/25/2017   PLT 204 04/25/2017    Lab Results  Component Value Date   CREATININE 4.47 (H) 04/24/2017   BUN 42 (H) 04/24/2017   NA 135 04/24/2017   K 4.2 04/24/2017   CL 89 (L) 04/24/2017   CO2 27 04/24/2017    Lab Results  Component Value Date   ALT 21 04/22/2017   AST 23 04/22/2017   ALKPHOS 115 04/22/2017     Microbiology: Recent Results (from the past 240 hour(s))  Blood culture (routine x 2)     Status: None (Preliminary result)   Collection Time: 04/22/17  1:45 AM  Result Value Ref Range Status   Specimen Description BLOOD RIGHT ANTECUBITAL  Final   Special Requests   Final    BOTTLES DRAWN AEROBIC AND ANAEROBIC Blood Culture adequate volume   Culture NO GROWTH 2 DAYS  Final   Report Status PENDING  Incomplete  Blood culture (routine x 2)     Status: None (Preliminary result)   Collection Time: 04/22/17  2:15 AM  Result Value Ref Range Status   Specimen Description BLOOD RIGHT ARM  Final   Special Requests   Final    BOTTLES DRAWN AEROBIC AND ANAEROBIC Blood Culture adequate volume   Culture NO GROWTH 2 DAYS  Final   Report Status PENDING  Incomplete  MRSA PCR Screening     Status: Abnormal   Collection Time: 04/22/17  6:37 AM    Result Value Ref Range Status   MRSA by PCR POSITIVE (A) NEGATIVE Final    Comment:        The GeneXpert MRSA Assay (FDA approved for NASAL specimens only), is one component of a comprehensive MRSA colonization surveillance program. It is not intended to diagnose MRSA infection nor to guide or monitor treatment for MRSA infections. RESULT CALLED TO, READ BACK BY AND VERIFIED WITH: C.HOLDSON RN AT 0800 04/22/17 BY A.DAVIS     Impression/Plan:  1. Presumed Strep bacteremia - no obvious source and has not grown here in blood cultures.  WBC has improved.  No fever. Patient feels well. On Unasyn and no changes.   Ok to use augmentin at discharge.  2.  Heart transplant - on immunosuppressives for this.  Echo to be done soon.  With no positive blood cultures, no further work up indicated.

## 2017-04-25 NOTE — Progress Notes (Signed)
Made a request to Hemodialysis to [facility] patient early in a.m. for first round because patient will be discharged to SNF tomorrow.  Sheliah Plane RN

## 2017-04-25 NOTE — Discharge Summary (Addendum)
Physician Discharge Summary  [name] [name] HKV:[id number] DOB: 09-Jun-1941 DOA: 04/21/2017  PCP: Junie Panning, NP  Admit date: 04/21/2017 Discharge date: 04/26/2017  Admitted From: Home Disposition: [facility]   Recommendations for Outpatient Follow-up:  1. Follow up with PCP in 1-2 weeks 2. Monitor blood culture results pending (NGTD) at time of discharge. Discharged on augmentin x5 more days per ID recommendations. No vegetations on echocardiogram. 3. Monitor BMP, CBC.  4. Continue routine HD, new EDW is 62kg. 5. Follow up with dermatology in the next 1 - 2 weeks for evaluation and likely biopsy of left upper lip lesion and right dorsal forearm lesion.  Home Health: N/A Equipment/Devices: None Discharge Condition: Stable CODE STATUS: Full Diet recommendation: Renal, heart healthy, carb modified  Brief/Interim Summary: [name] [name] a 76 y.o.malewith a history of heart transplant, ESRD, CAD, T2DM, HTN, HLD, and dementia who presented to the ED due to report of positive blood cultures. While visiting [place], [place] last week, he was evaluated at urgent care for a left arm wound on 6/1. He was prescribed augmentin, and topical mupirocin ointment and sent home. That night he became nonverbal, not able to follow commands, so he was taken to [facility] in [place], [place] in the very early morning hours of 6/2. Work up there included CT head, imaging of his abdomen, and blood work including cultures. Per his wife, from whom all history is obtained due to patient's confusion, they left the ED in the morning and drove home to [place]. He had a low grade fever but was acting at his baseline without cough, dyspnea, chest pain or other complaints, when they received a call from the ED reporting a positive blood culture. They were referred to the ED where work up showed WBC 18.2 and CXR demonstrated multiple infiltrates concerning for HCAP. Vancomycin and zosyn were  administered and he was admitted 6/3. ID was consulted and after blood cultures remained negative on 6/5, antibiotics were changed to unasyn. He has remained afebrile with no growth on blood cultures drawn here. Per ID recommendations, he will continue augmentin for 5 days.   Discharge Diagnoses:  Principal Problem:   Bacteremia Active Problems:   Heart transplanted ([place])   Hypothyroidism   ESRD on dialysis ([place])   Barrett's esophagus   Anemia of chronic disease   Depression   Controlled diabetes mellitus type 2 with complications ([place])   Uncontrolled hypertension   Malnutrition of moderate degree  Presumed streptococcal bacteremia: Per family report, cultures drawn at [facility] ED were said to be positive for streptococcal bacteria within 12 hours. Wife not sure how many bottles. In patient on immunosuppressants for heart transplant. +Leukocytosis without other SIRS.  - ROI sent to [facility] 6/5, confirmation that this was received was obtained, still awaiting culture reports, though leukocytosis has resolved, so organism likely sensitive. Care Everywhere query unavailable. - Vanc/zosyn 6/3 - 6/5 > unasyn 6/5 - 6/6 > augmentin x 5 more days per ID recommendations.  - Follow up blood cultures drawn at admission here, NGTD.  - Echocardiogram performed 6/6 > no evidence of vegetation.   ESRD: TTS schedule. Schedule thrown off by recent vacation: Last got HD 6/1, planned to return 6/4, then back to TTS.  - Back on schedule 6/5, next 6/7. EDW 62kg per nephrology - Continue phoslo   History of heart transplant 2001: [facility], [place], [place].  - Continue cyclosporine and cellcept  Controlled T2DM: Diet-controlled, HbA1c 5.5% in 2017. Initially  hyperglycemic likely due to infection. - Continue carb-modified goal  Hypertension with severe range HTN:  - Continue metoprolol 50mg  BID. Significant improvement with HD. - Started hydralazine 25mg  q6h for BP  control > will continue 25mg  TID. [name] adamant that norvasc should not be given due to reaction (severe leg swelling), though this was explained to be likely side effect, not allergy.   Right dorsal forearm wound:  - Plan dermatology follow up for consideration of biopsy.   Upper lip wound: Present x3 months per pt. No surrounding erythema.  - Will require outpatient biopsy.   Hypothyroidism: Last TSH 1.4 - Continue levothyroxine 75 g daily  Barrett's esophagus: RN noted pausing prior to swallow. No dysphagia per SLP - Continue PO PPI  Anemia of chronic disease: - Monitor  Depression and dementia: - Continue fluoxetine and aricept - Wife reports about 1 year of increasing dependence on her for ADL's, general mental decline.  - PT recommended SNF at discharge.  Discharge Instructions  Allergies as of 04/26/2017      Reactions   Norvasc [amlodipine Besylate] Swelling, Other (See Comments)   Swelling of the lower body       Medication List    STOP taking these medications   albuterol (2.5 MG/3ML) 0.083% nebulizer solution Commonly known as:  PROVENTIL   amLODipine 5 MG tablet Commonly known as:  NORVASC   ciprofloxacin 500 MG tablet Commonly known as:  CIPRO     TAKE these medications   acetaminophen 325 MG tablet Commonly known as:  TYLENOL Take 2 tablets (650 mg total) by mouth every 6 (six) hours as needed for mild pain (or Fever >/= 101). What changed:  when to take this   amoxicillin-clavulanate 500-125 MG tablet Commonly known as:  AUGMENTIN Take 1 tablet (500 mg total) by mouth at bedtime. What changed:  when to take this   aspirin EC 81 MG tablet Take 81 mg by mouth at bedtime.   calcium acetate 667 MG capsule Commonly known as:  PHOSLO Take 1 capsule (667 mg total) by mouth 3 (three) times daily with meals.   cholecalciferol 1000 units tablet Commonly known as:  VITAMIN D Take 1,000 Units by mouth daily.   cycloSPORINE modified 25 MG  capsule Commonly known as:  NEORAL Take 50-75 mg by mouth See admin instructions. 75 mg in the morning and 50 mg in the evening   diphenoxylate-atropine 2.5-0.025 MG tablet Commonly known as:  LOMOTIL Take 1 tablet by mouth 4 (four) times daily as needed for diarrhea or loose stools.   donepezil 10 MG tablet Commonly known as:  ARICEPT Take 10 mg by mouth at bedtime.   feeding supplement (NEPRO CARB STEADY) Liqd Take 237 mLs by mouth 2 (two) times daily between meals.   FLUoxetine 20 MG capsule Commonly known as:  PROZAC Take 20 mg by mouth daily.   fluticasone 50 MCG/ACT nasal spray Commonly known as:  FLONASE Place 2 sprays into both nostrils at bedtime as needed for allergies.   hydrALAZINE 25 MG tablet Commonly known as:  APRESOLINE Take 1 tablet (25 mg total) by mouth 3 (three) times daily.   lansoprazole 30 MG capsule Commonly known as:  PREVACID Take 30 mg by mouth 2 (two) times daily before a meal.   levothyroxine 75 MCG tablet Commonly known as:  SYNTHROID, LEVOTHROID Take 75 mcg by mouth daily.   metoprolol tartrate 25 MG tablet Commonly known as:  LOPRESSOR Take 2 tablets (50 mg total) by  mouth 2 (two) times daily.   multivitamin Tabs tablet Take 1 tablet by mouth daily.   mycophenolate 250 MG capsule Commonly known as:  CELLCEPT Take 250-500 mg by mouth daily. Take 500 mg every morning and take 250 mg every evening on Sunday, Monday, Wednesday and Friday then take 500 mg every evening on Tuesday Thursday and Saturday   pravastatin 10 MG tablet Commonly known as:  PRAVACHOL Take 10 mg by mouth at bedtime.   PRO-BIOTIC BLEND Caps Take 1 capsule by mouth daily.   vitamin B-12 1000 MCG tablet Commonly known as:  CYANOCOBALAMIN Take 1,000 mcg by mouth daily with breakfast.      Follow-up Information    Smothers, Andree Elk, NP.   Specialty:  Nurse Practitioner Why:  Appointment date on 05/02/2017 10:50a  Contact information: [place] [address] [phone]          Allergies  Allergen Reactions  . Norvasc [Amlodipine Besylate] Swelling and Other (See Comments)    Swelling of the lower body     Consultations:  Nephrology, Dr. Joelyn Oms  Infectious disease, Dr. Linus Salmons  Procedures/Studies: Dg Chest 2 View  Result Date: 04/22/2017 CLINICAL DATA:  Rash, positive blood cultures EXAM: CHEST  2 VIEW COMPARISON:  02/04/2017, 11/09/2016 FINDINGS: Post sternotomy changes. Ill-defined opacities in the peripheral right upper lobe, right base and left mid lung. No pleural effusion. Mild cardiomegaly with aortic atherosclerosis. No pneumothorax. Vascular stent in the left axilla. IMPRESSION: 1. Ill-defined multifocal opacity in the right upper lobe, right base and left upper lobe suspicious for infiltrates. 2. Cardiomegaly without edema. Electronically Signed   By: Donavan Foil M.D.   On: 04/22/2017 02:07   Echocardiogram 04/25/2017: - Left ventricle: The cavity size was normal. Wall thickness was   increased in a pattern of severe LVH. Systolic function was   normal. The estimated ejection fraction was in the range of 55%   to 60%. Wall motion was normal; there were no regional wall   motion abnormalities. Left ventricular diastolic function   parameters were normal. - Atrial septum: There was increased thickness of the septum,   consistent with lipomatous hypertrophy. A patent foramen ovale   cannot be excluded.  Impressions: - There was no evidence of a vegetation.  Subjective: Pt without fevers, chills, cough, chest pain or dyspnea.   Discharge Exam: BP 125/65   Pulse 84   Temp 97.6 F (36.4 C) (Oral)   Resp 16   Ht 5\' 10"  (1.778 m)   Wt 61 kg (134 lb 7.7 oz)   SpO2 100%   BMI 19.30 kg/m   General: Mildly pleasantly confused 76yo M in no distress Cardiovascular: RRR, no murmur, no JVD Respiratory: Nonlabored, clear Abdominal: Soft, NT, ND, bowel sounds + Extremities: Warm, no  deformities. LUE AVF +thrill.  Skin: Healed sternotomy scar. Thin skin throughout with scabbed left forearm avulsion without erythema or drainage. Upper lip with raised wound, eschar unroofed with intact underlying skin. Irregular raised pink lesion on right dorsal forearm clean, dry.   Labs: Basic Metabolic Panel:  Recent Labs Lab 04/22/17 0145 04/23/17 0624 04/24/17 0539 04/26/17 0728  NA 134* 133* 135 132*  K 5.1 5.6* 4.2 3.0*  CL 88* 91* 89* 91*  CO2 30 25 27 25   GLUCOSE 189* 94 136* 142*  BUN 57* 86* 42* 40*  CREATININE 6.25* 7.43* 4.47* 5.00*  CALCIUM 8.6* 8.0* 8.2* 8.0*  PHOS  --  5.2*  --  3.8   Liver Function Tests:  Recent Labs Lab 04/22/17 0145 04/23/17 0624 04/26/17 0728  AST 23  --   --   ALT 21  --   --   ALKPHOS 115  --   --   BILITOT 0.5  --   --   PROT 6.3*  --   --   ALBUMIN 3.4* 2.6* 2.4*   CBC:  Recent Labs Lab 04/22/17 0145 04/23/17 0624 04/24/17 0539 04/25/17 0555 04/26/17 0427  WBC 18.2* 18.8* 13.4* 10.9* 10.0  NEUTROABS 16.3* 15.8* 10.5* 8.1* 6.5  HGB 11.0* 10.3* 10.7* 10.8* 9.7*  HCT 34.6* 32.9* 33.3* 34.8* 31.3*  MCV 96.6 95.6 96.5 97.2 98.4  PLT 203 166 195 204 209   Microbiology Recent Results (from the past 240 hour(s))  Blood culture (routine x 2)     Status: None (Preliminary result)   Collection Time: 04/22/17  1:45 AM  Result Value Ref Range Status   Specimen Description BLOOD RIGHT ANTECUBITAL  Final   Special Requests   Final    BOTTLES DRAWN AEROBIC AND ANAEROBIC Blood Culture adequate volume   Culture NO GROWTH 3 DAYS  Final   Report Status PENDING  Incomplete  Blood culture (routine x 2)     Status: None (Preliminary result)   Collection Time: 04/22/17  2:15 AM  Result Value Ref Range Status   Specimen Description BLOOD RIGHT ARM  Final   Special Requests   Final    BOTTLES DRAWN AEROBIC AND ANAEROBIC Blood Culture adequate volume   Culture NO GROWTH 3 DAYS  Final   Report Status PENDING  Incomplete  MRSA PCR  Screening     Status: Abnormal   Collection Time: 04/22/17  6:37 AM  Result Value Ref Range Status   MRSA by PCR POSITIVE (A) NEGATIVE Final    Comment:        The GeneXpert MRSA Assay (FDA approved for NASAL specimens only), is one component of a comprehensive MRSA colonization surveillance program. It is not intended to diagnose MRSA infection nor to guide or monitor treatment for MRSA infections. RESULT CALLED TO, READ BACK BY AND VERIFIED WITH: C.HOLDSON RN AT 0800 04/22/17 BY A.DAVIS     Time coordinating discharge: Approximately 40 minutes  Vance Gather, MD  [facility] 04/26/2017, 10:39 AM Pager [phone]

## 2017-04-25 NOTE — Progress Notes (Signed)
PROGRESS NOTE  [name] [name]  XNT:[id number] DOB: 1941/08/27 DOA: 04/21/2017 PCP: Junie Panning, NP   Brief Narrative: [name] [name] is a 76 y.o. male with a history of heart transplant, ESRD, CAD, T2DM, HTN, HLD, and dementia who presented to the ED due to report of positive blood cultures. While visiting [name], [place] last week, he had been evaluated at urgent care for a left arm wound on 6/1. He was prescribed augmentin, and topical mupirocin ointment and sent home. That night he became nonverbal, not able to follow commands, so he was taken to [facility] in [place], [place] in the very early morning hours of 6/2. Work up there included CT head, imaging of his abdomen, and blood work including cultures. Per his wife, from whom all history is obtained due to patient's confusion, they left the ED in the morning and drove home to [place]. He had a low grade fever but was acting at his baseline without cough, dyspnea, chest pain or other complaints, when they received a call from the ED reporting a positive blood culture. They were referred to the ED where work up showed WBC 18.2 and CXR demonstrated multiple infiltrates concerning for HCAP. Vancomycin and zosyn were administered and he was admitted 6/3. ID was consulted and after blood cultures remained negative on 6/5, antibiotics were changed to unasyn. Plan to DC on augmentin if echocardiogram shows no vegetations. Unfortunately, the imaging system was down today, so this has been delayed. The patient will need to be discharged 6/7 at the earliest following HD.  Assessment & Plan: Principal Problem:   Bacteremia Active Problems:   Heart transplanted ([name])   Hypothyroidism   ESRD on dialysis ([name])   Barrett's esophagus   Anemia of chronic disease   Depression   Controlled diabetes mellitus type 2 with complications ([name])   Uncontrolled hypertension   Malnutrition of moderate degree  Presumed streptococcal bacteremia:  Per family report, cultures drawn at [facility] ED were said to be positive for streptococcal bacteria within 12 hours. Wife not sure how many bottles. In patient on immunosuppressants for heart transplant. +Leukocytosis without other SIRS.  - Obtaining medical records (particularly culture data) from medical records from [facility] in [name], [place]. No returned records today. Care Everywhere query unavailable. - Vanc/zosyn > unasyn per ID recommendations. Will DC on augmentin x5 days if echo shows no vegetations  - Follow up blood cultures drawn at admission here, NGTD.  - Echocardiogram ordered  ESRD: TTS schedule. Schedule thrown off by recent vacation: Last got HD 6/1, planned to return 6/4, then back to TTS.  - Routine HD; will need HD 6/7 prior to DC (if echo ok) - Continue phoslo   History of heart transplant 2001: [facility], [place], [place].  - Continue cyclosporine and cellcept  Controlled T2DM: Diet-controlled, HbA1c 5.5% in 2017. Initially hyperglycemic likely due to infection. - SSI. At inpatient goal.  Hypertension with severe range HTN:  - Continue metoprolol 50mg  BID. Significant improvement with HD. - Started hydralazine q8h for BP control. [name] adamant that norvasc should not be given due to reaction (severe leg swelling), though this was explained to be likely side effect, not allergy.   Right dorsal forearm wound:  - Plan dermatology follow up for consideration of biopsy.   Upper lip wound: Present x3 months per pt. No surrounding erythema.  - Will require outpatient biopsy.   Hypothyroidism: Last TSH 1.4 - Continue levothyroxine 75 g daily  Barrett's esophagus: RN noted pausing prior to swallow. No dysphagia per SLP - Continue PO PPI  Anemia of chronic disease: - Monitor  Depression and dementia: - Continue fluoxetine and aricept - Wife reports about 1 year of increasing dependence on her for  ADL's, general mental decline.   Disposition Plan: DC to SNF pending echo results  Echocardiogram 04/25/2016 Study Conclusions - Left ventricle: The cavity size was normal. Wall thickness was increased in a pattern of mild LVH. Systolic function was normal. The estimated ejection fraction was in the range of 60% to 65%. Wall motion was normal; there were no regional wall motion abnormalities. Left ventricular diastolic function parameters were normal. - Left atrium: The atrium was moderately dilated. - Atrial septum: No defect or patent foramen ovale was identified  Antimicrobials:  Vancomycin 6/3 >> 6/5  Zosyn 6/3 >> 6/5  Unasyn 6/5 >>   Subjective: No complaints. Tired due to poor sleep. No fevers.   Objective: BP 137/71 (BP Location: Right Arm)   Pulse 79   Temp 98.2 F (36.8 C) (Oral)   Resp 18   Ht 5\' 10"  (1.778 m)   Wt 61.8 kg (136 lb 3.9 oz)   SpO2 99%   BMI 19.55 kg/m   Gen: No distess Pulm: Non-labored; CTAB  CV: RRR +murmur. No JVD. LUE AVF +thrill.  GI: +BS, soft, nontender Skin: Healed sternotomy scar. Thin skin throughout with scabbed left forearm avulsion without erythema or drainage. Upper lip with raised wound, eschar unroofed with intact underlying skin. Irregular raised pink lesion on right dorsal forearm clean, dry.  Neuro: Alert, oriented x2, CN II-XII without deficits  CBC:  Recent Labs Lab 04/22/17 0145 04/23/17 0624 04/24/17 0539 04/25/17 0555  WBC 18.2* 18.8* 13.4* 10.9*  NEUTROABS 16.3* 15.8* 10.5* 8.1*  HGB 11.0* 10.3* 10.7* 10.8*  HCT 34.6* 32.9* 33.3* 34.8*  MCV 96.6 95.6 96.5 97.2  PLT 203 166 195 409   Basic Metabolic Panel:  Recent Labs Lab 04/22/17 0145 04/23/17 0624 04/24/17 0539  NA 134* 133* 135  K 5.1 5.6* 4.2  CL 88* 91* 89*  CO2 30 25 27   GLUCOSE 189* 94 136*  BUN 57* 86* 42*  CREATININE 6.25* 7.43* 4.47*  CALCIUM 8.6* 8.0* 8.2*  PHOS  --  5.2*  --     Recent Results (from the past 240  hour(s))  Blood culture (routine x 2)     Status: None (Preliminary result)   Collection Time: 04/22/17  1:45 AM  Result Value Ref Range Status   Specimen Description BLOOD RIGHT ANTECUBITAL  Final   Special Requests   Final    BOTTLES DRAWN AEROBIC AND ANAEROBIC Blood Culture adequate volume   Culture NO GROWTH 3 DAYS  Final   Report Status PENDING  Incomplete  Blood culture (routine x 2)     Status: None (Preliminary result)   Collection Time: 04/22/17  2:15 AM  Result Value Ref Range Status   Specimen Description BLOOD RIGHT ARM  Final   Special Requests   Final    BOTTLES DRAWN AEROBIC AND ANAEROBIC Blood Culture adequate volume   Culture NO GROWTH 3 DAYS  Final   Report Status PENDING  Incomplete  MRSA PCR Screening     Status: Abnormal   Collection Time: 04/22/17  6:37 AM  Result Value Ref Range Status   MRSA by PCR POSITIVE (A) NEGATIVE Final    Comment:        The GeneXpert MRSA Assay (FDA approved  for NASAL specimens only), is one component of a comprehensive MRSA colonization surveillance program. It is not intended to diagnose MRSA infection nor to guide or monitor treatment for MRSA infections. RESULT CALLED TO, READ BACK BY AND VERIFIED WITH: C.HOLDSON RN AT 0800 04/22/17 BY A.DAVIS      Time spent: 25 minutes.  Vance Gather, MD [facility] Pager [phone]  If 7PM-7AM, please contact night-coverage [email or web address] Password [facility] 04/25/2017, 4:57 PM

## 2017-04-26 DIAGNOSIS — E440 Moderate protein-calorie malnutrition: Secondary | ICD-10-CM

## 2017-04-26 LAB — CBC WITH DIFFERENTIAL/PLATELET
Basophils Absolute: 0 10*3/uL (ref 0.0–0.1)
Basophils Relative: 0 %
EOS ABS: 0.1 10*3/uL (ref 0.0–0.7)
EOS PCT: 1 %
HCT: 31.3 % — ABNORMAL LOW (ref 39.0–52.0)
Hemoglobin: 9.7 g/dL — ABNORMAL LOW (ref 13.0–17.0)
LYMPHS ABS: 2.5 10*3/uL (ref 0.7–4.0)
LYMPHS PCT: 25 %
MCH: 30.5 pg (ref 26.0–34.0)
MCHC: 31 g/dL (ref 30.0–36.0)
MCV: 98.4 fL (ref 78.0–100.0)
MONOS PCT: 9 %
Monocytes Absolute: 0.9 10*3/uL (ref 0.1–1.0)
NEUTROS PCT: 65 %
Neutro Abs: 6.5 10*3/uL (ref 1.7–7.7)
Platelets: 209 10*3/uL (ref 150–400)
RBC: 3.18 MIL/uL — AB (ref 4.22–5.81)
RDW: 15.4 % (ref 11.5–15.5)
WBC: 10 10*3/uL (ref 4.0–10.5)

## 2017-04-26 LAB — RENAL FUNCTION PANEL
Albumin: 2.4 g/dL — ABNORMAL LOW (ref 3.5–5.0)
Anion gap: 16 — ABNORMAL HIGH (ref 5–15)
BUN: 40 mg/dL — ABNORMAL HIGH (ref 6–20)
CALCIUM: 8 mg/dL — AB (ref 8.9–10.3)
CHLORIDE: 91 mmol/L — AB (ref 101–111)
CO2: 25 mmol/L (ref 22–32)
Creatinine, Ser: 5 mg/dL — ABNORMAL HIGH (ref 0.61–1.24)
GFR calc non Af Amer: 10 mL/min — ABNORMAL LOW (ref >60)
GFR, EST AFRICAN AMERICAN: 12 mL/min — AB (ref >60)
GLUCOSE: 142 mg/dL — AB (ref 65–99)
Phosphorus: 3.8 mg/dL (ref 2.5–4.6)
Potassium: 3 mmol/L — ABNORMAL LOW (ref 3.5–5.1)
SODIUM: 132 mmol/L — AB (ref 135–145)

## 2017-04-26 LAB — GLUCOSE, CAPILLARY
GLUCOSE-CAPILLARY: 242 mg/dL — AB (ref 65–99)
GLUCOSE-CAPILLARY: 252 mg/dL — AB (ref 65–99)
Glucose-Capillary: 233 mg/dL — ABNORMAL HIGH (ref 65–99)

## 2017-04-26 LAB — ECHOCARDIOGRAM COMPLETE
Height: 70 in
WEIGHTICAEL: 2179.91 [oz_av]

## 2017-04-26 LAB — CBC AND DIFFERENTIAL: WBC: 10

## 2017-04-26 MED ORDER — ACETAMINOPHEN 325 MG PO TABS
ORAL_TABLET | ORAL | Status: AC
  Administered 2017-04-26: 650 mg via ORAL
  Filled 2017-04-26: qty 2

## 2017-04-26 MED ORDER — AMOXICILLIN-POT CLAVULANATE 500-125 MG PO TABS: 1.0000 | ORAL_TABLET | Freq: Every day | ORAL | 0 refills | Status: DC

## 2017-04-26 NOTE — Procedures (Signed)
I was present at this dialysis session. I have reviewed the session itself and made appropriate changes.   Below EDW which will be lower at DC. 2K bath. AVF.    Filed Weights   04/24/17 1441 04/24/17 1748 04/25/17 2022  Weight: 62.8 kg (138 lb 7.2 oz) 61.8 kg (136 lb 3.9 oz) 60 kg (132 lb 4.4 oz)     Recent Labs Lab 04/23/17 0624 04/24/17 0539  NA 133* 135  K 5.6* 4.2  CL 91* 89*  CO2 25 27  GLUCOSE 94 136*  BUN 86* 42*  CREATININE 7.43* 4.47*  CALCIUM 8.0* 8.2*  PHOS 5.2*  --      Recent Labs Lab 04/24/17 0539 04/25/17 0555 04/26/17 0427  WBC 13.4* 10.9* 10.0  NEUTROABS 10.5* 8.1* 6.5  HGB 10.7* 10.8* 9.7*  HCT 33.3* 34.8* 31.3*  MCV 96.5 97.2 98.4  PLT 195 204 209    Scheduled Meds: . acidophilus  1 capsule Oral Daily  . aspirin EC  81 mg Oral QHS  . calcium acetate  667 mg Oral TID WC  . Chlorhexidine Gluconate Cloth  6 each Topical Q0600  . cholecalciferol  1,000 Units Oral Daily  . cycloSPORINE modified  50 mg Oral QHS   And  . cycloSPORINE modified  75 mg Oral Daily  . donepezil  10 mg Oral QHS  . feeding supplement (NEPRO CARB STEADY)  237 mL Oral BID BM  . FLUoxetine  20 mg Oral Daily  . heparin subcutaneous  5,000 Units Subcutaneous Q8H  . hydrALAZINE  25 mg Oral Q6H  . insulin aspart  0-9 Units Subcutaneous TID WC  . levothyroxine  75 mcg Oral QAC breakfast  . metoprolol tartrate  50 mg Oral BID  . multivitamin  1 tablet Oral QHS  . mupirocin ointment  1 application Nasal BID  . mycophenolate  500 mg Oral Daily   And  . mycophenolate  250 mg Oral Once per day on Sun Mon Wed Fri   And  . mycophenolate  500 mg Oral Once per day on Tue Thu Sat  . pantoprazole  40 mg Oral Daily  . pravastatin  10 mg Oral QHS  . vitamin B-12  1,000 mcg Oral Q breakfast   Continuous Infusions: . ampicillin-sulbactam (UNASYN) IV 3 g (04/25/17 2115)   PRN Meds:.acetaminophen, diphenoxylate-atropine, fluticasone, hydrALAZINE, morphine injection, ondansetron    Pearson Grippe  MD 04/26/2017, 7:55 AM

## 2017-04-26 NOTE — Progress Notes (Signed)
PT Cancellation Note  Patient Details Name: [name] [name] MRN: [id number] DOB: 1941/02/22   Cancelled Treatment:    Reason Eval/Treat Not Completed: Patient at procedure or test/unavailable. Pt in HD. Will check back later this afternoon as time allows if pt is able to tolerate.    Scheryl Marten PT, DPT  [phone]  04/26/2017, 10:50 AM

## 2017-04-26 NOTE — Clinical Social Work Placement (Addendum)
   CLINICAL SOCIAL WORK PLACEMENT  NOTE 04/26/17 - DISCHARGED TO [facility]  Date:  04/26/2017  Patient Details  Name: [name] [name] MRN: [id number] Date of Birth: 1941-02-09  Clinical Social Work is seeking post-discharge placement for this patient at the [place] level of care (*CSW will initial, date and re-position this form in  chart as items are completed):  No (Wife provided CSW with facility preference)   Patient/family provided with [place] Work Department's list of facilities offering this level of care within the geographic area requested by the patient (or if unable, by the patient's family).  Yes   Patient/family informed of their freedom to choose among providers that offer the needed level of care, that participate in Medicare, Medicaid or managed care program needed by the patient, have an available bed and are willing to accept the patient.  No   Patient/family informed of [facility]'s ownership interest in [facility] and [facility], as well as of the fact that they are under no obligation to receive care at these facilities.  PASRR submitted to EDS on       PASRR number received on       Existing PASRR number confirmed on 04/24/17     FL2 transmitted to all facilities in geographic area requested by pt/family on 04/24/17     FL2 transmitted to all facilities within larger geographic area on       Patient informed that his/her managed care company has contracts with or will negotiate with certain facilities, including the following:        Yes   Patient/family informed of bed offers received.  Patient chooses bed at [facility]     Physician recommends and patient chooses bed at      Patient to be transferred to [facility] on  04/26/17.  Patient to be transferred to facility by  ambulance     Patient family notified on  04/26/17 of transfer.  Name of family member  notified:   Wife, [name] and son [name] by phone.     PHYSICIAN       Additional Comment:    _______________________________________________ Sable Feil, LCSW 04/26/2017, 3:05 PM

## 2017-04-26 NOTE — Progress Notes (Signed)
OT Cancellation Note  Patient Details Name: [name] [name] MRN: [id number] DOB: 02/29/1941   Cancelled Treatment:    Reason Eval/Treat Not Completed: Patient at procedure or test/ unavailable (currently in HD). Will follow up for OT eval as time allows.  Binnie Kand M.S., OTR/L Pager: [phone]  04/26/2017, 8:55 AM

## 2017-04-26 NOTE — Care Management Note (Signed)
Case Management Note  Patient Details  Name: Deitrick Ferreri MRN: [id number] Date of Birth: [birth year]  Subjective/Objective:      CM following for progression and d/c planning.               Action/Plan: 04/26/2017 Plan for pt to d/c to SNF, CSW has obtained insurance approval and pt will d/c to [facility] today. No HH or DME needs prior to d/c.   Expected Discharge Date:  04/26/17               Expected Discharge Plan:  [place]  In-House Referral:  Clinical Social Work  Discharge planning Services  NA  Post Acute Care Choice:  NA Choice offered to:  NA  DME Arranged:  N/A DME Agency:  NA  HH Arranged:  NA HH Agency:  NA  Status of Service:  Completed, signed off  If discussed at [employer] of Stay Meetings, dates discussed:    Additional Comments:  Adron Bene, RN 04/26/2017, 12:38 PM

## 2017-04-26 NOTE — Progress Notes (Signed)
OT Cancellation Note  Patient Details Name: [name] [name] MRN: [id number] DOB: 19-Nov-1941   Cancelled Treatment:    Reason Eval/Treat Not Completed: Other (comment) (plan for d/c to SNF; will defer OT to next venue). Per CSW note from 6/7, pt with plans to d/c to [facility] today. Will defer OT needs to the next venue of care. Please re-consult if needs change. Thank you for this referral.  Binnie Kand M.S., OTR/L Pager: [phone]  04/26/2017, 2:13 PM

## 2017-04-26 NOTE — Progress Notes (Addendum)
Patient discharged to SNF, report called to RN at [facility]. Patient left before dinner tray arrived, so insulin and dinner not given, RN at SNF made aware. Transport could not take patients personal wheelchair so [name] [name] (son) was notified and plans to pick up on 6/8. IV discontinued. Patient left floor with transport. AVS reviewed with family and patient

## 2017-04-27 LAB — CULTURE, BLOOD (ROUTINE X 2)
CULTURE: NO GROWTH
Culture: NO GROWTH
SPECIAL REQUESTS: ADEQUATE
Special Requests: ADEQUATE

## 2017-04-30 ENCOUNTER — Encounter: Payer: Self-pay | Admitting: Internal Medicine

## 2017-04-30 ENCOUNTER — Non-Acute Institutional Stay (SKILLED_NURSING_FACILITY): Payer: Medicare HMO | Admitting: Internal Medicine

## 2017-04-30 DIAGNOSIS — F0280 Dementia in other diseases classified elsewhere without behavioral disturbance: Secondary | ICD-10-CM

## 2017-04-30 DIAGNOSIS — R7881 Bacteremia: Secondary | ICD-10-CM | POA: Diagnosis not present

## 2017-04-30 DIAGNOSIS — Z992 Dependence on renal dialysis: Secondary | ICD-10-CM

## 2017-04-30 DIAGNOSIS — N186 End stage renal disease: Secondary | ICD-10-CM | POA: Diagnosis not present

## 2017-04-30 DIAGNOSIS — I10 Essential (primary) hypertension: Secondary | ICD-10-CM | POA: Diagnosis not present

## 2017-04-30 DIAGNOSIS — E1121 Type 2 diabetes mellitus with diabetic nephropathy: Secondary | ICD-10-CM | POA: Diagnosis not present

## 2017-04-30 DIAGNOSIS — B955 Unspecified streptococcus as the cause of diseases classified elsewhere: Secondary | ICD-10-CM

## 2017-04-30 DIAGNOSIS — K22719 Barrett's esophagus with dysplasia, unspecified: Secondary | ICD-10-CM

## 2017-04-30 DIAGNOSIS — F320 Major depressive disorder, single episode, mild: Secondary | ICD-10-CM

## 2017-04-30 DIAGNOSIS — Z941 Heart transplant status: Secondary | ICD-10-CM

## 2017-04-30 DIAGNOSIS — E034 Atrophy of thyroid (acquired): Secondary | ICD-10-CM

## 2017-04-30 NOTE — Progress Notes (Signed)
: Provider:  Noah Delaine. Sheppard Coil, MD Location:  [place] Room Number: 100P Place of Service:  SNF (31)  PCP: Smothers, Andree Elk, NP Patient Care Team: Smothers, Andree Elk, NP as PCP - General (Nurse Practitioner) Lelon Perla, MD as Consulting Physician (Cardiology)  Extended Emergency Contact Information Primary Emergency Contact: Suber,Judy Address: [address] [place], [place] [address] [place] Phone: [phone] Mobile Phone: [phone] Relation: Spouse Secondary Emergency Contact: Desaulniers,Chris Address: [address] [place], [place] [address] [place] Phone: [phone] Relation: Son     Allergies: Norvasc [amlodipine besylate]  Chief Complaint  Patient presents with  . New Admit To SNF    Admit to Facility    HPI: Patient is 76 y.o. male with history of heart transplant, ESRD, CAD, T2DM, HTN, HLD, and dementia who presented to the ED due to report of positive blood cultures. While visiting [place], [place] last week, he was evaluated at urgent care for a left arm wound on 6/1. He was prescribed augmentin, and topical mupirocin ointment and sent home. That night he became nonverbal, not able to follow commands, so he was taken to [facility] in [place], [place] in the very early morning hours of 6/2. Work up there included CT head, imaging of his abdomen, and blood work including cultures. Per his wife, from whom all history is obtained due to patient's confusion, they left the ED in the morning and drove home to [place]. He had a low grade fever but was acting at his baseline without cough, dyspnea, chest pain or other complaints, when they received a call from the ED reporting a positive blood culture. They were referred to the ED where work up showed WBC 18.2 and CXR demonstrated multiple infiltrates concerning for HCAP.Pt was [facility]7.Vancomycin and zosyn were administered.. ID was consulted and after blood cultures remained negative on 6/5, antibiotics were changed to unasyn. He has remained afebrile with no growth on blood cultures drawn here. Per ID recommendations, he will continue augmentin for 5 days. Pt is to being admited to SNF with generalized weakness for OT/PT. While at SNF pt will be followed for h/o heart transplant, tx with cyclosporin and cellcept, HTN, tx with metoprolol and hydralazine, and hypothyroididm, tx with synthroid.  Past Medical History:  Diagnosis Date  . Anemia   . Cancer ([place])    SKIN  . CKD III-IV    a. since transplant in 2001 with significant worsening since 06/2013.  Marland Kitchen Coronary artery disease   . Dementia    mild  . Diabetes mellitus without complication (HCC)    FASTING CBG 90S  . Headache(784.0)   . Heart transplanted ([place])    a. 1995 CABG x 4 in [place], [place];  b. 2001 Developed CHF;  c. 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 10/2000 Cardiac transplant @ [facility], [place];  e. Historically nl Bx and stress testing.  Last stress test ~ 5 yrs ago.  Marland Kitchen Hernia of abdominal wall   . History of blood transfusion   . History of stroke   . Hypercalcemia    a. 06/2013 Hospitalized in [place].  Marland Kitchen Hyperlipidemia   . Hypertension    a. Long h/o HTN, came off of all meds following transplant/wt loss-->meds resumed 06/2013, difficult to  control since.  . Myocardial infarction ([place])    PRIOR TO HEART TRANSPLANT  . Orthostatic dizziness   . Orthostatic hypotension    a. Has tried support hose and compression stockings - prefers not to wear.  . Shortness of breath    EXERTION  . Stroke ([place]) 06/2013   NO RESIDUAL - WAS CONFUSED AT TIME - THAT HAS RESOLVED    Past Surgical History:  Procedure Laterality Date  . BASCILIC VEIN TRANSPOSITION Left 07/06/2014   Procedure: [place];  Surgeon: Rosetta Posner, MD;  Location: [facility];  Service: Vascular;  Laterality: Left;  .  CARDIAC SURGERY    . CATARACT EXTRACTION    . CORONARY ARTERY BYPASS GRAFT    . CRANIOTOMY Right 05/04/2016   Procedure: Right Craniotomy for Evacuation of Subdural Hematoma;  Surgeon: Earnie Larsson, MD;  Location: [facility] NEURO ORS;  Service: Neurosurgery;  Laterality: Right;  . EYE SURGERY Bilateral    CATARACTS  . HEART TRANSPLANT    . PERIPHERAL VASCULAR CATHETERIZATION N/A 09/06/2016   Procedure: Fistulagram - left arm;  Surgeon: Waynetta Sandy, MD;  Location: [facility] CV LAB;  Service: Cardiovascular;  Laterality: N/A;  . PERIPHERAL VASCULAR CATHETERIZATION Left 09/06/2016   Procedure: Peripheral Vascular Intervention;  Surgeon: Waynetta Sandy, MD;  Location: [facility] CV LAB;  Service: Cardiovascular;  Laterality: Left;  UPPER ARM COVERED STENT  . SKIN GRAFT FULL THICKNESS LEG     cat scratch that did not heal  . VASECTOMY      Allergies as of 04/30/2017      Reactions   Norvasc [amlodipine Besylate] Swelling, Other (See Comments)   Swelling of the lower body       Medication List       Accurate as of 04/30/17  9:44 AM. Always use your most recent med list.          acetaminophen 325 MG tablet Commonly known as:  TYLENOL Take 2 tablets (650 mg total) by mouth every 6 (six) hours as needed for mild pain (or Fever >/= 101).   amoxicillin-clavulanate 500-125 MG tablet Commonly known as:  AUGMENTIN Take 1 tablet (500 mg total) by mouth at bedtime.   aspirin EC 81 MG tablet Take 81 mg by mouth at bedtime.   calcium acetate 667 MG capsule Commonly known as:  PHOSLO Take 1 capsule (667 mg total) by mouth 3 (three) times daily with meals.   cholecalciferol 1000 units tablet Commonly known as:  VITAMIN D Take 1,000 Units by mouth daily.   cycloSPORINE modified 25 MG capsule Commonly known as:  NEORAL Take 50-75 mg by mouth See admin instructions. 75 mg in the morning and 50 mg in the evening   diphenoxylate-atropine 2.5-0.025 MG tablet Commonly known  as:  LOMOTIL Take 1 tablet by mouth 4 (four) times daily as needed for diarrhea or loose stools.   donepezil 10 MG tablet Commonly known as:  ARICEPT Take 10 mg by mouth at bedtime.   feeding supplement (NEPRO CARB STEADY) Liqd Take 237 mLs by mouth 2 (two) times daily between meals.   FLUoxetine 20 MG capsule Commonly known as:  PROZAC Take 20 mg by mouth daily.   fluticasone 50 MCG/ACT nasal spray Commonly known as:  FLONASE Place 2 sprays into both nostrils at bedtime as needed for allergies.   hydrALAZINE 25 MG tablet Commonly known as:  APRESOLINE Take 1 tablet (25 mg total) by mouth 3 (three) times daily.   lansoprazole  30 MG capsule Commonly known as:  PREVACID Take 30 mg by mouth 2 (two) times daily before a meal.   levothyroxine 75 MCG tablet Commonly known as:  SYNTHROID, LEVOTHROID Take 75 mcg by mouth daily.   metoprolol tartrate 25 MG tablet Commonly known as:  LOPRESSOR Take 2 tablets (50 mg total) by mouth 2 (two) times daily.   multivitamin Tabs tablet Take 1 tablet by mouth daily.   mycophenolate 250 MG capsule Commonly known as:  CELLCEPT Take 250-500 mg by mouth daily. Take 500 mg every morning and take 250 mg every evening on Sunday, Monday, Wednesday and Friday then take 500 mg every evening on Tuesday Thursday and Saturday   pravastatin 10 MG tablet Commonly known as:  PRAVACHOL Take 10 mg by mouth at bedtime.   PRO-BIOTIC BLEND Caps Take 1 capsule by mouth daily.   vitamin B-12 1000 MCG tablet Commonly known as:  CYANOCOBALAMIN Take 1,000 mcg by mouth daily with breakfast.       No orders of the defined types were placed in this encounter.   Immunization History  Administered Date(s) Administered  . Influenza, Seasonal, Injecte, Preservative Fre 08/24/2014, 09/01/2015  . Influenza,inj,Quad PF,36+ Mos 08/24/2014, 09/01/2015, 08/09/2016  . PPD Test 04/29/2017  . Pneumococcal Conjugate-13 10/20/2014, 02/05/2015  . Pneumococcal  Polysaccharide-23 08/28/2006  . Pneumococcal-Unspecified 08/28/2006  . Tdap 02/04/2010    Social History  Substance Use Topics  . Smoking status: Never Smoker  . Smokeless tobacco: Never Used  . Alcohol use No    Family history is   Family History  Problem Relation Age of Onset  . Heart attack Father        died @ 25  . Diabetes Father   . Heart attack Mother   . Diabetes Mother   . Kidney disease Maternal Aunt       Review of Systems  DATA OBTAINED: from patient, nurse GENERAL:  no fevers, fatigue, appetite changes SKIN: No itching, or rash EYES: No eye pain, redness, discharge EARS: No earache, tinnitus, change in hearing NOSE: No congestion, drainage or bleeding  MOUTH/THROAT: No mouth or tooth pain, No sore throat RESPIRATORY: No cough, wheezing, SOB CARDIAC: No chest pain, palpitations, lower extremity edema  GI: No abdominal pain, No N/V/D or constipation, No heartburn or reflux  GU: No dysuria, frequency or urgency, or incontinence  MUSCULOSKELETAL: No unrelieved bone/joint pain NEUROLOGIC: No headache, dizziness or focal weakness PSYCHIATRIC: No c/o anxiety or sadness   Vitals:   04/30/17 0924  BP: (!) 115/54  Pulse: 66  Resp: 18  Temp: 98.1 F (36.7 C)    SpO2 Readings from Last 1 Encounters:  04/26/17 100%   Body mass index is 19.39 kg/m.     Physical Exam  GENERAL APPEARANCE: Alert, conversant,  No acute distress.  SKIN: No diaphoresis rash HEAD: Normocephalic, atraumatic  EYES: Conjunctiva/lids clear. Pupils round, reactive. EOMs intact.  EARS: External exam WNL, canals clear. Hearing grossly normal.  NOSE: No deformity or discharge.  MOUTH/THROAT: Lips w/o lesions  RESPIRATORY: Breathing is even, unlabored. Lung sounds are diffusely decreased and clear   CARDIOVASCULAR: Heart RRR no murmurs, rubs or gallops. No peripheral edema.   GASTROINTESTINAL: Abdomen is soft, non-tender, not distended w/ normal bowel sounds. GENITOURINARY:  Bladder non tender, not distended  MUSCULOSKELETAL: No abnormal joints or musculature NEUROLOGIC:  Cranial nerves 2-12 grossly intact. Moves all extremities  PSYCHIATRIC: Mood and affect appropriate to situation, no behavioral issues  Patient Active Problem List  Diagnosis Date Noted  . Malnutrition of moderate degree 04/25/2017  . Bacteremia 04/22/2017  . Acute metabolic encephalopathy 25/95/6387  . Volume overload 02/03/2017  . Generalized weakness 08/22/2016  . Urinary tract infection   . Syncope and collapse   . Syncope 08/07/2016  . Faintness   . Uncontrolled hypertension   . Acute blood loss anemia   . Cardiomyopathy ([place])   . Controlled diabetes mellitus type 2 with complications ([place])   . 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 Traumatic subdural hematoma ([place]) 05/04/2016  . 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 Subdural hematoma ([place]) 05/02/2016  . CKD (chronic kidney disease)   . Protein-calorie malnutrition, severe ([place]) 04/23/2015  . Anemia of chronic disease 04/23/2015  . Depression 04/23/2015  . UTI (urinary tract infection) 04/22/2015  . Barrett's esophagus 03/22/2015  . ESRD on dialysis ([place]) 10/12/2014  . DM (diabetes mellitus), type 2 with renal complications ([place]) 56/43/3295  . Hypothyroidism 10/06/2014  . Heart transplanted ([place])   . Hypercholesterolemia 06/20/2014  . 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[place]) 06/20/2014      Labs reviewed: Basic Metabolic Panel:    Component Value Date/Time   NA 132 (L) 04/26/2017 0728   NA 135 (A) 04/24/2017   K 3.0 (L) 04/26/2017 0728   CL 91 (L) 04/26/2017 0728   CO2 25 04/26/2017 0728   GLUCOSE 142 (H) 04/26/2017 0728   BUN 40 (H) 04/26/2017 0728   BUN 42 (A) 04/24/2017   CREATININE 5.00 (H) 04/26/2017 0728   CALCIUM 8.0 (L) 04/26/2017 0728   PROT 6.3 (L) 04/22/2017 0145   ALBUMIN 2.4 (L) 04/26/2017 0728   AST 23 04/22/2017 0145   ALT 21 04/22/2017 0145     ALKPHOS 115 04/22/2017 0145   BILITOT 0.5 04/22/2017 0145   GFRNONAA 10 (L) 04/26/2017 0728   GFRAA 12 (L) 04/26/2017 0728     Recent Labs  05/02/16 1930  05/04/16 0239  08/24/16 0748  04/23/17 1884 04/24/17 04/24/17 0539 04/26/17 0728  NA  --   < > 135  < > 136  < > 133* 135* 135 132*  K  --   < > 4.2  < > 3.8  < > 5.6* 4.2 4.2 3.0*  CL  --   < > 96*  < > 99*  < > 91*  --  89* 91*  CO2  --   < > 28  < > 27  < > 25  --  27 25  GLUCOSE  --   < > 123*  < > 80  < > 94  --  136* 142*  BUN  --   < > 16  < > 19  < > 86* 42* 42* 40*  CREATININE  --   < > 4.58*  < > 4.07*  < > 7.43* 4.5* 4.47* 5.00*  CALCIUM  --   < > 8.5*  < > 8.4*  < > 8.0*  --  8.2* 8.0*  MG 2.0  --  2.0  --   --   --   --   --   --   --   PHOS  --   < >  --   < > 4.4  --  5.2*  --   --  3.8  < > = values in this interval not displayed. Liver Function Tests:  Recent Labs  02/03/17 1532 02/04/17 0725 04/22/17 0145 04/23/17 0624 04/26/17 0728  AST  17 18 23   --   --   ALT 16* 15* 21  --   --   ALKPHOS 122 122 115  --   --   BILITOT 0.8 0.6 0.5  --   --   PROT 5.4* 5.8* 6.3*  --   --   ALBUMIN 3.2* 3.1* 3.4* 2.6* 2.4*   No results for input(s): LIPASE, AMYLASE in the last 8760 hours. No results for input(s): AMMONIA in the last 8760 hours. CBC:  Recent Labs  04/24/17 0539 04/25/17 04/25/17 0555 04/26/17 04/26/17 0427  WBC 13.4* 10.9 10.9* 10.0 10.0  NEUTROABS 10.5*  --  8.1*  --  6.5  HGB 10.7* 10.8* 10.8*  --  9.7*  HCT 33.3* 35* 34.8*  --  31.3*  MCV 96.5  --  97.2  --  98.4  PLT 195 204 204  --  209   Lipid No results for input(s): CHOL, HDL, LDLCALC, TRIG in the last 8760 hours.  Cardiac Enzymes:  Recent Labs  05/03/16 0554 06/27/16 1935 08/07/16 1317 08/22/16 1320  CKTOTAL  --   --   --  37*  TROPONINI <0.03 <0.03 <0.03  --    BNP: No results for input(s): BNP in the last 8760 hours. No results found for: [facility] Lab Results  Component Value Date   HGBA1C 5.5 05/02/2016    Lab Results  Component Value Date   TSH 1.405 05/02/2016   Lab Results  Component Value Date   HBZJIRCV89 381 08/25/2014   Lab Results  Component Value Date   FOLATE 7.5 08/25/2014   Lab Results  Component Value Date   IRON 38 (L) 08/25/2014   TIBC 143 (L) 08/25/2014   FERRITIN 612 (H) 08/25/2014    Imaging and Procedures obtained prior to SNF admission: Dg Chest 2 View  Result Date: 04/22/2017 CLINICAL DATA:  Rash, positive blood cultures EXAM: CHEST  2 VIEW COMPARISON:  02/04/2017, 11/09/2016 FINDINGS: Post sternotomy changes. Ill-defined opacities in the peripheral right upper lobe, right base and left mid lung. No pleural effusion. Mild cardiomegaly with aortic atherosclerosis. No pneumothorax. Vascular stent in the left axilla. IMPRESSION: 1. Ill-defined multifocal opacity in the right upper lobe, right base and left upper lobe suspicious for infiltrates. 2. Cardiomegaly without edema. Electronically Signed   By: Donavan Foil M.D.   On: 04/22/2017 02:07     Not all labs, radiology exams or other studies done during hospitalization come through on my EPIC note; however they are reviewed by me.    Assessment and Plan  STREPTOCCOCAL BACTEREMIA - Per family report, cultures drawn at [facility] ED were said to be positive for streptococcal bacteria within 12 hours. Wife not sure how many bottles. In patient on immunosuppressants for heart transplant. +Leukocytosis without other SIRS.  - ROI sent to [facility] 6/5, confirmation that this was received was obtained, still awaiting culture reports, though leukocytosis has resolved, so organism likely sensitive;Vanc/zosyn 6/3 - 6/5 >unasyn 6/5 - 6/6 > augmentin x 5 more days per ID recommendations.  - Follow up blood cultures drawn at admission here, NGTD.  - Echocardiogram performed 6/6 > no evidence of vegetation.  SNF - admitted for OT/PT; cont augmentin for 5 more days  ESRD-TTS schedule. Schedule  thrown off by recent vacation: Last got HD 6/1, planned to return 6/4, then back to TTS.;Back on schedule 6/5, next 6/7. EDW 62kg per nephrology SNF - pt refuse dialysis on 6/9;cont phoslo 667 mg TID  S/P  HEART TRANSPLANT - 2001, [facility] [place] SNF - no problems - cont cyclosporin 75 mg qam and 50 mg qHS and cellcept 500 mg am and qpm on TTS and 250 mg qpm other days  DM2 - Diet-controlled, HbA1c 5.5% in 2017 SNF -  Cont  Low carb diet  HTN, uncontrolled SNF - cont metoprolol 50 mg BID and hydralazine 25 mg TID  HYPOTHYROIDISM SNF -  Controlled, TSH 1.4, cont synthroid 75 mcg daily  DEPRESSION/ DEMENTIA-Wife reports about 1 year of increasing dependence on her for ADL's, general mental decline SNF - cont  aricept 10 mg daily and prozac20 mg daily  BARRETTS ESOPHAGUS - No dysphagia per [facility] SNF - cont prevacid 30 mg BID   Time spent > 45 min;> 50% of time with patient was spent reviewing records, labs, tests and studies, counseling and developing plan of care  Webb Silversmith D. Sheppard Coil, MD

## 2017-05-01 ENCOUNTER — Inpatient Hospital Stay (HOSPITAL_COMMUNITY): Payer: Medicare HMO

## 2017-05-01 ENCOUNTER — Emergency Department (HOSPITAL_COMMUNITY): Payer: Medicare HMO

## 2017-05-01 ENCOUNTER — Encounter: Payer: Self-pay | Admitting: Internal Medicine

## 2017-05-01 ENCOUNTER — Inpatient Hospital Stay (HOSPITAL_COMMUNITY)
Admission: EM | Admit: 2017-05-01 | Discharge: 2017-05-08 | DRG: 166 | Disposition: A | Discharge status: Skilled Nursing Facility | Payer: Medicare HMO | Attending: Family Medicine | Admitting: Family Medicine

## 2017-05-01 ENCOUNTER — Encounter (HOSPITAL_COMMUNITY): Payer: Self-pay | Admitting: *Deleted

## 2017-05-01 ENCOUNTER — Encounter (HOSPITAL_COMMUNITY): Payer: Self-pay | Admitting: Emergency Medicine

## 2017-05-01 DIAGNOSIS — I25811 Atherosclerosis of native coronary artery of transplanted heart without angina pectoris: Secondary | ICD-10-CM | POA: Diagnosis present

## 2017-05-01 DIAGNOSIS — B9562 Methicillin resistant Staphylococcus aureus infection as the cause of diseases classified elsewhere: Secondary | ICD-10-CM | POA: Diagnosis present

## 2017-05-01 DIAGNOSIS — Z8249 Family history of ischemic heart disease and other diseases of the circulatory system: Secondary | ICD-10-CM | POA: Diagnosis not present

## 2017-05-01 DIAGNOSIS — Z941 Heart transplant status: Secondary | ICD-10-CM | POA: Diagnosis not present

## 2017-05-01 DIAGNOSIS — Z681 Body mass index (BMI) 19 or less, adult: Secondary | ICD-10-CM | POA: Diagnosis not present

## 2017-05-01 DIAGNOSIS — E1165 Type 2 diabetes mellitus with hyperglycemia: Secondary | ICD-10-CM | POA: Diagnosis present

## 2017-05-01 DIAGNOSIS — R911 Solitary pulmonary nodule: Secondary | ICD-10-CM | POA: Diagnosis present

## 2017-05-01 DIAGNOSIS — E039 Hypothyroidism, unspecified: Secondary | ICD-10-CM | POA: Diagnosis present

## 2017-05-01 DIAGNOSIS — R7881 Bacteremia: Secondary | ICD-10-CM | POA: Diagnosis not present

## 2017-05-01 DIAGNOSIS — Y95 Nosocomial condition: Secondary | ICD-10-CM | POA: Diagnosis present

## 2017-05-01 DIAGNOSIS — Z7982 Long term (current) use of aspirin: Secondary | ICD-10-CM | POA: Diagnosis not present

## 2017-05-01 DIAGNOSIS — J15212 Pneumonia due to Methicillin resistant Staphylococcus aureus: Principal | ICD-10-CM | POA: Diagnosis present

## 2017-05-01 DIAGNOSIS — F32A Depression, unspecified: Secondary | ICD-10-CM | POA: Diagnosis present

## 2017-05-01 DIAGNOSIS — Y838 Other surgical procedures as the cause of abnormal reaction of the patient, or of later complication, without mention of misadventure at the time of the procedure: Secondary | ICD-10-CM | POA: Diagnosis present

## 2017-05-01 DIAGNOSIS — N186 End stage renal disease: Secondary | ICD-10-CM

## 2017-05-01 DIAGNOSIS — Z79899 Other long term (current) drug therapy: Secondary | ICD-10-CM

## 2017-05-01 DIAGNOSIS — K22719 Barrett's esophagus with dysplasia, unspecified: Secondary | ICD-10-CM | POA: Diagnosis not present

## 2017-05-01 DIAGNOSIS — L8990 Pressure ulcer of unspecified site, unspecified stage: Secondary | ICD-10-CM | POA: Insufficient documentation

## 2017-05-01 DIAGNOSIS — K2270 Barrett's esophagus without dysplasia: Secondary | ICD-10-CM | POA: Diagnosis present

## 2017-05-01 DIAGNOSIS — J189 Pneumonia, unspecified organism: Secondary | ICD-10-CM | POA: Diagnosis present

## 2017-05-01 DIAGNOSIS — I120 Hypertensive chronic kidney disease with stage 5 chronic kidney disease or end stage renal disease: Secondary | ICD-10-CM | POA: Diagnosis present

## 2017-05-01 DIAGNOSIS — E208 Other hypoparathyroidism: Secondary | ICD-10-CM | POA: Diagnosis not present

## 2017-05-01 DIAGNOSIS — T82898A Other specified complication of vascular prosthetic devices, implants and grafts, initial encounter: Secondary | ICD-10-CM | POA: Diagnosis present

## 2017-05-01 DIAGNOSIS — Z992 Dependence on renal dialysis: Secondary | ICD-10-CM

## 2017-05-01 DIAGNOSIS — Z841 Family history of disorders of kidney and ureter: Secondary | ICD-10-CM | POA: Diagnosis not present

## 2017-05-01 DIAGNOSIS — E785 Hyperlipidemia, unspecified: Secondary | ICD-10-CM | POA: Diagnosis present

## 2017-05-01 DIAGNOSIS — Z888 Allergy status to other drugs, medicaments and biological substances status: Secondary | ICD-10-CM

## 2017-05-01 DIAGNOSIS — F0390 Unspecified dementia without behavioral disturbance: Secondary | ICD-10-CM | POA: Diagnosis present

## 2017-05-01 DIAGNOSIS — E440 Moderate protein-calorie malnutrition: Secondary | ICD-10-CM | POA: Diagnosis present

## 2017-05-01 DIAGNOSIS — E1129 Type 2 diabetes mellitus with other diabetic kidney complication: Secondary | ICD-10-CM | POA: Diagnosis present

## 2017-05-01 DIAGNOSIS — D638 Anemia in other chronic diseases classified elsewhere: Secondary | ICD-10-CM | POA: Diagnosis present

## 2017-05-01 DIAGNOSIS — I2510 Atherosclerotic heart disease of native coronary artery without angina pectoris: Secondary | ICD-10-CM | POA: Diagnosis not present

## 2017-05-01 DIAGNOSIS — E43 Unspecified severe protein-calorie malnutrition: Secondary | ICD-10-CM | POA: Diagnosis present

## 2017-05-01 DIAGNOSIS — R531 Weakness: Secondary | ICD-10-CM

## 2017-05-01 DIAGNOSIS — L98499 Non-pressure chronic ulcer of skin of other sites with unspecified severity: Secondary | ICD-10-CM | POA: Diagnosis present

## 2017-05-01 DIAGNOSIS — F320 Major depressive disorder, single episode, mild: Secondary | ICD-10-CM

## 2017-05-01 DIAGNOSIS — Z974 Presence of external hearing-aid: Secondary | ICD-10-CM

## 2017-05-01 DIAGNOSIS — E1122 Type 2 diabetes mellitus with diabetic chronic kidney disease: Secondary | ICD-10-CM | POA: Diagnosis present

## 2017-05-01 DIAGNOSIS — A4902 Methicillin resistant Staphylococcus aureus infection, unspecified site: Secondary | ICD-10-CM

## 2017-05-01 DIAGNOSIS — J851 Abscess of lung with pneumonia: Secondary | ICD-10-CM | POA: Diagnosis not present

## 2017-05-01 DIAGNOSIS — K439 Ventral hernia without obstruction or gangrene: Secondary | ICD-10-CM | POA: Diagnosis present

## 2017-05-01 DIAGNOSIS — E7800 Pure hypercholesterolemia, unspecified: Secondary | ICD-10-CM | POA: Diagnosis present

## 2017-05-01 DIAGNOSIS — F329 Major depressive disorder, single episode, unspecified: Secondary | ICD-10-CM | POA: Diagnosis present

## 2017-05-01 DIAGNOSIS — Z833 Family history of diabetes mellitus: Secondary | ICD-10-CM | POA: Diagnosis not present

## 2017-05-01 DIAGNOSIS — N2581 Secondary hyperparathyroidism of renal origin: Secondary | ICD-10-CM | POA: Diagnosis present

## 2017-05-01 DIAGNOSIS — Z85828 Personal history of other malignant neoplasm of skin: Secondary | ICD-10-CM

## 2017-05-01 DIAGNOSIS — Z794 Long term (current) use of insulin: Secondary | ICD-10-CM | POA: Diagnosis not present

## 2017-05-01 DIAGNOSIS — Z22322 Carrier or suspected carrier of Methicillin resistant Staphylococcus aureus: Secondary | ICD-10-CM

## 2017-05-01 DIAGNOSIS — J984 Other disorders of lung: Secondary | ICD-10-CM

## 2017-05-01 DIAGNOSIS — Z95828 Presence of other vascular implants and grafts: Secondary | ICD-10-CM

## 2017-05-01 DIAGNOSIS — Z8673 Personal history of transient ischemic attack (TIA), and cerebral infarction without residual deficits: Secondary | ICD-10-CM

## 2017-05-01 LAB — CREATININE, SERUM
CREATININE: 5.43 mg/dL — AB (ref 0.61–1.24)
GFR calc Af Amer: 11 mL/min — ABNORMAL LOW (ref >60)
GFR calc non Af Amer: 9 mL/min — ABNORMAL LOW (ref >60)

## 2017-05-01 LAB — RENAL FUNCTION PANEL
ALBUMIN: 2.3 g/dL — AB (ref 3.5–5.0)
ANION GAP: 12 (ref 5–15)
BUN: 34 mg/dL — AB (ref 6–20)
CO2: 29 mmol/L (ref 22–32)
Calcium: 8.2 mg/dL — ABNORMAL LOW (ref 8.9–10.3)
Chloride: 94 mmol/L — ABNORMAL LOW (ref 101–111)
Creatinine, Ser: 4.99 mg/dL — ABNORMAL HIGH (ref 0.61–1.24)
GFR, EST AFRICAN AMERICAN: 12 mL/min — AB (ref >60)
GFR, EST NON AFRICAN AMERICAN: 10 mL/min — AB (ref >60)
Glucose, Bld: 165 mg/dL — ABNORMAL HIGH (ref 65–99)
PHOSPHORUS: 3.1 mg/dL (ref 2.5–4.6)
Potassium: 3.7 mmol/L (ref 3.5–5.1)
Sodium: 135 mmol/L (ref 135–145)

## 2017-05-01 LAB — MAGNESIUM: MAGNESIUM: 2 mg/dL (ref 1.7–2.4)

## 2017-05-01 LAB — CBC
HCT: 27.2 % — ABNORMAL LOW (ref 39.0–52.0)
HEMATOCRIT: 31.6 % — AB (ref 39.0–52.0)
HEMOGLOBIN: 10 g/dL — AB (ref 13.0–17.0)
HEMOGLOBIN: 8.6 g/dL — AB (ref 13.0–17.0)
MCH: 30.1 pg (ref 26.0–34.0)
MCH: 30.3 pg (ref 26.0–34.0)
MCHC: 31.6 g/dL (ref 30.0–36.0)
MCHC: 31.6 g/dL (ref 30.0–36.0)
MCV: 95.2 fL (ref 78.0–100.0)
MCV: 95.8 fL (ref 78.0–100.0)
Platelets: 347 10*3/uL (ref 150–400)
Platelets: 389 10*3/uL (ref 150–400)
RBC: 3.32 MIL/uL — AB (ref 4.22–5.81)
RBC: 2.84 MIL/uL — AB (ref 4.22–5.81)
RDW: 14.6 % (ref 11.5–15.5)
RDW: 14.7 % (ref 11.5–15.5)
WBC: 9.7 10*3/uL (ref 4.0–10.5)
WBC: 10.9 10*3/uL — AB (ref 4.0–10.5)

## 2017-05-01 LAB — CBC AND DIFFERENTIAL
HCT: 30 — AB (ref 41–53)
Hemoglobin: 9.8 — AB (ref 13.5–17.5)
Platelets: 333 (ref 150–399)
WBC: 10.8

## 2017-05-01 LAB — BASIC METABOLIC PANEL
BUN: 80 — AB (ref 4–21)
Creatinine: 9.4 — AB (ref 0.6–1.3)
Glucose: 111
POTASSIUM: 4.9 (ref 3.4–5.3)
Potassium: 3.7 (ref 3.4–5.3)
SODIUM: 132 — AB (ref 137–147)

## 2017-05-01 LAB — TSH: TSH: 1.951 u[IU]/mL (ref 0.350–4.500)

## 2017-05-01 LAB — I-STAT CG4 LACTIC ACID, ED: Lactic Acid, Venous: 1.29 mmol/L (ref 0.5–1.9)

## 2017-05-01 LAB — PROCALCITONIN: PROCALCITONIN: 0.45 ng/mL

## 2017-05-01 MED ORDER — CEFEPIME HCL 1 G IJ SOLR
1.0000 g | Freq: Once | INTRAMUSCULAR | Status: AC
  Administered 2017-05-01: 1 g via INTRAVENOUS
  Filled 2017-05-01: qty 1

## 2017-05-01 MED ORDER — CYCLOSPORINE MODIFIED (NEORAL) 25 MG PO CAPS
50.0000 mg | ORAL_CAPSULE | Freq: Every day | ORAL | Status: DC
  Administered 2017-05-02 – 2017-05-08 (×7): 50 mg via ORAL
  Filled 2017-05-01 (×7): qty 2

## 2017-05-01 MED ORDER — MYCOPHENOLATE MOFETIL 250 MG PO CAPS: 250.0000 mg | ORAL_CAPSULE | Freq: Every day | ORAL | Status: DC

## 2017-05-01 MED ORDER — MYCOPHENOLATE MOFETIL 250 MG PO CAPS
500.0000 mg | ORAL_CAPSULE | Freq: Every day | ORAL | Status: DC
  Administered 2017-05-02 – 2017-05-08 (×7): 500 mg via ORAL
  Filled 2017-05-01 (×7): qty 2

## 2017-05-01 MED ORDER — CYCLOSPORINE MODIFIED (NEORAL) 25 MG PO CAPS: 50.0000 mg | ORAL_CAPSULE | ORAL | Status: DC

## 2017-05-01 MED ORDER — FLUOXETINE HCL 20 MG PO CAPS
20.0000 mg | ORAL_CAPSULE | Freq: Every day | ORAL | Status: DC
Start: 2017-05-02 — End: 2017-05-09
  Administered 2017-05-02 – 2017-05-08 (×7): 20 mg via ORAL
  Filled 2017-05-01 (×7): qty 1

## 2017-05-01 MED ORDER — LEVALBUTEROL HCL 0.63 MG/3ML IN NEBU: 0.6300 mg | INHALATION_SOLUTION | Freq: Four times a day (QID) | RESPIRATORY_TRACT | Status: DC | PRN

## 2017-05-01 MED ORDER — ASPIRIN EC 81 MG PO TBEC
81.0000 mg | DELAYED_RELEASE_TABLET | Freq: Every day | ORAL | Status: DC
  Administered 2017-05-02 – 2017-05-07 (×6): 81 mg via ORAL
  Filled 2017-05-01 (×7): qty 1

## 2017-05-01 MED ORDER — VITAMIN B-12 1000 MCG PO TABS
1000.0000 ug | ORAL_TABLET | Freq: Every day | ORAL | Status: DC
  Administered 2017-05-02 – 2017-05-08 (×7): 1000 ug via ORAL
  Filled 2017-05-01 (×7): qty 1

## 2017-05-01 MED ORDER — ONDANSETRON HCL 4 MG/2ML IJ SOLN: 4.0000 mg | Freq: Four times a day (QID) | INTRAMUSCULAR | Status: DC | PRN

## 2017-05-01 MED ORDER — DEXTROSE 5 % IV SOLN
1.0000 g | INTRAVENOUS | Status: DC
  Filled 2017-05-01: qty 1

## 2017-05-01 MED ORDER — CALCIUM ACETATE (PHOS BINDER) 667 MG PO CAPS
667.0000 mg | ORAL_CAPSULE | Freq: Three times a day (TID) | ORAL | Status: DC
  Administered 2017-05-02 – 2017-05-08 (×15): 667 mg via ORAL
  Filled 2017-05-01 (×17): qty 1

## 2017-05-01 MED ORDER — LEVOTHYROXINE SODIUM 75 MCG PO TABS
75.0000 ug | ORAL_TABLET | Freq: Every day | ORAL | Status: DC
  Administered 2017-05-02: 75 ug via ORAL
  Filled 2017-05-01: qty 1

## 2017-05-01 MED ORDER — VANCOMYCIN HCL 10 G IV SOLR
1250.0000 mg | Freq: Once | INTRAVENOUS | Status: AC
  Administered 2017-05-01: 1250 mg via INTRAVENOUS
  Filled 2017-05-01 (×2): qty 1250

## 2017-05-01 MED ORDER — HYDRALAZINE HCL 25 MG PO TABS
25.0000 mg | ORAL_TABLET | Freq: Three times a day (TID) | ORAL | Status: DC
  Administered 2017-05-02 – 2017-05-08 (×15): 25 mg via ORAL
  Filled 2017-05-01 (×17): qty 1

## 2017-05-01 MED ORDER — HEPARIN SODIUM (PORCINE) 5000 UNIT/ML IJ SOLN
5000.0000 [IU] | Freq: Three times a day (TID) | INTRAMUSCULAR | Status: DC
  Administered 2017-05-02 – 2017-05-03 (×5): 5000 [IU] via SUBCUTANEOUS
  Filled 2017-05-01 (×5): qty 1

## 2017-05-01 MED ORDER — DONEPEZIL HCL 10 MG PO TABS
10.0000 mg | ORAL_TABLET | Freq: Every day | ORAL | Status: DC
  Administered 2017-05-02 – 2017-05-07 (×6): 10 mg via ORAL
  Filled 2017-05-01 (×7): qty 1

## 2017-05-01 MED ORDER — PANTOPRAZOLE SODIUM 20 MG PO TBEC
20.0000 mg | DELAYED_RELEASE_TABLET | Freq: Every day | ORAL | Status: DC
  Administered 2017-05-02 – 2017-05-08 (×7): 20 mg via ORAL
  Filled 2017-05-01 (×8): qty 1

## 2017-05-01 MED ORDER — METOPROLOL TARTRATE 25 MG PO TABS
25.0000 mg | ORAL_TABLET | Freq: Two times a day (BID) | ORAL | Status: DC
  Administered 2017-05-02 – 2017-05-08 (×12): 25 mg via ORAL
  Filled 2017-05-01 (×13): qty 1

## 2017-05-01 MED ORDER — MYCOPHENOLATE MOFETIL 250 MG PO CAPS
250.0000 mg | ORAL_CAPSULE | ORAL | Status: DC
  Filled 2017-05-01 (×2): qty 1

## 2017-05-01 MED ORDER — CYCLOSPORINE MODIFIED (NEORAL) 25 MG PO CAPS
75.0000 mg | ORAL_CAPSULE | Freq: Every day | ORAL | Status: DC
  Administered 2017-05-02 – 2017-05-07 (×5): 75 mg via ORAL
  Filled 2017-05-01 (×9): qty 3

## 2017-05-01 MED ORDER — ACETAMINOPHEN 650 MG RE SUPP: 650.0000 mg | Freq: Four times a day (QID) | RECTAL | Status: DC | PRN

## 2017-05-01 MED ORDER — PRAVASTATIN SODIUM 20 MG PO TABS
10.0000 mg | ORAL_TABLET | Freq: Every day | ORAL | Status: DC
  Administered 2017-05-02 – 2017-05-07 (×6): 10 mg via ORAL
  Filled 2017-05-01 (×7): qty 1

## 2017-05-01 MED ORDER — MYCOPHENOLATE MOFETIL 250 MG PO CAPS
500.0000 mg | ORAL_CAPSULE | ORAL | Status: DC
  Filled 2017-05-01: qty 2

## 2017-05-01 MED ORDER — ACETAMINOPHEN 325 MG PO TABS
650.0000 mg | ORAL_TABLET | Freq: Four times a day (QID) | ORAL | Status: DC | PRN
  Administered 2017-05-02 – 2017-05-08 (×12): 650 mg via ORAL
  Filled 2017-05-01 (×9): qty 2

## 2017-05-01 MED ORDER — ONDANSETRON HCL 4 MG PO TABS
4.0000 mg | ORAL_TABLET | Freq: Four times a day (QID) | ORAL | Status: DC | PRN
  Administered 2017-05-02 – 2017-05-03 (×2): 4 mg via ORAL
  Filled 2017-05-01 (×2): qty 1

## 2017-05-01 NOTE — ED Notes (Signed)
Pt returned from xray

## 2017-05-01 NOTE — ED Notes (Signed)
Pt resting quietly at this time no complaints voiced.  Waiting for antibiotics to come from pharmacy.

## 2017-05-01 NOTE — H&P (Signed)
[facility] History and Physical  [name] [name] DVV:[id number] DOB: 12-17-1940 DOA: 05/01/2017  Referring physician: *  PCP: Smothers, Andree Elk, NP   Chief Complaint: Shortness of breath  HPI:  76 year old male with history of high counts 1, ESRD, coronary disease, type 2 diabetes, hypertension, dyslipidemia, recently admitted for presumed streptococcal bacteremia for which he was treated initially with vancomycin and Zosyn and subsequently transitioned to oral Augmentin and discharged to rehabilitation on 6/7, who presents to the ER today via EMS from his dialysis center with complaints of shortness of breath, nonproductive cough 3-4 days. Patient denies any other symptoms of fever, chills,rigors, sore throat. Denies nausea vomiting diarrhea or abdominal pain. Patient completed full 3 hours of hemodialysis today prior to coming to the ER ED course BP (!) 166/58 (BP Location: Right Arm)   Pulse 70   Temp 97.5 F (36.4 C) (Oral)   Resp 16   Patient found to be 100% on room air, afebrile, nontoxic-appearing Chest x-ray shows worsening infiltrates in the right upper lobe and left midlung consistent with pneumonia, possible central lucency in the right upper lobe suggesting possible cavitary lesion Patient received broad-spectrum antibiotics and be admitted for HCAP     Review of Systems: negative for the following  Constitutional: Denies fever, chills, diaphoresis, appetite change and fatigue.  HEENT: Denies photophobia, eye pain, redness, hearing loss, ear pain, congestion, sore throat, rhinorrhea, sneezing, mouth sores, trouble swallowing, neck pain, neck stiffness and tinnitus.  Respiratory: Positive for SOB, DOE, cough, chest tightness, and no wheezing.  Cardiovascular: Denies chest pain, palpitations and leg swelling.  Gastrointestinal: Denies nausea, vomiting, abdominal pain, diarrhea, constipation, blood in stool and abdominal distention.  Genitourinary: Denies dysuria,  urgency, frequency, hematuria, flank pain and difficulty urinating.  Musculoskeletal: Denies myalgias, back pain, joint swelling, arthralgias and gait problem.  Skin: Denies pallor, rash and wound.  Neurological: Denies dizziness, seizures, syncope, weakness, light-headedness, numbness and headaches.  Hematological: Denies adenopathy. Easy bruising, personal or family bleeding history  Psychiatric/Behavioral: Denies suicidal ideation, mood changes, confusion, nervousness, sleep disturbance and agitation       Past Medical History:  Diagnosis Date  . Anemia   . Cancer ([place])    SKIN  . CKD III-IV    a. since transplant in 2001 with significant worsening since 06/2013.  Marland Kitchen Coronary artery disease   . Dementia    mild  . Diabetes mellitus without complication (HCC)    FASTING CBG 90S  . Headache(784.0)   . Heart transplanted ([place])    a. 1995 CABG x 4 in [place], [place];  b. 2001 Developed CHF;  c. 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 10/2000 Cardiac transplant @ [facility], [place];  e. Historically nl Bx and stress testing.  Last stress test ~ 5 yrs ago.  Marland Kitchen Hernia of abdominal wall   . History of blood transfusion   . History of stroke   . Hypercalcemia    a. 06/2013 Hospitalized in [place].  Marland Kitchen Hyperlipidemia   . Hypertension    a. Long h/o HTN, came off of all meds following transplant/wt loss-->meds resumed 06/2013, difficult to control since.  . Myocardial infarction ([place])    PRIOR TO HEART TRANSPLANT  . Orthostatic dizziness   . Orthostatic hypotension    a. Has tried support hose and compression stockings - prefers not to wear.  . Shortness of breath    EXERTION  . Stroke ([place]) 06/2013   NO RESIDUAL - WAS CONFUSED AT TIME - THAT HAS RESOLVED  Past Surgical History:  Procedure Laterality Date  . BASCILIC VEIN TRANSPOSITION Left 07/06/2014   Procedure: [place];  Surgeon: Rosetta Posner, MD;  Location: [facility];  Service: Vascular;  Laterality: Left;  . CARDIAC  SURGERY    . CATARACT EXTRACTION    . CORONARY ARTERY BYPASS GRAFT    . CRANIOTOMY Right 05/04/2016   Procedure: Right Craniotomy for Evacuation of Subdural Hematoma;  Surgeon: Earnie Larsson, MD;  Location: [facility] NEURO ORS;  Service: Neurosurgery;  Laterality: Right;  . EYE SURGERY Bilateral    CATARACTS  . HEART TRANSPLANT    . PERIPHERAL VASCULAR CATHETERIZATION N/A 09/06/2016   Procedure: Fistulagram - left arm;  Surgeon: Waynetta Sandy, MD;  Location: [facility] CV LAB;  Service: Cardiovascular;  Laterality: N/A;  . PERIPHERAL VASCULAR CATHETERIZATION Left 09/06/2016   Procedure: Peripheral Vascular Intervention;  Surgeon: Waynetta Sandy, MD;  Location: [facility] CV LAB;  Service: Cardiovascular;  Laterality: Left;  UPPER ARM COVERED STENT  . SKIN GRAFT FULL THICKNESS LEG     cat scratch that did not heal  . VASECTOMY        Social History:  reports that he has never smoked. He has never used smokeless tobacco. He reports that he does not drink alcohol or use drugs.    Allergies  Allergen Reactions  . Norvasc [Amlodipine Besylate] Swelling and Other (See Comments)    Swelling of the lower body     Family History  Problem Relation Age of Onset  . Heart attack Father        died @ 30  . Diabetes Father   . Heart attack Mother   . Diabetes Mother   . Kidney disease Maternal Aunt        ,   Prior to Admission medications   Medication Sig Start Date End Date Taking? Authorizing Provider  acetaminophen (TYLENOL) 325 MG tablet Take 2 tablets (650 mg total) by mouth every 6 (six) hours as needed for mild pain (or Fever >/= 101). 10/14/14   Delfina Redwood, MD  amoxicillin-clavulanate (AUGMENTIN) 500-125 MG tablet Take 1 tablet (500 mg total) by mouth at bedtime. 04/26/17 05/01/17  Patrecia Pour, MD  aspirin EC 81 MG tablet Take 81 mg by mouth at bedtime.    [provider]  calcium acetate (PHOSLO) 667 MG capsule Take 1 capsule (667 mg total) by  mouth 3 (three) times daily with meals. 08/24/16   Ghimire, Henreitta Leber, MD  cholecalciferol (VITAMIN D) 1000 UNITS tablet Take 1,000 Units by mouth daily.    [provider]  cycloSPORINE modified (NEORAL) 25 MG capsule Take 50-75 mg by mouth See admin instructions. 75 mg in the morning and 50 mg in the evening 08/18/14   Lelon Perla, MD  diphenoxylate-atropine (LOMOTIL) 2.5-0.025 MG tablet Take 1 tablet by mouth 4 (four) times daily as needed for diarrhea or loose stools. 08/24/16   Ghimire, Henreitta Leber, MD  donepezil (ARICEPT) 10 MG tablet Take 10 mg by mouth at bedtime.  03/01/15   [provider]  FLUoxetine (PROZAC) 20 MG capsule Take 20 mg by mouth daily.     [provider]  fluticasone (FLONASE) 50 MCG/ACT nasal spray Place 2 sprays into both nostrils at bedtime as needed for allergies.  04/12/16   [provider]  hydrALAZINE (APRESOLINE) 25 MG tablet Take 1 tablet (25 mg total) by mouth 3 (three) times daily. 04/25/17   Patrecia Pour, MD  lansoprazole (PREVACID) 30 MG capsule Take 30 mg by mouth 2 (two) times daily before a meal.  12/22/15   [provider]  levothyroxine (SYNTHROID, LEVOTHROID) 75 MCG tablet Take 75 mcg by mouth daily.     [provider]  metoprolol tartrate (LOPRESSOR) 25 MG tablet Take 2 tablets (50 mg total) by mouth 2 (two) times daily. 12/27/14   Rai, Vernelle Emerald, MD  multivitamin (RENA-VIT) TABS tablet Take 1 tablet by mouth daily.  03/08/15   [provider]  mycophenolate (CELLCEPT) 250 MG capsule Take 250-500 mg by mouth daily. Take 500 mg every morning and take 250 mg every evening on Sunday, Monday, Wednesday and Friday then take 500 mg every evening on Tuesday Thursday and Saturday    [provider]  Nutritional Supplements (FEEDING SUPPLEMENT, NEPRO CARB STEADY,) LIQD Take 237 mLs by mouth 2 (two) times daily between meals. 04/25/17   Patrecia Pour, MD  pravastatin (PRAVACHOL) 10 MG tablet Take 10 mg  by mouth at bedtime.     [provider]  Probiotic Product (PRO-BIOTIC BLEND) CAPS Take 1 capsule by mouth daily.    [provider]  vitamin B-12 (CYANOCOBALAMIN) 1000 MCG tablet Take 1,000 mcg by mouth daily with breakfast.    [provider]     Physical Exam: Vitals:   05/01/17 1600 05/01/17 1615 05/01/17 1630 05/01/17 1645  BP: (!) 167/66 (!) 160/63 (!) 165/65 (!) 159/65  Pulse: 69 68 66 66  Resp: 20 18 16 17   Temp:      TempSrc:      SpO2: 100% 100% 100% 100%  Weight:      Height:            Vitals:   05/01/17 1600 05/01/17 1615 05/01/17 1630 05/01/17 1645  BP: (!) 167/66 (!) 160/63 (!) 165/65 (!) 159/65  Pulse: 69 68 66 66  Resp: 20 18 16 17   Temp:      TempSrc:      SpO2: 100% 100% 100% 100%  Weight:      Height:       Constitutional: NAD, calm, Chronically ill-appearing Eyes: PERRL, lids and conjunctivae normal ENMT: Mucous membranes are dry. Posterior pharynx clear of any exudate or lesions.Normal dentition.  Neck: normal, supple, no masses, no thyromegaly Respiratory: Decreased breath sounds bilaterally, no wheezing, no crackles. Normal respiratory effort. No accessory muscle use.  Cardiovascular: Regular rate and rhythm, no murmurs / rubs / gallops. No extremity edema. 2+ pedal pulses. No carotid bruits.  Abdomen: no tenderness, no masses palpated. No hepatosplenomegaly. Bowel sounds positive.  Musculoskeletal: no clubbing / cyanosis. No joint deformity upper and lower extremities. Good ROM, no contractures. Normal muscle tone.  Skin: no rashes, lesions, ulcers. No induration Neurologic: CN 2-12 grossly intact. Sensation intact, DTR normal. Strength 5/5 in all 4.  Psychiatric: Normal judgment and insight. Alert and oriented x 3. Normal mood.     Labs on Admission: I have personally reviewed following labs and imaging studies  CBC:  Recent Labs Lab 04/25/17 04/25/17 0555 04/26/17 04/26/17 0427 05/01/17 1642  WBC 10.9 10.9*  10.0 10.0 9.7  NEUTROABS  --  8.1*  --  6.5  --   HGB 10.8* 10.8*  --  9.7* 10.0*  HCT 35* 34.8*  --  31.3* 31.6*  MCV  --  97.2  --  98.4 95.2  PLT 204 204  --  209 185    Basic Metabolic Panel:  Recent Labs Lab 04/26/17 0728  05/01/17 1642  NA 132* 135  K 3.0* 3.7  CL 91* 94*  CO2 25 29  GLUCOSE 142* 165*  BUN 40* 34*  CREATININE 5.00* 4.99*  CALCIUM 8.0* 8.2*  MG  --  2.0  PHOS 3.8 3.1    GFR: Estimated Creatinine Clearance: 10.9 mL/min (A) (by C-G formula based on SCr of 4.99 mg/dL (H)).  Liver Function Tests:  Recent Labs Lab 04/26/17 0728 05/01/17 1642  ALBUMIN 2.4* 2.3*   No results for input(s): LIPASE, AMYLASE in the last 168 hours. No results for input(s): AMMONIA in the last 168 hours.  Coagulation Profile: No results for input(s): INR, PROTIME in the last 168 hours. No results for input(s): DDIMER in the last 72 hours.  Cardiac Enzymes: No results for input(s): CKTOTAL, CKMB, CKMBINDEX, TROPONINI in the last 168 hours.  BNP (last 3 results) No results for input(s): PROBNP in the last 8760 hours.  HbA1C: No results for input(s): HGBA1C in the last 72 hours. Lab Results  Component Value Date   HGBA1C 5.5 05/02/2016   HGBA1C 6.3 (H) 06/21/2014     CBG:  Recent Labs Lab 04/25/17 1701 04/25/17 2211 04/26/17 1230 04/26/17 1318 04/26/17 1554  GLUCAP 204* 148* 242* 233* 252*    Lipid Profile: No results for input(s): CHOL, HDL, LDLCALC, TRIG, CHOLHDL, LDLDIRECT in the last 72 hours.  Thyroid Function Tests: No results for input(s): TSH, T4TOTAL, FREET4, T3FREE, THYROIDAB in the last 72 hours.  Anemia Panel: No results for input(s): VITAMINB12, FOLATE, FERRITIN, TIBC, IRON, RETICCTPCT in the last 72 hours.  Urine analysis:    Component Value Date/Time   COLORURINE YELLOW 02/03/2017 1340   APPEARANCEUR CLOUDY (A) 02/03/2017 1340   LABSPEC 1.013 02/03/2017 1340   PHURINE 8.0 02/03/2017 1340   GLUCOSEU NEGATIVE 02/03/2017 1340    HGBUR MODERATE (A) 02/03/2017 1340   BILIRUBINUR NEGATIVE 02/03/2017 1340   KETONESUR NEGATIVE 02/03/2017 1340   PROTEINUR 100 (A) 02/03/2017 1340   UROBILINOGEN 1.0 04/22/2015 1952   NITRITE NEGATIVE 02/03/2017 1340   LEUKOCYTESUR LARGE (A) 02/03/2017 1340    Sepsis Labs: @LABRCNTIP (procalcitonin:4,lacticidven:4) ) Recent Results (from the past 240 hour(s))  Blood culture (routine x 2)     Status: None   Collection Time: 04/22/17  1:45 AM  Result Value Ref Range Status   Specimen Description BLOOD RIGHT ANTECUBITAL  Final   Special Requests   Final    BOTTLES DRAWN AEROBIC AND ANAEROBIC Blood Culture adequate volume   Culture NO GROWTH 5 DAYS  Final   Report Status 04/27/2017 FINAL  Final  Blood culture (routine x 2)     Status: None   Collection Time: 04/22/17  2:15 AM  Result Value Ref Range Status   Specimen Description BLOOD RIGHT ARM  Final   Special Requests   Final    BOTTLES DRAWN AEROBIC AND ANAEROBIC Blood Culture adequate volume   Culture NO GROWTH 5 DAYS  Final   Report Status 04/27/2017 FINAL  Final  MRSA PCR Screening     Status: Abnormal   Collection Time: 04/22/17  6:37 AM  Result Value Ref Range Status   MRSA by PCR POSITIVE (A) NEGATIVE Final    Comment:        The GeneXpert MRSA Assay (FDA approved for NASAL specimens only), is one component of a comprehensive MRSA colonization surveillance program. It is not intended to diagnose MRSA infection nor to guide or monitor treatment for MRSA infections. RESULT CALLED TO, READ BACK BY AND VERIFIED  WITH: C.HOLDSON RN AT 0800 04/22/17 BY A.DAVIS          Radiological Exams on Admission: Dg Chest 2 View  Result Date: 05/01/2017 CLINICAL DATA:  Shortness of breath and nonproductive cough over the last 3-4 days. EXAM: CHEST  2 VIEW COMPARISON:  04/22/2017 FINDINGS: Previous median sternotomy. Heart size is normal. There is chronic scarring in the right lower lung. There are progressive patchy densities in  the right upper lobe and in the left mid lung. In the right upper lobe, there could be some central low density suggesting possible cavitation. No evidence of edema. Pleural scarring on the right. IMPRESSION: Worsened infiltrates in the right upper lung and left mid lung consistent with pneumonia. Question developing central lucency in the right upper lobe region suggesting possible cavitation. Electronically Signed   By: Nelson Chimes M.D.   On: 05/01/2017 17:00   Dg Chest 2 View  Result Date: 05/01/2017 CLINICAL DATA:  Shortness of breath and nonproductive cough over the last 3-4 days. EXAM: CHEST  2 VIEW COMPARISON:  04/22/2017 FINDINGS: Previous median sternotomy. Heart size is normal. There is chronic scarring in the right lower lung. There are progressive patchy densities in the right upper lobe and in the left mid lung. In the right upper lobe, there could be some central low density suggesting possible cavitation. No evidence of edema. Pleural scarring on the right. IMPRESSION: Worsened infiltrates in the right upper lung and left mid lung consistent with pneumonia. Question developing central lucency in the right upper lobe region suggesting possible cavitation. Electronically Signed   By: Nelson Chimes M.D.   On: 05/01/2017 17:00   Dg Chest 2 View  Result Date: 04/22/2017 CLINICAL DATA:  Rash, positive blood cultures EXAM: CHEST  2 VIEW COMPARISON:  02/04/2017, 11/09/2016 FINDINGS: Post sternotomy changes. Ill-defined opacities in the peripheral right upper lobe, right base and left mid lung. No pleural effusion. Mild cardiomegaly with aortic atherosclerosis. No pneumothorax. Vascular stent in the left axilla. IMPRESSION: 1. Ill-defined multifocal opacity in the right upper lobe, right base and left upper lobe suspicious for infiltrates. 2. Cardiomegaly without edema. Electronically Signed   By: Donavan Foil M.D.   On: 04/22/2017 02:07      EKG: Independently reviewed. *:  Sinus rhythm  Incomplete right bundle branch block    Assessment/Plan  Healthcare associated pneumonia Recently admitted for presumed streptococcal bacteremia Patient appears nontoxic and does not appear to be septic at all Given appearance of chest x-ray patient's antibiotics were restarted CT chest further evaluate patient's cavitary lesion Continue antibiotics, check pro-calcitonin, patient is immunocompromise Follow blood culture results, if workup essentially negative, consider stopping antibiotics  ESRD: TTS schedule. Patient received full session of dialysis today EDP notified nephrology Continue PhosLo  History of heart transplant 2001:[facility], [place], [place].   continue cyclosporine and CellCept  Controlled T2DM:Diet-controlled, HbA1c 5.5% in 2017. Initially hyperglycemic likely due to infection. Will start patient on modified diet and sliding scale insulin  Hypertension  and Norvasc - Continue metoprolol 50mg  BID, Norvasc    Dyslipidemia-continue pravastatin    Hypothyroidism: Repeat TSH - Continue levothyroxine 75 g daily  Barrett's esophagus:   Continue PPI  Anemia of chronic disease: - Monitor  Depression and dementia: - Continue fluoxetine and aricept    DVT prophylaxis: /Heparin      Code Status Orders full code       consults called: renal by EDP  Family Communication: Admission, patients condition and plan of care including  tests being ordered have been discussed with the patient  who indicates understanding and agree with the plan and Code Status  Admission status: inpatient    Disposition plan: Further plan will depend as patient's clinical course evolves and further radiologic and laboratory data become available. Likely SNF when ready for discharge  At the time of admission, it appears that the appropriate admission status for this patient is INPATIENT .Thisis judged to be reasonable and necessary in order to provide the required  intensity of service to ensure the patient's safetygiven thepresenting symptoms, physical exam findings, and initial radiographic and laboratory data in the context of their chronic comorbidities.   Reyne Dumas MD [facility] Pager [phone]  If 7PM-7AM, please contact night-coverage [email or web address] Password [facility]  05/01/2017, 5:41 PM

## 2017-05-01 NOTE — ED Notes (Signed)
Pt returned from CT °

## 2017-05-01 NOTE — ED Provider Notes (Signed)
I saw and evaluated the patient, reviewed the resident's note and I agree with the findings and plan.   EKG Interpretation  Date/Time:  Tuesday May 01 2017 15:40:06 EDT Ventricular Rate:  70 PR Interval:    QRS Duration: 116 QT Interval:  457 QTC Calculation: 494 R Axis:   57 Text Interpretation:  Sinus rhythm Incomplete right bundle branch block No significant change since last tracing Confirmed by Jemima Petko  MD, Kairon Shock ([id number]) on 05/01/2017 4:03:6 PM     76 year old male presents from dialysis complaining of body pains all over. Has had similar symptoms when he is dialyzed. Last dialysis was his usual schedule session. Today he went for about 2 hours. After being removed from the machine he says he feels better. Denies any fever or chills. Will check electrolytes and then reevaluate   Lacretia Leigh, MD 05/01/17 [phone]

## 2017-05-01 NOTE — ED Triage Notes (Signed)
Pt to ED via [facility] from dialysis center with c/o shortness of breath.  Pt also c/o nonproductive cough x's 3-4 days,  Pt c/o pain all over.  Pt's 02 sat on R/A 100%.  EMS was called at pt's request

## 2017-05-01 NOTE — Progress Notes (Addendum)
Pharmacy Antibiotic Note  Dadrian Ballantine is a 76 y.o. male admitted on 6/12/2018with suspected pneumonia.  Pharmacy has been consulted for vancomycin and cefepime dosing.   Patient is ESRD on dialysis and has history of heart transplant on immunosuppression. Of note, patient would have completed his prescription of Augmentin today, which he was discharged with on 6/7 after admission here. His last admission he was seen by ID for "presumed strep bacteremia" which was discovered at an UC visit in [place].   Patient is currently afebrile and WBC normal. Patient reports that he did not receive antibiotics at HD today and was taking PO antibiotics as prescribed at home.   Plan: Vancomycin 1250 mg IV x1, then future doses per HD schedule Cefepime 1g IV x1 per MD, then 1g IV q24hr Vancomycin trough goal 15-20 mcg/mL Vancomycin trough at [facility] and as needed F/u HD schedule, culture data, and clinical picture   Height: 5\' 10"  (177.8 cm) Weight: 135 lb (61.2 kg) IBW/kg (Calculated) : 73  Temp (24hrs), Avg:97.5 F (36.4 C), Min:97.5 F (36.4 C), Max:97.5 F (36.4 C)   Recent Labs Lab 04/25/17 04/25/17 0555 04/26/17 04/26/17 0427 04/26/17 0728 05/01/17 1642  WBC 10.9 10.9* 10.0 10.0  --  9.7  CREATININE  --   --   --   --  5.00* 4.99*    Estimated Creatinine Clearance: 10.9 mL/min (A) (by C-G formula based on SCr of 4.99 mg/dL (H)).    Allergies  Allergen Reactions  . Norvasc [Amlodipine Besylate] Swelling and Other (See Comments)    Swelling of the lower body     Antimicrobials this admission: 6/12 Vanc >>  6/12 Cefepime >>  Dose adjustments this admission: n/a  Microbiology results: pending   Argie Ramming, PharmD Pharmacy Resident  Pager [phone] 05/01/17 5:30 PM

## 2017-05-01 NOTE — ED Provider Notes (Signed)
[name] [name] DEPT Provider Note   CSN: [id number] Arrival date & time: 05/01/17  1528     History   Chief Complaint Chief Complaint  Patient presents with  . Shortness of Breath    HPI [name] [name] is a 76 y.o. male.  The history is provided by the patient and medical records. No language interpreter was used.  Shortness of Breath  This is a new problem. The average episode lasts 1 day. The problem occurs continuously.The current episode started 12 to 24 hours ago. The problem has been gradually improving. Associated symptoms include cough. Pertinent negatives include no fever, no sore throat, no ear pain, no chest pain, no vomiting, no abdominal pain and no rash.    Past Medical History:  Diagnosis Date  . Anemia   . Cancer ([place])    SKIN  . CKD III-IV    a. since transplant in 2001 with significant worsening since 06/2013.  Marland Kitchen Coronary artery disease   . Dementia    mild  . Diabetes mellitus without complication (HCC)    FASTING CBG 90S  . Headache(784.0)   . Heart transplanted ([place])    a. 1995 CABG x 4 in [place], [place];  b. 2001 Developed CHF;  c. 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 10/2000 Cardiac transplant @ [facility], [place];  e. Historically nl Bx and stress testing.  Last stress test ~ 5 yrs ago.  Marland Kitchen Hernia of abdominal wall   . History of blood transfusion   . History of stroke   . Hypercalcemia    a. 06/2013 Hospitalized in [place].  Marland Kitchen Hyperlipidemia   . Hypertension    a. Long h/o HTN, came off of all meds following transplant/wt loss-->meds resumed 06/2013, difficult to control since.  . Myocardial infarction ([place])    PRIOR TO HEART TRANSPLANT  . Orthostatic dizziness   . Orthostatic hypotension    a. Has tried support hose and compression stockings - prefers not to wear.  . Shortness of breath    EXERTION  . Stroke ([place]) 06/2013   NO RESIDUAL - WAS CONFUSED AT TIME - THAT HAS RESOLVED    Patient Active Problem List   Diagnosis Date Noted  . Pressure  injury of skin 05/02/2017  . HCAP (healthcare-associated pneumonia) 05/01/2017  . Malnutrition of moderate degree 04/25/2017  . Streptococcal bacteremia 04/22/2017  . Acute metabolic encephalopathy 72/53/6644  . Volume overload 02/03/2017  . Generalized weakness 08/22/2016  . Urinary tract infection   . Syncope and collapse   . Syncope 08/07/2016  . Faintness   . Uncontrolled hypertension   . Acute blood loss anemia   . Cardiomyopathy ([place])   . Controlled diabetes mellitus type 2 with complications ([place])   . 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 Traumatic subdural hematoma ([place]) 05/04/2016  . 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 Subdural hematoma ([place]) 05/02/2016  . CKD (chronic kidney disease)   . Protein-calorie malnutrition, severe ([place]) 04/23/2015  . Anemia of chronic disease 04/23/2015  . Depression 04/23/2015  . UTI (urinary tract infection) 04/22/2015  . Barrett's esophagus 03/22/2015  . 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 DM (diabetes mellitus), type 2 with renal complications ([place]) 03/47/4259  . Hypothyroidism 10/06/2014  . Heart transplant status ([place])   . Hypercholesterolemia 06/20/2014  . 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 BASCILIC VEIN TRANSPOSITION Left 07/06/2014   Procedure: BASCILIC  VEIN TRANSPOSITION;  Surgeon: Rosetta Posner, MD;  Location: [facility];  Service: Vascular;  Laterality: Left;  . CARDIAC SURGERY    . CATARACT EXTRACTION    . CORONARY ARTERY BYPASS GRAFT    . CRANIOTOMY Right 05/04/2016   Procedure: Right Craniotomy for Evacuation of Subdural Hematoma;  Surgeon: Earnie Larsson, MD;  Location: [facility] NEURO ORS;  Service: Neurosurgery;  Laterality: Right;  . EYE SURGERY Bilateral    CATARACTS  . HEART TRANSPLANT    . PERIPHERAL VASCULAR CATHETERIZATION N/A 09/06/2016   Procedure: Fistulagram - left arm;  Surgeon: Waynetta Sandy, MD;  Location: [facility] CV LAB;  Service: Cardiovascular;  Laterality: N/A;  . PERIPHERAL VASCULAR CATHETERIZATION Left 09/06/2016   Procedure: Peripheral Vascular Intervention;  Surgeon: Waynetta Sandy, MD;  Location: [facility] CV LAB;  Service: Cardiovascular;  Laterality: Left;  UPPER ARM COVERED STENT  . SKIN GRAFT FULL THICKNESS LEG     cat scratch that did not heal  . VASECTOMY         Home Medications    Prior to Admission medications   Medication Sig Start Date End Date Taking? Authorizing Provider  acetaminophen (TYLENOL) 325 MG tablet Take 2 tablets (650 mg total) by mouth every 6 (six) hours as needed for mild pain (or Fever >/= 101). 10/14/14   Delfina Redwood, MD  aspirin EC 81 MG tablet Take 81 mg by mouth at bedtime.    [provider]  calcium acetate (PHOSLO) 667 MG capsule Take 1 capsule (667 mg total) by mouth 3 (three) times daily with meals. 08/24/16   Ghimire, Henreitta Leber, MD  cholecalciferol (VITAMIN D) 1000 UNITS tablet Take 1,000 Units by mouth daily.    [provider]  cycloSPORINE modified (NEORAL) 25 MG capsule Take 50-75 mg by mouth See admin instructions. 75 mg in the morning and 50 mg in the evening 08/18/14   Lelon Perla, MD  diphenoxylate-atropine (LOMOTIL) 2.5-0.025 MG tablet Take 1 tablet by mouth 4 (four) times daily as needed for diarrhea or loose stools. 08/24/16   Ghimire, Henreitta Leber, MD  donepezil (ARICEPT) 10 MG tablet Take 10 mg by mouth at bedtime.  03/01/15   [provider]  FLUoxetine (PROZAC) 20 MG capsule Take 20 mg by mouth daily.     [provider]  fluticasone (FLONASE) 50 MCG/ACT nasal spray Place 2 sprays into both nostrils at bedtime as needed for allergies.  04/12/16   [provider]  hydrALAZINE (APRESOLINE) 25 MG tablet Take 1 tablet (25 mg total) by mouth 3 (three) times daily. 04/25/17   Patrecia Pour, MD  lansoprazole (PREVACID) 30 MG capsule Take 30 mg by mouth 2  (two) times daily before a meal.  12/22/15   [provider]  levothyroxine (SYNTHROID, LEVOTHROID) 75 MCG tablet Take 75 mcg by mouth daily.     [provider]  metoprolol tartrate (LOPRESSOR) 25 MG tablet Take 2 tablets (50 mg total) by mouth 2 (two) times daily. 12/27/14   Rai, Vernelle Emerald, MD  multivitamin (RENA-VIT) TABS tablet Take 1 tablet by mouth daily.  03/08/15   [provider]  mycophenolate (CELLCEPT) 250 MG capsule Take 250-500 mg by mouth daily. Take 500 mg every morning and take 250 mg every evening on Sunday, Monday, Wednesday and Friday then take 500 mg every evening on Tuesday Thursday and Saturday    [provider]  Nutritional Supplements (FEEDING SUPPLEMENT, NEPRO CARB STEADY,) LIQD Take 237  mLs by mouth 2 (two) times daily between meals. 04/25/17   Patrecia Pour, MD  pravastatin (PRAVACHOL) 10 MG tablet Take 10 mg by mouth at bedtime.     [provider]  Probiotic Product (PRO-BIOTIC BLEND) CAPS Take 1 capsule by mouth daily.    [provider]  vitamin B-12 (CYANOCOBALAMIN) 1000 MCG tablet Take 1,000 mcg by mouth daily with breakfast.    [provider]    Family History Family History  Problem Relation Age of Onset  . Heart attack Father        died @ 68  . Diabetes Father   . Heart attack Mother   . Diabetes Mother   . Kidney disease Maternal Aunt     Social History Social History  Substance Use Topics  . Smoking status: Never Smoker  . Smokeless tobacco: Never Used  . Alcohol use No     Allergies   Norvasc [amlodipine besylate]   Review of Systems Review of Systems  Constitutional: Negative for chills and fever.  HENT: Negative for ear pain and sore throat.   Eyes: Negative for pain and visual disturbance.  Respiratory: Positive for cough and shortness of breath.   Cardiovascular: Negative for chest pain and palpitations.  Gastrointestinal: Negative for abdominal pain and vomiting.    Genitourinary: Negative for dysuria and hematuria.  Musculoskeletal: Positive for myalgias. Negative for arthralgias and back pain.  Skin: Negative for color change and rash.  Neurological: Negative for seizures and syncope.  All other systems reviewed and are negative.    Physical Exam Updated Vital Signs BP (!) 193/71 (BP Location: Right Arm)   Pulse 73   Temp 98.4 F (36.9 C) (Oral)   Resp 19   Ht 5\' 10"  (1.778 m)   Wt 61.2 kg (135 lb)   SpO2 100%   BMI 19.37 kg/m   Physical Exam  Constitutional: He appears well-developed. He appears ill.  HENT:  Head: Normocephalic and atraumatic.  Eyes: Conjunctivae are normal.  Neck: Neck supple.  Cardiovascular: Normal rate and regular rhythm.   No murmur heard. Pulmonary/Chest: Effort normal. No respiratory distress.  Abdominal: Soft. There is no tenderness.  Musculoskeletal: He exhibits no edema.  R forearm fistula with palpable thrill  Neurological: He is alert. No cranial nerve deficit. Coordination normal.  5/5 motor strength and intact sensation in all extremities. Intact bilateral finger-to-nose coordination  Skin: Skin is warm and dry.  Nursing note and vitals reviewed.    ED Treatments / Results  Labs (all labs ordered are listed, but only abnormal results are displayed) Labs Reviewed  CBC - Abnormal; Notable for the following:       Result Value   RBC 3.32 (*)    Hemoglobin 10.0 (*)    HCT 31.6 (*)    All other components within normal limits  RENAL FUNCTION PANEL - Abnormal; Notable for the following:    Chloride 94 (*)    Glucose, Bld 165 (*)    BUN 34 (*)    Creatinine, Ser 4.99 (*)    Calcium 8.2 (*)    Albumin 2.3 (*)    GFR calc non Af Amer 10 (*)    GFR calc Af Amer 12 (*)    All other components within normal limits  CBC - Abnormal; Notable for the following:    WBC 10.9 (*)    RBC 2.84 (*)    Hemoglobin 8.6 (*)    HCT 27.2 (*)    All  other components within normal limits  CREATININE, SERUM  - Abnormal; Notable for the following:    Creatinine, Ser 5.43 (*)    GFR calc non Af Amer 9 (*)    GFR calc Af Amer 11 (*)    All other components within normal limits  COMPREHENSIVE METABOLIC PANEL - Abnormal; Notable for the following:    Sodium 134 (*)    Chloride 95 (*)    Glucose, Bld 104 (*)    BUN 41 (*)    Creatinine, Ser 6.32 (*)    Calcium 7.7 (*)    Total Protein 5.4 (*)    Albumin 2.2 (*)    GFR calc non Af Amer 8 (*)    GFR calc Af Amer 9 (*)    All other components within normal limits  CBC - Abnormal; Notable for the following:    RBC 3.13 (*)    Hemoglobin 9.4 (*)    HCT 30.0 (*)    All other components within normal limits  CULTURE, BLOOD (ROUTINE X 2)  CULTURE, BLOOD (ROUTINE X 2)  MAGNESIUM  PROCALCITONIN  TSH  GLUCOSE, CAPILLARY  I-STAT CG4 LACTIC ACID, ED  I-STAT CG4 LACTIC ACID, ED    EKG  EKG Interpretation  Date/Time:  Tuesday May 01 2017 15:40:06 EDT Ventricular Rate:  70 PR Interval:    QRS Duration: 116 QT Interval:  457 QTC Calculation: 494 R Axis:   57 Text Interpretation:  Sinus rhythm Incomplete right bundle branch block No significant change since last tracing Confirmed by ALLEN  MD, ANTHONY ([id number]) on 05/01/2017 4:03:36 PM       Radiology Dg Chest 2 View  Result Date: 05/01/2017 CLINICAL DATA:  Shortness of breath and nonproductive cough over the last 3-4 days. EXAM: CHEST  2 VIEW COMPARISON:  04/22/2017 FINDINGS: Previous median sternotomy. Heart size is normal. There is chronic scarring in the right lower lung. There are progressive patchy densities in the right upper lobe and in the left mid lung. In the right upper lobe, there could be some central low density suggesting possible cavitation. No evidence of edema. Pleural scarring on the right. IMPRESSION: Worsened infiltrates in the right upper lung and left mid lung consistent with pneumonia. Question developing central lucency in the right upper lobe region suggesting possible  cavitation. Electronically Signed   By: Nelson Chimes M.D.   On: 05/01/2017 17:00   Ct Chest Wo Contrast  Result Date: 05/01/2017 CLINICAL DATA:  Lung lesions EXAM: CT CHEST WITHOUT CONTRAST TECHNIQUE: Multidetector CT imaging of the chest was performed following the standard protocol without IV contrast. COMPARISON:  Radiographs 05/01/2017, CT thorax 10/05/2014 FINDINGS: Cardiovascular: Limited evaluation without intravenous contrast. Aortic atherosclerosis. Post CABG changes. No significant pericardial effusion. Vascular stent in the left axilla. Mediastinum/Nodes: Mild mediastinal adenopathy, for example right paratracheal lymph node measures 12 mm short axis. Small calcified left hilar lymph nodes. Midline trachea. No thyroid mass. Mild air distention of the esophagus. Lungs/Pleura: Small right pleural effusion or pleural thickening. Mildly loculated pleural effusion along the right pulmonary fissures. Ground-glass density and consolidation in the right upper lobe with cavitation, this measures approximately 4.4 by 3 cm. Multiple mostly peripheral bilateral lung nodules, for example left upper lobe lateral subpleural nodule measures 7 mm, series 6, image number 50. Anterior subpleural left upper lobe nodule measuring 14 mm, series 6, image number 75. Left subpleural, posterior 16 mm nodule with mild cavitation. Multiple additional pulmonary nodules. Upper Abdomen: Atrophic kidneys.  Splenic granuloma. Musculoskeletal:  Post sternotomy changes. No acute or suspicious bone lesion. IMPRESSION: 1. Approximate 4.4 cm cavitary lesion in the right upper lobe with surrounding ground-glass density, differential considerations include cavitary infection and cavitary neoplasm. There are multiple additional bilateral pulmonary nodules, some with cavitation. Differential considerations include cavitary infection, septic emboli, and metastatic disease. 2. Small right-sided pleural effusion with loculated fluid along the  right pulmonary fissures. 3. Mediastinal adenopathy could be reactive 4. Atrophic kidneys Aortic Atherosclerosis (ICD10-I70.0). Electronically Signed   By: Donavan Foil M.D.   On: 05/01/2017 19:39    Procedures Procedures (including critical care time)  Medications Ordered in ED Medications  hydrALAZINE (APRESOLINE) tablet 25 mg (0 mg Oral Hold 05/02/17 1000)  calcium acetate (PHOSLO) capsule 667 mg (667 mg Oral Given 05/02/17 0828)  aspirin EC tablet 81 mg (81 mg Oral Not Given 05/02/17 0007)  pantoprazole (PROTONIX) EC tablet 20 mg (20 mg Oral Not Given 05/02/17 0010)  donepezil (ARICEPT) tablet 10 mg (10 mg Oral Not Given 05/02/17 0009)  metoprolol tartrate (LOPRESSOR) tablet 25 mg (0 mg Oral Hold 05/02/17 1000)  FLUoxetine (PROZAC) capsule 20 mg (not administered)  levothyroxine (SYNTHROID, LEVOTHROID) tablet 75 mcg (75 mcg Oral Given 05/02/17 0828)  pravastatin (PRAVACHOL) tablet 10 mg (10 mg Oral Not Given 05/02/17 0010)  vitamin B-12 (CYANOCOBALAMIN) tablet 1,000 mcg (1,000 mcg Oral Given 05/02/17 0828)  heparin injection 5,000 Units (5,000 Units Subcutaneous Given 05/02/17 0626)  acetaminophen (TYLENOL) tablet 650 mg (650 mg Oral Given 05/02/17 0007)    Or  acetaminophen (TYLENOL) suppository 650 mg ( Rectal See Alternative 05/02/17 0007)  ondansetron (ZOFRAN) tablet 4 mg (4 mg Oral Given 05/02/17 0009)    Or  ondansetron (ZOFRAN) injection 4 mg ( Intravenous See Alternative 05/02/17 0009)  levalbuterol (XOPENEX) nebulizer solution 0.63 mg (not administered)  ceFEPIme (MAXIPIME) 1 g in dextrose 5 % 50 mL IVPB (0 g Intravenous Stopped 05/01/17 1929)  cycloSPORINE modified (NEORAL) capsule 75 mg (75 mg Oral Given 05/02/17 0828)    And  cycloSPORINE modified (NEORAL) capsule 50 mg (not administered)  mycophenolate (CELLCEPT) capsule 500 mg (not administered)  mycophenolate (CELLCEPT) capsule 250 mg (250 mg Oral Not Given 05/02/17 0011)    Or  mycophenolate (CELLCEPT) capsule 500 mg ( Oral See  Alternative 05/02/17 0011)  mupirocin ointment (BACTROBAN) 2 % (not administered)  Chlorhexidine Gluconate Cloth 2 % PADS 6 each (6 each Topical Not Given 05/02/17 0715)  ceFEPIme (MAXIPIME) 1 g in dextrose 5 % 50 mL IVPB (0 g Intravenous Stopped 05/01/17 1925)  vancomycin (VANCOCIN) 1,250 mg in sodium chloride 0.9 % 250 mL IVPB (0 mg Intravenous Stopped 05/01/17 2101)     Initial Impression / Assessment and Plan / ED Course  I have reviewed the triage vital signs and the nursing notes.  Pertinent labs & imaging results that were available during my care of the patient were reviewed by me and considered in my medical decision making (see chart for details).     76 year old male history of ESRD on HD TTS, cardiac transplant in 2001, diabetes, HTN, HLD, stroke without residual deficits who presents via EMS from dialysis session for diffuse body aches.  Patient reports compliance with dialysis over the past week.  Today, he was 2 hours into his dialysis session when he developed diffuse body aches and shortness of breath.  EMS was contacted and patient brought to ED for further evaluation. He currently denies pain or acute symptoms including fevers. Pt recently admitted on 6/2 for bactermia.  AF, VSS. No focal neuro deficits. Lungs mildly diminished. Abdomen soft, benign throughout.  CXR showing worsening infiltrates over RUL and LML consistent with pneumonia. Vanc/cefepime given for HAP.   Pt admitted to hospitalist service for further management and evaluation. Pt stable at time of transfer.  Final Clinical Impressions(s) / ED Diagnoses   Final diagnoses:  Cavitary lesion of lung    New Prescriptions Current Discharge Medication List       Payton Emerald, MD 05/02/17 [phone]

## 2017-05-02 DIAGNOSIS — N186 End stage renal disease: Secondary | ICD-10-CM

## 2017-05-02 DIAGNOSIS — Z7982 Long term (current) use of aspirin: Secondary | ICD-10-CM

## 2017-05-02 DIAGNOSIS — Z794 Long term (current) use of insulin: Secondary | ICD-10-CM

## 2017-05-02 DIAGNOSIS — E1122 Type 2 diabetes mellitus with diabetic chronic kidney disease: Secondary | ICD-10-CM

## 2017-05-02 DIAGNOSIS — Z833 Family history of diabetes mellitus: Secondary | ICD-10-CM

## 2017-05-02 DIAGNOSIS — L8990 Pressure ulcer of unspecified site, unspecified stage: Secondary | ICD-10-CM | POA: Insufficient documentation

## 2017-05-02 DIAGNOSIS — Z992 Dependence on renal dialysis: Secondary | ICD-10-CM

## 2017-05-02 DIAGNOSIS — Z8249 Family history of ischemic heart disease and other diseases of the circulatory system: Secondary | ICD-10-CM

## 2017-05-02 DIAGNOSIS — Z79899 Other long term (current) drug therapy: Secondary | ICD-10-CM

## 2017-05-02 DIAGNOSIS — Z841 Family history of disorders of kidney and ureter: Secondary | ICD-10-CM

## 2017-05-02 DIAGNOSIS — Z888 Allergy status to other drugs, medicaments and biological substances status: Secondary | ICD-10-CM

## 2017-05-02 DIAGNOSIS — J984 Other disorders of lung: Secondary | ICD-10-CM

## 2017-05-02 DIAGNOSIS — J851 Abscess of lung with pneumonia: Secondary | ICD-10-CM

## 2017-05-02 DIAGNOSIS — T82898A Other specified complication of vascular prosthetic devices, implants and grafts, initial encounter: Secondary | ICD-10-CM

## 2017-05-02 LAB — CBC
HCT: 30 % — ABNORMAL LOW (ref 39.0–52.0)
HEMOGLOBIN: 9.4 g/dL — AB (ref 13.0–17.0)
MCH: 30 pg (ref 26.0–34.0)
MCHC: 31.3 g/dL (ref 30.0–36.0)
MCV: 95.8 fL (ref 78.0–100.0)
PLATELETS: 349 10*3/uL (ref 150–400)
RBC: 3.13 MIL/uL — ABNORMAL LOW (ref 4.22–5.81)
RDW: 14.8 % (ref 11.5–15.5)
WBC: 8.8 10*3/uL (ref 4.0–10.5)

## 2017-05-02 LAB — COMPREHENSIVE METABOLIC PANEL
ALK PHOS: 92 U/L (ref 38–126)
ALT: 18 U/L (ref 17–63)
ANION GAP: 13 (ref 5–15)
AST: 15 U/L (ref 15–41)
Albumin: 2.2 g/dL — ABNORMAL LOW (ref 3.5–5.0)
BILIRUBIN TOTAL: 0.7 mg/dL (ref 0.3–1.2)
BUN: 41 mg/dL — ABNORMAL HIGH (ref 6–20)
CALCIUM: 7.7 mg/dL — AB (ref 8.9–10.3)
CO2: 26 mmol/L (ref 22–32)
CREATININE: 6.32 mg/dL — AB (ref 0.61–1.24)
Chloride: 95 mmol/L — ABNORMAL LOW (ref 101–111)
GFR calc Af Amer: 9 mL/min — ABNORMAL LOW (ref >60)
GFR calc non Af Amer: 8 mL/min — ABNORMAL LOW (ref >60)
GLUCOSE: 104 mg/dL — AB (ref 65–99)
Potassium: 3.6 mmol/L (ref 3.5–5.1)
Sodium: 134 mmol/L — ABNORMAL LOW (ref 135–145)
TOTAL PROTEIN: 5.4 g/dL — AB (ref 6.5–8.1)

## 2017-05-02 LAB — GLUCOSE, CAPILLARY
GLUCOSE-CAPILLARY: 96 mg/dL (ref 65–99)
Glucose-Capillary: 155 mg/dL — ABNORMAL HIGH (ref 65–99)
Glucose-Capillary: 170 mg/dL — ABNORMAL HIGH (ref 65–99)
Glucose-Capillary: 142 mg/dL — ABNORMAL HIGH (ref 65–99)

## 2017-05-02 LAB — HEPATIC FUNCTION PANEL: Bilirubin, Total: 0.7

## 2017-05-02 MED ORDER — SODIUM CHLORIDE 0.9% FLUSH
10.0000 mL | INTRAVENOUS | Status: DC | PRN
  Administered 2017-05-06: 10 mL
  Filled 2017-05-02: qty 40

## 2017-05-02 MED ORDER — MYCOPHENOLATE MOFETIL 250 MG PO CAPS
500.0000 mg | ORAL_CAPSULE | ORAL | Status: DC
  Administered 2017-05-03 – 2017-05-05 (×2): 500 mg via ORAL
  Filled 2017-05-02 (×2): qty 2

## 2017-05-02 MED ORDER — INSULIN ASPART 100 UNIT/ML ~~LOC~~ SOLN
0.0000 [IU] | Freq: Three times a day (TID) | SUBCUTANEOUS | Status: DC
  Administered 2017-05-02 (×2): 2 [IU] via SUBCUTANEOUS
  Administered 2017-05-03: 1 [IU] via SUBCUTANEOUS
  Administered 2017-05-05: 2 [IU] via SUBCUTANEOUS
  Administered 2017-05-06: 1 [IU] via SUBCUTANEOUS

## 2017-05-02 MED ORDER — CHLORHEXIDINE GLUCONATE CLOTH 2 % EX PADS
6.0000 | MEDICATED_PAD | Freq: Every day | CUTANEOUS | Status: DC
  Administered 2017-05-06 – 2017-05-08 (×2): 6 via TOPICAL

## 2017-05-02 MED ORDER — MUPIROCIN 2 % EX OINT
TOPICAL_OINTMENT | Freq: Two times a day (BID) | CUTANEOUS | Status: DC
  Administered 2017-05-02 – 2017-05-08 (×12): via NASAL
  Filled 2017-05-02 (×2): qty 22

## 2017-05-02 MED ORDER — DARBEPOETIN ALFA 40 MCG/0.4ML IJ SOSY
40.0000 ug | PREFILLED_SYRINGE | INTRAMUSCULAR | Status: DC
  Administered 2017-05-03: 40 ug via INTRAVENOUS

## 2017-05-02 MED ORDER — PIPERACILLIN-TAZOBACTAM 3.375 G IVPB
3.3750 g | Freq: Two times a day (BID) | INTRAVENOUS | Status: DC
  Administered 2017-05-02: 3.375 g via INTRAVENOUS
  Filled 2017-05-02: qty 50

## 2017-05-02 MED ORDER — MYCOPHENOLATE MOFETIL 250 MG PO CAPS
250.0000 mg | ORAL_CAPSULE | ORAL | Status: DC
  Administered 2017-05-02 – 2017-05-07 (×4): 250 mg via ORAL
  Filled 2017-05-02 (×4): qty 1

## 2017-05-02 MED ORDER — VANCOMYCIN HCL 500 MG IV SOLR
500.0000 mg | INTRAVENOUS | Status: DC
  Administered 2017-05-05: 500 mg via INTRAVENOUS
  Filled 2017-05-02 (×4): qty 500

## 2017-05-02 NOTE — Progress Notes (Signed)
Pharmacy Antibiotic Note  [name] [name] is a 76 y.o. male admitted on 05/01/2017 with suspected pneumonia.  Pharmacy has been consulted to change vancomycin and cefepime to Zosyn. CXR with worsened infiltrates, pneumonia and possible cavitation. CT chest with cavitary lesion in RUL.   Patient is ESRD on dialysis and has history of heart transplant on immunosuppression. Of note, patient would have completed his prescription of Augmentin 6/12, which he was discharged with on 6/7 after admission here. His last admission he was seen by ID for "presumed strep bacteremia" which was discovered at an UC visit in [place].    Plan: Zosyn 3.375 g IV q12h to be infused over 4 hours Monitor clinical progress and culture data   Height: 5\' 10"  (177.8 cm) Weight: 135 lb (61.2 kg) IBW/kg (Calculated) : 73  Temp (24hrs), Avg:97.7 F (36.5 C), Min:97.5 F (36.4 C), Max:98.4 F (36.9 C)   Recent Labs Lab 04/26/17 0427 04/26/17 0728 05/01/17 05/01/17 1642 05/01/17 1745 05/01/17 1812 05/02/17 0528  WBC 10.0  --  10.8 9.7 10.9*  --  8.8  CREATININE  --  5.00* 9.4* 4.99* 5.43*  --  6.32*  LATICACIDVEN  --   --   --   --   --  1.29  --     Estimated Creatinine Clearance: 8.6 mL/min (A) (by C-G formula based on SCr of 6.32 mg/dL (H)).    Allergies  Allergen Reactions  . Norvasc [Amlodipine Besylate] Swelling and Other (See Comments)    Swelling of the lower body     Antimicrobials this admission: Vanc 6/12 x1 Cefepime 6/12 x1 Zosyn 6/13 >>   Dose adjustments this admission: n/a  Microbiology results:   6/12 BCx: pending  Renold Genta, PharmD, BCPS Clinical Pharmacist Phone for today - [place] - [phone] 05/02/2017 12:07 PM

## 2017-05-02 NOTE — Progress Notes (Signed)
Pharmacy Antibiotic Note  [name] [name] is a 76 y.o. male admitted on 05/01/2017 with MRSA bacteremia.  Pharmacy has been consulted for vancomycin dosing. Patient with previously reported strep bacteremia from outside hospital in [place]. Upon CCM review of records, noted to actually be MRSA bacteremia. Will resume vancomycin, last dose was 6/12 PM. Patient is ESRD on HD (TTS).  Plan: - Vancomycin 500 mg IV qTTS (HD) - Follow up blood cultures - Monitor renal function, CBC and vancomycin levels as indicated  Height: 5\' 10"  (177.8 cm) Weight: 135 lb (61.2 kg) IBW/kg (Calculated) : 73  Temp (24hrs), Avg:97.8 F (36.6 C), Min:97.5 F (36.4 C), Max:98.4 F (36.9 C)   Recent Labs Lab 04/26/17 0427 04/26/17 0728 05/01/17 05/01/17 1642 05/01/17 1745 05/01/17 1812 05/02/17 0528  WBC 10.0  --  10.8 9.7 10.9*  --  8.8  CREATININE  --  5.00* 9.4* 4.99* 5.43*  --  6.32*  LATICACIDVEN  --   --   --   --   --  1.29  --     Estimated Creatinine Clearance: 8.6 mL/min (A) (by C-G formula based on SCr of 6.32 mg/dL (H)).    Allergies  Allergen Reactions  . Norvasc [Amlodipine Besylate] Swelling and Other (See Comments)    Swelling of the lower body     Antimicrobials this admission: Vancomycin 6/12 >>  Cefepime x1 Zosyn x1  Microbiology results: 6/12 BCx: NG<24h  Thank you for allowing pharmacy to be a part of this patient's care.  Dimitri Ped, PharmD, BCPS PGY-2 Infectious Diseases Pharmacy Resident Pager: [phone] 05/02/2017 4:10 PM

## 2017-05-02 NOTE — Progress Notes (Addendum)
PROGRESS NOTE    [name] [name]  ELF:[id number] DOB: 16-Sep-1941 DOA: 05/01/2017 PCP: Junie Panning, NP     Brief Narrative:  [name] [name] is a 76 year old male with history of high counts 1, ESRD, coronary disease, type 2 diabetes, hypertension, dyslipidemia, recently admitted for presumed streptococcal bacteremia for which he was treated initially with vancomycin and Zosyn and subsequently transitioned to oral Augmentin and discharged to rehabilitation on 6/7, who presents to the ER today via EMS from his dialysis center with complaints of shortness of breath, nonproductive cough 3-4 days. Patient denies any other symptoms of fever, chills, rigors, sore throat. Chest x-ray as well as CT chest were completed which showed multiple pulmonary cavitary lesions. Patient was started on empiric antibiotics.  Assessment & Plan:   Active Problems:   Hypercholesterolemia   Heart transplant status ([place])   DM (diabetes mellitus), type 2 with renal complications (HCC)   Hypothyroidism   ESRD on dialysis ([place])   Protein-calorie malnutrition, severe (HCC)   Anemia of chronic disease   Depression   Generalized weakness   HCAP (healthcare-associated pneumonia)   Pressure injury of skin   Multiple cavitary pulmonary lesions in immunosuppressed  -Recently admitted for presumed strep bacteremia on blood culture from urgent care in [place], culture at [facility] was negative and discharged with Augmentin. TTE 6/6 without vegetation.  -Blood cultures pending -Consult ID for assistance who recommended stop antibiotics, consult pulm for bronch  -Asked cardiology to schedule for TEE   ESRD TThS -Consult nephrology for dialysis  History of heart transplant 2001, follows at [facility] cyclosporine, CellCept  Type 2 diabetes, well controlled -Sliding-scale insulin -Ha1c pending   Hypertension -Continue metoprolol, hydralazine   Dyslipidemia -Continue  pravastatin  Hypothyroidism -Continue levothyroxine  Barrett's esophagus -Continue PPI  Depression, dementia -Continue fluoxetine, Aricept    DVT prophylaxis: heparin subq Code Status: full Family Communication: no family at bedside Disposition Plan: pending improvement   Consultants:   ID  Pulm  Nephro   Procedures:   None  Antimicrobials:  Anti-infectives    Start     Dose/Rate Route Frequency Ordered Stop   05/02/17 1800  ceFEPIme (MAXIPIME) 1 g in dextrose 5 % 50 mL IVPB  Status:  Discontinued     1 g 100 mL/hr over 30 Minutes Intravenous Every 24 hours 05/01/17 1743 05/02/17 1150   05/01/17 1800  vancomycin (VANCOCIN) 1,250 mg in sodium chloride 0.9 % 250 mL IVPB     1,250 mg 166.7 mL/hr over 90 Minutes Intravenous  Once 05/01/17 1727 05/01/17 2101   05/01/17 1715  ceFEPIme (MAXIPIME) 1 g in dextrose 5 % 50 mL IVPB     1 g 100 mL/hr over 30 Minutes Intravenous  Once 05/01/17 1713 05/01/17 1925         Subjective: Patient without any complaints today. He states that he has had a dry cough for a few days as well as shortness of breath. He is a poor historian, does not know why he was brought to the hospital. He is not very clear on the events of last admission. He does not recall if he had any fevers or chills. Denies any chest pain, nausea, vomiting, abdominal pain or diarrhea.  Objective: Vitals:   05/01/17 2017 05/02/17 0219 05/02/17 0605 05/02/17 1034  BP: (!) 189/67 (!) 179/66 (!) 193/71 (!) 111/41  Pulse: 81 72 73 66  Resp: 19  19 18   Temp: 97.5 F (36.4 C)  98.4 F (36.9 C) 97.5  F (36.4 C)  TempSrc: Oral  Oral Oral  SpO2: 100% 98% 100% 100%  Weight: 61.2 kg (135 lb)     Height: 5\' 10"  (1.778 m)       Intake/Output Summary (Last 24 hours) at 05/02/17 1155 Last data filed at 05/02/17 1034  Gross per 24 hour  Intake              170 ml  Output                0 ml  Net              170 ml   Filed Weights   05/01/17 1541 05/01/17 2017   Weight: 61.2 kg (135 lb) 61.2 kg (135 lb)    Examination:  General exam: Appears calm and comfortable  Respiratory system: diminished. Respiratory effort normal, although poor inspiratory effort limiting examination Cardiovascular system: S1 & S2 heard, RRR. No JVD, murmurs, rubs, gallops or clicks. No pedal edema. Gastrointestinal system: Abdomen is nondistended, soft and nontender. No organomegaly or masses felt. Normal bowel sounds heard. Central nervous system: Alert and oriented. No focal neurological deficits.  Extremities: Symmetric 5 x 5 power. Skin: No rashes, lesions or ulcers Psychiatry: Judgement and insight appear normal. Mood & affect appropriate.   Data Reviewed: I have personally reviewed following labs and imaging studies  CBC:  Recent Labs Lab 04/26/17 0427 05/01/17 05/01/17 1642 05/01/17 1745 05/02/17 0528  WBC 10.0 10.8 9.7 10.9* 8.8  NEUTROABS 6.5  --   --   --   --   HGB 9.7* 9.8* 10.0* 8.6* 9.4*  HCT 31.3* 30* 31.6* 27.2* 30.0*  MCV 98.4  --  95.2 95.8 95.8  PLT 209 333 347 389 469   Basic Metabolic Panel:  Recent Labs Lab 04/26/17 0728 05/01/17 05/01/17 1642 05/01/17 1745 05/02/17 0528  NA 132* 132* 135  --  134*  K 3.0* 4.9 3.7  --  3.6  CL 91*  --  94*  --  95*  CO2 25  --  29  --  26  GLUCOSE 142*  --  165*  --  104*  BUN 40* 80* 34*  --  41*  CREATININE 5.00* 9.4* 4.99* 5.43* 6.32*  CALCIUM 8.0*  --  8.2*  --  7.7*  MG  --   --  2.0  --   --   PHOS 3.8  --  3.1  --   --    GFR: Estimated Creatinine Clearance: 8.6 mL/min (A) (by C-G formula based on SCr of 6.32 mg/dL (H)). Liver Function Tests:  Recent Labs Lab 04/26/17 0728 05/01/17 1642 05/02/17 0528  AST  --   --  15  ALT  --   --  18  ALKPHOS  --   --  92  BILITOT  --   --  0.7  PROT  --   --  5.4*  ALBUMIN 2.4* 2.3* 2.2*   No results for input(s): LIPASE, AMYLASE in the last 168 hours. No results for input(s): AMMONIA in the last 168 hours. Coagulation Profile: No  results for input(s): INR, PROTIME in the last 168 hours. Cardiac Enzymes: No results for input(s): CKTOTAL, CKMB, CKMBINDEX, TROPONINI in the last 168 hours. BNP (last 3 results) No results for input(s): PROBNP in the last 8760 hours. HbA1C: No results for input(s): HGBA1C in the last 72 hours. CBG:  Recent Labs Lab 04/25/17 2211 04/26/17 1230 04/26/17 1318 04/26/17 1554 05/02/17 0816  GLUCAP  148* 242* 233* 252* 96   Lipid Profile: No results for input(s): CHOL, HDL, LDLCALC, TRIG, CHOLHDL, LDLDIRECT in the last 72 hours. Thyroid Function Tests:  Recent Labs  05/01/17 1745  TSH 1.951   Anemia Panel: No results for input(s): VITAMINB12, FOLATE, FERRITIN, TIBC, IRON, RETICCTPCT in the last 72 hours. Sepsis Labs:  Recent Labs Lab 05/01/17 1745 05/01/17 1812  PROCALCITON 0.45  --   LATICACIDVEN  --  1.29    No results found for this or any previous visit (from the past 240 hour(s)).     Radiology Studies: Dg Chest 2 View  Result Date: 05/01/2017 CLINICAL DATA:  Shortness of breath and nonproductive cough over the last 3-4 days. EXAM: CHEST  2 VIEW COMPARISON:  04/22/2017 FINDINGS: Previous median sternotomy. Heart size is normal. There is chronic scarring in the right lower lung. There are progressive patchy densities in the right upper lobe and in the left mid lung. In the right upper lobe, there could be some central low density suggesting possible cavitation. No evidence of edema. Pleural scarring on the right. IMPRESSION: Worsened infiltrates in the right upper lung and left mid lung consistent with pneumonia. Question developing central lucency in the right upper lobe region suggesting possible cavitation. Electronically Signed   By: Nelson Chimes M.D.   On: 05/01/2017 17:00   Ct Chest Wo Contrast  Result Date: 05/01/2017 CLINICAL DATA:  Lung lesions EXAM: CT CHEST WITHOUT CONTRAST TECHNIQUE: Multidetector CT imaging of the chest was performed following the standard  protocol without IV contrast. COMPARISON:  Radiographs 05/01/2017, CT thorax 10/05/2014 FINDINGS: Cardiovascular: Limited evaluation without intravenous contrast. Aortic atherosclerosis. Post CABG changes. No significant pericardial effusion. Vascular stent in the left axilla. Mediastinum/Nodes: Mild mediastinal adenopathy, for example right paratracheal lymph node measures 12 mm short axis. Small calcified left hilar lymph nodes. Midline trachea. No thyroid mass. Mild air distention of the esophagus. Lungs/Pleura: Small right pleural effusion or pleural thickening. Mildly loculated pleural effusion along the right pulmonary fissures. Ground-glass density and consolidation in the right upper lobe with cavitation, this measures approximately 4.4 by 3 cm. Multiple mostly peripheral bilateral lung nodules, for example left upper lobe lateral subpleural nodule measures 7 mm, series 6, image number 50. Anterior subpleural left upper lobe nodule measuring 14 mm, series 6, image number 75. Left subpleural, posterior 16 mm nodule with mild cavitation. Multiple additional pulmonary nodules. Upper Abdomen: Atrophic kidneys.  Splenic granuloma. Musculoskeletal: Post sternotomy changes. No acute or suspicious bone lesion. IMPRESSION: 1. Approximate 4.4 cm cavitary lesion in the right upper lobe with surrounding ground-glass density, differential considerations include cavitary infection and cavitary neoplasm. There are multiple additional bilateral pulmonary nodules, some with cavitation. Differential considerations include cavitary infection, septic emboli, and metastatic disease. 2. Small right-sided pleural effusion with loculated fluid along the right pulmonary fissures. 3. Mediastinal adenopathy could be reactive 4. Atrophic kidneys Aortic Atherosclerosis (ICD10-I70.0). Electronically Signed   By: Donavan Foil M.D.   On: 05/01/2017 19:39      Scheduled Meds: . aspirin EC  81 mg Oral QHS  . calcium acetate  667 mg  Oral TID WC  . Chlorhexidine Gluconate Cloth  6 each Topical Q0600  . cycloSPORINE modified  75 mg Oral Daily   And  . cycloSPORINE modified  50 mg Oral q1800  . donepezil  10 mg Oral QHS  . FLUoxetine  20 mg Oral Daily  . heparin  5,000 Units Subcutaneous Q8H  . hydrALAZINE  25 mg Oral TID  .  insulin aspart  0-9 Units Subcutaneous TID WC  . levothyroxine  75 mcg Oral Daily  . metoprolol tartrate  25 mg Oral BID  . mupirocin ointment   Nasal BID  . mycophenolate  250 mg Oral Once per day on Tue Thu Sat   Or  . mycophenolate  500 mg Oral Once per day on Tue Thu Sat  . mycophenolate  500 mg Oral Daily  . pantoprazole  20 mg Oral Daily  . pravastatin  10 mg Oral QHS  . vitamin B-12  1,000 mcg Oral Q breakfast   Continuous Infusions:    LOS: 1 day    Time spent: 40 minutes   Dessa Phi, DO [facility] [email or web address] Password [facility] 05/02/2017, 11:55 AM

## 2017-05-02 NOTE — Consult Note (Signed)
Hospital Consult    Reason for Consult:  Ulcer on fistula Requesting Physician:  Juanell Fairly, NP MRN #:  [id number]  History of Present Illness: This is a 76 y.o. male who was admitted to the hospital yesterday with shortness of breath via EMS from the dialysis center.  His cxr in the ER showed worsening infiltrates in the RUL and left mid lung consistent with PNA and possible central lucency in the RUL suggesting possible cavitary lesion.   He has been placed on broad spectrum abx and admitted for HCAP.    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  He dialyzes T/T/S.  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 Donnetta Hutching.  He has since had balloon angioplasty of the left innominate vein and stent placement in the left axillary vein with 34mm viabahn stent by Dr. Donzetta Matters in 2017.    Pt states that his fistula has not bled to his knowledge.    The pt is on a statin for cholesterol management.  He is on a daily aspirin.  He takes a beta blocker and hydralazine for BP control.  He is on synthroid for hypothyroidism.  Past Medical History:  Diagnosis Date  . Anemia   . Cancer ([place])    SKIN  . CKD III-IV    a. since transplant in 2001 with significant worsening since 06/2013.  Marland Kitchen Coronary artery disease   . Dementia    mild  . Diabetes mellitus without complication (HCC)    FASTING CBG 90S  . Headache(784.0)   . Heart transplanted ([place])    a. 1995 CABG x 4 in [place], [place];  b. 2001 Developed CHF;  c. 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 10/2000 Cardiac transplant @ [facility], [place];  e. Historically nl Bx and stress testing.  Last stress test ~ 5 yrs ago.  Marland Kitchen Hernia of abdominal wall   . History of blood transfusion   . History of stroke   . Hypercalcemia    a. 06/2013 Hospitalized in [place].  Marland Kitchen Hyperlipidemia   . Hypertension    a. Long h/o HTN, came off of all meds following transplant/wt loss-->meds resumed 06/2013, difficult to control since.  . Myocardial infarction ([place])    PRIOR TO HEART TRANSPLANT  . Orthostatic dizziness   . Orthostatic hypotension    a. Has tried support hose and compression stockings - prefers not to wear.  . Shortness of breath    EXERTION  . Stroke ([place]) 06/2013   NO RESIDUAL - WAS CONFUSED AT TIME - THAT HAS RESOLVED    Past Surgical History:  Procedure Laterality Date  . BASCILIC VEIN TRANSPOSITION Left 07/06/2014   Procedure: [place];  Surgeon: Rosetta Posner, MD;  Location: [facility];  Service: Vascular;  Laterality: Left;  . CARDIAC SURGERY    . CATARACT EXTRACTION    . CORONARY ARTERY BYPASS GRAFT    . CRANIOTOMY Right 05/04/2016   Procedure: Right Craniotomy for Evacuation of Subdural Hematoma;  Surgeon: Earnie Larsson, MD;  Location: [facility] NEURO ORS;  Service: Neurosurgery;  Laterality: Right;  . EYE SURGERY Bilateral    CATARACTS  . HEART TRANSPLANT    . PERIPHERAL VASCULAR CATHETERIZATION N/A 09/06/2016   Procedure: Fistulagram - left arm;  Surgeon: Waynetta Sandy, MD;  Location: [facility] CV LAB;  Service: Cardiovascular;  Laterality: N/A;  . PERIPHERAL VASCULAR CATHETERIZATION Left 09/06/2016   Procedure: Peripheral Vascular Intervention;  Surgeon: Waynetta Sandy, MD;  Location: [facility] CV LAB;  Service: Cardiovascular;  Laterality: Left;  UPPER ARM COVERED STENT  . SKIN GRAFT FULL THICKNESS LEG     cat scratch that did not heal  . VASECTOMY      Allergies  Allergen Reactions  . Norvasc [Amlodipine Besylate] Swelling and Other (See Comments)    Swelling of the lower body     Prior to Admission medications   Medication Sig Start Date End Date Taking? Authorizing Provider  acetaminophen (TYLENOL) 325 MG tablet Take 2 tablets (650 mg total) by mouth every 6 (six) hours as needed for mild pain (or Fever >/= 101). 10/14/14   Delfina Redwood, MD  aspirin EC 81 MG tablet Take 81 mg by mouth at bedtime.    [provider]  calcium acetate (PHOSLO) 667 MG capsule Take 1  capsule (667 mg total) by mouth 3 (three) times daily with meals. 08/24/16   Ghimire, Henreitta Leber, MD  cholecalciferol (VITAMIN D) 1000 UNITS tablet Take 1,000 Units by mouth daily.    [provider]  cycloSPORINE modified (NEORAL) 25 MG capsule Take 50-75 mg by mouth See admin instructions. 75 mg in the morning and 50 mg in the evening 08/18/14   Lelon Perla, MD  diphenoxylate-atropine (LOMOTIL) 2.5-0.025 MG tablet Take 1 tablet by mouth 4 (four) times daily as needed for diarrhea or loose stools. 08/24/16   Ghimire, Henreitta Leber, MD  donepezil (ARICEPT) 10 MG tablet Take 10 mg by mouth at bedtime.  03/01/15   [provider]  FLUoxetine (PROZAC) 20 MG capsule Take 20 mg by mouth daily.     [provider]  fluticasone (FLONASE) 50 MCG/ACT nasal spray Place 2 sprays into both nostrils at bedtime as needed for allergies.  04/12/16   [provider]  hydrALAZINE (APRESOLINE) 25 MG tablet Take 1 tablet (25 mg total) by mouth 3 (three) times daily. 04/25/17   Patrecia Pour, MD  lansoprazole (PREVACID) 30 MG capsule Take 30 mg by mouth 2 (two) times daily before a meal.  12/22/15   [provider]  levothyroxine (SYNTHROID, LEVOTHROID) 75 MCG tablet Take 75 mcg by mouth daily.     [provider]  metoprolol tartrate (LOPRESSOR) 25 MG tablet Take 2 tablets (50 mg total) by mouth 2 (two) times daily. 12/27/14   Rai, Vernelle Emerald, MD  multivitamin (RENA-VIT) TABS tablet Take 1 tablet by mouth daily.  03/08/15   [provider]  mycophenolate (CELLCEPT) 250 MG capsule Take 250-500 mg by mouth daily. Take 500 mg every morning and take 250 mg every evening on Sunday, Monday, Wednesday and Friday then take 500 mg every evening on Tuesday Thursday and Saturday    [provider]  Nutritional Supplements (FEEDING SUPPLEMENT, NEPRO CARB STEADY,) LIQD Take 237 mLs by mouth 2 (two) times daily between meals. 04/25/17   Patrecia Pour, MD  pravastatin  (PRAVACHOL) 10 MG tablet Take 10 mg by mouth at bedtime.     [provider]  Probiotic Product (PRO-BIOTIC BLEND) CAPS Take 1 capsule by mouth daily.    [provider]  vitamin B-12 (CYANOCOBALAMIN) 1000 MCG tablet Take 1,000 mcg by mouth daily with breakfast.    [provider]    Social History   Social History  . Marital status: Married    Spouse name: N/A  . Number of children: 1  . Years of education: N/A   Occupational History  . Education officer, community  Social History Main Topics  . Smoking status: Never Smoker  . Smokeless tobacco: Never Used  . Alcohol use No  . Drug use: No  . Sexual activity: Not on file   Other Topics Concern  . Not on file   Social History Narrative   Patient is from [place].  Moved to [place] in late '90's.  Moved to [place] 06/20/2014 to live closer to son.  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  Plans to continue to work now that he is in [place].     Family History  Problem Relation Age of Onset  . Heart attack Father        died @ 56  . Diabetes Father   . Heart attack Mother   . Diabetes Mother   . Kidney disease Maternal Aunt     ROS: [x]  Positive   [ ]  Negative   [ ]  All sytems reviewed and are negative  Cardiac: []  chest pain/pressure []  palpitations [x]  SOB []  DOE  Vascular: []  pain in legs while walking []  pain in legs at rest []  pain in legs at night []  non-healing ulcers []  hx of DVT []  swelling in legs  Pulmonary: []  productive cough []  asthma/wheezing []  home O2  Neurologic: []  weakness in []  arms []  legs []  numbness in []  arms []  legs [x]  hx of CVA []  mini stroke [] difficulty speaking or slurred speech []  temporary loss of vision in one eye []  dizziness   Hematologic: [x]  hx of cancer-skin []  bleeding problems []  problems with blood clotting easily  Endocrine:   []  diabetes [x]  thyroid disease  GI []  vomiting blood []  blood in stool  GU: [x]   CKD/renal failure [x]  HD--[]  M/W/F or [x]  T/T/S []  burning with urination []  blood in urine  Psychiatric: []  anxiety []  depression  Musculoskeletal: []  arthritis []  joint pain  Integumentary: []  rashes []  ulcers  Constitutional: []  fever []  chills   Physical Examination  Vitals:   05/02/17 0605 05/02/17 1034  BP: (!) 193/71 (!) 111/41  Pulse: 73 66  Resp: 19 18  Temp: 98.4 F (36.9 C) 97.5 F (36.4 C)   Body mass index is 19.37 kg/m.  General:  Appearing thin in NAD Gait: Not observed HENT: WNL, normocephalic Pulmonary: normal non-labored breathing on room air Cardiac: regular Abdomen:  soft, NT/ND, no masses Skin: without rashes Vascular Exam/Pulses: Easily palpable thrill; easily palpable left radial pulse.  2 ulcerations over left arm fistula, but skin is mobile over the incision.  Extremities: without ischemic changes, without Gangrene , without cellulitis; without open wounds;  Musculoskeletal: no muscle wasting or atrophy  Neurologic: A&O X 3;  No focal weakness or paresthesias are detected; speech is fluent/normal Psychiatric:  The pt has flat affect.   CBC    Component Value Date/Time   WBC 8.8 05/02/2017 0528   RBC 3.13 (L) 05/02/2017 0528   HGB 9.4 (L) 05/02/2017 0528   HCT 30.0 (L) 05/02/2017 0528   PLT 349 05/02/2017 0528   MCV 95.8 05/02/2017 0528   MCH 30.0 05/02/2017 0528   MCHC 31.3 05/02/2017 0528   RDW 14.8 05/02/2017 0528   LYMPHSABS 2.5 04/26/2017 0427   MONOABS 0.9 04/26/2017 0427   EOSABS 0.1 04/26/2017 0427   BASOSABS 0.0 04/26/2017 0427    BMET    Component Value Date/Time   NA 134 (L) 05/02/2017 0528   NA 132 (A) 05/01/2017   K 3.6 05/02/2017 0528   CL  95 (L) 05/02/2017 0528   CO2 26 05/02/2017 0528   GLUCOSE 104 (H) 05/02/2017 0528   BUN 41 (H) 05/02/2017 0528   BUN 80 (A) 05/01/2017   CREATININE 6.32 (H) 05/02/2017 0528   CALCIUM 7.7 (L) 05/02/2017 0528   GFRNONAA 8 (L) 05/02/2017 0528   GFRAA 9 (L) 05/02/2017  0528    COAGS: Lab Results  Component Value Date   INR 1.02 02/03/2017   INR 1.19 05/04/2016   INR 1.11 12/26/2014     Non-Invasive Vascular Imaging:   none  Statin:  Yes.   Beta Blocker:  Yes.   Aspirin:  Yes.   ACEI:  No. ARB:  No. CCB use:  No Other antiplatelets/anticoagulants:  Yes, SQ heparin   ASSESSMENT/PLAN: This is a 76 y.o. male with left arm fistula with 2 small ulcerations over fistula   -pt has 2 small ulcerations over fistula, but skin is mobile over the fistula.  He denies any bleeding from the fistula.  Dr. Oneida Alar is recommending revision of fistula.  Pt dialyzes on T/T/S & we will plan to schedule this for Friday May 04, 2017.     Leontine Locket, PA-C [facility] [phone]   History and exam findings as above.  Pt has 2 areas one with slightly blistered skin 4 cm from antecubital crease one with slightly ulcerated thinned skin 7 cm from antecubital crease.  Both of these have some risk for bleeding Will schedule pt for excision of these two areas by Dr Early on Friday if pt is overall stable from his pulmonary standpoint.  Ruta Hinds, MD [facility] [place] [facility] Office: [phone] Pager: [phone]

## 2017-05-02 NOTE — Consult Note (Signed)
Name: Fidencio Duddy MRN: [id number] DOB: 10/07/1941    ADMISSION DATE:  05/01/2017 CONSULTATION DATE:  05/02/17  REFERRING MD :  Dr. Maylene Roes   CHIEF COMPLAINT:  Cavitary Lung Lesion    HISTORY OF PRESENT ILLNESS:  76 y/o M who presented 6/12 via EMS from dialysis with reports of shortness of breath, non-productive cough and pain "all over".    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 He was recently admitted from 6/2-6/7 with bacteremia.  The patient was on vacation in [place], [place] the week prior when he noted a wound on his left arm (6/1). He was prescribed Augmentin and topical mupirocin ointment and sent home.  That evening, he became altered and was taken to [facility] in [place], [place] (phone# [phone]).  CT of the head, abdomen and blood cultures obtained.  They left vacation and drove to [facility] for further medical evaluation. He was seen in [place] and found to have bilateral infiltrates worrisome for HCAP.  He was treated with IV vancomycin & zosyn.  Repeat blood cultures at [facility] were negative.  He was evaluated by ID.  Family reported at that time that they thought it was streptococcal bacteremia. He was discharged on Augmentin and instructed to follow up with Dermatology after discharge. He was discharged to a SNF for rehab.    He returns on 6/12 via EMS with SOB, non-productive cough and "pain all over" after HD. He completed 3 full hours of HD.  ER evaluation found him to have worsening infiltrates in the RUL, L mid-lung consistent with PNA with possible cavitary lesion.   PCCM consulted for evaluation of cavitary lesions.     PAST MEDICAL HISTORY :   has a past medical history of Anemia; Cancer [facility]); CKD III-IV; Coronary artery disease; Dementia; Diabetes mellitus without complication ([place]);  Headache(784.0); Heart transplanted ([place]); Hernia of abdominal wall; History of blood transfusion; History of stroke; Hypercalcemia; Hyperlipidemia; Hypertension; Myocardial infarction ([place]); Orthostatic dizziness; Orthostatic hypotension; Shortness of breath; and Stroke ([place]) (06/2013).   has a past surgical history that includes Cardiac surgery; Heart transplant; Cataract extraction; Vasectomy; Skin graft full thickness leg; Coronary artery bypass graft; Eye surgery (Bilateral); Bascilic vein transposition (Left, 07/06/2014); Craniotomy (Right, 05/04/2016); Cardiac catheterization (N/A, 09/06/2016); and Cardiac catheterization (Left, 09/06/2016).  Prior to Admission medications   Medication Sig Start Date End Date Taking? Authorizing Provider  acetaminophen (TYLENOL) 325 MG tablet Take 2 tablets (650 mg total) by mouth every 6 (six) hours as needed for mild pain (or Fever >/= 101). 10/14/14   Delfina Redwood, MD  aspirin EC 81 MG tablet Take 81 mg by mouth at bedtime.    [provider]  calcium acetate (PHOSLO) 667 MG capsule Take 1 capsule (667 mg total) by mouth 3 (three) times daily with meals. 08/24/16   Ghimire, Henreitta Leber, MD  cholecalciferol (VITAMIN D) 1000 UNITS tablet Take 1,000 Units by mouth daily.    [provider]  cycloSPORINE modified (NEORAL) 25 MG capsule Take 50-75 mg by mouth See admin instructions. 75 mg in the morning and 50 mg in the evening 08/18/14   Lelon Perla, MD  diphenoxylate-atropine (LOMOTIL) 2.5-0.025 MG tablet Take 1 tablet by mouth 4 (four) times daily as needed for diarrhea or loose stools. 08/24/16   Ghimire, Henreitta Leber, MD  donepezil (ARICEPT) 10 MG tablet Take  10 mg by mouth at bedtime.  03/01/15   [provider]  FLUoxetine (PROZAC) 20 MG capsule Take 20 mg by mouth daily.     [provider]  fluticasone (FLONASE) 50 MCG/ACT nasal spray Place 2 sprays into both nostrils at bedtime as needed for allergies.  04/12/16    [provider]  hydrALAZINE (APRESOLINE) 25 MG tablet Take 1 tablet (25 mg total) by mouth 3 (three) times daily. 04/25/17   Patrecia Pour, MD  lansoprazole (PREVACID) 30 MG capsule Take 30 mg by mouth 2 (two) times daily before a meal.  12/22/15   [provider]  levothyroxine (SYNTHROID, LEVOTHROID) 75 MCG tablet Take 75 mcg by mouth daily.     [provider]  metoprolol tartrate (LOPRESSOR) 25 MG tablet Take 2 tablets (50 mg total) by mouth 2 (two) times daily. 12/27/14   Rai, Vernelle Emerald, MD  multivitamin (RENA-VIT) TABS tablet Take 1 tablet by mouth daily.  03/08/15   [provider]  mycophenolate (CELLCEPT) 250 MG capsule Take 250-500 mg by mouth daily. Take 500 mg every morning and take 250 mg every evening on Sunday, Monday, Wednesday and Friday then take 500 mg every evening on Tuesday Thursday and Saturday    [provider]  Nutritional Supplements (FEEDING SUPPLEMENT, NEPRO CARB STEADY,) LIQD Take 237 mLs by mouth 2 (two) times daily between meals. 04/25/17   Patrecia Pour, MD  pravastatin (PRAVACHOL) 10 MG tablet Take 10 mg by mouth at bedtime.     [provider]  Probiotic Product (PRO-BIOTIC BLEND) CAPS Take 1 capsule by mouth daily.    [provider]  vitamin B-12 (CYANOCOBALAMIN) 1000 MCG tablet Take 1,000 mcg by mouth daily with breakfast.    [provider]    Allergies  Allergen Reactions  . Norvasc [Amlodipine Besylate] Swelling and Other (See Comments)    Swelling of the lower body     FAMILY HISTORY:  family history includes Diabetes in his father and mother; Heart attack in his father and mother; Kidney disease in his maternal aunt.  SOCIAL HISTORY:  reports that he has never smoked. He has never used smokeless tobacco. He reports that he does not drink alcohol or use drugs.  REVIEW OF SYSTEMS:  POSITIVES IN BOLD Constitutional: Negative for fever, chills, weight loss, malaise/fatigue and  diaphoresis.  HENT: Negative for hearing loss, ear pain, nosebleeds, congestion, sore throat, neck pain, tinnitus and ear discharge.   Eyes: Negative for blurred vision, double vision, photophobia, pain, discharge and redness.  Respiratory: Negative for cough, hemoptysis, sputum production, shortness of breath, wheezing and stridor.   Cardiovascular: Negative for chest pain, palpitations, orthopnea, claudication, leg swelling and PND.  Gastrointestinal: Negative for heartburn, nausea, vomiting, abdominal pain, diarrhea, constipation, blood in stool and melena.  Genitourinary: Negative for dysuria, urgency, frequency, hematuria and flank pain.  Musculoskeletal: Negative for myalgias, back pain, joint pain and falls.  Skin: Negative for itching and rash.  Neurological: Negative for dizziness, tingling, tremors, sensory change, speech change, focal weakness, seizures, loss of consciousness, weakness and headaches.  Endo/Heme/Allergies: Negative for environmental allergies and polydipsia. Does not bruise/bleed easily.  SUBJECTIVE:   VITAL SIGNS: Temp:  [97.5 F (36.4 C)-98.4 F (36.9 C)] 97.5 F (36.4 C) (06/13 1034) Pulse Rate:  [64-81] 66 (06/13 1034) Resp:  [16-22] 18 (06/13 1034) BP: (111-193)/(41-71) 111/41 (06/13 1034) SpO2:  [98 %-100 %] 100 % (06/13 1034) Weight:  [135 lb (61.2 kg)] 135 lb (61.2 kg) (  06/12 2017)  PHYSICAL EXAMINATION: General: frail elderly male in NAD, lying in bed HEENT: MM pink/moist, no jvd, fair dentition PSY: calm/appropriate Neuro: AAOx4, speech clear, MAE CV: s1s2 rrr, no m/r/g PULM: even/non-labored, lungs bilaterally clear VO:ZDGU, non-tender, bsx4 active  Extremities: warm/dry, no edema  Skin: no rashes or lesions    Recent Labs Lab 04/26/17 0728 05/01/17 05/01/17 1642 05/01/17 1745 05/02/17 0528  NA 132* 132* 135  --  134*  K 3.0* 4.9 3.7  --  3.6  CL 91*  --  94*  --  95*  CO2 25  --  29  --  26  BUN 40* 80* 34*  --  41*  CREATININE  5.00* 9.4* 4.99* 5.43* 6.32*  GLUCOSE 142*  --  165*  --  104*     Recent Labs Lab 05/01/17 1642 05/01/17 1745 05/02/17 0528  HGB 10.0* 8.6* 9.4*  HCT 31.6* 27.2* 30.0*  WBC 9.7 10.9* 8.8  PLT 347 389 349    Dg Chest 2 View  Result Date: 05/01/2017 CLINICAL DATA:  Shortness of breath and nonproductive cough over the last 3-4 days. EXAM: CHEST  2 VIEW COMPARISON:  04/22/2017 FINDINGS: Previous median sternotomy. Heart size is normal. There is chronic scarring in the right lower lung. There are progressive patchy densities in the right upper lobe and in the left mid lung. In the right upper lobe, there could be some central low density suggesting possible cavitation. No evidence of edema. Pleural scarring on the right. IMPRESSION: Worsened infiltrates in the right upper lung and left mid lung consistent with pneumonia. Question developing central lucency in the right upper lobe region suggesting possible cavitation. Electronically Signed   By: Nelson Chimes M.D.   On: 05/01/2017 17:00   Ct Chest Wo Contrast  Result Date: 05/01/2017 CLINICAL DATA:  Lung lesions EXAM: CT CHEST WITHOUT CONTRAST TECHNIQUE: Multidetector CT imaging of the chest was performed following the standard protocol without IV contrast. COMPARISON:  Radiographs 05/01/2017, CT thorax 10/05/2014 FINDINGS: Cardiovascular: Limited evaluation without intravenous contrast. Aortic atherosclerosis. Post CABG changes. No significant pericardial effusion. Vascular stent in the left axilla. Mediastinum/Nodes: Mild mediastinal adenopathy, for example right paratracheal lymph node measures 12 mm short axis. Small calcified left hilar lymph nodes. Midline trachea. No thyroid mass. Mild air distention of the esophagus. Lungs/Pleura: Small right pleural effusion or pleural thickening. Mildly loculated pleural effusion along the right pulmonary fissures. Ground-glass density and consolidation in the right upper lobe with cavitation, this  measures approximately 4.4 by 3 cm. Multiple mostly peripheral bilateral lung nodules, for example left upper lobe lateral subpleural nodule measures 7 mm, series 6, image number 50. Anterior subpleural left upper lobe nodule measuring 14 mm, series 6, image number 75. Left subpleural, posterior 16 mm nodule with mild cavitation. Multiple additional pulmonary nodules. Upper Abdomen: Atrophic kidneys.  Splenic granuloma. Musculoskeletal: Post sternotomy changes. No acute or suspicious bone lesion. IMPRESSION: 1. Approximate 4.4 cm cavitary lesion in the right upper lobe with surrounding ground-glass density, differential considerations include cavitary infection and cavitary neoplasm. There are multiple additional bilateral pulmonary nodules, some with cavitation. Differential considerations include cavitary infection, septic emboli, and metastatic disease. 2. Small right-sided pleural effusion with loculated fluid along the right pulmonary fissures. 3. Mediastinal adenopathy could be reactive 4. Atrophic kidneys Aortic Atherosclerosis (ICD10-I70.0). Electronically Signed   By: Donavan Foil M.D.   On: 05/01/2017 19:39      SIGNIFICANT EVENTS  6/12  Admit  STUDIES:  ECHO / TTE  6/6 >> severe LVH, LVEF 55-60%, no RWMA, no evidence of vegetation CT Chest 6/12 >> approximate 4.4 cm cavitary lesion in the RUL with GGO, multiple additional bilateral pulmonary nodules some with cavitation, small right sided pleural effusion with loculated pleural fluid along the R pulmonary fissues, mediastinal adenopathy, atrophic kidneys    ASSESSMENT / PLAN:  Discussion:  76 y/o M, on chronic immune suppression for heart transplant, ESRD admitted with SOB and findings of cavitary lesion / bilateral infiltrates.  He was previously treated as a strep bacteremia during admit from 6/2-6/5 with negative TTE.  Verified cultures with [facility] and he is actually positive for MRSA in 4/4 bottles (fax report is on his  chart).     MRSA Bacteremia  P: Needs Cardiology consultation for TEE to rule out endocarditis > defer to primary Begin Vancomycin per pharmacy  ID following   Bilateral Infiltrates with Cavitation - ddx include MRSA bacteremia with septic emboli, fungal, bacterial infection.  Less likely malignancy.   Immune Suppression  P:  Plan for FOB on 6/15 at 2PM NPO after MN on 2pm (pre bronch orders placed) Follow CXR, fever curve, WBC trend  Send bronchial washing for Galactomannan ([facility] lab order # [id number]), AFB > please make note for lab to look at nocardia and actinomyces, Gram stain, culture Send serum Galactomannan (LOINC Code / [facility] lab order [phone] Note you can have false positive of galactomannan while on zosyn > has not been on since last admission but received 1 dose on 6/13.      Noe Gens, NP-C [facility] Pgr: [phone] or if no answer [phone] 05/02/2017, 2:59 PM   Attending Note:  I have examined patient, reviewed labs, studies and notes. I have discussed the case with B Ollis, and I agree with the data and plans as amended above. 76 year old man with a history of cardiac transplant on cyclosporine and CellCept, also end-stage renal disease, diabetes, hypertension. He is recently treated for what was thought to be a strep bacteremia 6/2 - 6/7. His blood cultures at that time were not available for confirmation. A TTE did not show any evidence of vegetations. He was treated with Augmentin and sent home. He returned today with dyspnea, nonproductive cough and myalgias following dialysis. CT scan of his chest shows several rounded lesions the most prominent is a cavitary lesion in the right upper lobe on my evaluation the patient is sleepy but wakes and interacts. He quickly goes back to sleep if not stimulated. His lungs are distant with some scattered bilateral crackles. Heart is regular I don't hear a murmur. Abdomen benign. No significant edema. His  blood culture results from [place] are now available and these were positive for MRSA in 4 out of 4 bottles. High likelihood that these rounded lesions are cavitary pneumonia is, embolic MRSA. Puts him at high risk for endocarditis. Differential diagnosis here is broad however. Need to consider all opportunistic infections. I agree that he needs bronchoscopy to guide therapy. We will look target atypical, fungal, possible bacterial pathogens on BAL. He will also likely need a TEE to ensure no evidence of endocarditis. Also will need to evaluate his dialysis access for possible infection.   Baltazar Apo, MD, PhD 05/02/2017, 4:42 PM [facility] [phone] or if no answer [phone]

## 2017-05-02 NOTE — Progress Notes (Signed)
[facility] Progress Note   Background: [name] [name] is a 76 Y/O male on hemodialysis T, Th,S at [facility]. PMH significant for heart transplant, 2001 on immunosuppressant therapy, orthostatic hypotension/automonic dysfunction, DMT2, CVA, SDH 2/2 fall with craniotomy for evacuation of SDH 05/04/16,L patellar fx 06/2016, UTI, mild dementia. Patient was admitted 04/22/2017 for streptococcal bacteremia. He was initially treated with Vanc/Zosyn as in-patient then [facility] home on Augmentin. Apparently he presented to ED last PM with SOB and non-productive cough for 3-4 days. CXR showed Worsened infiltrates in the right upper lung and left mid lung consistent with pneumonia with central lucency in the right upper lobe region suggesting possible cavitation. He has been admitted HCAP per primary and started on vanc/zosyn per primary.   Subjective: "I don't feel good". Says he is tired. Alert, calls me by name, knows he is at [facility]. Denies SOB. Not coughing at present.   Objective Vitals:   05/01/17 2017 05/02/17 0219 05/02/17 0605 05/02/17 1034  BP: (!) 189/67 (!) 179/66 (!) 193/71 (!) 111/41  Pulse: 81 72 73 66  Resp: 19  19 18   Temp: 97.5 F (36.4 C)  98.4 F (36.9 C) 97.5 F (36.4 C)  TempSrc: Oral  Oral Oral  SpO2: 100% 98% 100% 100%  Weight: 61.2 kg (135 lb)     Height: 5\' 10"  (1.778 m)      Physical Exam General: Chronically ill appearing male in NAD Heart: S1,S2, RRR No M/R/G Lungs: CTAB A/P decreased in bases posteriorly. No wheezing.  Abdomen: Active BS, non-tender, non-distended. Extremities: No LE edema.  Dialysis Access: LFA AVF with thinning, denuded area middle of AVF. Whistling bruit lower of AVF.    Additional Objective Labs: Basic Metabolic Panel:  Recent Labs Lab 04/26/17 0728 05/01/17 05/01/17 1642 05/01/17 1745 05/02/17 0528  NA 132* 132* 135  --  134*  K 3.0* 4.9 3.7  --  3.6  CL 91*  --  94*  --  95*  CO2 25  --   29  --  26  GLUCOSE 142*  --  165*  --  104*  BUN 40* 80* 34*  --  41*  CREATININE 5.00* 9.4* 4.99* 5.43* 6.32*  CALCIUM 8.0*  --  8.2*  --  7.7*  PHOS 3.8  --  3.1  --   --    Liver Function Tests:  Recent Labs Lab 04/26/17 0728 05/01/17 1642 05/02/17 0528  AST  --   --  15  ALT  --   --  18  ALKPHOS  --   --  92  BILITOT  --   --  0.7  PROT  --   --  5.4*  ALBUMIN 2.4* 2.3* 2.2*   No results for input(s): LIPASE, AMYLASE in the last 168 hours. CBC:  Recent Labs Lab 04/26/17 0427  05/01/17 1642 05/01/17 1745 05/02/17 0528  WBC 10.0  < > 9.7 10.9* 8.8  NEUTROABS 6.5  --   --   --   --   HGB 9.7*  < > 10.0* 8.6* 9.4*  HCT 31.3*  < > 31.6* 27.2* 30.0*  MCV 98.4  --  95.2 95.8 95.8  PLT 209  < > 347 389 349  < > = values in this interval not displayed. Blood Culture    Component Value Date/Time   SDES BLOOD RIGHT ARM 04/22/2017 0215   SPECREQUEST  04/22/2017 0215    BOTTLES DRAWN AEROBIC AND ANAEROBIC Blood Culture adequate  volume   CULT NO GROWTH 5 DAYS 04/22/2017 0215   REPTSTATUS 04/27/2017 FINAL 04/22/2017 0215    Cardiac Enzymes: No results for input(s): CKTOTAL, CKMB, CKMBINDEX, TROPONINI in the last 168 hours. CBG:  Recent Labs Lab 04/26/17 1230 04/26/17 1318 04/26/17 1554 05/02/17 0816 05/02/17 1207  GLUCAP 242* 233* 252* 96 155*   Iron Studies: No results for input(s): IRON, TIBC, TRANSFERRIN, FERRITIN in the last 72 hours. @lablastinr3 @ Studies/Results: Dg Chest 2 View  Result Date: 05/01/2017 CLINICAL DATA:  Shortness of breath and nonproductive cough over the last 3-4 days. EXAM: CHEST  2 VIEW COMPARISON:  04/22/2017 FINDINGS: Previous median sternotomy. Heart size is normal. There is chronic scarring in the right lower lung. There are progressive patchy densities in the right upper lobe and in the left mid lung. In the right upper lobe, there could be some central low density suggesting possible cavitation. No evidence of edema. Pleural  scarring on the right. IMPRESSION: Worsened infiltrates in the right upper lung and left mid lung consistent with pneumonia. Question developing central lucency in the right upper lobe region suggesting possible cavitation. Electronically Signed   By: Nelson Chimes M.D.   On: 05/01/2017 17:00   Ct Chest Wo Contrast  Result Date: 05/01/2017 CLINICAL DATA:  Lung lesions EXAM: CT CHEST WITHOUT CONTRAST TECHNIQUE: Multidetector CT imaging of the chest was performed following the standard protocol without IV contrast. COMPARISON:  Radiographs 05/01/2017, CT thorax 10/05/2014 FINDINGS: Cardiovascular: Limited evaluation without intravenous contrast. Aortic atherosclerosis. Post CABG changes. No significant pericardial effusion. Vascular stent in the left axilla. Mediastinum/Nodes: Mild mediastinal adenopathy, for example right paratracheal lymph node measures 12 mm short axis. Small calcified left hilar lymph nodes. Midline trachea. No thyroid mass. Mild air distention of the esophagus. Lungs/Pleura: Small right pleural effusion or pleural thickening. Mildly loculated pleural effusion along the right pulmonary fissures. Ground-glass density and consolidation in the right upper lobe with cavitation, this measures approximately 4.4 by 3 cm. Multiple mostly peripheral bilateral lung nodules, for example left upper lobe lateral subpleural nodule measures 7 mm, series 6, image number 50. Anterior subpleural left upper lobe nodule measuring 14 mm, series 6, image number 75. Left subpleural, posterior 16 mm nodule with mild cavitation. Multiple additional pulmonary nodules. Upper Abdomen: Atrophic kidneys.  Splenic granuloma. Musculoskeletal: Post sternotomy changes. No acute or suspicious bone lesion. IMPRESSION: 1. Approximate 4.4 cm cavitary lesion in the right upper lobe with surrounding ground-glass density, differential considerations include cavitary infection and cavitary neoplasm. There are multiple additional  bilateral pulmonary nodules, some with cavitation. Differential considerations include cavitary infection, septic emboli, and metastatic disease. 2. Small right-sided pleural effusion with loculated fluid along the right pulmonary fissures. 3. Mediastinal adenopathy could be reactive 4. Atrophic kidneys Aortic Atherosclerosis (ICD10-I70.0). Electronically Signed   By: Donavan Foil M.D.   On: 05/01/2017 19:39   Medications: . piperacillin-tazobactam (ZOSYN)  IV     . aspirin EC  81 mg Oral QHS  . calcium acetate  667 mg Oral TID WC  . Chlorhexidine Gluconate Cloth  6 each Topical Q0600  . cycloSPORINE modified  75 mg Oral Daily   And  . cycloSPORINE modified  50 mg Oral q1800  . donepezil  10 mg Oral QHS  . FLUoxetine  20 mg Oral Daily  . heparin  5,000 Units Subcutaneous Q8H  . hydrALAZINE  25 mg Oral TID  . insulin aspart  0-9 Units Subcutaneous TID WC  . levothyroxine  75 mcg Oral Daily  .  metoprolol tartrate  25 mg Oral BID  . mupirocin ointment   Nasal BID  . mycophenolate  250 mg Oral Once per day on Tue Thu Sat   Or  . mycophenolate  500 mg Oral Once per day on Tue Thu Sat  . mycophenolate  500 mg Oral Daily  . pantoprazole  20 mg Oral Daily  . pravastatin  10 mg Oral QHS  . vitamin B-12  1,000 mcg Oral Q breakfast   Dialysis Orders: [place] T, Th,S 4 hrs 2.0 K/2.25 Ca 61 kg  400/Auto 1.5 -No Heparin  -Venofer 50mg  IV weekly (Last dose 04/12/17 Fe 106 Tsat 45% 04/12/17)    Assessment/Plan: 1. HCAP with multiple cavitary pulmonary lesions in immunosuppressed pt: Per primary. BC pending. On Vanc/Zosyn. ID consulted.  2. ESRD -T, Th, S. HD tomorrow on schedule. No acute needs now. K+ 3.6 Use 4.0 K bath.  3. Anemia - HGB 9.4. No OP ESA. Will start Aranesp 40 mcg IV weekly with HD tomorrow. No venofer until [facility] results final  4. Secondary hyperparathyroidism - cont binders, no VDRA.  5. HTN/volume - EDW lowered to 61 kg post hospitalization-has not been able to reach in HD  center however wt here 61.2 kg bed wt. Does not appear volume overload. BP is always high starting HD, falls during treatment. Run even tomorrow. Continue metoprolol and hydralazine. Also takes amlodipine 5 mg PO Q hs. Will add.  6. Nutrition - Albumin 2.2 Renal/Carb mod diet, prostat and renal vit 7. DM: per primary  Rita H. Brown NP-C 05/02/2017, 12:46 PM  [facility] [phone]  Pt seen, examined and agree w A/P as above.  [name] MD [employer] pager [phone]   05/02/2017, 4:54 PM

## 2017-05-02 NOTE — Consult Note (Signed)
[place] for Infectious Disease  Total days of antibiotics 2         Day 2 Zosyn          Admission Date: 05/01/2017       Reason for Consult:     Referring Physician: Maylene Roes  Active Problems:   Hypercholesterolemia   Heart transplant status ([place])   DM (diabetes mellitus), type 2 with renal complications ([place])   Hypothyroidism   ESRD on dialysis ([place])   Protein-calorie malnutrition, severe ([place])   Anemia of chronic disease   Depression   Generalized weakness   HCAP (healthcare-associated pneumonia)   Pressure injury of skin    HPI: [name] [name] is a 76 y.o. 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 Immunesuppression is cyclosporine plus cellcept. His post transplant course notable for 2R rejection in 2003, switched from azathioprine to cellcept. Also CMV disease for a few years. He has ESRD on HD T,Th,S at [facility]- HD started in Nov 2015 thru LUE fistula. He also has hx of chronic subdural hematoma and hx of recurrents falls, CVA, craniotomy for evacuation of SDH 06/15/217, orthostatic hypotension/autonomic dysfunction, L patellar fx 06/2016, UTI, mild dementia and wears bilateral hearing aids.  He was previously admitted to the hospital on 6/2 with strep bacteremia, leukocytosis and CXR concerning for HCAP with multiple infiltrates. He was on vacation in [place], [place] prior to this admission where he developed rash to B/L UE and went to [facility] for cellulitis. BCx were checked at this time, later positive for strep bacteremia - his family was notified and reported to ED here as instructed. Tmax at that time 100.2, WBC 18.8, BCx did not grow when re-checked at ED presentation. TTE neg for endocarditis and was treated in the hospital with Vanc/Zosyn -> Unasyn --> then D/C'd on Augmentin with last dose on 05/01/17.   He has had progressive non-productive cough and body aches over the last 4-5 days. During dialysis  session on 05/01/2017 he was brought to ED with shortness of breath (resident of [facility]). Denies fevers, chills, sore throat or ear pain. Denies N/V/D. Does not endorse weight loss. WBC upon presentation was 10.8. BCx were re-sampled and he was started empirically on Cefepime and Vancomycin and now maintained on Zosyn with concern for HCAP. Chest XR and CT with multiple cavitary lesions of the lungs and scattered nodules with mediastinal lymphadenopathy. CT in 2015 without concern for pulmonary lesions at that time.   Since admission to the hospital he feels a little improved. Only complaint is a lot of gas from bowels, which is not new for him. WBC 8.8 and has remained afebrile. He has been on antibiotics for 13 days considering previous hospitalization.    Past Medical History:  Diagnosis Date  . Anemia   . Cancer ([place])    SKIN  . CKD III-IV    a. since transplant in 2001 with significant worsening since 06/2013.  Marland Kitchen Coronary artery disease   . Dementia    mild  . Diabetes mellitus without complication (HCC)    FASTING CBG 90S  . Headache(784.0)   . Heart transplanted ([place])    a. 1995 CABG x 4 in [place], [place];  b. 2001 Developed CHF;  c. 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 10/2000 Cardiac transplant @ [facility], [place];  e. Historically nl Bx and stress testing.  Last stress test ~ 5 yrs ago.  Marland Kitchen Hernia  of abdominal wall   . History of blood transfusion   . History of stroke   . Hypercalcemia    a. 06/2013 Hospitalized in [place].  Marland Kitchen Hyperlipidemia   . Hypertension    a. Long h/o HTN, came off of all meds following transplant/wt loss-->meds resumed 06/2013, difficult to control since.  . Myocardial infarction ([place])    PRIOR TO HEART TRANSPLANT  . Orthostatic dizziness   . Orthostatic hypotension    a. Has tried support hose and compression stockings - prefers not to wear.  . Shortness of breath    EXERTION  . Stroke ([place]) 06/2013   NO RESIDUAL - WAS CONFUSED AT TIME - THAT HAS  RESOLVED    Allergies:  Allergies  Allergen Reactions  . Norvasc [Amlodipine Besylate] Swelling and Other (See Comments)    Swelling of the lower body     Current antibiotics: Antibiotics Given (last 72 hours)    Date/Time Action Medication Dose Rate   05/01/17 1814 New Bag/Given   ceFEPIme (MAXIPIME) 1 g in dextrose 5 % 50 mL IVPB 1 g 100 mL/hr   05/01/17 1926 New Bag/Given  [medication not available on the floor on time.]   vancomycin (VANCOCIN) 1,250 mg in sodium chloride 0.9 % 250 mL IVPB 1,250 mg 166.7 mL/hr   05/02/17 1252 New Bag/Given   piperacillin-tazobactam (ZOSYN) IVPB 3.375 g 3.375 g 12.5 mL/hr      MEDICATIONS: . aspirin EC  81 mg Oral QHS  . calcium acetate  667 mg Oral TID WC  . Chlorhexidine Gluconate Cloth  6 each Topical Q0600  . cycloSPORINE modified  75 mg Oral Daily   And  . cycloSPORINE modified  50 mg Oral q1800  . donepezil  10 mg Oral QHS  . FLUoxetine  20 mg Oral Daily  . heparin  5,000 Units Subcutaneous Q8H  . hydrALAZINE  25 mg Oral TID  . insulin aspart  0-9 Units Subcutaneous TID WC  . levothyroxine  75 mcg Oral Daily  . metoprolol tartrate  25 mg Oral BID  . mupirocin ointment   Nasal BID  . mycophenolate  250 mg Oral Once per day on Tue Thu Sat   Or  . mycophenolate  500 mg Oral Once per day on Tue Thu Sat  . mycophenolate  500 mg Oral Daily  . pantoprazole  20 mg Oral Daily  . pravastatin  10 mg Oral QHS  . vitamin B-12  1,000 mcg Oral Q breakfast    Social History  Substance Use Topics  . Smoking status: Never Smoker  . Smokeless tobacco: Never Used  . Alcohol use No    Family History  Problem Relation Age of Onset  . Heart attack Father        died @ 19  . Diabetes Father   . Heart attack Mother   . Diabetes Mother   . Kidney disease Maternal Aunt     Review of Systems - Negative except for what is described in HPI    OBJECTIVE: Temp:  [97.5 F (36.4 C)-98.4 F (36.9 C)] 97.5 F (36.4 C) (06/13 1034) Pulse  Rate:  [64-81] 66 (06/13 1034) Resp:  [16-22] 18 (06/13 1034) BP: (111-193)/(41-71) 111/41 (06/13 1034) SpO2:  [98 %-100 %] 100 % (06/13 1034) Weight:  [135 lb (61.2 kg)] 135 lb (61.2 kg) (06/12 2017)   General appearance: Sleepy during exam. Difficult to keep him awake. No distress and breathing comfortably.  Eyes: conjunctivae/corneas clear. PERRL Throat: lips,  mucosa, and tongue normal; teeth and gums normal Neck: no adenopathy, no JVD and supple, symmetrical, trachea midline Resp: Clear, shallow breaths. Normal rate and effort. Difficult to listen posterior fields d/t falling asleep during exam. No cough demonstrated during exam.  Cardio: regular rate and rhythm, S1, S2 normal, no murmur, click, rub or gallop GI: soft, non-tender; bowel sounds normal; no masses,  no organomegaly Extremities: extremities normal, atraumatic, no cyanosis or edema Pulses: 2+ and symmetric Skin: Skin color, texture, turgor normal. No rashes or lesions or Few b/l healing scaled plaques to forearms. Non-draining.  Lymph nodes: Cervical, supraclavicular, and axillary nodes normal. Neurologic: Alert and oriented X 3 as best as I can tell from conversation.   LABS: Results for orders placed or performed during the hospital encounter of 05/01/17 (from the past 48 hour(s))  CBC     Status: Abnormal   Collection Time: 05/01/17  4:42 PM  Result Value Ref Range   WBC 9.7 4.0 - 10.5 K/uL   RBC 3.32 (L) 4.22 - 5.81 MIL/uL   Hemoglobin 10.0 (L) 13.0 - 17.0 g/dL   HCT 31.6 (L) 39.0 - 52.0 %   MCV 95.2 78.0 - 100.0 fL   MCH 30.1 26.0 - 34.0 pg   MCHC 31.6 30.0 - 36.0 g/dL   RDW 14.6 11.5 - 15.5 %   Platelets 347 150 - 400 K/uL  Renal function panel     Status: Abnormal   Collection Time: 05/01/17  4:42 PM  Result Value Ref Range   Sodium 135 135 - 145 mmol/L   Potassium 3.7 3.5 - 5.1 mmol/L   Chloride 94 (L) 101 - 111 mmol/L   CO2 29 22 - 32 mmol/L   Glucose, Bld 165 (H) 65 - 99 mg/dL   BUN 34 (H) 6 - 20  mg/dL   Creatinine, Ser 4.99 (H) 0.61 - 1.24 mg/dL   Calcium 8.2 (L) 8.9 - 10.3 mg/dL   Phosphorus 3.1 2.5 - 4.6 mg/dL   Albumin 2.3 (L) 3.5 - 5.0 g/dL   GFR calc non Af Amer 10 (L) >60 mL/min   GFR calc Af Amer 12 (L) >60 mL/min    Comment: (NOTE) The eGFR has been calculated using the CKD EPI equation. This calculation has not been validated in all clinical situations. eGFR's persistently <60 mL/min signify possible Chronic Kidney Disease.    Anion gap 12 5 - 15  Magnesium     Status: None   Collection Time: 05/01/17  4:42 PM  Result Value Ref Range   Magnesium 2.0 1.7 - 2.4 mg/dL  Procalcitonin - Baseline     Status: None   Collection Time: 05/01/17  5:45 PM  Result Value Ref Range   Procalcitonin 0.45 ng/mL    Comment:        Interpretation: PCT (Procalcitonin) <= 0.5 ng/mL: Systemic infection (sepsis) is not likely. Local bacterial infection is possible. (NOTE)         ICU PCT Algorithm               Non ICU PCT Algorithm    ----------------------------     ------------------------------         PCT < 0.25 ng/mL                 PCT < 0.1 ng/mL     Stopping of antibiotics            Stopping of antibiotics       strongly encouraged.  strongly encouraged.    ----------------------------     ------------------------------       PCT level decrease by               PCT < 0.25 ng/mL       >= 80% from peak PCT       OR PCT 0.25 - 0.5 ng/mL          Stopping of antibiotics                                             encouraged.     Stopping of antibiotics           encouraged.    ----------------------------     ------------------------------       PCT level decrease by              PCT >= 0.25 ng/mL       < 80% from peak PCT        AND PCT >= 0.5 ng/mL            Continuin g antibiotics                                              encouraged.       Continuing antibiotics            encouraged.    ----------------------------      ------------------------------     PCT level increase compared          PCT > 0.5 ng/mL         with peak PCT AND          PCT >= 0.5 ng/mL             Escalation of antibiotics                                          strongly encouraged.      Escalation of antibiotics        strongly encouraged.   CBC     Status: Abnormal   Collection Time: 05/01/17  5:45 PM  Result Value Ref Range   WBC 10.9 (H) 4.0 - 10.5 K/uL   RBC 2.84 (L) 4.22 - 5.81 MIL/uL   Hemoglobin 8.6 (L) 13.0 - 17.0 g/dL   HCT 27.2 (L) 39.0 - 52.0 %   MCV 95.8 78.0 - 100.0 fL   MCH 30.3 26.0 - 34.0 pg   MCHC 31.6 30.0 - 36.0 g/dL   RDW 14.7 11.5 - 15.5 %   Platelets 389 150 - 400 K/uL  Creatinine, serum     Status: Abnormal   Collection Time: 05/01/17  5:45 PM  Result Value Ref Range   Creatinine, Ser 5.43 (H) 0.61 - 1.24 mg/dL   GFR calc non Af Amer 9 (L) >60 mL/min   GFR calc Af Amer 11 (L) >60 mL/min    Comment: (NOTE) The eGFR has been calculated using the CKD EPI equation. This calculation has not been validated in all clinical situations. eGFR's persistently <60 mL/min signify possible Chronic Kidney Disease.   TSH     Status:  None   Collection Time: 05/01/17  5:45 PM  Result Value Ref Range   TSH 1.951 0.350 - 4.500 uIU/mL    Comment: Performed by a 3rd Generation assay with a functional sensitivity of <=0.01 uIU/mL.  I-Stat CG4 Lactic Acid, ED     Status: None   Collection Time: 05/01/17  6:12 PM  Result Value Ref Range   Lactic Acid, Venous 1.29 0.5 - 1.9 mmol/L  Comprehensive metabolic panel     Status: Abnormal   Collection Time: 05/02/17  5:28 AM  Result Value Ref Range   Sodium 134 (L) 135 - 145 mmol/L   Potassium 3.6 3.5 - 5.1 mmol/L   Chloride 95 (L) 101 - 111 mmol/L   CO2 26 22 - 32 mmol/L   Glucose, Bld 104 (H) 65 - 99 mg/dL   BUN 41 (H) 6 - 20 mg/dL   Creatinine, Ser 6.32 (H) 0.61 - 1.24 mg/dL   Calcium 7.7 (L) 8.9 - 10.3 mg/dL   Total Protein 5.4 (L) 6.5 - 8.1 g/dL   Albumin 2.2  (L) 3.5 - 5.0 g/dL   AST 15 15 - 41 U/L   ALT 18 17 - 63 U/L   Alkaline Phosphatase 92 38 - 126 U/L   Total Bilirubin 0.7 0.3 - 1.2 mg/dL   GFR calc non Af Amer 8 (L) >60 mL/min   GFR calc Af Amer 9 (L) >60 mL/min    Comment: (NOTE) The eGFR has been calculated using the CKD EPI equation. This calculation has not been validated in all clinical situations. eGFR's persistently <60 mL/min signify possible Chronic Kidney Disease.    Anion gap 13 5 - 15  CBC     Status: Abnormal   Collection Time: 05/02/17  5:28 AM  Result Value Ref Range   WBC 8.8 4.0 - 10.5 K/uL   RBC 3.13 (L) 4.22 - 5.81 MIL/uL   Hemoglobin 9.4 (L) 13.0 - 17.0 g/dL   HCT 30.0 (L) 39.0 - 52.0 %   MCV 95.8 78.0 - 100.0 fL   MCH 30.0 26.0 - 34.0 pg   MCHC 31.3 30.0 - 36.0 g/dL   RDW 14.8 11.5 - 15.5 %   Platelets 349 150 - 400 K/uL  Glucose, capillary     Status: None   Collection Time: 05/02/17  8:16 AM  Result Value Ref Range   Glucose-Capillary 96 65 - 99 mg/dL  Glucose, capillary     Status: Abnormal   Collection Time: 05/02/17 12:07 PM  Result Value Ref Range   Glucose-Capillary 155 (H) 65 - 99 mg/dL    MICRO: BCx 6/12 >> pending  IMAGING: Dg Chest 2 View  Result Date: 05/01/2017 CLINICAL DATA:  Shortness of breath and nonproductive cough over the last 3-4 days. EXAM: CHEST  2 VIEW COMPARISON:  04/22/2017 FINDINGS: Previous median sternotomy. Heart size is normal. There is chronic scarring in the right lower lung. There are progressive patchy densities in the right upper lobe and in the left mid lung. In the right upper lobe, there could be some central low density suggesting possible cavitation. No evidence of edema. Pleural scarring on the right. IMPRESSION: Worsened infiltrates in the right upper lung and left mid lung consistent with pneumonia. Question developing central lucency in the right upper lobe region suggesting possible cavitation. Electronically Signed   By: Nelson Chimes M.D.   On: 05/01/2017  17:00   Ct Chest Wo Contrast  Result Date: 05/01/2017 CLINICAL DATA:  Lung lesions EXAM: CT  CHEST WITHOUT CONTRAST TECHNIQUE: Multidetector CT imaging of the chest was performed following the standard protocol without IV contrast. COMPARISON:  Radiographs 05/01/2017, CT thorax 10/05/2014 FINDINGS: Cardiovascular: Limited evaluation without intravenous contrast. Aortic atherosclerosis. Post CABG changes. No significant pericardial effusion. Vascular stent in the left axilla. Mediastinum/Nodes: Mild mediastinal adenopathy, for example right paratracheal lymph node measures 12 mm short axis. Small calcified left hilar lymph nodes. Midline trachea. No thyroid mass. Mild air distention of the esophagus. Lungs/Pleura: Small right pleural effusion or pleural thickening. Mildly loculated pleural effusion along the right pulmonary fissures. Ground-glass density and consolidation in the right upper lobe with cavitation, this measures approximately 4.4 by 3 cm. Multiple mostly peripheral bilateral lung nodules, for example left upper lobe lateral subpleural nodule measures 7 mm, series 6, image number 50. Anterior subpleural left upper lobe nodule measuring 14 mm, series 6, image number 75. Left subpleural, posterior 16 mm nodule with mild cavitation. Multiple additional pulmonary nodules. Upper Abdomen: Atrophic kidneys.  Splenic granuloma. Musculoskeletal: Post sternotomy changes. No acute or suspicious bone lesion. IMPRESSION: 1. Approximate 4.4 cm cavitary lesion in the right upper lobe with surrounding ground-glass density, differential considerations include cavitary infection and cavitary neoplasm. There are multiple additional bilateral pulmonary nodules, some with cavitation. Differential considerations include cavitary infection, septic emboli, and metastatic disease. 2. Small right-sided pleural effusion with loculated fluid along the right pulmonary fissures. 3. Mediastinal adenopathy could be reactive 4.  Atrophic kidneys Aortic Atherosclerosis (ICD10-I70.0). Electronically Signed   By: Donavan Foil M.D.   On: 05/01/2017 19:39    HISTORICAL MICRO/IMAGING  Assessment/Plan:  76 yo male s/p heart transplant 2001 that is admitted with shortness of breath, new cavitary lung lesions and bilateral nodules.   Would stop antibiotics at this point as he is non-toxic and stable for further work up and evaluation. Would recommend that pulmonary see him as a bronchoscopy would be helpful.   1. Cavitary Lung Lesions =  - Differentials could include infectious (including fungal) vs malignant processes. No endocarditis identified on previous TTE in setting of streptococcal bacteremia (did not have + BCx here only reported from [place]). - Would stop antibiotics at this point for further work up and evaluation as he is non-toxic, afebrile and stable  - Would recommend that he be seen by pulmonary team as a bronchoscopy would he helpful   2. S/P OHT, 2001 =  - Continues on his immunosuppressants  3. ESRD =  - On HD T/Th/Sat via [facility]   Janene Madeira, MSN, NP-C [facility] for Infectious [place] Cell: [phone] Pager: [phone]  05/02/2017 2:13 PM

## 2017-05-03 DIAGNOSIS — B9562 Methicillin resistant Staphylococcus aureus infection as the cause of diseases classified elsewhere: Secondary | ICD-10-CM

## 2017-05-03 DIAGNOSIS — R7881 Bacteremia: Secondary | ICD-10-CM

## 2017-05-03 LAB — BASIC METABOLIC PANEL
Anion gap: 13 (ref 5–15)
BUN: 49 mg/dL — ABNORMAL HIGH (ref 6–20)
BUN: 49 — AB (ref 4–21)
CALCIUM: 8 mg/dL — AB (ref 8.9–10.3)
CHLORIDE: 96 mmol/L — AB (ref 101–111)
CO2: 27 mmol/L (ref 22–32)
CREATININE: 7.4 — AB (ref 0.6–1.3)
CREATININE: 7.52 mg/dL — AB (ref 0.61–1.24)
GFR calc non Af Amer: 6 mL/min — ABNORMAL LOW (ref >60)
GFR, EST AFRICAN AMERICAN: 7 mL/min — AB (ref >60)
GLUCOSE: 122
Glucose, Bld: 103 mg/dL — ABNORMAL HIGH (ref 65–99)
POTASSIUM: 4 (ref 3.4–5.3)
Potassium: 4 mmol/L (ref 3.5–5.1)
SODIUM: 136 mmol/L (ref 135–145)
Sodium: 136 — AB (ref 137–147)

## 2017-05-03 LAB — CBC WITH DIFFERENTIAL/PLATELET
BASOS PCT: 1 %
Basophils Absolute: 0.1 10*3/uL (ref 0.0–0.1)
EOS ABS: 0.1 10*3/uL (ref 0.0–0.7)
Eosinophils Relative: 1 %
HCT: 27.9 % — ABNORMAL LOW (ref 39.0–52.0)
HEMOGLOBIN: 8.8 g/dL — AB (ref 13.0–17.0)
Lymphocytes Relative: 28 %
Lymphs Abs: 2.2 10*3/uL (ref 0.7–4.0)
MCH: 30.3 pg (ref 26.0–34.0)
MCHC: 31.5 g/dL (ref 30.0–36.0)
MCV: 96.2 fL (ref 78.0–100.0)
MONOS PCT: 10 %
Monocytes Absolute: 0.8 10*3/uL (ref 0.1–1.0)
NEUTROS PCT: 60 %
Neutro Abs: 4.7 10*3/uL (ref 1.7–7.7)
Platelets: 362 10*3/uL (ref 150–400)
RBC: 2.9 MIL/uL — ABNORMAL LOW (ref 4.22–5.81)
RDW: 14.7 % (ref 11.5–15.5)
WBC: 7.7 10*3/uL (ref 4.0–10.5)

## 2017-05-03 LAB — RENAL FUNCTION PANEL
Albumin: 2.1 g/dL — ABNORMAL LOW (ref 3.5–5.0)
Anion gap: 14 (ref 5–15)
BUN: 49 mg/dL — ABNORMAL HIGH (ref 6–20)
CO2: 26 mmol/L (ref 22–32)
Calcium: 7.9 mg/dL — ABNORMAL LOW (ref 8.9–10.3)
Chloride: 96 mmol/L — ABNORMAL LOW (ref 101–111)
Creatinine, Ser: 7.36 mg/dL — ABNORMAL HIGH (ref 0.61–1.24)
GFR calc Af Amer: 7 mL/min — ABNORMAL LOW (ref >60)
GFR calc non Af Amer: 6 mL/min — ABNORMAL LOW (ref >60)
Glucose, Bld: 122 mg/dL — ABNORMAL HIGH (ref 65–99)
Phosphorus: 5.2 mg/dL — ABNORMAL HIGH (ref 2.5–4.6)
Potassium: 4 mmol/L (ref 3.5–5.1)
Sodium: 136 mmol/L (ref 135–145)

## 2017-05-03 LAB — GLUCOSE, CAPILLARY
GLUCOSE-CAPILLARY: 144 mg/dL — AB (ref 65–99)
GLUCOSE-CAPILLARY: 112 mg/dL — AB (ref 65–99)
Glucose-Capillary: 106 mg/dL — ABNORMAL HIGH (ref 65–99)

## 2017-05-03 LAB — CBC AND DIFFERENTIAL
HCT: 28 — AB (ref 41–53)
Hemoglobin: 8.8 — AB (ref 13.5–17.5)
Platelets: 362 (ref 150–399)
WBC: 7.7

## 2017-05-03 LAB — PROCALCITONIN: PROCALCITONIN: 0.35 ng/mL

## 2017-05-03 LAB — HEMOGLOBIN A1C
HEMOGLOBIN A1C: 6.3 % — AB (ref 4.8–5.6)
Mean Plasma Glucose: 134 mg/dL

## 2017-05-03 MED ORDER — LEVOTHYROXINE SODIUM 75 MCG PO TABS
75.0000 ug | ORAL_TABLET | Freq: Every day | ORAL | Status: DC
  Administered 2017-05-04 – 2017-05-08 (×5): 75 ug via ORAL
  Filled 2017-05-03 (×6): qty 1

## 2017-05-03 MED ORDER — ALTEPLASE 2 MG IJ SOLR: 2.0000 mg | Freq: Once | INTRAMUSCULAR | Status: DC | PRN

## 2017-05-03 MED ORDER — SODIUM CHLORIDE 0.9 % IV SOLN: 100.0000 mL | INTRAVENOUS | Status: DC | PRN

## 2017-05-03 MED ORDER — HEPARIN SODIUM (PORCINE) 1000 UNIT/ML DIALYSIS: 1000.0000 [IU] | INTRAMUSCULAR | Status: DC | PRN

## 2017-05-03 MED ORDER — PHENYLEPHRINE HCL 0.25 % NA SOLN
1.0000 | Freq: Four times a day (QID) | NASAL | Status: DC | PRN
  Filled 2017-05-03: qty 15

## 2017-05-03 MED ORDER — ACETAMINOPHEN 325 MG PO TABS
ORAL_TABLET | ORAL | Status: AC
  Filled 2017-05-03: qty 2

## 2017-05-03 MED ORDER — DEXTROSE 5 % IV SOLN
1.5000 g | INTRAVENOUS | Status: AC
  Administered 2017-05-04: 1.5 g via INTRAVENOUS
  Filled 2017-05-03 (×2): qty 1.5

## 2017-05-03 MED ORDER — LIDOCAINE HCL 2 % EX GEL
1.0000 "application " | Freq: Once | CUTANEOUS | Status: DC
  Filled 2017-05-03: qty 5

## 2017-05-03 MED ORDER — PENTAFLUOROPROP-TETRAFLUOROETH EX AERO: 1.0000 "application " | INHALATION_SPRAY | CUTANEOUS | Status: DC | PRN

## 2017-05-03 MED ORDER — LIDOCAINE-PRILOCAINE 2.5-2.5 % EX CREA: 1.0000 "application " | TOPICAL_CREAM | CUTANEOUS | Status: DC | PRN

## 2017-05-03 MED ORDER — VANCOMYCIN HCL IN DEXTROSE 500-5 MG/100ML-% IV SOLN
INTRAVENOUS | Status: AC
  Administered 2017-05-03: 500 mg
  Filled 2017-05-03: qty 100

## 2017-05-03 MED ORDER — DARBEPOETIN ALFA 40 MCG/0.4ML IJ SOSY
PREFILLED_SYRINGE | INTRAMUSCULAR | Status: AC
  Filled 2017-05-03: qty 0.4

## 2017-05-03 MED ORDER — BUTAMBEN-TETRACAINE-BENZOCAINE 2-2-14 % EX AERO
1.0000 | INHALATION_SPRAY | Freq: Once | CUTANEOUS | Status: DC
  Filled 2017-05-03: qty 20

## 2017-05-03 MED ORDER — LIDOCAINE HCL (PF) 1 % IJ SOLN: 5.0000 mL | INTRAMUSCULAR | Status: DC | PRN

## 2017-05-03 NOTE — Progress Notes (Signed)
[facility] Attending Note  ADMISSION DATE:  05/01/2017  CONSULTATION DATE:  05/02/2017  REFERRING MD :  Dr. Maylene Roes   CHIEF COMPLAINT:  Cavitary Lung Lesion   Presenting HPI:  76 y.o. 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 He was recently admitted from 6/2-6/7 with bacteremia.  The patient was on vacation in [place], [place] the week prior when he noted a wound on his left arm (6/1). He was prescribed Augmentin and topical mupirocin ointment and sent home.  That evening, he became altered and was taken to [facility] in [place], [place] (phone# [phone]).  CT of the head, abdomen and blood cultures obtained.  They left vacation and drove to [facility] for further medical evaluation. He was seen in [place] and found to have bilateral infiltrates worrisome for HCAP.  He was treated with IV vancomycin & zosyn.  Repeat blood cultures at [facility] were negative.  He was evaluated by ID.  Family reported at that time that they thought it was streptococcal bacteremia. He was discharged on Augmentin and instructed to follow up with Dermatology after discharge. He was discharged to a SNF for rehab.   returned with shortness of breath and nonproductive cough as well as diffuse pain. Patient found to have cavitary lung lesions prompting consultation of the Pulmonary Service.  Subjective:  Patient denies any chest pain or pressure. He reports mild, nonproductive cough. Denies any new dyspnea.  Review of Systems:  No subjective fever or chills. No abdominal pain or nausea.  Temp:  [97.9 F (36.6 C)-98.1 F (36.7 C)] 98 F (36.7 C) (06/14 1059) Pulse Rate:  [68-79] 78 (06/14 1059) Resp:  [12-19] 18 (06/14 1059) BP: (102-167)/(33-117) 104/78 (06/14 1059) SpO2:  [99 %-100 %] 99 % (06/14 1059) Weight:  [140 lb (63.5 kg)-145 lb 8.1 oz  (66 kg)] 143 lb 4.8 oz (65 kg) (06/14 1059)  General:  Awake. No distress. Alert. Seen immediately following hemodialysis. Integument:  Warm & dry. No rash on exposed skin. HEENT:  No scleral icterus or injection. Pupils symmetric.  Pulmonary:  Clear bilaterally to auscultation. No accessory muscle use on room air. Good aeration bilaterally.  Cardiovascular:  Regular rate. No JVD apprecaited. No edema. Abdomen:  Soft. Nontender. Normal bowel sounds. Neurological:  Cranial nerves grossly in tact. No meningismus. Moving all 4 extremities equally. Oriented x4.   CBC Latest Ref Rng & Units 05/03/2017 05/02/2017 05/01/2017  WBC 4.0 - 10.5 K/uL 7.7 8.8 10.9(H)  Hemoglobin 13.0 - 17.0 g/dL 8.8(L) 9.4(L) 8.6(L)  Hematocrit 39.0 - 52.0 % 27.9(L) 30.0(L) 27.2(L)  Platelets 150 - 400 K/uL 362 349 389   BMP Latest Ref Rng & Units 05/03/2017 05/03/2017 05/02/2017  Glucose 65 - 99 mg/dL 122(H) 103(H) 104(H)  BUN 6 - 20 mg/dL 49(H) 49(H) 41(H)  Creatinine 0.61 - 1.24 mg/dL 7.36(H) 7.52(H) 6.32(H)  Sodium 135 - 145 mmol/L 136 136 134(L)  Potassium 3.5 - 5.1 mmol/L 4.0 4.0 3.6  Chloride 101 - 111 mmol/L 96(L) 96(L) 95(L)  CO2 22 - 32 mmol/L 26 27 26   Calcium 8.9 - 10.3 mg/dL 7.9(L) 8.0(L) 7.7(L)    IMAGING/STUDIES: CT CHEST W/O 6/12:  Personally reviewed by me. No pathologic mediastinal adenopathy. No pericardial effusion. Very small pleural effusion on the right versus pleural thickening with some calcification. Cavitary opacity within the lateral, subpleural right upper lobe as well  as nodular opacities within left upper lobe and lingula at least one of which is cavitary. Splenic calcification noted.  MICROBIOLOGY: Blood Cultures x2 6/3:  Negative MRSA PCR 6/3:  Positive Blood Cultures x2 6/12 >>>  ANTIBIOTICS: Cefepime 6/12 (x1 dose) Zosyn 6/13 (x1 dose) Cefuroxime 6/15 (x1 dose for surgical prophylaxis) Vancomycin 6/12 >>>  ASSESSMENT/PLAN:  76 y.o. male with end-stage renal disease status post  heart transplant on chronic immunosuppression. Previously patient had a bacteremia earlier this month. Reviewing his imaging these lesions are upper lobe predominant making septic emboli somewhat less likely; although, there are peripheral distribution does lend itself more towards a septic embolic pattern. I'm also concerned about the possibility of an invasive fungal process or even Noardia. I discussed bronchoscopy with the patient today as well as the risks of the procedure including bleeding, infection, pneumothorax, medication allergy, vocal cord injury, and potentially death. The patient is willing to undergo the procedure.  1. Pulmonary nodules cavitation: Patient planned for bronchoscopy with lavage tomorrow at 2 PM. 2. Recent bacteremia: Patient planned for transesophageal echocardiogram on Monday. Repeat blood cultures pending. Antibiotics per primary service and other consultants.  I have spent a total of 36 minutes of time today caring for the patient, reviewing the patient's electronic medical record, and with more than 50% of that time spent coordinating care with the patient as well as reviewing the continuing plan of care with the patient at bedside.  Remainder of care as per primary service and other consultants.  Sonia Baller Ashok Cordia, M.D. [facility] Pulmonary & Critical Care Pager:  [phone] After 3pm or if no response, call [phone] 1:00 PM 05/03/17

## 2017-05-03 NOTE — Progress Notes (Addendum)
PROGRESS NOTE    [name] [name]  QAS:[id number] DOB: 11-Dec-1940 DOA: 05/01/2017 PCP: Junie Panning, NP     Brief Narrative:  [name] [name] is a 76 year old male with history of high counts 1, ESRD, coronary disease, type 2 diabetes, hypertension, dyslipidemia, recently admitted for presumed streptococcal bacteremia for which he was treated initially with vancomycin and Zosyn and subsequently transitioned to oral Augmentin and discharged to rehabilitation on 6/7, who presents to the ER today via EMS from his dialysis center with complaints of shortness of breath, nonproductive cough 3-4 days. Patient denies any other symptoms of fever, chills, rigors, sore throat. Chest x-ray as well as CT chest were completed which showed multiple pulmonary cavitary lesions. Patient was started on empiric antibiotics. ID, Pulm have been consulted.   Assessment & Plan:   Active Problems:   Hypercholesterolemia   Heart transplant status ([place])   DM (diabetes mellitus), type 2 with renal complications (HCC)   Hypothyroidism   ESRD on dialysis ([place])   Protein-calorie malnutrition, severe (HCC)   Anemia of chronic disease   Depression   Generalized weakness   HCAP (healthcare-associated pneumonia)   Pressure injury of skin   Cavitary lesion of lung   Multiple cavitary pulmonary lesions in immunosuppressed  -Recently admitted for presumed strep bacteremia on blood culture from urgent care in [place], culture at [facility] was negative and discharged with Augmentin. TTE 6/6 without vegetation. Initially thought strep but actually it was MRSA (see scanned report from 04/27/2017 Correspondence with [facility])  -Blood cultures pending -ID following -Pulm following, bronchoscopy scheduled 6/15 -TEE scheduled 6/18  -Vanco  -Vascular surgery planning left arm fistula revision due to 2 small ulcerations over fistula 6/15   ESRD TThS -Nephrology for dialysis  History of heart transplant 2001,  follows at [place] cyclosporine, CellCept  Type 2 diabetes, well controlled -Sliding-scale insulin -Ha1c 6.3   Hypertension -Continue metoprolol, hydralazine   Dyslipidemia -Continue pravastatin  Hypothyroidism -Continue levothyroxine  Barrett's esophagus -Continue PPI  Depression, dementia -Continue fluoxetine, Aricept    DVT prophylaxis: heparin subq Code Status: full Family Communication: no family at bedside, attempted to contact son over the phone with no response, spoke with wife over the phone today with updates  Disposition Plan: pending improvement   Consultants:   ID  Pulm  Nephro   Procedures:   None  Antimicrobials:  Anti-infectives    Start     Dose/Rate Route Frequency Ordered Stop   05/04/17 1400  cefUROXime (ZINACEF) 1.5 g in dextrose 5 % 50 mL IVPB     1.5 g 100 mL/hr over 30 Minutes Intravenous To Short Stay 05/03/17 0802 05/05/17 1400   05/03/17 1200  vancomycin (VANCOCIN) 500 mg in sodium chloride 0.9 % 100 mL IVPB     500 mg 100 mL/hr over 60 Minutes Intravenous Every T-Th-Sa (Hemodialysis) 05/02/17 1619     05/03/17 0855  vancomycin (VANCOCIN) 500-5 MG/100ML-% IVPB    Comments:  Cherylann Banas   : cabinet override      05/03/17 0855 05/03/17 1006   05/02/17 1800  ceFEPIme (MAXIPIME) 1 g in dextrose 5 % 50 mL IVPB  Status:  Discontinued     1 g 100 mL/hr over 30 Minutes Intravenous Every 24 hours 05/01/17 1743 05/02/17 1150   05/02/17 1300  piperacillin-tazobactam (ZOSYN) IVPB 3.375 g  Status:  Discontinued     3.375 g 12.5 mL/hr over 240 Minutes Intravenous Every 12 hours 05/02/17 1206 05/02/17 1325   05/01/17  1800  vancomycin (VANCOCIN) 1,250 mg in sodium chloride 0.9 % 250 mL IVPB     1,250 mg 166.7 mL/hr over 90 Minutes Intravenous  Once 05/01/17 1727 05/01/17 2101   05/01/17 1715  ceFEPIme (MAXIPIME) 1 g in dextrose 5 % 50 mL IVPB     1 g 100 mL/hr over 30 Minutes Intravenous  Once 05/01/17 1713 05/01/17 1925           Subjective: Patient without any complaints today. States he feels well. No chest pain or SOB. No cough.   Objective: Vitals:   05/03/17 0930 05/03/17 1000 05/03/17 1030 05/03/17 1059  BP: (!) 140/101 (!) 140/110 (!) 136/117 104/78  Pulse: 79 75 77 78  Resp: 18   18  Temp:    98 F (36.7 C)  TempSrc:    Oral  SpO2:    99%  Weight:    65 kg (143 lb 4.8 oz)  Height:        Intake/Output Summary (Last 24 hours) at 05/03/17 1249 Last data filed at 05/03/17 1059  Gross per 24 hour  Intake              380 ml  Output             1000 ml  Net             -620 ml   Filed Weights   05/03/17 0101 05/03/17 0659 05/03/17 1059  Weight: 63.5 kg (140 lb) 66 kg (145 lb 8.1 oz) 65 kg (143 lb 4.8 oz)    Examination:  General exam: Appears calm and comfortable  Respiratory system: diminished. Respiratory effort normal, although poor inspiratory effort limiting examination Cardiovascular system: S1 & S2 heard, RRR. No JVD, murmurs, rubs, gallops or clicks. No pedal edema. Gastrointestinal system: Abdomen is nondistended, soft and nontender. No organomegaly or masses felt. Normal bowel sounds heard. Central nervous system: Alert and oriented. No focal neurological deficits.  Extremities: Symmetric 5 x 5 power. Skin: No rashes, lesions or ulcers Psychiatry: Judgement and insight appear normal. Mood & affect appropriate.   Data Reviewed: I have personally reviewed following labs and imaging studies  CBC:  Recent Labs Lab 05/01/17 05/01/17 1642 05/01/17 1745 05/02/17 0528 05/03/17 0603  WBC 10.8 9.7 10.9* 8.8 7.7  NEUTROABS  --   --   --   --  4.7  HGB 9.8* 10.0* 8.6* 9.4* 8.8*  HCT 30* 31.6* 27.2* 30.0* 27.9*  MCV  --  95.2 95.8 95.8 96.2  PLT 333 347 389 349 527   Basic Metabolic Panel:  Recent Labs Lab 05/01/17 05/01/17 1642 05/01/17 1745 05/02/17 0528 05/03/17 0603 05/03/17 0713  NA 132* 135  --  134* 136 136  K 4.9 3.7  --  3.6 4.0 4.0  CL  --  94*  --  95*  96* 96*  CO2  --  29  --  26 27 26   GLUCOSE  --  165*  --  104* 103* 122*  BUN 80* 34*  --  41* 49* 49*  CREATININE 9.4* 4.99* 5.43* 6.32* 7.52* 7.36*  CALCIUM  --  8.2*  --  7.7* 8.0* 7.9*  MG  --  2.0  --   --   --   --   PHOS  --  3.1  --   --   --  5.2*   GFR: Estimated Creatinine Clearance: 7.9 mL/min (A) (by C-G formula based on SCr of 7.36 mg/dL (H)). Liver Function  Tests:  Recent Labs Lab 05/01/17 1642 05/02/17 0528 05/03/17 0713  AST  --  15  --   ALT  --  18  --   ALKPHOS  --  92  --   BILITOT  --  0.7  --   PROT  --  5.4*  --   ALBUMIN 2.3* 2.2* 2.1*   No results for input(s): LIPASE, AMYLASE in the last 168 hours. No results for input(s): AMMONIA in the last 168 hours. Coagulation Profile: No results for input(s): INR, PROTIME in the last 168 hours. Cardiac Enzymes: No results for input(s): CKTOTAL, CKMB, CKMBINDEX, TROPONINI in the last 168 hours. BNP (last 3 results) No results for input(s): PROBNP in the last 8760 hours. HbA1C:  Recent Labs  05/02/17 0528  HGBA1C 6.3*   CBG:  Recent Labs Lab 05/02/17 0816 05/02/17 1207 05/02/17 1705 05/02/17 2312 05/03/17 1222  GLUCAP 96 155* 170* 142* 106*   Lipid Profile: No results for input(s): CHOL, HDL, LDLCALC, TRIG, CHOLHDL, LDLDIRECT in the last 72 hours. Thyroid Function Tests:  Recent Labs  05/01/17 1745  TSH 1.951   Anemia Panel: No results for input(s): VITAMINB12, FOLATE, FERRITIN, TIBC, IRON, RETICCTPCT in the last 72 hours. Sepsis Labs:  Recent Labs Lab 05/01/17 1745 05/01/17 1812 05/03/17 0603  PROCALCITON 0.45  --  0.35  LATICACIDVEN  --  1.29  --     Recent Results (from the past 240 hour(s))  Blood culture (routine x 2)     Status: None (Preliminary result)   Collection Time: 05/01/17  5:45 PM  Result Value Ref Range Status   Specimen Description BLOOD RIGHT ANTECUBITAL  Final   Special Requests   Final    BOTTLES DRAWN AEROBIC AND ANAEROBIC Blood Culture adequate volume    Culture NO GROWTH < 24 HOURS  Final   Report Status PENDING  Incomplete  Blood culture (routine x 2)     Status: None (Preliminary result)   Collection Time: 05/01/17  6:00 PM  Result Value Ref Range Status   Specimen Description BLOOD RIGHT HAND  Final   Special Requests   Final    BOTTLES DRAWN AEROBIC ONLY Blood Culture adequate volume   Culture NO GROWTH < 24 HOURS  Final   Report Status PENDING  Incomplete       Radiology Studies: Dg Chest 2 View  Result Date: 05/01/2017 CLINICAL DATA:  Shortness of breath and nonproductive cough over the last 3-4 days. EXAM: CHEST  2 VIEW COMPARISON:  04/22/2017 FINDINGS: Previous median sternotomy. Heart size is normal. There is chronic scarring in the right lower lung. There are progressive patchy densities in the right upper lobe and in the left mid lung. In the right upper lobe, there could be some central low density suggesting possible cavitation. No evidence of edema. Pleural scarring on the right. IMPRESSION: Worsened infiltrates in the right upper lung and left mid lung consistent with pneumonia. Question developing central lucency in the right upper lobe region suggesting possible cavitation. Electronically Signed   By: Nelson Chimes M.D.   On: 05/01/2017 17:00   Ct Chest Wo Contrast  Result Date: 05/01/2017 CLINICAL DATA:  Lung lesions EXAM: CT CHEST WITHOUT CONTRAST TECHNIQUE: Multidetector CT imaging of the chest was performed following the standard protocol without IV contrast. COMPARISON:  Radiographs 05/01/2017, CT thorax 10/05/2014 FINDINGS: Cardiovascular: Limited evaluation without intravenous contrast. Aortic atherosclerosis. Post CABG changes. No significant pericardial effusion. Vascular stent in the left axilla. Mediastinum/Nodes: Mild mediastinal adenopathy,  for example right paratracheal lymph node measures 12 mm short axis. Small calcified left hilar lymph nodes. Midline trachea. No thyroid mass. Mild air distention of the  esophagus. Lungs/Pleura: Small right pleural effusion or pleural thickening. Mildly loculated pleural effusion along the right pulmonary fissures. Ground-glass density and consolidation in the right upper lobe with cavitation, this measures approximately 4.4 by 3 cm. Multiple mostly peripheral bilateral lung nodules, for example left upper lobe lateral subpleural nodule measures 7 mm, series 6, image number 50. Anterior subpleural left upper lobe nodule measuring 14 mm, series 6, image number 75. Left subpleural, posterior 16 mm nodule with mild cavitation. Multiple additional pulmonary nodules. Upper Abdomen: Atrophic kidneys.  Splenic granuloma. Musculoskeletal: Post sternotomy changes. No acute or suspicious bone lesion. IMPRESSION: 1. Approximate 4.4 cm cavitary lesion in the right upper lobe with surrounding ground-glass density, differential considerations include cavitary infection and cavitary neoplasm. There are multiple additional bilateral pulmonary nodules, some with cavitation. Differential considerations include cavitary infection, septic emboli, and metastatic disease. 2. Small right-sided pleural effusion with loculated fluid along the right pulmonary fissures. 3. Mediastinal adenopathy could be reactive 4. Atrophic kidneys Aortic Atherosclerosis (ICD10-I70.0). Electronically Signed   By: Donavan Foil M.D.   On: 05/01/2017 19:39      Scheduled Meds: . acetaminophen      . aspirin EC  81 mg Oral QHS  . calcium acetate  667 mg Oral TID WC  . Chlorhexidine Gluconate Cloth  6 each Topical Q0600  . cycloSPORINE modified  75 mg Oral Daily   And  . cycloSPORINE modified  50 mg Oral q1800  . darbepoetin (ARANESP) injection - DIALYSIS  40 mcg Intravenous Q Thu-HD  . donepezil  10 mg Oral QHS  . FLUoxetine  20 mg Oral Daily  . heparin  5,000 Units Subcutaneous Q8H  . hydrALAZINE  25 mg Oral TID  . insulin aspart  0-9 Units Subcutaneous TID WC  . [START ON 05/04/2017] levothyroxine  75 mcg  Oral QAC breakfast  . metoprolol tartrate  25 mg Oral BID  . mupirocin ointment   Nasal BID  . mycophenolate  250 mg Oral Once per day on Sun Mon Wed Fri   And  . mycophenolate  500 mg Oral Once per day on Tue Thu Sat  . mycophenolate  500 mg Oral Daily  . pantoprazole  20 mg Oral Daily  . pravastatin  10 mg Oral QHS  . vitamin B-12  1,000 mcg Oral Q breakfast   Continuous Infusions: . [START ON 05/04/2017] cefUROXime (ZINACEF)  IV    . vancomycin       LOS: 2 days    Time spent: 30 minutes   Dessa Phi, DO [facility] [email or web address] Password [facility] 05/03/2017, 12:49 PM

## 2017-05-03 NOTE — Progress Notes (Signed)
[facility] Progress Note   Subjective:  "I'm feeling better today". No new C/Os.   Objective Vitals:   05/03/17 0624 05/03/17 0659 05/03/17 0700 05/03/17 0730  BP: 102/81 (!) 123/39 (!) 118/40 (!) 132/33  Pulse: 70 70 70 69  Resp: 18 12 18 19   Temp: 98.1 F (36.7 C) 98 F (36.7 C)    TempSrc: Oral Oral    SpO2: 99% 99%    Weight:  66 kg (145 lb 8.1 oz)    Height:       Physical Exam General: Chronically ill appearing male in NAD. Very HOH without hearing aides Heart:S1,S2, RRR  Lungs: CTAB A/P still decreased in bases, No WOB/Not coughing at present Abdomen: soft, active BS Extremities: No LE edema Dialysis Access: LUA AVF cannulated at present   Additional Objective Labs: Basic Metabolic Panel:  Recent Labs Lab 05/01/17 1642  05/02/17 0528 05/03/17 0603 05/03/17 0713  NA 135  --  134* 136 136  K 3.7  --  3.6 4.0 4.0  CL 94*  --  95* 96* 96*  CO2 29  --  26 27 26   GLUCOSE 165*  --  104* 103* 122*  BUN 34*  --  41* 49* 49*  CREATININE 4.99*  < > 6.32* 7.52* 7.36*  CALCIUM 8.2*  --  7.7* 8.0* 7.9*  PHOS 3.1  --   --   --  5.2*  < > = values in this interval not displayed. Liver Function Tests:  Recent Labs Lab 05/01/17 1642 05/02/17 0528 05/03/17 0713  AST  --  15  --   ALT  --  18  --   ALKPHOS  --  92  --   BILITOT  --  0.7  --   PROT  --  5.4*  --   ALBUMIN 2.3* 2.2* 2.1*   No results for input(s): LIPASE, AMYLASE in the last 168 hours. CBC:  Recent Labs Lab 05/01/17 1642 05/01/17 1745 05/02/17 0528 05/03/17 0603  WBC 9.7 10.9* 8.8 7.7  NEUTROABS  --   --   --  4.7  HGB 10.0* 8.6* 9.4* 8.8*  HCT 31.6* 27.2* 30.0* 27.9*  MCV 95.2 95.8 95.8 96.2  PLT 347 389 349 362   Blood Culture    Component Value Date/Time   SDES BLOOD RIGHT HAND 05/01/2017 1800   SPECREQUEST  05/01/2017 1800    BOTTLES DRAWN AEROBIC ONLY Blood Culture adequate volume   CULT NO GROWTH < 24 HOURS 05/01/2017 1800   REPTSTATUS PENDING 05/01/2017  1800    Cardiac Enzymes: No results for input(s): CKTOTAL, CKMB, CKMBINDEX, TROPONINI in the last 168 hours. CBG:  Recent Labs Lab 04/26/17 1554 05/02/17 0816 05/02/17 1207 05/02/17 1705 05/02/17 2312  GLUCAP 252* 96 155* 170* 142*   Iron Studies: No results for input(s): IRON, TIBC, TRANSFERRIN, FERRITIN in the last 72 hours. @lablastinr3 @ Studies/Results: Dg Chest 2 View  Result Date: 05/01/2017 CLINICAL DATA:  Shortness of breath and nonproductive cough over the last 3-4 days. EXAM: CHEST  2 VIEW COMPARISON:  04/22/2017 FINDINGS: Previous median sternotomy. Heart size is normal. There is chronic scarring in the right lower lung. There are progressive patchy densities in the right upper lobe and in the left mid lung. In the right upper lobe, there could be some central low density suggesting possible cavitation. No evidence of edema. Pleural scarring on the right. IMPRESSION: Worsened infiltrates in the right upper lung and left mid lung consistent with pneumonia. Question developing  central lucency in the right upper lobe region suggesting possible cavitation. Electronically Signed   By: Nelson Chimes M.D.   On: 05/01/2017 17:00   Ct Chest Wo Contrast  Result Date: 05/01/2017 CLINICAL DATA:  Lung lesions EXAM: CT CHEST WITHOUT CONTRAST TECHNIQUE: Multidetector CT imaging of the chest was performed following the standard protocol without IV contrast. COMPARISON:  Radiographs 05/01/2017, CT thorax 10/05/2014 FINDINGS: Cardiovascular: Limited evaluation without intravenous contrast. Aortic atherosclerosis. Post CABG changes. No significant pericardial effusion. Vascular stent in the left axilla. Mediastinum/Nodes: Mild mediastinal adenopathy, for example right paratracheal lymph node measures 12 mm short axis. Small calcified left hilar lymph nodes. Midline trachea. No thyroid mass. Mild air distention of the esophagus. Lungs/Pleura: Small right pleural effusion or pleural thickening. Mildly  loculated pleural effusion along the right pulmonary fissures. Ground-glass density and consolidation in the right upper lobe with cavitation, this measures approximately 4.4 by 3 cm. Multiple mostly peripheral bilateral lung nodules, for example left upper lobe lateral subpleural nodule measures 7 mm, series 6, image number 50. Anterior subpleural left upper lobe nodule measuring 14 mm, series 6, image number 75. Left subpleural, posterior 16 mm nodule with mild cavitation. Multiple additional pulmonary nodules. Upper Abdomen: Atrophic kidneys.  Splenic granuloma. Musculoskeletal: Post sternotomy changes. No acute or suspicious bone lesion. IMPRESSION: 1. Approximate 4.4 cm cavitary lesion in the right upper lobe with surrounding ground-glass density, differential considerations include cavitary infection and cavitary neoplasm. There are multiple additional bilateral pulmonary nodules, some with cavitation. Differential considerations include cavitary infection, septic emboli, and metastatic disease. 2. Small right-sided pleural effusion with loculated fluid along the right pulmonary fissures. 3. Mediastinal adenopathy could be reactive 4. Atrophic kidneys Aortic Atherosclerosis (ICD10-I70.0). Electronically Signed   By: Donavan Foil M.D.   On: 05/01/2017 19:39   Medications: . sodium chloride    . sodium chloride    . [START ON 05/04/2017] cefUROXime (ZINACEF)  IV    . vancomycin     . aspirin EC  81 mg Oral QHS  . calcium acetate  667 mg Oral TID WC  . Chlorhexidine Gluconate Cloth  6 each Topical Q0600  . cycloSPORINE modified  75 mg Oral Daily   And  . cycloSPORINE modified  50 mg Oral q1800  . Darbepoetin Alfa      . darbepoetin (ARANESP) injection - DIALYSIS  40 mcg Intravenous Q Thu-HD  . donepezil  10 mg Oral QHS  . FLUoxetine  20 mg Oral Daily  . heparin  5,000 Units Subcutaneous Q8H  . hydrALAZINE  25 mg Oral TID  . insulin aspart  0-9 Units Subcutaneous TID WC  . levothyroxine  75  mcg Oral Daily  . metoprolol tartrate  25 mg Oral BID  . mupirocin ointment   Nasal BID  . mycophenolate  250 mg Oral Once per day on Sun Mon Wed Fri   And  . mycophenolate  500 mg Oral Once per day on Tue Thu Sat  . mycophenolate  500 mg Oral Daily  . pantoprazole  20 mg Oral Daily  . pravastatin  10 mg Oral QHS  . vancomycin      . vitamin B-12  1,000 mcg Oral Q breakfast   Dialysis Orders: [place] T, Th,S 4 hrs 2.0 K/2.25 Ca 61 kg  400/Auto 1.5 -No Heparin  -Venofer 50mg  IV weekly (Last dose 04/12/17 Fe 106 Tsat 45% 04/12/17)    Assessment/Plan: 1. Bilateral Infiltrates with multiple cavitary pulmonary lesions in immunosuppressed pt: Per primary. BC  NG 24 hours.  ID consulted. Possible MRSA Bacteremia/Possible endocarditis/possible fungal infection. ABX narrowed to vanc. Pulmonary and cardiology consulted. 2. ESRD -T, Th, S. On HD per schedule. K+ 4.0 change to 2.0 K bath.  3. Anemia - HGB 8.8. Rec'd  Aranesp 40 mcg IV weekly today. No venofer until [facility] results final  4. Secondary hyperparathyroidism - Ca 7.9 Phos 5.2. cont binders, no VDRA.  5. HTN/volume - EDW lowered to 61 kg post hospitalization-has not been able to reach in HD center however wt here 61.2 kg bed wt. However pre wt 66 kg had planned to run even but will increase UFG to 1 liter. Continue metoprolol and hydralazine. Also takes amlodipine 5 mg PO Q hs. Better control of BP. BP's good on 2 BP meds, wt's prob off.  UF 1kg today as tolerated.  6. Nutrition - Albumin 2.1 Renal/Carb mod diet, prostat and renal vit 7. DM: per primary  8. Ulcerated AVF: Seen by [facility]. For revision of AVF per Dr. Oneida Alar 05/04/17 9. H/O Cardiac transplant: Cont immunosuppressant therapy   [name] H. Brown NP-C 05/03/2017, 9:12 AM  [facility] [phone]  Pt seen, examined, agree w assess/plan as above with additions as indicated.  [name] MD [employer] pager [phone]    cell [phone] 05/03/2017,  11:24 AM

## 2017-05-03 NOTE — Progress Notes (Signed)
Pt seen on dialysis today.  He states that he is "doing pretty good".  He is not having any shortness of breath, nausea, CP.  Will proceed with revision of left arm fistula tomorrow.  NPO after MN/consent.   Leontine Locket, [facility] 05/03/2017 12:28 PM

## 2017-05-03 NOTE — Procedures (Signed)
  I was present at this dialysis session, have reviewed the session itself and made  appropriate changes [name] MD [facility] pager [phone]   05/03/2017, 11:25 AM

## 2017-05-03 NOTE — Progress Notes (Signed)
    [facility] has been requested to perform a transesophageal echocardiogram on [name] [name] for bacteremia on 05/07/17 at 12.  After careful review of history and examination, the risks and benefits of transesophageal echocardiogram have been explained including risks of esophageal damage, perforation (1:10,000 risk), bleeding, pharyngeal hematoma as well as other potential complications associated with conscious sedation including aspiration, arrhythmia, respiratory failure and death. Alternatives to treatment were discussed, questions were answered. Patient is willing to proceed. NPO after midnight Sunday night 05/06/17,  Angelena Form, PA-C  05/03/2017 11:34 AM

## 2017-05-03 NOTE — Progress Notes (Signed)
    [facility] for Infectious Disease   Reason for visit: Follow up on bacteremia, cavitary lesions  Interval History: blood cultures remain negative, overall feels better, afebrile, wbc wnl.    Physical Exam: Constitutional:  Vitals:   05/03/17 1030 05/03/17 1059  BP: (!) 136/117 104/78  Pulse: 77 78  Resp:  18  Temp:  98 F (36.7 C)   patient appears in NAD, in dialysis Eyes: anicteric Respiratory: Normal respiratory effort; CTA B Cardiovascular: RRR GI: soft, nt, nd  Review of Systems: Constitutional: negative for fevers and chills Gastrointestinal: negative for diarrhea  Lab Results  Component Value Date   WBC 7.7 05/03/2017   HGB 8.8 (L) 05/03/2017   HCT 27.9 (L) 05/03/2017   MCV 96.2 05/03/2017   PLT 362 05/03/2017    Lab Results  Component Value Date   CREATININE 7.36 (H) 05/03/2017   BUN 49 (H) 05/03/2017   NA 136 05/03/2017   K 4.0 05/03/2017   CL 96 (L) 05/03/2017   CO2 26 05/03/2017    Lab Results  Component Value Date   ALT 18 05/02/2017   AST 15 05/02/2017   ALKPHOS 92 05/02/2017     Microbiology: Recent Results (from the past 240 hour(s))  Blood culture (routine x 2)     Status: None (Preliminary result)   Collection Time: 05/01/17  5:45 PM  Result Value Ref Range Status   Specimen Description BLOOD RIGHT ANTECUBITAL  Final   Special Requests   Final    BOTTLES DRAWN AEROBIC AND ANAEROBIC Blood Culture adequate volume   Culture NO GROWTH < 24 HOURS  Final   Report Status PENDING  Incomplete  Blood culture (routine x 2)     Status: None (Preliminary result)   Collection Time: 05/01/17  6:00 PM  Result Value Ref Range Status   Specimen Description BLOOD RIGHT HAND  Final   Special Requests   Final    BOTTLES DRAWN AEROBIC ONLY Blood Culture adequate volume   Culture NO GROWTH < 24 HOURS  Final   Report Status PENDING  Incomplete    Impression/Plan:  1. MRSA bacteremia - on vancomycin with dialysis.  TEE for Monday to see if  vegetation on valve.  Blood cultures here remain negative.  2.  Cavitary lesions - for BAL tomorrow for broad differential.  Will monitor those cultures.   3.  Heart transplant - on immunosuppressive medications.    4.  ESRD - fistula evaluated and to get a revision tomorrow

## 2017-05-04 ENCOUNTER — Encounter (HOSPITAL_COMMUNITY)
Admission: EM | Disposition: A | Discharge status: Skilled Nursing Facility | Payer: Self-pay | Source: Home / Self Care | Attending: Internal Medicine

## 2017-05-04 ENCOUNTER — Encounter (HOSPITAL_COMMUNITY): Payer: Medicare HMO

## 2017-05-04 ENCOUNTER — Inpatient Hospital Stay (HOSPITAL_COMMUNITY): Payer: Medicare HMO | Admitting: Anesthesiology

## 2017-05-04 ENCOUNTER — Encounter (HOSPITAL_COMMUNITY): Payer: Self-pay | Admitting: Surgery

## 2017-05-04 DIAGNOSIS — Z992 Dependence on renal dialysis: Secondary | ICD-10-CM

## 2017-05-04 DIAGNOSIS — T82898A Other specified complication of vascular prosthetic devices, implants and grafts, initial encounter: Secondary | ICD-10-CM

## 2017-05-04 DIAGNOSIS — N186 End stage renal disease: Secondary | ICD-10-CM

## 2017-05-04 HISTORY — PX: VIDEO BRONCHOSCOPY: SHX5072

## 2017-05-04 HISTORY — PX: REVISON OF ARTERIOVENOUS FISTULA: SHX6074

## 2017-05-04 LAB — GLUCOSE, CAPILLARY
GLUCOSE-CAPILLARY: 85 mg/dL (ref 65–99)
GLUCOSE-CAPILLARY: 99 mg/dL (ref 65–99)
Glucose-Capillary: 88 mg/dL (ref 65–99)
Glucose-Capillary: 114 mg/dL — ABNORMAL HIGH (ref 65–99)

## 2017-05-04 LAB — CBC WITH DIFFERENTIAL/PLATELET
BASOS ABS: 0.1 10*3/uL (ref 0.0–0.1)
BASOS PCT: 1 %
Eosinophils Absolute: 0.1 10*3/uL (ref 0.0–0.7)
Eosinophils Relative: 1 %
HEMATOCRIT: 29.3 % — AB (ref 39.0–52.0)
HEMOGLOBIN: 9 g/dL — AB (ref 13.0–17.0)
Lymphocytes Relative: 35 %
Lymphs Abs: 2.5 10*3/uL (ref 0.7–4.0)
MCH: 30.5 pg (ref 26.0–34.0)
MCHC: 30.7 g/dL (ref 30.0–36.0)
MCV: 99.3 fL (ref 78.0–100.0)
Monocytes Absolute: 0.7 10*3/uL (ref 0.1–1.0)
Monocytes Relative: 9 %
NEUTROS ABS: 3.8 10*3/uL (ref 1.7–7.7)
Neutrophils Relative %: 54 %
Platelets: 403 10*3/uL — ABNORMAL HIGH (ref 150–400)
RBC: 2.95 MIL/uL — AB (ref 4.22–5.81)
RDW: 15.2 % (ref 11.5–15.5)
WBC: 7.2 10*3/uL (ref 4.0–10.5)

## 2017-05-04 LAB — BODY FLUID CELL COUNT WITH DIFFERENTIAL
LYMPHS FL: 10 %
LYMPHS FL: 8 %
Monocyte-Macrophage-Serous Fluid: 74 % (ref 50–90)
Monocyte-Macrophage-Serous Fluid: 86 % (ref 50–90)
Neutrophil Count, Fluid: 16 % (ref 0–25)
Neutrophil Count, Fluid: 6 % (ref 0–25)
Total Nucleated Cell Count, Fluid: 105 cu mm (ref 0–1000)
Total Nucleated Cell Count, Fluid: 84 cu mm (ref 0–1000)

## 2017-05-04 LAB — BASIC METABOLIC PANEL
ANION GAP: 12 (ref 5–15)
BUN: 17 mg/dL (ref 6–20)
BUN: 17 (ref 4–21)
CALCIUM: 8.1 mg/dL — AB (ref 8.9–10.3)
CHLORIDE: 95 mmol/L — AB (ref 101–111)
CO2: 29 mmol/L (ref 22–32)
Creatinine, Ser: 3.88 mg/dL — ABNORMAL HIGH (ref 0.61–1.24)
Creatinine: 3.9 — AB (ref 0.6–1.3)
GFR calc non Af Amer: 14 mL/min — ABNORMAL LOW (ref >60)
GFR, EST AFRICAN AMERICAN: 16 mL/min — AB (ref >60)
GLUCOSE: 86
Glucose, Bld: 86 mg/dL (ref 65–99)
POTASSIUM: 3.9 (ref 3.4–5.3)
Potassium: 3.9 mmol/L (ref 3.5–5.1)
SODIUM: 136 — AB (ref 137–147)
Sodium: 136 mmol/L (ref 135–145)

## 2017-05-04 LAB — CBC AND DIFFERENTIAL
HCT: 29 — AB (ref 41–53)
Hemoglobin: 9 — AB (ref 13.5–17.5)
Platelets: 403 — AB (ref 150–399)
WBC: 7.2

## 2017-05-04 LAB — PROTIME-INR
INR: 1.15
Prothrombin Time: 14.7 seconds (ref 11.4–15.2)

## 2017-05-04 SURGERY — REVISON OF ARTERIOVENOUS FISTULA
Anesthesia: Monitor Anesthesia Care | Laterality: Left

## 2017-05-04 SURGERY — VIDEO BRONCHOSCOPY WITHOUT FLUORO
Anesthesia: Moderate Sedation | Laterality: Bilateral

## 2017-05-04 MED ORDER — FENTANYL CITRATE (PF) 100 MCG/2ML IJ SOLN
INTRAMUSCULAR | Status: DC | PRN
  Administered 2017-05-04 (×2): 50 ug via INTRAVENOUS

## 2017-05-04 MED ORDER — OXYCODONE HCL 5 MG PO TABS: 5.0000 mg | ORAL_TABLET | Freq: Once | ORAL | Status: DC | PRN

## 2017-05-04 MED ORDER — SODIUM CHLORIDE 0.9 % IR SOLN
Status: DC | PRN
  Administered 2017-05-04 (×2): 1000 mL

## 2017-05-04 MED ORDER — OXYCODONE HCL 5 MG/5ML PO SOLN: 5.0000 mg | Freq: Once | ORAL | Status: DC | PRN

## 2017-05-04 MED ORDER — LIDOCAINE HCL (CARDIAC) 20 MG/ML IV SOLN
INTRAVENOUS | Status: DC | PRN
  Administered 2017-05-04: 60 mg via INTRAVENOUS

## 2017-05-04 MED ORDER — FENTANYL CITRATE (PF) 100 MCG/2ML IJ SOLN: 25.0000 ug | INTRAMUSCULAR | Status: DC | PRN

## 2017-05-04 MED ORDER — EPHEDRINE SULFATE 50 MG/ML IJ SOLN
INTRAMUSCULAR | Status: DC | PRN
  Administered 2017-05-04: 5 mg via INTRAVENOUS
  Administered 2017-05-04: 10 mg via INTRAVENOUS

## 2017-05-04 MED ORDER — SUCCINYLCHOLINE CHLORIDE 20 MG/ML IJ SOLN
INTRAMUSCULAR | Status: DC | PRN
  Administered 2017-05-04: 120 mg via INTRAVENOUS

## 2017-05-04 MED ORDER — ONDANSETRON HCL 4 MG/2ML IJ SOLN: 4.0000 mg | Freq: Once | INTRAMUSCULAR | Status: DC | PRN

## 2017-05-04 MED ORDER — HEPARIN SODIUM (PORCINE) 5000 UNIT/ML IJ SOLN
INTRAMUSCULAR | Status: DC | PRN
  Administered 2017-05-04: 500 mL

## 2017-05-04 MED ORDER — LIDOCAINE-EPINEPHRINE (PF) 1 %-1:200000 IJ SOLN
INTRAMUSCULAR | Status: AC
  Filled 2017-05-04: qty 30

## 2017-05-04 MED ORDER — PROPOFOL 10 MG/ML IV BOLUS
INTRAVENOUS | Status: AC
  Filled 2017-05-04: qty 20

## 2017-05-04 MED ORDER — OXYCODONE HCL 5 MG PO TABS: 5.0000 mg | ORAL_TABLET | Freq: Four times a day (QID) | ORAL | Status: DC | PRN

## 2017-05-04 MED ORDER — FENTANYL CITRATE (PF) 250 MCG/5ML IJ SOLN
INTRAMUSCULAR | Status: AC
  Filled 2017-05-04: qty 5

## 2017-05-04 MED ORDER — HEPARIN SODIUM (PORCINE) 5000 UNIT/ML IJ SOLN
5000.0000 [IU] | Freq: Three times a day (TID) | INTRAMUSCULAR | Status: DC
  Administered 2017-05-05 – 2017-05-08 (×9): 5000 [IU] via SUBCUTANEOUS
  Filled 2017-05-04 (×8): qty 1

## 2017-05-04 MED ORDER — PHENYLEPHRINE HCL 10 MG/ML IJ SOLN
INTRAMUSCULAR | Status: DC | PRN
  Administered 2017-05-04: 80 ug via INTRAVENOUS
  Administered 2017-05-04: 40 ug via INTRAVENOUS

## 2017-05-04 MED ORDER — LACTATED RINGERS IV SOLN
INTRAVENOUS | Status: DC | PRN
  Administered 2017-05-04: 15:00:00 via INTRAVENOUS

## 2017-05-04 MED ORDER — PROPOFOL 10 MG/ML IV BOLUS
INTRAVENOUS | Status: DC | PRN
  Administered 2017-05-04: 80 mg via INTRAVENOUS

## 2017-05-04 MED ORDER — PHENYLEPHRINE HCL 10 MG/ML IJ SOLN
INTRAVENOUS | Status: DC | PRN
  Administered 2017-05-04: 30 ug/min via INTRAVENOUS

## 2017-05-04 SURGICAL SUPPLY — 35 items
ARMBAND PINK RESTRICT EXTREMIT (MISCELLANEOUS) ×4 IMPLANT
BNDG ESMARK 4X9 LF (GAUZE/BANDAGES/DRESSINGS) ×4 IMPLANT
CANISTER SUCT 3000ML PPV (MISCELLANEOUS) ×4 IMPLANT
CANNULA VESSEL 3MM 2 BLNT TIP (CANNULA) ×4 IMPLANT
CLIP LIGATING EXTRA MED SLVR (CLIP) ×4 IMPLANT
CLIP LIGATING EXTRA SM BLUE (MISCELLANEOUS) ×4 IMPLANT
COVER BACK TABLE 60X90IN (DRAPES) ×4 IMPLANT
COVER PROBE W GEL 5X96 (DRAPES) ×4 IMPLANT
DECANTER SPIKE VIAL GLASS SM (MISCELLANEOUS) ×4 IMPLANT
DERMABOND ADVANCED (GAUZE/BANDAGES/DRESSINGS) ×2
DERMABOND ADVANCED .7 DNX12 (GAUZE/BANDAGES/DRESSINGS) ×2 IMPLANT
DRSG TEGADERM 2-3/8X2-3/4 SM (GAUZE/BANDAGES/DRESSINGS) ×4 IMPLANT
ELECT REM PT RETURN 9FT ADLT (ELECTROSURGICAL) ×4
ELECTRODE REM PT RTRN 9FT ADLT (ELECTROSURGICAL) ×2 IMPLANT
GAUZE SPONGE 2X2 8PLY NS (GAUZE/BANDAGES/DRESSINGS) ×4 IMPLANT
GAUZE SPONGE 4X4 12PLY STRL (GAUZE/BANDAGES/DRESSINGS) ×4 IMPLANT
GLOVE SS BIOGEL STRL SZ 7.5 (GLOVE) ×2 IMPLANT
GLOVE SUPERSENSE BIOGEL SZ 7.5 (GLOVE) ×2
GOWN STRL REUS W/ TWL LRG LVL3 (GOWN DISPOSABLE) ×6 IMPLANT
GOWN STRL REUS W/TWL LRG LVL3 (GOWN DISPOSABLE) ×6
KIT BASIN OR (CUSTOM PROCEDURE TRAY) ×4 IMPLANT
KIT ROOM TURNOVER OR (KITS) ×4 IMPLANT
MARKER SKIN DUAL TIP RULER LAB (MISCELLANEOUS) ×4 IMPLANT
NS IRRIG 1000ML POUR BTL (IV SOLUTION) ×4 IMPLANT
OIL SILICONE PENTAX (PARTS (SERVICE/REPAIRS)) ×4 IMPLANT
PACK CV ACCESS (CUSTOM PROCEDURE TRAY) ×4 IMPLANT
PAD ARMBOARD 7.5X6 YLW CONV (MISCELLANEOUS) ×8 IMPLANT
SUT PROLENE 5 0 C 1 24 (SUTURE) ×12 IMPLANT
SUT PROLENE 6 0 CC (SUTURE) ×4 IMPLANT
SUT VIC AB 3-0 SH 27 (SUTURE) ×2
SUT VIC AB 3-0 SH 27X BRD (SUTURE) ×2 IMPLANT
SYR 50ML SLIP (SYRINGE) ×16 IMPLANT
TRAP SPECIMEN MUCOUS 40CC (MISCELLANEOUS) ×8 IMPLANT
UNDERPAD 30X30 (UNDERPADS AND DIAPERS) ×4 IMPLANT
WATER STERILE IRR 1000ML POUR (IV SOLUTION) ×4 IMPLANT

## 2017-05-04 NOTE — Progress Notes (Signed)
PROGRESS NOTE    [name] [name]  RCB:[id number] DOB: [birth year] DOA: 05/01/2017 PCP: Junie Panning, NP     Brief Narrative:  [name] [name] is a 76 year old male with history of high counts 1, ESRD, coronary disease, type 2 diabetes, hypertension, dyslipidemia, recently admitted for presumed streptococcal bacteremia for which he was treated initially with vancomycin and Zosyn and subsequently transitioned to oral Augmentin and discharged to rehabilitation on 6/7, who presents to the ER today via EMS from his dialysis center with complaints of shortness of breath, nonproductive cough 3-4 days. Patient denies any other symptoms of fever, chills, rigors, sore throat. Chest x-ray as well as CT chest were completed which showed multiple pulmonary cavitary lesions. Patient was started on empiric antibiotics. ID, Pulm have been consulted.   Assessment & Plan:   Active Problems:   Hypercholesterolemia   Heart transplant status ([place])   DM (diabetes mellitus), type 2 with renal complications (HCC)   Hypothyroidism   ESRD on dialysis ([place])   Protein-calorie malnutrition, severe (HCC)   Anemia of chronic disease   Depression   Generalized weakness   HCAP (healthcare-associated pneumonia)   Pressure injury of skin   Cavitary lesion of lung   Bacteremia   Multiple cavitary pulmonary lesions in immunosuppressed  -Recently admitted for presumed strep bacteremia on blood culture from urgent care in [place], culture at [facility] was negative and discharged with Augmentin. TTE 6/6 without vegetation. Initially thought strep but actually it was MRSA (see scanned report from 04/27/2017 Correspondence with [facility])  -Blood cultures NGTD  -ID following -TEE scheduled 6/18  -Vanco  -Vascular surgery planning left arm fistula revision due to 2 small ulcerations over fistula 6/15  -Pulm following, bronchoscopy scheduled 6/15  ESRD TThS -Nephrology for dialysis  History of heart  transplant 2001, follows at [facility] cyclosporine, CellCept  Type 2 diabetes, well controlled -Sliding-scale insulin -Ha1c 6.3   Hypertension -Continue metoprolol, hydralazine   Dyslipidemia -Continue pravastatin  Hypothyroidism -Continue levothyroxine  Barrett's esophagus -Continue PPI  Depression, dementia -Continue fluoxetine, Aricept    DVT prophylaxis: heparin subq Code Status: full Family Communication: updated wife over phone 6/14  Disposition Plan: pending improvement   Consultants:   ID  Pulm  Nephro   Procedures:   None  Antimicrobials:  Anti-infectives    Start     Dose/Rate Route Frequency Ordered Stop   05/04/17 1400  [MAR Hold]  cefUROXime (ZINACEF) 1.5 g in dextrose 5 % 50 mL IVPB     (MAR Hold since 05/04/17 1135)   1.5 g 100 mL/hr over 30 Minutes Intravenous To Short Stay 05/03/17 0802 05/05/17 1400   05/03/17 1200  [MAR Hold]  vancomycin (VANCOCIN) 500 mg in sodium chloride 0.9 % 100 mL IVPB     (MAR Hold since 05/04/17 1135)   500 mg 100 mL/hr over 60 Minutes Intravenous Every T-Th-Sa (Hemodialysis) 05/02/17 1619     05/03/17 0855  vancomycin (VANCOCIN) 500-5 MG/100ML-% IVPB    Comments:  Cherylann Banas   : cabinet override      05/03/17 0855 05/03/17 1006   05/02/17 1800  ceFEPIme (MAXIPIME) 1 g in dextrose 5 % 50 mL IVPB  Status:  Discontinued     1 g 100 mL/hr over 30 Minutes Intravenous Every 24 hours 05/01/17 1743 05/02/17 1150   05/02/17 1300  piperacillin-tazobactam (ZOSYN) IVPB 3.375 g  Status:  Discontinued     3.375 g 12.5 mL/hr over 240 Minutes Intravenous Every 12 hours 05/02/17  1206 05/02/17 1325   05/01/17 1800  vancomycin (VANCOCIN) 1,250 mg in sodium chloride 0.9 % 250 mL IVPB     1,250 mg 166.7 mL/hr over 90 Minutes Intravenous  Once 05/01/17 1727 05/01/17 2101   05/01/17 1715  ceFEPIme (MAXIPIME) 1 g in dextrose 5 % 50 mL IVPB     1 g 100 mL/hr over 30 Minutes Intravenous  Once 05/01/17 1713 05/01/17  1925         Subjective: Patient without any complaints today. States he feels better. No fevers, chills, chest pain, sob, cough. No nausea, vomiting, abdominal pain. Reviewed pending procedures, patient in agreement and no questions or concerns today.    Objective: Vitals:   05/03/17 1714 05/03/17 2107 05/04/17 0613 05/04/17 1057  BP: (!) 148/55 (!) 141/37 (!) 76/49 (!) 128/58  Pulse: 66 70 66 65  Resp:  17 17 16   Temp: 98.5 F (36.9 C) 98.3 F (36.8 C) 97.8 F (36.6 C) 97.6 F (36.4 C)  TempSrc: Oral Oral Oral Oral  SpO2: 99% 100% 100% 100%  Weight:  67 kg (147 lb 11.3 oz)    Height:        Intake/Output Summary (Last 24 hours) at 05/04/17 1307 Last data filed at 05/04/17 0900  Gross per 24 hour  Intake                0 ml  Output                0 ml  Net                0 ml   Filed Weights   05/03/17 0659 05/03/17 1059 05/03/17 2107  Weight: 66 kg (145 lb 8.1 oz) 65 kg (143 lb 4.8 oz) 67 kg (147 lb 11.3 oz)    Examination:  General exam: Appears calm and comfortable  Respiratory system: diminished. Respiratory effort normal, although poor inspiratory effort limiting examination Cardiovascular system: S1 & S2 heard, RRR. No JVD, murmurs, rubs, gallops or clicks. No pedal edema. Gastrointestinal system: Abdomen is nondistended, soft and nontender. No organomegaly or masses felt. Normal bowel sounds heard. Central nervous system: Alert and oriented. No focal neurological deficits.  Extremities: Symmetric 5 x 5 power. LUE with palpable thrill  Psychiatry: Judgement and insight appear normal. Mood & affect appropriate.   Data Reviewed: I have personally reviewed following labs and imaging studies  CBC:  Recent Labs Lab 05/01/17 1642 05/01/17 1745 05/02/17 0528 05/03/17 0603 05/04/17 0435  WBC 9.7 10.9* 8.8 7.7 7.2  NEUTROABS  --   --   --  4.7 3.8  HGB 10.0* 8.6* 9.4* 8.8* 9.0*  HCT 31.6* 27.2* 30.0* 27.9* 29.3*  MCV 95.2 95.8 95.8 96.2 99.3  PLT 347 389  349 362 268*   Basic Metabolic Panel:  Recent Labs Lab 05/01/17 1642 05/01/17 1745 05/02/17 0528 05/03/17 0603 05/03/17 0713 05/04/17 0435  NA 135  --  134* 136 136 136  K 3.7  --  3.6 4.0 4.0 3.9  CL 94*  --  95* 96* 96* 95*  CO2 29  --  26 27 26 29   GLUCOSE 165*  --  104* 103* 122* 86  BUN 34*  --  41* 49* 49* 17  CREATININE 4.99* 5.43* 6.32* 7.52* 7.36* 3.88*  CALCIUM 8.2*  --  7.7* 8.0* 7.9* 8.1*  MG 2.0  --   --   --   --   --   PHOS 3.1  --   --   --  5.2*  --    GFR: Estimated Creatinine Clearance: 15.3 mL/min (A) (by C-G formula based on SCr of 3.88 mg/dL (H)). Liver Function Tests:  Recent Labs Lab 05/01/17 1642 05/02/17 0528 05/03/17 0713  AST  --  15  --   ALT  --  18  --   ALKPHOS  --  92  --   BILITOT  --  0.7  --   PROT  --  5.4*  --   ALBUMIN 2.3* 2.2* 2.1*   No results for input(s): LIPASE, AMYLASE in the last 168 hours. No results for input(s): AMMONIA in the last 168 hours. Coagulation Profile:  Recent Labs Lab 05/04/17 0435  INR 1.15   Cardiac Enzymes: No results for input(s): CKTOTAL, CKMB, CKMBINDEX, TROPONINI in the last 168 hours. BNP (last 3 results) No results for input(s): PROBNP in the last 8760 hours. HbA1C:  Recent Labs  05/02/17 0528  HGBA1C 6.3*   CBG:  Recent Labs Lab 05/03/17 1222 05/03/17 1719 05/03/17 2117 05/04/17 0721 05/04/17 1118  GLUCAP 106* 144* 112* 85 88   Lipid Profile: No results for input(s): CHOL, HDL, LDLCALC, TRIG, CHOLHDL, LDLDIRECT in the last 72 hours. Thyroid Function Tests:  Recent Labs  05/01/17 1745  TSH 1.951   Anemia Panel: No results for input(s): VITAMINB12, FOLATE, FERRITIN, TIBC, IRON, RETICCTPCT in the last 72 hours. Sepsis Labs:  Recent Labs Lab 05/01/17 1745 05/01/17 1812 05/03/17 0603  PROCALCITON 0.45  --  0.35  LATICACIDVEN  --  1.29  --     Recent Results (from the past 240 hour(s))  Blood culture (routine x 2)     Status: None (Preliminary result)    Collection Time: 05/01/17  5:45 PM  Result Value Ref Range Status   Specimen Description BLOOD RIGHT ANTECUBITAL  Final   Special Requests   Final    BOTTLES DRAWN AEROBIC AND ANAEROBIC Blood Culture adequate volume   Culture NO GROWTH 3 DAYS  Final   Report Status PENDING  Incomplete  Blood culture (routine x 2)     Status: None (Preliminary result)   Collection Time: 05/01/17  6:00 PM  Result Value Ref Range Status   Specimen Description BLOOD RIGHT HAND  Final   Special Requests   Final    BOTTLES DRAWN AEROBIC ONLY Blood Culture adequate volume   Culture NO GROWTH 3 DAYS  Final   Report Status PENDING  Incomplete       Radiology Studies: No results found.    Scheduled Meds: . [MAR Hold] aspirin EC  81 mg Oral QHS  . butamben-tetracaine-benzocaine  1 spray Topical Once  . [MAR Hold] calcium acetate  667 mg Oral TID WC  . [MAR Hold] Chlorhexidine Gluconate Cloth  6 each Topical Q0600  . [MAR Hold] cycloSPORINE modified  75 mg Oral Daily   And  . [MAR Hold] cycloSPORINE modified  50 mg Oral q1800  . [MAR Hold] darbepoetin (ARANESP) injection - DIALYSIS  40 mcg Intravenous Q Thu-HD  . [MAR Hold] donepezil  10 mg Oral QHS  . [MAR Hold] FLUoxetine  20 mg Oral Daily  . [MAR Hold] heparin  5,000 Units Subcutaneous Q8H  . [MAR Hold] hydrALAZINE  25 mg Oral TID  . [MAR Hold] insulin aspart  0-9 Units Subcutaneous TID WC  . [MAR Hold] levothyroxine  75 mcg Oral QAC breakfast  . lidocaine  1 application Topical Once  . [MAR Hold] metoprolol tartrate  25 mg Oral BID  . [facility]  Hold] mupirocin ointment   Nasal BID  . [MAR Hold] mycophenolate  250 mg Oral Once per day on Sun Mon Wed Fri   And  . [MAR Hold] mycophenolate  500 mg Oral Once per day on Tue Thu Sat  . [MAR Hold] mycophenolate  500 mg Oral Daily  . [MAR Hold] pantoprazole  20 mg Oral Daily  . [MAR Hold] pravastatin  10 mg Oral QHS  . [MAR Hold] vitamin B-12  1,000 mcg Oral Q breakfast   Continuous Infusions: . [MAR  Hold] cefUROXime (ZINACEF)  IV    . [MAR Hold] vancomycin       LOS: 3 days    Time spent: 30 minutes   Dessa Phi, DO [facility] [email or web address] Password [email or web address] 05/04/2017, 1:07 PM

## 2017-05-04 NOTE — Transfer of Care (Signed)
Immediate Anesthesia Transfer of Care Note  Patient: [name] [name]  Procedure(s) Performed: Procedure(s): REMOVAL OF ULCERATION OF ARTERIOVENOUS FISTULA (Left) BRONCHIAL LAVAGE  Patient Location: PACU  Anesthesia Type:General  Level of Consciousness: awake and patient cooperative  Airway & Oxygen Therapy: Patient Spontanous Breathing and Patient connected to nasal cannula oxygen  Post-op Assessment: Report given to RN, Post -op Vital signs reviewed and stable and Patient moving all extremities X 4  Post vital signs: Reviewed and stable  Last Vitals:  Vitals:   05/04/17 0613 05/04/17 1057  BP: (!) 76/49 (!) 128/58  Pulse: 66 65  Resp: 17 16  Temp: 36.6 C 36.4 C    Last Pain:  Vitals:   05/04/17 1132  TempSrc:   PainSc: Asleep      Patients Stated Pain Goal: 2 (72/82/06 0156)  Complications: No apparent anesthesia complications

## 2017-05-04 NOTE — Discharge Instructions (Signed)
° ° °  05/04/2017 Konstantinos Cordoba [id number] 1940-12-15  Surgeon(s): Early, Arvilla Meres, MD Javier Glazier, MD  Procedure(s): REMOVAL OF ULCERATION OF ARTERIOVENOUS FISTULA VIDEO BRONCHOSCOPY with bronchial lavage  x May stick graft on designated area only:  Do NOT stick fistula over incision x 4 weeks. May stick above incision immediately.

## 2017-05-04 NOTE — Progress Notes (Signed)
[facility] Progress Note  Dialysis Orders: [facility] T, Th,S 4 hrs 2.0 K/2.25 Ca 61kg  400/Auto 1.5 -No Heparin  -Venofer 50mg  IV weekly (Last dose 04/12/17 Fe 106 Tsat 45% 04/12/17)   Assessment/Plan:  1. Bilateral Infiltrates with 4.4 cm RUL cavitary lesion with other multiple nodules some which are cavitary in immunosuppressed pt, small right pleural effusion with loculated fluid: Per primary. BC no growth to date  ID consulted. Possible MRSA Bacteremia/Possible endocarditis/possible fungal infection/neoplasm. ABX narrowed to vanc. Pulmonary and cardiology consulted. Last ECHO 6/6 - no vegetations 2. ESRD -T, Th, S. On HD per schedule. K 3.9 - start on 3 K bath on Saturday 3. Anemia - Hgb 9  Rec'd  Aranesp 40 mcg 6/14. No venofer until [facility] results final  4. Secondary hyperparathyroidism - Ca 7.9 Phos 5.2. cont binders, no VDRA.  5. HTN/volume - EDW lowered to 61 kg post last hospitalization-has not been able to reach in HD center however wt here 61.2 kg bed wt. However pre wt Thursday was 66 kg . Continue metoprolol and hydralazine. Also reportedly taking amlodipine 5 mg at HS -off for now   NET UF Thursday 1 L with post bed weight of 65- plan get standing weight pre HD Saturday to more accurately assess EDW and check for orthostasis. BP are highly variable 6. Nutrition - Albumin 2.1 Renal/Carb mod diet, prostat and renal vit - NPO for procedure 7. DM: per primary  8. Ulcerated AVF: Seen by [facility]. For revision of AVF per Dr. Oneida Alar 05/04/17 9. H/O Cardiac transplant: Cont immunosuppressant therapy   Myriam Jacobson, PA-C [place] [phone] 05/04/2017,10:29 AM  LOS: 3 days   Pt seen, examined and agree w A/P as above.  [name] MD [employer] pager [phone]   05/04/2017, 12:52 PM    Subjective:   Recognizes me. Walks around the house some- can stand for wt though sometimes has WC weight at dialysis Objective Vitals:    05/03/17 1059 05/03/17 1714 05/03/17 2107 05/04/17 0613  BP: 104/78 (!) 148/55 (!) 141/37 (!) 76/49  Pulse: 78 66 70 66  Resp: 18  17 17   Temp: 98 F (36.7 C) 98.5 F (36.9 C) 98.3 F (36.8 C) 97.8 F (36.6 C)  TempSrc: Oral Oral Oral Oral  SpO2: 99% 99% 100% 100%  Weight: 65 kg (143 lb 4.8 oz)  67 kg (147 lb 11.3 oz)   Height:       Physical Exam General: pleasant NAD, thin, elderly chronically ill, comfortably at bed rest Heart: RRR Lungs: dim BS Abdomen: soft NT + BS Extremities:no LE edema Dialysis Access: left AVF + bruit with very high pitched whistling sound distally   Additional Objective Labs: Basic Metabolic Panel:  Recent Labs Lab 05/01/17 1642  05/03/17 0603 05/03/17 0713 05/04/17 0435  NA 135  < > 136 136 136  K 3.7  < > 4.0 4.0 3.9  CL 94*  < > 96* 96* 95*  CO2 29  < > 27 26 29   GLUCOSE 165*  < > 103* 122* 86  BUN 34*  < > 49* 49* 17  CREATININE 4.99*  < > 7.52* 7.36* 3.88*  CALCIUM 8.2*  < > 8.0* 7.9* 8.1*  PHOS 3.1  --   --  5.2*  --   < > = values in this interval not displayed. Liver Function Tests:  Recent Labs Lab 05/01/17 1642 05/02/17 0528 05/03/17 0713  AST  --  15  --  ALT  --  18  --   ALKPHOS  --  92  --   BILITOT  --  0.7  --   PROT  --  5.4*  --   ALBUMIN 2.3* 2.2* 2.1*   No results for input(s): LIPASE, AMYLASE in the last 168 hours. CBC:  Recent Labs Lab 05/01/17 1642 05/01/17 1745 05/02/17 0528 05/03/17 0603 05/04/17 0435  WBC 9.7 10.9* 8.8 7.7 7.2  NEUTROABS  --   --   --  4.7 3.8  HGB 10.0* 8.6* 9.4* 8.8* 9.0*  HCT 31.6* 27.2* 30.0* 27.9* 29.3*  MCV 95.2 95.8 95.8 96.2 99.3  PLT 347 389 349 362 403*   Blood Culture    Component Value Date/Time   SDES BLOOD RIGHT HAND 05/01/2017 1800   SPECREQUEST  05/01/2017 1800    BOTTLES DRAWN AEROBIC ONLY Blood Culture adequate volume   CULT NO GROWTH 2 DAYS 05/01/2017 1800   REPTSTATUS PENDING 05/01/2017 1800    Cardiac Enzymes: No results for input(s):  CKTOTAL, CKMB, CKMBINDEX, TROPONINI in the last 168 hours. CBG:  Recent Labs Lab 05/02/17 2312 05/03/17 1222 05/03/17 1719 05/03/17 2117 05/04/17 0721  GLUCAP 142* 106* 144* 112* 85   Iron Studies: No results for input(s): IRON, TIBC, TRANSFERRIN, FERRITIN in the last 72 hours. Lab Results  Component Value Date   INR 1.15 05/04/2017   INR 1.02 02/03/2017   INR 1.19 05/04/2016   Studies/Results: No results found. Medications: . cefUROXime (ZINACEF)  IV    . vancomycin     . aspirin EC  81 mg Oral QHS  . butamben-tetracaine-benzocaine  1 spray Topical Once  . calcium acetate  667 mg Oral TID WC  . Chlorhexidine Gluconate Cloth  6 each Topical Q0600  . cycloSPORINE modified  75 mg Oral Daily   And  . cycloSPORINE modified  50 mg Oral q1800  . darbepoetin (ARANESP) injection - DIALYSIS  40 mcg Intravenous Q Thu-HD  . donepezil  10 mg Oral QHS  . FLUoxetine  20 mg Oral Daily  . heparin  5,000 Units Subcutaneous Q8H  . hydrALAZINE  25 mg Oral TID  . insulin aspart  0-9 Units Subcutaneous TID WC  . levothyroxine  75 mcg Oral QAC breakfast  . lidocaine  1 application Topical Once  . metoprolol tartrate  25 mg Oral BID  . mupirocin ointment   Nasal BID  . mycophenolate  250 mg Oral Once per day on Sun Mon Wed Fri   And  . mycophenolate  500 mg Oral Once per day on Tue Thu Sat  . mycophenolate  500 mg Oral Daily  . pantoprazole  20 mg Oral Daily  . pravastatin  10 mg Oral QHS  . vitamin B-12  1,000 mcg Oral Q breakfast

## 2017-05-04 NOTE — Anesthesia Postprocedure Evaluation (Signed)
Anesthesia Post Note  Patient: [name] [name]  Procedure(s) Performed: Procedure(s) (LRB): REMOVAL OF ULCERATION OF ARTERIOVENOUS FISTULA (Left) BRONCHIAL LAVAGE     Patient location during evaluation: PACU Anesthesia Type: General Level of consciousness: awake, awake and alert and oriented Pain management: pain level controlled Vital Signs Assessment: post-procedure vital signs reviewed and stable Respiratory status: spontaneous breathing, nonlabored ventilation and respiratory function stable Cardiovascular status: blood pressure returned to baseline Anesthetic complications: no    Last Vitals:  Vitals:   05/04/17 1730 05/04/17 1748  BP: (!) 162/65 (!) 157/51  Pulse: 65 68  Resp: (!) 21 18  Temp: 36.4 C 36.6 C    Last Pain:  Vitals:   05/04/17 1748  TempSrc: Oral  PainSc:                  Dick Hark COKER

## 2017-05-04 NOTE — OR Nursing (Signed)
[name] [name] updated via phone to pt condition and return to room. Questions answered.

## 2017-05-04 NOTE — Op Note (Signed)
    OPERATIVE REPORT  DATE OF SURGERY: 05/04/2017  PATIENT: [name] [name], 76 y.o. male MRN: [id number]  DOB: 09-May-1941  PRE-OPERATIVE DIAGNOSIS: Ulceration over left upper arm AV fistula  POST-OPERATIVE DIAGNOSIS:  Same  PROCEDURE: Revision of left upper arm AV fistula with excision of ellipse of skin and vein and repair of vein and closure of skin  SURGEON:  Curt Jews, M.D.  PHYSICIAN ASSISTANT: Samantha Rhyne PA-C  ANESTHESIA:  Gen.  EBL: Minimal ml  Total I/O In: 500 [I.V.:500] Out: 0   BLOOD ADMINISTERED: None  DRAINS: None  SPECIMEN: None  COUNTS CORRECT:  YES  PLAN OF CARE: Bronchoscopy with Dr. Ashok Cordia   PATIENT DISPOSITION:  PACU - hemodynamically stable  PROCEDURE DETAILS: Patient was taking up in place supervision. The left arm from [facility] sterile fashion. An ellipse of skin was made. The vein was quite adherent to the skin and the vein was entered. A pneumatic tourniquet was placed in the upper arm above the fistula area of erosion the l arm was elevated and exsanguinated and the pneumatic tourniquet was inflated. The vein was mobilized under the skin and an ellipse of vein was removed. The vein was treated back to healthy vein was closed with a running 5-0 Prolene suture. The tourniquet was deflated and several additional sutures were required for hemostasis. The skin was closed with 3-0 nylon mattress sutures over the vein. The wound was dressed with 4 x 4 and the patient will then undergo bronchoscopy which be dictated as a separate note   Rosetta Posner, M.D., [facility] 05/04/2017 4:42 PM

## 2017-05-04 NOTE — Consult Note (Signed)
[facility] Attending Note  ADMISSION DATE:05/01/2017  CONSULTATION DATE:05/02/2017  REFERRING MD:Dr. Maylene Roes   CHIEF COMPLAINT:Cavitary Lung Lesion   Presenting HPI:  76 y.o. 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 He was recently admitted from 6/2-6/7 with bacteremia. The patient was on vacation in [place], [place] the week prior when he noted a wound on his left arm (6/1). He was prescribed Augmentin and topical mupirocin ointment and sent home. That evening, he became altered and was taken to [facility] in [place], [place] (phone# [phone]). CT of the head, abdomen and blood cultures obtained. They left vacation and drove to [facility] for further medical evaluation. He was seen in [place] and found to have bilateral infiltrates worrisome for HCAP. He was treated with IV vancomycin &zosyn. Repeat blood cultures at [facility] were negative. He was evaluated by ID. Family reported at that time that they thought it was streptococcal bacteremia. He was discharged on Augmentin and instructed to follow up with Dermatology after discharge. He was discharged to a SNF for rehab.  returned with shortness of breath and nonproductive cough as well as diffuse pain. Patient found to have cavitary lung lesions prompting consultation of the Pulmonary Service.  Subjective:   mild intermittent nonproductive cough. No abdominal pain or nausea.  Review of Systems:  No chest pain or pressure. No subjective fever or chills or sweats.  Temp:  [97.6 F (36.4 C)-98.5 F (36.9 C)] 97.6 F (36.4 C) (06/15 1057) Pulse Rate:  [65-70] 65 (06/15 1057) Resp:  [16-17] 16 (06/15 1057) BP: (76-148)/(37-58) 128/58 (06/15 1057) SpO2:  [99 %-100 %] 100 % (06/15 1057) Weight:  [147 lb 11.3 oz (67 kg)] 147 lb 11.3 oz (67 kg)  (06/14 2107)  General:  Awake. Alert. No distress.  Integument:  Warm & dry. No rash on exposed skin.  Extremities:  No cyanosis or clubbing.  HEENT:  Moist mucus membranes. No oral ulcers. No scleral injection or icterus. Mallampati class III. Cardiovascular:  Regular rate. No edema. No appreciable JVD.  Pulmonary:  Good aeration & clear to auscultation bilaterally. Symmetric chest wall expansion. No accessory muscle use. Abdomen: Soft. Normal bowel sounds. Nondistended.  Musculoskeletal:  Normal bulk and tone. No joint deformity or effusion appreciated.   CBC Latest Ref Rng & Units 05/04/2017 05/03/2017 05/02/2017  WBC 4.0 - 10.5 K/uL 7.2 7.7 8.8  Hemoglobin 13.0 - 17.0 g/dL 9.0(L) 8.8(L) 9.4(L)  Hematocrit 39.0 - 52.0 % 29.3(L) 27.9(L) 30.0(L)  Platelets 150 - 400 K/uL 403(H) 362 349    BMP Latest Ref Rng & Units 05/04/2017 05/03/2017 05/03/2017  Glucose 65 - 99 mg/dL 86 122(H) 103(H)  BUN 6 - 20 mg/dL 17 49(H) 49(H)  Creatinine 0.61 - 1.24 mg/dL 3.88(H) 7.36(H) 7.52(H)  Sodium 135 - 145 mmol/L 136 136 136  Potassium 3.5 - 5.1 mmol/L 3.9 4.0 4.0  Chloride 101 - 111 mmol/L 95(L) 96(L) 96(L)  CO2 22 - 32 mmol/L 29 26 27   Calcium 8.9 - 10.3 mg/dL 8.1(L) 7.9(L) 8.0(L)   IMAGING/STUDIES: CT CHEST W/O 6/12:  Previously reviewed by me. No pathologic mediastinal adenopathy. No pericardial effusion. Very small pleural effusion on the right versus pleural thickening with some calcification. Cavitary opacity within the lateral, subpleural right upper lobe as well as nodular opacities within left upper lobe and lingula at least one of which is cavitary. Splenic calcification  noted.  MICROBIOLOGY: Blood Cultures x2 6/3:  Negative MRSA PCR 6/3:  Positive Blood Cultures x2 6/12 >>>  ANTIBIOTICS: Cefepime 6/12 (x1 dose) Zosyn 6/13 (x1 dose) Cefuroxime 6/15 (x1 dose for surgical prophylaxis) Vancomycin 6/12 >>>  ASSESSMENT/PLAN:  76 y.o. male with immunosuppression and bilateral lung  nodules with some element of cavitation suggesting an infectious process. Known underlying bacteremia. Plan for bronchoscopy with general anesthesia today. I will perform lavage at 2 separate locations.  PCCM will be available as needed over the weekend and see the patient again on Monday (6/18) unless sooner if needed. Please let us know if there are questions or concerns.  Sonia Baller Ashok Cordia, M.D. [facility] Pager:  [phone] After 3pm or if no response, call [phone] 2:14 PM 05/04/17

## 2017-05-04 NOTE — Care Management Important Message (Signed)
Important Message  Patient Details  Name: [name] [name] MRN: [id number] Date of Birth: [birth year]   Medicare Important Message Given:  Yes    Burnetta Kohls, Rory Percy, RN 05/04/2017, 4:36 PM

## 2017-05-04 NOTE — Interval H&P Note (Signed)
History and Physical Interval Note:  05/04/2017 1:11 PM  Saket Hellstrom  has presented today for surgery, with the diagnosis of ulceration of fistula  The various methods of treatment have been discussed with the patient and family. 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  The patient's history has been reviewed, patient examined, no change in status, stable for surgery.  I have reviewed the patient's chart and labs.  Questions were answered to the patient's satisfaction.     Curt Jews

## 2017-05-04 NOTE — H&P (View-Only) (Signed)
Hospital Consult    Reason for Consult:  Ulcer on fistula Requesting Physician:  Juanell Fairly, NP MRN #:  [id number]  History of Present Illness: This is a 76 y.o. male who was admitted to the hospital yesterday with shortness of breath via EMS from the dialysis center.  His cxr in the ER showed worsening infiltrates in the RUL and left mid lung consistent with PNA and possible central lucency in the RUL suggesting possible cavitary lesion.   He has been placed on broad spectrum abx and admitted for HCAP.    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  He dialyzes T/T/S.  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 Donnetta Hutching.  He has since had balloon angioplasty of the left innominate vein and stent placement in the left axillary vein with 6mm viabahn stent by Dr. Donzetta Matters in 2017.    Pt states that his fistula has not bled to his knowledge.    The pt is on a statin for cholesterol management.  He is on a daily aspirin.  He takes a beta blocker and hydralazine for BP control.  He is on synthroid for hypothyroidism.  Past Medical History:  Diagnosis Date  . Anemia   . Cancer ([place])    SKIN  . CKD III-IV    a. since transplant in 2001 with significant worsening since 06/2013.  Marland Kitchen Coronary artery disease   . Dementia    mild  . Diabetes mellitus without complication (HCC)    FASTING CBG 90S  . Headache(784.0)   . Heart transplanted ([place])    a. 1995 CABG x 4 in [place], [place];  b. 2001 Developed CHF;  c. 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 10/2000 Cardiac transplant @ [facility], [place];  e. Historically nl Bx and stress testing.  Last stress test ~ 5 yrs ago.  Marland Kitchen Hernia of abdominal wall   . History of blood transfusion   . History of stroke   . Hypercalcemia    a. 06/2013 Hospitalized in [place].  Marland Kitchen Hyperlipidemia   . Hypertension    a. Long h/o HTN, came off of all meds following transplant/wt loss-->meds resumed 06/2013, difficult to control since.  . Myocardial infarction ([place])    PRIOR TO HEART TRANSPLANT  . Orthostatic dizziness   . Orthostatic hypotension    a. Has tried support hose and compression stockings - prefers not to wear.  . Shortness of breath    EXERTION  . Stroke ([place]) 06/2013   NO RESIDUAL - WAS CONFUSED AT TIME - THAT HAS RESOLVED    Past Surgical History:  Procedure Laterality Date  . BASCILIC VEIN TRANSPOSITION Left 07/06/2014   Procedure: [place];  Surgeon: Rosetta Posner, MD;  Location: [facility];  Service: Vascular;  Laterality: Left;  . CARDIAC SURGERY    . CATARACT EXTRACTION    . CORONARY ARTERY BYPASS GRAFT    . CRANIOTOMY Right 05/04/2016   Procedure: Right Craniotomy for Evacuation of Subdural Hematoma;  Surgeon: Earnie Larsson, MD;  Location: [facility] NEURO ORS;  Service: Neurosurgery;  Laterality: Right;  . EYE SURGERY Bilateral    CATARACTS  . HEART TRANSPLANT    . PERIPHERAL VASCULAR CATHETERIZATION N/A 09/06/2016   Procedure: Fistulagram - left arm;  Surgeon: Waynetta Sandy, MD;  Location: [facility] CV LAB;  Service: Cardiovascular;  Laterality: N/A;  . PERIPHERAL VASCULAR CATHETERIZATION Left 09/06/2016   Procedure: Peripheral Vascular Intervention;  Surgeon: Waynetta Sandy, MD;  Location: [facility];  Service: Cardiovascular;  Laterality: Left;  UPPER ARM COVERED STENT  . SKIN GRAFT FULL THICKNESS LEG     cat scratch that did not heal  . VASECTOMY      Allergies  Allergen Reactions  . Norvasc [Amlodipine Besylate] Swelling and Other (See Comments)    Swelling of the lower body     Prior to Admission medications   Medication Sig Start Date End Date Taking? Authorizing Provider  acetaminophen (TYLENOL) 325 MG tablet Take 2 tablets (650 mg total) by mouth every 6 (six) hours as needed for mild pain (or Fever >/= 101). 10/14/14   Delfina Redwood, MD  aspirin EC 81 MG tablet Take 81 mg by mouth at bedtime.    [provider]  calcium acetate (PHOSLO) 667 MG capsule Take 1  capsule (667 mg total) by mouth 3 (three) times daily with meals. 08/24/16   Ghimire, Henreitta Leber, MD  cholecalciferol (VITAMIN D) 1000 UNITS tablet Take 1,000 Units by mouth daily.    [provider]  cycloSPORINE modified (NEORAL) 25 MG capsule Take 50-75 mg by mouth See admin instructions. 75 mg in the morning and 50 mg in the evening 08/18/14   Lelon Perla, MD  diphenoxylate-atropine (LOMOTIL) 2.5-0.025 MG tablet Take 1 tablet by mouth 4 (four) times daily as needed for diarrhea or loose stools. 08/24/16   Ghimire, Henreitta Leber, MD  donepezil (ARICEPT) 10 MG tablet Take 10 mg by mouth at bedtime.  03/01/15   [provider]  FLUoxetine (PROZAC) 20 MG capsule Take 20 mg by mouth daily.     [provider]  fluticasone (FLONASE) 50 MCG/ACT nasal spray Place 2 sprays into both nostrils at bedtime as needed for allergies.  04/12/16   [provider]  hydrALAZINE (APRESOLINE) 25 MG tablet Take 1 tablet (25 mg total) by mouth 3 (three) times daily. 04/25/17   Patrecia Pour, MD  lansoprazole (PREVACID) 30 MG capsule Take 30 mg by mouth 2 (two) times daily before a meal.  12/22/15   [provider]  levothyroxine (SYNTHROID, LEVOTHROID) 75 MCG tablet Take 75 mcg by mouth daily.     [provider]  metoprolol tartrate (LOPRESSOR) 25 MG tablet Take 2 tablets (50 mg total) by mouth 2 (two) times daily. 12/27/14   Rai, Vernelle Emerald, MD  multivitamin (RENA-VIT) TABS tablet Take 1 tablet by mouth daily.  03/08/15   [provider]  mycophenolate (CELLCEPT) 250 MG capsule Take 250-500 mg by mouth daily. Take 500 mg every morning and take 250 mg every evening on Sunday, Monday, Wednesday and Friday then take 500 mg every evening on Tuesday Thursday and Saturday    [provider]  Nutritional Supplements (FEEDING SUPPLEMENT, NEPRO CARB STEADY,) LIQD Take 237 mLs by mouth 2 (two) times daily between meals. 04/25/17   Patrecia Pour, MD  pravastatin  (PRAVACHOL) 10 MG tablet Take 10 mg by mouth at bedtime.     [provider]  Probiotic Product (PRO-BIOTIC BLEND) CAPS Take 1 capsule by mouth daily.    [provider]  vitamin B-12 (CYANOCOBALAMIN) 1000 MCG tablet Take 1,000 mcg by mouth daily with breakfast.    [provider]    Social History   Social History  . Marital status: Married    Spouse name: N/A  . Number of children: 1  . Years of education: N/A   Occupational History  . Education officer, community  Social History Main Topics  . Smoking status: Never Smoker  . Smokeless tobacco: Never Used  . Alcohol use No  . Drug use: No  . Sexual activity: Not on file   Other Topics Concern  . Not on file   Social History Narrative   Patient is from [place].  Moved to [place] in late '90's.  Moved to [place] 06/20/2014 to live closer to son.  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  Plans to continue to work now that he is in [place].     Family History  Problem Relation Age of Onset  . Heart attack Father        died @ 23  . Diabetes Father   . Heart attack Mother   . Diabetes Mother   . Kidney disease Maternal Aunt     ROS: [x]  Positive   [ ]  Negative   [ ]  All sytems reviewed and are negative  Cardiac: []  chest pain/pressure []  palpitations [x]  SOB []  DOE  Vascular: []  pain in legs while walking []  pain in legs at rest []  pain in legs at night []  non-healing ulcers []  hx of DVT []  swelling in legs  Pulmonary: []  productive cough []  asthma/wheezing []  home O2  Neurologic: []  weakness in []  arms []  legs []  numbness in []  arms []  legs [x]  hx of CVA []  mini stroke [] difficulty speaking or slurred speech []  temporary loss of vision in one eye []  dizziness   Hematologic: [x]  hx of cancer-skin []  bleeding problems []  problems with blood clotting easily  Endocrine:   []  diabetes [x]  thyroid disease  GI []  vomiting blood []  blood in stool  GU: [x]   CKD/renal failure [x]  HD--[]  M/W/F or [x]  T/T/S []  burning with urination []  blood in urine  Psychiatric: []  anxiety []  depression  Musculoskeletal: []  arthritis []  joint pain  Integumentary: []  rashes []  ulcers  Constitutional: []  fever []  chills   Physical Examination  Vitals:   05/02/17 0605 05/02/17 1034  BP: (!) 193/71 (!) 111/41  Pulse: 73 66  Resp: 19 18  Temp: 98.4 F (36.9 C) 97.5 F (36.4 C)   Body mass index is 19.37 kg/m.  General:  Appearing thin in NAD Gait: Not observed HENT: WNL, normocephalic Pulmonary: normal non-labored breathing on room air Cardiac: regular Abdomen:  soft, NT/ND, no masses Skin: without rashes Vascular Exam/Pulses: Easily palpable thrill; easily palpable left radial pulse.  2 ulcerations over left arm fistula, but skin is mobile over the incision.  Extremities: without ischemic changes, without Gangrene , without cellulitis; without open wounds;  Musculoskeletal: no muscle wasting or atrophy  Neurologic: A&O X 3;  No focal weakness or paresthesias are detected; speech is fluent/normal Psychiatric:  The pt has flat affect.   CBC    Component Value Date/Time   WBC 8.8 05/02/2017 0528   RBC 3.13 (L) 05/02/2017 0528   HGB 9.4 (L) 05/02/2017 0528   HCT 30.0 (L) 05/02/2017 0528   PLT 349 05/02/2017 0528   MCV 95.8 05/02/2017 0528   MCH 30.0 05/02/2017 0528   MCHC 31.3 05/02/2017 0528   RDW 14.8 05/02/2017 0528   LYMPHSABS 2.5 04/26/2017 0427   MONOABS 0.9 04/26/2017 0427   EOSABS 0.1 04/26/2017 0427   BASOSABS 0.0 04/26/2017 0427    BMET    Component Value Date/Time   NA 134 (L) 05/02/2017 0528   NA 132 (A) 05/01/2017   K 3.6 05/02/2017 0528   CL  95 (L) 05/02/2017 0528   CO2 26 05/02/2017 0528   GLUCOSE 104 (H) 05/02/2017 0528   BUN 41 (H) 05/02/2017 0528   BUN 80 (A) 05/01/2017   CREATININE 6.32 (H) 05/02/2017 0528   CALCIUM 7.7 (L) 05/02/2017 0528   GFRNONAA 8 (L) 05/02/2017 0528   GFRAA 9 (L) 05/02/2017  0528    COAGS: Lab Results  Component Value Date   INR 1.02 02/03/2017   INR 1.19 05/04/2016   INR 1.11 12/26/2014     Non-Invasive Vascular Imaging:   none  Statin:  Yes.   Beta Blocker:  Yes.   Aspirin:  Yes.   ACEI:  No. ARB:  No. CCB use:  No Other antiplatelets/anticoagulants:  Yes, SQ heparin   ASSESSMENT/PLAN: This is a 76 y.o. male with left arm fistula with 2 small ulcerations over fistula   -pt has 2 small ulcerations over fistula, but skin is mobile over the fistula.  He denies any bleeding from the fistula.  Dr. Oneida Alar is recommending revision of fistula.  Pt dialyzes on T/T/S & we will plan to schedule this for Friday May 04, 2017.     Leontine Locket, PA-C [facility] [phone]   History and exam findings as above.  Pt has 2 areas one with slightly blistered skin 4 cm from antecubital crease one with slightly ulcerated thinned skin 7 cm from antecubital crease.  Both of these have some risk for bleeding Will schedule pt for excision of these two areas by Dr Early on Friday if pt is overall stable from his pulmonary standpoint.  Ruta Hinds, MD [facility] Office: [phone] Pager: [phone]

## 2017-05-04 NOTE — Progress Notes (Signed)
    [facility] for Infectious Disease   Reason for visit: Follow up on bacteremia, cavitary lesions  Interval History: blood cultures are still negative, he feels 'good' today, no complaints.  No associated n/v/d.   Physical Exam: Constitutional:  Vitals:   05/04/17 0613 05/04/17 1057  BP: (!) 76/49 (!) 128/58  Pulse: 66 65  Resp: 17 16  Temp: 97.8 F (36.6 C) 97.6 F (36.4 C)   patient appears in NAD Eyes: anicteric Respiratory: Normal respiratory effort; CTA B Cardiovascular: RRR  GI: soft, nt, nd  Review of Systems: Constitutional: negative for fevers and chills Gastrointestinal: negative for diarrhea Skin: no rashes  Lab Results  Component Value Date   WBC 7.2 05/04/2017   HGB 9.0 (L) 05/04/2017   HCT 29.3 (L) 05/04/2017   MCV 99.3 05/04/2017   PLT 403 (H) 05/04/2017    Lab Results  Component Value Date   CREATININE 3.88 (H) 05/04/2017   BUN 17 05/04/2017   NA 136 05/04/2017   K 3.9 05/04/2017   CL 95 (L) 05/04/2017   CO2 29 05/04/2017    Lab Results  Component Value Date   ALT 18 05/02/2017   AST 15 05/02/2017   ALKPHOS 92 05/02/2017     Microbiology: Recent Results (from the past 240 hour(s))  Blood culture (routine x 2)     Status: None (Preliminary result)   Collection Time: 05/01/17  5:45 PM  Result Value Ref Range Status   Specimen Description BLOOD RIGHT ANTECUBITAL  Final   Special Requests   Final    BOTTLES DRAWN AEROBIC AND ANAEROBIC Blood Culture adequate volume   Culture NO GROWTH 2 DAYS  Final   Report Status PENDING  Incomplete  Blood culture (routine x 2)     Status: None (Preliminary result)   Collection Time: 05/01/17  6:00 PM  Result Value Ref Range Status   Specimen Description BLOOD RIGHT HAND  Final   Special Requests   Final    BOTTLES DRAWN AEROBIC ONLY Blood Culture adequate volume   Culture NO GROWTH 2 DAYS  Final   Report Status PENDING  Incomplete    Impression/Plan:  1. MRSA bacteremia - on vancomycin with  dialysis and will continue.  TEE Monday to see if vegetation on valve.  Blood cultures here remain negative.  2.  Cavitary lesions - for BAL today for broad differential.  Will monitor those cultures.   3.  Heart transplant - on immunosuppressive medications.    4.  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

## 2017-05-04 NOTE — Anesthesia Procedure Notes (Signed)
Procedure Name: Intubation Date/Time: 05/04/2017 3:22 PM Performed by: Julieta Bellini Pre-anesthesia Checklist: Patient identified, Emergency Drugs available, Suction available and Patient being monitored Patient Re-evaluated:Patient Re-evaluated prior to inductionOxygen Delivery Method: Circle system utilized Preoxygenation: Pre-oxygenation with 100% oxygen Intubation Type: IV induction Ventilation: Mask ventilation without difficulty Laryngoscope Size: Mac and 4 Grade View: Grade I Tube type: Oral Tube size: 8.5 mm Number of attempts: 1 Airway Equipment and Method: Stylet Placement Confirmation: ETT inserted through vocal cords under direct vision,  positive ETCO2 and breath sounds checked- equal and bilateral Secured at: 24 cm Tube secured with: Tape Dental Injury: Teeth and Oropharynx as per pre-operative assessment

## 2017-05-04 NOTE — Progress Notes (Signed)
Pharmacy Antibiotic Note [name] [name] is a 76 y.o. male admitted on 05/01/2017 with MRSA bacteremia.  Patient with previously reported strep bacteremia from outside hospital in [place]. Upon CCM review of records, noted to actually be MRSA bacteremia. Pt currently on vancomycin with IHD. TEE planned for Monday.   Plan: - Vancomycin 500 mg IV qTTS (HD) - Follow up blood cultures - Monitor renal function, CBC and vancomycin levels as indicated  Height: 5\' 10"  (177.8 cm) Weight: 147 lb 11.3 oz (67 kg) IBW/kg (Calculated) : 73  Temp (24hrs), Avg:98.2 F (36.8 C), Min:97.8 F (36.6 C), Max:98.5 F (36.9 C)   Recent Labs Lab 05/01/17 1642 05/01/17 1745 05/01/17 1812 05/02/17 0528 05/03/17 0603 05/03/17 0713 05/04/17 0435  WBC 9.7 10.9*  --  8.8 7.7  --  7.2  CREATININE 4.99* 5.43*  --  6.32* 7.52* 7.36* 3.88*  LATICACIDVEN  --   --  1.29  --   --   --   --     Estimated Creatinine Clearance: 15.3 mL/min (A) (by C-G formula based on SCr of 3.88 mg/dL (H)).    Allergies  Allergen Reactions  . Norvasc [Amlodipine Besylate] Swelling and Other (See Comments)    Swelling of the lower body     Antimicrobials this admission:  Vancomycin 6/12 >>  Cefepime x1 Zosyn x1  Microbiology results: 6/12 BCx: ngtd  Thank you for allowing pharmacy to be a part of this patient's care.  Vincenza Hews, PharmD, BCPS 05/04/2017, 9:35 AM

## 2017-05-04 NOTE — Op Note (Signed)
Video Bronchoscopy Procedure Note  Pre-Procedure Diagnoses: 1.  Bilateral Cavitary Lung Nodules  Post-Procedure Diagnoses: 1.  Bilateral Cavitary Lung Nodules  Procedures Performed: 1. Bronchoscopy with Airway Inspection 2. Bronchoalveolar Lavage Left Upper Lobe & Right Upper Lobe  Consent:  Informed consent was obtained from the patient after discussing the risks and benefits of the procedure including bleeding, infection, pneumothorax, medication allergy, vocal chord injury, and potentially death.  Sedation:  Please refer to anesthesia documentation.   Description of Procedure: Procedure performed in the operating room with endotracheal intubation under general anesthesia.  A time out was performed to identify the correct patient and procedure. Flexible bronchoscope was then inserted into the endotracheal tube and proximal trachea.  An airway inspeciton was performed finding mild bilateral airway dilation with scant, clear secretions and normal mucosa.  The bronchoscope was then advanced into the right upper lobe.  A lavage was performed by instilling a total of 180 cc of sterile saline and aspirating a total of 70 cc of cloudy fluid.  The bronchoscope was then repositioned into the left upper lobe.  A lavage was performed by instilling a total of 180 cc of sterile saline and aspirating a total of 90 cc of cloudy fluid with some particulate material. The remaining secretions were suctioned from the patient's airways and the scope was removed.    Blood Loss:  None.  Complications:  None.  Bronchoalveolar Lavage: 1. Performed on a Lateral Subsegment of the Right Upper Lobe:  Sent for cytology, cell count, PCP DFA, Legionella Culture, Viral Culture,  Aspergillus Antigen, Fungal smear & culture, AFB smear & culture, and quantitative culture.  2. Performed on Anterior Segment of the Left Upper Lobe:  Sent for cytology, cell count, PCP DFA, Legionella Culture, Viral Culture,  Aspergillus  Antigen, Fungal smear & culture, AFB smear & culture, and quantitative culture.  Post Procedure Instructions: 1. Post-op care as per anesthesia 2. Follow-up culture and cytology specimens

## 2017-05-04 NOTE — Anesthesia Preprocedure Evaluation (Addendum)
Anesthesia Evaluation    Airway Mallampati: II  TM Distance: >3 FB Neck ROM: Full    Dental  (+) Teeth Intact, Dental Advisory Given   Pulmonary     + decreased breath sounds      Cardiovascular hypertension,  Rhythm:Regular Rate:Normal     Neuro/Psych    GI/Hepatic   Endo/Other  diabetes  Renal/GU      Musculoskeletal   Abdominal   Peds  Hematology   Anesthesia Other Findings   Reproductive/Obstetrics                             Anesthesia Physical Anesthesia Plan  ASA: III  Anesthesia Plan: MAC   Post-op Pain Management:    Induction: Intravenous  PONV Risk Score and Plan: Ondansetron and Propofol  Airway Management Planned: Simple Face Mask and Natural Airway  Additional Equipment:   Intra-op Plan:   Post-operative Plan:   Informed Consent: I have reviewed the patients History and Physical, chart, labs and discussed the procedure including the risks, benefits and alternatives for the proposed anesthesia with the patient or authorized representative who has indicated his/her understanding and acceptance.   Dental advisory given  Plan Discussed with: CRNA and Anesthesiologist  Anesthesia Plan Comments:         Anesthesia Quick Evaluation

## 2017-05-05 ENCOUNTER — Encounter (HOSPITAL_COMMUNITY): Payer: Self-pay | Admitting: Vascular Surgery

## 2017-05-05 LAB — CBC WITH DIFFERENTIAL/PLATELET
Basophils Absolute: 0.1 10*3/uL (ref 0.0–0.1)
Basophils Relative: 1 %
Eosinophils Absolute: 0.1 10*3/uL (ref 0.0–0.7)
Eosinophils Relative: 1 %
HCT: 27.7 % — ABNORMAL LOW (ref 39.0–52.0)
Hemoglobin: 8.4 g/dL — ABNORMAL LOW (ref 13.0–17.0)
Lymphocytes Relative: 26 %
Lymphs Abs: 2.3 10*3/uL (ref 0.7–4.0)
MCH: 29.8 pg (ref 26.0–34.0)
MCHC: 30.3 g/dL (ref 30.0–36.0)
MCV: 98.2 fL (ref 78.0–100.0)
Monocytes Absolute: 0.7 10*3/uL (ref 0.1–1.0)
Monocytes Relative: 8 %
Neutro Abs: 5.6 10*3/uL (ref 1.7–7.7)
Neutrophils Relative %: 64 %
Platelets: 404 10*3/uL — ABNORMAL HIGH (ref 150–400)
RBC: 2.82 MIL/uL — ABNORMAL LOW (ref 4.22–5.81)
RDW: 14.7 % (ref 11.5–15.5)
WBC: 8.7 10*3/uL (ref 4.0–10.5)

## 2017-05-05 LAB — BASIC METABOLIC PANEL
ANION GAP: 11 (ref 5–15)
BUN: 24 mg/dL — AB (ref 6–20)
BUN: 24 — AB (ref 4–21)
CALCIUM: 8.1 mg/dL — AB (ref 8.9–10.3)
CO2: 27 mmol/L (ref 22–32)
CREATININE: 5.4 — AB (ref 0.6–1.3)
Chloride: 95 mmol/L — ABNORMAL LOW (ref 101–111)
Creatinine, Ser: 5.37 mg/dL — ABNORMAL HIGH (ref 0.61–1.24)
GFR calc Af Amer: 11 mL/min — ABNORMAL LOW (ref >60)
GFR, EST NON AFRICAN AMERICAN: 9 mL/min — AB (ref >60)
GLUCOSE: 73 mg/dL (ref 65–99)
Glucose: 73
POTASSIUM: 4.1 (ref 3.4–5.3)
Potassium: 4.1 mmol/L (ref 3.5–5.1)
Sodium: 133 mmol/L — ABNORMAL LOW (ref 135–145)
Sodium: 133 — AB (ref 137–147)

## 2017-05-05 LAB — GLUCOSE, CAPILLARY
GLUCOSE-CAPILLARY: 201 mg/dL — AB (ref 65–99)
Glucose-Capillary: 67 mg/dL (ref 65–99)
Glucose-Capillary: 106 mg/dL — ABNORMAL HIGH (ref 65–99)
Glucose-Capillary: 163 mg/dL — ABNORMAL HIGH (ref 65–99)
Glucose-Capillary: 172 mg/dL — ABNORMAL HIGH (ref 65–99)

## 2017-05-05 LAB — CBC AND DIFFERENTIAL
HCT: 28 — AB (ref 41–53)
Hemoglobin: 8.4 — AB (ref 13.5–17.5)
Platelets: 404 — AB (ref 150–399)
WBC: 8.7

## 2017-05-05 LAB — PROCALCITONIN: Procalcitonin: 0.29 ng/mL

## 2017-05-05 MED ORDER — ALTEPLASE 2 MG IJ SOLR: 2.0000 mg | Freq: Once | INTRAMUSCULAR | Status: DC | PRN

## 2017-05-05 MED ORDER — SODIUM CHLORIDE 0.9 % IV SOLN: 100.0000 mL | INTRAVENOUS | Status: DC | PRN

## 2017-05-05 MED ORDER — LIDOCAINE HCL (PF) 1 % IJ SOLN: 5.0000 mL | INTRAMUSCULAR | Status: DC | PRN

## 2017-05-05 MED ORDER — LIDOCAINE-PRILOCAINE 2.5-2.5 % EX CREA: 1.0000 "application " | TOPICAL_CREAM | CUTANEOUS | Status: DC | PRN

## 2017-05-05 MED ORDER — HEPARIN SODIUM (PORCINE) 1000 UNIT/ML DIALYSIS: 1000.0000 [IU] | INTRAMUSCULAR | Status: DC | PRN

## 2017-05-05 MED ORDER — PENTAFLUOROPROP-TETRAFLUOROETH EX AERO: 1.0000 "application " | INHALATION_SPRAY | CUTANEOUS | Status: DC | PRN

## 2017-05-05 MED ORDER — VANCOMYCIN HCL IN DEXTROSE 500-5 MG/100ML-% IV SOLN
INTRAVENOUS | Status: AC
  Administered 2017-05-05: 500 mg
  Filled 2017-05-05: qty 100

## 2017-05-05 MED ORDER — ACETAMINOPHEN 325 MG PO TABS
ORAL_TABLET | ORAL | Status: AC
  Filled 2017-05-05: qty 2

## 2017-05-05 MED ORDER — VANCOMYCIN HCL IN DEXTROSE 500-5 MG/100ML-% IV SOLN
INTRAVENOUS | Status: AC
  Filled 2017-05-05: qty 100

## 2017-05-05 NOTE — Evaluation (Signed)
Physical Therapy Evaluation Patient Details Name: [name] [name] MRN: [id number] DOB: 09/20/1941 Today's Date: 05/05/2017   History of Present Illness  Pt is a 76 yo male admitted through the ED on 05/01/17 for worsening infiltrates of right upper lung and L lung and was diagnosed with HCAP. Pt had recently been discharged following bacteremia. Pt underwent a revision of L upper arem fistula, bronchoscopy and bronchoaveolar procedure to L upper and R upper lobes on 05/04/17. 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   Clinical Impression  Pt presents with the above diagnoses and below deficits for therapy evaluation. Prior to admission, pt was staying in a rehab facility following previous hospitalization. Pt requires Max A for all mobility this session including bed mobs and transfers. Pt will benefit from continued acute PT services in order to address the below deficits prior to discharge to venue recommended below. Pt will benefit from returning to SNF at discharge for rehab before returning home.     Follow Up Recommendations SNF;Supervision/Assistance - 24 hour    Equipment Recommendations  None recommended by PT    Recommendations for Other Services       Precautions / Restrictions Precautions Precautions: Fall Restrictions Weight Bearing Restrictions: No      Mobility  Bed Mobility Overal bed mobility: Needs Assistance Bed Mobility: Supine to Sit     Supine to sit: Max assist;HOB elevated     General bed mobility comments: cues to use bed rail to assist. Max A to bring LE's EOB and to sit trunk upright at EOB. Pt requires at least Mod A to maintain balance at EOB.   Transfers Overall transfer level: Needs assistance Equipment used: 1 person hand held assist Transfers: Stand Pivot Transfers   Stand pivot transfers: Max assist;+2 physical assistance       General transfer comment: Max A to power  up and pivot transfer to chair. Pt has a difficult time power up from seated position and requires MAx A to achieve pivot transfer to recliner.   Ambulation/Gait                Stairs            Wheelchair Mobility    Modified Rankin (Stroke Patients Only)       Balance Overall balance assessment: Needs assistance Sitting-balance support: Bilateral upper extremity supported;Feet supported Sitting balance-Leahy Scale: Poor Sitting balance - Comments: at least min assist to keep sitting balance; trunk weakness, tending to lose balance in any direction Postural control: Left lateral lean Standing balance support: Bilateral upper extremity supported Standing balance-Leahy Scale: Zero                               Pertinent Vitals/Pain Pain Assessment: No/denies pain    Home Living Family/patient expects to be discharged to:: Private residence Living Arrangements: Spouse/significant other Available Help at Discharge: Family;Available 24 hours/day;Personal care attendant Type of Home: Apartment Home Access: Stairs to enter Entrance Stairs-Rails: Right Entrance Stairs-Number of Steps: 3 Home Layout: One level Home Equipment: Walker - 4 wheels;Transport chair;Cane - single point;Bedside commode;Shower seat;Wheelchair - manual Additional Comments: reports son, daughter and law and grand daughter live in same building    Prior Function Level of Independence: Needs assistance   Gait / Transfers Assistance Needed: Short distances to bathroom with RW.  30-40'  ADL's / Homemaking Assistance Needed: wife assists with bathing  and dressing        Hand Dominance   Dominant Hand: Right    Extremity/Trunk Assessment   Upper Extremity Assessment Upper Extremity Assessment: Defer to OT evaluation    Lower Extremity Assessment Lower Extremity Assessment: Generalized weakness    Cervical / Trunk Assessment Cervical / Trunk Assessment: Kyphotic   Communication   Communication: HOH  Cognition Arousal/Alertness: Awake/alert Behavior During Therapy: Flat affect Overall Cognitive Status: No family/caregiver present to determine baseline cognitive functioning                                 General Comments: able to follow commands, pleasantly confused.       General Comments      Exercises     Assessment/Plan    PT Assessment Patient needs continued PT services  PT Problem List Decreased strength;Decreased activity tolerance;Decreased balance;Decreased mobility;Decreased cognition;Decreased knowledge of use of DME;Decreased safety awareness       PT Treatment Interventions DME instruction;Gait training;Functional mobility training;Stair training;Therapeutic exercise;Therapeutic activities;Balance training;Cognitive remediation;Patient/family education    PT Goals (Current goals can be found in the Care Plan section)  Acute Rehab PT Goals Patient Stated Goal: to go back home PT Goal Formulation: Patient unable to participate in goal setting Time For Goal Achievement: 05/19/17 Potential to Achieve Goals: Fair    Frequency Min 2X/week   Barriers to discharge        Co-evaluation               AM-PAC PT "6 Clicks" Daily Activity  Outcome Measure Difficulty turning over in bed (including adjusting bedclothes, sheets and blankets)?: Total Difficulty moving from lying on back to sitting on the side of the bed? : Total Difficulty sitting down on and standing up from a chair with arms (e.g., wheelchair, bedside commode, etc,.)?: Total Help needed moving to and from a bed to chair (including a wheelchair)?: Total Help needed walking in hospital room?: Total Help needed climbing 3-5 steps with a railing? : Total 6 Click Score: 6    End of Session Equipment Utilized During Treatment: Gait belt Activity Tolerance: Patient tolerated treatment well;Patient limited by fatigue Patient left: in chair;with  call bell/phone within reach;with chair alarm set Nurse Communication: Mobility status PT Visit Diagnosis: Other abnormalities of gait and mobility (R26.89);Unsteadiness on feet (R26.81);Muscle weakness (generalized) (M62.81);Other symptoms and signs involving the nervous system (R29.898)    Time: 7116-5790 PT Time Calculation (min) (ACUTE ONLY): 22 min   Charges:   PT Evaluation $PT Eval Moderate Complexity: 1 Procedure     PT G Codes:        Scheryl Marten PT, DPT  [phone]   Shanon Rosser 05/05/2017, 10:32 AM

## 2017-05-05 NOTE — Progress Notes (Signed)
Pt gone to dialysis via bed.   

## 2017-05-05 NOTE — Progress Notes (Signed)
Pt CBG re-check 106.

## 2017-05-05 NOTE — Progress Notes (Signed)
PROGRESS NOTE    [name] [name]  PIR:[id number] DOB: January 04, 1941 DOA: 05/01/2017 PCP: Junie Panning, NP     Brief Narrative:  Pope Brunty is a 76 year old male with history of high counts 1, ESRD, coronary disease, type 2 diabetes, hypertension, dyslipidemia, recently admitted for presumed streptococcal bacteremia for which he was treated initially with vancomycin and Zosyn and subsequently transitioned to oral Augmentin and discharged to rehabilitation on 6/7, who presents to the ER today via EMS from his dialysis center with complaints of shortness of breath, nonproductive cough 3-4 days. Patient denies any other symptoms of fever, chills, rigors, sore throat. Chest x-ray as well as CT chest were completed which showed multiple pulmonary cavitary lesions. Patient was started on empiric antibiotics. ID, Pulm have been consulted.   Assessment & Plan:   Active Problems:   Hypercholesterolemia   Heart transplant status ([place])   DM (diabetes mellitus), type 2 with renal complications (HCC)   Hypothyroidism   ESRD on dialysis ([place])   Protein-calorie malnutrition, severe (HCC)   Anemia of chronic disease   Depression   Generalized weakness   HCAP (healthcare-associated pneumonia)   Pressure injury of skin   Cavitary lesion of lung   Bacteremia   Multiple cavitary pulmonary lesions in immunosuppressed  -Recently admitted for presumed strep bacteremia on blood culture from urgent care in [place], culture at [facility] was negative and discharged with Augmentin. TTE 6/6 without vegetation. Initially thought strep but actually it was MRSA (see scanned report from 04/27/2017 Correspondence with [facility])  -S/p left arm fistula revision 6/15  -S/p bronchoscopy 6/15  -Blood cultures NGTD  -ID following, pulm following  -TEE scheduled 6/18  -Vanco   ESRD TThS -Nephrology for dialysis  History of heart transplant 2001, follows at [place] cyclosporine,  CellCept  Type 2 diabetes, well controlled -Sliding-scale insulin -Ha1c 6.3   Hypertension -Continue metoprolol, hydralazine   Dyslipidemia -Continue pravastatin  Hypothyroidism -Continue levothyroxine  Barrett's esophagus -Continue PPI  Depression, dementia -Continue fluoxetine, Aricept    DVT prophylaxis: heparin subq Code Status: full Family Communication: updated wife over phone 6/16  Disposition Plan: pending improvement, work up    Consultants:   ID  Pulm  Nephro   Procedures:  -S/p left arm fistula revision 6/15  -S/p bronchoscopy 6/15   Antimicrobials:  Anti-infectives    Start     Dose/Rate Route Frequency Ordered Stop   05/04/17 1400  cefUROXime (ZINACEF) 1.5 g in dextrose 5 % 50 mL IVPB     1.5 g 100 mL/hr over 30 Minutes Intravenous To Short Stay 05/03/17 0802 05/04/17 1534   05/03/17 1200  vancomycin (VANCOCIN) 500 mg in sodium chloride 0.9 % 100 mL IVPB     500 mg 100 mL/hr over 60 Minutes Intravenous Every T-Th-Sa (Hemodialysis) 05/02/17 1619     05/03/17 0855  vancomycin (VANCOCIN) 500-5 MG/100ML-% IVPB    Comments:  Cherylann Banas   : cabinet override      05/03/17 0855 05/03/17 1006   05/02/17 1800  ceFEPIme (MAXIPIME) 1 g in dextrose 5 % 50 mL IVPB  Status:  Discontinued     1 g 100 mL/hr over 30 Minutes Intravenous Every 24 hours 05/01/17 1743 05/02/17 1150   05/02/17 1300  piperacillin-tazobactam (ZOSYN) IVPB 3.375 g  Status:  Discontinued     3.375 g 12.5 mL/hr over 240 Minutes Intravenous Every 12 hours 05/02/17 1206 05/02/17 1325   05/01/17 1800  vancomycin (VANCOCIN) 1,250 mg in sodium chloride  0.9 % 250 mL IVPB     1,250 mg 166.7 mL/hr over 90 Minutes Intravenous  Once 05/01/17 1727 05/01/17 2101   05/01/17 1715  ceFEPIme (MAXIPIME) 1 g in dextrose 5 % 50 mL IVPB     1 g 100 mL/hr over 30 Minutes Intravenous  Once 05/01/17 1713 05/01/17 1925         Subjective: Patient without any complaints today. Feeling well. He denies  any fevers, chills, chest pain, shortness of breath. Had some dry cough. In good spirits overall.  Objective: Vitals:   05/04/17 1748 05/04/17 2221 05/05/17 0432 05/05/17 0904  BP: (!) 157/51 (!) 155/60 (!) 132/43 (!) 137/41  Pulse: 68 66 70 70  Resp: _0 Temp: 97.9 F (36.6 C) 97.5 F (36.4 C) 98 F (36.7 C) 98 F (36.7 C)  TempSrc: Oral Oral Oral Oral  SpO2: 96% 100% 100% 100%  Weight:  68 kg (149 lb 14.6 oz)    Height:        Intake/Output Summary (Last 24 hours) at 05/05/17 1302 Last data filed at 05/05/17 1200  Gross per 24 hour  Intake              885 ml  Output               10 ml  Net              875 ml   Filed Weights   05/03/17 1059 05/03/17 2107 05/04/17 2221  Weight: 65 kg (143 lb 4.8 oz) 67 kg (147 lb 11.3 oz) 68 kg (149 lb 14.6 oz)    Examination:  General exam: Appears calm and comfortable  Respiratory system: CTAB Cardiovascular system: S1 & S2 heard, RRR. No JVD, murmurs, rubs, gallops or clicks. No pedal edema. Gastrointestinal system: Abdomen is nondistended, soft and nontender. No organomegaly or masses felt. Normal bowel sounds heard. Central nervous system: Alert and oriented. No focal neurological deficits.  Extremities: Symmetric 5 x 5 power. LUE with palpable thrill  Psychiatry: Judgement and insight appear normal. Mood & affect appropriate.   Data Reviewed: I have personally reviewed following labs and imaging studies  CBC:  Recent Labs Lab 05/01/17 1745 05/02/17 0528 05/03/17 0603 05/04/17 0435 05/05/17 0432  WBC 10.9* 8.8 7.7 7.2 8.7  NEUTROABS  --   --  4.7 3.8 5.6  HGB 8.6* 9.4* 8.8* 9.0* 8.4*  HCT 27.2* 30.0* 27.9* 29.3* 27.7*  MCV 95.8 95.8 96.2 99.3 98.2  PLT 389 349 362 403* 993*   Basic Metabolic Panel:  Recent Labs Lab 05/01/17 1642  05/02/17 0528 05/03/17 0603 05/03/17 0713 05/04/17 0435 05/05/17 0432  NA 135  --  134* 136 136 136 133*  K 3.7  --  3.6 4.0 4.0 3.9 4.1  CL 94*  --  95* 96* 96* 95* 95*   CO2 29  --  _1 GLUCOSE 165*  --  104* 103* 122* 86 73  BUN 34*  --  41* 49* 49* 17 24*  CREATININE 4.99*  < > 6.32* 7.52* 7.36* 3.88* 5.37*  CALCIUM 8.2*  --  7.7* 8.0* 7.9* 8.1* 8.1*  MG 2.0  --   --   --   --   --   --   PHOS 3.1  --   --   --  5.2*  --   --   < > = values in this interval not displayed. GFR: Estimated Creatinine  Clearance: 11.3 mL/min (A) (by C-G formula based on SCr of 5.37 mg/dL (H)). Liver Function Tests:  Recent Labs Lab 05/01/17 1642 05/02/17 0528 05/03/17 0713  AST  --  15  --   ALT  --  18  --   ALKPHOS  --  92  --   BILITOT  --  0.7  --   PROT  --  5.4*  --   ALBUMIN 2.3* 2.2* 2.1*   No results for input(s): LIPASE, AMYLASE in the last 168 hours. No results for input(s): AMMONIA in the last 168 hours. Coagulation Profile:  Recent Labs Lab 05/04/17 0435  INR 1.15   Cardiac Enzymes: No results for input(s): CKTOTAL, CKMB, CKMBINDEX, TROPONINI in the last 168 hours. BNP (last 3 results) No results for input(s): PROBNP in the last 8760 hours. HbA1C: No results for input(s): HGBA1C in the last 72 hours. CBG:  Recent Labs Lab 05/04/17 1743 05/04/17 2225 05/05/17 0744 05/05/17 0854 05/05/17 1203  GLUCAP 99 114* 67 106* 201*   Lipid Profile: No results for input(s): CHOL, HDL, LDLCALC, TRIG, CHOLHDL, LDLDIRECT in the last 72 hours. Thyroid Function Tests: No results for input(s): TSH, T4TOTAL, FREET4, T3FREE, THYROIDAB in the last 72 hours. Anemia Panel: No results for input(s): VITAMINB12, FOLATE, FERRITIN, TIBC, IRON, RETICCTPCT in the last 72 hours. Sepsis Labs:  Recent Labs Lab 05/01/17 1745 05/01/17 1812 05/03/17 0603 05/05/17 0432  PROCALCITON 0.45  --  0.35 0.29  LATICACIDVEN  --  1.29  --   --     Recent Results (from the past 240 hour(s))  Blood culture (routine x 2)     Status: None (Preliminary result)   Collection Time: 05/01/17  5:45 PM  Result Value Ref Range Status   Specimen Description BLOOD  RIGHT ANTECUBITAL  Final   Special Requests   Final    BOTTLES DRAWN AEROBIC AND ANAEROBIC Blood Culture adequate volume   Culture NO GROWTH 4 DAYS  Final   Report Status PENDING  Incomplete  Blood culture (routine x 2)     Status: None (Preliminary result)   Collection Time: 05/01/17  6:00 PM  Result Value Ref Range Status   Specimen Description BLOOD RIGHT HAND  Final   Special Requests   Final    BOTTLES DRAWN AEROBIC ONLY Blood Culture adequate volume   Culture NO GROWTH 4 DAYS  Final   Report Status PENDING  Incomplete  Culture, bal-quantitative     Status: None (Preliminary result)   Collection Time: 05/04/17  4:28 PM  Result Value Ref Range Status   Specimen Description BRONCHIAL ALVEOLAR LAVAGE  Final   Special Requests SPEC A  Final   Gram Stain PENDING  Incomplete   Culture CULTURE REINCUBATED FOR BETTER GROWTH  Final   Report Status PENDING  Incomplete  Culture, bal-quantitative     Status: Abnormal (Preliminary result)   Collection Time: 05/04/17  4:44 PM  Result Value Ref Range Status   Specimen Description BRONCHIAL ALVEOLAR LAVAGE  Final   Special Requests SPEC B  Final   Gram Stain PENDING  Incomplete   Culture (A)  Final    30,000 COLONIES/mL STAPHYLOCOCCUS AUREUS SUSCEPTIBILITIES TO FOLLOW    Report Status PENDING  Incomplete       Radiology Studies: No results found.    Scheduled Meds: . aspirin EC  81 mg Oral QHS  . calcium acetate  667 mg Oral TID WC  . Chlorhexidine Gluconate Cloth  6 each Topical Q0600  .  cycloSPORINE modified  75 mg Oral Daily   And  . cycloSPORINE modified  50 mg Oral q1800  . darbepoetin (ARANESP) injection - DIALYSIS  40 mcg Intravenous Q Thu-HD  . donepezil  10 mg Oral QHS  . FLUoxetine  20 mg Oral Daily  . heparin  5,000 Units Subcutaneous Q8H  . hydrALAZINE  25 mg Oral TID  . insulin aspart  0-9 Units Subcutaneous TID WC  . levothyroxine  75 mcg Oral QAC breakfast  . metoprolol tartrate  25 mg Oral BID  . mupirocin  ointment   Nasal BID  . mycophenolate  250 mg Oral Once per day on Sun Mon Wed Fri   And  . mycophenolate  500 mg Oral Once per day on Tue Thu Sat  . mycophenolate  500 mg Oral Daily  . pantoprazole  20 mg Oral Daily  . pravastatin  10 mg Oral QHS  . vitamin B-12  1,000 mcg Oral Q breakfast   Continuous Infusions: . vancomycin       LOS: 4 days    Time spent: 30 minutes   Dessa Phi, DO [facility] [email or web address] Password [email or web address] 05/05/2017, 1:02 PM

## 2017-05-05 NOTE — Progress Notes (Signed)
[facility] Progress Note  Dialysis Orders: [name] [name], Th,S 4 hrs 2.0 K/2.25 Ca 61kg  400/Auto 1.5 -No Heparin  -Venofer 50mg  IV weekly (Last dose 04/12/17 Fe 106 Tsat 45% 04/12/17)   Assess:  1. Bilateral Infiltrates with 4 cm RUL cavitary lesion w/ other multiple nodules some which are cavitary in immunosuppressed pt, small right pleural effusion with loculated fluid: Per primary. BC no growth to date  ID consulted. Possible MRSA Bacteremia/Possible endocarditis/possible fungal infection/neoplasm. ABX narrowed to vanc. Pulmonary and cardiology consulted. Last ECHO 6/6 - no vegetations 2. ESRD -TTS HD 3. Anemia - Hgb 9  Rec'd  Aranesp 40 mcg 6/14. No venofer until [facility] results final  4. Secondary hyperparathyroidism - Ca 7.9 Phos 5.2. cont binders, no VDRA.  5. HTN/volume - EDW lowered to 61 kg post last hospitalization-has not been able to reach in HD center however wt here 61.2 kg bed wt. However pre wt Thursday was 66 kg . Continue metoprolol and hydralazine. Also reportedly taking amlodipine 5 mg at HS -off for now 6. Nutrition - Albumin 2.1 Renal/Carb mod diet, prostat and renal vit - NPO for procedure 7. DM: per primary  8. Ulcerated AVF: Seen by [facility]. For revision of AVF per Dr. Oneida Alar 05/04/17 9. H/O Cardiac transplant: Cont immunosuppressant therapy   P - as above  [name] MD [employer] pgr [phone]   05/05/2017, 1:13 PM        Subjective:   Recognizes me. Walks around the house some- can stand for wt though sometimes has WC weight at dialysis Objective Vitals:   05/04/17 1748 05/04/17 2221 05/05/17 0432 05/05/17 0904  BP: (!) 157/51 (!) 155/60 (!) 132/43 (!) 137/41  Pulse: 68 66 70 70  Resp: 18 18  17   Temp: 97.9 F (36.6 C) 97.5 F (36.4 C) 98 F (36.7 C) 98 F (36.7 C)  TempSrc: Oral Oral Oral Oral  SpO2: 96% 100% 100% 100%  Weight:  68 kg (149 lb 14.6 oz)    Height:       Physical Exam General: pleasant NAD,  thin, elderly chronically ill, comfortably at bed rest Heart: RRR Lungs: dim BS Abdomen: soft NT + BS Extremities:no LE edema Dialysis Access: left AVF + bruit with very high pitched whistling sound distally   Additional Objective Labs: Basic Metabolic Panel:  Recent Labs Lab 05/01/17 1642  05/03/17 0713 05/04/17 0435 05/05/17 0432  NA 135  < > 136 136 133*  K 3.7  < > 4.0 3.9 4.1  CL 94*  < > 96* 95* 95*  CO2 29  < > 26 29 27   GLUCOSE 165*  < > 122* 86 73  BUN 34*  < > 49* 17 24*  CREATININE 4.99*  < > 7.36* 3.88* 5.37*  CALCIUM 8.2*  < > 7.9* 8.1* 8.1*  PHOS 3.1  --  5.2*  --   --   < > = values in this interval not displayed. Liver Function Tests:  Recent Labs Lab 05/01/17 1642 05/02/17 0528 05/03/17 0713  AST  --  15  --   ALT  --  18  --   ALKPHOS  --  92  --   BILITOT  --  0.7  --   PROT  --  5.4*  --   ALBUMIN 2.3* 2.2* 2.1*   No results for input(s): LIPASE, AMYLASE in the last 168 hours. CBC:  Recent Labs Lab 05/01/17 1745 05/02/17 5625 05/03/17 0603 05/04/17 6389  05/05/17 0432  WBC 10.9* 8.8 7.7 7.2 8.7  NEUTROABS  --   --  4.7 3.8 5.6  HGB 8.6* 9.4* 8.8* 9.0* 8.4*  HCT 27.2* 30.0* 27.9* 29.3* 27.7*  MCV 95.8 95.8 96.2 99.3 98.2  PLT 389 349 362 403* 404*   Blood Culture    Component Value Date/Time   SDES BRONCHIAL ALVEOLAR LAVAGE 05/04/2017 1644   SPECREQUEST SPEC B 05/04/2017 1644   CULT (A) 05/04/2017 1644    30,000 COLONIES/mL STAPHYLOCOCCUS AUREUS SUSCEPTIBILITIES TO FOLLOW    REPTSTATUS PENDING 05/04/2017 1644    Cardiac Enzymes: No results for input(s): CKTOTAL, CKMB, CKMBINDEX, TROPONINI in the last 168 hours. CBG:  Recent Labs Lab 05/04/17 1743 05/04/17 2225 05/05/17 0744 05/05/17 0854 05/05/17 1203  GLUCAP 99 114* 67 106* 201*   Iron Studies: No results for input(s): IRON, TIBC, TRANSFERRIN, FERRITIN in the last 72 hours. Lab Results  Component Value Date   INR 1.15 05/04/2017   INR 1.02 02/03/2017   INR  1.19 05/04/2016   Studies/Results: No results found. Medications: . vancomycin     . aspirin EC  81 mg Oral QHS  . calcium acetate  667 mg Oral TID WC  . Chlorhexidine Gluconate Cloth  6 each Topical Q0600  . cycloSPORINE modified  75 mg Oral Daily   And  . cycloSPORINE modified  50 mg Oral q1800  . darbepoetin (ARANESP) injection - DIALYSIS  40 mcg Intravenous Q Thu-HD  . donepezil  10 mg Oral QHS  . FLUoxetine  20 mg Oral Daily  . heparin  5,000 Units Subcutaneous Q8H  . hydrALAZINE  25 mg Oral TID  . insulin aspart  0-9 Units Subcutaneous TID WC  . levothyroxine  75 mcg Oral QAC breakfast  . metoprolol tartrate  25 mg Oral BID  . mupirocin ointment   Nasal BID  . mycophenolate  250 mg Oral Once per day on Sun Mon Wed Fri   And  . mycophenolate  500 mg Oral Once per day on Tue Thu Sat  . mycophenolate  500 mg Oral Daily  . pantoprazole  20 mg Oral Daily  . pravastatin  10 mg Oral QHS  . vitamin B-12  1,000 mcg Oral Q breakfast

## 2017-05-05 NOTE — Progress Notes (Signed)
    [facility] for Infectious Disease   Reason for visit: Follow up on bacteremia, cavitary lesions  Interval History: blood cultures negative.  No new comlaints.  Had BAL yesterday and so far only Staph aureus growing.  No associated rash.    Physical Exam: Constitutional:  Vitals:   05/05/17 1415 05/05/17 1430  BP: (!) 146/115 121/68  Pulse: 70 83  Resp:    Temp:     patient appears in NAD Respiratory: Normal respiratory effort; CTA B Cardiovascular: RRR  GI: soft, nt, nd  Review of Systems: Gastrointestinal: negative for diarrhea Skin: no rashes  Lab Results  Component Value Date   WBC 8.7 05/05/2017   HGB 8.4 (L) 05/05/2017   HCT 27.7 (L) 05/05/2017   MCV 98.2 05/05/2017   PLT 404 (H) 05/05/2017    Lab Results  Component Value Date   CREATININE 5.37 (H) 05/05/2017   BUN 24 (H) 05/05/2017   NA 133 (L) 05/05/2017   K 4.1 05/05/2017   CL 95 (L) 05/05/2017   CO2 27 05/05/2017    Lab Results  Component Value Date   ALT 18 05/02/2017   AST 15 05/02/2017   ALKPHOS 92 05/02/2017     Microbiology: Recent Results (from the past 240 hour(s))  Blood culture (routine x 2)     Status: None (Preliminary result)   Collection Time: 05/01/17  5:45 PM  Result Value Ref Range Status   Specimen Description BLOOD RIGHT ANTECUBITAL  Final   Special Requests   Final    BOTTLES DRAWN AEROBIC AND ANAEROBIC Blood Culture adequate volume   Culture NO GROWTH 4 DAYS  Final   Report Status PENDING  Incomplete  Blood culture (routine x 2)     Status: None (Preliminary result)   Collection Time: 05/01/17  6:00 PM  Result Value Ref Range Status   Specimen Description BLOOD RIGHT HAND  Final   Special Requests   Final    BOTTLES DRAWN AEROBIC ONLY Blood Culture adequate volume   Culture NO GROWTH 4 DAYS  Final   Report Status PENDING  Incomplete  Culture, bal-quantitative     Status: None (Preliminary result)   Collection Time: 05/04/17  4:28 PM  Result Value Ref Range  Status   Specimen Description BRONCHIAL ALVEOLAR LAVAGE  Final   Special Requests SPEC A  Final   Gram Stain PENDING  Incomplete   Culture CULTURE REINCUBATED FOR BETTER GROWTH  Final   Report Status PENDING  Incomplete  Culture, bal-quantitative     Status: Abnormal (Preliminary result)   Collection Time: 05/04/17  4:44 PM  Result Value Ref Range Status   Specimen Description BRONCHIAL ALVEOLAR LAVAGE  Final   Special Requests SPEC B  Final   Gram Stain PENDING  Incomplete   Culture (A)  Final    30,000 COLONIES/mL STAPHYLOCOCCUS AUREUS SUSCEPTIBILITIES TO FOLLOW    Report Status PENDING  Incomplete    Impression/Plan:  1. MRSA bacteremia - on vancomycin with dialysis and  TEE Monday to see if vegetation on valve.  Blood cultures here remain negative. Culture in BAL noted to have Staph aureus.   2.  Cavitary lesions - BAL done and will continue to monitor cultures.    3.  Heart transplant - on immunosuppressive medications.    4.  ESRD - fistula revision yesterday.

## 2017-05-05 NOTE — Progress Notes (Signed)
Pt CBG 67, alert and responsive, asymptomatic, pt eating breakfast will re-check.

## 2017-05-05 NOTE — Progress Notes (Signed)
Pt still on liquid diet, Paged Dr. Maylene Roes may have his renal diet?

## 2017-05-06 LAB — CULTURE, BAL-QUANTITATIVE

## 2017-05-06 LAB — CBC AND DIFFERENTIAL
HEMATOCRIT: 27 — AB (ref 41–53)
HEMOGLOBIN: 8.1 — AB (ref 13.5–17.5)
Platelets: 401 — AB (ref 150–399)
WBC: 7.8

## 2017-05-06 LAB — CULTURE, BLOOD (ROUTINE X 2)
Culture: NO GROWTH
Culture: NO GROWTH
SPECIAL REQUESTS: ADEQUATE
SPECIAL REQUESTS: ADEQUATE

## 2017-05-06 LAB — BASIC METABOLIC PANEL
Anion gap: 9 (ref 5–15)
BUN: 15 (ref 4–21)
BUN: 15 mg/dL (ref 6–20)
CALCIUM: 7.9 mg/dL — AB (ref 8.9–10.3)
CO2: 30 mmol/L (ref 22–32)
CREATININE: 3.56 mg/dL — AB (ref 0.61–1.24)
Chloride: 97 mmol/L — ABNORMAL LOW (ref 101–111)
Creatinine: 3.6 — AB (ref 0.6–1.3)
GFR calc non Af Amer: 15 mL/min — ABNORMAL LOW (ref >60)
GFR, EST AFRICAN AMERICAN: 18 mL/min — AB (ref >60)
Glucose, Bld: 134 mg/dL — ABNORMAL HIGH (ref 65–99)
Glucose: 134
POTASSIUM: 3.6 (ref 3.4–5.3)
Potassium: 3.6 mmol/L (ref 3.5–5.1)
SODIUM: 136 — AB (ref 137–147)
Sodium: 136 mmol/L (ref 135–145)

## 2017-05-06 LAB — CBC WITH DIFFERENTIAL/PLATELET
BASOS ABS: 0.1 10*3/uL (ref 0.0–0.1)
Basophils Relative: 1 %
EOS PCT: 1 %
Eosinophils Absolute: 0.1 10*3/uL (ref 0.0–0.7)
HCT: 26.8 % — ABNORMAL LOW (ref 39.0–52.0)
HEMOGLOBIN: 8.1 g/dL — AB (ref 13.0–17.0)
LYMPHS PCT: 22 %
Lymphs Abs: 1.7 10*3/uL (ref 0.7–4.0)
MCH: 29.7 pg (ref 26.0–34.0)
MCHC: 30.2 g/dL (ref 30.0–36.0)
MCV: 98.2 fL (ref 78.0–100.0)
Monocytes Absolute: 0.6 10*3/uL (ref 0.1–1.0)
Monocytes Relative: 8 %
NEUTROS PCT: 68 %
Neutro Abs: 5.3 10*3/uL (ref 1.7–7.7)
Platelets: 401 10*3/uL — ABNORMAL HIGH (ref 150–400)
RBC: 2.73 MIL/uL — ABNORMAL LOW (ref 4.22–5.81)
RDW: 14.9 % (ref 11.5–15.5)
WBC: 7.8 10*3/uL (ref 4.0–10.5)

## 2017-05-06 LAB — GLUCOSE, CAPILLARY
Glucose-Capillary: 120 mg/dL — ABNORMAL HIGH (ref 65–99)
Glucose-Capillary: 126 mg/dL — ABNORMAL HIGH (ref 65–99)
Glucose-Capillary: 115 mg/dL — ABNORMAL HIGH (ref 65–99)
Glucose-Capillary: 147 mg/dL — ABNORMAL HIGH (ref 65–99)

## 2017-05-06 LAB — VANCOMYCIN, RANDOM: Vancomycin Rm: 19

## 2017-05-06 NOTE — Progress Notes (Signed)
[facility] Progress Note  Dialysis Orders: [facility] T, Th,S 4 hrs 2.0 K/2.25 Ca 61kg  400/Auto 1.5 -No Heparin  -Venofer 67m IV weekly (Last dose 04/12/17 Fe 106 Tsat 45% 04/12/17)   Assess:  1. Cavitary lung disease - undergoing w/u per pulm, sp BAL, growth so far +MRSA. ECHO 6/6 - no vegetations 2. ESRD -TTS HD 3. Anemia - Hgb 9  Rec'd  Aranesp 40 mcg 6/14. [facility]  4. Secondary hyperparathyroidism - Ca 7.9 Phos 5.2. cont binders, no VDRA.  5. HTN/volume - BP's good on metop/ hydralazine. Under dry wt 2kg.  6. Nutrition - Albumin 2.1 Renal/Carb mod diet, prostat and renal vit - NPO for procedure 7. DM: per primary  8. Ulcerated AVF: S/P revision of AVF 6/15, cont to use aVF 9. H/O Cardiac transplant: Cont immunosuppressant therapy   P - as above  RKelly SplinterMD CNewell Rubbermaidpgr ([phone]  05/06/2017, 12:41 PM        Subjective:   Recognizes me. Walks around the house some- can stand for wt though sometimes has WC weight at dialysis Objective Vitals:   05/05/17 1801 05/05/17 2213 05/06/17 0500 05/06/17 0944  BP: (!) 122/52 (!) 143/34 (!) 144/61 (!) 142/51  Pulse: 87 87 75 74  Resp: _0 Temp: 98.2 F (36.8 C) 98.6 F (37 C) 98.6 F (37 C) 97.8 F (36.6 C)  TempSrc: Oral Oral Oral Oral  SpO2: 99% 100% 100% 100%  Weight:   59 kg (130 lb 1.1 oz)   Height:       Physical Exam General: pleasant NAD, thin, elderly chronically ill, comfortably at bed rest Heart: RRR Lungs: dim BS Abdomen: soft NT + BS Extremities:no LE edema Dialysis Access: left AVF + bruit   Additional Objective Labs: Basic Metabolic Panel:  Recent Labs Lab 05/01/17 1642  05/03/17 0713 05/04/17 0435 05/05/17 0432 05/06/17 0545  NA 135  < > 136 136 133* 136  K 3.7  < > 4.0 3.9 4.1 3.6  CL 94*  < > 96* 95* 95* 97*  CO2 29  < > _1 GLUCOSE 165*  < > 122* 86 73 134*  BUN 34*  < > 49* 17 24* 15  CREATININE  4.99*  < > 7.36* 3.88* 5.37* 3.56*  CALCIUM 8.2*  < > 7.9* 8.1* 8.1* 7.9*  PHOS 3.1  --  5.2*  --   --   --   < > = values in this interval not displayed. Liver Function Tests:  Recent Labs Lab 05/01/17 1642 05/02/17 0528 05/03/17 0713  AST  --  15  --   ALT  --  18  --   ALKPHOS  --  92  --   BILITOT  --  0.7  --   PROT  --  5.4*  --   ALBUMIN 2.3* 2.2* 2.1*   No results for input(s): LIPASE, AMYLASE in the last 168 hours. CBC:  Recent Labs Lab 05/02/17 0528  05/03/17 0603 05/04/17 0435 05/05/17 0432 05/06/17 0545  WBC 8.8  --  7.7 7.2 8.7 7.8  NEUTROABS  --   < > 4.7 3.8 5.6 5.3  HGB 9.4*  --  8.8* 9.0* 8.4* 8.1*  HCT 30.0*  --  27.9* 29.3* 27.7* 26.8*  MCV 95.8  --  96.2 99.3 98.2 98.2  PLT 349  --  362 403* 404* 401*  < > =  values in this interval not displayed. Blood Culture    Component Value Date/Time   SDES BRONCHIAL ALVEOLAR LAVAGE 05/04/2017 1644   SPECREQUEST SPEC B 05/04/2017 1644   CULT (A) 05/04/2017 1644    30,000 COLONIES/mL METHICILLIN RESISTANT STAPHYLOCOCCUS AUREUS   REPTSTATUS 05/06/2017 FINAL 05/04/2017 1644    Cardiac Enzymes: No results for input(s): CKTOTAL, CKMB, CKMBINDEX, TROPONINI in the last 168 hours. CBG:  Recent Labs Lab 05/05/17 1203 05/05/17 1758 05/05/17 2225 05/06/17 0747 05/06/17 1233  GLUCAP 201* 163* 172* 120* 126*   Iron Studies: No results for input(s): IRON, TIBC, TRANSFERRIN, FERRITIN in the last 72 hours. Lab Results  Component Value Date   INR 1.15 05/04/2017   INR 1.02 02/03/2017   INR 1.19 05/04/2016   Studies/Results: No results found. Medications: . vancomycin Stopped (05/05/17 1719)   . aspirin EC  81 mg Oral QHS  . calcium acetate  667 mg Oral TID WC  . Chlorhexidine Gluconate Cloth  6 each Topical Q0600  . cycloSPORINE modified  75 mg Oral Daily   And  . cycloSPORINE modified  50 mg Oral q1800  . darbepoetin (ARANESP) injection - DIALYSIS  40 mcg Intravenous Q Thu-HD  . donepezil  10 mg  Oral QHS  . FLUoxetine  20 mg Oral Daily  . heparin  5,000 Units Subcutaneous Q8H  . hydrALAZINE  25 mg Oral TID  . insulin aspart  0-9 Units Subcutaneous TID WC  . levothyroxine  75 mcg Oral QAC breakfast  . metoprolol tartrate  25 mg Oral BID  . mupirocin ointment   Nasal BID  . mycophenolate  250 mg Oral Once per day on Sun Mon Wed Fri   And  . mycophenolate  500 mg Oral Once per day on Tue Thu Sat  . mycophenolate  500 mg Oral Daily  . pantoprazole  20 mg Oral Daily  . pravastatin  10 mg Oral QHS  . vitamin B-12  1,000 mcg Oral Q breakfast

## 2017-05-06 NOTE — Progress Notes (Signed)
[facility] for Infectious Disease   Reason for visit: Follow up on bacteremia, cavitary lesions  Interval History: blood cultures remain negative.  No new complaints. Positive MRSA in the BAL, no other positive cultures.  No associated rash, diarrhea.    Physical Exam: Constitutional:  Vitals:   05/06/17 0500 05/06/17 0944  BP: (!) 144/61 (!) 142/51  Pulse: 75 74  Resp:  18  Temp: 98.6 F (37 C) 97.8 F (36.6 C)   patient appears in NAD, sleeping, arousable Respiratory: Normal respiratory effort; CTA B Cardiovascular: RRR  GI: soft, nt, nd  Review of Systems: Gastrointestinal: negative for nausea Skin: no rashes  Lab Results  Component Value Date   WBC 7.8 05/06/2017   HGB 8.1 (L) 05/06/2017   HCT 26.8 (L) 05/06/2017   MCV 98.2 05/06/2017   PLT 401 (H) 05/06/2017    Lab Results  Component Value Date   CREATININE 3.56 (H) 05/06/2017   BUN 15 05/06/2017   NA 136 05/06/2017   K 3.6 05/06/2017   CL 97 (L) 05/06/2017   CO2 30 05/06/2017    Lab Results  Component Value Date   ALT 18 05/02/2017   AST 15 05/02/2017   ALKPHOS 92 05/02/2017     Microbiology: Recent Results (from the past 240 hour(s))  Blood culture (routine x 2)     Status: None   Collection Time: 05/01/17  5:45 PM  Result Value Ref Range Status   Specimen Description BLOOD RIGHT ANTECUBITAL  Final   Special Requests   Final    BOTTLES DRAWN AEROBIC AND ANAEROBIC Blood Culture adequate volume   Culture NO GROWTH 5 DAYS  Final   Report Status 05/06/2017 FINAL  Final  Blood culture (routine x 2)     Status: None   Collection Time: 05/01/17  6:00 PM  Result Value Ref Range Status   Specimen Description BLOOD RIGHT HAND  Final   Special Requests   Final    BOTTLES DRAWN AEROBIC ONLY Blood Culture adequate volume   Culture NO GROWTH 5 DAYS  Final   Report Status 05/06/2017 FINAL  Final  Culture, bal-quantitative     Status: Abnormal (Preliminary result)   Collection Time: 05/04/17  4:28  PM  Result Value Ref Range Status   Specimen Description BRONCHIAL ALVEOLAR LAVAGE  Final   Special Requests SPEC A  Final   Culture (A)  Final    5,000 COLONIES/mL STAPHYLOCOCCUS AUREUS SUSCEPTIBILITIES TO FOLLOW    Report Status PENDING  Incomplete  Culture, bal-quantitative     Status: Abnormal   Collection Time: 05/04/17  4:44 PM  Result Value Ref Range Status   Specimen Description BRONCHIAL ALVEOLAR LAVAGE  Final   Special Requests SPEC B  Final   Culture (A)  Final    30,000 COLONIES/mL METHICILLIN RESISTANT STAPHYLOCOCCUS AUREUS   Report Status 05/06/2017 FINAL  Final   Organism ID, Bacteria METHICILLIN RESISTANT STAPHYLOCOCCUS AUREUS (A)  Final      Susceptibility   Methicillin resistant staphylococcus aureus - MIC*    CIPROFLOXACIN >=8 RESISTANT Resistant     ERYTHROMYCIN >=8 RESISTANT Resistant     GENTAMICIN <=0.5 SENSITIVE Sensitive     OXACILLIN >=4 RESISTANT Resistant     TETRACYCLINE >=16 RESISTANT Resistant     VANCOMYCIN <=0.5 SENSITIVE Sensitive     TRIMETH/SULFA <=10 SENSITIVE Sensitive     CLINDAMYCIN RESISTANT Resistant     RIFAMPIN <=0.5 SENSITIVE Sensitive     Inducible Clindamycin POSITIVE Resistant     *  30,000 COLONIES/mL METHICILLIN RESISTANT STAPHYLOCOCCUS AUREUS    Impression/Plan:  1. MRSA bacteremia - on vancomycin with dialysis and  TEE tomorrow to see if vegetation on valve.   Culture in BAL noted to have Staph aureus, no other positive results to date.    2.  Cavitary lesions - continuing to monitor cultures  3.  Heart transplant - on immunosuppressive medications.    4.  ESRD - fistula revision done.    Dr. Baxter Flattery on service tomorrow

## 2017-05-06 NOTE — Progress Notes (Signed)
Pharmacy Antibiotic Note [name] [name] is a 76 y.o. male admitted on 05/01/2017 with MRSA bacteremia.  Patient with previously reported strep bacteremia from outside hospital in [place]. Upon CCM review of records, noted to actually be MRSA bacteremia. Pt currently on vancomycin with IHD and vancomycin random this morning appropriate. TEE planned for Monday.   Plan: - Vancomycin 500 mg IV qTTS (HD) - Monitor renal function, culture data, and vancomycin levels as indicated - F/u length of therapy, TEE results   Height: _0  (177.8 cm) Weight: 130 lb 1.1 oz (59 kg) IBW/kg (Calculated) : 73  Temp (24hrs), Avg:98.3 F (36.8 C), Min:97.8 F (36.6 C), Max:98.6 F (37 C)   Recent Labs Lab 05/01/17 1812 05/02/17 0528 05/03/17 0603 05/03/17 0713 05/04/17 0435 05/05/17 0432 05/06/17 0545  WBC  --  8.8 7.7  --  7.2 8.7 7.8  CREATININE  --  6.32* 7.52* 7.36* 3.88* 5.37* 3.56*  LATICACIDVEN 1.29  --   --   --   --   --   --   VANCORANDOM  --   --   --   --   --   --  19    Estimated Creatinine Clearance: 14.7 mL/min (A) (by C-G formula based on SCr of 3.56 mg/dL (H)).    Allergies  Allergen Reactions  . Norvasc [Amlodipine Besylate] Swelling and Other (See Comments)    Swelling of the lower body     Antimicrobials this admission: 6/12 Vancomycin >> -6/17 VR: 19 mcg/mL (pre-HD) 6/12 Cefepime x1 Zosyn 6/13 >>6/14  Microbiology results: 6/12 BCx: ngtd, final  6/15 BAL viral: pending 6/15 BAL spec A: 5000 colonies staph aureus (no susc) 6/15 BAL spec B: 30,000 MRSA, S-vanc (MIC <0.5)  Argie Ramming, PharmD Pharmacy Resident  Pager [phone] 05/06/17 2:48 PM

## 2017-05-06 NOTE — Progress Notes (Signed)
PROGRESS NOTE    [name] [name]  ZOX:[id number] DOB: 12/10/1940 DOA: 05/01/2017 PCP: Junie Panning, NP     Brief Narrative:  Ranell Skibinski is a 76 year old male with history of high counts 1, ESRD, coronary disease, type 2 diabetes, hypertension, dyslipidemia, recently admitted for presumed streptococcal bacteremia for which he was treated initially with vancomycin and Zosyn and subsequently transitioned to oral Augmentin and discharged to rehabilitation on 6/7, who presents to the ER via EMS from his dialysis center with complaints of shortness of breath, nonproductive cough 3-4 days. Patient denies any other symptoms of fever, chills, rigors, sore throat. Chest x-ray as well as CT chest were completed which showed multiple pulmonary cavitary lesions. Patient was started on empiric antibiotics. ID, Pulm have been consulted.   Assessment & Plan:   Active Problems:   Hypercholesterolemia   Heart transplant status ([place])   DM (diabetes mellitus), type 2 with renal complications (HCC)   Hypothyroidism   ESRD on dialysis ([place])   Protein-calorie malnutrition, severe (HCC)   Anemia of chronic disease   Depression   Generalized weakness   HCAP (healthcare-associated pneumonia)   Pressure injury of skin   Cavitary lesion of lung   Bacteremia   Multiple cavitary pulmonary lesions in immunosuppressed  -Recently admitted for presumed strep bacteremia on blood culture from urgent care in [place], culture at [facility] was negative and discharged with Augmentin. TTE 6/6 without vegetation. Initially thought strep but actually it was MRSA (see scanned report from 04/27/2017 Correspondence with [facility])  -S/p left arm fistula revision 6/15  -S/p bronchoscopy 6/15, BAL showing staph aureus -Blood cultures NGTD  -ID following, pulm following  -TEE scheduled 6/18  -Vanco   ESRD TThS -Nephrology for dialysis  History of heart transplant 2001, follows at [place]  cyclosporine, CellCept  Type 2 diabetes, well controlled -Sliding-scale insulin -Ha1c 6.3   Hypertension -Continue metoprolol, hydralazine   Dyslipidemia -Continue pravastatin  Hypothyroidism -Continue levothyroxine  Barrett's esophagus -Continue PPI  Depression, dementia -Continue fluoxetine, Aricept    DVT prophylaxis: heparin subq Code Status: full Family Communication: updated wife over phone 6/16  Disposition Plan: pending improvement, work up    Consultants:   ID  Pulm  Nephro   Procedures:  -S/p left arm fistula revision 6/15  -S/p bronchoscopy 6/15   Antimicrobials:  Anti-infectives    Start     Dose/Rate Route Frequency Ordered Stop   05/05/17 1618  vancomycin (VANCOCIN) 500-5 MG/100ML-% IVPB    Comments:  Ashley Akin   : cabinet override      05/05/17 1618 05/05/17 1623   05/05/17 1530  vancomycin (VANCOCIN) 500-5 MG/100ML-% IVPB    Comments:  Ashley Akin   : cabinet override      05/05/17 1530 05/05/17 1615   05/04/17 1400  cefUROXime (ZINACEF) 1.5 g in dextrose 5 % 50 mL IVPB     1.5 g 100 mL/hr over 30 Minutes Intravenous To Short Stay 05/03/17 0802 05/04/17 1534   05/03/17 1200  vancomycin (VANCOCIN) 500 mg in sodium chloride 0.9 % 100 mL IVPB     500 mg 100 mL/hr over 60 Minutes Intravenous Every T-Th-Sa (Hemodialysis) 05/02/17 1619     05/03/17 0855  vancomycin (VANCOCIN) 500-5 MG/100ML-% IVPB    Comments:  Cherylann Banas   : cabinet override      05/03/17 0855 05/03/17 1006   05/02/17 1800  ceFEPIme (MAXIPIME) 1 g in dextrose 5 % 50 mL IVPB  Status:  Discontinued  1 g 100 mL/hr over 30 Minutes Intravenous Every 24 hours 05/01/17 1743 05/02/17 1150   05/02/17 1300  piperacillin-tazobactam (ZOSYN) IVPB 3.375 g  Status:  Discontinued     3.375 g 12.5 mL/hr over 240 Minutes Intravenous Every 12 hours 05/02/17 1206 05/02/17 1325   05/01/17 1800  vancomycin (VANCOCIN) 1,250 mg in sodium chloride 0.9 % 250 mL IVPB     1,250 mg 166.7  mL/hr over 90 Minutes Intravenous  Once 05/01/17 1727 05/01/17 2101   05/01/17 1715  ceFEPIme (MAXIPIME) 1 g in dextrose 5 % 50 mL IVPB     1 g 100 mL/hr over 30 Minutes Intravenous  Once 05/01/17 1713 05/01/17 1925         Subjective: Patient without any complaints today. Feeling well. He denies any fevers, chills, chest pain, shortness of breath. Had some dry cough. In good spirits overall.  Objective: Vitals:   05/05/17 1801 05/05/17 2213 05/06/17 0500 05/06/17 0944  BP: (!) 122/52 (!) 143/34 (!) 144/61 (!) 142/51  Pulse: 87 87 75 74  Resp: _0 Temp: 98.2 F (36.8 C) 98.6 F (37 C) 98.6 F (37 C) 97.8 F (36.6 C)  TempSrc: Oral Oral Oral Oral  SpO2: 99% 100% 100% 100%  Weight:   59 kg (130 lb 1.1 oz)   Height:        Intake/Output Summary (Last 24 hours) at 05/06/17 1036 Last data filed at 05/06/17 0900  Gross per 24 hour  Intake              460 ml  Output             1000 ml  Net             -540 ml   Filed Weights   05/05/17 1345 05/05/17 1719 05/06/17 0500  Weight: 66 kg (145 lb 8.1 oz) 65 kg (143 lb 4.8 oz) 59 kg (130 lb 1.1 oz)    Examination:  General exam: Appears calm and comfortable  Respiratory system: CTAB Cardiovascular system: S1 & S2 heard, RRR. No JVD, murmurs, rubs, gallops or clicks. No pedal edema. Gastrointestinal system: Abdomen is nondistended, soft and nontender. No organomegaly or masses felt. Normal bowel sounds heard. Central nervous system: Alert and oriented. No focal neurological deficits.  Extremities: Symmetric 5 x 5 power. LUE with palpable thrill  Psychiatry: Judgement and insight appear normal. Mood & affect appropriate.   Data Reviewed: I have personally reviewed following labs and imaging studies  CBC:  Recent Labs Lab 05/02/17 0528 05/03/17 0603 05/04/17 0435 05/05/17 0432 05/06/17 0545  WBC 8.8 7.7 7.2 8.7 7.8  NEUTROABS  --  4.7 3.8 5.6 5.3  HGB 9.4* 8.8* 9.0* 8.4* 8.1*  HCT 30.0* 27.9* 29.3* 27.7* 26.8*    MCV 95.8 96.2 99.3 98.2 98.2  PLT 349 362 403* 404* 599*   Basic Metabolic Panel:  Recent Labs Lab 05/01/17 1642  05/03/17 0603 05/03/17 0713 05/04/17 0435 05/05/17 0432 05/06/17 0545  NA 135  < > 136 136 136 133* 136  K 3.7  < > 4.0 4.0 3.9 4.1 3.6  CL 94*  < > 96* 96* 95* 95* 97*  CO2 29  < > _1 GLUCOSE 165*  < > 103* 122* 86 73 134*  BUN 34*  < > 49* 49* 17 24* 15  CREATININE 4.99*  < > 7.52* 7.36* 3.88* 5.37* 3.56*  CALCIUM 8.2*  < > 8.0* 7.9*  8.1* 8.1* 7.9*  MG 2.0  --   --   --   --   --   --   PHOS 3.1  --   --  5.2*  --   --   --   < > = values in this interval not displayed. GFR: Estimated Creatinine Clearance: 14.7 mL/min (A) (by C-G formula based on SCr of 3.56 mg/dL (H)). Liver Function Tests:  Recent Labs Lab 05/01/17 1642 05/02/17 0528 05/03/17 0713  AST  --  15  --   ALT  --  18  --   ALKPHOS  --  92  --   BILITOT  --  0.7  --   PROT  --  5.4*  --   ALBUMIN 2.3* 2.2* 2.1*   No results for input(s): LIPASE, AMYLASE in the last 168 hours. No results for input(s): AMMONIA in the last 168 hours. Coagulation Profile:  Recent Labs Lab 05/04/17 0435  INR 1.15   Cardiac Enzymes: No results for input(s): CKTOTAL, CKMB, CKMBINDEX, TROPONINI in the last 168 hours. BNP (last 3 results) No results for input(s): PROBNP in the last 8760 hours. HbA1C: No results for input(s): HGBA1C in the last 72 hours. CBG:  Recent Labs Lab 05/05/17 0854 05/05/17 1203 05/05/17 1758 05/05/17 2225 05/06/17 0747  GLUCAP 106* 201* 163* 172* 120*   Lipid Profile: No results for input(s): CHOL, HDL, LDLCALC, TRIG, CHOLHDL, LDLDIRECT in the last 72 hours. Thyroid Function Tests: No results for input(s): TSH, T4TOTAL, FREET4, T3FREE, THYROIDAB in the last 72 hours. Anemia Panel: No results for input(s): VITAMINB12, FOLATE, FERRITIN, TIBC, IRON, RETICCTPCT in the last 72 hours. Sepsis Labs:  Recent Labs Lab 05/01/17 1745 05/01/17 1812 05/03/17 0603  05/05/17 0432  PROCALCITON 0.45  --  0.35 0.29  LATICACIDVEN  --  1.29  --   --     Recent Results (from the past 240 hour(s))  Blood culture (routine x 2)     Status: None   Collection Time: 05/01/17  5:45 PM  Result Value Ref Range Status   Specimen Description BLOOD RIGHT ANTECUBITAL  Final   Special Requests   Final    BOTTLES DRAWN AEROBIC AND ANAEROBIC Blood Culture adequate volume   Culture NO GROWTH 5 DAYS  Final   Report Status 05/06/2017 FINAL  Final  Blood culture (routine x 2)     Status: None   Collection Time: 05/01/17  6:00 PM  Result Value Ref Range Status   Specimen Description BLOOD RIGHT HAND  Final   Special Requests   Final    BOTTLES DRAWN AEROBIC ONLY Blood Culture adequate volume   Culture NO GROWTH 5 DAYS  Final   Report Status 05/06/2017 FINAL  Final  Culture, bal-quantitative     Status: Abnormal (Preliminary result)   Collection Time: 05/04/17  4:28 PM  Result Value Ref Range Status   Specimen Description BRONCHIAL ALVEOLAR LAVAGE  Final   Special Requests SPEC A  Final   Culture (A)  Final    5,000 COLONIES/mL STAPHYLOCOCCUS AUREUS SUSCEPTIBILITIES TO FOLLOW    Report Status PENDING  Incomplete  Culture, bal-quantitative     Status: Abnormal   Collection Time: 05/04/17  4:44 PM  Result Value Ref Range Status   Specimen Description BRONCHIAL ALVEOLAR LAVAGE  Final   Special Requests SPEC B  Final   Culture (A)  Final    30,000 COLONIES/mL METHICILLIN RESISTANT STAPHYLOCOCCUS AUREUS   Report Status 05/06/2017 FINAL  Final  Organism ID, Bacteria METHICILLIN RESISTANT STAPHYLOCOCCUS AUREUS (A)  Final      Susceptibility   Methicillin resistant staphylococcus aureus - MIC*    CIPROFLOXACIN >=8 RESISTANT Resistant     ERYTHROMYCIN >=8 RESISTANT Resistant     GENTAMICIN <=0.5 SENSITIVE Sensitive     OXACILLIN >=4 RESISTANT Resistant     TETRACYCLINE >=16 RESISTANT Resistant     VANCOMYCIN <=0.5 SENSITIVE Sensitive     TRIMETH/SULFA <=10  SENSITIVE Sensitive     CLINDAMYCIN RESISTANT Resistant     RIFAMPIN <=0.5 SENSITIVE Sensitive     Inducible Clindamycin POSITIVE Resistant     * 30,000 COLONIES/mL METHICILLIN RESISTANT STAPHYLOCOCCUS AUREUS       Radiology Studies: No results found.    Scheduled Meds: . aspirin EC  81 mg Oral QHS  . calcium acetate  667 mg Oral TID WC  . Chlorhexidine Gluconate Cloth  6 each Topical Q0600  . cycloSPORINE modified  75 mg Oral Daily   And  . cycloSPORINE modified  50 mg Oral q1800  . darbepoetin (ARANESP) injection - DIALYSIS  40 mcg Intravenous Q Thu-HD  . donepezil  10 mg Oral QHS  . FLUoxetine  20 mg Oral Daily  . heparin  5,000 Units Subcutaneous Q8H  . hydrALAZINE  25 mg Oral TID  . insulin aspart  0-9 Units Subcutaneous TID WC  . levothyroxine  75 mcg Oral QAC breakfast  . metoprolol tartrate  25 mg Oral BID  . mupirocin ointment   Nasal BID  . mycophenolate  250 mg Oral Once per day on Sun Mon Wed Fri   And  . mycophenolate  500 mg Oral Once per day on Tue Thu Sat  . mycophenolate  500 mg Oral Daily  . pantoprazole  20 mg Oral Daily  . pravastatin  10 mg Oral QHS  . vitamin B-12  1,000 mcg Oral Q breakfast   Continuous Infusions: . vancomycin Stopped (05/05/17 1719)     LOS: 5 days    Time spent: 30 minutes   Dessa Phi, DO [facility] [email or web address] Password [facility] 05/06/2017, 10:36 AM

## 2017-05-07 ENCOUNTER — Encounter (HOSPITAL_COMMUNITY)
Admission: EM | Disposition: A | Discharge status: Skilled Nursing Facility | Payer: Self-pay | Source: Home / Self Care | Attending: Internal Medicine

## 2017-05-07 ENCOUNTER — Inpatient Hospital Stay (HOSPITAL_COMMUNITY): Payer: Medicare HMO

## 2017-05-07 ENCOUNTER — Telehealth: Payer: Self-pay | Admitting: Vascular Surgery

## 2017-05-07 ENCOUNTER — Encounter (HOSPITAL_COMMUNITY): Payer: Self-pay | Admitting: *Deleted

## 2017-05-07 DIAGNOSIS — E1165 Type 2 diabetes mellitus with hyperglycemia: Secondary | ICD-10-CM

## 2017-05-07 DIAGNOSIS — F0390 Unspecified dementia without behavioral disturbance: Secondary | ICD-10-CM

## 2017-05-07 DIAGNOSIS — R7881 Bacteremia: Secondary | ICD-10-CM

## 2017-05-07 DIAGNOSIS — N186 End stage renal disease: Secondary | ICD-10-CM

## 2017-05-07 DIAGNOSIS — Z992 Dependence on renal dialysis: Secondary | ICD-10-CM

## 2017-05-07 DIAGNOSIS — I2510 Atherosclerotic heart disease of native coronary artery without angina pectoris: Secondary | ICD-10-CM

## 2017-05-07 DIAGNOSIS — K22719 Barrett's esophagus with dysplasia, unspecified: Secondary | ICD-10-CM

## 2017-05-07 DIAGNOSIS — Z941 Heart transplant status: Secondary | ICD-10-CM

## 2017-05-07 DIAGNOSIS — J15212 Pneumonia due to Methicillin resistant Staphylococcus aureus: Principal | ICD-10-CM

## 2017-05-07 HISTORY — PX: TEE WITHOUT CARDIOVERSION: SHX5443

## 2017-05-07 LAB — CBC AND DIFFERENTIAL
HCT: 26 — AB (ref 41–53)
HEMOGLOBIN: 8.1 — AB (ref 13.5–17.5)
Platelets: 459 — AB (ref 150–399)
WBC: 8

## 2017-05-07 LAB — CBC WITH DIFFERENTIAL/PLATELET
BASOS ABS: 0.1 10*3/uL (ref 0.0–0.1)
BASOS PCT: 1 %
EOS PCT: 1 %
Eosinophils Absolute: 0.1 10*3/uL (ref 0.0–0.7)
HEMATOCRIT: 26 % — AB (ref 39.0–52.0)
Hemoglobin: 8.1 g/dL — ABNORMAL LOW (ref 13.0–17.0)
Lymphocytes Relative: 29 %
Lymphs Abs: 2.3 10*3/uL (ref 0.7–4.0)
MCH: 30.5 pg (ref 26.0–34.0)
MCHC: 31.2 g/dL (ref 30.0–36.0)
MCV: 97.7 fL (ref 78.0–100.0)
MONO ABS: 0.6 10*3/uL (ref 0.1–1.0)
Monocytes Relative: 7 %
NEUTROS ABS: 5 10*3/uL (ref 1.7–7.7)
Neutrophils Relative %: 62 %
Platelets: 459 10*3/uL — ABNORMAL HIGH (ref 150–400)
RBC: 2.66 MIL/uL — ABNORMAL LOW (ref 4.22–5.81)
RDW: 15.1 % (ref 11.5–15.5)
WBC: 8 10*3/uL (ref 4.0–10.5)

## 2017-05-07 LAB — CULTURE, BAL-QUANTITATIVE W GRAM STAIN: Culture: 5000 — AB

## 2017-05-07 LAB — BASIC METABOLIC PANEL
ANION GAP: 11 (ref 5–15)
BUN: 24 mg/dL — ABNORMAL HIGH (ref 6–20)
BUN: 24 — AB (ref 4–21)
CALCIUM: 8.1 mg/dL — AB (ref 8.9–10.3)
CO2: 28 mmol/L (ref 22–32)
Chloride: 95 mmol/L — ABNORMAL LOW (ref 101–111)
Creatinine, Ser: 4.91 mg/dL — ABNORMAL HIGH (ref 0.61–1.24)
Creatinine: 4.9 — AB (ref 0.6–1.3)
GFR, EST AFRICAN AMERICAN: 12 mL/min — AB (ref >60)
GFR, EST NON AFRICAN AMERICAN: 10 mL/min — AB (ref >60)
Glucose, Bld: 97 mg/dL (ref 65–99)
Glucose: 97
POTASSIUM: 5 (ref 3.4–5.3)
Potassium: 5 mmol/L (ref 3.5–5.1)
SODIUM: 134 mmol/L — AB (ref 135–145)
Sodium: 134 — AB (ref 137–147)

## 2017-05-07 LAB — GLUCOSE, CAPILLARY
GLUCOSE-CAPILLARY: 90 mg/dL (ref 65–99)
GLUCOSE-CAPILLARY: 170 mg/dL — AB (ref 65–99)
Glucose-Capillary: 127 mg/dL — ABNORMAL HIGH (ref 65–99)
Glucose-Capillary: 103 mg/dL — ABNORMAL HIGH (ref 65–99)

## 2017-05-07 SURGERY — ECHOCARDIOGRAM, TRANSESOPHAGEAL
Anesthesia: Moderate Sedation

## 2017-05-07 MED ORDER — MIDAZOLAM HCL 10 MG/2ML IJ SOLN
INTRAMUSCULAR | Status: DC | PRN
  Administered 2017-05-07: 2 mg via INTRAVENOUS

## 2017-05-07 MED ORDER — INSULIN ASPART 100 UNIT/ML ~~LOC~~ SOLN
0.0000 [IU] | Freq: Four times a day (QID) | SUBCUTANEOUS | Status: DC
  Administered 2017-05-08 (×2): 1 [IU] via SUBCUTANEOUS

## 2017-05-07 MED ORDER — MIDAZOLAM HCL 5 MG/ML IJ SOLN
INTRAMUSCULAR | Status: AC
  Filled 2017-05-07: qty 2

## 2017-05-07 MED ORDER — SODIUM CHLORIDE 0.9 % IV SOLN: INTRAVENOUS | Status: DC

## 2017-05-07 MED ORDER — FENTANYL CITRATE (PF) 100 MCG/2ML IJ SOLN
INTRAMUSCULAR | Status: AC
  Filled 2017-05-07: qty 2

## 2017-05-07 MED ORDER — FENTANYL CITRATE (PF) 100 MCG/2ML IJ SOLN
INTRAMUSCULAR | Status: DC | PRN
  Administered 2017-05-07: 25 ug via INTRAVENOUS

## 2017-05-07 MED ORDER — SODIUM CHLORIDE 0.9 % IV SOLN
INTRAVENOUS | Status: DC
  Administered 2017-05-07: 500 mL via INTRAVENOUS

## 2017-05-07 MED ORDER — BUTAMBEN-TETRACAINE-BENZOCAINE 2-2-14 % EX AERO
INHALATION_SPRAY | CUTANEOUS | Status: DC | PRN
  Administered 2017-05-07: 2 via TOPICAL

## 2017-05-07 NOTE — Telephone Encounter (Signed)
Sched appt 05/17/17 at 2:45. Spoke to pt's wife. Pt will be discharged to [place] soon.

## 2017-05-07 NOTE — Telephone Encounter (Signed)
-----   Message from Mena Goes, RN sent at 05/04/2017  5:05 PM EDT ----- Regarding: 10 days for suture removal   ----- Message ----- From: Gabriel Earing, PA-C Sent: 05/04/2017   4:24 PM To: [facility] Charge Pool  S/p revision of left arm fistula by Dr. Donnetta Hutching.  Pt will need nylon sutures out in ~10 days.  He can come on any day for nurse to remove sutures.    Thanks, Aldona Bar

## 2017-05-07 NOTE — Interval H&P Note (Signed)
History and Physical Interval Note:  05/07/2017 2:51 PM  [name] [name]  has presented today for surgery, with the diagnosis of BACTEREMIA  The various methods of treatment have been discussed with the patient and family. After consideration of risks, benefits and other options for treatment, the patient has consented to  Procedure(s): TRANSESOPHAGEAL ECHOCARDIOGRAM (TEE) (N/A) as a surgical intervention .  The patient's history has been reviewed, patient examined, no change in status, stable for surgery.  I have reviewed the patient's chart and labs.  Questions were answered to the patient's satisfaction.     Fransico Him

## 2017-05-07 NOTE — H&P (View-Only) (Signed)
History and Physical    [name] QQP:[id number] DOB: 1941-11-20 DOA: 04/21/2017  PCP: [name] Panning, NP   Patient coming from: Home.  I have personally briefly reviewed patient's old medical records in [place]  Chief Complaint: Blood infection.  HPI: [name] [name] is a 76 y.o. male with medical history significant of anemia, ESRD on hemodialysis, coronary artery disease, dementia, type 2 diabetes, heart transplant, history of stroke, hyperlipidemia, hypertension who is coming to the emergency department with a history of being called from urgent care in [place], [place], for he was seen yesterday for skin rash on both arms and left forearm skin tear, given extensive work up, including blood cultures and was recommended to be admitted at [facility], [place] for further evaluation and treatment. However, they decided to come back to his oral and follow-up with her local providers. Family mentions that they were called earlier today and told the blood cultures have been positive for strep and were asked to come to the local emergency department.   ED Course: Patient was given vancomycin and Zosyn. Workup in the emergency department shows WBC of 18.2 (this is increased from about 12, and yesterday in tendency), hemoglobin 11 g/dL and platelets 203. Sodium was 134, potassium 5.1, chloride 88, lactic acid 1.8 and bicarbonate 30 mmol/L. BUN was 57, creatinine 6.25 and glucose 189 mg/dL.  Imaging: CT scan of the head, abdomen and pelvis done in [place]. did not show any acute findings. Please see full reports for further detail.  Review of Systems: As per HPI otherwise 10 point review of systems negative.    Past Medical History:  Diagnosis Date  . Anemia   . Cancer ([place])    SKIN  . CKD III-IV    a. since transplant in 2001 with significant worsening since 06/2013.  Marland Kitchen Coronary artery disease   . Dementia    mild  . Diabetes mellitus without complication (HCC)    FASTING CBG  90S  . Headache(784.0)   . Heart transplanted ([place])    a. 1995 CABG x 4 in [place], [place];  b. 2001 Developed CHF;  c. 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 10/2000 Cardiac transplant @ [facility], [place];  e. Historically nl Bx and stress testing.  Last stress test ~ 5 yrs ago.  Marland Kitchen Hernia of abdominal wall   . History of blood transfusion   . History of stroke   . Hypercalcemia    a. 06/2013 Hospitalized in [place].  Marland Kitchen Hyperlipidemia   . Hypertension    a. Long h/o HTN, came off of all meds following transplant/wt loss-->meds resumed 06/2013, difficult to control since.  . Myocardial infarction ([place])    PRIOR TO HEART TRANSPLANT  . Orthostatic dizziness   . Orthostatic hypotension    a. Has tried support hose and compression stockings - prefers not to wear.  . Shortness of breath    EXERTION  . Stroke ([place]) 06/2013   NO RESIDUAL - WAS CONFUSED AT TIME - THAT HAS RESOLVED    Past Surgical History:  Procedure Laterality Date  . BASCILIC VEIN TRANSPOSITION Left 07/06/2014   Procedure: [place];  Surgeon: Rosetta Posner, MD;  Location: [facility];  Service: Vascular;  Laterality: Left;  . CARDIAC SURGERY    . CATARACT EXTRACTION    . CORONARY ARTERY BYPASS GRAFT    . CRANIOTOMY Right 05/04/2016   Procedure: Right Craniotomy for Evacuation of Subdural Hematoma;  Surgeon: Earnie Larsson, MD;  Location: [facility]  ORS;  Service: Neurosurgery;  Laterality: Right;  . EYE SURGERY Bilateral    CATARACTS  . HEART TRANSPLANT    . PERIPHERAL VASCULAR CATHETERIZATION N/A 09/06/2016   Procedure: Fistulagram - left arm;  Surgeon: Waynetta Sandy, MD;  Location: [facility] CV LAB;  Service: Cardiovascular;  Laterality: N/A;  . PERIPHERAL VASCULAR CATHETERIZATION Left 09/06/2016   Procedure: Peripheral Vascular Intervention;  Surgeon: Waynetta Sandy, MD;  Location: [facility] CV LAB;  Service: Cardiovascular;  Laterality: Left;  UPPER ARM COVERED STENT  . SKIN GRAFT FULL THICKNESS  LEG     cat scratch that did not heal  . VASECTOMY       reports that he has never smoked. He has never used smokeless tobacco. He reports that he does not drink alcohol or use drugs.  Allergies  Allergen Reactions  . Norvasc [Amlodipine Besylate] Swelling and Other (See Comments)    Swelling of the lower body     Family History  Problem Relation Age of Onset  . Heart attack Father        died @ 49  . Diabetes Father   . Heart attack Mother   . Diabetes Mother   . Kidney disease Maternal Aunt     Prior to Admission medications   Medication Sig Start Date End Date Taking? Authorizing Provider  acetaminophen (TYLENOL) 325 MG tablet Take 2 tablets (650 mg total) by mouth every 6 (six) hours as needed for mild pain (or Fever >/= 101). Patient taking differently: Take 650 mg by mouth 2 (two) times daily.  10/14/14  Yes Delfina Redwood, MD  amoxicillin-clavulanate (AUGMENTIN) 500-125 MG tablet Take 1 tablet by mouth 3 (three) times daily. 04/20/17  Yes [provider]  aspirin EC 81 MG tablet Take 81 mg by mouth at bedtime.   Yes [provider]  calcium acetate (PHOSLO) 667 MG capsule Take 1 capsule (667 mg total) by mouth 3 (three) times daily with meals. 08/24/16  Yes Ghimire, Henreitta Leber, MD  cholecalciferol (VITAMIN D) 1000 UNITS tablet Take 1,000 Units by mouth daily.   Yes [provider]  cycloSPORINE modified (NEORAL) 25 MG capsule Take 50-75 mg by mouth See admin instructions. 75 mg in the morning and 50 mg in the evening 08/18/14  Yes Crenshaw, Denice Bors, MD  diphenoxylate-atropine (LOMOTIL) 2.5-0.025 MG tablet Take 1 tablet by mouth 4 (four) times daily as needed for diarrhea or loose stools. 08/24/16  Yes Ghimire, Henreitta Leber, MD  donepezil (ARICEPT) 10 MG tablet Take 10 mg by mouth at bedtime.  03/01/15  Yes [provider]  FLUoxetine (PROZAC) 20 MG capsule Take 20 mg by mouth daily.    Yes [provider]  fluticasone (FLONASE) 50  MCG/ACT nasal spray Place 2 sprays into both nostrils at bedtime as needed for allergies.  04/12/16  Yes [provider]  lansoprazole (PREVACID) 30 MG capsule Take 30 mg by mouth 2 (two) times daily before a meal.  12/22/15  Yes [provider]  levothyroxine (SYNTHROID, LEVOTHROID) 75 MCG tablet Take 75 mcg by mouth daily.    Yes [provider]  metoprolol tartrate (LOPRESSOR) 25 MG tablet Take 2 tablets (50 mg total) by mouth 2 (two) times daily. 12/27/14  Yes Rai, Ripudeep K, MD  multivitamin (RENA-VIT) TABS tablet Take 1 tablet by mouth daily.  03/08/15  Yes [provider]  mycophenolate (CELLCEPT) 250 MG capsule Take 250-500 mg by mouth daily. Take 500 mg every morning and  take 250 mg every evening on Sunday, Monday, Wednesday and Friday then take 500 mg every evening on Tuesday Thursday and Saturday   Yes [provider]  pravastatin (PRAVACHOL) 10 MG tablet Take 10 mg by mouth at bedtime.    Yes [provider]  Probiotic Product (PRO-BIOTIC BLEND) CAPS Take 1 capsule by mouth daily.   Yes [provider]  vitamin B-12 (CYANOCOBALAMIN) 1000 MCG tablet Take 1,000 mcg by mouth daily with breakfast.   Yes [provider]  albuterol (PROVENTIL) (2.5 MG/3ML) 0.083% nebulizer solution Take 3 mLs (2.5 mg total) by nebulization every 6 (six) hours as needed for wheezing or shortness of breath. Patient not taking: Reported on 04/22/2017 11/09/16   Ward, Delice Bison, DO  amLODipine (NORVASC) 5 MG tablet Take 1 tablet (5 mg total) by mouth 2 (two) times daily. Patient not taking: Reported on 04/22/2017 02/06/17   Robbie Lis, MD  ciprofloxacin (CIPRO) 500 MG tablet Take 1 tablet (500 mg total) by mouth daily. Patient not taking: Reported on 04/22/2017 02/06/17   Robbie Lis, MD    Physical Exam: Vitals:   04/22/17 0215 04/22/17 0230 04/22/17 0245 04/22/17 0300  BP: (!) 196/72 (!) 186/69 (!) 179/68 (!) 169/68  Pulse: 79 78 77 75  Resp:  18   18  Temp:      TempSrc:      SpO2: 99% 98% 98% 98%  Weight:      Height:       Constitutional: NAD, calm, comfortable Eyes: PERRL, lids and conjunctivae normal ENMT: Mucous membranes are moist. Posterior pharynx clear of any exudate or lesions. Neck: normal, supple, no masses, no thyromegaly.  Respiratory: Clear to auscultation bilaterally, no wheezing, no crackles. Normal respiratory effort. No accessory muscle use.  Cardiovascular: Regular rate and rhythm, no murmurs / rubs / gallops. No extremity edema. 2+ pedal pulses. No carotid bruits. Left upper extremity AV fistula with good thrill. Abdomen: Soft, no tenderness, no masses palpated. No hepatosplenomegaly. Bowel sounds positive.  Musculoskeletal: no clubbing / cyanosis. Good ROM, no contractures. Normal muscle tone.  Skin: Raised skin lesion on right forearm and skin tear on left forearm. Neurologic: CN 2-12 grossly intact. Sensation intact, DTR normal. Strength 5/5 in all 4.  Psychiatric: Alert and oriented x 2.   Labs on Admission: I have personally reviewed following labs and imaging studies  CBC:  Recent Labs Lab 04/22/17 0145  WBC 18.2*  NEUTROABS 16.3*  HGB 11.0*  HCT 34.6*  MCV 96.6  PLT 536   Basic Metabolic Panel:  Recent Labs Lab 04/22/17 0145  NA 134*  K 5.1  CL 88*  CO2 30  GLUCOSE 189*  BUN 57*  CREATININE 6.25*  CALCIUM 8.6*   GFR: Estimated Creatinine Clearance: 9.4 mL/min (A) (by C-G formula based on SCr of 6.25 mg/dL (H)). Liver Function Tests:  Recent Labs Lab 04/22/17 0145  AST 23  ALT 21  ALKPHOS 115  BILITOT 0.5  PROT 6.3*  ALBUMIN 3.4*   No results for input(s): LIPASE, AMYLASE in the last 168 hours. No results for input(s): AMMONIA in the last 168 hours. Coagulation Profile: No results for input(s): INR, PROTIME in the last 168 hours. Cardiac Enzymes: No results for input(s): CKTOTAL, CKMB, CKMBINDEX, TROPONINI in the last 168 hours. BNP (last 3 results) No results  for input(s): PROBNP in the last 8760 hours. HbA1C: No results for input(s): HGBA1C in the last 72 hours. CBG: No results for input(s): GLUCAP in the  last 168 hours. Lipid Profile: No results for input(s): CHOL, HDL, LDLCALC, TRIG, CHOLHDL, LDLDIRECT in the last 72 hours. Thyroid Function Tests: No results for input(s): TSH, T4TOTAL, FREET4, T3FREE, THYROIDAB in the last 72 hours. Anemia Panel: No results for input(s): VITAMINB12, FOLATE, FERRITIN, TIBC, IRON, RETICCTPCT in the last 72 hours. Urine analysis:    Component Value Date/Time   COLORURINE YELLOW 02/03/2017 1340   APPEARANCEUR CLOUDY (A) 02/03/2017 1340   LABSPEC 1.013 02/03/2017 1340   PHURINE 8.0 02/03/2017 1340   GLUCOSEU NEGATIVE 02/03/2017 1340   HGBUR MODERATE (A) 02/03/2017 1340   BILIRUBINUR NEGATIVE 02/03/2017 1340   KETONESUR NEGATIVE 02/03/2017 1340   PROTEINUR 100 (A) 02/03/2017 1340   UROBILINOGEN 1.0 04/22/2015 1952   NITRITE NEGATIVE 02/03/2017 1340   LEUKOCYTESUR LARGE (A) 02/03/2017 1340    Radiological Exams on Admission: Dg Chest 2 View  Result Date: 04/22/2017 CLINICAL DATA:  Rash, positive blood cultures EXAM: CHEST  2 VIEW COMPARISON:  02/04/2017, 11/09/2016 FINDINGS: Post sternotomy changes. Ill-defined opacities in the peripheral right upper lobe, right base and left mid lung. No pleural effusion. Mild cardiomegaly with aortic atherosclerosis. No pneumothorax. Vascular stent in the left axilla. IMPRESSION: 1. Ill-defined multifocal opacity in the right upper lobe, right base and left upper lobe suspicious for infiltrates. 2. Cardiomegaly without edema. Electronically Signed   By: Donavan Foil M.D.   On: 04/22/2017 02:07   Echocardiogram 04/25/2016 ------------------------------------------------------------------- LV EF: 60% -   65%  ------------------------------------------------------------------- Indications:      CVA  436.  ------------------------------------------------------------------- History:   Risk factors:  History of heart transplant. End stage renal disease. Hypertension. Diabetes mellitus.  ------------------------------------------------------------------- Study Conclusions  - Left ventricle: The cavity size was normal. Wall thickness was   increased in a pattern of mild LVH. Systolic function was normal.   The estimated ejection fraction was in the range of 60% to 65%.   Wall motion was normal; there were no regional wall motion   abnormalities. Left ventricular diastolic function parameters   were normal. - Left atrium: The atrium was moderately dilated. - Atrial septum: No defect or patent foramen ovale was identified  EKG: Independently reviewed. N/A  Assessment/Plan Principal Problem:   Bacteremia Telemetry/Inpatient. Continue vancomycin per pharmacy. Continue Zosyn per pharmacy. Blood cultures results drawn on Friday unavailable at this time. May obtain later today by calling medical records in [place] at [phone]. Fax number is [phone]. Follow-up blood cultures and sensitivity drawn today.  Active Problems:   ESRD on dialysis [facility]) Please consult nephrology in AM    Heart transplanted [facility]) Continue cyclosporine and Cellcept.    Controlled diabetes mellitus type 2 with complications (HCC) Carbohydrate modified diet. CBG monitoring with regular insulin sliding scale while in the hospital.    Hypothyroidism Continue levothyroxine 75 g by mouth daily.      Barrett's esophagus Pantoprazole 40 mg by mouth daily.    Anemia of chronic disease Monitor hematocrit and hemoglobin.    Depression Continue fluoxetine 20 mg by mouth daily.      Uncontrolled hypertension Continue metoprolol 50 mg by mouth twice a day. Blood pressure monitoring.    DVT prophylaxis:Lovenox SQ.  Code Status: Full code. Family Communication:  Disposition Plan: Admit for  IV antibiotics.  Consults called:  Admission status: Inpatient/Telemetry.   Reubin Milan MD [facility] Pager [phone].  If 7PM-7AM, please contact night-coverage [email or web address] Password [email or web address]  04/22/2017, 3:09 AM

## 2017-05-07 NOTE — Progress Notes (Signed)
[facility] Progress Note   Subjective:  "I'm doing much better. I'm ready to go home!"  Objective Vitals:   05/06/17 0500 05/06/17 0944 05/06/17 2153 05/07/17 0520  BP: (!) 144/61 (!) 142/51 (!) 120/52 (!) 129/98  Pulse: 75 74 76 70  Resp:  _0 Temp: 98.6 F (37 C) 97.8 F (36.6 C) 98 F (36.7 C) 97.8 F (36.6 C)  TempSrc: Oral Oral Oral Oral  SpO2: 100% 100% 100% 100%  Weight: 59 kg (130 lb 1.1 oz)  60 kg (132 lb 4.4 oz)   Height:       Physical Exam General: chronically ill appearing male, NAD Heart:S1,S2, RRR.  Lungs: Decreased in bases bilaterally otherwise, CTAB Abdomen: Active BS Non-distended Extremities: No LE edema.  Dialysis Access: LUA AVF + bruit   Additional Objective Labs: Basic Metabolic Panel:  Recent Labs Lab 05/01/17 1642  05/03/17 0713  05/05/17 0432 05/06/17 0545 05/07/17 0439  NA 135  < > 136  < > 133* 136 134*  K 3.7  < > 4.0  < > 4.1 3.6 5.0  CL 94*  < > 96*  < > 95* 97* 95*  CO2 29  < > 26  < > _1 GLUCOSE 165*  < > 122*  < > 73 134* 97  BUN 34*  < > 49*  < > 24* 15 24*  CREATININE 4.99*  < > 7.36*  < > 5.37* 3.56* 4.91*  CALCIUM 8.2*  < > 7.9*  < > 8.1* 7.9* 8.1*  PHOS 3.1  --  5.2*  --   --   --   --   < > = values in this interval not displayed. Liver Function Tests:  Recent Labs Lab 05/01/17 1642 05/02/17 0528 05/03/17 0713  AST  --  15  --   ALT  --  18  --   ALKPHOS  --  92  --   BILITOT  --  0.7  --   PROT  --  5.4*  --   ALBUMIN 2.3* 2.2* 2.1*   No results for input(s): LIPASE, AMYLASE in the last 168 hours. CBC:  Recent Labs Lab 05/03/17 0603 05/04/17 0435 05/05/17 0432 05/06/17 0545 05/07/17 0439  WBC 7.7 7.2 8.7 7.8 8.0  NEUTROABS 4.7 3.8 5.6 5.3 5.0  HGB 8.8* 9.0* 8.4* 8.1* 8.1*  HCT 27.9* 29.3* 27.7* 26.8* 26.0*  MCV 96.2 99.3 98.2 98.2 97.7  PLT 362 403* 404* 401* 459*   Blood Culture    Component Value Date/Time   SDES BRONCHIAL ALVEOLAR LAVAGE 05/04/2017 1644   SPECREQUEST SPEC B 05/04/2017 1644   CULT (A) 05/04/2017 1644    30,000 COLONIES/mL METHICILLIN RESISTANT STAPHYLOCOCCUS AUREUS   REPTSTATUS 05/06/2017 FINAL 05/04/2017 1644    Cardiac Enzymes: No results for input(s): CKTOTAL, CKMB, CKMBINDEX, TROPONINI in the last 168 hours. CBG:  Recent Labs Lab 05/06/17 0747 05/06/17 1233 05/06/17 1711 05/06/17 2152 05/07/17 0744  GLUCAP 120* 126* 115* 147* 90   Iron Studies: No results for input(s): IRON, TIBC, TRANSFERRIN, FERRITIN in the last 72 hours. _2 @ Studies/Results: No results found. Medications: . vancomycin Stopped (05/05/17 1719)   . aspirin EC  81 mg Oral QHS  . calcium acetate  667 mg Oral TID WC  . Chlorhexidine Gluconate Cloth  6 each Topical Q0600  . cycloSPORINE modified  75 mg Oral Daily   And  . cycloSPORINE modified  50 mg Oral q1800  . darbepoetin (ARANESP)  injection - DIALYSIS  40 mcg Intravenous Q Thu-HD  . donepezil  10 mg Oral QHS  . FLUoxetine  20 mg Oral Daily  . heparin  5,000 Units Subcutaneous Q8H  . hydrALAZINE  25 mg Oral TID  . insulin aspart  0-9 Units Subcutaneous TID WC  . levothyroxine  75 mcg Oral QAC breakfast  . metoprolol tartrate  25 mg Oral BID  . mupirocin ointment   Nasal BID  . mycophenolate  250 mg Oral Once per day on Sun Mon Wed Fri   And  . mycophenolate  500 mg Oral Once per day on Tue Thu Sat  . mycophenolate  500 mg Oral Daily  . pantoprazole  20 mg Oral Daily  . pravastatin  10 mg Oral QHS  . vitamin B-12  1,000 mcg Oral Q breakfast   Dialysis Orders: [place] T, Th,S 4 hrs 2.0 K/2.25 Ca 61kg  400/Auto 1.5 -No Heparin  -Venofer 26m IV weekly (Last dose 04/12/17 Fe 106 Tsat 45% 04/12/17)   Assessment/Plan:  1. Cavitary lung disease - undergoing w/u per pulm, sp BAL, growth so far +MRSA. ECHO 6/6 - no vegetations. Afebrile, WBC 8.0. On Vanc per primary.  2. ESRD -TTS HD. Next HD Tuesday. K+5.0 3. Anemia - Hgb 8.1 Rec'd Aranesp 40 mcg 6/14. [facility]  4. Secondary hyperparathyroidism - Ca 7.9 Phos 5.2. cont binders, no VDRA.  5. HTN/volume - BP's good on metop/ hydralazine. Pt last wt 60 kg. Now under EDW. 1-1.5 liters with HD tomorrow.  6. Nutrition - Albumin 2.1Renal/Carb mod diet, prostat and renal vit - NPO for procedure 7. DM: per primary BS 97 8. Ulcerated AVF: S/P revision of AVF 6/15, cont to use aVF. Strong bruit.  9. H/O Cardiac transplant: Cont immunosuppressant therapy    Rita H. Brown NP-C 05/07/2017, 9:00 AM  [phone]

## 2017-05-07 NOTE — Progress Notes (Signed)
    [facility] for Infectious Disease    Date of Admission:  05/01/2017      Reason for visit: Follow up on MRSA bacteremia  Subjective: Patient with food tray at bedside, reports he ate. Instructed him to remain NPO since has TEE scheduled later for this afternoon. Denies any chest pain, SOB.  Medications:  . aspirin EC  81 mg Oral QHS  . calcium acetate  667 mg Oral TID WC  . Chlorhexidine Gluconate Cloth  6 each Topical Q0600  . cycloSPORINE modified  75 mg Oral Daily   And  . cycloSPORINE modified  50 mg Oral q1800  . darbepoetin (ARANESP) injection - DIALYSIS  40 mcg Intravenous Q Thu-HD  . donepezil  10 mg Oral QHS  . FLUoxetine  20 mg Oral Daily  . heparin  5,000 Units Subcutaneous Q8H  . hydrALAZINE  25 mg Oral TID  . insulin aspart  0-9 Units Subcutaneous Q6H  . levothyroxine  75 mcg Oral QAC breakfast  . metoprolol tartrate  25 mg Oral BID  . mupirocin ointment   Nasal BID  . mycophenolate  250 mg Oral Once per day on Sun Mon Wed Fri   And  . mycophenolate  500 mg Oral Once per day on Tue Thu Sat  . mycophenolate  500 mg Oral Daily  . pantoprazole  20 mg Oral Daily  . pravastatin  10 mg Oral QHS  . vitamin B-12  1,000 mcg Oral Q breakfast    Objective: Vital signs in last 24 hours: Temp:  [97.8 F (36.6 C)-98 F (36.7 C)] 97.8 F (36.6 C) (06/18 0520) Pulse Rate:  [70-76] 70 (06/18 0520) Resp:  [18] 18 (06/18 0520) BP: (120-142)/(51-98) 129/98 (06/18 0520) SpO2:  [100 %] 100 % (06/18 0520) Weight:  [132 lb 4.4 oz (60 kg)] 132 lb 4.4 oz (60 kg) (06/17 2153)  General: Elderly man resting in bed, NAD CV: RRR, no m/g/r Pulm: CTA bilaterally, breaths non-labored   Lab Results  Recent Labs  05/06/17 0545 05/07/17 0439  WBC 7.8 8.0  HGB 8.1* 8.1*  HCT 26.8* 26.0*  NA 136 134*  K 3.6 5.0  CL 97* 95*  CO2 30 28  BUN 15 24*  CREATININE 3.56* 4.91*    Microbiology: Blood cx 6/12>> NGTD BAL 6/15>> MRSA  Assessment/Plan:  MRSA bacteremia:  Blood cx remain negative. Hopefully will get TEE this afternoon to evaluate for endocarditis. Continue Vancomycin with HD.   Cavitary lung lesions: BAL growing MRSA (2 out of 2). Viral cx neg. Aspergillus, PCP, fungal cx, acid fast, cytology, legionella pending. Will follow up. On Vanc as above.  Hx heart transplant on immunosuppressive therapy: Continue cyclosporine and cellcept.  ESRD on HD with AVF ulceration: s/p revision by VVS on 6/15. Ok to use for HD per Renal.   Attending note to follow.   Albin Felling, MD, MPH Internal Medicine Resident, PGY-III Pager: [phone] 05/07/2017, 9:42 AM

## 2017-05-07 NOTE — Progress Notes (Signed)
OT Cancellation   05/07/17 1400  OT Visit Information  Last OT Received On 05/07/17  Reason Eval/Treat Not Completed Patient at procedure or test/ unavailable (Off the floor for TEE. Will revisit as schedule allows. )   [place], OTR/L Acute Rehab Pager: [phone] Office: [phone]

## 2017-05-07 NOTE — Progress Notes (Signed)
  Echocardiogram Echocardiogram Transesophageal has been performed.  Darlina Sicilian M 05/07/2017, 3:30 PM

## 2017-05-07 NOTE — CV Procedure (Signed)
    PROCEDURE NOTE:  Procedure:  Transesophageal echocardiogram Operator:  Fransico Him, MD Indications:  Bacteremia Complications: None  During this procedure the patient is administered a total of Versed 3 mg and Fentanyl 25 mcg to achieve and maintain moderate conscious sedation.  The patient's heart rate, blood pressure, and oxygen saturation are monitored continuously during the procedure. The period of conscious sedation is 15 minutes, of which I was present face-to-face 100% of this time.  Results: Normal LV size and function with EF 55-60% Normal RV size and function Normal RA Normal LA Normal TV with mild TR Normal PV with trivial PR Elongated anterior MV leaflet with no prolapse.  There is trivial MR. Normal trileaflet AV with trivial eccentric AR Normal interatrial septum with no evidence of shunt by colorflow dopper  Normal thoracic and ascending aorta.  The patient tolerated the procedure well and was transferred back to their room in stable condition.  Signed: Fransico Him, MD [facility]

## 2017-05-07 NOTE — Care Management Important Message (Signed)
Important Message  Patient Details  Name: Morio Widen MRN: [id number] Date of Birth: 06/07/1941   Medicare Important Message Given:  Yes    Sharrell Krawiec, Rory Percy, RN 05/07/2017, 11:41 AM

## 2017-05-07 NOTE — Interval H&P Note (Signed)
History and Physical Interval Note:  05/07/2017 1:27 PM  [name] [name]  has presented today for surgery, with the diagnosis of BACTEREMIA  The various methods of treatment have been discussed with the patient and family. After consideration of risks, benefits and other options for treatment, the patient has consented to  Procedure(s): TRANSESOPHAGEAL ECHOCARDIOGRAM (TEE) (N/A) as a surgical intervention .  The patient's history has been reviewed, patient examined, no change in status, stable for surgery.  I have reviewed the patient's chart and labs.  Questions were answered to the patient's satisfaction.     Fransico Him

## 2017-05-07 NOTE — Progress Notes (Signed)
[facility]    ADMISSION DATE:05/01/2017  CONSULTATION DATE:05/02/2017  REFERRING MD:Dr. Maylene Roes   CHIEF COMPLAINT:Cavitary Lung Lesion   Presenting HPI:  76 y.o. 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 He was recently admitted from 6/2-6/7 with bacteremia. The patient was on vacation in [place], [place] the week prior when he noted a wound on his left arm (6/1). He was prescribed Augmentin and topical mupirocin ointment and sent home. That evening, he became altered and was taken to [facility] in [place], [place] (phone# [phone]). CT of the head, abdomen and blood cultures obtained. They left vacation and drove to [facility] for further medical evaluation. He was seen in [facility] and found to have bilateral infiltrates worrisome for HCAP. He was treated with IV vancomycin &zosyn. Repeat blood cultures at [facility] were negative. He was evaluated by ID. Family reported at that time that they thought it was streptococcal bacteremia. He was discharged on Augmentin and instructed to follow up with Dermatology after discharge. He was discharged to a SNF for rehab.  returned with shortness of breath and nonproductive cough as well as diffuse pain. Patient found to have cavitary lung lesions prompting consultation of the Pulmonary Service.  Subjective:    Feels well. Pt examined in HD Denies cough, sputum production.  Temp:  [97.7 F (36.5 C)-98 F (36.7 C)] 97.7 F (36.5 C) (06/18 0845) Pulse Rate:  [68-76] 68 (06/18 0845) Resp:  [17-18] 17 (06/18 0845) BP: (120-146)/(52-98) 146/62 (06/18 0845) SpO2:  [100 %] 100 % (06/18 0845) Weight:  [132 lb 4.4 oz (60 kg)] 132 lb 4.4 oz (60 kg) (06/17 2153)   Gen:      No acute distress HEENT:  EOMI, sclera anicteric Neck:     No masses; no  thyromegaly Lungs:    Clear to auscultation bilaterally; normal respiratory effort CV:         Regular rate and rhythm; no murmurs Abd:      + bowel sounds; soft, non-tender; no palpable masses, no distension Ext:    No edema; adequate peripheral perfusion Skin:      Warm and dry; no rash Neuro: alert and oriented x 3 Psych: normal mood and affect  CBC Recent Labs     05/05/17  0432  05/06/17  0545  05/07/17  0439  WBC  8.7  7.8  8.0  HGB  8.4*  8.1*  8.1*  HCT  27.7*  26.8*  26.0*  PLT  404*  401*  459*    Coag's No results for input(s): APTT, INR in the last 72 hours.  BMET Recent Labs     05/05/17  0432  05/06/17  0545  05/07/17  0439  NA  133*  136  134*  K  4.1  3.6  5.0  CL  95*  97*  95*  CO2  _0 BUN  24*  15  24*  CREATININE  5.37*  3.56*  4.91*  GLUCOSE  73  134*  97    Electrolytes Recent Labs     05/05/17  0432  05/06/17  0545  05/07/17  0439  CALCIUM  8.1*  7.9*  8.1*    Sepsis Markers Recent Labs     05/05/17  0432  PROCALCITON  0.29    ABG No results for input(s): PHART, PCO2ART,  PO2ART in the last 72 hours.  Liver Enzymes No results for input(s): AST, ALT, ALKPHOS, BILITOT, ALBUMIN in the last 72 hours.  Cardiac Enzymes No results for input(s): TROPONINI, PROBNP in the last 72 hours.  Glucose Recent Labs     05/05/17  2225  05/06/17  0747  05/06/17  1233  05/06/17  1711  05/06/17  2152  05/07/17  0744  GLUCAP  172*  120*  126*  115*  147*  90    Imaging No results found.   IMAGING/STUDIES: CT CHEST W/O 6/12:  Previously reviewed by me. No pathologic mediastinal adenopathy. No pericardial effusion. Very small pleural effusion on the right versus pleural thickening with some calcification. Cavitary opacity within the lateral, subpleural right upper lobe as well as nodular opacities within left upper lobe and lingula at least one of which is cavitary. Splenic calcification noted.  MICROBIOLOGY: Blood Cultures x2  6/3:  Negative MRSA PCR 6/3:  Positive Blood Cultures x2 6/12 >>>  ANTIBIOTICS: Cefepime 6/12 (x1 dose) Zosyn 6/13 (x1 dose) Cefuroxime 6/15 (x1 dose for surgical prophylaxis) Vancomycin 6/12 >>>  ASSESSMENT/PLAN:  MRSA PNA Cavitary Pneumonia: MRSA (BAL + MRSA from FOB on 15th) Immunosuppression   Plan Continue antibiotics per ID PCCM will sign off. Please call with any questions  Marshell Garfinkel MD [facility] Pager [phone] If no answer or after 3pm call: [phone] 05/08/2017, 9:36 AM

## 2017-05-07 NOTE — Progress Notes (Addendum)
PROGRESS NOTE    [name] [name]  MLJ:[id number] DOB: 1941/02/27 DOA: 05/01/2017 PCP: Junie Panning, NP  Outpatient Specialists:     Brief Narrative:   77 male Heart transplant 2001 [facility] on CellCept and cyclosporine ESRD TTS, EDW 62 kg Orthostatic hypotension CVA SDH secondary to fall with craniotomy and evacuation 05/04/2016 Patellar fracture 06/2016 DM TY2 HLD Reported history of Barrett's esophagus Hypothyroidism Hypertension poorly controlled Anemia chronic disease Recent admission 6/2-6/7 presumed streptococcal bacteremia at outside facility [facility] in [facility] center  At that admission Rx vancomycin and Zosyn, transition Augmentin and discharged to rehabilitation 6/7  Returned to ED 6/12 worse with infiltrates increasing shortness of breath      Assessment & Plan:   Active Problems:   Hypercholesterolemia   Heart transplant status ([place])   DM (diabetes mellitus), type 2 with renal complications (HCC)   Hypothyroidism   ESRD on dialysis ([place])   Protein-calorie malnutrition, severe (HCC)   Anemia of chronic disease   Depression   Generalized weakness   HCAP (healthcare-associated pneumonia)   Pressure injury of skin   Cavitary lesion of lung   Bacteremia   ID  MRSA bacteremia, BAL 6/16 = MRSA, blood culture negative [has been on antibiotics since 6/7]  Partially Rx PNA complicated by Immunosuppressant meds for transplant  TEE does not show a vegetation  await further pneumocystis, fungal, Aspergillus, Legionella, infectious disease--suspect this might just be natural course of necrotizing PNA in an immunocopromised patient  Will ? Need 6 weeks of Vanc c dialysis  CAD Heart transplant done at [facility] as per usual home protocol, cyclosporine 75 am, 50 pm Cont Prograf Levels to adjust as per Renal  ESRD TTS  AV fistula ulceration repaired 6/15 Secondary  hypoparathyroidism Anemia of renal disease EDW 62 Per nephrology  DM ty II  Sugars 90-100, sensitive coverage  Patient not on Home medications   Reported history Barrett's esophagus   continue PPI   Depression/dementia   Continue fluoxetine and Aricept   Will need to return to skilled facility [facility] when stabilized   Poorly controlled hypertension  On metoprolol at home 50 twice a day, hesitant to use amlodipine secondary to lower extremity  swelling     scd Full code Inpatient Not ready for d/c   Consultants:   ID  Nephro  Procedures:   TEE  Antimicrobials:   Vancomycin    Subjective:  Well Just back from dialysis and TEE Eating calm no pain no fever no chills  Objective: Vitals:   05/07/17 1510 05/07/17 1527 05/07/17 1530 05/07/17 1537  BP: (!) 167/43 (!) 122/25 (!) 120/39 (!) 115/44  Pulse: 66 70 70 69  Resp: _0 Temp:  97.8 F (36.6 C)    TempSrc:  Oral    SpO2: 99% 96% 97% 98%  Weight:      Height:        Intake/Output Summary (Last 24 hours) at 05/07/17 1600 Last data filed at 05/07/17 1000  Gross per 24 hour  Intake              360 ml  Output               10 ml  Net              350 ml   Filed Weights   05/05/17 1719 05/06/17 0500 05/06/17 2153  Weight: 65 kg (143 lb 4.8 oz)  59 kg (130 lb 1.1 oz) 60 kg (132 lb 4.4 oz)    Examination:  Flat, pleasant eomi ncat No ict no pallor s1 s2 HSM And soft nt nd no rebound No le edema   Data Reviewed: I have personally reviewed following labs and imaging studies  CBC:  Recent Labs Lab 05/03/17 0603 05/04/17 0435 05/05/17 0432 05/06/17 0545 05/07/17 0439  WBC 7.7 7.2 8.7 7.8 8.0  NEUTROABS 4.7 3.8 5.6 5.3 5.0  HGB 8.8* 9.0* 8.4* 8.1* 8.1*  HCT 27.9* 29.3* 27.7* 26.8* 26.0*  MCV 96.2 99.3 98.2 98.2 97.7  PLT 362 403* 404* 401* 324*   Basic Metabolic Panel:  Recent Labs Lab 05/01/17 1642  05/03/17 0713 05/04/17 0435 05/05/17 0432  05/06/17 0545 05/07/17 0439  NA 135  < > 136 136 133* 136 134*  K 3.7  < > 4.0 3.9 4.1 3.6 5.0  CL 94*  < > 96* 95* 95* 97* 95*  CO2 29  < > _0 GLUCOSE 165*  < > 122* 86 73 134* 97  BUN 34*  < > 49* 17 24* 15 24*  CREATININE 4.99*  < > 7.36* 3.88* 5.37* 3.56* 4.91*  CALCIUM 8.2*  < > 7.9* 8.1* 8.1* 7.9* 8.1*  MG 2.0  --   --   --   --   --   --   PHOS 3.1  --  5.2*  --   --   --   --   < > = values in this interval not displayed. GFR: Estimated Creatinine Clearance: 10.9 mL/min (A) (by C-G formula based on SCr of 4.91 mg/dL (H)). Liver Function Tests:  Recent Labs Lab 05/01/17 1642 05/02/17 0528 05/03/17 0713  AST  --  15  --   ALT  --  18  --   ALKPHOS  --  92  --   BILITOT  --  0.7  --   PROT  --  5.4*  --   ALBUMIN 2.3* 2.2* 2.1*   No results for input(s): LIPASE, AMYLASE in the last 168 hours. No results for input(s): AMMONIA in the last 168 hours. Coagulation Profile:  Recent Labs Lab 05/04/17 0435  INR 1.15   Cardiac Enzymes: No results for input(s): CKTOTAL, CKMB, CKMBINDEX, TROPONINI in the last 168 hours. BNP (last 3 results) No results for input(s): PROBNP in the last 8760 hours. HbA1C: No results for input(s): HGBA1C in the last 72 hours. CBG:  Recent Labs Lab 05/06/17 1233 05/06/17 1711 05/06/17 2152 05/07/17 0744 05/07/17 1409  GLUCAP 126* 115* 147* 90 127*   Lipid Profile: No results for input(s): CHOL, HDL, LDLCALC, TRIG, CHOLHDL, LDLDIRECT in the last 72 hours. Thyroid Function Tests: No results for input(s): TSH, T4TOTAL, FREET4, T3FREE, THYROIDAB in the last 72 hours. Anemia Panel: No results for input(s): VITAMINB12, FOLATE, FERRITIN, TIBC, IRON, RETICCTPCT in the last 72 hours. Urine analysis:    Component Value Date/Time   COLORURINE YELLOW 02/03/2017 1340   APPEARANCEUR CLOUDY (A) 02/03/2017 1340   LABSPEC 1.013 02/03/2017 1340   PHURINE 8.0 02/03/2017 1340   GLUCOSEU NEGATIVE 02/03/2017 1340   HGBUR MODERATE (A)  02/03/2017 1340   BILIRUBINUR NEGATIVE 02/03/2017 1340   KETONESUR NEGATIVE 02/03/2017 1340   PROTEINUR 100 (A) 02/03/2017 1340   UROBILINOGEN 1.0 04/22/2015 1952   NITRITE NEGATIVE 02/03/2017 1340   LEUKOCYTESUR LARGE (A) 02/03/2017 1340   Sepsis Labs: _1 (procalcitonin:4,lacticidven:4)  ) Recent Results (from the past 240 hour(s))  Blood culture (routine x 2)     Status: None   Collection Time: 05/01/17  5:45 PM  Result Value Ref Range Status   Specimen Description BLOOD RIGHT ANTECUBITAL  Final   Special Requests   Final    BOTTLES DRAWN AEROBIC AND ANAEROBIC Blood Culture adequate volume   Culture NO GROWTH 5 DAYS  Final   Report Status 05/06/2017 FINAL  Final  Blood culture (routine x 2)     Status: None   Collection Time: 05/01/17  6:00 PM  Result Value Ref Range Status   Specimen Description BLOOD RIGHT HAND  Final   Special Requests   Final    BOTTLES DRAWN AEROBIC ONLY Blood Culture adequate volume   Culture NO GROWTH 5 DAYS  Final   Report Status 05/06/2017 FINAL  Final  Virus culture     Status: None   Collection Time: 05/04/17  4:28 PM  Result Value Ref Range Status   Viral Culture Comment  Final    Comment: (NOTE) Preliminary Report: No virus isolated at 24 hours. Next report to follow after 4 days. Performed At: [facility] [place], [place] [id number] Lindon Romp MD KJ:[id number]    Source of Sample BRONCHIAL ALVEOLAR LAVAGE  Final    Comment: Heil A  Culture, bal-quantitative     Status: Abnormal   Collection Time: 05/04/17  4:28 PM  Result Value Ref Range Status   Specimen Description BRONCHIAL ALVEOLAR LAVAGE  Final   Special Requests SPEC A  Final   Culture (A)  Final    5,000 COLONIES/mL METHICILLIN RESISTANT STAPHYLOCOCCUS AUREUS   Report Status 05/07/2017 FINAL  Final   Organism ID, Bacteria METHICILLIN RESISTANT STAPHYLOCOCCUS AUREUS (A)  Final      Susceptibility   Methicillin resistant staphylococcus  aureus - MIC*    CIPROFLOXACIN >=8 RESISTANT Resistant     ERYTHROMYCIN >=8 RESISTANT Resistant     GENTAMICIN <=0.5 SENSITIVE Sensitive     OXACILLIN >=4 RESISTANT Resistant     TETRACYCLINE >=16 RESISTANT Resistant     VANCOMYCIN <=0.5 SENSITIVE Sensitive     TRIMETH/SULFA <=10 SENSITIVE Sensitive     CLINDAMYCIN RESISTANT Resistant     RIFAMPIN <=0.5 SENSITIVE Sensitive     Inducible Clindamycin POSITIVE Resistant     * 5,000 COLONIES/mL METHICILLIN RESISTANT STAPHYLOCOCCUS AUREUS  Virus culture     Status: None   Collection Time: 05/04/17  4:44 PM  Result Value Ref Range Status   Viral Culture Comment  Final    Comment: (NOTE) Preliminary Report: No virus isolated at 24 hours. Next report to follow after 4 days. Performed At: [facility] [place], [place] [id number] Lindon Romp MD PT:[id number]    Source of Sample BRONCHIAL ALVEOLAR LAVAGE  Final    Comment: Lester B  Culture, bal-quantitative     Status: Abnormal   Collection Time: 05/04/17  4:44 PM  Result Value Ref Range Status   Specimen Description BRONCHIAL ALVEOLAR LAVAGE  Final   Special Requests SPEC B  Final   Culture (A)  Final    30,000 COLONIES/mL METHICILLIN RESISTANT STAPHYLOCOCCUS AUREUS   Report Status 05/06/2017 FINAL  Final   Organism ID, Bacteria METHICILLIN RESISTANT STAPHYLOCOCCUS AUREUS (A)  Final      Susceptibility   Methicillin resistant staphylococcus aureus - MIC*    CIPROFLOXACIN >=8 RESISTANT Resistant     ERYTHROMYCIN >=8 RESISTANT Resistant     GENTAMICIN <=0.5 SENSITIVE Sensitive  OXACILLIN >=4 RESISTANT Resistant     TETRACYCLINE >=16 RESISTANT Resistant     VANCOMYCIN <=0.5 SENSITIVE Sensitive     TRIMETH/SULFA <=10 SENSITIVE Sensitive     CLINDAMYCIN RESISTANT Resistant     RIFAMPIN <=0.5 SENSITIVE Sensitive     Inducible Clindamycin POSITIVE Resistant     * 30,000 COLONIES/mL METHICILLIN RESISTANT STAPHYLOCOCCUS AUREUS         Radiology  Studies: No results found.      Scheduled Meds: . aspirin EC  81 mg Oral QHS  . calcium acetate  667 mg Oral TID WC  . Chlorhexidine Gluconate Cloth  6 each Topical Q0600  . cycloSPORINE modified  75 mg Oral Daily   And  . cycloSPORINE modified  50 mg Oral q1800  . darbepoetin (ARANESP) injection - DIALYSIS  40 mcg Intravenous Q Thu-HD  . donepezil  10 mg Oral QHS  . FLUoxetine  20 mg Oral Daily  . heparin  5,000 Units Subcutaneous Q8H  . hydrALAZINE  25 mg Oral TID  . insulin aspart  0-9 Units Subcutaneous Q6H  . levothyroxine  75 mcg Oral QAC breakfast  . metoprolol tartrate  25 mg Oral BID  . mupirocin ointment   Nasal BID  . mycophenolate  250 mg Oral Once per day on Sun Mon Wed Fri   And  . mycophenolate  500 mg Oral Once per day on Tue Thu Sat  . mycophenolate  500 mg Oral Daily  . pantoprazole  20 mg Oral Daily  . pravastatin  10 mg Oral QHS  . vitamin B-12  1,000 mcg Oral Q breakfast   Continuous Infusions: . vancomycin Stopped (05/05/17 1719)     LOS: 6 days    Time spent: Dixon, MD [facility] ([facility]   If 7PM-7AM, please contact night-coverage [email or web address] Password [email or web address] 05/07/2017, 4:00 PM

## 2017-05-08 DIAGNOSIS — A4902 Methicillin resistant Staphylococcus aureus infection, unspecified site: Secondary | ICD-10-CM

## 2017-05-08 LAB — CBC
HEMATOCRIT: 26.5 % — AB (ref 39.0–52.0)
Hemoglobin: 8.1 g/dL — ABNORMAL LOW (ref 13.0–17.0)
MCH: 29.9 pg (ref 26.0–34.0)
MCHC: 30.6 g/dL (ref 30.0–36.0)
MCV: 97.8 fL (ref 78.0–100.0)
PLATELETS: 429 10*3/uL — AB (ref 150–400)
RBC: 2.71 MIL/uL — ABNORMAL LOW (ref 4.22–5.81)
RDW: 15.2 % (ref 11.5–15.5)
WBC: 8 10*3/uL (ref 4.0–10.5)

## 2017-05-08 LAB — BASIC METABOLIC PANEL
BUN: 35 — AB (ref 4–21)
Creatinine: 6.1 — AB (ref 0.6–1.3)
GLUCOSE: 76
Sodium: 135 — AB (ref 137–147)

## 2017-05-08 LAB — RENAL FUNCTION PANEL
Albumin: 2.2 g/dL — ABNORMAL LOW (ref 3.5–5.0)
Anion gap: 13 (ref 5–15)
BUN: 35 mg/dL — AB (ref 6–20)
CHLORIDE: 98 mmol/L — AB (ref 101–111)
CO2: 24 mmol/L (ref 22–32)
CREATININE: 6.07 mg/dL — AB (ref 0.61–1.24)
Calcium: 8.3 mg/dL — ABNORMAL LOW (ref 8.9–10.3)
GFR calc Af Amer: 9 mL/min — ABNORMAL LOW (ref >60)
GFR, EST NON AFRICAN AMERICAN: 8 mL/min — AB (ref >60)
GLUCOSE: 76 mg/dL (ref 65–99)
POTASSIUM: 4.7 mmol/L (ref 3.5–5.1)
Phosphorus: 3.9 mg/dL (ref 2.5–4.6)
Sodium: 135 mmol/L (ref 135–145)

## 2017-05-08 LAB — PNEUMOCYSTIS JIROVECI SMEAR BY DFA
Pneumocystis jiroveci Ag: NEGATIVE
Pneumocystis jiroveci Ag: NEGATIVE

## 2017-05-08 LAB — ASPERGILLUS ANTIGEN, BAL/SERUM: Aspergillus Ag, BAL/Serum: 0.03 Index (ref 0.00–0.49)

## 2017-05-08 LAB — PATHOLOGIST SMEAR REVIEW

## 2017-05-08 LAB — GLUCOSE, CAPILLARY
Glucose-Capillary: 125 mg/dL — ABNORMAL HIGH (ref 65–99)
Glucose-Capillary: 140 mg/dL — ABNORMAL HIGH (ref 65–99)
Glucose-Capillary: 146 mg/dL — ABNORMAL HIGH (ref 65–99)
Glucose-Capillary: 78 mg/dL (ref 65–99)

## 2017-05-08 LAB — CBC AND DIFFERENTIAL: WBC: 8

## 2017-05-08 MED ORDER — PENTAFLUOROPROP-TETRAFLUOROETH EX AERO: 1.0000 "application " | INHALATION_SPRAY | CUTANEOUS | Status: DC | PRN

## 2017-05-08 MED ORDER — LIDOCAINE HCL (PF) 1 % IJ SOLN: 5.0000 mL | INTRAMUSCULAR | Status: DC | PRN

## 2017-05-08 MED ORDER — SODIUM CHLORIDE 0.9 % IV SOLN: 100.0000 mL | INTRAVENOUS | Status: DC | PRN

## 2017-05-08 MED ORDER — VANCOMYCIN HCL IN DEXTROSE 500-5 MG/100ML-% IV SOLN
INTRAVENOUS | Status: AC
  Administered 2017-05-08: 500 mg
  Filled 2017-05-08: qty 100

## 2017-05-08 MED ORDER — INSULIN ASPART 100 UNIT/ML ~~LOC~~ SOLN
0.0000 [IU] | Freq: Three times a day (TID) | SUBCUTANEOUS | Status: DC
  Administered 2017-05-08: 1 [IU] via SUBCUTANEOUS

## 2017-05-08 MED ORDER — LIDOCAINE-PRILOCAINE 2.5-2.5 % EX CREA: 1.0000 "application " | TOPICAL_CREAM | CUTANEOUS | Status: DC | PRN

## 2017-05-08 MED ORDER — VANCOMYCIN HCL 500 MG IV SOLR: 500.0000 mg | INTRAVENOUS | Status: DC

## 2017-05-08 MED ORDER — MORPHINE SULFATE (PF) 2 MG/ML IV SOLN: 1.0000 mg | INTRAVENOUS | Status: DC | PRN

## 2017-05-08 MED ORDER — ACETAMINOPHEN 325 MG PO TABS
ORAL_TABLET | ORAL | Status: AC
  Administered 2017-05-08: 650 mg via ORAL
  Filled 2017-05-08: qty 2

## 2017-05-08 NOTE — Progress Notes (Signed)
CSW following to facilitate discharge to [facility]. CSW faxed for insurance authorization through [facility] Medicare. Insurance representative contacted CSW to request updated PT and OT clinicals from the past 24-48 hours. CSW alerted PT and OT on floor, who indicated patient was refusing due to fatigue from dialysis.   CSW met with patient to explain the need to work with therapies in order to obtain insurance approval to pay for SNF placement and to discharge the patient out of the hospital. Patient agreed to work with therapies.  CSW will follow to obtain updated clinicals to fax for insurance approval.  Laveda Abbe LCSW [phone]

## 2017-05-08 NOTE — NC FL2 (Signed)
[place] LEVEL OF CARE SCREENING TOOL     IDENTIFICATION  Patient Name: [name] [name] Birthdate: [birth year] Sex: male Admission Date (Current Location): 05/01/2017  [facility] and [place] Number:  Herbalist and Address:  [facility], [place] [address] [place], [place] [address]      Provider Number: [id number]  Attending Physician Name and Address:  Nita Sells, MD  Relative Name and Phone Number:       Current Level of Care: Hospital Recommended Level of Care: [place] Prior Approval Number:    Date Approved/Denied:   PASRR Number: [id number]  Discharge Plan: SNF    Current Diagnoses: Patient Active Problem List   Diagnosis Date Noted  . Bacteremia   . Pressure injury of skin 05/02/2017  . Cavitary lesion of lung   . HCAP (healthcare-associated pneumonia) 05/01/2017  . Malnutrition of moderate degree 04/25/2017  . Streptococcal bacteremia 04/22/2017  . Acute metabolic encephalopathy 31/54/0086  . Volume overload 02/03/2017  . Generalized weakness 08/22/2016  . Urinary tract infection   . Syncope and collapse   . Syncope 08/07/2016  . Faintness   . Uncontrolled hypertension   . Acute blood loss anemia   . Cardiomyopathy ([place])   . Controlled diabetes mellitus type 2 with complications ([place])   . 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 Traumatic subdural hematoma ([place]) 05/04/2016  . 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 Subdural hematoma ([place]) 05/02/2016  . CKD (chronic kidney disease)   . Protein-calorie malnutrition, severe ([place]) 04/23/2015  . Anemia of chronic disease 04/23/2015  . Depression 04/23/2015  . UTI (urinary tract infection) 04/22/2015  . Barrett's esophagus 03/22/2015  . ESRD on dialysis ([place]) 10/12/2014  . DM (diabetes mellitus), type 2 with renal complications ([place]) 76/19/5093  . Hypothyroidism 10/06/2014  . Heart transplant status  ([place])   . Hypercholesterolemia 06/20/2014  . 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[place]) 06/20/2014    Orientation RESPIRATION BLADDER Height & Weight     Time, Self, Situation, Place  Normal Incontinent Weight: 149 lb 14.6 oz (68 kg) Height:  5\' 10"  (177.8 cm)  BEHAVIORAL SYMPTOMS/MOOD NEUROLOGICAL BOWEL NUTRITION STATUS      Incontinent    AMBULATORY STATUS COMMUNICATION OF NEEDS Skin   Limited Assist Verbally PU Stage and Appropriate Care, Skin abrasions, Surgical wounds PU Stage 1 Dressing:  (PRN)                     Personal Care Assistance Level of Assistance  Bathing, Dressing Bathing Assistance: Limited assistance Feeding assistance: Independent Dressing Assistance: Limited assistance     Functional Limitations Info  Hearing Sight Info: Adequate Hearing Info: Impaired Speech Info: Adequate    SPECIAL CARE FACTORS FREQUENCY  PT (By licensed PT)     PT Frequency: 5x/WK              Contractures      Additional Factors Info  Code Status, Allergies, Psychotropic, Insulin Sliding Scale Code Status Info: FULL Allergies Info: Norvasc Amlodipine Besylate Psychotropic Info: Prozac 20mg  Insulin Sliding Scale Info: every 6 hours       Current Medications (05/08/2017):  This is the current hospital active medication list Current Facility-Administered Medications  Medication Dose Route Frequency Provider Last Rate Last Dose  . 0.9 %  sodium chloride infusion   Intravenous Continuous Eileen Stanford, PA-C      . acetaminophen (TYLENOL) tablet 650 mg  650 mg Oral Q6H PRN Reyne Dumas, MD   650 mg at 05/08/17 0932   Or  . acetaminophen (TYLENOL) suppository 650 mg  650 mg Rectal Q6H PRN Reyne Dumas, MD      . aspirin EC tablet 81 mg  81 mg Oral QHS Reyne Dumas, MD   81 mg at 05/07/17 2153  . calcium acetate (PHOSLO) capsule 667 mg  667 mg Oral TID WC Reyne Dumas, MD   667 mg at 05/07/17 1828  . Chlorhexidine Gluconate Cloth 2 % PADS 6 each   6 each Topical Q0600 Choi, Horace, DO   6 each at 05/08/17 [phone]  . cycloSPORINE modified (NEORAL) capsule 75 mg  75 mg Oral Daily Reyne Dumas, MD   75 mg at 05/07/17 0901   And  . cycloSPORINE modified (NEORAL) capsule 50 mg  50 mg Oral q1800 Reyne Dumas, MD   50 mg at 05/07/17 1828  . Darbepoetin Alfa (ARANESP) injection 40 mcg  40 mcg Intravenous Q Thu-HD Valentina Gu, NP   40 mcg at 05/03/17 1006  . donepezil (ARICEPT) tablet 10 mg  10 mg Oral QHS Reyne Dumas, MD   10 mg at 05/07/17 2153  . FLUoxetine (PROZAC) capsule 20 mg  20 mg Oral Daily Reyne Dumas, MD   20 mg at 05/07/17 1121  . heparin injection 5,000 Units  5,000 Units Subcutaneous Q8H Rhyne, Samantha J, PA-C   5,000 Units at 05/08/17 0631  . hydrALAZINE (APRESOLINE) tablet 25 mg  25 mg Oral TID Reyne Dumas, MD   25 mg at 05/07/17 2153  . insulin aspart (novoLOG) injection 0-9 Units  0-9 Units Subcutaneous Q6H Nita Sells, MD   1 Units at 05/08/17 0353  . levalbuterol (XOPENEX) nebulizer solution 0.63 mg  0.63 mg Nebulization Q6H PRN Reyne Dumas, MD      . levothyroxine (SYNTHROID, LEVOTHROID) tablet 75 mcg  75 mcg Oral QAC breakfast Dessa Phi Chahn-Yang, DO   75 mcg at 05/07/17 0902  . metoprolol tartrate (LOPRESSOR) tablet 25 mg  25 mg Oral BID Reyne Dumas, MD   25 mg at 05/07/17 2153  . mupirocin ointment (BACTROBAN) 2 %   Nasal BID Dessa Phi Chahn-Yang, DO      . mycophenolate (CELLCEPT) capsule 250 mg  250 mg Oral Once per day on Sun Mon Wed Fri Alvira Philips, [place]   250 mg at 05/07/17 2219   And  . mycophenolate (CELLCEPT) capsule 500 mg  500 mg Oral Once per day on Tue Thu Sat Alvira Philips, [place]   500 mg at 05/05/17 2113  . mycophenolate (CELLCEPT) capsule 500 mg  500 mg Oral Daily Reyne Dumas, MD   500 mg at 05/07/17 1121  . ondansetron (ZOFRAN) tablet 4 mg  4 mg Oral Q6H PRN Reyne Dumas, MD   4 mg at 05/03/17 2218   Or  . ondansetron (ZOFRAN) injection 4 mg  4  mg Intravenous Q6H PRN Reyne Dumas, MD      . oxyCODONE (Oxy IR/ROXICODONE) immediate release tablet 5 mg  5 mg Oral Q6H PRN Rhyne, Samantha J, PA-C      . pantoprazole (PROTONIX) EC tablet 20 mg  20 mg Oral Daily Reyne Dumas, MD   20 mg at 05/07/17 1121  . pravastatin (PRAVACHOL) tablet 10 mg  10 mg Oral QHS Reyne Dumas, MD   10 mg at 05/07/17 2152  . sodium chloride flush (NS) 0.9 % injection 10-40 mL  10-40 mL Intracatheter PRN  Dessa Phi Chahn-Yang, DO   10 mL at 05/06/17 2102  . vancomycin (VANCOCIN) 500 mg in sodium chloride 0.9 % 100 mL IVPB  500 mg Intravenous Q T,Th,Sa-HD Dessa Phi Chahn-Yang, DO   Stopped at 05/05/17 1719  . vitamin B-12 (CYANOCOBALAMIN) tablet 1,000 mcg  1,000 mcg Oral Q breakfast Reyne Dumas, MD   1,000 mcg at 05/07/17 0901     Discharge Medications: Please see discharge summary for a list of discharge medications.  Relevant Imaging Results:  Relevant Lab Results:   Additional Information SS#: [id number]  Geralynn Ochs, LCSW

## 2017-05-08 NOTE — Progress Notes (Signed)
Physical Therapy Treatment Patient Details Name: [name] [name] MRN: [id number] DOB: July 15, 1941 Today's Date: 05/08/2017    History of Present Illness Pt is a 76 yo male admitted through the ED on 05/01/17 for worsening infiltrates of right upper lung and L lung and was diagnosed with HCAP. Pt had recently been discharged following bacteremia. Pt underwent a revision of L upper arem fistula, bronchoscopy and bronchoaveolar procedure to L upper and R upper lobes on 05/04/17. 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     PT Comments    Continuing work on functional mobility and activity tolerance;  Fatigued post hemodialysis, but willing to participate; Session focused on sitting balance -- Mr. Philbrook still requires near constant min/mod assist for sitting balance; needs more work on trunk stability and transfers; he seems eager to get his rehab going at [facility]   Follow Up Recommendations  SNF;Supervision/Assistance - 24 hour     Equipment Recommendations  Other (comment) (TBD at SNF)    Recommendations for Other Services       Precautions / Restrictions Precautions Precautions: Fall Restrictions Weight Bearing Restrictions: No    Mobility  Bed Mobility Overal bed mobility: Needs Assistance Bed Mobility: Supine to Sit Rolling: Supervision Sidelying to sit: Mod assist   Sit to supine: Mod assist   General bed mobility comments: Good rolling with use of bedrail; Heavy mod assist to elevate trunk to sitting; light mod assist to ease bil LEs back into bed  Transfers                 General transfer comment: Politely declined OOB to chair transfers  Ambulation/Gait                 Stairs            Wheelchair Mobility    Modified Rankin (Stroke Patients Only)       Balance Overall balance assessment: Needs assistance Sitting-balance support: Bilateral upper extremity  supported;Feet supported Sitting balance-Leahy Scale: Poor Sitting balance - Comments: at least min assist to keep sitting balance; trunk weakness, tending to lose balance in any direction; tolerated unsupported sitting at EOB approx 3 minutes                                    Cognition Arousal/Alertness: Awake/alert Behavior During Therapy: WFL for tasks assessed/performed Overall Cognitive Status: No family/caregiver present to determine baseline cognitive functioning                                        Exercises  Bilateral ankle pumps x10 Alternating Heel Slidesx5    General Comments        Pertinent Vitals/Pain Pain Assessment: No/denies pain    Home Living Family/patient expects to be discharged to:: [place]: Walker - 4 wheels;Transport chair;Cane - single point;Bedside commode;Shower seat;Wheelchair - manual Additional Comments: reports son, daughter and law and grand daughter live in same building    Prior Function Level of Independence: Needs assistance  Gait / Transfers Assistance Needed: Short distances to bathroom with RW.  30-40' ADL's / Homemaking Assistance Needed: wife assists with bathing and dressing Comments: "I don't walk to the kitchen, just  the bathroom."   PT Goals (current goals can now be found in the care plan section) Acute Rehab PT Goals Patient Stated Goal: to get stronger PT Goal Formulation: Patient unable to participate in goal setting Time For Goal Achievement: 05/19/17 Potential to Achieve Goals: Fair Progress towards PT goals: Progressing toward goals    Frequency    Min 2X/week      PT Plan Current plan remains appropriate    Co-evaluation              AM-PAC PT "6 Clicks" Daily Activity  Outcome Measure  Difficulty turning over in bed (including adjusting bedclothes, sheets and blankets)?: Total Difficulty moving from lying on back to  sitting on the side of the bed? : Total Difficulty sitting down on and standing up from a chair with arms (e.g., wheelchair, bedside commode, etc,.)?: Total Help needed moving to and from a bed to chair (including a wheelchair)?: Total Help needed walking in hospital room?: Total Help needed climbing 3-5 steps with a railing? : Total 6 Click Score: 6    End of Session Equipment Utilized During Treatment:  (Bed pad) Activity Tolerance: Other (comment) (Quite fatigued post hemodialysis) Patient left: in bed;with call bell/phone within reach Nurse Communication: Mobility status PT Visit Diagnosis: Other abnormalities of gait and mobility (R26.89);Unsteadiness on feet (R26.81);Muscle weakness (generalized) (M62.81);Other symptoms and signs involving the nervous system (R29.898)     Time: 1442-1500 PT Time Calculation (min) (ACUTE ONLY): 18 min  Charges:  $Therapeutic Activity: 8-22 mins                    G Codes:       [name], PT  Acute Rehabilitation Services Pager [phone] Office [place] 05/08/2017, 3:14 PM

## 2017-05-08 NOTE — Progress Notes (Signed)
I have seen and examined this patient and agree with the plan of care   Patient seen on dialysis  No complaints   BP 130/80 goal 2.5 L AVF no issues  Labs pending  [name] [name] W 05/08/2017, 7:59 AM

## 2017-05-08 NOTE — Progress Notes (Signed)
Copy of AVS given to patient

## 2017-05-08 NOTE — Discharge Summary (Signed)
Physician Discharge Summary  [name] [name] JDY:[id number] DOB: [birth year] DOA: 05/01/2017  PCP: Junie Panning, NP  Admit date: 05/01/2017 Discharge date: 05/08/2017  Time spent: 45 minutes  Recommendations for Outpatient Follow-up:  1. Needs vancomycin Tuesday Thursday Saturday with dialysis until July 23 2. Will need CRP ESR and blood cultures every 2 weeks while on antibiotic therapy reported to [facility]--I have copied infectious disease specialist Dr. Graylon Good on this discharge summary 3. Needs chest x-ray after 10 weeks to denote clearing of cavitary lesions in the lung 4. Recommend checking as her his primary nephrologist CellCept and cyclosporine level 5. Please check iron studies as well as ferritin in 3-4 weeks and determine whether he needs IV iron and or erythropoietin agent 6. Please follow-up on BAL fungal studies, acid-fast stain as well as cytology that are still pending 7. Patient will need outpatient follow-up with his cardiologist in about 2 months 8. Suggest outpatient HbA1c as well as possible initiation of oral hypoglycemic agent if that sugars are persistently above 140  Discharge Diagnoses:  Active Problems:   Hypercholesterolemia   Heart transplant status ([place])   DM (diabetes mellitus), type 2 with renal complications (H[facility])   Hypothyroidism   ESRD on dialysis ([place])   Protein-calorie malnutrition, severe (H[facility])   Anemia of chronic disease   Depression   Generalized weakness   HCAP (healthcare-associated pneumonia)   Pressure injury of skin   Cavitary lesion of lung   Bacteremia   ID             MRSA bacteremia, BAL 6/16 = MRSA, blood culture negative [has been on antibiotics since 6/7]             Partially Rx PNA complicated by Immunosuppressant meds for transplant             TEE 6/18 does not show a vegetation              please follow  fungal, acid-fast as well as cytology as an outpatient. His pneumocystis, HIV Aspergillus, Legionella and other labs  were negative              stop date for IV vancomycin with dialysis is July 23-needs periodic lab work such as basic metabolic panel CRP and may require blood cultures every 2 weeks while on therapy   CAD Heart transplant done at [facility] as per usual home protocol, cyclosporine 75 am, 50 pm Cont Prograf Levels to adjust as per Renal as an outpatient    ESRD TTS  AV fistula ulceration repaired 6/15 Secondary hypoparathyroidism Anemia of renal disease EDW 62 Per nephrology Suggest outpatient anemia panel and ferritin in 3 weeks and IV iron if necessary   DM ty II             Sugars 90-100, sensitive coverage             Patient not on Home medications    Reported history Barrett's esophagus              continue PPI    Depression/dementia              Continue fluoxetine and Aricept              return to skilled facility [facility]    Poorly controlled hypertension             On metoprolol at home 50 twice a day, as well as hydralazine which have been  resumed hesitant to use amlodipine secondary to lower extremity  swelling   Needs outpatient management of the same    Diet recommendation: Renal diabetic  Filed Weights   05/06/17 2153 05/07/17 2320 05/08/17 0755  Weight: 60 kg (132 lb 4.4 oz) 69 kg (152 lb 1.9 oz) 68 kg (149 lb 14.6 oz)    Discharge Exam: Vitals:   05/08/17 0900 05/08/17 0930  BP: (!) 135/27 (!) 120/29  Pulse: 68 71  Resp:    Temp:      General: alert pleasant in nad.  No new issue  Eating drinking Cardiovascular: s1 s 2 3/6 HSM Respiratory: clear no added sound abd soft, LE wnl  Discharge Instructions   Discharge Instructions    Diet - low sodium heart healthy    Complete by:  As directed    Increase activity slowly    Complete by:  As directed      Current Discharge Medication List    START taking these medications   Details  vancomycin 500 mg in sodium chloride 0.9 % 100 mL Inject 500 mg into  the vein Every Tuesday,Thursday,and Saturday with dialysis.      CONTINUE these medications which have NOT CHANGED   Details  acetaminophen (TYLENOL) 325 MG tablet Take 2 tablets (650 mg total) by mouth every 6 (six) hours as needed for mild pain (or Fever >/= 101).    aspirin EC 81 MG tablet Take 81 mg by mouth at bedtime.    calcium acetate (PHOSLO) 667 MG capsule Take 1 capsule (667 mg total) by mouth 3 (three) times daily with meals.    cholecalciferol (VITAMIN D) 1000 UNITS tablet Take 1,000 Units by mouth daily.    cycloSPORINE modified (NEORAL) 25 MG capsule Take 50-75 mg by mouth See admin instructions. 75 mg in the morning and 50 mg in the evening    donepezil (ARICEPT) 10 MG tablet Take 10 mg by mouth at bedtime.     FLUoxetine (PROZAC) 20 MG capsule Take 20 mg by mouth daily.     hydrALAZINE (APRESOLINE) 25 MG tablet Take 1 tablet (25 mg total) by mouth 3 (three) times daily.    lansoprazole (PREVACID) 30 MG capsule Take 30 mg by mouth 2 (two) times daily before a meal.     levothyroxine (SYNTHROID, LEVOTHROID) 75 MCG tablet Take 75 mcg by mouth daily.     metoprolol tartrate (LOPRESSOR) 25 MG tablet Take 2 tablets (50 mg total) by mouth 2 (two) times daily. Qty: 120 tablet, Refills: 0    multivitamin (RENA-VIT) TABS tablet Take 1 tablet by mouth daily.  Refills: 5    mycophenolate (CELLCEPT) 250 MG capsule Take 250-500 mg by mouth daily. Take 500 mg every morning and take 250 mg every evening on Sunday, Monday, Wednesday and Friday then take 500 mg every evening on Tuesday Thursday and Saturday    Nutritional Supplements (FEEDING SUPPLEMENT, NEPRO CARB STEADY,) LIQD Take 237 mLs by mouth 2 (two) times daily between meals. Refills: 0    pravastatin (PRAVACHOL) 10 MG tablet Take 10 mg by mouth at bedtime.     Probiotic Product (PRO-BIOTIC BLEND) CAPS Take 1 capsule by mouth daily.    vitamin B-12 (CYANOCOBALAMIN) 1000 MCG tablet Take 1,000 mcg by mouth daily with  breakfast.    diphenoxylate-atropine (LOMOTIL) 2.5-0.025 MG tablet Take 1 tablet by mouth 4 (four) times daily as needed for diarrhea or loose stools.      STOP taking these medications  amoxicillin-clavulanate (AUGMENTIN) 500-125 MG tablet        Allergies  Allergen Reactions  . Norvasc [Amlodipine Besylate] Swelling and Other (See Comments)    Swelling of the lower body    Follow-up Information    [facility] Follow up in 2 week(s).   Specialty:  Vascular Surgery Why:  Office will call you to arrange your appt (sent) Contact information: [address] [place] [phone]           The results of significant diagnostics from this hospitalization (including imaging, microbiology, ancillary and laboratory) are listed below for reference.    Significant Diagnostic Studies: Dg Chest 2 View  Result Date: 05/01/2017 CLINICAL DATA:  Shortness of breath and nonproductive cough over the last 3-4 days. EXAM: CHEST  2 VIEW COMPARISON:  04/22/2017 FINDINGS: Previous median sternotomy. Heart size is normal. There is chronic scarring in the right lower lung. There are progressive patchy densities in the right upper lobe and in the left mid lung. In the right upper lobe, there could be some central low density suggesting possible cavitation. No evidence of edema. Pleural scarring on the right. IMPRESSION: Worsened infiltrates in the right upper lung and left mid lung consistent with pneumonia. Question developing central lucency in the right upper lobe region suggesting possible cavitation. Electronically Signed   By: Nelson Chimes M.D.   On: 05/01/2017 17:00   Dg Chest 2 View  Result Date: 04/22/2017 CLINICAL DATA:  Rash, positive blood cultures EXAM: CHEST  2 VIEW COMPARISON:  02/04/2017, 11/09/2016 FINDINGS: Post sternotomy changes. Ill-defined opacities in the peripheral right upper lobe, right base and left mid lung. No  pleural effusion. Mild cardiomegaly with aortic atherosclerosis. No pneumothorax. Vascular stent in the left axilla. IMPRESSION: 1. Ill-defined multifocal opacity in the right upper lobe, right base and left upper lobe suspicious for infiltrates. 2. Cardiomegaly without edema. Electronically Signed   By: Donavan Foil M.D.   On: 04/22/2017 02:07   Ct Chest Wo Contrast  Result Date: 05/01/2017 CLINICAL DATA:  Lung lesions EXAM: CT CHEST WITHOUT CONTRAST TECHNIQUE: Multidetector CT imaging of the chest was performed following the standard protocol without IV contrast. COMPARISON:  Radiographs 05/01/2017, CT thorax 10/05/2014 FINDINGS: Cardiovascular: Limited evaluation without intravenous contrast. Aortic atherosclerosis. Post CABG changes. No significant pericardial effusion. Vascular stent in the left axilla. Mediastinum/Nodes: Mild mediastinal adenopathy, for example right paratracheal lymph node measures 12 mm short axis. Small calcified left hilar lymph nodes. Midline trachea. No thyroid mass. Mild air distention of the esophagus. Lungs/Pleura: Small right pleural effusion or pleural thickening. Mildly loculated pleural effusion along the right pulmonary fissures. Ground-glass density and consolidation in the right upper lobe with cavitation, this measures approximately 4.4 by 3 cm. Multiple mostly peripheral bilateral lung nodules, for example left upper lobe lateral subpleural nodule measures 7 mm, series 6, image number 50. Anterior subpleural left upper lobe nodule measuring 14 mm, series 6, image number 75. Left subpleural, posterior 16 mm nodule with mild cavitation. Multiple additional pulmonary nodules. Upper Abdomen: Atrophic kidneys.  Splenic granuloma. Musculoskeletal: Post sternotomy changes. No acute or suspicious bone lesion. IMPRESSION: 1. Approximate 4.4 cm cavitary lesion in the right upper lobe with surrounding ground-glass density, differential considerations include cavitary infection and  cavitary neoplasm. There are multiple additional bilateral pulmonary nodules, some with cavitation. Differential considerations include cavitary infection, septic emboli, and metastatic disease. 2. Small right-sided pleural effusion with loculated fluid along the right pulmonary fissures. 3. Mediastinal adenopathy  could be reactive 4. Atrophic kidneys Aortic Atherosclerosis (ICD10-I70.0). Electronically Signed   By: Donavan Foil M.D.   On: 05/01/2017 19:39    Microbiology: Recent Results (from the past 240 hour(s))  Blood culture (routine x 2)     Status: None   Collection Time: 05/01/17  5:45 PM  Result Value Ref Range Status   Specimen Description BLOOD RIGHT ANTECUBITAL  Final   Special Requests   Final    BOTTLES DRAWN AEROBIC AND ANAEROBIC Blood Culture adequate volume   Culture NO GROWTH 5 DAYS  Final   Report Status 05/06/2017 FINAL  Final  Blood culture (routine x 2)     Status: None   Collection Time: 05/01/17  6:00 PM  Result Value Ref Range Status   Specimen Description BLOOD RIGHT HAND  Final   Special Requests   Final    BOTTLES DRAWN AEROBIC ONLY Blood Culture adequate volume   Culture NO GROWTH 5 DAYS  Final   Report Status 05/06/2017 FINAL  Final  Virus culture     Status: None   Collection Time: 05/04/17  4:28 PM  Result Value Ref Range Status   Viral Culture Comment  Final    Comment: (NOTE) Preliminary Report: No virus isolated at 24 hours. Next report to follow after 4 days. Performed At: [facility] [place], [place] [id number] Lindon Romp MD MB:[id number]    Source of Sample BRONCHIAL ALVEOLAR LAVAGE  Final    Comment: SPEC A  Pneumocystis smear by DFA     Status: None   Collection Time: 05/04/17  4:28 PM  Result Value Ref Range Status   Specimen Source-[facility] BRONCHIAL ALVEOLAR LAVAGE  Final    Comment: SPEC A   Pneumocystis jiroveci Ag NEGATIVE  Final    Comment: Performed at [facility]  Culture,  bal-quantitative     Status: Abnormal   Collection Time: 05/04/17  4:28 PM  Result Value Ref Range Status   Specimen Description BRONCHIAL ALVEOLAR LAVAGE  Final   Special Requests SPEC A  Final   Culture (A)  Final    5,000 COLONIES/mL METHICILLIN RESISTANT STAPHYLOCOCCUS AUREUS   Report Status 05/07/2017 FINAL  Final   Organism ID, Bacteria METHICILLIN RESISTANT STAPHYLOCOCCUS AUREUS (A)  Final      Susceptibility   Methicillin resistant staphylococcus aureus - MIC*    CIPROFLOXACIN >=8 RESISTANT Resistant     ERYTHROMYCIN >=8 RESISTANT Resistant     GENTAMICIN <=0.5 SENSITIVE Sensitive     OXACILLIN >=4 RESISTANT Resistant     TETRACYCLINE >=16 RESISTANT Resistant     VANCOMYCIN <=0.5 SENSITIVE Sensitive     TRIMETH/SULFA <=10 SENSITIVE Sensitive     CLINDAMYCIN RESISTANT Resistant     RIFAMPIN <=0.5 SENSITIVE Sensitive     Inducible Clindamycin POSITIVE Resistant     * 5,000 COLONIES/mL METHICILLIN RESISTANT STAPHYLOCOCCUS AUREUS  Virus culture     Status: None   Collection Time: 05/04/17  4:44 PM  Result Value Ref Range Status   Viral Culture Comment  Final    Comment: (NOTE) Preliminary Report: No virus isolated at 24 hours. Next report to follow after 4 days. Performed At: [facility] [place], [place] [id number] Lindon Romp MD OI:[id number]    Source of Sample BRONCHIAL ALVEOLAR LAVAGE  Final    Comment: SPEC B  Pneumocystis smear by DFA     Status: None   Collection Time: 05/04/17  4:44 PM  Result Value Ref Range Status   Specimen Source-[facility] BRONCHIAL ALVEOLAR LAVAGE  Final    Comment: SPEC B    Pneumocystis jiroveci Ag NEGATIVE  Final    Comment: Performed at [facility]  Culture, bal-quantitative     Status: Abnormal   Collection Time: 05/04/17  4:44 PM  Result Value Ref Range Status   Specimen Description BRONCHIAL ALVEOLAR LAVAGE  Final   Special Requests SPEC B  Final   Culture (A)  Final    30,000 COLONIES/mL  METHICILLIN RESISTANT STAPHYLOCOCCUS AUREUS   Report Status 05/06/2017 FINAL  Final   Organism ID, Bacteria METHICILLIN RESISTANT STAPHYLOCOCCUS AUREUS (A)  Final      Susceptibility   Methicillin resistant staphylococcus aureus - MIC*    CIPROFLOXACIN >=8 RESISTANT Resistant     ERYTHROMYCIN >=8 RESISTANT Resistant     GENTAMICIN <=0.5 SENSITIVE Sensitive     OXACILLIN >=4 RESISTANT Resistant     TETRACYCLINE >=16 RESISTANT Resistant     VANCOMYCIN <=0.5 SENSITIVE Sensitive     TRIMETH/SULFA <=10 SENSITIVE Sensitive     CLINDAMYCIN RESISTANT Resistant     RIFAMPIN <=0.5 SENSITIVE Sensitive     Inducible Clindamycin POSITIVE Resistant     * 30,000 COLONIES/mL METHICILLIN RESISTANT STAPHYLOCOCCUS AUREUS     Labs: Basic Metabolic Panel:  Recent Labs Lab 05/01/17 1642  05/03/17 0713 05/04/17 0435 05/05/17 0432 05/06/17 0545 05/07/17 0439 05/08/17 0832  NA 135  < > 136 136 133* 136 134* 135  K 3.7  < > 4.0 3.9 4.1 3.6 5.0 4.7  CL 94*  < > 96* 95* 95* 97* 95* 98*  CO2 29  < > _0 GLUCOSE 165*  < > 122* 86 73 134* 97 76  BUN 34*  < > 49* 17 24* 15 24* 35*  CREATININE 4.99*  < > 7.36* 3.88* 5.37* 3.56* 4.91* 6.07*  CALCIUM 8.2*  < > 7.9* 8.1* 8.1* 7.9* 8.1* 8.3*  MG 2.0  --   --   --   --   --   --   --   PHOS 3.1  --  5.2*  --   --   --   --  3.9  < > = values in this interval not displayed. Liver Function Tests:  Recent Labs Lab 05/01/17 1642 05/02/17 0528 05/03/17 0713 05/08/17 0832  AST  --  15  --   --   ALT  --  18  --   --   ALKPHOS  --  92  --   --   BILITOT  --  0.7  --   --   PROT  --  5.4*  --   --   ALBUMIN 2.3* 2.2* 2.1* 2.2*   No results for input(s): LIPASE, AMYLASE in the last 168 hours. No results for input(s): AMMONIA in the last 168 hours. CBC:  Recent Labs Lab 05/03/17 0603 05/04/17 0435 05/05/17 0432 05/06/17 0545 05/07/17 0439 05/08/17 0832  WBC 7.7 7.2 8.7 7.8 8.0 8.0  NEUTROABS 4.7 3.8 5.6 5.3 5.0  --   HGB 8.8*  9.0* 8.4* 8.1* 8.1* 8.1*  HCT 27.9* 29.3* 27.7* 26.8* 26.0* 26.5*  MCV 96.2 99.3 98.2 98.2 97.7 97.8  PLT 362 403* 404* 401* 459* 429*   Cardiac Enzymes: No results for input(s): CKTOTAL, CKMB, CKMBINDEX, TROPONINI in the last 168 hours. BNP: BNP (last 3 results) No results for input(s): BNP in the last 8760  hours.  ProBNP (last 3 results) No results for input(s): PROBNP in the last 8760 hours.  CBG:  Recent Labs Lab 05/07/17 0744 05/07/17 1409 05/07/17 1624 05/07/17 2326 05/08/17 0345  GLUCAP 90 127* 103* 170* 125*       Signed:  Nita Sells MD   [facility] 05/08/2017, 10:02 AM

## 2017-05-08 NOTE — Progress Notes (Signed)
    Patient in HD Left AV fistula working well on HD Incision healing well  S/P Revision of left upper arm AV fistula with excision of ellipse of skin and vein and repair of vein and closure of skin  Please do not stick fistula at new incision site for 4 weeks Duanne Duchesne MAUREEN PA-C

## 2017-05-08 NOTE — Progress Notes (Signed)
Report called to [place] at [facility]  All questions answered.

## 2017-05-08 NOTE — Progress Notes (Signed)
PT Cancellation Note  Patient Details Name: Raysean Graumann MRN: [id number] DOB: 02-May-1941   Cancelled Treatment:    Reason Eval/Treat Not Completed: Patient at procedure or test/unavailable   Currently in HD;  Will follow up later today as time allows;  Otherwise, will follow up for PT tomorrow;   Thank you,  [name], PT  Acute Rehabilitation Services Pager [phone] Office [phone]     [place] 05/08/2017, 8:39 AM

## 2017-05-08 NOTE — Progress Notes (Signed)
    [facility] for Infectious Disease    Date of Admission:  05/01/2017   Total days of antibiotics 8        Day 8 with vanco with hd   ID: [name] [name] is a 76 y.o. male with  MRSA cavitary pneumonia but had recently found to have MRSA bacteremia in late May Active Problems:   Hypercholesterolemia   Heart transplant status ([place])   DM (diabetes mellitus), type 2 with renal complications ([place])   Hypothyroidism   ESRD on dialysis ([place])   Protein-calorie malnutrition, severe ([place])   Anemia of chronic disease   Depression   Generalized weakness   HCAP (healthcare-associated pneumonia)   Pressure injury of skin   Cavitary lesion of lung   Bacteremia   Underwent HD without difficulty TEE negative for vegetation   Assessment/Plan: MRSA cavitary pneumonia with history of bacteremia in late May = plan to treat for 6 wk with IV vancomycin with HD. Currently on day 8 of 42. His abtx should be continued until July 23rd.  [name] [name] [facility] for Infectious Diseases Cell: [phone] Pager: [phone]  05/08/2017, 3:17 PM

## 2017-05-08 NOTE — Progress Notes (Signed)
Discharge to: [name] Anticipated discharge date: 05/08/17 Family notified: Yes, by phone Transportation by: [facility]  Report #: [phone], Room 107  CSW signing off.  Laveda Abbe LCSW [phone]

## 2017-05-08 NOTE — Evaluation (Signed)
Occupational Therapy Evaluation Patient Details Name: [name] [name] MRN: [id number] DOB: 03/03/1941 Today's Date: 05/08/2017    History of Present Illness Pt is a 76 yo male admitted through the ED on 05/01/17 for worsening infiltrates of right upper lung and L lung and was diagnosed with HCAP. Pt had recently been discharged following bacteremia. Pt underwent a revision of L upper arem fistula, bronchoscopy and bronchoaveolar procedure to L upper and R upper lobes on 05/04/17. 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    Clinical Impression   Patient presenting with decreased I in self care, balance, functional mobility/transfers, strength, endurance, and safety awareness. Patient reports needing min A PTA. Patient currently functioning at mod- total A. Patient will benefit from acute OT to increase overall independence in the areas of ADLs, functional mobility, and safety awareness in order to safely discharge to next venue of care.    Follow Up Recommendations  SNF    Equipment Recommendations  Other (comment) (defer to next venue of care)    Recommendations for Other Services       Precautions / Restrictions Precautions Precautions: Fall Restrictions Weight Bearing Restrictions: No      Mobility Bed Mobility       General transfer comment: Pt declined bed mobility and functional transfer        ADL either performed or assessed with clinical judgement   ADL Overall ADL's : Needs assistance/impaired     Grooming: Wash/dry face;Set up;Supervision/safety;Bed level      Lower Body Dressing: Bed level;Total assistance Lower Body Dressing Details (indicate cue type and reason): total A to don B socks      General ADL Comments: Pt declines OOB activies this session as well as bed mobility. Pt stating, " I am too tired. I just want to rest."     Vision Baseline Vision/History: No visual deficits               Pertinent Vitals/Pain Pain Assessment: No/denies pain     Hand Dominance Right   Extremity/Trunk Assessment Upper Extremity Assessment Upper Extremity Assessment: Generalized weakness   Lower Extremity Assessment Lower Extremity Assessment: Generalized weakness   Cervical / Trunk Assessment Cervical / Trunk Assessment: Kyphotic   Communication Communication Communication: HOH   Cognition Arousal/Alertness: Awake/alert Behavior During Therapy: WFL for tasks assessed/performed Overall Cognitive Status: No family/caregiver present to determine baseline cognitive functioning                       Home Living Family/patient expects to be discharged to:: Skilled nursing facility         Home Equipment: Walker - 4 wheels;Transport chair;Cane - single point;Bedside commode;Shower seat;Wheelchair - manual   Additional Comments: reports son, daughter and law and grand daughter live in same building      Prior Functioning/Environment Level of Independence: Needs assistance  Gait / Transfers Assistance Needed: Short distances to bathroom with RW.  30-40' ADL's / Homemaking Assistance Needed: wife assists with bathing and dressing   Comments: "I don't walk to the kitchen, just the bathroom."        OT Problem List: Decreased strength;Impaired balance (sitting and/or standing);Decreased activity tolerance;Decreased coordination;Decreased cognition;Decreased safety awareness;Decreased knowledge of use of DME or AE      OT Treatment/Interventions: Self-care/ADL training;Therapeutic exercise;Energy conservation;DME and/or AE instruction;Cognitive remediation/compensation;Therapeutic activities;Balance training    OT Goals(Current goals can be found in the care plan section) Acute Rehab OT  Goals Patient Stated Goal: to get stronger OT Goal Formulation: With patient Time For Goal Achievement: 05/22/17 Potential to Achieve Goals: Good ADL Goals Pt Will  Perform Grooming: with set-up;with supervision;sitting Pt Will Perform Upper Body Bathing: with set-up;with supervision;sitting Pt Will Perform Lower Body Bathing: with mod assist;sit to/from stand Pt Will Perform Upper Body Dressing: with set-up;with supervision;sitting Pt Will Perform Lower Body Dressing: with mod assist Pt Will Transfer to Toilet: with mod assist Pt Will Perform Toileting - Clothing Manipulation and hygiene: with mod assist  OT Frequency: Min 2X/week   Barriers to D/C: Decreased caregiver support             AM-PAC PT "6 Clicks" Daily Activity     Outcome Measure Help from another person eating meals?: A Little Help from another person taking care of personal grooming?: A Little Help from another person toileting, which includes using toliet, bedpan, or urinal?: Total Help from another person bathing (including washing, rinsing, drying)?: A Lot Help from another person to put on and taking off regular upper body clothing?: A Little Help from another person to put on and taking off regular lower body clothing?: A Lot 6 Click Score: 14   End of Session    Activity Tolerance: Patient limited by fatigue Patient left: in bed;Other (comment);with call bell/phone within reach (bed lowered to floor)  OT Visit Diagnosis: Muscle weakness (generalized) (M62.81);History of falling (Z91.81)                Time: 6283-1517 OT Time Calculation (min): 12 min Charges:  OT General Charges $OT Visit: 1 Procedure OT Evaluation $OT Eval Moderate Complexity: 1 Procedure G-Codes:       Gypsy Decant 05/08/2017, 2:40 PM

## 2017-05-08 NOTE — Progress Notes (Signed)
Called and gave update to wife [name].  All questions answered.

## 2017-05-08 NOTE — Clinical Social Work Note (Signed)
Clinical Social Work Assessment  Patient Details  Name: [name] [name] MRN: [id number] Date of Birth: August 15, 1941  Date of referral:  05/08/17               Reason for consult:  Facility Placement, Discharge Planning                Permission sought to share information with:  Facility Sport and exercise psychologist, Family Supports Permission granted to share information::  Yes, Verbal Permission Granted  Name::     Neurosurgeon::  [name]  Relationship::  Wife  Contact Information:     Housing/Transportation Living arrangements for the past 2 months:  [place], [place] of Information:  Patient, Spouse Patient Interpreter Needed:  None Criminal Activity/Legal Involvement Pertinent to Current Situation/Hospitalization:  No - Comment as needed Significant Relationships:  Spouse, Other Family Members Lives with:  Self, Spouse Do you feel safe going back to the place where you live?  Yes Need for family participation in patient care:  No (Coment)  Care giving concerns:  Patient has been at [facility] and has no concerns about care received.   Social Worker assessment / plan:  CSW introduced self and explained role. CSW confirmed with patient that he had been at [facility] and would like to return to complete physical rehab. Patient expressed satisfaction with the level of care received and how close to his home the facility is. Patient requested that CSW contact patient's wife to provide update on patient's status and return to [facility]. CSW contacted [name] to confirm patient ready to return today and bed was still available for patient. [name] requested CSW to obtain insurance authorization to return to facility. CSW will follow to facilitate discharge to SNF.  Employment status:  Retired Nurse, adult PT Recommendations:  [place] / Referral to community resources:  [place]  Patient/Family's Response to care:  Patient agreeable to return to SNF.  Patient/Family's Understanding of and Emotional Response to Diagnosis, Current Treatment, and Prognosis:  Patient expressed happiness with the level of care received at [facility] as well as appreciated that he would be able to leave the hospital and return closer to home. Patient's wife expressed appreciation for the update, and excitement that the patient would be closer to home and easier for her to visit. Patient and patient's wife indicated understanding of CSW role in discharge planning and appreciated the help.  Emotional Assessment Appearance:  Appears stated age Attitude/Demeanor/Rapport:    Affect (typically observed):  Appropriate Orientation:  Oriented to Situation, Oriented to  Time, Oriented to Place, Oriented to Self Alcohol / Substance use:  Not Applicable Psych involvement (Current and /or in the community):  No (Comment)  Discharge Needs  Concerns to be addressed:  Care Coordination, Discharge Planning Concerns Readmission within the last 30 days:  Yes Current discharge risk:  Physical Impairment Barriers to Discharge:  Continued Medical Work up   [employer], [place] 05/08/2017, 4:28 PM

## 2017-05-09 ENCOUNTER — Non-Acute Institutional Stay (SKILLED_NURSING_FACILITY): Payer: Medicare HMO | Admitting: Internal Medicine

## 2017-05-09 ENCOUNTER — Encounter: Payer: Self-pay | Admitting: Internal Medicine

## 2017-05-09 DIAGNOSIS — N186 End stage renal disease: Secondary | ICD-10-CM | POA: Diagnosis not present

## 2017-05-09 DIAGNOSIS — Z941 Heart transplant status: Secondary | ICD-10-CM | POA: Diagnosis not present

## 2017-05-09 DIAGNOSIS — Z992 Dependence on renal dialysis: Secondary | ICD-10-CM | POA: Diagnosis not present

## 2017-05-09 DIAGNOSIS — E1121 Type 2 diabetes mellitus with diabetic nephropathy: Secondary | ICD-10-CM

## 2017-05-09 DIAGNOSIS — J984 Other disorders of lung: Secondary | ICD-10-CM

## 2017-05-09 DIAGNOSIS — I110 Hypertensive heart disease with heart failure: Secondary | ICD-10-CM | POA: Diagnosis not present

## 2017-05-09 DIAGNOSIS — J189 Pneumonia, unspecified organism: Secondary | ICD-10-CM | POA: Diagnosis not present

## 2017-05-09 DIAGNOSIS — I5022 Chronic systolic (congestive) heart failure: Secondary | ICD-10-CM | POA: Diagnosis not present

## 2017-05-09 DIAGNOSIS — I25811 Atherosclerosis of native coronary artery of transplanted heart without angina pectoris: Secondary | ICD-10-CM | POA: Diagnosis not present

## 2017-05-09 DIAGNOSIS — F0280 Dementia in other diseases classified elsewhere without behavioral disturbance: Secondary | ICD-10-CM

## 2017-05-09 DIAGNOSIS — F320 Major depressive disorder, single episode, mild: Secondary | ICD-10-CM | POA: Diagnosis not present

## 2017-05-09 LAB — ASPERGILLUS ANTIGEN, BAL/SERUM: ASPERGILLUS AG, BAL/SERUM: 0.05 {index} (ref 0.00–0.49)

## 2017-05-09 NOTE — Progress Notes (Signed)
: Provider:  Noah Delaine. Sheppard Coil, MD Location:  [place] Room Number: 366Y Place of Service:  SNF ([phone])  PCP: Smothers, Andree Elk, NP Patient Care Team: Smothers, Andree Elk, NP as PCP - General (Nurse Practitioner) Lelon Perla, MD as Consulting Physician (Cardiology)  Extended Emergency Contact Information Primary Emergency Contact: Noll,Judy Address: [address] [place], [place] [address] [place] Phone: [phone] Mobile Phone: [phone] Relation: Spouse Secondary Emergency Contact: Ginzburg,Chris Address: [address] [place], [place] [address] [place] Phone: [phone] Mobile Phone: [phone] Relation: Son     Allergies: Norvasc [amlodipine besylate]  Chief Complaint  Patient presents with  . Readmit To SNF    Readmit to Facility    HPI: Patient is 76 y.o. male with ESRD, coronary disease, type 2 diabetes, hypertension, dyslipidemia, recently admitted for presumed streptococcal bacteremia for which he was treated initially with vancomycin and Zosyn and subsequently transitioned to oral Augmentin and discharged to rehabilitation on 6/7, who presents to the ER today via EMS from his dialysis center with complaints of shortness of breath, nonproductive cough 3-4 days. Patient denies any other symptoms of fever, chills,rigors, sore throat. Denies nausea vomiting diarrhea or abdominal pain. Patient completed full 3 hours of hemodialysis today prior to coming to the ER.Patient found to be 100% on room air, afebrile, nontoxic-appearing Chest x-ray shows worsening infiltrates in the right upper lobe and left midlung consistent with pneumonia, possible central lucency in the right upper lobe suggesting possible cavitary lesion Patient received broad-spectrum antibiotics and was admitted to [facility] from 6/12-19 for HCAP with cavitary lesion, considered  partially treated 2/2 immune status. Pt was treated and will be treated with Vancomycin with dialysis until 7/23. Pt is admitted back to SNF with geneneralized weakness. While at SNF pt will be followed for dementia treated with Aricept, depression treated with Paxil,  and history of heart transplant treated with CellCept, cyclosporine, and Prograf.  Past Medical History:  Diagnosis Date  . Anemia   . Cancer ([place])    SKIN  . CKD III-IV    a. since transplant in 2001 with significant worsening since 06/2013.  Marland Kitchen Coronary artery disease   . Dementia    mild  . Diabetes mellitus without complication (HCC)    FASTING CBG 90S  . Headache(784.0)   . Heart transplanted ([place])    a. 1995 CABG x 4 in [place], [place];  b. 2001 Developed CHF;  c. 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 10/2000 Cardiac transplant @ [facility], [place];  e. Historically nl Bx and stress testing.  Last stress test ~ 5 yrs ago.  Marland Kitchen Hernia of abdominal wall   . History of blood transfusion   . History of stroke   . Hypercalcemia    a. 06/2013 Hospitalized in [place].  Marland Kitchen Hyperlipidemia   . Hypertension    a. Long h/o HTN, came off of all meds following transplant/wt loss-->meds resumed 06/2013, difficult to control since.  . Myocardial infarction ([place])    PRIOR TO HEART TRANSPLANT  . Orthostatic dizziness   . Orthostatic hypotension    a. Has tried support hose and compression stockings - prefers not to wear.  . Shortness of breath    EXERTION  . Stroke ([place]) 06/2013   NO RESIDUAL - WAS CONFUSED AT TIME -  THAT HAS RESOLVED    Past Surgical History:  Procedure Laterality Date  . BASCILIC VEIN TRANSPOSITION Left 07/06/2014   Procedure: [place];  Surgeon: Rosetta Posner, MD;  Location: [facility];  Service: Vascular;  Laterality: Left;  . CARDIAC SURGERY    . CATARACT EXTRACTION    . CORONARY ARTERY BYPASS GRAFT    . CRANIOTOMY Right 05/04/2016   Procedure: Right Craniotomy for Evacuation of Subdural Hematoma;   Surgeon: Earnie Larsson, MD;  Location: [facility] NEURO ORS;  Service: Neurosurgery;  Laterality: Right;  . EYE SURGERY Bilateral    CATARACTS  . HEART TRANSPLANT    . PERIPHERAL VASCULAR CATHETERIZATION N/A 09/06/2016   Procedure: Fistulagram - left arm;  Surgeon: Waynetta Sandy, MD;  Location: [place] CV LAB;  Service: Cardiovascular;  Laterality: N/A;  . PERIPHERAL VASCULAR CATHETERIZATION Left 09/06/2016   Procedure: Peripheral Vascular Intervention;  Surgeon: Waynetta Sandy, MD;  Location: [facility] CV LAB;  Service: Cardiovascular;  Laterality: Left;  UPPER ARM COVERED STENT  . REVISON OF ARTERIOVENOUS FISTULA Left 05/04/2017   Procedure: REMOVAL OF ULCERATION OF ARTERIOVENOUS FISTULA;  Surgeon: Rosetta Posner, MD;  Location: [facility];  Service: Vascular;  Laterality: Left;  . SKIN GRAFT FULL THICKNESS LEG     cat scratch that did not heal  . TEE WITHOUT CARDIOVERSION N/A 05/07/2017   Procedure: TRANSESOPHAGEAL ECHOCARDIOGRAM (TEE);  Surgeon: Sueanne Margarita, MD;  Location: [facility];  Service: Cardiovascular;  Laterality: N/A;  . VASECTOMY    . VIDEO BRONCHOSCOPY  05/04/2017   Procedure: BRONCHIAL LAVAGE;  Surgeon: Javier Glazier, MD;  Location: [facility];  Service: Cardiopulmonary;;    Allergies as of 05/09/2017      Reactions   Norvasc [amlodipine Besylate] Swelling, Other (See Comments)   Swelling of the lower body       Medication List       Accurate as of 05/09/17 11:59 PM. Always use your most recent med list.          acetaminophen 325 MG tablet Commonly known as:  TYLENOL Take 2 tablets (650 mg total) by mouth every 6 (six) hours as needed for mild pain (or Fever >/= 101).   aspirin EC 81 MG tablet Take 81 mg by mouth at bedtime.   calcium acetate 667 MG capsule Commonly known as:  PHOSLO Take 1 capsule (667 mg total) by mouth 3 (three) times daily with meals.   cholecalciferol 1000 units tablet Commonly known as:  VITAMIN D Take 1,000 Units by  mouth daily.   cycloSPORINE modified 25 MG capsule Commonly known as:  NEORAL Take 50-75 mg by mouth See admin instructions. 75 mg in the morning and 50 mg in the evening   diphenoxylate-atropine 2.5-0.025 MG tablet Commonly known as:  LOMOTIL Take 1 tablet by mouth 4 (four) times daily as needed for diarrhea or loose stools.   donepezil 10 MG tablet Commonly known as:  ARICEPT Take 10 mg by mouth at bedtime.   feeding supplement (NEPRO CARB STEADY) Liqd Take 237 mLs by mouth 2 (two) times daily between meals.   FLUoxetine 20 MG capsule Commonly known as:  PROZAC Take 20 mg by mouth daily.   hydrALAZINE 25 MG tablet Commonly known as:  APRESOLINE Take 1 tablet (25 mg total) by mouth 3 (three) times daily.   lansoprazole 30 MG capsule Commonly known as:  PREVACID Take 30 mg by mouth 2 (two) times daily before a meal.   levothyroxine  75 MCG tablet Commonly known as:  SYNTHROID, LEVOTHROID Take 75 mcg by mouth daily.   metoprolol tartrate 25 MG tablet Commonly known as:  LOPRESSOR Take 2 tablets (50 mg total) by mouth 2 (two) times daily.   multivitamin Tabs tablet Take 1 tablet by mouth daily.   mycophenolate 250 MG capsule Commonly known as:  CELLCEPT Take 250-500 mg by mouth daily. Take 500 mg every morning and take 250 mg every evening on Sunday, Monday, Wednesday and Friday then take 500 mg every evening on Tuesday Thursday and Saturday   pravastatin 10 MG tablet Commonly known as:  PRAVACHOL Take 10 mg by mouth at bedtime.   PRO-BIOTIC BLEND Caps Take 1 capsule by mouth daily.   vancomycin 500 mg in sodium chloride 0.9 % 100 mL Inject 500 mg into the vein Every Tuesday,Thursday,and Saturday with dialysis.   vitamin B-12 1000 MCG tablet Commonly known as:  CYANOCOBALAMIN Take 1,000 mcg by mouth daily with breakfast.       No orders of the defined types were placed in this encounter.   Immunization History  Administered Date(s) Administered  .  Influenza, Seasonal, Injecte, Preservative Fre 08/24/2014, 09/01/2015  . Influenza,inj,Quad PF,36+ Mos 08/24/2014, 09/01/2015, 08/09/2016  . PPD Test 04/26/2017  . Pneumococcal Conjugate-13 10/20/2014, 02/05/2015  . Pneumococcal Polysaccharide-23 08/28/2006  . Pneumococcal-Unspecified 08/28/2006  . Tdap 02/04/2010    Social History  Substance Use Topics  . Smoking status: Never Smoker  . Smokeless tobacco: Never Used  . Alcohol use No    Family history is   Family History  Problem Relation Age of Onset  . Heart attack Father        died @ 5  . Diabetes Father   . Heart attack Mother   . Diabetes Mother   . Kidney disease Maternal Aunt       Review of Systems  DATA OBTAINED: from patient, nurse GENERAL:  no fevers, fatigue, appetite changes SKIN: No itching, or rash EYES: No eye pain, redness, discharge EARS: No earache, tinnitus, change in hearing NOSE: No congestion, drainage or bleeding  MOUTH/THROAT: No mouth or tooth pain, No sore throat RESPIRATORY: No cough, wheezing, SOB CARDIAC: No chest pain, palpitations, lower extremity edema  GI: No abdominal pain, No N/V/D or constipation, No heartburn or reflux  GU: No dysuria, frequency or urgency, or incontinence  MUSCULOSKELETAL: No unrelieved bone/joint pain NEUROLOGIC: No headache, dizziness or focal weakness PSYCHIATRIC: No c/o anxiety or sadness   Vitals:   05/09/17 0821  BP: (!) 149/89  Pulse: 72  Resp: 18  Temp: 98.6 F (37 C)    SpO2 Readings from Last 1 Encounters:  05/18/17 100%   Body mass index is 19.39 kg/m.     Physical Exam  GENERAL APPEARANCE: Alert,   No acute distress.  SKIN: No diaphoresis rash HEAD: Normocephalic, atraumatic  EYES: Conjunctiva/lids clear. Pupils round, reactive. EOMs intact.  EARS: External exam WNL, canals clear. Hearing grossly normal.  NOSE: No deformity or discharge.  MOUTH/THROAT: Lips w/o lesions  RESPIRATORY: Breathing is even, unlabored. Lung sounds  are diffusely decreased   CARDIOVASCULAR: Heart RRR no murmurs, rubs or gallops. No peripheral edema.   GASTROINTESTINAL: Abdomen is soft, non-tender, not distended w/ normal bowel sounds. GENITOURINARY: Bladder non tender, not distended  MUSCULOSKELETAL: No abnormal joints or musculature NEUROLOGIC:  Cranial nerves 2-12 grossly intact. Moves all extremities  PSYCHIATRIC: Mood and affect appropriate with dementia, no behavioral issues  Patient Active Problem List   Diagnosis Date  Noted  . MRSA infection   . Bacteremia   . Pressure injury of skin 05/02/2017  . Cavitary lesion of lung   . HCAP (healthcare-associated pneumonia) 05/01/2017  . Malnutrition of moderate degree 04/25/2017  . Streptococcal bacteremia 04/22/2017  . Acute metabolic encephalopathy 81/44/8185  . Volume overload 02/03/2017  . Generalized weakness 08/22/2016  . Urinary tract infection   . Syncope and collapse   . Syncope 08/07/2016  . Faintness   . Uncontrolled hypertension   . Acute blood loss anemia   . Cardiomyopathy ([place])   . Controlled diabetes mellitus type 2 with complications ([place])   . 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 Traumatic subdural hematoma ([place]) 05/04/2016  . 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 Subdural hematoma ([place]) 05/02/2016  . CKD (chronic kidney disease)   . Protein-calorie malnutrition, severe ([place]) 04/23/2015  . Anemia of chronic disease 04/23/2015  . Depression 04/23/2015  . UTI (urinary tract infection) 04/22/2015  . Barrett's esophagus 03/22/2015  . ESRD on dialysis ([place]) 10/12/2014  . DM (diabetes mellitus), type 2 with renal complications ([place]) 63/14/9702  . Hypothyroidism 10/06/2014  . Heart transplant, orthotopic, status ([place])   . Hypercholesterolemia 06/20/2014  . 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[place]) 06/20/2014      Labs reviewed: Basic Metabolic Panel:    Component Value Date/Time   NA 135 05/08/2017 0832    NA 135 (A) 05/08/2017   K 4.7 05/08/2017 0832   CL 98 (L) 05/08/2017 0832   CO2 24 05/08/2017 0832   GLUCOSE 76 05/08/2017 0832   BUN 35 (H) 05/08/2017 0832   BUN 35 (A) 05/08/2017   CREATININE 6.07 (H) 05/08/2017 0832   CALCIUM 8.3 (L) 05/08/2017 0832   PROT 5.4 (L) 05/02/2017 0528   ALBUMIN 2.2 (L) 05/08/2017 0832   AST 15 05/02/2017 0528   ALT 18 05/02/2017 0528   ALKPHOS 92 05/02/2017 0528   BILITOT 0.7 05/02/2017 0528   GFRNONAA 8 (L) 05/08/2017 0832   GFRAA 9 (L) 05/08/2017 0832     Recent Labs  05/01/17 1642  05/03/17 0713  05/06/17 0545 05/07/17 05/07/17 0439 05/08/17 05/08/17 0832  NA 135  < > 136  < > 136 134* 134* 135* 135  K 3.7  < > 4.0  < > 3.6 5.0 5.0  --  4.7  CL 94*  < > 96*  < > 97*  --  95*  --  98*  CO2 29  < > 26  < > 30  --  28  --  24  GLUCOSE 165*  < > 122*  < > 134*  --  97  --  76  BUN 34*  < > 49*  < > 15 24* 24* 35* 35*  CREATININE 4.99*  < > 7.36*  < > 3.56* 4.9* 4.91* 6.1* 6.07*  CALCIUM 8.2*  < > 7.9*  < > 7.9*  --  8.1*  --  8.3*  MG 2.0  --   --   --   --   --   --   --   --   PHOS 3.1  --  5.2*  --   --   --   --   --  3.9  < > = values in this interval not displayed. Liver Function Tests:  Recent Labs  02/04/17 0725 04/22/17 0145  05/02/17 0528 05/03/17 0713 05/08/17 0832  AST 18 23  --  15  --   --  ALT 15* 21  --  18  --   --   ALKPHOS 122 115  --  92  --   --   BILITOT 0.6 0.5  --  0.7  --   --   PROT 5.8* 6.3*  --  5.4*  --   --   ALBUMIN 3.1* 3.4*  < > 2.2* 2.1* 2.2*  < > = values in this interval not displayed. No results for input(s): LIPASE, AMYLASE in the last 8760 hours. No results for input(s): AMMONIA in the last 8760 hours. CBC:  Recent Labs  05/05/17 0432  05/06/17 0545 05/07/17 05/07/17 0439 05/08/17 05/08/17 0832  WBC 8.7  < > 7.8 8.0 8.0 8.0 8.0  NEUTROABS 5.6  --  5.3  --  5.0  --   --   HGB 8.4*  < > 8.1* 8.1* 8.1*  --  8.1*  HCT 27.7*  < > 26.8* 26* 26.0*  --  26.5*  MCV 98.2  --  98.2  --   97.7  --  97.8  PLT 404*  < > 401* 459* 459*  --  429*  < > = values in this interval not displayed. Lipid No results for input(s): CHOL, HDL, LDLCALC, TRIG in the last 8760 hours.  Cardiac Enzymes:  Recent Labs  06/27/16 1935 08/07/16 1317 08/22/16 1320  CKTOTAL  --   --  37*  TROPONINI <0.03 <0.03  --    BNP: No results for input(s): BNP in the last 8760 hours. No results found for: [facility] Lab Results  Component Value Date   HGBA1C 6.3 (H) 05/02/2017   Lab Results  Component Value Date   TSH 1.951 05/01/2017   Lab Results  Component Value Date   VITAMINB12 880 08/25/2014   Lab Results  Component Value Date   FOLATE 7.5 08/25/2014   Lab Results  Component Value Date   IRON 38 (L) 08/25/2014   TIBC 143 (L) 08/25/2014   FERRITIN 612 (H) 08/25/2014    Imaging and Procedures obtained prior to SNF admission: Dg Chest 2 View  Result Date: 05/01/2017 CLINICAL DATA:  Shortness of breath and nonproductive cough over the last 3-4 days. EXAM: CHEST  2 VIEW COMPARISON:  04/22/2017 FINDINGS: Previous median sternotomy. Heart size is normal. There is chronic scarring in the right lower lung. There are progressive patchy densities in the right upper lobe and in the left mid lung. In the right upper lobe, there could be some central low density suggesting possible cavitation. No evidence of edema. Pleural scarring on the right. IMPRESSION: Worsened infiltrates in the right upper lung and left mid lung consistent with pneumonia. Question developing central lucency in the right upper lobe region suggesting possible cavitation. Electronically Signed   By: Nelson Chimes M.D.   On: 05/01/2017 17:00   Ct Chest Wo Contrast  Result Date: 05/01/2017 CLINICAL DATA:  Lung lesions EXAM: CT CHEST WITHOUT CONTRAST TECHNIQUE: Multidetector CT imaging of the chest was performed following the standard protocol without IV contrast. COMPARISON:  Radiographs 05/01/2017, CT thorax 10/05/2014  FINDINGS: Cardiovascular: Limited evaluation without intravenous contrast. Aortic atherosclerosis. Post CABG changes. No significant pericardial effusion. Vascular stent in the left axilla. Mediastinum/Nodes: Mild mediastinal adenopathy, for example right paratracheal lymph node measures 12 mm short axis. Small calcified left hilar lymph nodes. Midline trachea. No thyroid mass. Mild air distention of the esophagus. Lungs/Pleura: Small right pleural effusion or pleural thickening. Mildly loculated pleural effusion along the right pulmonary fissures. Ground-glass density  and consolidation in the right upper lobe with cavitation, this measures approximately 4.4 by 3 cm. Multiple mostly peripheral bilateral lung nodules, for example left upper lobe lateral subpleural nodule measures 7 mm, series 6, image number 50. Anterior subpleural left upper lobe nodule measuring 14 mm, series 6, image number 75. Left subpleural, posterior 16 mm nodule with mild cavitation. Multiple additional pulmonary nodules. Upper Abdomen: Atrophic kidneys.  Splenic granuloma. Musculoskeletal: Post sternotomy changes. No acute or suspicious bone lesion. IMPRESSION: 1. Approximate 4.4 cm cavitary lesion in the right upper lobe with surrounding ground-glass density, differential considerations include cavitary infection and cavitary neoplasm. There are multiple additional bilateral pulmonary nodules, some with cavitation. Differential considerations include cavitary infection, septic emboli, and metastatic disease. 2. Small right-sided pleural effusion with loculated fluid along the right pulmonary fissures. 3. Mediastinal adenopathy could be reactive 4. Atrophic kidneys Aortic Atherosclerosis (ICD10-I70.0). Electronically Signed   By: Donavan Foil M.D.   On: 05/01/2017 19:39     Not all labs, radiology exams or other studies done during hospitalization come through on my EPIC note; however they are reviewed by me.    Assessment and  Plan  HCAP/CAVITARY LUNG LESION-patient's blood cultures which initially showed MR as a burning negative and have been negative and since he has been on antibiotics since 6/7, partially treated pneumonia complicated by immunosuppressant meds, T6/18 does not show vegetation WERE negative; patient to receive IV vancomycin with dialysis until July 23 and will need the usual periodic lab lab work including BMP, CRP, and blood cultures every 2 weeks while on treatment SNF -admitted for generalized weakness, reports pain OT/PT  CAD/STATUS post heart transplant-continue cyclosporine 75 mg in the morning and 50 mg daily at bedtime CellCept 500 mg every morning with 250 mg every at bedtime on Sunday Monday Wednesday and 500 mg daily at bedtime on Tuesday Thursday and Saturday; continue ASA 81 mg daily and metoprolol 50 mg twice a day for coronary artery disease  END-stage renal disease on dialysis SNF -patient continue continue Tuesday Thursday Saturday dialysis and will be receiving vancomycin on each dialysis day until July 23  DM2 SNF - on diet only; did receive sensitive sliding scale insulin in hospital will reinstitute sliding scale insulin as needed  DEPRESSION SNF -stable; plan to continue Prozac 20 mg daily  DE[facility] SNF - stable continue Aricept 10 mg by mouth daily at bedtime  HTN SNF -was noted to be running a little high in the hospital; will be monitoring every shift: Plan to continue hydralazine 25 mg 3 times a day metoprolol 50 mg twice a day   Time spent . >40 min;> 50% of time with patient was spent reviewing records, labs, tests and studies, counseling and developing plan of care  Noah Delaine. Sheppard Coil, MD

## 2017-05-14 ENCOUNTER — Encounter: Payer: Self-pay | Admitting: Internal Medicine

## 2017-05-14 ENCOUNTER — Non-Acute Institutional Stay (SKILLED_NURSING_FACILITY): Payer: Medicare HMO | Admitting: Internal Medicine

## 2017-05-14 DIAGNOSIS — L980 Pyogenic granuloma: Secondary | ICD-10-CM

## 2017-05-14 LAB — VIRUS CULTURE

## 2017-05-14 NOTE — Progress Notes (Signed)
Location:  [facility] Room Number: 893Y Place of Service:  SNF [phone])  Provider: Noah Delaine. Sheppard Coil, MD  Patient Care Team: Smothers, Andree Elk, NP as PCP - General (Nurse Practitioner) Lelon Perla, MD as Consulting Physician (Cardiology)  Extended Emergency Contact Information Primary Emergency Contact: Sadek,Judy Address: [address] [place], [place] [address] [place] Phone: [phone] Mobile Phone: [phone] Relation: Spouse Secondary Emergency Contact: Sassone,Chris Address: [address] [place], [place] [address] [place] Phone: [phone] Mobile Phone: [phone] Relation: Son    Allergies: Norvasc [amlodipine besylate]  Chief Complaint  Patient presents with  . Acute Visit    HPI: Patient is 75 y.o. male who Who will care nurse asked me to see. Patient has a lesion on his arm and that she cannot explain. She has been taking care of it daily and placing a dressing on it to keep it protected. She has noted it for possibly the last 5 days. Patient denies any pain. Patient did have trauma to the area before the lesion appeared.  Past Medical History:  Diagnosis Date  . Anemia   . Cancer ([place])    SKIN  . CKD III-IV    a. since transplant in 2001 with significant worsening since 06/2013.  Marland Kitchen Coronary artery disease   . Dementia    mild  . Diabetes mellitus without complication (HCC)    FASTING CBG 90S  . Headache(784.0)   . 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 1995 CABG x 4 in [place], [place];  b. 2001 Developed CHF;  c. 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 10/2000 Cardiac transplant @ [facility], [place];  e. Historically nl Bx and stress testing.  Last stress test ~ 5 yrs ago.  Marland Kitchen Hernia of abdominal wall   . History of blood transfusion   . History of stroke   . Hypercalcemia    a. 06/2013 Hospitalized in [place].  Marland Kitchen Hyperlipidemia   . Hypertension     a. Long h/o HTN, came off of all meds following transplant/wt loss-->meds resumed 06/2013, difficult to control since.  . Myocardial infarction ([place])    PRIOR TO HEART TRANSPLANT  . Orthostatic dizziness   . Orthostatic hypotension    a. Has tried support hose and compression stockings - prefers not to wear.  . Shortness of breath    EXERTION  . Stroke ([place]) 06/2013   NO RESIDUAL - WAS CONFUSED AT TIME - THAT HAS RESOLVED    Past Surgical History:  Procedure Laterality Date  . BASCILIC VEIN TRANSPOSITION Left 07/06/2014   Procedure: [place];  Surgeon: Rosetta Posner, MD;  Location: [facility];  Service: Vascular;  Laterality: Left;  . CARDIAC SURGERY    . CATARACT EXTRACTION    . CORONARY ARTERY BYPASS GRAFT    . CRANIOTOMY Right 05/04/2016   Procedure: Right Craniotomy for Evacuation of Subdural Hematoma;  Surgeon: Earnie Larsson, MD;  Location: [facility] NEURO ORS;  Service: Neurosurgery;  Laterality: Right;  . EYE SURGERY Bilateral    CATARACTS  . HEART TRANSPLANT    . PERIPHERAL VASCULAR CATHETERIZATION N/A 09/06/2016   Procedure: Fistulagram - left arm;  Surgeon: Waynetta Sandy, MD;  Location: [facility] CV LAB;  Service: Cardiovascular;  Laterality: N/A;  . PERIPHERAL VASCULAR CATHETERIZATION Left 09/06/2016  Procedure: Peripheral Vascular Intervention;  Surgeon: Waynetta Sandy, MD;  Location: [facility] CV LAB;  Service: Cardiovascular;  Laterality: Left;  UPPER ARM COVERED STENT  . REVISON OF ARTERIOVENOUS FISTULA Left 05/04/2017   Procedure: REMOVAL OF ULCERATION OF ARTERIOVENOUS FISTULA;  Surgeon: Rosetta Posner, MD;  Location: [facility];  Service: Vascular;  Laterality: Left;  . SKIN GRAFT FULL THICKNESS LEG     cat scratch that did not heal  . TEE WITHOUT CARDIOVERSION N/A 05/07/2017   Procedure: TRANSESOPHAGEAL ECHOCARDIOGRAM (TEE);  Surgeon: Sueanne Margarita, MD;  Location: [facility];  Service: Cardiovascular;  Laterality: N/A;  . VASECTOMY    .  VIDEO BRONCHOSCOPY  05/04/2017   Procedure: BRONCHIAL LAVAGE;  Surgeon: Javier Glazier, MD;  Location: [facility];  Service: Cardiopulmonary;;    Allergies as of 05/14/2017      Reactions   Norvasc [amlodipine Besylate] Swelling, Other (See Comments)   Swelling of the lower body       Medication List       Accurate as of 05/14/17  3:56 PM. Always use your most recent med list.          acetaminophen 325 MG tablet Commonly known as:  TYLENOL Take 2 tablets (650 mg total) by mouth every 6 (six) hours as needed for mild pain (or Fever >/= 101).   aspirin EC 81 MG tablet Take 81 mg by mouth at bedtime.   calcium acetate 667 MG capsule Commonly known as:  PHOSLO Take 1 capsule (667 mg total) by mouth 3 (three) times daily with meals.   cholecalciferol 1000 units tablet Commonly known as:  VITAMIN D Take 1,000 Units by mouth daily.   cycloSPORINE modified 25 MG capsule Commonly known as:  NEORAL Take 50-75 mg by mouth See admin instructions. 75 mg in the morning and 50 mg in the evening   diphenoxylate-atropine 2.5-0.025 MG tablet Commonly known as:  LOMOTIL Take 1 tablet by mouth 4 (four) times daily as needed for diarrhea or loose stools.   donepezil 10 MG tablet Commonly known as:  ARICEPT Take 10 mg by mouth at bedtime.   feeding supplement (NEPRO CARB STEADY) Liqd Take 237 mLs by mouth 2 (two) times daily between meals.   FLUoxetine 20 MG capsule Commonly known as:  PROZAC Take 20 mg by mouth daily.   hydrALAZINE 25 MG tablet Commonly known as:  APRESOLINE Take 1 tablet (25 mg total) by mouth 3 (three) times daily.   lansoprazole 30 MG capsule Commonly known as:  PREVACID Take 30 mg by mouth 2 (two) times daily before a meal.   levothyroxine 75 MCG tablet Commonly known as:  SYNTHROID, LEVOTHROID Take 75 mcg by mouth daily.   metoprolol tartrate 25 MG tablet Commonly known as:  LOPRESSOR Take 2 tablets (50 mg total) by mouth 2 (two) times daily.     multivitamin Tabs tablet Take 1 tablet by mouth daily.   mycophenolate 250 MG capsule Commonly known as:  CELLCEPT Take 250-500 mg by mouth daily. Take 500 mg every morning and take 250 mg every evening on Sunday, Monday, Wednesday and Friday then take 500 mg every evening on Tuesday Thursday and Saturday   pravastatin 10 MG tablet Commonly known as:  PRAVACHOL Take 10 mg by mouth at bedtime.   PRO-BIOTIC BLEND Caps Take 1 capsule by mouth daily.   vancomycin 500 mg in sodium chloride 0.9 % 100 mL Inject 500 mg into the vein Every Tuesday,Thursday,and Saturday with dialysis.  vitamin B-12 1000 MCG tablet Commonly known as:  CYANOCOBALAMIN Take 1,000 mcg by mouth daily with breakfast.       No orders of the defined types were placed in this encounter.   Immunization History  Administered Date(s) Administered  . Influenza, Seasonal, Injecte, Preservative Fre 08/24/2014, 09/01/2015  . Influenza,inj,Quad PF,36+ Mos 08/24/2014, 09/01/2015, 08/09/2016  . PPD Test 04/26/2017  . Pneumococcal Conjugate-13 10/20/2014, 02/05/2015  . Pneumococcal Polysaccharide-23 08/28/2006  . Pneumococcal-Unspecified 08/28/2006  . Tdap 02/04/2010    Social History  Substance Use Topics  . Smoking status: Never Smoker  . Smokeless tobacco: Never Used  . Alcohol use No    Review of Systems  DATA OBTAINED: from patient, nurse GENERAL:  no fevers, fatigue, appetite changes SKIN: No itching, burning or pain; admits there for a week, maybe? HEENT: No complaint RESPIRATORY: No cough, wheezing, SOB CARDIAC: No chest pain, palpitations, lower extremity edema  GI: No abdominal pain, No N/V/D or constipation, No heartburn or reflux  GU: No dysuria, frequency or urgency, or incontinence  MUSCULOSKELETAL: No unrelieved bone/joint pain NEUROLOGIC: No headache, dizziness  PSYCHIATRIC: No overt anxiety or sadness  Vitals:   05/14/17 1546  BP: (!) 197/95  Pulse: 70  Resp: 18  Temp: 97.4 F  (36.3 C)   Body mass index is 18.26 kg/m. Physical Exam  GENERAL APPEARANCE: Alert, conversant, No acute distress  SKIN: - wound on R forearm with overgrowth of granulation tissues growing from the center HEENT: Unremarkable RESPIRATORY: Breathing is even, unlabored. Lung sounds are clear   CARDIOVASCULAR: Heart RRR no murmurs, rubs or gallops. No peripheral edema  GASTROINTESTINAL: Abdomen is soft, non-tender, not distended w/ normal bowel sounds.  GENITOURINARY: Bladder non tender, not distended  MUSCULOSKELETAL: No abnormal joints or musculature NEUROLOGIC: Cranial nerves 2-12 grossly intact. Moves all extremities PSYCHIATRIC: Mood and affect appropriate with dementia, no behavioral issues  Patient Active Problem List   Diagnosis Date Noted  . MRSA infection   . Bacteremia   . Pressure injury of skin 05/02/2017  . Cavitary lesion of lung   . HCAP (healthcare-associated pneumonia) 05/01/2017  . Malnutrition of moderate degree 04/25/2017  . Streptococcal bacteremia 04/22/2017  . Acute metabolic encephalopathy 05/39/7673  . Volume overload 02/03/2017  . Generalized weakness 08/22/2016  . Urinary tract infection   . Syncope and collapse   . Syncope 08/07/2016  . Faintness   . Uncontrolled hypertension   . Acute blood loss anemia   . Cardiomyopathy ([place])   . Controlled diabetes mellitus type 2 with complications ([place])   . 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 Traumatic subdural hematoma ([place]) 05/04/2016  . 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 Subdural hematoma ([place]) 05/02/2016  . CKD (chronic kidney disease)   . Protein-calorie malnutrition, severe ([place]) 04/23/2015  . Anemia of chronic disease 04/23/2015  . Depression 04/23/2015  . UTI (urinary tract infection) 04/22/2015  . Barrett's esophagus 03/22/2015  . ESRD on dialysis ([place]) 10/12/2014  . DM (diabetes mellitus), type 2 with renal complications ([place]) 41/93/7902  .  Hypothyroidism 10/06/2014  . Heart transplant, orthotopic, status ([place])   . Hypercholesterolemia 06/20/2014  . 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[place]) 06/20/2014    CMP     Component Value Date/Time   NA 135 05/08/2017 0832   NA 135 (A) 05/08/2017   K 4.7 05/08/2017 0832   CL 98 (L) 05/08/2017 0832   CO2 24 05/08/2017 0832   GLUCOSE 76 05/08/2017 4097  BUN 35 (H) 05/08/2017 0832   BUN 35 (A) 05/08/2017   CREATININE 6.07 (H) 05/08/2017 0832   CALCIUM 8.3 (L) 05/08/2017 0832   PROT 5.4 (L) 05/02/2017 0528   ALBUMIN 2.2 (L) 05/08/2017 0832   AST 15 05/02/2017 0528   ALT 18 05/02/2017 0528   ALKPHOS 92 05/02/2017 0528   BILITOT 0.7 05/02/2017 0528   GFRNONAA 8 (L) 05/08/2017 0832   GFRAA 9 (L) 05/08/2017 0832    Recent Labs  05/01/17 1642  05/03/17 0713  05/06/17 0545 05/07/17 05/07/17 0439 05/08/17 05/08/17 0832  NA 135  < > 136  < > 136 134* 134* 135* 135  K 3.7  < > 4.0  < > 3.6 5.0 5.0  --  4.7  CL 94*  < > 96*  < > 97*  --  95*  --  98*  CO2 29  < > 26  < > 30  --  28  --  24  GLUCOSE 165*  < > 122*  < > 134*  --  97  --  76  BUN 34*  < > 49*  < > 15 24* 24* 35* 35*  CREATININE 4.99*  < > 7.36*  < > 3.56* 4.9* 4.91* 6.1* 6.07*  CALCIUM 8.2*  < > 7.9*  < > 7.9*  --  8.1*  --  8.3*  MG 2.0  --   --   --   --   --   --   --   --   PHOS 3.1  --  5.2*  --   --   --   --   --  3.9  < > = values in this interval not displayed.  Recent Labs  02/04/17 0725 04/22/17 0145  05/02/17 0528 05/03/17 0713 05/08/17 0832  AST 18 23  --  15  --   --   ALT 15* 21  --  18  --   --   ALKPHOS 122 115  --  92  --   --   BILITOT 0.6 0.5  --  0.7  --   --   PROT 5.8* 6.3*  --  5.4*  --   --   ALBUMIN 3.1* 3.4*  < > 2.2* 2.1* 2.2*  < > = values in this interval not displayed.  Recent Labs  05/05/17 0432  05/06/17 0545 05/07/17 05/07/17 0439 05/08/17 05/08/17 0832  WBC 8.7  < > 7.8 8.0 8.0 8.0 8.0  NEUTROABS 5.6  --  5.3  --  5.0  --   --   HGB 8.4*  < >  8.1* 8.1* 8.1*  --  8.1*  HCT 27.7*  < > 26.8* 26* 26.0*  --  26.5*  MCV 98.2  --  98.2  --  97.7  --  97.8  PLT 404*  < > 401* 459* 459*  --  429*  < > = values in this interval not displayed. No results for input(s): CHOL, LDLCALC, TRIG in the last 8760 hours.  Invalid input(s): HCL No results found for: [facility] Lab Results  Component Value Date   TSH 1.951 05/01/2017   Lab Results  Component Value Date   HGBA1C 6.3 (H) 05/02/2017   Lab Results  Component Value Date   CHOL 139 06/22/2014   HDL 29 (L) 06/22/2014   LDLCALC 74 06/22/2014   TRIG 181 (H) 06/22/2014   CHOLHDL 4.8 06/22/2014    Significant Diagnostic Results in last 30 days:  Dg Chest 2 View  Result Date: 05/01/2017 CLINICAL DATA:  Shortness of breath and nonproductive cough over the last 3-4 days. EXAM: CHEST  2 VIEW COMPARISON:  04/22/2017 FINDINGS: Previous median sternotomy. Heart size is normal. There is chronic scarring in the right lower lung. There are progressive patchy densities in the right upper lobe and in the left mid lung. In the right upper lobe, there could be some central low density suggesting possible cavitation. No evidence of edema. Pleural scarring on the right. IMPRESSION: Worsened infiltrates in the right upper lung and left mid lung consistent with pneumonia. Question developing central lucency in the right upper lobe region suggesting possible cavitation. Electronically Signed   By: Nelson Chimes M.D.   On: 05/01/2017 17:00   Dg Chest 2 View  Result Date: 04/22/2017 CLINICAL DATA:  Rash, positive blood cultures EXAM: CHEST  2 VIEW COMPARISON:  02/04/2017, 11/09/2016 FINDINGS: Post sternotomy changes. Ill-defined opacities in the peripheral right upper lobe, right base and left mid lung. No pleural effusion. Mild cardiomegaly with aortic atherosclerosis. No pneumothorax. Vascular stent in the left axilla. IMPRESSION: 1. Ill-defined multifocal opacity in the right upper lobe, right base and left  upper lobe suspicious for infiltrates. 2. Cardiomegaly without edema. Electronically Signed   By: Donavan Foil M.D.   On: 04/22/2017 02:07   Ct Chest Wo Contrast  Result Date: 05/01/2017 CLINICAL DATA:  Lung lesions EXAM: CT CHEST WITHOUT CONTRAST TECHNIQUE: Multidetector CT imaging of the chest was performed following the standard protocol without IV contrast. COMPARISON:  Radiographs 05/01/2017, CT thorax 10/05/2014 FINDINGS: Cardiovascular: Limited evaluation without intravenous contrast. Aortic atherosclerosis. Post CABG changes. No significant pericardial effusion. Vascular stent in the left axilla. Mediastinum/Nodes: Mild mediastinal adenopathy, for example right paratracheal lymph node measures 12 mm short axis. Small calcified left hilar lymph nodes. Midline trachea. No thyroid mass. Mild air distention of the esophagus. Lungs/Pleura: Small right pleural effusion or pleural thickening. Mildly loculated pleural effusion along the right pulmonary fissures. Ground-glass density and consolidation in the right upper lobe with cavitation, this measures approximately 4.4 by 3 cm. Multiple mostly peripheral bilateral lung nodules, for example left upper lobe lateral subpleural nodule measures 7 mm, series 6, image number 50. Anterior subpleural left upper lobe nodule measuring 14 mm, series 6, image number 75. Left subpleural, posterior 16 mm nodule with mild cavitation. Multiple additional pulmonary nodules. Upper Abdomen: Atrophic kidneys.  Splenic granuloma. Musculoskeletal: Post sternotomy changes. No acute or suspicious bone lesion. IMPRESSION: 1. Approximate 4.4 cm cavitary lesion in the right upper lobe with surrounding ground-glass density, differential considerations include cavitary infection and cavitary neoplasm. There are multiple additional bilateral pulmonary nodules, some with cavitation. Differential considerations include cavitary infection, septic emboli, and metastatic disease. 2. Small  right-sided pleural effusion with loculated fluid along the right pulmonary fissures. 3. Mediastinal adenopathy could be reactive 4. Atrophic kidneys Aortic Atherosclerosis (ICD10-I70.0). Electronically Signed   By: Donavan Foil M.D.   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 surgery as outpatient for same     Anne D. Sheppard Coil, MD

## 2017-05-17 ENCOUNTER — Encounter: Payer: Medicare HMO | Admitting: Family

## 2017-05-18 ENCOUNTER — Ambulatory Visit (INDEPENDENT_AMBULATORY_CARE_PROVIDER_SITE_OTHER): Payer: Self-pay | Admitting: Family

## 2017-05-18 ENCOUNTER — Encounter: Payer: Self-pay | Admitting: Family

## 2017-05-18 VITALS — BP 180/80 | HR 61 | Temp 97.2°F | Resp 20 | Ht 74.0 in | Wt 142.0 lb

## 2017-05-18 DIAGNOSIS — N186 End stage renal disease: Secondary | ICD-10-CM

## 2017-05-18 DIAGNOSIS — I770 Arteriovenous fistula, acquired: Secondary | ICD-10-CM

## 2017-05-18 DIAGNOSIS — T82590D Other mechanical complication of surgically created arteriovenous fistula, subsequent encounter: Secondary | ICD-10-CM

## 2017-05-18 DIAGNOSIS — Z4802 Encounter for removal of sutures: Secondary | ICD-10-CM

## 2017-05-18 DIAGNOSIS — Z992 Dependence on renal dialysis: Secondary | ICD-10-CM

## 2017-05-18 NOTE — Progress Notes (Addendum)
Postoperative Access Visit   History of Present Illness  [name] [name] is a 76 y.o. year old male who is s/p revision of left upper arm AV fistula with excision of ellipse of skin and vein and repair of vein and closure of skin by Dr. Donnetta Hutching on 05-04-17 for ulceration over left upper arm AV fistula. He returns today for suture removal.   He dialyzes via his AV fistula, above and below the repair site on T-TH-S, at [facility], next to [employer] where he is receiving rehab for weakness, his wife is not able to help him at home. He plans to go home next week.  He denies any steal sx's in his left upper extremity.  The left upper arm incision is healed.   The patient is able to complete their activities of daily living.    For VQI Use Only  PRE-ADM LIVING: Nursing home  AMB STATUS: Wheelchair   Past Medical History:  Diagnosis Date  . Anemia   . Cancer ([place])    SKIN  . CKD III-IV    a. since transplant in 2001 with significant worsening since 06/2013.  Marland Kitchen Coronary artery disease   . Dementia    mild  . Diabetes mellitus without complication (HCC)    FASTING CBG 90S  . Headache(784.0)   . 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 1995 CABG x 4 in [place], [place];  b. 2001 Developed CHF;  c. 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 10/2000 Cardiac transplant @ [facility], [place];  e. Historically nl Bx and stress testing.  Last stress test ~ 5 yrs ago.  Marland Kitchen Hernia of abdominal wall   . History of blood transfusion   . History of stroke   . Hypercalcemia    a. 06/2013 Hospitalized in [place].  Marland Kitchen Hyperlipidemia   . Hypertension    a. Long h/o HTN, came off of all meds following transplant/wt loss-->meds resumed 06/2013, difficult to control since.  . Myocardial infarction ([place])    PRIOR TO HEART TRANSPLANT  . Orthostatic dizziness   . Orthostatic hypotension    a. Has tried support hose and compression stockings - prefers not to wear.  . Shortness of breath    EXERTION  . Stroke ([place]) 06/2013     NO RESIDUAL - WAS CONFUSED AT TIME - THAT HAS RESOLVED    Past Surgical History:  Procedure Laterality Date  . BASCILIC VEIN TRANSPOSITION Left 07/06/2014   Procedure: [place];  Surgeon: Rosetta Posner, MD;  Location: [facility];  Service: Vascular;  Laterality: Left;  . CARDIAC SURGERY    . CATARACT EXTRACTION    . CORONARY ARTERY BYPASS GRAFT    . CRANIOTOMY Right 05/04/2016   Procedure: Right Craniotomy for Evacuation of Subdural Hematoma;  Surgeon: Earnie Larsson, MD;  Location: [facility] NEURO ORS;  Service: Neurosurgery;  Laterality: Right;  . EYE SURGERY Bilateral    CATARACTS  . HEART TRANSPLANT    . PERIPHERAL VASCULAR CATHETERIZATION N/A 09/06/2016   Procedure: Fistulagram - left arm;  Surgeon: Waynetta Sandy, MD;  Location: [facility] CV LAB;  Service: Cardiovascular;  Laterality: N/A;  . PERIPHERAL VASCULAR CATHETERIZATION Left 09/06/2016   Procedure: Peripheral Vascular Intervention;  Surgeon: Waynetta Sandy, MD;  Location: [facility] CV LAB;  Service: Cardiovascular;  Laterality: Left;  UPPER ARM COVERED STENT  . REVISON OF ARTERIOVENOUS FISTULA Left 05/04/2017   Procedure: REMOVAL OF ULCERATION OF ARTERIOVENOUS FISTULA;  Surgeon: Rosetta Posner,  MD;  Location: [facility];  Service: Vascular;  Laterality: Left;  . SKIN GRAFT FULL THICKNESS LEG     cat scratch that did not heal  . TEE WITHOUT CARDIOVERSION N/A 05/07/2017   Procedure: TRANSESOPHAGEAL ECHOCARDIOGRAM (TEE);  Surgeon: Sueanne Margarita, MD;  Location: [facility];  Service: Cardiovascular;  Laterality: N/A;  . VASECTOMY    . VIDEO BRONCHOSCOPY  05/04/2017   Procedure: BRONCHIAL LAVAGE;  Surgeon: Javier Glazier, MD;  Location: [facility];  Service: Cardiopulmonary;;    Social History   Social History  . Marital status: Married    Spouse name: N/A  . Number of children: 1  . Years of education: N/A   Occupational History  . Education officer, community    Social History Main Topics  . Smoking  status: Never Smoker  . Smokeless tobacco: Never Used  . Alcohol use No  . Drug use: No  . Sexual activity: No   Other Topics Concern  . Not on file   Social History Narrative   Patient is from [place].  Moved to [place] in late '90's.  Moved to [place] 06/20/2014 to live closer to son.  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  Plans to continue to work now that he is in [place].   Admitted to [facility] 04/26/2017   Married   Never smoked   Alcohol none   DNR    Allergies  Allergen Reactions  . Norvasc [Amlodipine Besylate] Swelling and Other (See Comments)    Swelling of the lower body     Current Outpatient Prescriptions on File Prior to Visit  Medication Sig Dispense Refill  . acetaminophen (TYLENOL) 325 MG tablet Take 2 tablets (650 mg total) by mouth every 6 (six) hours as needed for mild pain (or Fever >/= 101).    Marland Kitchen aspirin EC 81 MG tablet Take 81 mg by mouth at bedtime.    . calcium acetate (PHOSLO) 667 MG capsule Take 1 capsule (667 mg total) by mouth 3 (three) times daily with meals.    . cholecalciferol (VITAMIN D) 1000 UNITS tablet Take 1,000 Units by mouth daily.    . cycloSPORINE modified (NEORAL) 25 MG capsule Take 50-75 mg by mouth See admin instructions. 75 mg in the morning and 50 mg in the evening    . diphenoxylate-atropine (LOMOTIL) 2.5-0.025 MG tablet Take 1 tablet by mouth 4 (four) times daily as needed for diarrhea or loose stools.    . donepezil (ARICEPT) 10 MG tablet Take 10 mg by mouth at bedtime.     Marland Kitchen FLUoxetine (PROZAC) 20 MG capsule Take 20 mg by mouth daily.     . hydrALAZINE (APRESOLINE) 25 MG tablet Take 1 tablet (25 mg total) by mouth 3 (three) times daily.    . lansoprazole (PREVACID) 30 MG capsule Take 30 mg by mouth 2 (two) times daily before a meal.     . levothyroxine (SYNTHROID, LEVOTHROID) 75 MCG tablet Take 75 mcg by mouth daily.     . metoprolol tartrate (LOPRESSOR) 25 MG tablet Take 2 tablets (50 mg total)  by mouth 2 (two) times daily. 120 tablet 0  . multivitamin (RENA-VIT) TABS tablet Take 1 tablet by mouth daily.   5  . mycophenolate (CELLCEPT) 250 MG capsule Take 250-500 mg by mouth daily. Take 500 mg every morning and take 250 mg every evening on Sunday, Monday, Wednesday and Friday then take 500 mg every evening on Tuesday  Thursday and Saturday    . Nutritional Supplements (FEEDING SUPPLEMENT, NEPRO CARB STEADY,) LIQD Take 237 mLs by mouth 2 (two) times daily between meals.  0  . pravastatin (PRAVACHOL) 10 MG tablet Take 10 mg by mouth at bedtime.     . Probiotic Product (PRO-BIOTIC BLEND) CAPS Take 1 capsule by mouth daily.    . vancomycin 500 mg in sodium chloride 0.9 % 100 mL Inject 500 mg into the vein Every Tuesday,Thursday,and Saturday with dialysis.    Marland Kitchen vitamin B-12 (CYANOCOBALAMIN) 1000 MCG tablet Take 1,000 mcg by mouth daily with breakfast.     No current facility-administered medications on file prior to visit.       Physical Examination Vitals:   05/18/17 1013 05/18/17 1021  BP: (!) 178/78 (!) 180/80  Pulse: 61   Resp: 20   Temp: 97.2 F (36.2 C)   TempSrc: Oral   SpO2: 100%   Weight: 142 lb (64.4 kg)   Height: 6\' 2"  (1.88 m)    Body mass index is 18.23 kg/m.  LUE: Incision is healed, skin feels warm and normal, hand grip is 5/5, sensation in digits is  intact, palpable thrill. Left radial pulse is 1+ palpable.   Medical Decision Making  [name] [name] is a 76 y.o. year old male who presents s/p revision of left upper arm AV fistula with excision of ellipse of skin and vein and repair of vein and closure of skin by Dr. Donnetta Hutching on 05-04-17 for ulceration over left upper arm AV fistula. All sutures removed.  Avoid accessing the surgical repair site for 4 weeks after the above surgical repair.   Follow up with [facility] as needed.  Thank you for allowing [place] to participate in this patient's care.  NICKEL, Sharmon Leyden, RN, MSN, FNP-C [facility] Office: [phone]  05/18/2017, 10:23 AM  Clinic MD: Chen/[facility]

## 2017-05-19 ENCOUNTER — Encounter: Payer: Self-pay | Admitting: Internal Medicine

## 2017-05-19 DIAGNOSIS — I2510 Atherosclerotic heart disease of native coronary artery without angina pectoris: Secondary | ICD-10-CM | POA: Insufficient documentation

## 2017-05-24 ENCOUNTER — Non-Acute Institutional Stay (SKILLED_NURSING_FACILITY): Payer: Medicare HMO | Admitting: Internal Medicine

## 2017-05-24 ENCOUNTER — Encounter: Payer: Self-pay | Admitting: Internal Medicine

## 2017-05-24 DIAGNOSIS — J984 Other disorders of lung: Secondary | ICD-10-CM

## 2017-05-24 DIAGNOSIS — F320 Major depressive disorder, single episode, mild: Secondary | ICD-10-CM

## 2017-05-24 DIAGNOSIS — E118 Type 2 diabetes mellitus with unspecified complications: Secondary | ICD-10-CM

## 2017-05-24 DIAGNOSIS — I5022 Chronic systolic (congestive) heart failure: Secondary | ICD-10-CM | POA: Diagnosis not present

## 2017-05-24 DIAGNOSIS — I110 Hypertensive heart disease with heart failure: Secondary | ICD-10-CM | POA: Diagnosis not present

## 2017-05-24 DIAGNOSIS — N186 End stage renal disease: Secondary | ICD-10-CM

## 2017-05-24 DIAGNOSIS — Z941 Heart transplant status: Secondary | ICD-10-CM | POA: Diagnosis not present

## 2017-05-24 DIAGNOSIS — J189 Pneumonia, unspecified organism: Secondary | ICD-10-CM | POA: Diagnosis not present

## 2017-05-24 DIAGNOSIS — F0280 Dementia in other diseases classified elsewhere without behavioral disturbance: Secondary | ICD-10-CM

## 2017-05-24 DIAGNOSIS — Z992 Dependence on renal dialysis: Secondary | ICD-10-CM | POA: Diagnosis not present

## 2017-05-24 NOTE — Progress Notes (Signed)
Location:  [place] Room Number: [place]:  SNF [phone])  Provider: Noah Delaine. Sheppard Coil, MD  PCP: Theodis Aguas, Andree Elk, NP Patient Care Team: Smothers, Andree Elk, NP as PCP - General (Nurse Practitioner) Lelon Perla, MD as Consulting Physician (Cardiology)  Extended Emergency Contact Information Primary Emergency Contact: Kuzel,Judy Address: [address] [place], [place] [address] Johnnette Litter of [place] Phone: [phone] Mobile Phone: [phone] Relation: Spouse Secondary Emergency Contact: Wollard,Chris Address: [address] [place], [place] [address] Johnnette Litter of [place] Phone: [phone] Mobile Phone: [phone] Relation: Son  Allergies  Allergen Reactions  . Norvasc [Amlodipine Besylate] Swelling and Other (See Comments)    Swelling of the lower body     Chief Complaint  Patient presents with  . Discharge Note    discharge from SNF to home    HPI:  76 y.o. male  with ESRD, coronary disease, type 2 diabetes, hypertension, dyslipidemia, recently admitted for presumed streptococcal bacteremia for which he was treated initially with vancomycin and Zosyn and subsequently transitioned to oral Augmentin and discharged to rehabilitation on 6/7, who presents to the ER today via EMS from his dialysis center with complaints of shortness of breath, nonproductive cough 3-4 days. Patient denies any other symptoms of fever, chills,rigors,sore throat. Denies nausea vomiting diarrhea or abdominal pain. Patient completed full 3 hours of hemodialysis today prior to coming to the ER.Patient found to be 100% on room air, afebrile, nontoxic-appearing Chest x-ray shows worsening infiltrates in the right upper lobe and left midlung consistent with pneumonia, possible central lucency in the right upper lobe suggesting possible cavitary lesion Patient received broad-spectrum antibiotics  and was admitted to [facility] from 6/12-19 for HCAP with cavitary lesion, considered partially treated 2/2 immune status. Pt was treated and will be treated with Vancomycin with dialysis until 7/23. Pt is admitted back to SNF with geneneralized weakness butut is at his baseline and ready to be d/c to home.    Past Medical History:  Diagnosis Date  . Anemia   . Cancer ([place])    SKIN  . CKD III-IV    a. since transplant in 2001 with significant worsening since 06/2013.  Marland Kitchen Coronary artery disease   . Dementia    mild  . Diabetes mellitus without complication (HCC)    FASTING CBG 90S  . Headache(784.0)   . 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 1995 CABG x 4 in [place], [place];  b. 2001 Developed CHF;  c. 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 10/2000 Cardiac transplant @ [facility], [place];  e. Historically nl Bx and stress testing.  Last stress test ~ 5 yrs ago.  Marland Kitchen Hernia of abdominal wall   . History of blood transfusion   . History of stroke   . Hypercalcemia    a. 06/2013 Hospitalized in [place].  Marland Kitchen Hyperlipidemia   . Hypertension    a. Long h/o HTN, came off of all meds following transplant/wt loss-->meds resumed 06/2013, difficult to control since.  . Myocardial infarction ([place])    PRIOR TO HEART TRANSPLANT  . Orthostatic dizziness   . Orthostatic hypotension    a. Has tried support hose and compression stockings - prefers not to wear.  . Shortness of breath    EXERTION  . Stroke ([place]) 06/2013   NO RESIDUAL - WAS  CONFUSED AT TIME - THAT HAS RESOLVED    Past Surgical History:  Procedure Laterality Date  . BASCILIC VEIN TRANSPOSITION Left 07/06/2014   Procedure: [place];  Surgeon: Rosetta Posner, MD;  Location: [facility];  Service: Vascular;  Laterality: Left;  . CARDIAC SURGERY    . CATARACT EXTRACTION    . CORONARY ARTERY BYPASS GRAFT    . CRANIOTOMY Right 05/04/2016   Procedure: Right Craniotomy for Evacuation of Subdural Hematoma;  Surgeon: Earnie Larsson, MD;  Location: [facility] NEURO ORS;   Service: Neurosurgery;  Laterality: Right;  . EYE SURGERY Bilateral    CATARACTS  . HEART TRANSPLANT    . PERIPHERAL VASCULAR CATHETERIZATION N/A 09/06/2016   Procedure: Fistulagram - left arm;  Surgeon: Waynetta Sandy, MD;  Location: [facility] CV LAB;  Service: Cardiovascular;  Laterality: N/A;  . PERIPHERAL VASCULAR CATHETERIZATION Left 09/06/2016   Procedure: Peripheral Vascular Intervention;  Surgeon: Waynetta Sandy, MD;  Location: [facility] CV LAB;  Service: Cardiovascular;  Laterality: Left;  UPPER ARM COVERED STENT  . REVISON OF ARTERIOVENOUS FISTULA Left 05/04/2017   Procedure: REMOVAL OF ULCERATION OF ARTERIOVENOUS FISTULA;  Surgeon: Rosetta Posner, MD;  Location: [facility];  Service: Vascular;  Laterality: Left;  . SKIN GRAFT FULL THICKNESS LEG     cat scratch that did not heal  . TEE WITHOUT CARDIOVERSION N/A 05/07/2017   Procedure: TRANSESOPHAGEAL ECHOCARDIOGRAM (TEE);  Surgeon: Sueanne Margarita, MD;  Location: [facility];  Service: Cardiovascular;  Laterality: N/A;  . VASECTOMY    . VIDEO BRONCHOSCOPY  05/04/2017   Procedure: BRONCHIAL LAVAGE;  Surgeon: Javier Glazier, MD;  Location: [facility];  Service: Cardiopulmonary;;     reports that he has never smoked. He has never used smokeless tobacco. He reports that he does not drink alcohol or use drugs. Social History   Social History  . Marital status: Married    Spouse name: N/A  . Number of children: 1  . Years of education: N/A   Occupational History  . Education officer, community    Social History Main Topics  . Smoking status: Never Smoker  . Smokeless tobacco: Never Used  . Alcohol use No  . Drug use: No  . Sexual activity: No   Other Topics Concern  . Not on file   Social History Narrative   Patient is from [place].  Moved to [place] in late '90's.  Moved to [place] 06/20/2014 to live closer to son.  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  Plans to continue to work now that he is  in [place].   Admitted to [facility] 04/26/2017   [facility]   Never smoked   Alcohol none   DNR       Pertinent  Health Maintenance Due  Topic Date Due  . FOOT EXAM  10/30/1951  . OPHTHALMOLOGY EXAM  10/30/1951  . URINE MICROALBUMIN  10/30/1951  . INFLUENZA VACCINE  06/20/2017  . HEMOGLOBIN A1C  11/01/2017  . PNA vac Low Risk Adult  Completed    Medications: Allergies as of 05/24/2017      Reactions   Norvasc [amlodipine Besylate] Swelling, Other (See Comments)   Swelling of the lower body       Medication List       Accurate as of 05/24/17 12:20 PM. Always use your most recent med list.          acetaminophen 325 MG tablet Commonly  known as:  TYLENOL Take 2 tablets (650 mg total) by mouth every 6 (six) hours as needed for mild pain (or Fever >/= 101).   aspirin EC 81 MG tablet Take 81 mg by mouth at bedtime.   calcium acetate 667 MG capsule Commonly known as:  PHOSLO Take 1 capsule (667 mg total) by mouth 3 (three) times daily with meals.   cholecalciferol 1000 units tablet Commonly known as:  VITAMIN D Take 1,000 Units by mouth daily.   cycloSPORINE modified 25 MG capsule Commonly known as:  NEORAL Take 50-75 mg by mouth See admin instructions. 75 mg in the morning and 50 mg in the evening   diphenoxylate-atropine 2.5-0.025 MG tablet Commonly known as:  LOMOTIL Take 1 tablet by mouth 4 (four) times daily as needed for diarrhea or loose stools.   donepezil 10 MG tablet Commonly known as:  ARICEPT Take 10 mg by mouth at bedtime.   feeding supplement (NEPRO CARB STEADY) Liqd Take 237 mLs by mouth 2 (two) times daily between meals.   FLUoxetine 20 MG capsule Commonly known as:  PROZAC Take 20 mg by mouth daily.   Fluticasone Furoate 50 MCG/ACT Aepb Inhale into the lungs.   hydrALAZINE 25 MG tablet Commonly known as:  APRESOLINE Take 1 tablet (25 mg total) by mouth 3 (three) times daily.   lansoprazole 30 MG capsule Commonly known as:   PREVACID Take 30 mg by mouth 2 (two) times daily before a meal.   levothyroxine 75 MCG tablet Commonly known as:  SYNTHROID, LEVOTHROID Take 75 mcg by mouth daily.   metoprolol tartrate 25 MG tablet Commonly known as:  LOPRESSOR Take 2 tablets (50 mg total) by mouth 2 (two) times daily.   multivitamin Tabs tablet Take 1 tablet by mouth daily.   mycophenolate 250 MG capsule Commonly known as:  CELLCEPT Take 250-500 mg by mouth daily. Take 500 mg every morning and take 250 mg every evening on Sunday, Monday, Wednesday and Friday then take 500 mg every evening on Tuesday Thursday and Saturday   naproxen 500 MG tablet Commonly known as:  NAPROSYN Take 500 mg by mouth 2 (two) times daily with a meal.   pravastatin 10 MG tablet Commonly known as:  PRAVACHOL Take 10 mg by mouth at bedtime.   PRO-BIOTIC BLEND Caps Take 1 capsule by mouth daily.   vancomycin 500 mg in sodium chloride 0.9 % 100 mL Inject 500 mg into the vein Every Tuesday,Thursday,and Saturday with dialysis.   vitamin B-12 1000 MCG tablet Commonly known as:  CYANOCOBALAMIN Take 1,000 mcg by mouth daily with breakfast.        Vitals:   05/24/17 0949  BP: (!) 177/79  Pulse: 79  Resp: 18  Temp: 98.6 F (37 C)  SpO2: 96%  Weight: 142 lb (64.4 kg)  Height: 6\' 2"  (1.88 m)   Body mass index is 18.23 kg/m.  Physical Exam  GENERAL APPEARANCE: Alert, No acute distress.  HEENT: Unremarkable. RESPIRATORY: Breathing is even, unlabored. Lung sounds are clear   CARDIOVASCULAR: Heart RRR no murmurs, rubs or gallops. No peripheral edema.  GASTROINTESTINAL: Abdomen is soft, non-tender, not distended w/ normal bowel sounds.  NEUROLOGIC: Cranial nerves 2-12 grossly intact. Moves all extremities   Labs reviewed: Basic Metabolic Panel:  Recent Labs  05/01/17 1642  05/03/17 0713  05/06/17 0545 05/07/17 05/07/17 0439 05/08/17 05/08/17 0832  NA 135  < > 136  < > 136 134* 134* 135* 135  K 3.7  < >  4.0  < > 3.6  5.0 5.0  --  4.7  CL 94*  < > 96*  < > 97*  --  95*  --  98*  CO2 29  < > 26  < > 30  --  28  --  24  GLUCOSE 165*  < > 122*  < > 134*  --  97  --  76  BUN 34*  < > 49*  < > 15 24* 24* 35* 35*  CREATININE 4.99*  < > 7.36*  < > 3.56* 4.9* 4.91* 6.1* 6.07*  CALCIUM 8.2*  < > 7.9*  < > 7.9*  --  8.1*  --  8.3*  MG 2.0  --   --   --   --   --   --   --   --   PHOS 3.1  --  5.2*  --   --   --   --   --  3.9  < > = values in this interval not displayed. No results found for: [facility] Liver Function Tests:  Recent Labs  02/04/17 0725 04/22/17 0145  05/02/17 0528 05/03/17 0713 05/08/17 0832  AST 18 23  --  15  --   --   ALT 15* 21  --  18  --   --   ALKPHOS 122 115  --  92  --   --   BILITOT 0.6 0.5  --  0.7  --   --   PROT 5.8* 6.3*  --  5.4*  --   --   ALBUMIN 3.1* 3.4*  < > 2.2* 2.1* 2.2*  < > = values in this interval not displayed. No results for input(s): LIPASE, AMYLASE in the last 8760 hours. No results for input(s): AMMONIA in the last 8760 hours. CBC:  Recent Labs  05/05/17 0432  05/06/17 0545 05/07/17 05/07/17 0439 05/08/17 05/08/17 0832  WBC 8.7  < > 7.8 8.0 8.0 8.0 8.0  NEUTROABS 5.6  --  5.3  --  5.0  --   --   HGB 8.4*  < > 8.1* 8.1* 8.1*  --  8.1*  HCT 27.7*  < > 26.8* 26* 26.0*  --  26.5*  MCV 98.2  --  98.2  --  97.7  --  97.8  PLT 404*  < > 401* 459* 459*  --  429*  < > = values in this interval not displayed. Lipid No results for input(s): CHOL, HDL, LDLCALC, TRIG in the last 8760 hours. Cardiac Enzymes:  Recent Labs  06/27/16 1935 08/07/16 1317 08/22/16 1320  CKTOTAL  --   --  37*  TROPONINI <0.03 <0.03  --    BNP: No results for input(s): BNP in the last 8760 hours. CBG:  Recent Labs  05/08/17 1304 05/08/17 1641 05/08/17 2035  GLUCAP 140* 146* 78    Procedures and Imaging Studies During Stay: Dg Chest 2 View  Result Date: 05/01/2017 CLINICAL DATA:  Shortness of breath and nonproductive cough over the last 3-4 days. EXAM: CHEST  2  VIEW COMPARISON:  04/22/2017 FINDINGS: Previous median sternotomy. Heart size is normal. There is chronic scarring in the right lower lung. There are progressive patchy densities in the right upper lobe and in the left mid lung. In the right upper lobe, there could be some central low density suggesting possible cavitation. No evidence of edema. Pleural scarring on the right. IMPRESSION: Worsened infiltrates in the right upper lung and left mid lung  consistent with pneumonia. Question developing central lucency in the right upper lobe region suggesting possible cavitation. Electronically Signed   By: Nelson Chimes M.D.   On: 05/01/2017 17:00   Ct Chest Wo Contrast  Result Date: 05/01/2017 CLINICAL DATA:  Lung lesions EXAM: CT CHEST WITHOUT CONTRAST TECHNIQUE: Multidetector CT imaging of the chest was performed following the standard protocol without IV contrast. COMPARISON:  Radiographs 05/01/2017, CT thorax 10/05/2014 FINDINGS: Cardiovascular: Limited evaluation without intravenous contrast. Aortic atherosclerosis. Post CABG changes. No significant pericardial effusion. Vascular stent in the left axilla. Mediastinum/Nodes: Mild mediastinal adenopathy, for example right paratracheal lymph node measures 12 mm short axis. Small calcified left hilar lymph nodes. Midline trachea. No thyroid mass. Mild air distention of the esophagus. Lungs/Pleura: Small right pleural effusion or pleural thickening. Mildly loculated pleural effusion along the right pulmonary fissures. Ground-glass density and consolidation in the right upper lobe with cavitation, this measures approximately 4.4 by 3 cm. Multiple mostly peripheral bilateral lung nodules, for example left upper lobe lateral subpleural nodule measures 7 mm, series 6, image number 50. Anterior subpleural left upper lobe nodule measuring 14 mm, series 6, image number 75. Left subpleural, posterior 16 mm nodule with mild cavitation. Multiple additional pulmonary nodules.  Upper Abdomen: Atrophic kidneys.  Splenic granuloma. Musculoskeletal: Post sternotomy changes. No acute or suspicious bone lesion. IMPRESSION: 1. Approximate 4.4 cm cavitary lesion in the right upper lobe with surrounding ground-glass density, differential considerations include cavitary infection and cavitary neoplasm. There are multiple additional bilateral pulmonary nodules, some with cavitation. Differential considerations include cavitary infection, septic emboli, and metastatic disease. 2. Small right-sided pleural effusion with loculated fluid along the right pulmonary fissures. 3. Mediastinal adenopathy could be reactive 4. Atrophic kidneys Aortic Atherosclerosis (ICD10-I70.0). Electronically Signed   By: Donavan Foil M.D.   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  Social services at facility was responsible for arranging this appointment.  Pt was provided with a 30 day supply of prescriptions for medications and refills must be obtained from their PCP.  For controlled substances, a more limited supply may be provided adequate until PCP appointment only. Medications have been verified.   Time spent > 30 min;> 50% of time with patient was spent reviewing  records, labs, tests and studies, counseling and developing plan of care  Noah Delaine. Sheppard Coil, MD

## 2017-05-27 ENCOUNTER — Encounter: Payer: Self-pay | Admitting: Internal Medicine

## 2017-05-28 LAB — ACID FAST SMEAR (AFB): ACID FAST SMEAR - AFSCU2: NEGATIVE

## 2017-05-28 LAB — ACID FAST SMEAR (AFB, MYCOBACTERIA)

## 2017-06-05 LAB — FUNGUS CULTURE WITH STAIN

## 2017-06-05 LAB — FUNGUS CULTURE RESULT

## 2017-06-05 LAB — FUNGAL ORGANISM REFLEX

## 2017-06-06 ENCOUNTER — Encounter (HOSPITAL_COMMUNITY): Payer: Self-pay | Admitting: Emergency Medicine

## 2017-06-06 ENCOUNTER — Emergency Department (HOSPITAL_COMMUNITY): Payer: Medicare HMO

## 2017-06-06 ENCOUNTER — Inpatient Hospital Stay (HOSPITAL_COMMUNITY)
Admission: EM | Admit: 2017-06-06 | Discharge: 2017-06-09 | DRG: 189 | Disposition: A | Discharge status: Home / Self Care | Payer: Medicare HMO | Attending: Internal Medicine | Admitting: Internal Medicine

## 2017-06-06 DIAGNOSIS — I10 Essential (primary) hypertension: Secondary | ICD-10-CM | POA: Diagnosis not present

## 2017-06-06 DIAGNOSIS — Z7189 Other specified counseling: Secondary | ICD-10-CM

## 2017-06-06 DIAGNOSIS — E8779 Other fluid overload: Secondary | ICD-10-CM | POA: Diagnosis present

## 2017-06-06 DIAGNOSIS — I252 Old myocardial infarction: Secondary | ICD-10-CM

## 2017-06-06 DIAGNOSIS — Z7982 Long term (current) use of aspirin: Secondary | ICD-10-CM

## 2017-06-06 DIAGNOSIS — N2581 Secondary hyperparathyroidism of renal origin: Secondary | ICD-10-CM | POA: Diagnosis present

## 2017-06-06 DIAGNOSIS — E785 Hyperlipidemia, unspecified: Secondary | ICD-10-CM | POA: Diagnosis present

## 2017-06-06 DIAGNOSIS — Z66 Do not resuscitate: Secondary | ICD-10-CM | POA: Diagnosis present

## 2017-06-06 DIAGNOSIS — J15212 Pneumonia due to Methicillin resistant Staphylococcus aureus: Secondary | ICD-10-CM | POA: Diagnosis present

## 2017-06-06 DIAGNOSIS — S41101S Unspecified open wound of right upper arm, sequela: Secondary | ICD-10-CM

## 2017-06-06 DIAGNOSIS — F0390 Unspecified dementia without behavioral disturbance: Secondary | ICD-10-CM | POA: Diagnosis present

## 2017-06-06 DIAGNOSIS — I1311 Hypertensive heart and chronic kidney disease without heart failure, with stage 5 chronic kidney disease, or end stage renal disease: Secondary | ICD-10-CM | POA: Diagnosis present

## 2017-06-06 DIAGNOSIS — N186 End stage renal disease: Secondary | ICD-10-CM | POA: Diagnosis present

## 2017-06-06 DIAGNOSIS — Z992 Dependence on renal dialysis: Secondary | ICD-10-CM | POA: Diagnosis not present

## 2017-06-06 DIAGNOSIS — E8770 Fluid overload, unspecified: Secondary | ICD-10-CM | POA: Diagnosis present

## 2017-06-06 DIAGNOSIS — Z888 Allergy status to other drugs, medicaments and biological substances status: Secondary | ICD-10-CM

## 2017-06-06 DIAGNOSIS — L8990 Pressure ulcer of unspecified site, unspecified stage: Secondary | ICD-10-CM | POA: Diagnosis present

## 2017-06-06 DIAGNOSIS — I429 Cardiomyopathy, unspecified: Secondary | ICD-10-CM | POA: Diagnosis present

## 2017-06-06 DIAGNOSIS — R531 Weakness: Secondary | ICD-10-CM | POA: Diagnosis not present

## 2017-06-06 DIAGNOSIS — E43 Unspecified severe protein-calorie malnutrition: Secondary | ICD-10-CM | POA: Diagnosis present

## 2017-06-06 DIAGNOSIS — Z8673 Personal history of transient ischemic attack (TIA), and cerebral infarction without residual deficits: Secondary | ICD-10-CM

## 2017-06-06 DIAGNOSIS — I4510 Unspecified right bundle-branch block: Secondary | ICD-10-CM | POA: Diagnosis present

## 2017-06-06 DIAGNOSIS — E8889 Other specified metabolic disorders: Secondary | ICD-10-CM | POA: Diagnosis present

## 2017-06-06 DIAGNOSIS — S50911A Unspecified superficial injury of right forearm, initial encounter: Secondary | ICD-10-CM | POA: Diagnosis present

## 2017-06-06 DIAGNOSIS — I2510 Atherosclerotic heart disease of native coronary artery without angina pectoris: Secondary | ICD-10-CM | POA: Diagnosis present

## 2017-06-06 DIAGNOSIS — R197 Diarrhea, unspecified: Secondary | ICD-10-CM | POA: Diagnosis present

## 2017-06-06 DIAGNOSIS — I509 Heart failure, unspecified: Secondary | ICD-10-CM | POA: Diagnosis present

## 2017-06-06 DIAGNOSIS — E1122 Type 2 diabetes mellitus with diabetic chronic kidney disease: Secondary | ICD-10-CM | POA: Diagnosis present

## 2017-06-06 DIAGNOSIS — D631 Anemia in chronic kidney disease: Secondary | ICD-10-CM | POA: Diagnosis present

## 2017-06-06 DIAGNOSIS — I160 Hypertensive urgency: Secondary | ICD-10-CM | POA: Diagnosis present

## 2017-06-06 DIAGNOSIS — Z951 Presence of aortocoronary bypass graft: Secondary | ICD-10-CM

## 2017-06-06 DIAGNOSIS — E7800 Pure hypercholesterolemia, unspecified: Secondary | ICD-10-CM | POA: Diagnosis not present

## 2017-06-06 DIAGNOSIS — E039 Hypothyroidism, unspecified: Secondary | ICD-10-CM | POA: Diagnosis present

## 2017-06-06 DIAGNOSIS — J810 Acute pulmonary edema: Secondary | ICD-10-CM | POA: Diagnosis present

## 2017-06-06 DIAGNOSIS — G92 Toxic encephalopathy: Secondary | ICD-10-CM | POA: Diagnosis present

## 2017-06-06 DIAGNOSIS — I25811 Atherosclerosis of native coronary artery of transplanted heart without angina pectoris: Secondary | ICD-10-CM | POA: Diagnosis not present

## 2017-06-06 DIAGNOSIS — I119 Hypertensive heart disease without heart failure: Secondary | ICD-10-CM | POA: Diagnosis present

## 2017-06-06 DIAGNOSIS — E118 Type 2 diabetes mellitus with unspecified complications: Secondary | ICD-10-CM | POA: Diagnosis not present

## 2017-06-06 DIAGNOSIS — D72829 Elevated white blood cell count, unspecified: Secondary | ICD-10-CM | POA: Diagnosis present

## 2017-06-06 DIAGNOSIS — J9601 Acute respiratory failure with hypoxia: Secondary | ICD-10-CM | POA: Diagnosis not present

## 2017-06-06 DIAGNOSIS — Z941 Heart transplant status: Secondary | ICD-10-CM

## 2017-06-06 DIAGNOSIS — Z79899 Other long term (current) drug therapy: Secondary | ICD-10-CM

## 2017-06-06 DIAGNOSIS — T368X6A Underdosing of other systemic antibiotics, initial encounter: Secondary | ICD-10-CM | POA: Diagnosis present

## 2017-06-06 DIAGNOSIS — X58XXXA Exposure to other specified factors, initial encounter: Secondary | ICD-10-CM | POA: Diagnosis present

## 2017-06-06 DIAGNOSIS — F439 Reaction to severe stress, unspecified: Secondary | ICD-10-CM | POA: Diagnosis present

## 2017-06-06 DIAGNOSIS — Z9115 Patient's noncompliance with renal dialysis: Secondary | ICD-10-CM

## 2017-06-06 DIAGNOSIS — Z8679 Personal history of other diseases of the circulatory system: Secondary | ICD-10-CM

## 2017-06-06 DIAGNOSIS — Z515 Encounter for palliative care: Secondary | ICD-10-CM | POA: Diagnosis present

## 2017-06-06 DIAGNOSIS — Z791 Long term (current) use of non-steroidal anti-inflammatories (NSAID): Secondary | ICD-10-CM

## 2017-06-06 DIAGNOSIS — H9190 Unspecified hearing loss, unspecified ear: Secondary | ICD-10-CM | POA: Diagnosis present

## 2017-06-06 DIAGNOSIS — J811 Chronic pulmonary edema: Secondary | ICD-10-CM | POA: Diagnosis present

## 2017-06-06 DIAGNOSIS — Z7401 Bed confinement status: Secondary | ICD-10-CM

## 2017-06-06 LAB — CBC WITH DIFFERENTIAL/PLATELET
BASOS ABS: 0.1 10*3/uL (ref 0.0–0.1)
BASOS PCT: 1 %
EOS ABS: 0.2 10*3/uL (ref 0.0–0.7)
Eosinophils Relative: 2 %
HEMATOCRIT: 32.1 % — AB (ref 39.0–52.0)
HEMOGLOBIN: 10 g/dL — AB (ref 13.0–17.0)
Lymphocytes Relative: 26 %
Lymphs Abs: 3 10*3/uL (ref 0.7–4.0)
MCH: 30.8 pg (ref 26.0–34.0)
MCHC: 31.2 g/dL (ref 30.0–36.0)
MCV: 98.8 fL (ref 78.0–100.0)
MONO ABS: 0.8 10*3/uL (ref 0.1–1.0)
Monocytes Relative: 7 %
NEUTROS ABS: 7.6 10*3/uL (ref 1.7–7.7)
NEUTROS PCT: 64 %
Platelets: 319 10*3/uL (ref 150–400)
RBC: 3.25 MIL/uL — ABNORMAL LOW (ref 4.22–5.81)
RDW: 18 % — AB (ref 11.5–15.5)
WBC: 11.7 10*3/uL — ABNORMAL HIGH (ref 4.0–10.5)

## 2017-06-06 LAB — BASIC METABOLIC PANEL
ANION GAP: 13 (ref 5–15)
BUN: 54 mg/dL — ABNORMAL HIGH (ref 6–20)
CALCIUM: 8.8 mg/dL — AB (ref 8.9–10.3)
CO2: 26 mmol/L (ref 22–32)
CREATININE: 9.14 mg/dL — AB (ref 0.61–1.24)
Chloride: 98 mmol/L — ABNORMAL LOW (ref 101–111)
GFR, EST AFRICAN AMERICAN: 6 mL/min — AB (ref >60)
GFR, EST NON AFRICAN AMERICAN: 5 mL/min — AB (ref >60)
Glucose, Bld: 132 mg/dL — ABNORMAL HIGH (ref 65–99)
Potassium: 4.7 mmol/L (ref 3.5–5.1)
SODIUM: 137 mmol/L (ref 135–145)

## 2017-06-06 LAB — I-STAT CHEM 8, ED
BUN: 51 mg/dL — ABNORMAL HIGH (ref 6–20)
CALCIUM ION: 1.05 mmol/L — AB (ref 1.15–1.40)
Chloride: 99 mmol/L — ABNORMAL LOW (ref 101–111)
Creatinine, Ser: 9.3 mg/dL — ABNORMAL HIGH (ref 0.61–1.24)
Glucose, Bld: 130 mg/dL — ABNORMAL HIGH (ref 65–99)
HEMATOCRIT: 33 % — AB (ref 39.0–52.0)
HEMOGLOBIN: 11.2 g/dL — AB (ref 13.0–17.0)
Potassium: 4.7 mmol/L (ref 3.5–5.1)
SODIUM: 137 mmol/L (ref 135–145)
TCO2: 27 mmol/L (ref 0–100)

## 2017-06-06 LAB — TROPONIN I

## 2017-06-06 LAB — BRAIN NATRIURETIC PEPTIDE: B NATRIURETIC PEPTIDE 5: 2817.5 pg/mL — AB (ref 0.0–100.0)

## 2017-06-06 MED ORDER — MYCOPHENOLATE MOFETIL 250 MG PO CAPS
250.0000 mg | ORAL_CAPSULE | ORAL | Status: DC
  Administered 2017-06-08: 250 mg via ORAL
  Filled 2017-06-06: qty 1

## 2017-06-06 MED ORDER — METOPROLOL TARTRATE 50 MG PO TABS
50.0000 mg | ORAL_TABLET | Freq: Two times a day (BID) | ORAL | Status: DC
  Administered 2017-06-07: 50 mg via ORAL

## 2017-06-06 MED ORDER — ALBUTEROL SULFATE (2.5 MG/3ML) 0.083% IN NEBU: 2.5000 mg | INHALATION_SOLUTION | RESPIRATORY_TRACT | Status: DC | PRN

## 2017-06-06 MED ORDER — PENTAFLUOROPROP-TETRAFLUOROETH EX AERO: 1.0000 "application " | INHALATION_SPRAY | CUTANEOUS | Status: DC | PRN

## 2017-06-06 MED ORDER — RENA-VITE PO TABS
1.0000 | ORAL_TABLET | Freq: Every day | ORAL | Status: DC
  Administered 2017-06-07 – 2017-06-08 (×3): 1 via ORAL
  Filled 2017-06-06 (×3): qty 1

## 2017-06-06 MED ORDER — ASPIRIN EC 81 MG PO TBEC
81.0000 mg | DELAYED_RELEASE_TABLET | Freq: Every day | ORAL | Status: DC
  Administered 2017-06-07 – 2017-06-08 (×3): 81 mg via ORAL
  Filled 2017-06-06 (×3): qty 1

## 2017-06-06 MED ORDER — CALCIUM ACETATE (PHOS BINDER) 667 MG PO CAPS
667.0000 mg | ORAL_CAPSULE | Freq: Three times a day (TID) | ORAL | Status: DC
  Administered 2017-06-07 – 2017-06-08 (×3): 667 mg via ORAL
  Filled 2017-06-06 (×3): qty 1

## 2017-06-06 MED ORDER — MYCOPHENOLATE MOFETIL 250 MG PO CAPS
500.0000 mg | ORAL_CAPSULE | ORAL | Status: DC
  Administered 2017-06-08: 500 mg via ORAL

## 2017-06-06 MED ORDER — NEPRO/CARBSTEADY PO LIQD
237.0000 mL | Freq: Two times a day (BID) | ORAL | Status: DC
  Administered 2017-06-07 – 2017-06-08 (×4): 237 mL via ORAL

## 2017-06-06 MED ORDER — FLUOXETINE HCL 20 MG PO CAPS
20.0000 mg | ORAL_CAPSULE | Freq: Every day | ORAL | Status: DC
  Administered 2017-06-07 – 2017-06-09 (×3): 20 mg via ORAL
  Filled 2017-06-06 (×3): qty 1

## 2017-06-06 MED ORDER — DONEPEZIL HCL 10 MG PO TABS
10.0000 mg | ORAL_TABLET | Freq: Every day | ORAL | Status: DC
  Administered 2017-06-07 – 2017-06-08 (×3): 10 mg via ORAL
  Filled 2017-06-06 (×3): qty 1

## 2017-06-06 MED ORDER — HEPARIN SODIUM (PORCINE) 5000 UNIT/ML IJ SOLN
5000.0000 [IU] | Freq: Three times a day (TID) | INTRAMUSCULAR | Status: DC
  Administered 2017-06-07 – 2017-06-09 (×7): 5000 [IU] via SUBCUTANEOUS
  Filled 2017-06-06 (×9): qty 1

## 2017-06-06 MED ORDER — ONDANSETRON HCL 4 MG PO TABS: 4.0000 mg | ORAL_TABLET | Freq: Four times a day (QID) | ORAL | Status: DC | PRN

## 2017-06-06 MED ORDER — LEVOTHYROXINE SODIUM 75 MCG PO TABS
75.0000 ug | ORAL_TABLET | Freq: Every day | ORAL | Status: DC
  Administered 2017-06-07 – 2017-06-09 (×3): 75 ug via ORAL
  Filled 2017-06-06 (×3): qty 1

## 2017-06-06 MED ORDER — VANCOMYCIN HCL 500 MG IV SOLR: 500.0000 mg | Freq: Once | INTRAVENOUS | Status: DC

## 2017-06-06 MED ORDER — ACETAMINOPHEN 325 MG PO TABS
650.0000 mg | ORAL_TABLET | Freq: Four times a day (QID) | ORAL | Status: DC | PRN
  Administered 2017-06-07 – 2017-06-09 (×3): 650 mg via ORAL
  Filled 2017-06-06: qty 2

## 2017-06-06 MED ORDER — LIDOCAINE-PRILOCAINE 2.5-2.5 % EX CREA: 1.0000 "application " | TOPICAL_CREAM | CUTANEOUS | Status: DC | PRN

## 2017-06-06 MED ORDER — PRAVASTATIN SODIUM 20 MG PO TABS
10.0000 mg | ORAL_TABLET | Freq: Every day | ORAL | Status: DC
  Administered 2017-06-07 – 2017-06-08 (×3): 10 mg via ORAL
  Filled 2017-06-06 (×3): qty 1

## 2017-06-06 MED ORDER — SODIUM CHLORIDE 0.9 % IV SOLN: 100.0000 mL | INTRAVENOUS | Status: DC | PRN

## 2017-06-06 MED ORDER — MYCOPHENOLATE MOFETIL 250 MG PO CAPS
500.0000 mg | ORAL_CAPSULE | ORAL | Status: DC
  Administered 2017-06-07 – 2017-06-09 (×3): 500 mg via ORAL
  Filled 2017-06-06 (×5): qty 2

## 2017-06-06 MED ORDER — HYDRALAZINE HCL 20 MG/ML IJ SOLN
10.0000 mg | INTRAMUSCULAR | Status: DC | PRN
  Administered 2017-06-08: 10 mg via INTRAVENOUS
  Filled 2017-06-06 (×2): qty 1

## 2017-06-06 MED ORDER — METOPROLOL TARTRATE 50 MG PO TABS
50.0000 mg | ORAL_TABLET | Freq: Two times a day (BID) | ORAL | Status: DC
  Administered 2017-06-07 – 2017-06-09 (×5): 50 mg via ORAL
  Filled 2017-06-06 (×6): qty 1

## 2017-06-06 MED ORDER — VITAMIN D 1000 UNITS PO TABS
1000.0000 [IU] | ORAL_TABLET | Freq: Every day | ORAL | Status: DC
  Administered 2017-06-07 – 2017-06-09 (×3): 1000 [IU] via ORAL
  Filled 2017-06-06 (×3): qty 1

## 2017-06-06 MED ORDER — CYCLOSPORINE MODIFIED (NEORAL) 25 MG PO CAPS: 50.0000 mg | ORAL_CAPSULE | ORAL | Status: DC

## 2017-06-06 MED ORDER — PANTOPRAZOLE SODIUM 40 MG PO TBEC
40.0000 mg | DELAYED_RELEASE_TABLET | Freq: Every day | ORAL | Status: DC
  Administered 2017-06-07 – 2017-06-09 (×3): 40 mg via ORAL
  Filled 2017-06-06 (×3): qty 1

## 2017-06-06 MED ORDER — INSULIN ASPART 100 UNIT/ML ~~LOC~~ SOLN
0.0000 [IU] | Freq: Three times a day (TID) | SUBCUTANEOUS | Status: DC
  Administered 2017-06-08: 3 [IU] via SUBCUTANEOUS

## 2017-06-06 MED ORDER — VANCOMYCIN HCL IN DEXTROSE 500-5 MG/100ML-% IV SOLN
INTRAVENOUS | Status: AC
  Administered 2017-06-06: 500 mg
  Filled 2017-06-06: qty 100

## 2017-06-06 MED ORDER — SENNOSIDES-DOCUSATE SODIUM 8.6-50 MG PO TABS: 1.0000 | ORAL_TABLET | Freq: Every evening | ORAL | Status: DC | PRN

## 2017-06-06 MED ORDER — MYCOPHENOLATE MOFETIL 250 MG PO CAPS: 250.0000 mg | ORAL_CAPSULE | Freq: Every day | ORAL | Status: DC

## 2017-06-06 MED ORDER — ALTEPLASE 2 MG IJ SOLR: 2.0000 mg | Freq: Once | INTRAMUSCULAR | Status: DC | PRN

## 2017-06-06 MED ORDER — VITAMIN B-12 1000 MCG PO TABS
1000.0000 ug | ORAL_TABLET | Freq: Every day | ORAL | Status: DC
  Administered 2017-06-07 – 2017-06-09 (×3): 1000 ug via ORAL
  Filled 2017-06-06 (×3): qty 1

## 2017-06-06 MED ORDER — TRAZODONE HCL 50 MG PO TABS: 25.0000 mg | ORAL_TABLET | Freq: Every evening | ORAL | Status: DC | PRN

## 2017-06-06 MED ORDER — LIDOCAINE HCL (PF) 1 % IJ SOLN: 5.0000 mL | INTRAMUSCULAR | Status: DC | PRN

## 2017-06-06 MED ORDER — VANCOMYCIN HCL IN DEXTROSE 1-5 GM/200ML-% IV SOLN: 1000.0000 mg | Freq: Once | INTRAVENOUS | Status: DC

## 2017-06-06 MED ORDER — RISAQUAD PO CAPS
1.0000 | ORAL_CAPSULE | Freq: Every day | ORAL | Status: DC
  Administered 2017-06-07 – 2017-06-09 (×3): 1 via ORAL
  Filled 2017-06-06 (×3): qty 1

## 2017-06-06 MED ORDER — ACETAMINOPHEN 650 MG RE SUPP: 650.0000 mg | Freq: Four times a day (QID) | RECTAL | Status: DC | PRN

## 2017-06-06 MED ORDER — DIPHENOXYLATE-ATROPINE 2.5-0.025 MG PO TABS
1.0000 | ORAL_TABLET | Freq: Four times a day (QID) | ORAL | Status: DC | PRN
  Administered 2017-06-07: 1 via ORAL
  Filled 2017-06-06: qty 1

## 2017-06-06 MED ORDER — ONDANSETRON HCL 4 MG/2ML IJ SOLN: 4.0000 mg | Freq: Four times a day (QID) | INTRAMUSCULAR | Status: DC | PRN

## 2017-06-06 MED ORDER — HEPARIN SODIUM (PORCINE) 1000 UNIT/ML DIALYSIS: 1000.0000 [IU] | INTRAMUSCULAR | Status: DC | PRN

## 2017-06-06 NOTE — ED Notes (Signed)
Hospitalist at bedside again. Unable to transport patient until assessment complete.

## 2017-06-06 NOTE — Progress Notes (Addendum)
Consult request has been received. CSW attempting to follow up at present time.  Per [facility] ED EDP pt has been D/C'd from a SNF last Friday 7/13.  Since then pt, per EDP, has not been to dialysis and is in the hospital as a result.  Per EDP, pt was using [facility]  Per EDP, pt reports [facility] has stopped giving pt a ride.  This CSW believes [facility] is only used by pt's with Medicaid and pt does not have Medicaid.  CSW is unsure why pt has [facility] without Medicaid.  Another option may be for the pt to contact:  [address] [place], [place] [address] [phone]  Pt can inquire about private pay options.  CSW will leave handoff for medical floor CSW's as pt is being admitted, per EDP.  4:45 PM CSW spoke to the pt's spouse who stated [facility] transportation is $85 a day for roundtrip drive to dialysis and back and pt's spouse states this is not a viable option. CSW suggested pt's spouse contact other dialysis centers to see if they provide transportation with dialysis and informed pt's spouse CSW will leave handoff for medical floor CSW.  CSW informed pt's spouse that if she doesn't hear from [facility] CSW to ask pt's RN to speak to social work. Please reconsult if future social work needs arise.    Pt's spouse stated she would like to be contacted on her home phone at ph: [phone].   Alphonse Guild. Venita Seng, LCSWA, LCAS, CSI Clinical Social Worker covering [facility] ED from [facility] ED Ph: [phone]

## 2017-06-06 NOTE — ED Notes (Signed)
Dr. Dolly Rias at bedside at this time.

## 2017-06-06 NOTE — ED Provider Notes (Signed)
[facility] DEPT Provider Note   CSN: [id number] Arrival date & time: 06/06/17  1343     History   Chief Complaint Chief Complaint  Patient presents with  . Weakness  . Hypertension    HPI [name] [name] is a 76 y.o. male.  76 year old male with a past medical history of end-stage renal disease on dialysis who presents to the emergency department secondary to dialysis since last Thursday. His discharge from skilled nursing facility at that time and has not had transportation to the dialysis center. He presents today with worsening shortness of breath and no leg swelling. Also with worsening weakness. No fevers, cough or other symptoms.    Weakness     Past Medical History:  Diagnosis Date  . Anemia   . Cancer ([place])    SKIN  . CKD III-IV    a. since transplant in 2001 with significant worsening since 06/2013.  Marland Kitchen Coronary artery disease   . Dementia    mild  . Diabetes mellitus without complication (HCC)    FASTING CBG 90S  . Headache(784.0)   . 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 1995 CABG x 4 in [place], [place];  b. 2001 Developed CHF;  c. 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 10/2000 Cardiac transplant @ [facility], [place];  e. Historically nl Bx and stress testing.  Last stress test ~ 5 yrs ago.  Marland Kitchen Hernia of abdominal wall   . History of blood transfusion   . History of stroke   . Hypercalcemia    a. 06/2013 Hospitalized in [place].  Marland Kitchen Hyperlipidemia   . Hypertension    a. Long h/o HTN, came off of all meds following transplant/wt loss-->meds resumed 06/2013, difficult to control since.  . Myocardial infarction ([place])    PRIOR TO HEART TRANSPLANT  . Orthostatic dizziness   . Orthostatic hypotension    a. Has tried support hose and compression stockings - prefers not to wear.  . Shortness of breath    EXERTION  . Stroke ([place]) 06/2013   NO RESIDUAL - WAS CONFUSED AT TIME - THAT HAS RESOLVED    Patient Active Problem List   Diagnosis Date Noted  . Coronary artery  disease 05/19/2017  . MRSA infection   . Bacteremia   . Pressure injury of skin 05/02/2017  . Cavitary lesion of lung   . HCAP (healthcare-associated pneumonia) 05/01/2017  . Malnutrition of moderate degree 04/25/2017  . Streptococcal bacteremia 04/22/2017  . Acute metabolic encephalopathy 26/71/2458  . Volume overload 02/03/2017  . Generalized weakness 08/22/2016  . Urinary tract infection   . Syncope and collapse   . Syncope 08/07/2016  . Faintness   . Uncontrolled hypertension   . Acute blood loss anemia   . Cardiomyopathy ([place])   . Controlled diabetes mellitus type 2 with complications ([place])   . 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 Traumatic subdural hematoma ([employer]) 05/04/2016  . 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 Subdural hematoma ([place]) 05/02/2016  . CKD (chronic kidney disease)   . Protein-calorie malnutrition, severe ([place]) 04/23/2015  . Anemia of chronic disease 04/23/2015  . Depression 04/23/2015  . UTI (urinary tract infection) 04/22/2015  . Barrett's esophagus 03/22/2015  . ESRD on dialysis ([place]) 10/12/2014  . DM (diabetes mellitus), type 2 with renal complications ([place]) 09/98/3382  . Hypothyroidism 10/06/2014  . Heart transplant, orthotopic, status ([place])   . Hypertensive heart disease   . Hypercholesterolemia 06/20/2014  . 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 BASCILIC VEIN TRANSPOSITION Left 07/06/2014   Procedure: [place];  Surgeon: Rosetta Posner, MD;  Location: [facility];  Service: Vascular;  Laterality: Left;  . CARDIAC SURGERY    . CATARACT EXTRACTION    . CORONARY ARTERY BYPASS GRAFT    . CRANIOTOMY Right 05/04/2016   Procedure: Right Craniotomy for Evacuation of Subdural Hematoma;  Surgeon: Earnie Larsson, MD;  Location: [facility] NEURO ORS;  Service: Neurosurgery;  Laterality: Right;  . EYE SURGERY Bilateral    CATARACTS  .  HEART TRANSPLANT    . PERIPHERAL VASCULAR CATHETERIZATION N/A 09/06/2016   Procedure: Fistulagram - left arm;  Surgeon: Waynetta Sandy, MD;  Location: [facility] CV LAB;  Service: Cardiovascular;  Laterality: N/A;  . PERIPHERAL VASCULAR CATHETERIZATION Left 09/06/2016   Procedure: Peripheral Vascular Intervention;  Surgeon: Waynetta Sandy, MD;  Location: [facility] CV LAB;  Service: Cardiovascular;  Laterality: Left;  UPPER ARM COVERED STENT  . REVISON OF ARTERIOVENOUS FISTULA Left 05/04/2017   Procedure: REMOVAL OF ULCERATION OF ARTERIOVENOUS FISTULA;  Surgeon: Rosetta Posner, MD;  Location: [facility];  Service: Vascular;  Laterality: Left;  . SKIN GRAFT FULL THICKNESS LEG     cat scratch that did not heal  . TEE WITHOUT CARDIOVERSION N/A 05/07/2017   Procedure: TRANSESOPHAGEAL ECHOCARDIOGRAM (TEE);  Surgeon: Sueanne Margarita, MD;  Location: [facility];  Service: Cardiovascular;  Laterality: N/A;  . VASECTOMY    . VIDEO BRONCHOSCOPY  05/04/2017   Procedure: BRONCHIAL LAVAGE;  Surgeon: Javier Glazier, MD;  Location: [facility];  Service: Cardiopulmonary;;       Home Medications    Prior to Admission medications   Medication Sig Start Date End Date Taking? Authorizing Provider  acetaminophen (TYLENOL) 325 MG tablet Take 2 tablets (650 mg total) by mouth every 6 (six) hours as needed for mild pain (or Fever >/= 101). 10/14/14   Delfina Redwood, MD  aspirin EC 81 MG tablet Take 81 mg by mouth at bedtime.    [provider]  calcium acetate (PHOSLO) 667 MG capsule Take 1 capsule (667 mg total) by mouth 3 (three) times daily with meals. 08/24/16   Ghimire, Henreitta Leber, MD  cholecalciferol (VITAMIN D) 1000 UNITS tablet Take 1,000 Units by mouth daily.    [provider]  cycloSPORINE modified (NEORAL) 25 MG capsule Take 50-75 mg by mouth See admin instructions. 75 mg in the morning and 50 mg in the evening 08/18/14   Lelon Perla, MD  diphenoxylate-atropine  (LOMOTIL) 2.5-0.025 MG tablet Take 1 tablet by mouth 4 (four) times daily as needed for diarrhea or loose stools. 08/24/16   Ghimire, Henreitta Leber, MD  donepezil (ARICEPT) 10 MG tablet Take 10 mg by mouth at bedtime.  03/01/15   [provider]  FLUoxetine (PROZAC) 20 MG capsule Take 20 mg by mouth daily.     [provider]  Fluticasone Furoate 50 MCG/ACT AEPB Inhale into the lungs.    [provider]  hydrALAZINE (APRESOLINE) 25 MG tablet Take 1 tablet (25 mg total) by mouth 3 (three) times daily. 04/25/17   Patrecia Pour, MD  lansoprazole (PREVACID) 30 MG capsule Take 30 mg by mouth 2 (two) times daily before a meal.  12/22/15   [provider]  levothyroxine (SYNTHROID, LEVOTHROID) 75 MCG tablet Take 75 mcg by mouth daily.     [provider]  metoprolol tartrate (LOPRESSOR) 25 MG tablet Take 2 tablets (50 mg total) by mouth 2 (two) times daily. 12/27/14   Rai, Vernelle Emerald, MD  multivitamin (RENA-VIT) TABS tablet Take 1 tablet by mouth daily.  03/08/15   [provider]  mycophenolate (CELLCEPT) 250 MG capsule Take 250-500 mg by mouth daily. Take 500 mg every morning and take 250 mg every evening on Sunday, Monday, Wednesday and Friday then take 500 mg every evening on Tuesday Thursday and Saturday    [provider]  naproxen (NAPROSYN) 500 MG tablet Take 500 mg by mouth 2 (two) times daily with a meal.    [provider]  Nutritional Supplements (FEEDING SUPPLEMENT, NEPRO CARB STEADY,) LIQD Take 237 mLs by mouth 2 (two) times daily between meals. 04/25/17   Patrecia Pour, MD  pravastatin (PRAVACHOL) 10 MG tablet Take 10 mg by mouth at bedtime.     [provider]  Probiotic Product (PRO-BIOTIC BLEND) CAPS Take 1 capsule by mouth daily.    [provider]  vancomycin 500 mg in sodium chloride 0.9 % 100 mL Inject 500 mg into the vein Every Tuesday,Thursday,and Saturday with dialysis. 05/08/17 06/11/17  Nita Sells, MD   vitamin B-12 (CYANOCOBALAMIN) 1000 MCG tablet Take 1,000 mcg by mouth daily with breakfast.    [provider]    Family History Family History  Problem Relation Age of Onset  . Heart attack Father        died @ 37  . Diabetes Father   . Heart attack Mother   . Diabetes Mother   . Kidney disease Maternal Aunt     Social History Social History  Substance Use Topics  . Smoking status: Never Smoker  . Smokeless tobacco: Never Used  . Alcohol use No     Allergies   Norvasc [amlodipine besylate]   Review of Systems Review of Systems  Neurological: Positive for weakness.  All other systems reviewed and are negative.    Physical Exam Updated Vital Signs There were no vitals taken for this visit.  Physical Exam  Constitutional: He is oriented to person, place, and time. He appears well-developed and well-nourished.  HENT:  Head: Normocephalic and atraumatic.  Eyes: Conjunctivae and EOM are normal.  Neck: Normal range of motion.  Cardiovascular: Normal rate.   Pulmonary/Chest: Effort normal. Tachypnea noted. No respiratory distress. He has rales.  Abdominal: Soft. He exhibits no distension.  Musculoskeletal: Normal range of motion.  Neurological: He is alert and oriented to person, place, and time. No cranial nerve deficit. Coordination normal.  Skin: Skin is warm and dry.  Nursing note and vitals reviewed.    ED Treatments / Results  Labs (all labs ordered are listed, but only abnormal results are displayed) Labs Reviewed  CBC WITH DIFFERENTIAL/PLATELET  BASIC METABOLIC PANEL  TROPONIN I  BRAIN NATRIURETIC PEPTIDE  I-STAT CHEM 8, ED    EKG  EKG Interpretation None       Radiology No results found.  Procedures Procedures (including critical care time)  Medications Ordered in ED Medications - No data to display   Initial Impression / Assessment and Plan / ED Course  I have reviewed the triage vital signs and the nursing  notes.  Pertinent labs & imaging results that were available during my care of the patient were reviewed by me and considered in my medical decision making (see chart for details).     Patient with fluid overload so discussed with nephrology who will perform  dialysis tonight. He also requested admission to medicine to continue dialysis and possibly work on the Education officer, museum aspect of this patient. I discussed with social work who will start working on this as well.  Final Clinical Impressions(s) / ED Diagnoses   Final diagnoses:  Other hypervolemia  Hypertensive urgency     Kyland No, Corene Cornea, MD 06/06/17 1701

## 2017-06-06 NOTE — H&P (Signed)
History and Physical  [name] [name] WER:[id number] DOB: 17-Feb-1941 DOA: 06/06/2017  Referring physician: Dolly Rias, MD PCP: Junie Panning, NP   Chief Complaint: Missed HD, SOB  HPI: [name] [name] is a 76 y.o. male with ESRD on hemodialysis TTS, CAD, DM, HTN, s/p heart transplant (2001) was recently discharged from [facility] for acute rehab treatment.  Apparently he had been very weak over there and had not been ambulating well.  Unfortunately after arriving home his family has had a difficult time getting him out of bed so that they could take him to his dialysis treatments.  They are saying that patient has become bed bound and unable to stand.  His legs are very weak.  They can't get him into a car.  He has missed several dialysis treatments.  His last HD treatment was 7/12.  They are unable to afford to have ambulance transport for his hemodialysis treatments and cannot afford an hourly caregiver.  His wife is ill with heart failure and had recent MI.  They do not qualify for medicaid benefits.    He was recently hospitalized and treated for a MRSA cavitary pneumonia and he has been on vancomycin given during HD treatments.  He was supposed to take vancomycin thru 06/11/17 during HD.  His last vanc dose was 7/12.  Upon review of records, TB and fungal infection was ruled out after he had a bronchoscopy done during the last admission.  He had been doing fairly well except for his chronic frequent diarrhea, poor appetite, poor oral consumption which has increased in the past couple of days.  He denies chest pain and shortness of breath.    ED course: He was seen by nephrology and he will be started on immediate hemodialysis treatments.  His chest xray was positive for pulmonary edema.    Severity of Illness: The appropriate patient status for this patient is INPATIENT. Inpatient status is judged to be reasonable and necessary in order to provide the required intensity of service to  ensure the patient's safety. The patient's presenting symptoms, physical exam findings, and initial radiographic and laboratory data in the context of their chronic comorbidities is felt to place them at high risk for further clinical deterioration. Furthermore, it is not anticipated that the patient will be medically stable for discharge from the hospital within 2 midnights of admission. The following factors support the patient status of inpatient.   " The patient's presenting symptoms include SOB, malaise, diarrhea, poor appetite, missed HD treatments. " The worrisome physical exam findings include crackles, peripheral edema, elevated SBP. " The initial radiographic and laboratory data are worrisome because of elevated BNP, creatinine, leukocytosis, pulmonary edema on chest xray. " The chronic co-morbidities include ESRD, DM, CAD, HTN.  * I certify that at the point of admission it is my clinical judgment that the patient will require inpatient hospital care spanning beyond 2 midnights from the point of admission due to high intensity of service, high risk for further deterioration and high frequency of surveillance required.*  Review of Systems: All systems reviewed and apart from history of presenting illness, are negative.  Past Medical History:  Diagnosis Date  . Anemia   . Cancer ([place])    SKIN  . CKD III-IV    a. since transplant in 2001 with significant worsening since 06/2013.  Marland Kitchen Coronary artery disease   . Dementia    mild  . Diabetes mellitus without complication (HCC)    FASTING CBG 90S  .  Headache(784.0)   . Heart transplanted ([place])    a. 1995 CABG x 4 in [place], [place];  b. 2001 Developed CHF;  c. 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 10/2000 Cardiac transplant @ [facility], [place];  e. Historically nl Bx and stress testing.  Last stress test ~ 5 yrs ago.  Marland Kitchen Hernia of abdominal wall   . History of blood transfusion   . History of stroke   . Hypercalcemia    a. 06/2013  Hospitalized in [place].  Marland Kitchen Hyperlipidemia   . Hypertension    a. Long h/o HTN, came off of all meds following transplant/wt loss-->meds resumed 06/2013, difficult to control since.  . Myocardial infarction ([place])    PRIOR TO HEART TRANSPLANT  . Orthostatic dizziness   . Orthostatic hypotension    a. Has tried support hose and compression stockings - prefers not to wear.  . Shortness of breath    EXERTION  . Stroke ([place]) 06/2013   NO RESIDUAL - WAS CONFUSED AT TIME - THAT HAS RESOLVED   Past Surgical History:  Procedure Laterality Date  . BASCILIC VEIN TRANSPOSITION Left 07/06/2014   Procedure: [place];  Surgeon: Rosetta Posner, MD;  Location: [place];  Service: Vascular;  Laterality: Left;  . CARDIAC SURGERY    . CATARACT EXTRACTION    . CORONARY ARTERY BYPASS GRAFT    . CRANIOTOMY Right 05/04/2016   Procedure: Right Craniotomy for Evacuation of Subdural Hematoma;  Surgeon: Earnie Larsson, MD;  Location: [place] NEURO ORS;  Service: Neurosurgery;  Laterality: Right;  . EYE SURGERY Bilateral    CATARACTS  . HEART TRANSPLANT    . PERIPHERAL VASCULAR CATHETERIZATION N/A 09/06/2016   Procedure: Fistulagram - left arm;  Surgeon: Waynetta Sandy, MD;  Location: [place] CV LAB;  Service: Cardiovascular;  Laterality: N/A;  . PERIPHERAL VASCULAR CATHETERIZATION Left 09/06/2016   Procedure: Peripheral Vascular Intervention;  Surgeon: Waynetta Sandy, MD;  Location: [place] CV LAB;  Service: Cardiovascular;  Laterality: Left;  UPPER ARM COVERED STENT  . REVISON OF ARTERIOVENOUS FISTULA Left 05/04/2017   Procedure: REMOVAL OF ULCERATION OF ARTERIOVENOUS FISTULA;  Surgeon: Rosetta Posner, MD;  Location: [place];  Service: Vascular;  Laterality: Left;  . SKIN GRAFT FULL THICKNESS LEG     cat scratch that did not heal  . TEE WITHOUT CARDIOVERSION N/A 05/07/2017   Procedure: TRANSESOPHAGEAL ECHOCARDIOGRAM (TEE);  Surgeon: Sueanne Margarita, MD;  Location: [place];  Service:  Cardiovascular;  Laterality: N/A;  . VASECTOMY    . VIDEO BRONCHOSCOPY  05/04/2017   Procedure: BRONCHIAL LAVAGE;  Surgeon: Javier Glazier, MD;  Location: [place];  Service: Cardiopulmonary;;   Social History:  reports that he has never smoked. He has never used smokeless tobacco. He reports that he does not drink alcohol or use drugs.  Allergies  Allergen Reactions  . Norvasc [Amlodipine Besylate] Swelling and Other (See Comments)    Swelling of the lower body     Family History  Problem Relation Age of Onset  . Heart attack Father        died @ 21  . Diabetes Father   . Heart attack Mother   . Diabetes Mother   . Kidney disease Maternal Aunt     Prior to Admission medications   Medication Sig Start Date End Date Taking? Authorizing Provider  acetaminophen (TYLENOL) 325 MG tablet Take 2 tablets (650 mg total) by mouth every 6 (six) hours as needed for mild  pain (or Fever >/= 101). Patient taking differently: Take 325-650 mg by mouth 2 (two) times daily.  10/14/14  Yes Delfina Redwood, MD  aspirin EC 81 MG tablet Take 81 mg by mouth at bedtime.   Yes [provider]  calcium acetate (PHOSLO) 667 MG capsule Take 1 capsule (667 mg total) by mouth 3 (three) times daily with meals. 08/24/16  Yes Ghimire, Henreitta Leber, MD  cholecalciferol (VITAMIN D) 1000 UNITS tablet Take 1,000 Units by mouth daily.   Yes [provider]  cycloSPORINE modified (NEORAL) 25 MG capsule Take 50-75 mg by mouth See admin instructions. 75 mg in the morning and 50 mg in the evening 08/18/14  Yes Crenshaw, Denice Bors, MD  diphenoxylate-atropine (LOMOTIL) 2.5-0.025 MG tablet Take 1 tablet by mouth 4 (four) times daily as needed for diarrhea or loose stools. 08/24/16  Yes Ghimire, Henreitta Leber, MD  donepezil (ARICEPT) 10 MG tablet Take 10 mg by mouth at bedtime.  03/01/15  Yes [provider]  FLUoxetine (PROZAC) 20 MG capsule Take 20 mg by mouth daily.    Yes [provider]  ibuprofen  (ADVIL,MOTRIN) 200 MG tablet Take 200 mg by mouth every 6 (six) hours as needed for moderate pain.   Yes [provider]  lansoprazole (PREVACID) 30 MG capsule Take 30 mg by mouth 4 (four) times a week. Sunday, Monday, Wednesday, Friday 12/22/15  Yes [provider]  levothyroxine (SYNTHROID, LEVOTHROID) 75 MCG tablet Take 75 mcg by mouth daily.    Yes [provider]  metoprolol tartrate (LOPRESSOR) 25 MG tablet Take 2 tablets (50 mg total) by mouth 2 (two) times daily. 12/27/14  Yes Rai, Ripudeep K, MD  multivitamin (RENA-VIT) TABS tablet Take 1 tablet by mouth daily.  03/08/15  Yes [provider]  mycophenolate (CELLCEPT) 250 MG capsule Take 250-500 mg by mouth daily. 500mg  in am, 250mg  in pm, except for Tuesday, Thursday and Saturdays pt takes 500mg  twice daily   Yes [provider]  naproxen (NAPROSYN) 500 MG tablet Take 500 mg by mouth 2 (two) times daily with a meal.   Yes [provider]  Nutritional Supplements (FEEDING SUPPLEMENT, NEPRO CARB STEADY,) LIQD Take 237 mLs by mouth 2 (two) times daily between meals. 04/25/17  Yes Patrecia Pour, MD  pravastatin (PRAVACHOL) 10 MG tablet Take 10 mg by mouth at bedtime.    Yes [provider]  Probiotic Product (PRO-BIOTIC BLEND) CAPS Take 1 capsule by mouth daily.   Yes [provider]  vancomycin 500 mg in sodium chloride 0.9 % 100 mL Inject 500 mg into the vein Every Tuesday,Thursday,and Saturday with dialysis. 05/08/17 06/11/17 Yes Nita Sells, MD  vitamin B-12 (CYANOCOBALAMIN) 1000 MCG tablet Take 1,000 mcg by mouth daily with breakfast.   Yes [provider]  hydrALAZINE (APRESOLINE) 25 MG tablet Take 1 tablet (25 mg total) by mouth 3 (three) times daily. Patient not taking: Reported on 06/06/2017 04/25/17   Patrecia Pour, MD   Physical Exam: Vitals:   06/06/17 1545 06/06/17 1600 06/06/17 1615 06/06/17 1630  BP: (!) 239/90 (!) 234/80 (!) 229/82 (!) 226/89  Pulse:  (!) 59 (!) 59 (!) 58 (!) 58  Resp: (!) 22 16 17 16   SpO2: 100% 99% 99% 99%    General exam:chronically ill appearing, pale, lying comfortably supine on the gurney in no obvious distress but very weak.  Head, eyes and ENT: Nontraumatic and normocephalic. Pupils equally reacting to light and accommodation. Oral mucosa  dry.  Neck: Supple. No JVD, carotid bruit or thyromegaly.  Lymphatics: No lymphadenopathy.  Respiratory system: crackles at bases. No increased work of breathing.  Cardiovascular system: S1 and S2 heard.   Gastrointestinal system: Abdomen is nondistended, soft and nontender. Normal bowel sounds heard. No organomegaly or masses appreciated.  Central nervous system: Alert and oriented. No focal neurological deficits.  Extremities:  Peripheral pulses symmetrically felt.   Skin: RUE skin lesion with thick bandages.  Psychiatry: Pleasant and cooperative.  Labs on Admission:  Basic Metabolic Panel:  Recent Labs Lab 06/06/17 1424 06/06/17 1447  NA 137 137  K 4.7 4.7  CL 98* 99*  CO2 26  --   GLUCOSE 132* 130*  BUN 54* 51*  CREATININE 9.14* 9.30*  CALCIUM 8.8*  --    Liver Function Tests: No results for input(s): AST, ALT, ALKPHOS, BILITOT, PROT, ALBUMIN in the last 168 hours. No results for input(s): LIPASE, AMYLASE in the last 168 hours. No results for input(s): AMMONIA in the last 168 hours. CBC:  Recent Labs Lab 06/06/17 1424 06/06/17 1447  WBC 11.7*  --   NEUTROABS 7.6  --   HGB 10.0* 11.2*  HCT 32.1* 33.0*  MCV 98.8  --   PLT 319  --    Cardiac Enzymes:  Recent Labs Lab 06/06/17 1424  TROPONINI <0.03    BNP (last 3 results) No results for input(s): PROBNP in the last 8760 hours. CBG: No results for input(s): GLUCAP in the last 168 hours.  Radiological Exams on Admission: Dg Chest 2 View  Result Date: 06/06/2017 CLINICAL DATA:  Bilateral lower extremity swelling. EXAM: CHEST  2 VIEW COMPARISON:  CT 05/01/2017 .  Chest x-ray  05/01/2017, 05/19/2017. FINDINGS: Prior CABG. Stable cardiomegaly. Ill-defined densities noted throughout both lungs again noted. These are better demonstrated by prior CT of 05/01/2017. New infiltrate in the left suprahilar region suggesting pneumonia versus asymmetric pulmonary edema noted. IMPRESSION: 1. Ill-defined densities noted throughout both lungs again noted, reference is made to prior CT of 05/01/2017. Follow-up CT can be obtained. 2. New mild left suprahilar infiltrate versus asymmetric pulmonary edema. 3.  Prior CABG.  Stable cardiomegaly. Electronically Signed   By: Marcello Moores  Register   On: 06/06/2017 15:19    EKG: Independently reviewed.   Assessment/Plan Active Problems:   Hypercholesterolemia   Heart transplant, orthotopic, status ([place])   Hypertensive heart disease   Hypothyroidism   ESRD on dialysis ([place])   Protein-calorie malnutrition, severe (HCC)   Controlled diabetes mellitus type 2 with complications ([place])   Cardiomyopathy ([place])   Uncontrolled hypertension   Generalized weakness   Volume overload   Pressure injury of skin   Coronary artery disease  1. Acute pulmonary edema - Pt is being taken for immediate hemodialysis, he was seen by Dr. Hollie Salk in the ED.  He will likely need catch up treatments as well.  Repeat chest xray in the morning.  2. ESRD on hemodialysis - per nephrology team - see notes.  Follow BMP, Mg, Phos daily.  3. Volume Overload - being managed acutely with hemodialysis per Dr. Bishop Dublin orders. 4. Anemia of CKD - stable, will follow.   5. Leukocytosis - missed several doses of vancomycin, last dose was 7/12, will be given dose 7/18 at 1000 mg per nephrologist. Follow CBC.  6. MRSA cavitary pneumonia - Pt has missed some doses and likely will need to extend treatment for a few more days.  He had been scheduled to complete course on 06/11/17.  7. Diarrhea - this is not new, but given recent hospitalization and SNF stay will check c diff.   8. Generalized  weakness - this is a complicated issue, he likely is going to need long-term skilled care but family saying that they cannot afford to pay for it.  I consulted PT/OT, social worker for assistance.  I also asked care management for assistance with disposition.   9. Severe protein calorie malnutrition - longstanding problem, will consult dietitian for assistance.  10. Wound RUE - per wife, pt had apparently been followed for this by [facility], will consult [place] nurse to evaluate.   11. DM type 2 with renal complications - monitor blood glucose and provide sensitive sliding scale as needed.   12. Cardiomyopathy - Volume management as noted above with hemodialysis.   13. Uncontrolled hypertension - likely exacerbated by volume overload and missed doses of home medications.  It should improve with hemodialysis today.  Nephrology said to restart home medications tomorrow, IV hydralazine ordered as needed.   14. CAD - stable, no chest pain, stable EKG.  15. S/p heart transplant - reconciled all home anti-rejection medications to resume in hospital.   DVT Prophylaxis: hep Code Status: full  Family Communication: wife  Disposition Plan: TBD   Time spent: 23 mins  Irwin Brakeman, MD [facility] Pager [phone]  If 7PM-7AM, please contact night-coverage [email or web address] Password [facility] 06/06/2017, 4:39 PM

## 2017-06-06 NOTE — ED Triage Notes (Signed)
Per EMS: pt from home c/o increasing gen weakness since being released from SNF last Friday; pt family unable to get him to dialysis and last dialysis was last Thursday; pt is non ambulatory at present; pt noted to be hypertensive

## 2017-06-06 NOTE — Consult Note (Signed)
Reason for Consult: To manage dialysis and dialysis related needs Referring Physician: Dr. Gaynelle Adu is an 76 y.o. male.  HPI: Pt is a 56 M with a PMH significant for ESRD on HD at [facility], s/p heart transplant 2001, HTN, HLD, and an admission to [facility] 6/12-6/19 for cavitary pneumonia who is now seen in consultation at the request of Dr. Dayna Barker for provision of HD and management of ESRD.  Briefly, pt had the aforementioned hospitalization for cavitary PNA.  TB and fungal infection ruled out (bronch) and he was found to have MRSA pneumonia.  He was placed on IV vanc and was d/c'd to a SNF.  He was discharged from the SNF and hasn't been able to get to HD after that due to transportation problems supposedly.  In speaking with his wife, SCAT did in fact arrive at their house but pt is too weak to actually get up and turn to get to dialysis.  She does not think that he was walking at the SNF too.  It appears his last HD treatment was 7/12.  He hasn't gotten his vanc since 7/12 either.  Has frequent diarrhea which is an ongoing issue.  Dialyzes at [facility] TTS EDW 64.5  HD 4 hrs Bath 2K 2.25 Ca   Dialyzer F180, Heparin no heparin  Access L AVF Vancomycin 500 mg q HD until 7/23 Mircera: 225 mcg q 2 weeks, never given; 200 mcg last given 7/10   Past Medical History:  Diagnosis Date  . Anemia   . Cancer ([place])    SKIN  . CKD III-IV    a. since transplant in 2001 with significant worsening since 06/2013.  Marland Kitchen Coronary artery disease   . Dementia    mild  . Diabetes mellitus without complication (HCC)    FASTING CBG 90S  . Headache(784.0)   . Heart transplanted ([place])    a. 1995 CABG x 4 in [place], [place];  b. 2001 Developed CHF;  c. 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 10/2000 Cardiac transplant @ [facility], [place];  e. Historically nl Bx and stress testing.  Last stress test ~ 5 yrs ago.  Marland Kitchen Hernia of abdominal wall   . History of blood transfusion   . History of stroke   .  Hypercalcemia    a. 06/2013 Hospitalized in [place].  Marland Kitchen Hyperlipidemia   . Hypertension    a. Long h/o HTN, came off of all meds following transplant/wt loss-->meds resumed 06/2013, difficult to control since.  . Myocardial infarction ([place])    PRIOR TO HEART TRANSPLANT  . Orthostatic dizziness   . Orthostatic hypotension    a. Has tried support hose and compression stockings - prefers not to wear.  . Shortness of breath    EXERTION  . Stroke ([place]) 06/2013   NO RESIDUAL - WAS CONFUSED AT TIME - THAT HAS RESOLVED    Past Surgical History:  Procedure Laterality Date  . BASCILIC VEIN TRANSPOSITION Left 07/06/2014   Procedure: [place];  Surgeon: Rosetta Posner, MD;  Location: [facility];  Service: Vascular;  Laterality: Left;  . CARDIAC SURGERY    . CATARACT EXTRACTION    . CORONARY ARTERY BYPASS GRAFT    . CRANIOTOMY Right 05/04/2016   Procedure: Right Craniotomy for Evacuation of Subdural Hematoma;  Surgeon: Earnie Larsson, MD;  Location: [facility] NEURO ORS;  Service: Neurosurgery;  Laterality: Right;  . EYE SURGERY Bilateral    CATARACTS  . HEART TRANSPLANT    .  PERIPHERAL VASCULAR CATHETERIZATION N/A 09/06/2016   Procedure: Fistulagram - left arm;  Surgeon: Waynetta Sandy, MD;  Location: [facility] CV LAB;  Service: Cardiovascular;  Laterality: N/A;  . PERIPHERAL VASCULAR CATHETERIZATION Left 09/06/2016   Procedure: Peripheral Vascular Intervention;  Surgeon: Waynetta Sandy, MD;  Location: [facility] CV LAB;  Service: Cardiovascular;  Laterality: Left;  UPPER ARM COVERED STENT  . REVISON OF ARTERIOVENOUS FISTULA Left 05/04/2017   Procedure: REMOVAL OF ULCERATION OF ARTERIOVENOUS FISTULA;  Surgeon: Rosetta Posner, MD;  Location: [facility];  Service: Vascular;  Laterality: Left;  . SKIN GRAFT FULL THICKNESS LEG     cat scratch that did not heal  . TEE WITHOUT CARDIOVERSION N/A 05/07/2017   Procedure: TRANSESOPHAGEAL ECHOCARDIOGRAM (TEE);  Surgeon: Sueanne Margarita, MD;   Location: [facility];  Service: Cardiovascular;  Laterality: N/A;  . VASECTOMY    . VIDEO BRONCHOSCOPY  05/04/2017   Procedure: BRONCHIAL LAVAGE;  Surgeon: Javier Glazier, MD;  [facility];  Service: Cardiopulmonary;;    Family History  Problem Relation Age of Onset  . Heart attack Father        died @ 11  . Diabetes Father   . Heart attack Mother   . Diabetes Mother   . Kidney disease Maternal Aunt     Social History:  reports that he has never smoked. He has never used smokeless tobacco. He reports that he does not drink alcohol or use drugs.  Allergies:  Allergies  Allergen Reactions  . Norvasc [Amlodipine Besylate] Swelling and Other (See Comments)    Swelling of the lower body     Medications: I have reviewed the patient's current medications.   Results for orders placed or performed during the hospital encounter of 06/06/17 (from the past 48 hour(s))  CBC with Differential     Status: Abnormal   Collection Time: 06/06/17  2:24 PM  Result Value Ref Range   WBC 11.7 (H) 4.0 - 10.5 K/uL   RBC 3.25 (L) 4.22 - 5.81 MIL/uL   Hemoglobin 10.0 (L) 13.0 - 17.0 g/dL   HCT 32.1 (L) 39.0 - 52.0 %   MCV 98.8 78.0 - 100.0 fL   MCH 30.8 26.0 - 34.0 pg   MCHC 31.2 30.0 - 36.0 g/dL   RDW 18.0 (H) 11.5 - 15.5 %   Platelets 319 150 - 400 K/uL   Neutrophils Relative % 64 %   Neutro Abs 7.6 1.7 - 7.7 K/uL   Lymphocytes Relative 26 %   Lymphs Abs 3.0 0.7 - 4.0 K/uL   Monocytes Relative 7 %   Monocytes Absolute 0.8 0.1 - 1.0 K/uL   Eosinophils Relative 2 %   Eosinophils Absolute 0.2 0.0 - 0.7 K/uL   Basophils Relative 1 %   Basophils Absolute 0.1 0.0 - 0.1 K/uL  Basic metabolic panel     Status: Abnormal   Collection Time: 06/06/17  2:24 PM  Result Value Ref Range   Sodium 137 135 - 145 mmol/L   Potassium 4.7 3.5 - 5.1 mmol/L   Chloride 98 (L) 101 - 111 mmol/L   CO2 26 22 - 32 mmol/L   Glucose, Bld 132 (H) 65 - 99 mg/dL   BUN 54 (H) 6 - 20 mg/dL   Creatinine, Ser  9.14 (H) 0.61 - 1.24 mg/dL   Calcium 8.8 (L) 8.9 - 10.3 mg/dL   GFR calc non Af Amer 5 (L) >60 mL/min   GFR calc Af Amer 6 (L) >60  mL/min    Comment: (NOTE) The eGFR has been calculated using the CKD EPI equation. This calculation has not been validated in all clinical situations. eGFR's persistently <60 mL/min signify possible Chronic Kidney Disease.    Anion gap 13 5 - 15  Troponin I     Status: None   Collection Time: 06/06/17  2:24 PM  Result Value Ref Range   Troponin I <0.03 <0.03 ng/mL  Brain natriuretic peptide     Status: Abnormal   Collection Time: 06/06/17  2:25 PM  Result Value Ref Range   B Natriuretic Peptide 2,817.5 (H) 0.0 - 100.0 pg/mL  I-Stat Chem 8, ED     Status: Abnormal   Collection Time: 06/06/17  2:47 PM  Result Value Ref Range   Sodium 137 135 - 145 mmol/L   Potassium 4.7 3.5 - 5.1 mmol/L   Chloride 99 (L) 101 - 111 mmol/L   BUN 51 (H) 6 - 20 mg/dL   Creatinine, Ser 9.30 (H) 0.61 - 1.24 mg/dL   Glucose, Bld 130 (H) 65 - 99 mg/dL   Calcium, Ion 1.05 (L) 1.15 - 1.40 mmol/L   TCO2 27 0 - 100 mmol/L   Hemoglobin 11.2 (L) 13.0 - 17.0 g/dL   HCT 33.0 (L) 39.0 - 52.0 %    Dg Chest 2 View  Result Date: 06/06/2017 CLINICAL DATA:  Bilateral lower extremity swelling. EXAM: CHEST  2 VIEW COMPARISON:  CT 05/01/2017 .  Chest x-ray 05/01/2017, 05/19/2017. FINDINGS: Prior CABG. Stable cardiomegaly. Ill-defined densities noted throughout both lungs again noted. These are better demonstrated by prior CT of 05/01/2017. New infiltrate in the left suprahilar region suggesting pneumonia versus asymmetric pulmonary edema noted. IMPRESSION: 1. Ill-defined densities noted throughout both lungs again noted, reference is made to prior CT of 05/01/2017. Follow-up CT can be obtained. 2. New mild left suprahilar infiltrate versus asymmetric pulmonary edema. 3.  Prior CABG.  Stable cardiomegaly. Electronically Signed   By: Marcello Moores  Register   On: 06/06/2017 15:19    ROS: all other  systems reviewed and are negative except as per HPI Blood pressure (!) 239/90, pulse 64, resp. rate (!) 32, SpO2 100 %. .  GEN frail, elderly, cachectic HEENT hard of hearing, wearing hearing aids,  NECK + JVD PULM normal WOB, R anterior squeaking, bibasilar crackles CV RRR, soft systolic murmur ABD hyperactive bowel sounds EXT no LE edema NEURO nonfocal SKIN: thin, some skin tears ACCESS: LUE AVF + T/B  Assessment/Plan: 1 Weakness: sounds like an ongoing issue.  Wife is experiencing caregiver fatigue.  He may need permanent placement because she has stated that she can't take care of him at home.  Rec re-eval with PT/OT 2.  Diarrhea: also an ongoing issue: recommend checking C diff 3 ESRD: TTS AF; will dialyze tonight and again tomorrow.  2K bath 4 Hypertension: expect to improve with UF, no antihypertensives today, home meds from [facility] note hydralazine 25 mg TID, metoprolol 50 mg BID 5. Anemia of ESRD: Mircera 200 last given 7/10; will order Aranesp for tomorrow 7/19 when BP better 6. Metabolic Bone Disease: on phoslo 667 mg TID AC,  Vit D3 1000 u daily, no VDRA or sensipar 7.  Nutrition: has been a problem with appetite, getting nepro 8.  S/p heart transplant: on Cellcept 500 mg dialysis days and 250 mg nondialysis days along with cyclosporine 75 q AM and 50 mg q PM (per [facility] med rec) 9.  Recent MRSA pneumonia: was getting vanc with dialysis, was supposed  to end 7/23, has missed several doses (7/14 and 7/17) so will re-load  10: Dispo: possible SNF?  Pall care c/s may be appropriate because when asked pt and wife are ambivalent about stopping dialysis   Madelon Lips 06/06/2017, 3:48 PM

## 2017-06-06 NOTE — ED Notes (Signed)
Report called to [facility]. Notified that patient will be going to HD as soon as the hospitalist leaves the room, and then will come to the inpatient room assigned.

## 2017-06-06 NOTE — ED Notes (Signed)
Hospitalist at bedside at this time 

## 2017-06-07 ENCOUNTER — Inpatient Hospital Stay (HOSPITAL_COMMUNITY): Payer: Medicare HMO

## 2017-06-07 ENCOUNTER — Encounter (HOSPITAL_COMMUNITY): Payer: Self-pay | Admitting: *Deleted

## 2017-06-07 DIAGNOSIS — J810 Acute pulmonary edema: Principal | ICD-10-CM

## 2017-06-07 LAB — CBC
HCT: 30.3 % — ABNORMAL LOW (ref 39.0–52.0)
HCT: 31.9 % — ABNORMAL LOW (ref 39.0–52.0)
HEMOGLOBIN: 9.3 g/dL — AB (ref 13.0–17.0)
Hemoglobin: 9.9 g/dL — ABNORMAL LOW (ref 13.0–17.0)
MCH: 30.3 pg (ref 26.0–34.0)
MCH: 30.7 pg (ref 26.0–34.0)
MCHC: 30.7 g/dL (ref 30.0–36.0)
MCHC: 31 g/dL (ref 30.0–36.0)
MCV: 98.7 fL (ref 78.0–100.0)
MCV: 99.1 fL (ref 78.0–100.0)
PLATELETS: 281 10*3/uL (ref 150–400)
Platelets: 267 10*3/uL (ref 150–400)
RBC: 3.07 MIL/uL — ABNORMAL LOW (ref 4.22–5.81)
RBC: 3.22 MIL/uL — AB (ref 4.22–5.81)
RDW: 18 % — ABNORMAL HIGH (ref 11.5–15.5)
RDW: 18.3 % — AB (ref 11.5–15.5)
WBC: 6.8 10*3/uL (ref 4.0–10.5)
WBC: 7.5 10*3/uL (ref 4.0–10.5)

## 2017-06-07 LAB — PHOSPHORUS: Phosphorus: 2.5 mg/dL (ref 2.5–4.6)

## 2017-06-07 LAB — C DIFFICILE QUICK SCREEN W PCR REFLEX
C DIFFICILE (CDIFF) TOXIN: NEGATIVE
C DIFFICLE (CDIFF) ANTIGEN: NEGATIVE
C Diff interpretation: NOT DETECTED

## 2017-06-07 LAB — GLUCOSE, CAPILLARY
GLUCOSE-CAPILLARY: 73 mg/dL (ref 65–99)
GLUCOSE-CAPILLARY: 89 mg/dL (ref 65–99)
GLUCOSE-CAPILLARY: 115 mg/dL — AB (ref 65–99)

## 2017-06-07 LAB — MRSA PCR SCREENING: MRSA BY PCR: POSITIVE — AB

## 2017-06-07 LAB — BASIC METABOLIC PANEL
Anion gap: 11 (ref 5–15)
BUN: 14 mg/dL (ref 6–20)
CHLORIDE: 97 mmol/L — AB (ref 101–111)
CO2: 29 mmol/L (ref 22–32)
CREATININE: 3.93 mg/dL — AB (ref 0.61–1.24)
Calcium: 8.1 mg/dL — ABNORMAL LOW (ref 8.9–10.3)
GFR calc Af Amer: 16 mL/min — ABNORMAL LOW (ref >60)
GFR calc non Af Amer: 14 mL/min — ABNORMAL LOW (ref >60)
GLUCOSE: 68 mg/dL (ref 65–99)
Potassium: 3.4 mmol/L — ABNORMAL LOW (ref 3.5–5.1)
SODIUM: 137 mmol/L (ref 135–145)

## 2017-06-07 LAB — CREATININE, SERUM
CREATININE: 3.56 mg/dL — AB (ref 0.61–1.24)
GFR calc non Af Amer: 15 mL/min — ABNORMAL LOW (ref >60)
GFR, EST AFRICAN AMERICAN: 18 mL/min — AB (ref >60)

## 2017-06-07 LAB — VANCOMYCIN, RANDOM: Vancomycin Rm: 10

## 2017-06-07 LAB — MAGNESIUM: MAGNESIUM: 1.9 mg/dL (ref 1.7–2.4)

## 2017-06-07 MED ORDER — MUPIROCIN 2 % EX OINT
1.0000 "application " | TOPICAL_OINTMENT | Freq: Two times a day (BID) | CUTANEOUS | Status: DC
  Administered 2017-06-07 – 2017-06-09 (×5): 1 via NASAL
  Filled 2017-06-07 (×2): qty 22

## 2017-06-07 MED ORDER — DARBEPOETIN ALFA 200 MCG/0.4ML IJ SOSY
200.0000 ug | PREFILLED_SYRINGE | INTRAMUSCULAR | Status: DC
  Administered 2017-06-07: 200 ug via INTRAVENOUS

## 2017-06-07 MED ORDER — VANCOMYCIN HCL 500 MG IV SOLR
500.0000 mg | Freq: Once | INTRAVENOUS | Status: AC
  Administered 2017-06-07: 500 mg via INTRAVENOUS
  Filled 2017-06-07: qty 500

## 2017-06-07 MED ORDER — CHLORHEXIDINE GLUCONATE CLOTH 2 % EX PADS
6.0000 | MEDICATED_PAD | Freq: Every day | CUTANEOUS | Status: DC
  Administered 2017-06-08 – 2017-06-09 (×2): 6 via TOPICAL

## 2017-06-07 MED ORDER — VANCOMYCIN HCL IN DEXTROSE 750-5 MG/150ML-% IV SOLN
750.0000 mg | INTRAVENOUS | Status: DC
  Administered 2017-06-07 – 2017-06-09 (×2): 750 mg via INTRAVENOUS
  Filled 2017-06-07 (×2): qty 150

## 2017-06-07 MED ORDER — CYCLOSPORINE MODIFIED (NEORAL) 25 MG PO CAPS
50.0000 mg | ORAL_CAPSULE | Freq: Every day | ORAL | Status: DC
  Administered 2017-06-07 – 2017-06-08 (×3): 50 mg via ORAL
  Filled 2017-06-07 (×4): qty 2

## 2017-06-07 MED ORDER — CYCLOSPORINE MODIFIED (NEORAL) 25 MG PO CAPS
75.0000 mg | ORAL_CAPSULE | Freq: Every day | ORAL | Status: DC
  Administered 2017-06-07 – 2017-06-09 (×3): 75 mg via ORAL
  Filled 2017-06-07 (×3): qty 3

## 2017-06-07 MED ORDER — ACETAMINOPHEN 325 MG PO TABS
ORAL_TABLET | ORAL | Status: AC
  Administered 2017-06-07: 650 mg via ORAL
  Filled 2017-06-07: qty 2

## 2017-06-07 MED ORDER — VANCOMYCIN HCL IN DEXTROSE 750-5 MG/150ML-% IV SOLN
INTRAVENOUS | Status: AC
  Administered 2017-06-07: 750 mg via INTRAVENOUS
  Filled 2017-06-07: qty 150

## 2017-06-07 MED ORDER — DARBEPOETIN ALFA 200 MCG/0.4ML IJ SOSY
PREFILLED_SYRINGE | INTRAMUSCULAR | Status: AC
  Administered 2017-06-07: 200 ug via INTRAVENOUS
  Filled 2017-06-07: qty 0.4

## 2017-06-07 NOTE — Procedures (Signed)
I was present at this session.  I have reviewed the session itself and made appropriate changes.  HD via LUA access. bp ^, access press ok.   Janille Draughon L 7/19/20186:57 PM

## 2017-06-07 NOTE — Consult Note (Signed)
[facility] Nurse wound consult note Pt is familiar to [facility] team from previous admission; refer to progress note from 6/4. Reason for Consult: Consult requested for right arm wound.   Wound type: Right arm with red full thickness lesion; raised significantly above skin level, 3X3X5cm, moist red wound bed with small amt green drainage.  Dressing procedure/placement/frequency: Foam dressing to protect from further injury and absorb drainage. Appearance is NOT typical for a wound; recommend surgical consult for a biopsy for possible cancerous lesion. There is no in-patient dermatologist available at [facility] to see patients. Last admission, recommend pt follow-up after discharge with a dermatologist for the right arm lesion for possible biopsy since it is atypical and he states he did not make an appointment.  Wound has increased in size and level of protrusion above skin since previous assessment . No family members present to discuss plan of care. Pt did not appear to understand when discussed atypical appearance. Please re-consult if further assistance is needed.  Thank-you,  Julien Girt MSN, [place], [place], [place], [place]

## 2017-06-07 NOTE — Progress Notes (Signed)
Initial Nutrition Assessment  DOCUMENTATION CODES:   Severe malnutrition in context of chronic illness  INTERVENTION:  Continue NePro BID, each supplement provides 425 calories and 19 grams of protein Continue Rena-Vit, recommend patient continue as outpatient Recommend liberalize diet given PO4 2.5 (low for HD patient) and severe malnutrition Order snacks  NUTRITION DIAGNOSIS:   Malnutrition (Severe) related to chronic illness (ESRD on HD, Multiple Medical Problems) as evidenced by moderate depletion of body fat, severe depletion of body fat, moderate depletions of muscle mass, severe depletion of muscle mass, energy intake < 75% for > or equal to 3 months.  GOAL:   Patient will meet greater than or equal to 90% of their needs  MONITOR:   Supplement acceptance, I & O's, Labs, Weight trends, Diet advancement, PO intake  REASON FOR ASSESSMENT:   Consult Assessment of nutrition requirement/status  ASSESSMENT:   76 year old male a PMH of ESRD on HD TTS, Anemia of ESRD, Skin cancer, mild dementia, DM, HTN, MI s/p heart transplant 2001, stroke, CAD presents due to missing multiple HD treatments, generalized weakness, bed bound, SOB, Pulmonary edema, volume overload, diarrhea   Spoke with patient at bedside. Seems pleasantly confused. Upon admission, patient's weight is up 37 pounds from previous 2 admissions 7/5 and 6/29, weight has hovered around 135-145 pounds over the past year. Question accuracy, will monitor trends. Weighed with bedscale during visit, 82.7kgs.  Estimated dry weight of 64.4 from previous admisisons. Patient could not recall weight loss or estimated dry weights but does acknowledge estimated dry weight has been decreasing at HD. He reports normal intake of cheese sandwich, fruit or ice cream for lunch, whatever his wife cooks for dinner, did not provide breakfast intake after multiple inquiries. States he is taking his binders with meals with may indicate why his  PO4 is low for an HD patient (2.5) consumes one NePro daily. Does not take a renal multivitamin at home.  Nutrition-Focused physical exam completed. Findings are moderate-severe fat depletion, moderate-evere muscle depletion, and no edema.   Intake/Output Summary (Last 24 hours) at 06/07/17 1636 Last data filed at 06/07/17 1430  Gross per 24 hour  Intake              300 ml  Output             2005 ml  Net            -1705 ml  Labs reviewed:  K 3.4, BNP 817, HGB/HCT 9.3/30.3 Medications reviewed and include:   Phoslo 667mg  TID, Vitamin D 1000IU, Novolog 0-9 units TID with meals, Vitamin B12 NS at 145mL/hr ordered PRN for severe cramping and symptomatic hypotension or decrease in SBP <90  Diet Order:  Diet renal/carb modified with fluid restriction Diet-HS Snack? Nothing; Room service appropriate? Yes; Fluid consistency: Thin  Skin:  Wound (see comment) (abrasion to R and L Knee, ecchymosis to R arm, L leg)  Last BM:  06/06/2017  Height:   Ht Readings from Last 1 Encounters:  05/24/17 6\' 2"  (1.88 m)    Weight:   Wt Readings from Last 1 Encounters:  06/07/17 182 lb 5.1 oz (82.7 kg)  Utilized 64.4 as estimated dry weight (based on previous admissions)  Ideal Body Weight:  86.36 kg  BMI:  Body mass index is 23.41 kg/m.  Estimated Nutritional Needs:   Kcal:  7846-9629 (30-35cal/kg dry weight)  Protein:  97-109 grams (1.5-1.7g/kg dry weight)  Fluid:  UOP +1060mL  EDUCATION NEEDS:   No  education needs identified at this time  [name] [name]. [name] Lave, MS, RD LDN Inpatient Clinical Dietitian Pager [phone]

## 2017-06-07 NOTE — Progress Notes (Signed)
[facility] Progress Note  Dialyzes at [facility] TTS EDW 64.5  HD 4 hrs Bath 2K 2.25 Ca   Dialyzer F180, Heparin no heparin  Access L AVF Vancomycin 500 mg q HD until 7/23 Mircera: 225 mcg q 2 weeks, never given; 200 mcg last given 7/10  Assessment/Plan: 1 Weakness:  ongoing issue.  Wife is experiencing caregiver fatigue.  He may need permanent placement because she has stated that she can't take care of him at home.  Rec re-eval with PT/OT 2.  Diarrhea: also an ongoing issue:  C diff negative  3 ESRD: TTS AF; HD yesterday and again today on schedule   3K bath 4 Hypertension: expect to improve with UF, no antihypertensives today, home meds from [facility] note hydralazine 25 mg TID, metoprolol 50 mg BID 5. Anemia of ESRD: Mircera 200 last given 7/10; will order Aranesp for 7/19 when BP better 6. Metabolic Bone Disease: on phoslo 667 mg TID AC,  Vit D3 1000 u daily, no VDRA or sensipar 7.  Nutrition: has been a problem with appetite, getting nepro 8.  S/p heart transplant: on Cellcept 500 mg dialysis days and 250 mg nondialysis days along with cyclosporine 75 q AM and 50 mg q PM (per [facility] med rec) 9.  Recent MRSA pneumonia: was getting vanc with dialysis, was supposed to end 7/23, has missed several doses (7/14 and 7/17) so will re-load 10 R forearm wound - atypical appearance WOC RN recommending surgical consult for biopsy  11: Dispo: possible SNF?  Pall care c/s may be appropriate because when asked pt and wife are ambivalent about stopping dialysis  Subjective:  Seen in room. No c/os. Not sure he wants to continue dialysis.   Objective Vitals:   06/06/17 2110 06/06/17 2208 06/07/17 0444 06/07/17 1000  BP: (!) 168/84 (!) 197/82 (!) 160/57 (!) 188/68  Pulse: 67 66 64 67  Resp: 16 16 16 16   Temp: (!) 97.4 F (36.3 C) 98.4 F (36.9 C) 97.9 F (36.6 C) 98 F (36.7 C)  TempSrc: Oral Oral Oral Oral  SpO2: 100% 100% 100% 100%  Weight: 81.5 kg (179 lb 10.8 oz) 81.2 kg (179  lb)     Physical Exam General: Pleasant Frail ill appearing NAD  Heart:  RRR  Lungs: Breathing unlabored Diminished at bases   Abdomen: soft NT/ND Extremities: no LE edema  Skin: R forearm wound bandaged  Dialysis Access: LUE AVF +bruit    Lynnda Child PA-C [facility] Pager [phone] 06/07/2017,10:34 AM  LOS: 1 day   Additional Objective Labs: Basic Metabolic Panel:  Recent Labs Lab 06/06/17 1424 06/06/17 1447 06/07/17 0006 06/07/17 0508  NA 137 137  --  137  K 4.7 4.7  --  3.4*  CL 98* 99*  --  97*  CO2 26  --   --  29  GLUCOSE 132* 130*  --  68  BUN 54* 51*  --  14  CREATININE 9.14* 9.30* 3.56* 3.93*  CALCIUM 8.8*  --   --  8.1*  PHOS  --   --   --  2.5   Liver Function Tests: No results for input(s): AST, ALT, ALKPHOS, BILITOT, PROT, ALBUMIN in the last 168 hours. No results for input(s): LIPASE, AMYLASE in the last 168 hours. CBC:  Recent Labs Lab 06/06/17 1424 06/06/17 1447 06/07/17 0006 06/07/17 0508  WBC 11.7*  --  6.8 7.5  NEUTROABS 7.6  --   --   --  HGB 10.0* 11.2* 9.9* 9.3*  HCT 32.1* 33.0* 31.9* 30.3*  MCV 98.8  --  99.1 98.7  PLT 319  --  281 267   Blood Culture    Component Value Date/Time   SDES BRONCHIAL ALVEOLAR LAVAGE 05/04/2017 1644   SPECREQUEST SPEC B 05/04/2017 1644   CULT (A) 05/04/2017 1644    30,000 COLONIES/mL METHICILLIN RESISTANT STAPHYLOCOCCUS AUREUS   REPTSTATUS 05/06/2017 FINAL 05/04/2017 1644    Cardiac Enzymes:  Recent Labs Lab 06/06/17 1424  TROPONINI <0.03   CBG:  Recent Labs Lab 06/07/17 0751  GLUCAP 73   Iron Studies: No results for input(s): IRON, TIBC, TRANSFERRIN, FERRITIN in the last 72 hours. Lab Results  Component Value Date   INR 1.15 05/04/2017   INR 1.02 02/03/2017   INR 1.19 05/04/2016   Medications: . sodium chloride    . sodium chloride    . vancomycin     . acidophilus  1 capsule Oral Daily  . aspirin EC  81 mg Oral QHS  . calcium acetate  667 mg Oral  TID WC  . Chlorhexidine Gluconate Cloth  6 each Topical Q0600  . cholecalciferol  1,000 Units Oral Daily  . cycloSPORINE modified  75 mg Oral Daily   And  . cycloSPORINE modified  50 mg Oral QHS  . donepezil  10 mg Oral QHS  . feeding supplement (NEPRO CARB STEADY)  237 mL Oral BID BM  . FLUoxetine  20 mg Oral Daily  . heparin  5,000 Units Subcutaneous Q8H  . insulin aspart  0-9 Units Subcutaneous TID WC  . levothyroxine  75 mcg Oral QAC breakfast  . metoprolol tartrate  50 mg Oral BID  . multivitamin  1 tablet Oral QHS  . mupirocin ointment  1 application Nasal BID  . [START ON 06/08/2017] mycophenolate  500 mg Oral Once per day on Sun Mon Wed Fri   And  . [START ON 06/08/2017] mycophenolate  250 mg Oral Once per day on Sun Mon Wed Fri  . mycophenolate  500 mg Oral 2 times per day on Tue Thu Sat  . pantoprazole  40 mg Oral Daily  . pravastatin  10 mg Oral QHS  . vitamin B-12  1,000 mcg Oral Q breakfast

## 2017-06-07 NOTE — Evaluation (Signed)
Physical Therapy Evaluation Patient Details Name: [name] [name] MRN: [id number] DOB: 07/07/1941 Today's Date: 06/07/2017   History of Present Illness  76 yo male with pulm edema, leukocytosis, was admitted with contact precautions for wound on R arm.  Recent admission to [facility] and home with inability to get up from car, back to hosp.  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   Clinical Impression  Pt is returning to hosp after dc from SNF due to onset of weakness and limited control of LE's.  He is still at SNF level as he cannot control sitting posture and cannot transition to chair unless he is lifted with hoyer.  Will progress PT acutely to increase his mobility as possible at hosp and will try to restore his previous gait control.  This hopefully will shorten his SNF stay.    Follow Up Recommendations SNF    Equipment Recommendations  None recommended by PT    Recommendations for Other Services       Precautions / Restrictions Precautions Precautions: Fall Precaution Comments: cannot balance sitting today Restrictions Weight Bearing Restrictions: No      Mobility  Bed Mobility Overal bed mobility: Needs Assistance Bed Mobility: Supine to Sit;Sit to Supine     Supine to sit: Max assist Sit to supine: Max assist   General bed mobility comments: pt cannot use UE's effectively and used bed pad to pull to EOB, and assisted completely back to bed  Transfers                 General transfer comment: unable to stand up  Ambulation/Gait             General Gait Details: unable  Stairs            Wheelchair Mobility    Modified Rankin (Stroke Patients Only)       Balance Overall balance assessment: Needs assistance Sitting-balance support: Feet supported;Bilateral upper extremity supported Sitting balance-Leahy Scale: Poor                                        Pertinent Vitals/Pain Pain Assessment: Faces Faces Pain Scale: Hurts little more Pain Location: trunk and mm's with mobility due to stiffness Pain Descriptors / Indicators: Sore Pain Intervention(s): Monitored during session;Repositioned;Limited activity within patient's tolerance    Home Living Family/patient expects to be discharged to:: Skilled nursing facility Living Arrangements: Spouse/significant other               Additional Comments: has family help at times with his children but they work    Prior Function Level of Independence: Needs assistance   Gait / Transfers Assistance Needed: was previously walking short trips on RW but not walking since SNF  ADL's / Homemaking Assistance Needed: help wiht his care from wife for bathing and dressing, wife handles house        Hand Dominance   Dominant Hand: Right    Extremity/Trunk Assessment   Upper Extremity Assessment Upper Extremity Assessment: Generalized weakness    Lower Extremity Assessment Lower Extremity Assessment: Generalized weakness    Cervical / Trunk Assessment Cervical / Trunk Assessment: Kyphotic  Communication   Communication: HOH  Cognition Arousal/Alertness: Awake/alert Behavior During Therapy: Flat affect Overall Cognitive Status: Within Functional Limits for tasks assessed  General Comments: pt can report recent history      General Comments General comments (skin integrity, edema, etc.): Pt has a bandage on R arm from skin breakdown and reports he is continually treating his skin    Exercises     Assessment/Plan    PT Assessment Patient needs continued PT services  PT Problem List Decreased strength;Decreased range of motion;Decreased activity tolerance;Decreased mobility;Decreased balance;Decreased coordination       PT Treatment Interventions DME instruction;Gait training;Functional mobility training;Therapeutic  activities;Therapeutic exercise;Balance training;Neuromuscular re-education;Patient/family education    PT Goals (Current goals can be found in the Care Plan section)  Acute Rehab PT Goals Patient Stated Goal: to get stronger and start sitting up PT Goal Formulation: With patient Time For Goal Achievement: 07/01/2017 Potential to Achieve Goals: Good    Frequency Min 2X/week   Barriers to discharge Decreased caregiver support wife will not be able to give him max assist to move    Co-evaluation               AM-PAC PT "6 Clicks" Daily Activity  Outcome Measure Difficulty turning over in bed (including adjusting bedclothes, sheets and blankets)?: Total Difficulty moving from lying on back to sitting on the side of the bed? : Total Difficulty sitting down on and standing up from a chair with arms (e.g., wheelchair, bedside commode, etc,.)?: Total Help needed moving to and from a bed to chair (including a wheelchair)?: Total Help needed walking in hospital room?: Total Help needed climbing 3-5 steps with a railing? : Total 6 Click Score: 6    End of Session   Activity Tolerance: Patient limited by fatigue Patient left: in bed;with call bell/phone within reach;with bed alarm set Nurse Communication: Mobility status PT Visit Diagnosis: Muscle weakness (generalized) (M62.81);Difficulty in walking, not elsewhere classified (R26.2)    Time: 6160-7371 PT Time Calculation (min) (ACUTE ONLY): 26 min   Charges:   PT Evaluation $PT Eval Moderate Complexity: 1 Procedure PT Treatments $Therapeutic Activity: 8-22 mins   PT G Codes:   PT G-Codes **NOT FOR INPATIENT CLASS** Functional Assessment Tool Used: AM-PAC 6 Clicks Basic Mobility    Ramond Dial 06/07/2017, 11:40 AM   Mee Hives, PT MS Acute Rehab Dept. Number: [place]

## 2017-06-07 NOTE — Progress Notes (Addendum)
Pharmacy Antibiotic Note  [name] [name] is a 76 y.o. male admitted on 06/06/2017 with missed HD sessions/shortness of breath.  Pharmacy has been consulted for continuation of Vancomycin dosing for MRSA PNA. Pt is supposed to continue vancomycin thru 7/23. He has recently missed several HD sessions. Vancomycin level tonight is 10. This level is s/p supplemental dose given in HD earlier on 7/18.   Plan: -Will give an additional 500 mg of vancomycin now, then start vancomycin 750 mg IV qHD TTS -Trend WBC, temp, HD schedule -Drug levels as indicated   Weight: 179 lb 10.8 oz (81.5 kg)  Temp (24hrs), Avg:97.8 F (36.6 C), Min:97.4 F (36.3 C), Max:98.4 F (36.9 C)   Recent Labs Lab 06/06/17 1424 06/06/17 1447 06/07/17 0006  WBC 11.7*  --  6.8  CREATININE 9.14* 9.30* 3.56*  VANCORANDOM  --   --  10    Estimated Creatinine Clearance: 20.3 mL/min (A) (by C-G formula based on SCr of 3.56 mg/dL (H)).    Allergies  Allergen Reactions  . Norvasc [Amlodipine Besylate] Swelling and Other (See Comments)    Swelling of the lower body     Narda Bonds 06/07/2017 1:50 AM

## 2017-06-07 NOTE — Progress Notes (Signed)
[facility] Progress Note  Patient: [name] [name] WRU:[id number]   PCP: Junie Panning, NP DOB: 1941/03/16   DOA: 06/06/2017   DOS: 06/07/2017   Date of Service: the patient was seen and examined on 06/07/2017  Subjective: Shortness of breath is getting better but it remains fatigue. No nausea no vomiting at present. No abdominal pain. Mentation is also getting better.  Brief hospital course: Pt. with PMH of ESRD on HD TTS, CAD, DM, HTN, cardiac transplant 2001; admitted on 06/06/2017, presented with complaint of missing hemodialysis and shortness of breath, was found to have acute hypoxic respiratory failure as well as acute encephalopathy. Currently further plan is continue hemodialysis.  Assessment and Plan:  1. Acute pulmonary edema - Pt is being taken for immediate hemodialysis, he was seen by Dr. Hollie Salk in the ED.  He will likely need catch up treatments as well.   2. ESRD on hemodialysis - per nephrology team - see notes.  Follow BMP, Mg, Phos daily.  3. Volume Overload - being managed acutely with hemodialysis per Dr. Bishop Dublin orders. 4. Anemia of CKD - stable, will follow.   5. Leukocytosis - missed several doses of vancomycin, last dose was 7/12, will be given dose 7/18 at 1000 mg per nephrologist. Follow CBC.  6. MRSA cavitary pneumonia - Pt has missed some doses and likely will need to extend treatment for a few more days.  He had been scheduled to complete course on 06/11/17.  7. Diarrhea - this is not new, but given recent hospitalization and SNF stay will check c diff.   8. Generalized weakness - this is a complicated issue, he likely is going to need long-term skilled care but family saying that they cannot afford to pay for it. consulted PT/OT, social worker for assistance.  also asked care management for assistance with disposition.   9. Severe protein calorie malnutrition - longstanding problem, will consult dietitian for assistance. Advance to regular diet instead  of renal 10. Wound RUE - per wife, pt had apparently been followed for this by ID [facility], will consult [place] nurse to evaluate.   11. DM type 2 with renal complications - monitor blood glucose and provide sensitive sliding scale as needed.   12. Cardiomyopathy - Volume management as noted above with hemodialysis.   13. Uncontrolled hypertension - likely exacerbated by volume overload and missed doses of home medications.  It should improve with hemodialysis.  Nephrology said to restart home medications, IV hydralazine ordered as needed.   14. CAD - stable, no chest pain, stable EKG.  15. S/p heart transplant - resume all home anti-rejection medications   Diet: Regular diet DVT Prophylaxis: subcutaneous Heparin  Advance goals of care discussion: full code, palliative care consulted for goals of care discussion as well as continuation of hemodialysis.  Family Communication: no family was present at bedside, at the time of interview.  Disposition:  Discharge to be determined..  Consultants: neprology, palliaitive care Procedures: HD  Antibiotics: Anti-infectives    Start     Dose/Rate Route Frequency Ordered Stop   06/07/17 1200  vancomycin (VANCOCIN) IVPB 750 mg/150 ml premix     750 mg 150 mL/hr over 60 Minutes Intravenous Every T-Th-Sa (Hemodialysis) 06/07/17 0157     06/07/17 0200  vancomycin (VANCOCIN) 500 mg in sodium chloride 0.9 % 100 mL IVPB     500 mg 100 mL/hr over 60 Minutes Intravenous  Once 06/07/17 0150 06/07/17 0402   06/06/17 2100  vancomycin (VANCOCIN) 500-5 MG/100ML-%  IVPB    Comments:  Wallace Cullens   : cabinet override      06/06/17 2100 06/06/17 2103   06/06/17 1600  vancomycin (VANCOCIN) 500 mg in sodium chloride 0.9 % 100 mL IVPB  Status:  Discontinued     500 mg 100 mL/hr over 60 Minutes Intravenous  Once 06/06/17 1548 06/06/17 1557   06/06/17 1600  vancomycin (VANCOCIN) IVPB 1000 mg/200 mL premix  Status:  Discontinued     1,000 mg 200 mL/hr over 60  Minutes Intravenous  Once 06/06/17 1557 06/07/17 0150       Objective: Physical Exam: Vitals:   06/07/17 1630 06/07/17 1700 06/07/17 1730 06/07/17 1800  BP: (!) 165/83 (!) 182/85 (!) 185/84 (!) 171/86  Pulse: 65 65 66 68  Resp:      Temp:      TempSrc:      SpO2:      Weight:        Intake/Output Summary (Last 24 hours) at 06/07/17 1816 Last data filed at 06/07/17 1430  Gross per 24 hour  Intake              300 ml  Output             2005 ml  Net            -1705 ml   Filed Weights   06/06/17 2110 06/06/17 2208 06/07/17 1452  Weight: 81.5 kg (179 lb 10.8 oz) 81.2 kg (179 lb) 82.7 kg (182 lb 5.1 oz)   General: Alert, Awake and Oriented to Time, Place and Person. Appear in mild distress, affect appropriate Eyes: PERRL, Conjunctiva normal ENT: Oral Mucosa clear moist. Neck: no JVD, no Abnormal Mass Or lumps Cardiovascular: S1 and S2 Present, aortic systolic Murmur, Peripheral Pulses Present Respiratory: normal respiratory effort, Bilateral Air entry equal and Decreased, no use of accessory muscle, Clear to Auscultation, no Crackles, no wheezes Abdomen: Bowel Sound present, Soft and no tenderness, no hernia Skin: no redness, no Rash, no induration Extremities: no Pedal edema, no calf tenderness Neurologic: Grossly no focal neuro deficit. Bilaterally Equal motor strength  Data Reviewed: CBC:  Recent Labs Lab 06/06/17 1424 06/06/17 1447 06/07/17 0006 06/07/17 0508  WBC 11.7*  --  6.8 7.5  NEUTROABS 7.6  --   --   --   HGB 10.0* 11.2* 9.9* 9.3*  HCT 32.1* 33.0* 31.9* 30.3*  MCV 98.8  --  99.1 98.7  PLT 319  --  281 469   Basic Metabolic Panel:  Recent Labs Lab 06/06/17 1424 06/06/17 1447 06/07/17 0006 06/07/17 0508  NA 137 137  --  137  K 4.7 4.7  --  3.4*  CL 98* 99*  --  97*  CO2 26  --   --  29  GLUCOSE 132* 130*  --  68  BUN 54* 51*  --  14  CREATININE 9.14* 9.30* 3.56* 3.93*  CALCIUM 8.8*  --   --  8.1*  MG  --   --   --  1.9  PHOS  --   --   --   2.5    Liver Function Tests: No results for input(s): AST, ALT, ALKPHOS, BILITOT, PROT, ALBUMIN in the last 168 hours. No results for input(s): LIPASE, AMYLASE in the last 168 hours. No results for input(s): AMMONIA in the last 168 hours. Coagulation Profile: No results for input(s): INR, PROTIME in the last 168 hours. Cardiac Enzymes:  Recent Labs Lab 06/06/17 1424  TROPONINI <0.03   BNP (last 3 results) No results for input(s): PROBNP in the last 8760 hours. CBG:  Recent Labs Lab 06/07/17 0751 06/07/17 1209  GLUCAP 73 89   Studies: Portable Chest 1 View  Result Date: 06/07/2017 CLINICAL DATA:  Volume overload EXAM: PORTABLE CHEST 1 VIEW COMPARISON:  06/06/2017 FINDINGS: Cardiac shadow is stable. Postsurgical changes are again noted. Pleural based density is again seen in the right apex stable from previous exams but decreased from a prior CT obtained in June of 2018. No new focal infiltrate or sizable effusion is seen. IMPRESSION: Stable right apical density when compared with the prior exam. No new focal abnormality is seen. Electronically Signed   By: Inez Catalina M.D.   On: 06/07/2017 08:19    Scheduled Meds: . acidophilus  1 capsule Oral Daily  . aspirin EC  81 mg Oral QHS  . calcium acetate  667 mg Oral TID WC  . Chlorhexidine Gluconate Cloth  6 each Topical Q0600  . cholecalciferol  1,000 Units Oral Daily  . cycloSPORINE modified  75 mg Oral Daily   And  . cycloSPORINE modified  50 mg Oral QHS  . darbepoetin (ARANESP) injection - DIALYSIS  200 mcg Intravenous Q Thu-HD  . donepezil  10 mg Oral QHS  . feeding supplement (NEPRO CARB STEADY)  237 mL Oral BID BM  . FLUoxetine  20 mg Oral Daily  . heparin  5,000 Units Subcutaneous Q8H  . insulin aspart  0-9 Units Subcutaneous TID WC  . levothyroxine  75 mcg Oral QAC breakfast  . metoprolol tartrate  50 mg Oral BID  . multivitamin  1 tablet Oral QHS  . mupirocin ointment  1 application Nasal BID  . [START ON  06/08/2017] mycophenolate  500 mg Oral Once per day on Sun Mon Wed Fri   And  . [START ON 06/08/2017] mycophenolate  250 mg Oral Once per day on Sun Mon Wed Fri  . mycophenolate  500 mg Oral 2 times per day on Tue Thu Sat  . pantoprazole  40 mg Oral Daily  . pravastatin  10 mg Oral QHS  . vitamin B-12  1,000 mcg Oral Q breakfast   Continuous Infusions: . sodium chloride    . sodium chloride    . vancomycin 750 mg (06/07/17 1729)   PRN Meds: sodium chloride, sodium chloride, acetaminophen **OR** acetaminophen, albuterol, diphenoxylate-atropine, hydrALAZINE, lidocaine (PF), lidocaine-prilocaine, ondansetron **OR** ondansetron (ZOFRAN) IV, pentafluoroprop-tetrafluoroeth, senna-docusate, traZODone  Time spent: 35 minutes  Author: Berle Mull, MD [facility] Pager: [phone] 06/07/2017 6:16 PM  If 7PM-7AM, please contact night-coverage at [email or web address], password [facility]

## 2017-06-07 NOTE — Clinical Social Work Note (Signed)
Clinical Social Work Assessment  Patient Details  Name: Sabian Kuba MRN: [id number] Date of Birth: 1941/10/10  Date of referral:  06/07/17               Reason for consult:  Facility Placement                Permission sought to share information with:  Facility Sport and exercise psychologist, Family Supports Permission granted to share information::  Yes, Verbal Permission Granted  Name::     Neurosurgeon::  SNFs  Relationship::  Spouse  Contact Information:  [phone]  Housing/Transportation Living arrangements for the past 2 months:  [place], [place] of Information:  Spouse Patient Interpreter Needed:  None Criminal Activity/Legal Involvement Pertinent to Current Situation/Hospitalization:  No - Comment as needed Significant Relationships:  Spouse Lives with:  Spouse Do you feel safe going back to the place where you live?  No Need for family participation in patient care:  Yes (Comment)  Care giving concerns:  CSW received consult for possible SNF placement at time of discharge. CSW spoke with patient's spouse regarding PT recommendation of SNF placement at time of discharge. Patient's spouse reported that patient's spouse is currently unable to care for patient at their home given patient's current physical needs and fall risk. She just had a heart attack and is unable to care for him. Patient expressed understanding of PT recommendation and is agreeable to SNF placement at time of discharge. CSW to continue to follow and assist with discharge planning needs.   Social Worker assessment / plan:  CSW spoke with patient's spouse concerning possibility of rehab at [facility] before returning home.  Employment status:  Retired Nurse, adult PT Recommendations:  [place] / Referral to community resources:  [place]  Patient/Family's Response to care:  Patient's spouse recognizes need  for rehab before returning home and is agreeable to going back to [employer]. She is aware that if patient's insurance approves patient to go to SNF, she will be responsible for $165 a day copay, since patient was recently at [facility] from 6/19 to 7/13.  Patient/Family's Understanding of and Emotional Response to Diagnosis, Current Treatment, and Prognosis:  Patient/family is realistic regarding therapy needs and expressed being hopeful for SNF placement. Patient's spouse expressed understanding of CSW role and discharge process and patient's medical condition. No questions/concerns about plan or treatment.    Emotional Assessment Appearance:  Appears stated age Attitude/Demeanor/Rapport:  Other (Appropriate) Affect (typically observed):  Accepting, Appropriate Orientation:  Oriented to Self, Oriented to Place, Oriented to  Time, Oriented to Situation Alcohol / Substance use:  Not Applicable Psych involvement (Current and /or in the community):  No (Comment)  Discharge Needs  Concerns to be addressed:  Care Coordination Readmission within the last 30 days:  Yes Current discharge risk:  None Barriers to Discharge:  Continued Medical Work up   [employer], [place] 06/07/2017, 12:29 PM

## 2017-06-07 NOTE — Progress Notes (Signed)
Patient arrived to unit per bed.  Reviewed treatment plan and this RN agrees.  Report received from bedside RN, Abby.  Consent verified.  Patient A & O X 3. Lung sounds diminished to ausculation in all fields. No edema. Cardiac: NSR.  Prepped LUAVF with alcohol and cannulated with two 15 gauge needles.  Pulsation of blood noted.  Flushed access well with saline per protocol.  Connected and secured lines and initiated tx at 1500.  UF goal of 2000 mL and net fluid removal of 1500 mL.  Will continue to monitor.

## 2017-06-07 NOTE — NC FL2 (Signed)
[place] LEVEL OF CARE SCREENING TOOL     IDENTIFICATION  Patient Name: [name] [name] Birthdate: 11-23-1940 Sex: male Admission Date (Current Location): 06/06/2017  [facility] and [place] Number:  Herbalist and Address:  [facility], [place] [address], [place], [place] [address]      Provider Number: [id number]  Attending Physician Name and Address:  Lavina Hamman, MD  Relative Name and Phone Number:  [name], spouse, [phone]    Current Level of Care: Hospital Recommended Level of Care: [place] Prior Approval Number:    Date Approved/Denied:   PASRR Number: [id number]  Discharge Plan: SNF    Current Diagnoses: Patient Active Problem List   Diagnosis Date Noted  . Pulmonary edema 06/06/2017  . Coronary artery disease 05/19/2017  . MRSA infection   . Bacteremia   . Pressure injury of skin 05/02/2017  . Cavitary lesion of lung   . HCAP (healthcare-associated pneumonia) 05/01/2017  . Malnutrition of moderate degree 04/25/2017  . Streptococcal bacteremia 04/22/2017  . Acute metabolic encephalopathy 36/46/8032  . Volume overload 02/03/2017  . Generalized weakness 08/22/2016  . Urinary tract infection   . Syncope and collapse   . Syncope 08/07/2016  . Faintness   . Uncontrolled hypertension   . Acute blood loss anemia   . Cardiomyopathy ([place])   . Controlled diabetes mellitus type 2 with complications ([place])   . 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 Traumatic subdural hematoma ([place]) 05/04/2016  . 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 Subdural hematoma ([place]) 05/02/2016  . CKD (chronic kidney disease)   . Protein-calorie malnutrition, severe ([place]) 04/23/2015  . Anemia of chronic disease 04/23/2015  . Depression 04/23/2015  . UTI (urinary tract infection) 04/22/2015  . Barrett's esophagus 03/22/2015  . ESRD on dialysis ([place]) 10/12/2014  . DM (diabetes  mellitus), type 2 with renal complications ([place]) 11/12/8249  . Hypothyroidism 10/06/2014  . Heart transplant, orthotopic, status ([place])   . Hypertensive heart disease   . Hypercholesterolemia 06/20/2014  . 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[place]) 06/20/2014    Orientation RESPIRATION BLADDER Height & Weight     Self, Time, Situation, Place  Normal Continent Weight: 81.2 kg (179 lb) Height:     BEHAVIORAL SYMPTOMS/MOOD NEUROLOGICAL BOWEL NUTRITION STATUS      Continent Diet (Please see DC Summary)  AMBULATORY STATUS COMMUNICATION OF NEEDS Skin   Extensive Assist Verbally Normal                       Personal Care Assistance Level of Assistance  Bathing, Feeding, Dressing Bathing Assistance: Maximum assistance Feeding assistance: Limited assistance Dressing Assistance: Limited assistance     Functional Limitations Info             SPECIAL CARE FACTORS FREQUENCY  PT (By licensed PT)     PT Frequency: 5x/week              Contractures      Additional Factors Info  Code Status, Allergies, Isolation Precautions, Psychotropic, Insulin Sliding Scale Code Status Info: Full Allergies Info: Norvasc Amlodipine Besylate Psychotropic Info: Prozac Insulin Sliding Scale Info: 3x daily with meals Isolation Precautions Info: MRSA     Current Medications (06/07/2017):  This is the current hospital active medication list Current Facility-Administered Medications  Medication Dose Route Frequency Provider Last Rate Last Dose  . 0.9 %  sodium chloride infusion  100 mL  Intravenous PRN Madelon Lips, MD      . 0.9 %  sodium chloride infusion  100 mL Intravenous PRN Madelon Lips, MD      . acetaminophen (TYLENOL) tablet 650 mg  650 mg Oral Q6H PRN Johnson, Clanford L, MD       Or  . acetaminophen (TYLENOL) suppository 650 mg  650 mg Rectal Q6H PRN Johnson, Clanford L, MD      . acidophilus (RISAQUAD) capsule 1 capsule  1 capsule Oral Daily Johnson, Clanford  L, MD   1 capsule at 06/07/17 1031  . albuterol (PROVENTIL) (2.5 MG/3ML) 0.083% nebulizer solution 2.5 mg  2.5 mg Nebulization Q2H PRN Johnson, Clanford L, MD      . aspirin EC tablet 81 mg  81 mg Oral QHS Johnson, Clanford L, MD   81 mg at 06/07/17 0117  . calcium acetate (PHOSLO) capsule 667 mg  667 mg Oral TID WC Johnson, Clanford L, MD   667 mg at 06/07/17 0902  . Chlorhexidine Gluconate Cloth 2 % PADS 6 each  6 each Topical Q0600 Lizabeth Leyden, MD      . cholecalciferol (VITAMIN D) tablet 1,000 Units  1,000 Units Oral Daily Johnson, Clanford L, MD   1,000 Units at 06/07/17 1031  . cycloSPORINE modified (NEORAL) capsule 75 mg  75 mg Oral Daily Johnson, Clanford L, MD   75 mg at 06/07/17 1030   And  . cycloSPORINE modified (NEORAL) capsule 50 mg  50 mg Oral QHS Johnson, Clanford L, MD   50 mg at 06/07/17 0116  . Darbepoetin Alfa (ARANESP) injection 200 mcg  200 mcg Intravenous Q Thu-HD Ejigiri, Thomos Lemons, PA-C      . diphenoxylate-atropine (LOMOTIL) 2.5-0.025 MG per tablet 1 tablet  1 tablet Oral QID PRN Murlean Iba, MD   1 tablet at 06/07/17 0115  . donepezil (ARICEPT) tablet 10 mg  10 mg Oral QHS Johnson, Clanford L, MD   10 mg at 06/07/17 0116  . feeding supplement (NEPRO CARB STEADY) liquid 237 mL  237 mL Oral BID BM Johnson, Clanford L, MD   237 mL at 06/07/17 1044  . FLUoxetine (PROZAC) capsule 20 mg  20 mg Oral Daily Johnson, Clanford L, MD   20 mg at 06/07/17 1031  . heparin injection 5,000 Units  5,000 Units Subcutaneous Q8H Johnson, Clanford L, MD      . hydrALAZINE (APRESOLINE) injection 10 mg  10 mg Intravenous Q4H PRN Johnson, Clanford L, MD      . insulin aspart (novoLOG) injection 0-9 Units  0-9 Units Subcutaneous TID WC Johnson, Clanford L, MD      . levothyroxine (SYNTHROID, LEVOTHROID) tablet 75 mcg  75 mcg Oral QAC breakfast Wynetta Emery, Clanford L, MD   75 mcg at 06/07/17 0902  . lidocaine (PF) (XYLOCAINE) 1 % injection 5 mL  5 mL Intradermal PRN Madelon Lips, MD       . lidocaine-prilocaine (EMLA) cream 1 application  1 application Topical PRN Madelon Lips, MD      . metoprolol tartrate (LOPRESSOR) tablet 50 mg  50 mg Oral BID Rumbarger, Valeda Malm, RPH   50 mg at 06/07/17 1031  . multivitamin (RENA-VIT) tablet 1 tablet  1 tablet Oral QHS Wynetta Emery, Clanford L, MD   1 tablet at 06/07/17 0116  . mupirocin ointment (BACTROBAN) 2 % 1 application  1 application Nasal BID Lizabeth Leyden, MD   1 application at 78/67/67 1031  . [START ON 06/08/2017] mycophenolate (CELLCEPT) capsule 500 mg  500 mg Oral Once per day on Sun Mon Wed Fri Erenest Blank [facility]       And  . Derrill Memo ON 06/08/2017] mycophenolate (CELLCEPT) capsule 250 mg  250 mg Oral Once per day on Sun Mon Wed Fri Erenest Blank, [facility]      . mycophenolate (CELLCEPT) capsule 500 mg  500 mg Oral 2 times per day on Tue Thu Sat Erenest Blank, RPH   500 mg at 06/07/17 1045  . ondansetron (ZOFRAN) tablet 4 mg  4 mg Oral Q6H PRN Johnson, Clanford L, MD       Or  . ondansetron (ZOFRAN) injection 4 mg  4 mg Intravenous Q6H PRN Johnson, Clanford L, MD      . pantoprazole (PROTONIX) EC tablet 40 mg  40 mg Oral Daily Johnson, Clanford L, MD   40 mg at 06/07/17 1031  . pentafluoroprop-tetrafluoroeth (GEBAUERS) aerosol 1 application  1 application Topical PRN Madelon Lips, MD      . pravastatin (PRAVACHOL) tablet 10 mg  10 mg Oral QHS Johnson, Clanford L, MD   10 mg at 06/07/17 0117  . senna-docusate (Senokot-S) tablet 1 tablet  1 tablet Oral QHS PRN Johnson, Clanford L, MD      . traZODone (DESYREL) tablet 25 mg  25 mg Oral QHS PRN Johnson, Clanford L, MD      . vancomycin (VANCOCIN) IVPB 750 mg/150 ml premix  750 mg Intravenous Q T,Th,Sa-HD Erenest Blank, RPH      . vitamin B-12 (CYANOCOBALAMIN) tablet 1,000 mcg  1,000 mcg Oral Q breakfast Wynetta Emery, Clanford L, MD   1,000 mcg at 06/07/17 0901     Discharge Medications: Please see discharge summary for a list of discharge medications.  Relevant Imaging  Results:  Relevant Lab Results:   Additional Information SS#: [id number]   [facility] at [facility] [address] [place], [place]

## 2017-06-07 NOTE — Progress Notes (Signed)
Dialysis treatment completed.  2000 mL ultrafiltrated and net fluid removal 1500 mL.    Patient status unchanged. Lung sounds diminished to ausculation in all fields. No edema. Cardiac: NSR.  Disconnected lines and removed needles.  Pressure held for 10 minutes and band aid/gauze dressing applied.  Report given to bedside RN, Abby.

## 2017-06-08 DIAGNOSIS — Z515 Encounter for palliative care: Secondary | ICD-10-CM

## 2017-06-08 LAB — BASIC METABOLIC PANEL
Anion gap: 11 (ref 5–15)
BUN: 7 mg/dL (ref 6–20)
CHLORIDE: 97 mmol/L — AB (ref 101–111)
CO2: 26 mmol/L (ref 22–32)
CREATININE: 2.82 mg/dL — AB (ref 0.61–1.24)
Calcium: 8.5 mg/dL — ABNORMAL LOW (ref 8.9–10.3)
GFR calc Af Amer: 23 mL/min — ABNORMAL LOW (ref >60)
GFR calc non Af Amer: 20 mL/min — ABNORMAL LOW (ref >60)
Glucose, Bld: 114 mg/dL — ABNORMAL HIGH (ref 65–99)
Potassium: 3.6 mmol/L (ref 3.5–5.1)
SODIUM: 134 mmol/L — AB (ref 135–145)

## 2017-06-08 LAB — MAGNESIUM: MAGNESIUM: 1.8 mg/dL (ref 1.7–2.4)

## 2017-06-08 LAB — CBC
HEMATOCRIT: 30.7 % — AB (ref 39.0–52.0)
HEMOGLOBIN: 9.5 g/dL — AB (ref 13.0–17.0)
MCH: 31.3 pg (ref 26.0–34.0)
MCHC: 30.9 g/dL (ref 30.0–36.0)
MCV: 101 fL — ABNORMAL HIGH (ref 78.0–100.0)
Platelets: 253 10*3/uL (ref 150–400)
RBC: 3.04 MIL/uL — ABNORMAL LOW (ref 4.22–5.81)
RDW: 18.2 % — ABNORMAL HIGH (ref 11.5–15.5)
WBC: 7.9 10*3/uL (ref 4.0–10.5)

## 2017-06-08 LAB — GLUCOSE, CAPILLARY
GLUCOSE-CAPILLARY: 141 mg/dL — AB (ref 65–99)
GLUCOSE-CAPILLARY: 214 mg/dL — AB (ref 65–99)
GLUCOSE-CAPILLARY: 162 mg/dL — AB (ref 65–99)
Glucose-Capillary: 113 mg/dL — ABNORMAL HIGH (ref 65–99)

## 2017-06-08 LAB — PHOSPHORUS: Phosphorus: 1.6 mg/dL — ABNORMAL LOW (ref 2.5–4.6)

## 2017-06-08 MED ORDER — HYDRALAZINE HCL 25 MG PO TABS
25.0000 mg | ORAL_TABLET | Freq: Three times a day (TID) | ORAL | Status: DC
  Administered 2017-06-08 – 2017-06-09 (×5): 25 mg via ORAL
  Filled 2017-06-08 (×5): qty 1

## 2017-06-08 NOTE — Evaluation (Signed)
Occupational Therapy Evaluation Patient Details Name: [name] [name] MRN: [id number] DOB: 02-26-1941 Today's Date: 06/08/2017    History of Present Illness This 76 y.o. male admitted after missing several HD sessions due to family's inability to transfer him.  He was found to have acute hypoxic respiratory, acute encephalopathy, acute pulmonary edema, h/o recent MRSA cavitary PNA with missed doess of antibiotics, cardiomyopathy, uncontrolled HTN.  PMH includes:  s/p heart transplant, chronic Rt UE wound, DM, ESRD on HD, CAD.     Clinical Impression   Pt admitted with above. He demonstrates the below listed deficits and will benefit from continued OT to maximize safety and independence with BADLs.  Pt presents to OT with generalized weakness, decreased activity tolerance, impaired balance.  Cognition is difficult to assess due to severity of HOH.  He engages very minimally in activity, again unsure if this is due to [facility], but doesn't spontaneously attempt to engage, almost appears to be apathetic, but this could be cognitive in nature.   He currently requires max - total A for ADLs except simple grooming and self feeding.  He will need SNF level rehab at discharge.       Follow Up Recommendations  SNF    Equipment Recommendations  None recommended by OT    Recommendations for Other Services       Precautions / Restrictions Precautions Precautions: Fall Restrictions Weight Bearing Restrictions: No      Mobility Bed Mobility Overal bed mobility: Needs Assistance Bed Mobility: Supine to Sit;Sit to Supine     Supine to sit: Mod assist Sit to supine: Max assist   General bed mobility comments: Pt able to assist with moving LEs off bed and with lifting trunk.  He required assist to control descent of trunk and to lift LEs onto bed   Transfers                 General transfer comment: unable to safely attempt as pt requires mod - max A for static sitting EOB     Balance  Overall balance assessment: Needs assistance Sitting-balance support: Feet supported Sitting balance-Leahy Scale: Poor Sitting balance - Comments: Pt sat EOB x 12 mins with mod - max A for static balance.  He frequently leans posteriorly.  He requested to lie down.  Unsure if he was providing max effort with activity                                    ADL either performed or assessed with clinical judgement   ADL Overall ADL's : Needs assistance/impaired Eating/Feeding: Set up;Bed level   Grooming: Wash/dry hands;Wash/dry face;Oral care;Brushing hair;Minimal assistance;Bed level;Sitting   Upper Body Bathing: Maximal assistance;Bed level   Lower Body Bathing: Maximal assistance;Bed level   Upper Body Dressing : Maximal assistance;Bed level   Lower Body Dressing: Total assistance;Bed level   Toilet Transfer: Total assistance Toilet Transfer Details (indicate cue type and reason): unable to safely attempt  Toileting- Clothing Manipulation and Hygiene: Total assistance;Bed level       Functional mobility during ADLs: Maximal assistance (bed mobility only )       Vision         Perception     Praxis      Pertinent Vitals/Pain Pain Assessment: Faces Faces Pain Scale: No hurt     Hand Dominance Right   Extremity/Trunk Assessment Upper Extremity Assessment Upper Extremity Assessment: Generalized weakness  Lower Extremity Assessment Lower Extremity Assessment: Defer to PT evaluation   Cervical / Trunk Assessment Cervical / Trunk Assessment: Kyphotic   Communication Communication Communication: HOH (severly HOH)   Cognition Arousal/Alertness: Awake/alert Behavior During Therapy: Flat affect Overall Cognitive Status: Difficult to assess                                 General Comments: Pt engages very little in activity - this may be due to severity of HOH, but little spontaneous attempts to engage.  He appears somewhat apathetic.       General Comments       Exercises     Shoulder Instructions      Home Living Family/patient expects to be discharged to:: Skilled nursing facility                                 Additional Comments: Pt was recently discharged home from SNF with family providing assist/care       Prior Functioning/Environment Level of Independence: Needs assistance  Gait / Transfers Assistance Needed: Per chart, pt has been bedbound since discharge home from SNF  ADL's / Homemaking Assistance Needed: pt required assist for all aspects             OT Problem List: Decreased strength;Decreased activity tolerance;Impaired balance (sitting and/or standing);Decreased safety awareness;Decreased knowledge of use of DME or AE      OT Treatment/Interventions: Self-care/ADL training;DME and/or AE instruction;Therapeutic activities;Patient/family education;Balance training;Therapeutic exercise    OT Goals(Current goals can be found in the care plan section) Acute Rehab OT Goals Patient Stated Goal: to get stronger and start sitting up OT Goal Formulation: Patient unable to participate in goal setting (due to [facility] ) Time For Goal Achievement: 06/22/17 Potential to Achieve Goals: Good ADL Goals Pt Will Perform Grooming: with min assist;sitting Pt Will Perform Upper Body Bathing: with min assist;sitting Pt Will Transfer to Toilet: with max assist;squat pivot transfer;bedside commode Additional ADL Goal #1: Pt will maintain EOB sitting with min A while performing simple grooming activities.   OT Frequency: Min 2X/week   Barriers to D/C: Decreased caregiver support          Co-evaluation              AM-PAC PT "6 Clicks" Daily Activity     Outcome Measure Help from another person eating meals?: A Little Help from another person taking care of personal grooming?: A Little Help from another person toileting, which includes using toliet, bedpan, or urinal?: Total Help from  another person bathing (including washing, rinsing, drying)?: A Lot Help from another person to put on and taking off regular upper body clothing?: A Lot Help from another person to put on and taking off regular lower body clothing?: Total 6 Click Score: 12   End of Session Nurse Communication: Mobility status  Activity Tolerance: Patient limited by lethargy Patient left: in bed;with call bell/phone within reach  OT Visit Diagnosis: Muscle weakness (generalized) (M62.81)                Time: 1037-1050 OT Time Calculation (min): 13 min Charges:  OT General Charges $OT Visit: 1 Procedure OT Evaluation $OT Eval Moderate Complexity: 1 Procedure G-Codes:     Lucille Passy, OTR/L [phone]   Talty, Ouida Abeyta M 06/08/2017, 12:12 PM

## 2017-06-08 NOTE — Progress Notes (Addendum)
[facility] Progress Note  Patient: [name] [name] CXK:[id number]   PCP: Junie Panning, NP DOB: 12-Feb-1941   DOA: 06/06/2017   DOS: 06/08/2017   Date of Service: the patient was seen and examined on 06/08/2017  Subjective: More sleepy today. But stable to follow command. Very hard of hearing therefore review of systems Limited. No acute complaints now acute events. Wife at bedside on repeat evaluation consult regarding growth on the right arm.  Brief hospital course: Pt. with PMH of ESRD on HD TTS, CAD, DM, HTN, cardiac transplant 2001; admitted on 06/06/2017, presented with complaint of missing hemodialysis and shortness of breath, was found to have acute hypoxic respiratory failure as well as acute encephalopathy. Currently further plan is continue hemodialysis.  Assessment and Plan:  1. Acute pulmonary edema - S/P emergent HD. Repeat HD tomorrow. Improvement in oxygenation as well as mentation with treatment. Monitor. 2. ESRD on hemodialysis - per nephrology team - see notes.  Follow BMP, Mg, Phos daily.  3. Volume Overload - being managed with hemodialysis per Dr. Bishop Dublin orders. 4. Anemia of CKD - stable, will follow.   5. Leukocytosis - missed several doses of vancomycin, last dose was 7/12. Most likely stress reaction. 6. MRSA cavitary pneumonia - Pt has missed some doses.  He had been scheduled to complete course on 06/11/17.  7. Diarrhea - this is not new, but given recent hospitalization and SNF CDiff is checked which is negative. Diarrhea better 8. Generalized weakness - this is a complicated issue, he likely is going to need long-term skilled care but family saying that they cannot afford to pay for it. consulted PT/OT, social worker for assistance.  also asked care management for assistance with disposition.   9. Severe protein calorie malnutrition - longstanding problem, will consult dietitian for assistance. Advance to regular diet instead of renal 10. Wound RUE - per  wife, pt had apparently been followed for this by ID Baxter Flattery, will consult [place] nurse to evaluate.   11. DM type 2 with renal complications - monitor blood glucose and provide sensitive sliding scale as needed.   12. Cardiomyopathy - Volume management as noted above with hemodialysis.   13. Uncontrolled hypertension - likely exacerbated by volume overload and missed doses of home medications.  It should improve with hemodialysis.  Nephrology said to restart home medications, IV hydralazine ordered as needed.   14. CAD - stable, no chest pain, stable EKG.  15. S/p heart transplant - resume all home anti-rejection medications  16. Right upper arm wound, full-thickness lesion. Present since last few months and progressively worsening. 3 x 3 x 5 cm. Outpatient dermatology referral recommended for biopsy.  Diet: Regular diet DVT Prophylaxis: subcutaneous Heparin  Advance goals of care discussion: full code, palliative care consulted for goals of care discussion as well as continuation of hemodialysis.  Family Communication: no family was present at bedside, at the time of interview.  Disposition:  Discharge to SNF tomorrow after HD.  Consultants: neprology, palliaitive care Procedures: HD  Antibiotics: Anti-infectives    Start     Dose/Rate Route Frequency Ordered Stop   06/07/17 1200  vancomycin (VANCOCIN) IVPB 750 mg/150 ml premix     750 mg 150 mL/hr over 60 Minutes Intravenous Every T-Th-Sa (Hemodialysis) 06/07/17 0157     06/07/17 0200  vancomycin (VANCOCIN) 500 mg in sodium chloride 0.9 % 100 mL IVPB     500 mg 100 mL/hr over 60 Minutes Intravenous  Once 06/07/17 0150 06/07/17 0402  06/06/17 2100  vancomycin (VANCOCIN) 500-5 MG/100ML-% IVPB    Comments:  [place]son, [place]   : cabinet override      06/06/17 2100 06/06/17 2103   06/06/17 1600  vancomycin (VANCOCIN) 500 mg in sodium chloride 0.9 % 100 mL IVPB  Status:  Discontinued     500 mg 100 mL/hr over 60 Minutes Intravenous  Once  06/06/17 1548 06/06/17 1557   06/06/17 1600  vancomycin (VANCOCIN) IVPB 1000 mg/200 mL premix  Status:  Discontinued     1,000 mg 200 mL/hr over 60 Minutes Intravenous  Once 06/06/17 1557 06/07/17 0150       Objective: Physical Exam: Vitals:   06/08/17 0300 06/08/17 0411 06/08/17 0741 06/08/17 1300  BP: (!) 210/72 (!) 161/64 (!) 171/60 (!) 184/63  Pulse:  71 66 63  Resp:  19 18 16   Temp:  97.9 F (36.6 C) 98 F (36.7 C)   TempSrc:  Oral Oral   SpO2:  100% 100% 100%  Weight:        Intake/Output Summary (Last 24 hours) at 06/08/17 1640 Last data filed at 06/08/17 0900  Gross per 24 hour  Intake              270 ml  Output             1500 ml  Net            -1230 ml   Filed Weights   06/07/17 1452 06/07/17 1902 06/07/17 1942  Weight: 82.7 kg (182 lb 5.1 oz) 81.2 kg (179 lb 0.2 oz) 81.2 kg (179 lb 0.2 oz)   General: Alert, Awake and Oriented to Time, Place and Person. Appear in mild distress, affect appropriate Eyes: PERRL, Conjunctiva normal ENT: Oral Mucosa clear moist. Neck: no JVD, no Abnormal Mass Or lumps Cardiovascular: S1 and S2 Present, aortic systolic Murmur, Peripheral Pulses Present Respiratory: normal respiratory effort, Bilateral Air entry equal and Decreased, no use of accessory muscle, Clear to Auscultation, no Crackles, no wheezes Abdomen: Bowel Sound present, Soft and no tenderness, no hernia Skin: no redness, no Rash, no induration Extremities: no Pedal edema, no calf tenderness Neurologic: Grossly no focal neuro deficit. Bilaterally Equal motor strength  Data Reviewed: CBC:  Recent Labs Lab 06/06/17 1424 06/06/17 1447 06/07/17 0006 06/07/17 0508 06/08/17 0531  WBC 11.7*  --  6.8 7.5 7.9  NEUTROABS 7.6  --   --   --   --   HGB 10.0* 11.2* 9.9* 9.3* 9.5*  HCT 32.1* 33.0* 31.9* 30.3* 30.7*  MCV 98.8  --  99.1 98.7 101.0*  PLT 319  --  281 267 888   Basic Metabolic Panel:  Recent Labs Lab 06/06/17 1424 06/06/17 1447 06/07/17 0006  06/07/17 0508 06/08/17 0531  NA 137 137  --  137 134*  K 4.7 4.7  --  3.4* 3.6  CL 98* 99*  --  97* 97*  CO2 26  --   --  29 26  GLUCOSE 132* 130*  --  68 114*  BUN 54* 51*  --  14 7  CREATININE 9.14* 9.30* 3.56* 3.93* 2.82*  CALCIUM 8.8*  --   --  8.1* 8.5*  MG  --   --   --  1.9 1.8  PHOS  --   --   --  2.5 1.6*    Liver Function Tests: No results for input(s): AST, ALT, ALKPHOS, BILITOT, PROT, ALBUMIN in the last 168 hours. No results for input(s): LIPASE, AMYLASE  in the last 168 hours. No results for input(s): AMMONIA in the last 168 hours. Coagulation Profile: No results for input(s): INR, PROTIME in the last 168 hours. Cardiac Enzymes:  Recent Labs Lab 06/06/17 1424  TROPONINI <0.03   BNP (last 3 results) No results for input(s): PROBNP in the last 8760 hours. CBG:  Recent Labs Lab 06/07/17 0751 06/07/17 1209 06/07/17 1939 06/08/17 0725 06/08/17 1139  GLUCAP 73 89 115* 113* 141*   Studies: No results found.  Scheduled Meds: . acidophilus  1 capsule Oral Daily  . aspirin EC  81 mg Oral QHS  . Chlorhexidine Gluconate Cloth  6 each Topical Q0600  . cholecalciferol  1,000 Units Oral Daily  . cycloSPORINE modified  75 mg Oral Daily   And  . cycloSPORINE modified  50 mg Oral QHS  . darbepoetin (ARANESP) injection - DIALYSIS  200 mcg Intravenous Q Thu-HD  . donepezil  10 mg Oral QHS  . feeding supplement (NEPRO CARB STEADY)  237 mL Oral BID BM  . FLUoxetine  20 mg Oral Daily  . heparin  5,000 Units Subcutaneous Q8H  . hydrALAZINE  25 mg Oral Q8H  . insulin aspart  0-9 Units Subcutaneous TID WC  . levothyroxine  75 mcg Oral QAC breakfast  . metoprolol tartrate  50 mg Oral BID  . multivitamin  1 tablet Oral QHS  . mupirocin ointment  1 application Nasal BID  . mycophenolate  500 mg Oral Once per day on Sun Mon Wed Fri   And  . mycophenolate  250 mg Oral Once per day on Sun Mon Wed Fri  . mycophenolate  500 mg Oral 2 times per day on Tue Thu Sat  .  pantoprazole  40 mg Oral Daily  . pravastatin  10 mg Oral QHS  . vitamin B-12  1,000 mcg Oral Q breakfast   Continuous Infusions: . sodium chloride    . sodium chloride    . vancomycin 750 mg (06/07/17 1729)   PRN Meds: sodium chloride, sodium chloride, acetaminophen **OR** acetaminophen, albuterol, diphenoxylate-atropine, hydrALAZINE, lidocaine (PF), lidocaine-prilocaine, ondansetron **OR** ondansetron (ZOFRAN) IV, pentafluoroprop-tetrafluoroeth, senna-docusate, traZODone  Time spent: 35 minutes  Author: Berle Mull, MD [facility] Pager: [phone] 06/08/2017 4:40 PM  If 7PM-7AM, please contact night-coverage at [email or web address], password [facility]

## 2017-06-08 NOTE — Progress Notes (Signed)
[facility] Progress Note   Subjective:  Seen in room. Hard of hearing, so communication is difficult, but looks comfortable. Denies dyspnea this time.  Objective Vitals:   06/07/17 1942 06/08/17 0300 06/08/17 0411 06/08/17 0741  BP: (!) 182/65 (!) 210/72 (!) 161/64 (!) 171/60  Pulse: 66  71 66  Resp: 20  19 18   Temp: 98 F (36.7 C)  97.9 F (36.6 C) 98 F (36.7 C)  TempSrc: Oral  Oral Oral  SpO2: 100%  100% 100%  Weight: 81.2 kg (179 lb 0.2 oz)      Physical Exam General: Pleasant, frail appearing. NAD  Heart: RRR; no murmur Lungs: CTAB Extremities: no LE edema  Dialysis Access: LUE AVF + bruit   Additional Objective Labs: Basic Metabolic Panel:  Recent Labs Lab 06/06/17 1424 06/06/17 1447 06/07/17 0006 06/07/17 0508 06/08/17 0531  NA 137 137  --  137 134*  K 4.7 4.7  --  3.4* 3.6  CL 98* 99*  --  97* 97*  CO2 26  --   --  29 26  GLUCOSE 132* 130*  --  68 114*  BUN 54* 51*  --  14 7  CREATININE 9.14* 9.30* 3.56* 3.93* 2.82*  CALCIUM 8.8*  --   --  8.1* 8.5*  PHOS  --   --   --  2.5 1.6*   CBC:  Recent Labs Lab 06/06/17 1424  06/07/17 0006 06/07/17 0508 06/08/17 0531  WBC 11.7*  --  6.8 7.5 7.9  NEUTROABS 7.6  --   --   --   --   HGB 10.0*  < > 9.9* 9.3* 9.5*  HCT 32.1*  < > 31.9* 30.3* 30.7*  MCV 98.8  --  99.1 98.7 101.0*  PLT 319  --  281 267 253  < > = values in this interval not displayed.  Cardiac Enzymes:  Recent Labs Lab 06/06/17 1424  TROPONINI <0.03   CBG:  Recent Labs Lab 06/07/17 0751 06/07/17 1209 06/07/17 1939 06/08/17 0725  GLUCAP 73 89 115* 113*   Studies/Results: Dg Chest 2 View  Result Date: 06/06/2017 CLINICAL DATA:  Bilateral lower extremity swelling. EXAM: CHEST  2 VIEW COMPARISON:  CT 05/01/2017 .  Chest x-ray 05/01/2017, 05/19/2017. FINDINGS: Prior CABG. Stable cardiomegaly. Ill-defined densities noted throughout both lungs again noted. These are better demonstrated by prior CT of 05/01/2017.  New infiltrate in the left suprahilar region suggesting pneumonia versus asymmetric pulmonary edema noted. IMPRESSION: 1. Ill-defined densities noted throughout both lungs again noted, reference is made to prior CT of 05/01/2017. Follow-up CT can be obtained. 2. New mild left suprahilar infiltrate versus asymmetric pulmonary edema. 3.  Prior CABG.  Stable cardiomegaly. Electronically Signed   By: Marcello Moores  Register   On: 06/06/2017 15:19   Portable Chest 1 View  Result Date: 06/07/2017 CLINICAL DATA:  Volume overload EXAM: PORTABLE CHEST 1 VIEW COMPARISON:  06/06/2017 FINDINGS: Cardiac shadow is stable. Postsurgical changes are again noted. Pleural based density is again seen in the right apex stable from previous exams but decreased from a prior CT obtained in June of 2018. No new focal infiltrate or sizable effusion is seen. IMPRESSION: Stable right apical density when compared with the prior exam. No new focal abnormality is seen. Electronically Signed   By: Inez Catalina M.D.   On: 06/07/2017 08:19   Medications: . sodium chloride    . sodium chloride    . vancomycin 750 mg (06/07/17 1729)   . acidophilus  1 capsule Oral Daily  . aspirin EC  81 mg Oral QHS  . calcium acetate  667 mg Oral TID WC  . Chlorhexidine Gluconate Cloth  6 each Topical Q0600  . cholecalciferol  1,000 Units Oral Daily  . cycloSPORINE modified  75 mg Oral Daily   And  . cycloSPORINE modified  50 mg Oral QHS  . darbepoetin (ARANESP) injection - DIALYSIS  200 mcg Intravenous Q Thu-HD  . donepezil  10 mg Oral QHS  . feeding supplement (NEPRO CARB STEADY)  237 mL Oral BID BM  . FLUoxetine  20 mg Oral Daily  . heparin  5,000 Units Subcutaneous Q8H  . insulin aspart  0-9 Units Subcutaneous TID WC  . levothyroxine  75 mcg Oral QAC breakfast  . metoprolol tartrate  50 mg Oral BID  . multivitamin  1 tablet Oral QHS  . mupirocin ointment  1 application Nasal BID  . mycophenolate  500 mg Oral Once per day on Sun Mon Wed Fri    And  . mycophenolate  250 mg Oral Once per day on Sun Mon Wed Fri  . mycophenolate  500 mg Oral 2 times per day on Tue Thu Sat  . pantoprazole  40 mg Oral Daily  . pravastatin  10 mg Oral QHS  . vitamin B-12  1,000 mcg Oral Q breakfast    Dialysis Orders: [facility] TTS EDW 64.5  HD 4 hrs Bath 2K 2.25 Ca, no heparin, L AVF - Vancomycin 500 mg q HD until 7/23 - Mircera 225 mcg q 2 weeks (ordered, but never given). Last given 285mcg on 7/10. - No VDRA/sensipar.  Assessment/Plan: 1. Weakness: Ongoing issue. Wife is experiencing caregiver fatigue, may need placement after d/c as she states that she can't take care of him at home. 2. ESRD: S/p HD 7/18 and 7/19, now back to TTS schedule, next 7/21. 3K bath. 3. HTN/volume: BP still high and over EDW (?wt error), UF as tolerated with HD tomorrow. Home med list shows metoprolol 50mg  BID and hydralazine 25mg  TID-> will resume the hydralazine. 4. Anemia: Hgb 9.5, Aranesp 267mcg weekly resumed 7/19. 5. Secondary hyperparathyroidism:  Ca ok, Phos low. Will hold Phoslo for now. Remains on Vit D3 1000 daily.  6. Nutrition:  Continue Nepro. 7. Recent MRSA pneumonia: Has been getting Vancomycin with HD (end date 7/23), continue. 8. Hx heart transplant (2001): Continue CellCept and Cyclosporine. 9: R forearm wound: Wound care RN recommended surgical biopsy. 10: Diarrhea: Chronic issue, C.diff 7/19 negative. 11: Dispo: Consider SNF. Palliative care consult may be appropriate (wife and patient are ambivalent about stopping HD).  Veneta Penton, PA-C 06/08/2017, 9:09 AM  [facility] Pager: [phone]

## 2017-06-08 NOTE — Consult Note (Addendum)
Consultation Note Date: 06/08/2017   Patient Name: [name] [name]  DOB: March 01, 1941  MRN: [id number]  Age / Sex: 76 y.o., male  PCP: Smothers, Andree Elk, NP Referring Physician: Lavina Hamman, MD  Reason for Consultation: Establishing goals of care and Psychosocial/spiritual support  HPI/Patient Profile: 76 y.o. 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 He was found to have acute, pulmonary edema, and emergency hemodialysis was indicated. Patient has had multiple hospitalizations over the past 6 months. He has recently required skilled nursing care for rehabilitation. He is extremely weak and at this point, per chart review, is requiring a Hoyer lift to move from the wheelchair to a regular chair. Consult is for goals of care, and concern that patient is no longer a candidate to do outpatient hemodialysis because of his deconditioning and weakness..   Clinical Assessment and Goals of Care: Patient is drowsy this morning. He is extremely hard of hearing. He was able to be awakened, answer a few simple questions. He verbalizes "I'm better". He does not remember what brought him to the hospital but he is able to tell me that he lives in [place] which is the skilled nursing facility that he has most recently been in. He recognizes that he is very weak but states prior to coming to the hospital that he could walk a little bit with a walker. He states he is from [name] and that he and his wife moved to [place] to be closer to their son because of  failing health.   Per chart review, and patient, he is married and spouse would be his  surrogate [name] [name]. This is [name] [name] at [phone].     SUMMARY OF RECOMMENDATIONS   Patient at this point has a fluctuating level of consciousness in addition to hearing deficits. I'm concerned that he does not fully appreciate his level of debility at this point. Have left a message with patient's wife, [name] [name], to arrange a goals of care meeting to address continued hemodialysis as well as CODE STATUS If patient remains this debilitated and unable to transfer to a chair and sit up on his own for 4 hours he would not be able to continue with community dialysis. If he wishes to continue with HD his options are very limited, potentially LTAC only  Addendum 1700: Met with [name] [name]. She clearly articulates decline and recognizes he is nearing EOL. 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 She states he is worried about [name] and she has not known how to approach him about whether or not he wants to continue with HD. She and [name] son  she reports, support whatever his decision is. She understands that if he were to stop HD his prognosis would be days to weeks, and at that point would want to take him home with hospice. She also reports that [name] was where he was before and they were transporting him to HD and utilizing a hoyer lift and that he could return there if he wants to continue HD. She does not think he would want to continue HD if he were unable to return to [name] is just down the street from their apartment ); " being together is our quality of life". We agreed that I would call [name] at 0800 06/09/17 to arrange FU mtg to discuss continuation or discontinuation of HD. She is aware that I am changing his code status to DNR today, Code Status/Advance Care Planning:  DNR    Symptom  Management:   Dyspnea: Continue with targeted pulmonary treatments, O2, nebulizers  Palliative Prophylaxis:   Aspiration and Oral Care  Additional Recommendations (Limitations, Scope, Preferences):  Full Scope Treatment  Psycho-social/Spiritual:   Desire for further Chaplaincy support:no  Prognosis:   Difficult to determine prognosis at this point. If patient were to stop hemodialysis, or no longer be able to undergo hemodialysis, he would have a prognosis of less than 2 weeks. If LTAC as an option, and patient wishes to continue hemodialysis then his prognosis likely would still be limited but less determinant.   As per addendum above, [facility] reportedly will take pt back and transport him to HD utilizing a hoyer lift  Discharge Planning: To Be Determined      Primary Diagnoses: Present on Admission: . Volume overload . Uncontrolled hypertension . Protein-calorie malnutrition, severe ([place]) . Pressure injury of skin . Hypothyroidism . Hypertensive heart disease . Hypercholesterolemia . Controlled diabetes mellitus type 2 with complications ([place]) . Coronary artery disease . Pulmonary edema   I have reviewed the medical record, interviewed the patient and family, and examined the patient. The following aspects are pertinent.  Past Medical History:  Diagnosis Date  . Anemia   . Cancer ([place])    SKIN  . CKD III-IV    a. since transplant in 2001 with significant worsening since 06/2013.  Marland Kitchen Coronary artery disease   . Dementia    mild  . Diabetes mellitus without complication (HCC)    FASTING CBG 90S  . Headache(784.0)   . Heart transplanted ([place])    a. 1995 CABG x 4 in [place], [place];  b. 2001 Developed CHF;  c. 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 10/2000 Cardiac transplant @ [facility], [place];  e. Historically nl Bx and stress testing.  Last stress test ~ 5 yrs ago.  Marland Kitchen Hernia of abdominal wall   . History of blood transfusion   . History of stroke   .  Hypercalcemia    a. 06/2013 Hospitalized in [place].  Marland Kitchen Hyperlipidemia   . Hypertension    a. Long h/o HTN, came off of all meds following transplant/wt loss-->meds resumed 06/2013, difficult to control since.  . Myocardial infarction ([place])    PRIOR TO HEART TRANSPLANT  . Orthostatic dizziness   . Orthostatic hypotension    a. Has tried support hose and compression stockings - prefers not to wear.  . Shortness of breath    EXERTION  . Stroke ([place]) 06/2013   NO RESIDUAL - WAS CONFUSED AT TIME - THAT HAS RESOLVED   Social History  Social History  . Marital status: Married    Spouse name: N/A  . Number of children: 1  . Years of education: N/A   Occupational History  . Education officer, community    Social History Main Topics  . Smoking status: Never Smoker  . Smokeless tobacco: Never Used  . Alcohol use No  . Drug use: No  . Sexual activity: No   Other Topics Concern  . None   Social History Narrative   Patient is from [place].  Moved to [name] in late '90's.  Moved to [place] 06/20/2014 to live closer to son.  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  Plans to continue to work now that he is in [place].   Admitted to [facility] 04/26/2017   [facility]   Never smoked   Alcohol none   DNR      Family History  Problem Relation Age of Onset  . Heart attack Father        died @ 67  . Diabetes Father   . Heart attack Mother   . Diabetes Mother   . Kidney disease Maternal Aunt    Scheduled Meds: . acidophilus  1 capsule Oral Daily  . aspirin EC  81 mg Oral QHS  . Chlorhexidine Gluconate Cloth  6 each Topical Q0600  . cholecalciferol  1,000 Units Oral Daily  . cycloSPORINE modified  75 mg Oral Daily   And  . cycloSPORINE modified  50 mg Oral QHS  . darbepoetin (ARANESP) injection - DIALYSIS  200 mcg Intravenous Q Thu-HD  . donepezil  10 mg Oral QHS  . feeding supplement (NEPRO CARB STEADY)  237 mL Oral BID BM  . FLUoxetine  20 mg Oral Daily  .  heparin  5,000 Units Subcutaneous Q8H  . hydrALAZINE  25 mg Oral Q8H  . insulin aspart  0-9 Units Subcutaneous TID WC  . levothyroxine  75 mcg Oral QAC breakfast  . metoprolol tartrate  50 mg Oral BID  . multivitamin  1 tablet Oral QHS  . mupirocin ointment  1 application Nasal BID  . mycophenolate  500 mg Oral Once per day on Sun Mon Wed Fri   And  . mycophenolate  250 mg Oral Once per day on Sun Mon Wed Fri  . mycophenolate  500 mg Oral 2 times per day on Tue Thu Sat  . pantoprazole  40 mg Oral Daily  . pravastatin  10 mg Oral QHS  . vitamin B-12  1,000 mcg Oral Q breakfast   Continuous Infusions: . sodium chloride    . sodium chloride    . vancomycin 750 mg (06/07/17 1729)   PRN Meds:.sodium chloride, sodium chloride, acetaminophen **OR** acetaminophen, albuterol, diphenoxylate-atropine, hydrALAZINE, lidocaine (PF), lidocaine-prilocaine, ondansetron **OR** ondansetron (ZOFRAN) IV, pentafluoroprop-tetrafluoroeth, senna-docusate, traZODone Medications Prior to Admission:  Prior to Admission medications   Medication Sig Start Date End Date Taking? Authorizing Provider  acetaminophen (TYLENOL) 325 MG tablet Take 2 tablets (650 mg total) by mouth every 6 (six) hours as needed for mild pain (or Fever >/= 101). Patient taking differently: Take 325-650 mg by mouth 2 (two) times daily.  10/14/14  Yes Delfina Redwood, MD  aspirin EC 81 MG tablet Take 81 mg by mouth at bedtime.   Yes [provider]  calcium acetate (PHOSLO) 667 MG capsule Take 1 capsule (667 mg total) by mouth 3 (three) times daily with meals. 08/24/16  Yes Ghimire, Henreitta Leber, MD  cholecalciferol (VITAMIN D) 1000 UNITS tablet Take 1,000 Units by mouth daily.   Yes [provider]  cycloSPORINE modified (NEORAL) 25 MG capsule Take 50-75 mg by mouth See admin instructions. 75 mg in the morning and 50 mg in the evening 08/18/14  Yes Crenshaw, Denice Bors, MD  diphenoxylate-atropine (LOMOTIL) 2.5-0.025 MG tablet  Take 1 tablet by mouth 4 (four) times daily as needed for diarrhea or loose stools. 08/24/16  Yes Ghimire, Henreitta Leber, MD  donepezil (ARICEPT) 10 MG tablet Take 10 mg by mouth at bedtime.  03/01/15  Yes [provider]  FLUoxetine (PROZAC) 20 MG capsule Take 20 mg by mouth daily.    Yes [provider]  lansoprazole (PREVACID) 30 MG capsule Take 30 mg by mouth 4 (four) times a week. Sunday, Monday, Wednesday, Friday 12/22/15  Yes [provider]  levothyroxine (SYNTHROID, LEVOTHROID) 75 MCG tablet Take 75 mcg by mouth daily.    Yes [provider]  metoprolol tartrate (LOPRESSOR) 25 MG tablet Take 2 tablets (50 mg total) by mouth 2 (two) times daily. 12/27/14  Yes Rai, Ripudeep K, MD  multivitamin (RENA-VIT) TABS tablet Take 1 tablet by mouth daily.  03/08/15  Yes [provider]  mycophenolate (CELLCEPT) 250 MG capsule Take 250-500 mg by mouth daily. 500mg in am, 250mg in pm, except for Tuesday, Thursday and Saturdays pt takes 500mg twice daily   Yes [provider]  Nutritional Supplements (FEEDING SUPPLEMENT, NEPRO CARB STEADY,) LIQD Take 237 mLs by mouth 2 (two) times daily between meals. 04/25/17  Yes Grunz, Ryan B, MD  pravastatin (PRAVACHOL) 10 MG tablet Take 10 mg by mouth at bedtime.    Yes [provider]  Probiotic Product (PRO-BIOTIC BLEND) CAPS Take 1 capsule by mouth daily.   Yes [provider]  vitamin B-12 (CYANOCOBALAMIN) 1000 MCG tablet Take 1,000 mcg by mouth daily with breakfast.   Yes [provider]   Allergies  Allergen Reactions  . Norvasc [Amlodipine Besylate] Swelling and Other (See Comments)    Swelling of the lower body    Review of Systems  Physical Exam  Vital Signs: BP (!) 171/60 (BP Location: Right Arm)   Pulse 66   Temp 98 F (36.7 C) (Oral)   Resp 18   Wt 81.2 kg (179 lb 0.2 oz)   SpO2 100%   BMI 22.98 kg/m  Pain Assessment: No/denies pain   Pain Score: 0-No pain   SpO2: SpO2:  100 % O2 Device:SpO2: 100 % O2 Flow Rate: .   IO: Intake/output summary:  Intake/Output Summary (Last 24 hours) at 06/08/17 1312 Last data filed at 06/08/17 0900  Gross per 24 hour  Intake              390 ml  Output             15 00 ml  Net            -1110 ml    LBM: Last BM Date: 06/06/17 Baseline Weight: Weight: 81.5 kg (179 lb 10.8 oz) Most recent weight: Weight: 81.2 kg (179 lb 0.2 oz)     Palliative Assessment/Data:   Flowsheet Rows     Most Recent Value  Intake Tab  Referral Department  Hospitalist  Unit at Time of Referral  Med/Surg Unit  Palliative Care Primary Diagnosis  Nephrology  Date Notified  06/07/17  Palliative Care Type  New Palliative care  Reason for referral  Clarify Goals of Care  Date of  Admission  06/06/17  Date first seen by Palliative Care  06/08/17  # of days Palliative referral response time  1 Day(s)  # of days IP prior to Palliative referral  1  Clinical Assessment  Palliative Performance Scale Score  40%  Pain Max last 24 hours  Not able to report  Pain Min Last 24 hours  Not able to report  Dyspnea Max Last 24 Hours  Not able to report  Dyspnea Min Last 24 hours  Not able to report  Nausea Max Last 24 Hours  Not able to report  Nausea Min Last 24 Hours  Not able to report  Anxiety Max Last 24 Hours  Not able to report  Anxiety Min Last 24 Hours  Not able to report  Other Max Last 24 Hours  Not able to report  Psychosocial & Spiritual Assessment  Palliative Care Outcomes  Patient/Family meeting held?  Yes  Who was at the meeting?  pt,  LM for spouse  Palliative Care follow-up planned  Yes, Facility      Time In: 1200 Time Out: 1310 Time Total: 70 min Greater than 50%  of this time was spent counseling and coordinating care related to the above assessment and plan.Staffed with Dr. Posey Pronto  Signed by: Dory Horn, NP   Please contact Palliative Medicine Team phone at [phone] for questions and concerns.  For individual  provider: See Shea Evans

## 2017-06-08 NOTE — Progress Notes (Signed)
CSW met with patient and wife at bedside and informed them that Craig Staggers is given through the weekend. If patient does not discharge over the weekend, new Josem Kaufmann will be needed. Patient and wife remain agreeable for patient to discharge to [facility] when medically ready. Patient's wife with questions about patient's discharge date and about the growth on patient's arm. Wife asking to speak with MD. CSW paged MD about wife concerns. CSW to continue to follow and support with discharge.  Estanislado Emms, [place]

## 2017-06-08 NOTE — Progress Notes (Signed)
OT Cancellation Note  Patient Details Name: [name] [name] MRN: [id number] DOB: 07/20/1941   Cancelled Treatment:    Reason Eval/Treat Not Completed: Other (comment) (eating breakfast).  Tamon Parkerson Norton, OTR/L [phone]   Lucille Passy M 06/08/2017, 9:00 AM

## 2017-06-08 NOTE — Progress Notes (Signed)
[employer] authorization received for patient to go to [facility] over weekend. Will need new auth on Monday.   [name] LCSWA [phone]

## 2017-06-09 LAB — CBC
HEMATOCRIT: 29.5 % — AB (ref 39.0–52.0)
Hemoglobin: 8.8 g/dL — ABNORMAL LOW (ref 13.0–17.0)
MCH: 30.3 pg (ref 26.0–34.0)
MCHC: 29.8 g/dL — AB (ref 30.0–36.0)
MCV: 101.7 fL — ABNORMAL HIGH (ref 78.0–100.0)
Platelets: 263 10*3/uL (ref 150–400)
RBC: 2.9 MIL/uL — ABNORMAL LOW (ref 4.22–5.81)
RDW: 18.4 % — AB (ref 11.5–15.5)
WBC: 8 10*3/uL (ref 4.0–10.5)

## 2017-06-09 LAB — BASIC METABOLIC PANEL
Anion gap: 10 (ref 5–15)
BUN: 22 mg/dL — ABNORMAL HIGH (ref 6–20)
CALCIUM: 8.6 mg/dL — AB (ref 8.9–10.3)
CO2: 27 mmol/L (ref 22–32)
CREATININE: 4.34 mg/dL — AB (ref 0.61–1.24)
Chloride: 99 mmol/L — ABNORMAL LOW (ref 101–111)
GFR calc non Af Amer: 12 mL/min — ABNORMAL LOW (ref >60)
GFR, EST AFRICAN AMERICAN: 14 mL/min — AB (ref >60)
Glucose, Bld: 109 mg/dL — ABNORMAL HIGH (ref 65–99)
Potassium: 4.1 mmol/L (ref 3.5–5.1)
SODIUM: 136 mmol/L (ref 135–145)

## 2017-06-09 LAB — PHOSPHORUS: PHOSPHORUS: 2.1 mg/dL — AB (ref 2.5–4.6)

## 2017-06-09 LAB — GLUCOSE, CAPILLARY: Glucose-Capillary: 93 mg/dL (ref 65–99)

## 2017-06-09 LAB — MAGNESIUM: Magnesium: 2 mg/dL (ref 1.7–2.4)

## 2017-06-09 MED ORDER — ACETAMINOPHEN 325 MG PO TABS
ORAL_TABLET | ORAL | Status: AC
  Filled 2017-06-09: qty 2

## 2017-06-09 MED ORDER — VANCOMYCIN HCL IN DEXTROSE 750-5 MG/150ML-% IV SOLN: 750.0000 mg | INTRAVENOUS | 0 refills | Status: DC

## 2017-06-09 MED ORDER — HALOPERIDOL 2 MG PO TABS: 2.0000 mg | ORAL_TABLET | Freq: Three times a day (TID) | ORAL | 0 refills | Status: AC | PRN

## 2017-06-09 MED ORDER — LORAZEPAM 2 MG/ML PO CONC: 1.0000 mg | Freq: Three times a day (TID) | ORAL | 0 refills | Status: AC

## 2017-06-09 MED ORDER — HALOPERIDOL 2 MG PO TABS: 2.0000 mg | ORAL_TABLET | Freq: Three times a day (TID) | ORAL | 0 refills | Status: DC | PRN

## 2017-06-09 MED ORDER — HYDRALAZINE HCL 25 MG PO TABS: 25.0000 mg | ORAL_TABLET | Freq: Three times a day (TID) | ORAL | 0 refills | Status: DC

## 2017-06-09 MED ORDER — VANCOMYCIN HCL IN DEXTROSE 750-5 MG/150ML-% IV SOLN
INTRAVENOUS | Status: AC
  Filled 2017-06-09: qty 150

## 2017-06-09 MED ORDER — OXYCODONE HCL 5 MG/5ML PO SOLN: 5.0000 mg | Freq: Four times a day (QID) | ORAL | 0 refills | Status: AC | PRN

## 2017-06-09 NOTE — Clinical Social Work Note (Signed)
CSW facilitated patient discharge including contacting patient family and facility to confirm patient discharge plans. Clinical information faxed to facility and family agreeable with plan. CSW arranged ambulance transport via [facility] to [name]. RN to call report prior to discharge ([phone]).  CSW will sign off for now as social work intervention is no longer needed. Please consult us again if new needs arise.  Jamae Tison, CSW [phone]  

## 2017-06-09 NOTE — Procedures (Signed)
Patient seen and examined on Hemodialysis. QB 400 L AVF, UF goal 2L.  BP improving with UF.  Treatment adjusted as needed.  Madelon Lips MD [facility] pgr [phone] 9:06 AM

## 2017-06-09 NOTE — Progress Notes (Signed)
Patient going home on hospice. Patient and family  Requests to remove telemetry. Telemetry removed.

## 2017-06-09 NOTE — Clinical Social Work Placement (Signed)
   CLINICAL SOCIAL WORK PLACEMENT  NOTE  Date:  06/09/2017  Patient Details  Name: [name] [name] MRN: [id number] Date of Birth: September 09, 1941  Clinical Social Work is seeking post-discharge placement for this patient at the [place] level of care (*CSW will initial, date and re-position this form in  chart as items are completed):  Yes   Patient/family provided with [facility] Work Department's list of facilities offering this level of care within the geographic area requested by the patient (or if unable, by the patient's family).  Yes   Patient/family informed of their freedom to choose among providers that offer the needed level of care, that participate in Medicare, Medicaid or managed care program needed by the patient, have an available bed and are willing to accept the patient.  Yes   Patient/family informed of [facility]'s ownership interest in [facility] and [facility], as well as of the fact that they are under no obligation to receive care at these facilities.  PASRR submitted to EDS on       PASRR number received on       Existing PASRR number confirmed on 06/07/17     FL2 transmitted to all facilities in geographic area requested by pt/family on 06/07/17     FL2 transmitted to all facilities within larger geographic area on       Patient informed that his/her managed care company has contracts with or will negotiate with certain facilities, including the following:        Yes   Patient/family informed of bed offers received.  Patient chooses bed at [facility]     Physician recommends and patient chooses bed at      Patient to be transferred to [facility] on 06/09/17.  Patient to be transferred to facility by [facility]     Patient family notified on 06/09/17 of transfer.  Name of family member notified:  Winn [place] and daughter-in-law     PHYSICIAN       Additional Comment:     _______________________________________________ Candie Chroman, LCSW 06/09/2017, 3:33 PM

## 2017-06-09 NOTE — Progress Notes (Signed)
Spoke to Posey Pronto, MD to make him aware of need for DC scripts. Spoke with bedside RN to update her on discharge plans and have her recall [facility] for transport. Called [name] to update her and offer support.

## 2017-06-09 NOTE — Progress Notes (Signed)
Daily Progress Note   Patient Name: [name] [name]       Date: 06/09/2017 DOB: Oct 16, 1941  Age: 76 y.o. MRN#: [id number] Attending Physician: Lavina Hamman, MD Primary Care Physician: Theodis Aguas Andree Elk, NP Admit Date: 06/06/2017  Reason for Consultation/Follow-up: Establishing goals of care and Psychosocial/spiritual support  Subjective: Spoke to pt's wife this  morning who asked that Dr. Posey Pronto and myself begin discussion re: stopping HD. Did confer with SW that [name] can take pt back and transport to HD using a hoyer lift. Pt sates he is aware that he has a limited life expectancy but wants to continue HD for now. Also presented him with option of going home with hospice for EOL care. Informed pt that [rognois when stopping HD is typically 2 weeks. He verbalized understanding He also states he is ready whenever it's his time  Length of Stay: 3  Current Medications: Scheduled Meds:  . acidophilus  1 capsule Oral Daily  . aspirin EC  81 mg Oral QHS  . Chlorhexidine Gluconate Cloth  6 each Topical Q0600  . cholecalciferol  1,000 Units Oral Daily  . cycloSPORINE modified  75 mg Oral Daily   And  . cycloSPORINE modified  50 mg Oral QHS  . darbepoetin (ARANESP) injection - DIALYSIS  200 mcg Intravenous Q Thu-HD  . donepezil  10 mg Oral QHS  . feeding supplement (NEPRO CARB STEADY)  237 mL Oral BID BM  . FLUoxetine  20 mg Oral Daily  . heparin  5,000 Units Subcutaneous Q8H  . hydrALAZINE  25 mg Oral Q8H  . insulin aspart  0-9 Units Subcutaneous TID WC  . levothyroxine  75 mcg Oral QAC breakfast  . metoprolol tartrate  50 mg Oral BID  . multivitamin  1 tablet Oral QHS  . mupirocin ointment  1 application Nasal BID  . mycophenolate  500 mg Oral Once per day on Sun Mon Wed Fri   And  . mycophenolate  250 mg Oral Once per day on Sun Mon Wed Fri  . mycophenolate  500 mg Oral 2 times per day on Tue Thu Sat  . pantoprazole  40 mg Oral Daily  . pravastatin  10 mg Oral QHS  . vitamin B-12  1,000 mcg Oral Q breakfast    Continuous Infusions: . sodium chloride    .  sodium chloride    . vancomycin Stopped (06/09/17 1156)    PRN Meds: sodium chloride, sodium chloride, acetaminophen **OR** acetaminophen, albuterol, diphenoxylate-atropine, hydrALAZINE, lidocaine (PF), lidocaine-prilocaine, ondansetron **OR** ondansetron (ZOFRAN) IV, pentafluoroprop-tetrafluoroeth, senna-docusate, traZODone  Physical Exam  Constitutional: He is oriented to person, place, and time.  Acutely ill appearing older man in no acute distress  HENT:  Head: Normocephalic and atraumatic.  Neck: Normal range of motion.  Cardiovascular: Normal rate.   Pulmonary/Chest: Effort normal.  Musculoskeletal: Normal range of motion.  Neurological: He is alert and oriented to person, place, and time.  Very HOH  Skin: Skin is warm and dry. There is pallor.  Psychiatric: He has a normal mood and affect. His behavior is normal. Judgment and thought content normal.  Nursing note and vitals reviewed.           Vital Signs: BP 140/66 (BP Location: Right Arm)   Pulse 67   Temp 98.2 F (36.8 C) (Oral)   Resp 18   Ht 5\' 5"  (1.651 m)   Wt 80 kg (176 lb 5.9 oz)   SpO2 100%   BMI 29.35 kg/m  SpO2: SpO2: 100 % O2 Device: O2 Device: Not Delivered O2 Flow Rate:    Intake/output summary:  Intake/Output Summary (Last 24 hours) at 06/09/17 1402 Last data filed at 06/09/17 1151  Gross per 24 hour  Intake              120 ml  Output             1500 ml  Net            -1380 ml   LBM: Last BM Date: 06/06/17 Baseline Weight: Weight: 81.5 kg (179 lb 10.8 oz) Most recent weight: Weight: 80 kg (176 lb 5.9 oz)       Palliative Assessment/Data:    Flowsheet Rows     Most Recent Value  Intake Tab    Referral Department  Hospitalist  Unit at Time of Referral  Med/Surg Unit  Palliative Care Primary Diagnosis  Nephrology  Date Notified  06/07/17  Palliative Care Type  New Palliative care  Reason for referral  Clarify Goals of Care  Date of Admission  06/06/17  Date first seen by Palliative Care  06/08/17  # of days Palliative referral response time  1 Day(s)  # of days IP prior to Palliative referral  1  Clinical Assessment  Palliative Performance Scale Score  40%  Pain Max last 24 hours  Not able to report  Pain Min Last 24 hours  Not able to report  Dyspnea Max Last 24 Hours  Not able to report  Dyspnea Min Last 24 hours  Not able to report  Nausea Max Last 24 Hours  Not able to report  Nausea Min Last 24 Hours  Not able to report  Anxiety Max Last 24 Hours  Not able to report  Anxiety Min Last 24 Hours  Not able to report  Other Max Last 24 Hours  Not able to report  Psychosocial & Spiritual Assessment  Palliative Care Outcomes  Patient/Family meeting held?  Yes  Who was at the meeting?  pt,  LM for spouse  Palliative Care follow-up planned  Yes, Facility      Patient Active Problem List   Diagnosis Date Noted  . Palliative care by specialist   . Pulmonary edema 06/06/2017  . Coronary artery disease 05/19/2017  . MRSA infection   . Bacteremia   .  Pressure injury of skin 05/02/2017  . Cavitary lesion of lung   . HCAP (healthcare-associated pneumonia) 05/01/2017  . Malnutrition of moderate degree 04/25/2017  . Streptococcal bacteremia 04/22/2017  . Acute metabolic encephalopathy 36/64/4034  . Volume overload 02/03/2017  . Generalized weakness 08/22/2016  . Urinary tract infection   . Syncope and collapse   . Syncope 08/07/2016  . Faintness   . Uncontrolled hypertension   . Acute blood loss anemia   . Cardiomyopathy ([place])   . Controlled diabetes mellitus type 2 with complications ([place])   . 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 Traumatic subdural  hematoma ([place]) 05/04/2016  . 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 Subdural hematoma ([place]) 05/02/2016  . CKD (chronic kidney disease)   . Protein-calorie malnutrition, severe ([place]) 04/23/2015  . Anemia of chronic disease 04/23/2015  . Depression 04/23/2015  . UTI (urinary tract infection) 04/22/2015  . Barrett's esophagus 03/22/2015  . ESRD on dialysis ([place]) 10/12/2014  . DM (diabetes mellitus), type 2 with renal complications ([place]) 74/25/9563  . Hypothyroidism 10/06/2014  . Heart transplant, orthotopic, status ([place])   . Hypertensive heart disease   . Hypercholesterolemia 06/20/2014  . 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 He was found to have acute, pulmonary edema, and emergency hemodialysis was indicated. Patient has had multiple hospitalizations over the past 6 months. He has recently required skilled nursing care for rehabilitation. He is extremely weak and at this point, per chart review, is requiring a Hoyer lift to move from the wheelchair to a regular chair. Consult is for goals of care, and to discuss if he wants to cont HD   Recommendations/Plan: Pt now DNR He recognizes that he is nearing EOL but is not ready to stop HD DC to [facility] out pt palliative care consult to come to the facility. Will ask PMT to arrange referral  Goals of Care and Additional Recommendations:  Limitations on Scope of Treatment: Full Scope Treatment except for DNR/DNI  Code Status:    Code Status Orders        Start     Ordered   06/08/17  8756  Do not attempt resuscitation (DNR)  Continuous    Question Answer Comment  In the event of cardiac or respiratory ARREST Do not call a "code blue"   In the event of cardiac or respiratory ARREST Do not perform Intubation, CPR, defibrillation or ACLS   In the event of cardiac or respiratory ARREST Use medication by any route, position, wound care, and other measures to relive pain and suffering. May use oxygen, suction and manual treatment of airway obstruction as needed for comfort.      06/08/17 1658    Code Status History    Date Active Date Inactive Code Status Order ID Comments User Context   06/06/2017 11:47 PM 06/08/2017  4:58 PM Full Code [id number]  Murlean Iba, MD Inpatient   05/01/2017  5:40 PM 05/09/2017 12:37 AM Full Code [id number]  Reyne Dumas, MD ED   04/22/2017  7:29 AM 04/26/2017  8:16 PM Full Code [id number]  Reubin Milan,  MD Inpatient   02/03/2017  5:14 PM 02/07/2017 12:08 AM Full Code [id number]  Donne Hazel, MD ED   09/06/2016 12:53 PM 09/06/2016  7:03 PM Full Code [id number]  Waynetta Sandy, MD Inpatient   08/22/2016  8:00 PM 08/24/2016 10:36 PM Full Code [id number]  Samella Parr, NP Inpatient   08/07/2016  5:30 PM 08/09/2016  9:48 PM Full Code [id number]  Willia Craze, NP ED   05/02/2016  6:37 PM 05/10/2016  7:40 PM Full Code [id number]  Reyne Dumas, MD ED   04/22/2015 10:25 PM 04/27/2015  5:15 PM Full Code [id number]  Etta Quill, DO ED   12/27/2014  4:42 AM 12/27/2014  5:13 PM Full Code [id number]  Jani Gravel, MD ED   10/05/2014  4:49 PM 10/14/2014  7:35 PM Full Code [id number]  Donne Hazel, MD ED   07/06/2014  9:47 PM 07/10/2014  4:52 PM Full Code [id number]  Rise Patience, MD Inpatient   06/20/2014  9:37 PM 06/25/2014  4:59 PM Partial Code [id number]  Marcelle Smiling, MD Inpatient    Advance Directive Documentation     Most Recent Value  Type of Advance Directive  Out of facility DNR (pink MOST or yellow form)  Pre-existing out of facility  DNR order (yellow form or pink MOST form)  -  "MOST" Form in Place?  -       Prognosis:   < 6 months in the setting of ESRD on HD, sharp functional decline. Pt would meet hospice criteria when he elects to stop HD  Discharge Planning:  [place] for rehab with Palliative care service follow-up  Care plan was discussed with Dr. Posey Pronto  Thank you for allowing the Palliative Medicine Team to assist in the care of this patient.   Time In: 1345 Time Out: 1430 Total Time 45 min Prolonged Time Billed  no       Greater than 50%  of this time was spent counseling and coordinating care related to the above assessment and plan.  Dory Horn, NP  Please contact Palliative Medicine Team phone at [phone] for questions and concerns.

## 2017-06-09 NOTE — Clinical Social Work Note (Signed)
Patient has decided to discharge home with hospice. RNCM aware. PTAR has been cancelled. CSW left message for admissions coordinator at [facility].  Dayton Scrape, [place]

## 2017-06-09 NOTE — Progress Notes (Signed)
Patient had discussion with spouse and daughter. Patient has decided to go home on Hospice and Not to a SNF. MD updated and Social Work updated.  Sheliah Plane RN

## 2017-06-09 NOTE — Progress Notes (Signed)
[facility] Progress Note   Subjective:  Lying in bed, on HD.  Denies pain.    Objective Vitals:   06/09/17 0700 06/09/17 0751 06/09/17 0756 06/09/17 0830  BP:  (!) 197/77 (!) 190/78 (!) 157/71  Pulse:  64 64 64  Resp:  18    Temp:  98.2 F (36.8 C)    TempSrc:  Oral    SpO2:  100%    Weight:  81.4 kg (179 lb 7.3 oz)    Height: 5\' 5"  (1.651 m)      Physical Exam General: Pleasant, frail appearing. NAD HEENT: very hard of hearing, no hearing aids in  Heart: RRR; no murmur Lungs: CTAB Extremities: no LE edema  Dialysis Access: LUE AVF + bruit   Additional Objective Labs: Basic Metabolic Panel:  Recent Labs Lab 06/07/17 0508 06/08/17 0531 06/09/17 0400  NA 137 134* 136  K 3.4* 3.6 4.1  CL 97* 97* 99*  CO2 29 26 27   GLUCOSE 68 114* 109*  BUN 14 7 22*  CREATININE 3.93* 2.82* 4.34*  CALCIUM 8.1* 8.5* 8.6*  PHOS 2.5 1.6* 2.1*   CBC:  Recent Labs Lab 06/06/17 1424  06/07/17 0006 06/07/17 0508 06/08/17 0531 06/09/17 0400  WBC 11.7*  --  6.8 7.5 7.9 8.0  NEUTROABS 7.6  --   --   --   --   --   HGB 10.0*  < > 9.9* 9.3* 9.5* 8.8*  HCT 32.1*  < > 31.9* 30.3* 30.7* 29.5*  MCV 98.8  --  99.1 98.7 101.0* 101.7*  PLT 319  --  281 267 253 263  < > = values in this interval not displayed.  Cardiac Enzymes:  Recent Labs Lab 06/06/17 1424  TROPONINI <0.03   CBG:  Recent Labs Lab 06/08/17 0725 06/08/17 1139 06/08/17 1648 06/08/17 2034 06/09/17 0736  GLUCAP 113* 141* 214* 162* 93   Studies/Results: No results found. Medications: . sodium chloride    . sodium chloride    . vancomycin 750 mg (06/07/17 1729)   . acidophilus  1 capsule Oral Daily  . aspirin EC  81 mg Oral QHS  . Chlorhexidine Gluconate Cloth  6 each Topical Q0600  . cholecalciferol  1,000 Units Oral Daily  . cycloSPORINE modified  75 mg Oral Daily   And  . cycloSPORINE modified  50 mg Oral QHS  . darbepoetin (ARANESP) injection - DIALYSIS  200 mcg Intravenous Q  Thu-HD  . donepezil  10 mg Oral QHS  . feeding supplement (NEPRO CARB STEADY)  237 mL Oral BID BM  . FLUoxetine  20 mg Oral Daily  . heparin  5,000 Units Subcutaneous Q8H  . hydrALAZINE  25 mg Oral Q8H  . insulin aspart  0-9 Units Subcutaneous TID WC  . levothyroxine  75 mcg Oral QAC breakfast  . metoprolol tartrate  50 mg Oral BID  . multivitamin  1 tablet Oral QHS  . mupirocin ointment  1 application Nasal BID  . mycophenolate  500 mg Oral Once per day on Sun Mon Wed Fri   And  . mycophenolate  250 mg Oral Once per day on Sun Mon Wed Fri  . mycophenolate  500 mg Oral 2 times per day on Tue Thu Sat  . pantoprazole  40 mg Oral Daily  . pravastatin  10 mg Oral QHS  . vitamin B-12  1,000 mcg Oral Q breakfast    Dialysis Orders: [facility] TTS EDW 64.5  HD 4 hrs Bath  2K 2.25 Ca, no heparin, L AVF - Vancomycin 500 mg q HD until 7/23 - Mircera 225 mcg q 2 weeks (ordered, but never given). Last given 241mcg on 7/10. - No VDRA/sensipar.  Assessment/Plan: 1. Weakness: Ongoing issue. Wife is experiencing caregiver fatigue, may need placement after d/c as she states that she can't take care of him at home. 2. ESRD: S/p HD 7/18 and 7/19, now back to TTS schedule, next 7/21. 3K bath. 3. HTN/volume: BP still high and over EDW (?wt error), UF as tolerated with HD.  Home med list shows metoprolol 50mg  BID and hydralazine 25mg  TID-> will resume the hydralazine, BP still elevated, expect it to get better somewhat with UF 4. Anemia: Hgb 9.5, Aranesp 223mcg weekly resumed 7/19. 5. Secondary hyperparathyroidism:  Ca ok, Phos low. Will hold Phoslo for now. Remains on Vit D3 1000 daily.  6. Nutrition:  Continue Nepro.--> RD to consult, OK to liberalize diet to regular 7. Recent MRSA pneumonia: Has been getting Vancomycin with HD (end date 7/23), continue. 8. Hx heart transplant (2001): Continue CellCept and Cyclosporine. 9: R forearm wound: Wound care RN recommended surgical biopsy. 10: Diarrhea:  Chronic issue, C.diff 7/19 negative. 11: DM II: per primary 12: Dispo: Consider SNF. Palliative care consult may be appropriate (wife and patient are ambivalent about stopping HD).  Madelon Lips MD [facility] pgr [phone] 06/09/2017, 8:59 AM

## 2017-06-09 NOTE — Discharge Summary (Addendum)
[facility] Discharge Summary   Patient: [name] [name] MBT:[id number]   PCP: Junie Panning, NP DOB: 05/02/1941   Date of admission: 06/06/2017   Date of discharge:  06/09/2017    Discharge Diagnoses:  Principal Problem:   Pulmonary edema Active Problems:   Hypercholesterolemia   Heart transplant, orthotopic, status ([place])   Hypertensive heart disease   Hypothyroidism   ESRD on dialysis ([place])   Protein-calorie malnutrition, severe ([place])   Controlled diabetes mellitus type 2 with complications ([place])   Cardiomyopathy ([place])   Uncontrolled hypertension   Generalized weakness   Volume overload   Pressure injury of skin   Coronary artery disease   Palliative care by specialist  Admitted From: home Disposition:  SNF  Recommendations for Outpatient Follow-up:  1. Discontinue hemodialysis as scheduled. 2. The duration of the vancomycin has been increased from end date 06/11/2017 to 06/16/2017   Contact information for follow-up providers    Smothers, Andree Elk, NP. Schedule an appointment as soon as possible for a visit in 1 week(s).   Specialty:  Nurse Practitioner Contact information: [address] [place] [address] [phone]            Contact information for after-discharge care    Destination    [facility] Follow up.   Specialty:  Skilled Nursing Facility Contact information: [address] [place] [phone]                 Diet recommendation: Regular diet, low potassium due to severe protein calorie malnutrition and ESRD  Activity: The patient is advised to gradually reintroduce usual activities.  Discharge Condition: good  Code Status: DNR/DNI  History of present illness: As per the H and P dictated on admission, "[name] [name] is a 76 y.o. male with ESRD on hemodialysis TTS, CAD, DM, HTN, s/p heart transplant (2001) was recently discharged from [facility]  for acute rehab treatment.  Apparently he had been very weak over there and had not been ambulating well.  Unfortunately after arriving home his family has had a difficult time getting him out of bed so that they could take him to his dialysis treatments.  They are saying that patient has become bed bound and unable to stand.  His legs are very weak.  They can't get him into a car.  He has missed several dialysis treatments.  His last HD treatment was 7/12.  They are unable to afford to have ambulance transport for his hemodialysis treatments and cannot afford an hourly caregiver.  His wife is ill with heart failure and had recent MI.  They do not qualify for medicaid benefits.    He was recently hospitalized and treated for a MRSA cavitary pneumonia and he has been on vancomycin given during HD treatments.  He was supposed to take vancomycin thru 06/11/17 during HD.  His last vanc dose was 7/12.  Upon review of records, TB and fungal infection was ruled out after he had a bronchoscopy done during the last admission.  He had been doing fairly well except for his chronic frequent diarrhea, poor appetite, poor oral consumption which has increased in the past couple of days.  He denies chest pain and shortness of breath.  "  Hospital Course:  Summary of his active problems in the hospital is as following. 1. Acute pulmonary edema , noncardiogenic due to volume overload. - S/P emergent HD. Repeat HD today. Continue HD as  scheduled TTS.  Improvement in oxygenation as well as mentation with treatment.  2. ESRD on hemodialysis, acute metabolic toxic encephalopathy -missed severe HD due to weakness, presented confusion, after HD better. continue scheduled HD TTS, discussed with patient in detail, he wants to continue HD for now at [facility], he is happy with that facility. He has said that "he is also ready to go when the time comes." 3. Anemia of CKD - stable, will follow.  4. Leukocytosis - missed several doses  of vancomycin, last dose was 7/12. Most likely stress reaction. 5. MRSA cavitary pneumonia - Pt has missed some doses. He had been scheduled to complete course on 06/11/17. Will extend the course for 1 week to ensure he has adequate coverage.  6. Diarrhea - this is not new, but given recent hospitalization and SNF CDiff is checked which is negative. Diarrhea better 7. Generalized weakness - this is a complicated issue, he likely is going to need long-term skilled care  8. Severe protein calorie malnutrition - longstanding problem, diateray consulted. Advance to regular diet instead of renal, maintain low potassium. 9. Wound RUE - per wife, pt had apparently been followed for this by ID [facility], inform the wife that ID will not be indicated, pt actually need to see dermatology as outpatient.  10. DM type 2 with renal complications - monitor blood glucose and provide sensitive sliding scale as needed.  11. Cardiomyopathy - Volume management as noted above with hemodialysis.  12. Uncontrolled hypertension - likely exacerbated by volume overload and missed doses of home medications. It should improve with hemodialysis. Nephrology said to restart home medications. Hydralazine added.  13. CAD - stable, no chest pain, stable EKG.  14. S/p heart transplant - resume all home anti-rejection medications  15. Right upper arm wound, full-thickness lesion. Present since last few months and progressively worsening. 3 x 3 x 5 cm. Outpatient dermatology referral recommended for biopsy.  Goals of care discussion. Extensive discussion with the patient in presence of palliative care regarding his goals of care. Initially the patient requested that he would like to go to a nursing home and continued hemodialysis. SNF discharge was arranged. After discussion with family and daughter and wife the patient decided to go home with hospice and stop hemodialysis. Hospice was consulted. Prescriptions for oxycodone and  lorazepam and Haldol were provided to the family. Morphine was selected due to ESRD status.  For hospice and case management patient had all the other equipments at home.  All other chronic medical condition were stable during the hospitalization.  Initial plan was to discharge at SNF but later on Patient Changed His Mind and Patient Is Currently Being Discharged to Home with Home Hospice.  Procedures and Results:  Hemodialysis   Consultations:  Nephrology  Palliative care  DISCHARGE MEDICATION: Current Discharge Medication List    START taking these medications   Details  haloperidol (HALDOL) 2 MG tablet Take 1 tablet (2 mg total) by mouth every 8 (eight) hours as needed for agitation. Qty: 15 tablet, Refills: 0    hydrALAZINE (APRESOLINE) 25 MG tablet Take 1 tablet (25 mg total) by mouth every 8 (eight) hours. Qty: 90 tablet, Refills: 0    LORazepam (LORAZEPAM INTENSOL) 2 MG/ML concentrated solution Take 0.5 mLs (1 mg total) by mouth every 8 (eight) hours. Qty: 30 mL, Refills: 0    oxyCODONE (ROXICODONE) 5 MG/5ML solution Take 5 mLs (5 mg total) by mouth every 6 (six) hours as needed for severe pain (  dyspnoea). Qty: 40 mL, Refills: 0      CONTINUE these medications which have NOT CHANGED   Details  acetaminophen (TYLENOL) 325 MG tablet Take 2 tablets (650 mg total) by mouth every 6 (six) hours as needed for mild pain (or Fever >/= 101).    cycloSPORINE modified (NEORAL) 25 MG capsule Take 50-75 mg by mouth See admin instructions. 75 mg in the morning and 50 mg in the evening    diphenoxylate-atropine (LOMOTIL) 2.5-0.025 MG tablet Take 1 tablet by mouth 4 (four) times daily as needed for diarrhea or loose stools.    FLUoxetine (PROZAC) 20 MG capsule Take 20 mg by mouth daily.     lansoprazole (PREVACID) 30 MG capsule Take 30 mg by mouth 4 (four) times a week. Sunday, Monday, Wednesday, Friday    levothyroxine (SYNTHROID, LEVOTHROID) 75 MCG tablet Take 75 mcg by mouth  daily.     metoprolol tartrate (LOPRESSOR) 25 MG tablet Take 2 tablets (50 mg total) by mouth 2 (two) times daily. Qty: 120 tablet, Refills: 0    multivitamin (RENA-VIT) TABS tablet Take 1 tablet by mouth daily.  Refills: 5    mycophenolate (CELLCEPT) 250 MG capsule Take 250-500 mg by mouth daily. 500mg  in am, 250mg  in pm, except for Tuesday, Thursday and Saturdays pt takes 500mg  twice daily    Nutritional Supplements (FEEDING SUPPLEMENT, NEPRO CARB STEADY,) LIQD Take 237 mLs by mouth 2 (two) times daily between meals. Refills: 0    pravastatin (PRAVACHOL) 10 MG tablet Take 10 mg by mouth at bedtime.     Probiotic Product (PRO-BIOTIC BLEND) CAPS Take 1 capsule by mouth daily.    vitamin B-12 (CYANOCOBALAMIN) 1000 MCG tablet Take 1,000 mcg by mouth daily with breakfast.      STOP taking these medications     aspirin EC 81 MG tablet      calcium acetate (PHOSLO) 667 MG capsule      cholecalciferol (VITAMIN D) 1000 UNITS tablet      donepezil (ARICEPT) 10 MG tablet        Allergies  Allergen Reactions  . Norvasc [Amlodipine Besylate] Swelling and Other (See Comments)    Swelling of the lower body    Discharge Instructions    Ambulatory referral to Dermatology    Complete by:  As directed    Diet general    Complete by:  As directed    Low potassium.   Discharge instructions    Complete by:  As directed    It is important that you read following instructions as well as go over your medication list with RN to help you understand your care after this hospitalization.  Discharge Instructions: Please follow-up with PCP in one week  Please request your primary care physician to go over all Hospital Tests and Procedure/Radiological results at the follow up,  Please get all Hospital records sent to your PCP by signing hospital release before you go home.   Do not take more than prescribed Pain, Sleep and Anxiety Medications. You were cared for by a hospitalist during your  hospital stay. If you have any questions about your discharge medications or the care you received while you were in the hospital after you are discharged, you can call the unit and ask to speak with the hospitalist on call if the hospitalist that took care of you is not available.  Once you are discharged, your primary care physician will handle any further medical issues. Please note that NO REFILLS for  any discharge medications will be authorized once you are discharged, as it is imperative that you return to your primary care physician (or establish a relationship with a primary care physician if you do not have one) for your aftercare needs so that they can reassess your need for medications and monitor your lab values. You Must read complete instructions/literature along with all the possible adverse reactions/side effects for all the Medicines you take and that have been prescribed to you. Take any new Medicines after you have completely understood and accept all the possible adverse reactions/side effects. Wear Seat belts while driving. If you have smoked or chewed Tobacco in the last 2 yrs please stop smoking and/or stop any Recreational drug use.   Increase activity slowly    Complete by:  As directed      Discharge Exam: Filed Weights   06/09/17 0409 06/09/17 0751 06/09/17 1151  Weight: 81 kg (178 lb 9.2 oz) 81.4 kg (179 lb 7.3 oz) 80 kg (176 lb 5.9 oz)   Vitals:   06/09/17 1130 06/09/17 1151  BP: 120/60 140/66  Pulse: 66 67  Resp:  18  Temp:  98.2 F (36.8 C)   General: Appear in no distress, no Rash; Oral Mucosa moist. Cardiovascular: S1 and S2 Present, aortic systolic Murmur, no JVD Respiratory: Bilateral Air entry present and Clear to Auscultation, no Crackles, no wheezes Abdomen: Bowel Sound present, Soft and no tenderness Extremities: no Pedal edema, no calf tenderness Neurology: Grossly no focal neuro deficit.  The results of significant diagnostics from this  hospitalization (including imaging, microbiology, ancillary and laboratory) are listed below for reference.    Significant Diagnostic Studies: Dg Chest 2 View  Result Date: 06/06/2017 CLINICAL DATA:  Bilateral lower extremity swelling. EXAM: CHEST  2 VIEW COMPARISON:  CT 05/01/2017 .  Chest x-ray 05/01/2017, 05/19/2017. FINDINGS: Prior CABG. Stable cardiomegaly. Ill-defined densities noted throughout both lungs again noted. These are better demonstrated by prior CT of 05/01/2017. New infiltrate in the left suprahilar region suggesting pneumonia versus asymmetric pulmonary edema noted. IMPRESSION: 1. Ill-defined densities noted throughout both lungs again noted, reference is made to prior CT of 05/01/2017. Follow-up CT can be obtained. 2. New mild left suprahilar infiltrate versus asymmetric pulmonary edema. 3.  Prior CABG.  Stable cardiomegaly. Electronically Signed   By: Marcello Moores  Register   On: 06/06/2017 15:19   Portable Chest 1 View  Result Date: 06/07/2017 CLINICAL DATA:  Volume overload EXAM: PORTABLE CHEST 1 VIEW COMPARISON:  06/06/2017 FINDINGS: Cardiac shadow is stable. Postsurgical changes are again noted. Pleural based density is again seen in the right apex stable from previous exams but decreased from a prior CT obtained in June of 2018. No new focal infiltrate or sizable effusion is seen. IMPRESSION: Stable right apical density when compared with the prior exam. No new focal abnormality is seen. Electronically Signed   By: Inez Catalina M.D.   On: 06/07/2017 08:19    Microbiology: Recent Results (from the past 240 hour(s))  C difficile quick scan w PCR reflex     Status: None   Collection Time: 06/07/17 12:24 AM  Result Value Ref Range Status   C Diff antigen NEGATIVE NEGATIVE Final   C Diff toxin NEGATIVE NEGATIVE Final   C Diff interpretation No C. difficile detected.  Final  MRSA PCR Screening     Status: Abnormal   Collection Time: 06/07/17  6:31 AM  Result Value Ref Range Status    MRSA by PCR POSITIVE (A) NEGATIVE  Final    Comment:        The GeneXpert MRSA Assay (FDA approved for NASAL specimens only), is one component of a comprehensive MRSA colonization surveillance program. It is not intended to diagnose MRSA infection nor to guide or monitor treatment for MRSA infections. RESULT CALLED TO, READ BACK BY AND VERIFIED WITH: A.ORMAND RN AT 2919 06/07/17 BY A.DAVIS      Labs: CBC:  Recent Labs Lab 06/06/17 1424 06/06/17 1447 06/07/17 0006 06/07/17 0508 06/08/17 0531 06/09/17 0400  WBC 11.7*  --  6.8 7.5 7.9 8.0  NEUTROABS 7.6  --   --   --   --   --   HGB 10.0* 11.2* 9.9* 9.3* 9.5* 8.8*  HCT 32.1* 33.0* 31.9* 30.3* 30.7* 29.5*  MCV 98.8  --  99.1 98.7 101.0* 101.7*  PLT 319  --  281 267 253 166   Basic Metabolic Panel:  Recent Labs Lab 06/06/17 1424 06/06/17 1447 06/07/17 0006 06/07/17 0508 06/08/17 0531 06/09/17 0400  NA 137 137  --  137 134* 136  K 4.7 4.7  --  3.4* 3.6 4.1  CL 98* 99*  --  97* 97* 99*  CO2 26  --   --  29 26 27   GLUCOSE 132* 130*  --  68 114* 109*  BUN 54* 51*  --  14 7 22*  CREATININE 9.14* 9.30* 3.56* 3.93* 2.82* 4.34*  CALCIUM 8.8*  --   --  8.1* 8.5* 8.6*  MG  --   --   --  1.9 1.8 2.0  PHOS  --   --   --  2.5 1.6* 2.1*   Liver Function Tests: No results for input(s): AST, ALT, ALKPHOS, BILITOT, PROT, ALBUMIN in the last 168 hours. No results for input(s): LIPASE, AMYLASE in the last 168 hours. No results for input(s): AMMONIA in the last 168 hours. Cardiac Enzymes:  Recent Labs Lab 06/06/17 1424  TROPONINI <0.03   BNP (last 3 results)  Recent Labs  06/06/17 1425  BNP 2,817.5*   CBG:  Recent Labs Lab 06/08/17 0725 06/08/17 1139 06/08/17 1648 06/08/17 2034 06/09/17 0736  GLUCAP 113* 141* 214* 162* 93   Time spent: 50 minutes  Signed:  Celene Pippins  [facility]  06/09/2017  , 2:35 PM

## 2017-06-09 NOTE — Clinical Social Work Note (Addendum)
CSW spoke with Romona Curls, NP with palliative team this morning regarding need for hoyer lift to get to HD once at [facility]. She states [name] was able to provide this at his last admission. CSW has left two messages with admissions coordinator. Waiting on call back.  Dayton Scrape, [place] [phone]  11:08 am [name] is able to accommodate patient with hoyer lift and HD. Romona Curls, NP with palliative team updated. They are meeting with patient at 1:30 pm today to discuss future HD.  Dayton Scrape, [place]

## 2017-06-09 NOTE — Clinical Social Work Note (Signed)
CSW sent discharge summary to SNF. Waiting on admissions coordinator to return call to determine when transport can be arranged.  Dayton Scrape, [place]

## 2017-06-09 NOTE — Care Management (Signed)
Consult to care management (Order [id number])  Consult  Date: 06/09/2017 Department: [facility] Released By/Authorizing: Lavina Hamman, MD (auto-released)    Lavina Hamman, MD NPI: [id number]      Patient Information   Patient Name [name] [name], [name] [name] Sex Male DOB 27-Jul-1941 SSN [id number]  Order Details   Frequency Duration Priority Order Class  Once 1 occurrence Routine Hospital Performed  Order Questions   Question Answer Comment  Reason for consult: Other (see comments)       Comments   Please arrange Home Hospice.      Process Instructions   For Ordering Provider: For skilled nursing facility placement or assisted living placement please order a consult to social work.      Order History  Inpatient  Date/Time Action Taken User Additional Information  06/09/17 1638 Release Lavina Hamman, MD (auto-released)

## 2017-06-09 NOTE — Care Management Note (Signed)
Case Management Note  Patient Details  Name: [name] [name] MRN: [id number] Date of Birth: 03/02/1941  Subjective/Objective:    Received call from [facility] that [facility] had arrived to transport pt to SNF and pt has changed mind and now wants to go home with HOSPICE. Spoke with wife, [name] who tells me she has all DME and can care for him at home until we can arrange this. CM contacted [facility], [facility]) who will be in touch with pt and spouse to make all arrangements. MD will need to write hard scripts for the interim. CM will inform.               Action/Plan:CM will sign off for now but will be available should additional discharge needs arise or disposition change.    Expected Discharge Date:  06/09/17               Expected Discharge Plan:  [place]  In-House Referral:  NA  Discharge planning Services  CM Consult  Post Acute Care Choice:    Choice offered to:  Spouse [name])  DME Arranged:  N/A [name] tells me she has all DME she "could ever need") DME Agency:  NA  HH Arranged:    [facility]  Status of Service:  Completed, signed off  If discussed at [place] of Stay Meetings, dates discussed:    Additional Comments:  Delrae Sawyers, RN 06/09/2017, 4:47 PM

## 2017-06-09 NOTE — Progress Notes (Signed)
Patient discharged to home with Hospice.  All instructions reviewed with patient and family. Scripts given to family. [facility] transported the patient with all belongings in tow.  Sheliah Plane RN

## 2017-06-10 ENCOUNTER — Encounter: Payer: Self-pay | Admitting: *Deleted

## 2017-06-10 NOTE — Progress Notes (Unsigned)
Discharge Plan: Ambulatory for Home with Hospice.

## 2017-06-13 ENCOUNTER — Inpatient Hospital Stay: Payer: Medicare HMO | Admitting: Internal Medicine

## 2017-06-17 LAB — ACID FAST CULTURE WITH REFLEXED SENSITIVITIES: ACID FAST CULTURE - AFSCU3: NEGATIVE

## 2017-06-17 LAB — ACID FAST CULTURE WITH REFLEXED SENSITIVITIES (MYCOBACTERIA): Acid Fast Culture: NEGATIVE

## 2017-07-21 DEATH — deceased

## 2017-09-04 LAB — ACID FAST SMEAR (AFB): ACID FAST SMEAR - AFSCU2: NEGATIVE

## 2018-09-16 ENCOUNTER — Encounter: Payer: Self-pay | Admitting: Gastroenterology
